# Patient Record
Sex: Female | Born: 1935
Health system: Southern US, Community
[De-identification: ages and names within clinical notes are randomized; demographics above are authoritative.]

## PROBLEM LIST (undated history)

## (undated) DIAGNOSIS — N182 Chronic kidney disease, stage 2 (mild): Secondary | ICD-10-CM

## (undated) DIAGNOSIS — M4802 Spinal stenosis, cervical region: Secondary | ICD-10-CM

## (undated) DIAGNOSIS — K219 Gastro-esophageal reflux disease without esophagitis: Secondary | ICD-10-CM

## (undated) DIAGNOSIS — Z889 Allergy status to unspecified drugs, medicaments and biological substances status: Secondary | ICD-10-CM

## (undated) DIAGNOSIS — IMO0002 Reserved for concepts with insufficient information to code with codable children: Secondary | ICD-10-CM

## (undated) DIAGNOSIS — K635 Polyp of colon: Secondary | ICD-10-CM

## (undated) DIAGNOSIS — T8859XA Other complications of anesthesia, initial encounter: Secondary | ICD-10-CM

## (undated) DIAGNOSIS — E785 Hyperlipidemia, unspecified: Secondary | ICD-10-CM

## (undated) DIAGNOSIS — G459 Transient cerebral ischemic attack, unspecified: Secondary | ICD-10-CM

## (undated) DIAGNOSIS — C4491 Basal cell carcinoma of skin, unspecified: Secondary | ICD-10-CM

## (undated) DIAGNOSIS — E119 Type 2 diabetes mellitus without complications: Secondary | ICD-10-CM

## (undated) DIAGNOSIS — I2510 Atherosclerotic heart disease of native coronary artery without angina pectoris: Secondary | ICD-10-CM

## (undated) DIAGNOSIS — T4145XA Adverse effect of unspecified anesthetic, initial encounter: Secondary | ICD-10-CM

## (undated) DIAGNOSIS — I10 Essential (primary) hypertension: Secondary | ICD-10-CM

## (undated) DIAGNOSIS — I639 Cerebral infarction, unspecified: Secondary | ICD-10-CM

## (undated) HISTORY — DX: Atherosclerotic heart disease of native coronary artery without angina pectoris: I25.10

## (undated) HISTORY — PX: TUBAL LIGATION: SHX77

## (undated) HISTORY — PX: BASAL CELL CARCINOMA EXCISION: SHX1214

## (undated) HISTORY — DX: Allergy status to unspecified drugs, medicaments and biological substances: Z88.9

## (undated) HISTORY — DX: Hyperlipidemia, unspecified: E78.5

## (undated) HISTORY — DX: Spinal stenosis, cervical region: M48.02

## (undated) HISTORY — PX: APPENDECTOMY: SHX54

## (undated) HISTORY — PX: SQUAMOUS CELL CARCINOMA EXCISION: SHX2433

## (undated) HISTORY — DX: Polyp of colon: K63.5

## (undated) HISTORY — PX: MOHS SURGERY: SUR867

## (undated) HISTORY — DX: Gastro-esophageal reflux disease without esophagitis: K21.9

## (undated) HISTORY — DX: Essential (primary) hypertension: I10

---

## 1990-07-07 HISTORY — PX: BREAST BIOPSY: SHX20

## 1999-11-05 HISTORY — PX: OTHER SURGICAL HISTORY: SHX169

## 2000-03-03 ENCOUNTER — Encounter: Payer: Self-pay | Admitting: Family Medicine

## 2000-03-03 ENCOUNTER — Encounter: Admission: RE | Admit: 2000-03-03 | Discharge: 2000-03-03 | Payer: Self-pay | Admitting: Family Medicine

## 2000-03-07 HISTORY — PX: CYSTOSCOPY W/ DECANNULATION: SHX1423

## 2001-04-06 ENCOUNTER — Encounter: Payer: Self-pay | Admitting: Family Medicine

## 2001-04-06 ENCOUNTER — Encounter: Admission: RE | Admit: 2001-04-06 | Discharge: 2001-04-06 | Payer: Self-pay | Admitting: Family Medicine

## 2001-06-06 ENCOUNTER — Encounter: Payer: Self-pay | Admitting: Family Medicine

## 2001-06-06 LAB — CONVERTED CEMR LAB

## 2001-06-07 ENCOUNTER — Other Ambulatory Visit: Admission: RE | Admit: 2001-06-07 | Discharge: 2001-06-07 | Payer: Self-pay | Admitting: Family Medicine

## 2001-06-25 LAB — FECAL OCCULT BLOOD, GUAIAC: Fecal Occult Blood: NEGATIVE

## 2002-03-14 ENCOUNTER — Ambulatory Visit (HOSPITAL_COMMUNITY): Admission: RE | Admit: 2002-03-14 | Discharge: 2002-03-14 | Payer: Self-pay | Admitting: Family Medicine

## 2002-05-25 ENCOUNTER — Encounter: Admission: RE | Admit: 2002-05-25 | Discharge: 2002-05-25 | Payer: Self-pay | Admitting: Family Medicine

## 2002-05-25 ENCOUNTER — Encounter: Payer: Self-pay | Admitting: Family Medicine

## 2003-07-08 HISTORY — PX: OTHER SURGICAL HISTORY: SHX169

## 2003-07-08 HISTORY — PX: TEAR DUCT PROBING: SHX793

## 2003-08-14 ENCOUNTER — Encounter: Admission: RE | Admit: 2003-08-14 | Discharge: 2003-08-14 | Payer: Self-pay | Admitting: Family Medicine

## 2004-06-11 ENCOUNTER — Ambulatory Visit: Payer: Self-pay | Admitting: Family Medicine

## 2004-07-10 ENCOUNTER — Ambulatory Visit: Payer: Self-pay | Admitting: Family Medicine

## 2004-09-11 ENCOUNTER — Encounter: Admission: RE | Admit: 2004-09-11 | Discharge: 2004-09-11 | Payer: Self-pay | Admitting: Family Medicine

## 2004-12-19 ENCOUNTER — Encounter: Admission: RE | Admit: 2004-12-19 | Discharge: 2004-12-19 | Payer: Self-pay | Admitting: Otolaryngology

## 2005-01-04 HISTORY — PX: NASAL SINUS SURGERY: SHX719

## 2005-01-09 ENCOUNTER — Ambulatory Visit: Payer: Self-pay | Admitting: Family Medicine

## 2005-01-10 ENCOUNTER — Ambulatory Visit (HOSPITAL_BASED_OUTPATIENT_CLINIC_OR_DEPARTMENT_OTHER): Admission: RE | Admit: 2005-01-10 | Discharge: 2005-01-10 | Payer: Self-pay | Admitting: Otolaryngology

## 2005-01-10 ENCOUNTER — Ambulatory Visit (HOSPITAL_COMMUNITY): Admission: RE | Admit: 2005-01-10 | Discharge: 2005-01-10 | Payer: Self-pay | Admitting: Otolaryngology

## 2005-01-10 ENCOUNTER — Encounter (INDEPENDENT_AMBULATORY_CARE_PROVIDER_SITE_OTHER): Payer: Self-pay | Admitting: *Deleted

## 2005-01-16 ENCOUNTER — Ambulatory Visit: Payer: Self-pay | Admitting: Family Medicine

## 2005-04-22 ENCOUNTER — Ambulatory Visit: Payer: Self-pay | Admitting: Family Medicine

## 2005-04-24 ENCOUNTER — Ambulatory Visit: Payer: Self-pay | Admitting: Family Medicine

## 2005-07-24 ENCOUNTER — Ambulatory Visit: Payer: Self-pay | Admitting: Family Medicine

## 2005-07-29 ENCOUNTER — Ambulatory Visit: Payer: Self-pay | Admitting: Family Medicine

## 2005-08-11 ENCOUNTER — Ambulatory Visit: Payer: Self-pay | Admitting: Family Medicine

## 2005-08-13 ENCOUNTER — Encounter: Admission: RE | Admit: 2005-08-13 | Discharge: 2005-08-13 | Payer: Self-pay | Admitting: Otolaryngology

## 2005-08-26 ENCOUNTER — Ambulatory Visit (HOSPITAL_BASED_OUTPATIENT_CLINIC_OR_DEPARTMENT_OTHER): Admission: RE | Admit: 2005-08-26 | Discharge: 2005-08-26 | Payer: Self-pay | Admitting: Otolaryngology

## 2005-08-26 ENCOUNTER — Encounter (INDEPENDENT_AMBULATORY_CARE_PROVIDER_SITE_OTHER): Payer: Self-pay | Admitting: Specialist

## 2005-09-04 ENCOUNTER — Ambulatory Visit: Payer: Self-pay | Admitting: Family Medicine

## 2005-09-09 ENCOUNTER — Ambulatory Visit: Payer: Self-pay | Admitting: Family Medicine

## 2005-09-09 ENCOUNTER — Encounter: Admission: RE | Admit: 2005-09-09 | Discharge: 2005-09-09 | Payer: Self-pay | Admitting: Family Medicine

## 2005-09-22 ENCOUNTER — Ambulatory Visit: Payer: Self-pay | Admitting: Family Medicine

## 2005-09-23 ENCOUNTER — Ambulatory Visit: Payer: Self-pay | Admitting: Family Medicine

## 2005-10-01 ENCOUNTER — Encounter: Admission: RE | Admit: 2005-10-01 | Discharge: 2005-10-01 | Payer: Self-pay | Admitting: General Surgery

## 2005-10-05 ENCOUNTER — Ambulatory Visit: Payer: Self-pay | Admitting: Family Medicine

## 2005-11-04 ENCOUNTER — Ambulatory Visit: Payer: Self-pay | Admitting: Family Medicine

## 2005-11-04 ENCOUNTER — Encounter: Admission: RE | Admit: 2005-11-04 | Discharge: 2005-11-04 | Payer: Self-pay | Admitting: Family Medicine

## 2005-11-10 ENCOUNTER — Ambulatory Visit: Payer: Self-pay | Admitting: Family Medicine

## 2006-02-13 ENCOUNTER — Ambulatory Visit: Payer: Self-pay | Admitting: Family Medicine

## 2006-06-10 ENCOUNTER — Ambulatory Visit: Payer: Self-pay | Admitting: Family Medicine

## 2006-08-18 ENCOUNTER — Ambulatory Visit: Payer: Self-pay | Admitting: Family Medicine

## 2006-10-09 ENCOUNTER — Encounter: Payer: Self-pay | Admitting: Family Medicine

## 2006-10-09 DIAGNOSIS — K219 Gastro-esophageal reflux disease without esophagitis: Secondary | ICD-10-CM | POA: Insufficient documentation

## 2006-10-09 DIAGNOSIS — N3941 Urge incontinence: Secondary | ICD-10-CM | POA: Insufficient documentation

## 2006-10-09 DIAGNOSIS — N318 Other neuromuscular dysfunction of bladder: Secondary | ICD-10-CM | POA: Insufficient documentation

## 2006-10-09 DIAGNOSIS — Z85828 Personal history of other malignant neoplasm of skin: Secondary | ICD-10-CM | POA: Insufficient documentation

## 2006-10-16 ENCOUNTER — Ambulatory Visit: Payer: Self-pay | Admitting: Family Medicine

## 2006-10-16 LAB — CONVERTED CEMR LAB
ALT: 16 units/L (ref 0–40)
AST: 18 units/L (ref 0–37)
Cholesterol: 196 mg/dL (ref 0–200)
Creatinine,U: 235 mg/dL
HDL: 38.3 mg/dL — ABNORMAL LOW (ref >39.0)
Hgb A1c MFr Bld: 6.8 % — ABNORMAL HIGH (ref 4.6–6.0)
LDL Cholesterol: 131 mg/dL — ABNORMAL HIGH (ref 0–99)
Microalb Creat Ratio: 3.4 mg/g (ref 0.0–30.0)
Microalb, Ur: 0.8 mg/dL (ref 0.0–1.9)
Total CHOL/HDL Ratio: 5.1
Triglycerides: 133 mg/dL (ref 0–149)
VLDL: 27 mg/dL (ref 0–40)

## 2006-11-18 ENCOUNTER — Encounter: Admission: RE | Admit: 2006-11-18 | Discharge: 2006-11-18 | Payer: Self-pay | Admitting: Family Medicine

## 2006-11-20 ENCOUNTER — Encounter (INDEPENDENT_AMBULATORY_CARE_PROVIDER_SITE_OTHER): Payer: Self-pay | Admitting: *Deleted

## 2006-11-24 ENCOUNTER — Ambulatory Visit: Payer: Self-pay | Admitting: Family Medicine

## 2006-11-24 DIAGNOSIS — E1169 Type 2 diabetes mellitus with other specified complication: Secondary | ICD-10-CM | POA: Insufficient documentation

## 2006-11-24 DIAGNOSIS — I10 Essential (primary) hypertension: Secondary | ICD-10-CM | POA: Insufficient documentation

## 2006-11-24 DIAGNOSIS — E785 Hyperlipidemia, unspecified: Secondary | ICD-10-CM

## 2007-01-25 ENCOUNTER — Ambulatory Visit: Payer: Self-pay | Admitting: Family Medicine

## 2007-01-26 LAB — CONVERTED CEMR LAB
ALT: 15 units/L (ref 0–35)
AST: 15 units/L (ref 0–37)
Cholesterol: 198 mg/dL (ref 0–200)
HDL: 35.6 mg/dL — ABNORMAL LOW (ref >39.0)
Hgb A1c MFr Bld: 6.5 % — ABNORMAL HIGH (ref 4.6–6.0)
LDL Cholesterol: 134 mg/dL — ABNORMAL HIGH (ref 0–99)
Total CHOL/HDL Ratio: 5.6
Triglycerides: 144 mg/dL (ref 0–149)
VLDL: 29 mg/dL (ref 0–40)

## 2007-04-27 ENCOUNTER — Ambulatory Visit: Payer: Self-pay | Admitting: Family Medicine

## 2007-04-30 LAB — CONVERTED CEMR LAB
ALT: 16 units/L (ref 0–35)
AST: 18 units/L (ref 0–37)
Albumin: 3.4 g/dL — ABNORMAL LOW (ref 3.5–5.2)
BUN: 10 mg/dL (ref 6–23)
CO2: 32 meq/L (ref 19–32)
Calcium: 9.1 mg/dL (ref 8.4–10.5)
Chloride: 108 meq/L (ref 96–112)
Cholesterol: 201 mg/dL (ref 0–200)
Creatinine, Ser: 0.8 mg/dL (ref 0.4–1.2)
Creatinine,U: 133.7 mg/dL
Direct LDL: 143 mg/dL
GFR calc Af Amer: 91 mL/min
GFR calc non Af Amer: 76 mL/min
Glucose, Bld: 117 mg/dL — ABNORMAL HIGH (ref 70–99)
HDL: 39 mg/dL (ref >39.0)
Hgb A1c MFr Bld: 6.6 % — ABNORMAL HIGH (ref 4.6–6.0)
Microalb Creat Ratio: 4.5 mg/g (ref 0.0–30.0)
Microalb, Ur: 0.6 mg/dL (ref 0.0–1.9)
Phosphorus: 4.2 mg/dL (ref 2.3–4.6)
Potassium: 4.1 meq/L (ref 3.5–5.1)
Sodium: 144 meq/L (ref 135–145)
Total CHOL/HDL Ratio: 5.2
Triglycerides: 86 mg/dL (ref 0–149)
VLDL: 17 mg/dL (ref 0–40)

## 2007-05-21 ENCOUNTER — Emergency Department (HOSPITAL_COMMUNITY): Admission: EM | Admit: 2007-05-21 | Discharge: 2007-05-21 | Payer: Self-pay | Admitting: Emergency Medicine

## 2007-07-08 DIAGNOSIS — K635 Polyp of colon: Secondary | ICD-10-CM

## 2007-07-08 HISTORY — DX: Polyp of colon: K63.5

## 2007-08-04 ENCOUNTER — Ambulatory Visit: Payer: Self-pay | Admitting: Family Medicine

## 2007-08-05 LAB — CONVERTED CEMR LAB
ALT: 14 units/L (ref 0–35)
AST: 14 units/L (ref 0–37)
Albumin: 3.5 g/dL (ref 3.5–5.2)
BUN: 9 mg/dL (ref 6–23)
CO2: 31 meq/L (ref 19–32)
Calcium: 9.2 mg/dL (ref 8.4–10.5)
Chloride: 105 meq/L (ref 96–112)
Cholesterol: 192 mg/dL (ref 0–200)
Creatinine, Ser: 0.9 mg/dL (ref 0.4–1.2)
GFR calc Af Amer: 80 mL/min
GFR calc non Af Amer: 66 mL/min
Glucose, Bld: 121 mg/dL — ABNORMAL HIGH (ref 70–99)
HDL: 37.9 mg/dL — ABNORMAL LOW (ref >39.0)
Hgb A1c MFr Bld: 6.6 % — ABNORMAL HIGH (ref 4.6–6.0)
LDL Cholesterol: 131 mg/dL — ABNORMAL HIGH (ref 0–99)
Phosphorus: 3.5 mg/dL (ref 2.3–4.6)
Potassium: 4.2 meq/L (ref 3.5–5.1)
Sodium: 141 meq/L (ref 135–145)
Total CHOL/HDL Ratio: 5.1
Triglycerides: 114 mg/dL (ref 0–149)
VLDL: 23 mg/dL (ref 0–40)

## 2007-08-09 ENCOUNTER — Telehealth (INDEPENDENT_AMBULATORY_CARE_PROVIDER_SITE_OTHER): Payer: Self-pay | Admitting: *Deleted

## 2007-11-03 ENCOUNTER — Ambulatory Visit: Payer: Self-pay | Admitting: Family Medicine

## 2007-11-03 DIAGNOSIS — M545 Low back pain, unspecified: Secondary | ICD-10-CM | POA: Insufficient documentation

## 2007-11-08 ENCOUNTER — Encounter: Payer: Self-pay | Admitting: Family Medicine

## 2007-11-08 ENCOUNTER — Ambulatory Visit: Payer: Self-pay | Admitting: Family Medicine

## 2007-11-08 ENCOUNTER — Other Ambulatory Visit: Admission: RE | Admit: 2007-11-08 | Discharge: 2007-11-08 | Payer: Self-pay | Admitting: Family Medicine

## 2007-11-12 ENCOUNTER — Encounter (INDEPENDENT_AMBULATORY_CARE_PROVIDER_SITE_OTHER): Payer: Self-pay | Admitting: *Deleted

## 2007-11-12 LAB — CONVERTED CEMR LAB: Pap Smear: NORMAL

## 2007-11-26 ENCOUNTER — Ambulatory Visit: Payer: Self-pay | Admitting: Gastroenterology

## 2007-11-30 ENCOUNTER — Encounter: Payer: Self-pay | Admitting: Gastroenterology

## 2007-11-30 LAB — CONVERTED CEMR LAB
BUN: 11 mg/dL (ref 6–23)
Basophils Absolute: 0.1 10*3/uL (ref 0.0–0.1)
Basophils Relative: 1 % (ref 0.0–1.0)
CO2: 30 meq/L (ref 19–32)
Calcium: 9.4 mg/dL (ref 8.4–10.5)
Chloride: 107 meq/L (ref 96–112)
Creatinine, Ser: 0.8 mg/dL (ref 0.4–1.2)
Eosinophils Absolute: 0.3 10*3/uL (ref 0.0–0.7)
Eosinophils Relative: 4.2 % (ref 0.0–5.0)
GFR calc Af Amer: 91 mL/min
GFR calc non Af Amer: 75 mL/min
Glucose, Bld: 76 mg/dL (ref 70–99)
HCT: 42.6 % (ref 36.0–46.0)
Hemoglobin: 14.3 g/dL (ref 12.0–15.0)
Lymphocytes Relative: 37.5 % (ref 12.0–46.0)
MCHC: 33.5 g/dL (ref 30.0–36.0)
MCV: 92.3 fL (ref 78.0–100.0)
Monocytes Absolute: 0.3 10*3/uL (ref 0.1–1.0)
Monocytes Relative: 3.7 % (ref 3.0–12.0)
Neutro Abs: 3.7 10*3/uL (ref 1.4–7.7)
Neutrophils Relative %: 53.6 % (ref 43.0–77.0)
Platelets: 254 10*3/uL (ref 150–400)
Potassium: 4.4 meq/L (ref 3.5–5.1)
RBC: 4.62 M/uL (ref 3.87–5.11)
RDW: 12.7 % (ref 11.5–14.6)
Sodium: 138 meq/L (ref 135–145)
WBC: 7 10*3/uL (ref 4.5–10.5)

## 2007-12-03 ENCOUNTER — Ambulatory Visit: Payer: Self-pay | Admitting: Cardiology

## 2007-12-06 ENCOUNTER — Encounter: Payer: Self-pay | Admitting: Gastroenterology

## 2007-12-06 HISTORY — PX: COLONOSCOPY: SHX174

## 2007-12-09 ENCOUNTER — Encounter: Admission: RE | Admit: 2007-12-09 | Discharge: 2007-12-09 | Payer: Self-pay | Admitting: Family Medicine

## 2007-12-13 ENCOUNTER — Encounter (INDEPENDENT_AMBULATORY_CARE_PROVIDER_SITE_OTHER): Payer: Self-pay | Admitting: *Deleted

## 2007-12-27 ENCOUNTER — Encounter: Payer: Self-pay | Admitting: Family Medicine

## 2007-12-29 ENCOUNTER — Encounter: Payer: Self-pay | Admitting: Gastroenterology

## 2007-12-29 ENCOUNTER — Ambulatory Visit: Payer: Self-pay | Admitting: Gastroenterology

## 2007-12-29 LAB — HM COLONOSCOPY

## 2007-12-30 ENCOUNTER — Encounter: Payer: Self-pay | Admitting: Gastroenterology

## 2008-02-07 ENCOUNTER — Encounter: Payer: Self-pay | Admitting: Family Medicine

## 2008-02-14 ENCOUNTER — Ambulatory Visit: Payer: Self-pay | Admitting: Family Medicine

## 2008-02-14 DIAGNOSIS — J309 Allergic rhinitis, unspecified: Secondary | ICD-10-CM | POA: Insufficient documentation

## 2008-05-23 ENCOUNTER — Ambulatory Visit: Payer: Self-pay | Admitting: Family Medicine

## 2008-05-23 ENCOUNTER — Telehealth: Payer: Self-pay | Admitting: Family Medicine

## 2008-05-26 ENCOUNTER — Telehealth: Payer: Self-pay | Admitting: Family Medicine

## 2008-05-29 ENCOUNTER — Telehealth: Payer: Self-pay | Admitting: Family Medicine

## 2008-06-21 ENCOUNTER — Ambulatory Visit: Payer: Self-pay | Admitting: Family Medicine

## 2008-06-25 ENCOUNTER — Emergency Department (HOSPITAL_COMMUNITY): Admission: EM | Admit: 2008-06-25 | Discharge: 2008-06-25 | Payer: Self-pay | Admitting: Emergency Medicine

## 2008-06-25 ENCOUNTER — Encounter: Payer: Self-pay | Admitting: Family Medicine

## 2008-06-26 ENCOUNTER — Telehealth: Payer: Self-pay | Admitting: Family Medicine

## 2008-07-12 ENCOUNTER — Ambulatory Visit: Payer: Self-pay | Admitting: Family Medicine

## 2008-07-27 ENCOUNTER — Ambulatory Visit: Payer: Self-pay | Admitting: Family Medicine

## 2008-08-08 ENCOUNTER — Ambulatory Visit: Payer: Self-pay | Admitting: Family Medicine

## 2008-08-14 LAB — CONVERTED CEMR LAB
ALT: 15 units/L (ref 0–35)
AST: 16 units/L (ref 0–37)
Albumin: 3.7 g/dL (ref 3.5–5.2)
BUN: 12 mg/dL (ref 6–23)
CO2: 31 meq/L (ref 19–32)
Calcium: 9.3 mg/dL (ref 8.4–10.5)
Chloride: 107 meq/L (ref 96–112)
Cholesterol: 201 mg/dL (ref 0–200)
Creatinine, Ser: 0.7 mg/dL (ref 0.4–1.2)
Direct LDL: 134.7 mg/dL
GFR calc Af Amer: 106 mL/min
GFR calc non Af Amer: 88 mL/min
Glucose, Bld: 132 mg/dL — ABNORMAL HIGH (ref 70–99)
HDL: 41.8 mg/dL (ref >39.0)
Hgb A1c MFr Bld: 6.8 % — ABNORMAL HIGH (ref 4.6–6.0)
Phosphorus: 3.9 mg/dL (ref 2.3–4.6)
Potassium: 4.2 meq/L (ref 3.5–5.1)
Sodium: 142 meq/L (ref 135–145)
Total CHOL/HDL Ratio: 4.8
Triglycerides: 128 mg/dL (ref 0–149)
VLDL: 26 mg/dL (ref 0–40)

## 2008-09-05 ENCOUNTER — Encounter: Payer: Self-pay | Admitting: Family Medicine

## 2008-10-26 ENCOUNTER — Encounter: Payer: Self-pay | Admitting: Family Medicine

## 2008-10-30 ENCOUNTER — Encounter: Payer: Self-pay | Admitting: Family Medicine

## 2008-11-14 ENCOUNTER — Telehealth: Payer: Self-pay | Admitting: Family Medicine

## 2008-11-14 ENCOUNTER — Encounter (INDEPENDENT_AMBULATORY_CARE_PROVIDER_SITE_OTHER): Payer: Self-pay | Admitting: *Deleted

## 2008-11-15 ENCOUNTER — Encounter (INDEPENDENT_AMBULATORY_CARE_PROVIDER_SITE_OTHER): Payer: Self-pay | Admitting: *Deleted

## 2008-11-16 ENCOUNTER — Encounter: Payer: Self-pay | Admitting: Family Medicine

## 2008-12-13 ENCOUNTER — Encounter: Payer: Self-pay | Admitting: Cardiology

## 2008-12-13 ENCOUNTER — Encounter: Payer: Self-pay | Admitting: Family Medicine

## 2008-12-14 ENCOUNTER — Telehealth (INDEPENDENT_AMBULATORY_CARE_PROVIDER_SITE_OTHER): Payer: Self-pay | Admitting: *Deleted

## 2008-12-14 ENCOUNTER — Encounter: Payer: Self-pay | Admitting: Cardiology

## 2008-12-14 ENCOUNTER — Ambulatory Visit: Payer: Self-pay | Admitting: Cardiology

## 2008-12-14 ENCOUNTER — Ambulatory Visit: Payer: Self-pay | Admitting: Family Medicine

## 2008-12-15 LAB — CONVERTED CEMR LAB
ALT: 16 units/L (ref 0–35)
AST: 19 units/L (ref 0–37)
Albumin: 3.4 g/dL — ABNORMAL LOW (ref 3.5–5.2)
BUN: 9 mg/dL (ref 6–23)
Basophils Absolute: 0 10*3/uL (ref 0.0–0.1)
Basophils Relative: 0.8 % (ref 0.0–3.0)
CO2: 30 meq/L (ref 19–32)
Calcium: 8.9 mg/dL (ref 8.4–10.5)
Chloride: 110 meq/L (ref 96–112)
Cholesterol: 195 mg/dL (ref 0–200)
Creatinine, Ser: 0.8 mg/dL (ref 0.4–1.2)
Creatinine,U: 131.7 mg/dL
Eosinophils Absolute: 0.1 10*3/uL (ref 0.0–0.7)
Eosinophils Relative: 2.5 % (ref 0.0–5.0)
Glucose, Bld: 125 mg/dL — ABNORMAL HIGH (ref 70–99)
HCT: 33 % — ABNORMAL LOW (ref 36.0–46.0)
HDL: 43.8 mg/dL (ref >39.00)
Hemoglobin: 11.1 g/dL — ABNORMAL LOW (ref 12.0–15.0)
Hgb A1c MFr Bld: 7.1 % — ABNORMAL HIGH (ref 4.6–6.5)
LDL Cholesterol: 125 mg/dL — ABNORMAL HIGH (ref 0–99)
Lymphocytes Relative: 32 % (ref 12.0–46.0)
Lymphs Abs: 1.6 10*3/uL (ref 0.7–4.0)
MCHC: 33.7 g/dL (ref 30.0–36.0)
MCV: 89.6 fL (ref 78.0–100.0)
Microalb Creat Ratio: 3.8 mg/g (ref 0.0–30.0)
Microalb, Ur: 0.5 mg/dL (ref 0.0–1.9)
Monocytes Absolute: 0.4 10*3/uL (ref 0.1–1.0)
Monocytes Relative: 7.3 % (ref 3.0–12.0)
Neutro Abs: 3 10*3/uL (ref 1.4–7.7)
Neutrophils Relative %: 57.4 % (ref 43.0–77.0)
Phosphorus: 3.7 mg/dL (ref 2.3–4.6)
Platelets: 244 10*3/uL (ref 150.0–400.0)
Potassium: 4.3 meq/L (ref 3.5–5.1)
RBC: 3.68 M/uL — ABNORMAL LOW (ref 3.87–5.11)
RDW: 13.6 % (ref 11.5–14.6)
Sodium: 143 meq/L (ref 135–145)
Total CHOL/HDL Ratio: 4
Triglycerides: 133 mg/dL (ref 0.0–149.0)
VLDL: 26.6 mg/dL (ref 0.0–40.0)
WBC: 5.1 10*3/uL (ref 4.5–10.5)

## 2008-12-18 ENCOUNTER — Ambulatory Visit: Payer: Self-pay | Admitting: Cardiology

## 2008-12-18 ENCOUNTER — Ambulatory Visit: Payer: Self-pay

## 2008-12-19 ENCOUNTER — Encounter: Admission: RE | Admit: 2008-12-19 | Discharge: 2008-12-19 | Payer: Self-pay | Admitting: Family Medicine

## 2008-12-22 ENCOUNTER — Encounter (INDEPENDENT_AMBULATORY_CARE_PROVIDER_SITE_OTHER): Payer: Self-pay | Admitting: *Deleted

## 2009-01-01 ENCOUNTER — Ambulatory Visit: Payer: Self-pay | Admitting: Internal Medicine

## 2009-01-01 ENCOUNTER — Encounter: Payer: Self-pay | Admitting: Cardiology

## 2009-01-05 ENCOUNTER — Ambulatory Visit: Payer: Self-pay | Admitting: Family Medicine

## 2009-01-05 LAB — CONVERTED CEMR LAB
BUN: 14 mg/dL (ref 6–23)
CO2: 24 meq/L (ref 19–32)
Calcium: 9.3 mg/dL (ref 8.4–10.5)
Chloride: 108 meq/L (ref 96–112)
Creatinine, Ser: 0.76 mg/dL (ref 0.40–1.20)
Glucose, Bld: 124 mg/dL — ABNORMAL HIGH (ref 70–99)
OCCULT 1: NEGATIVE
OCCULT 2: NEGATIVE
OCCULT 3: NEGATIVE
Potassium: 4.6 meq/L (ref 3.5–5.3)
Sodium: 144 meq/L (ref 135–145)

## 2009-02-13 ENCOUNTER — Ambulatory Visit: Payer: Self-pay | Admitting: Family Medicine

## 2009-02-13 DIAGNOSIS — D649 Anemia, unspecified: Secondary | ICD-10-CM | POA: Insufficient documentation

## 2009-02-16 LAB — CONVERTED CEMR LAB
Basophils Absolute: 0 10*3/uL (ref 0.0–0.1)
Basophils Relative: 0.7 % (ref 0.0–3.0)
Eosinophils Absolute: 0.4 10*3/uL (ref 0.0–0.7)
Eosinophils Relative: 7.3 % — ABNORMAL HIGH (ref 0.0–5.0)
Ferritin: 27.8 ng/mL (ref 10.0–291.0)
Folate: 12.2 ng/mL
HCT: 37.8 % (ref 36.0–46.0)
Hemoglobin: 12.8 g/dL (ref 12.0–15.0)
Iron: 26 ug/dL — ABNORMAL LOW (ref 42–145)
Lymphocytes Relative: 40.1 % (ref 12.0–46.0)
Lymphs Abs: 2.4 10*3/uL (ref 0.7–4.0)
MCHC: 33.7 g/dL (ref 30.0–36.0)
MCV: 87.2 fL (ref 78.0–100.0)
Monocytes Absolute: 0.6 10*3/uL (ref 0.1–1.0)
Monocytes Relative: 10.5 % (ref 3.0–12.0)
Neutro Abs: 2.7 10*3/uL (ref 1.4–7.7)
Neutrophils Relative %: 41.4 % — ABNORMAL LOW (ref 43.0–77.0)
Platelets: 225 10*3/uL (ref 150.0–400.0)
RBC: 4.34 M/uL (ref 3.87–5.11)
RDW: 13.7 % (ref 11.5–14.6)
Saturation Ratios: 9.2 % — ABNORMAL LOW (ref 20.0–50.0)
Transferrin: 201 mg/dL — ABNORMAL LOW (ref 212.0–360.0)
Vitamin B-12: 251 pg/mL (ref 211–911)
WBC: 6.1 10*3/uL (ref 4.5–10.5)

## 2009-02-19 ENCOUNTER — Ambulatory Visit: Payer: Self-pay | Admitting: Family Medicine

## 2009-02-27 ENCOUNTER — Encounter: Payer: Self-pay | Admitting: Family Medicine

## 2009-03-02 ENCOUNTER — Telehealth: Payer: Self-pay | Admitting: Family Medicine

## 2009-03-06 ENCOUNTER — Ambulatory Visit: Payer: Self-pay | Admitting: Family Medicine

## 2009-03-06 DIAGNOSIS — E538 Deficiency of other specified B group vitamins: Secondary | ICD-10-CM | POA: Insufficient documentation

## 2009-03-22 ENCOUNTER — Ambulatory Visit: Payer: Self-pay | Admitting: Family Medicine

## 2009-04-08 ENCOUNTER — Encounter: Payer: Self-pay | Admitting: Family Medicine

## 2009-04-08 ENCOUNTER — Telehealth: Payer: Self-pay | Admitting: Internal Medicine

## 2009-04-08 ENCOUNTER — Emergency Department: Payer: Self-pay | Admitting: Emergency Medicine

## 2009-04-19 ENCOUNTER — Ambulatory Visit: Payer: Self-pay | Admitting: Family Medicine

## 2009-04-19 DIAGNOSIS — R55 Syncope and collapse: Secondary | ICD-10-CM | POA: Insufficient documentation

## 2009-05-01 ENCOUNTER — Ambulatory Visit: Payer: Self-pay | Admitting: Family Medicine

## 2009-06-02 ENCOUNTER — Telehealth: Payer: Self-pay | Admitting: Family Medicine

## 2009-06-04 ENCOUNTER — Ambulatory Visit: Payer: Self-pay | Admitting: Family Medicine

## 2009-06-05 LAB — CONVERTED CEMR LAB
Albumin: 3.7 g/dL (ref 3.5–5.2)
BUN: 11 mg/dL (ref 6–23)
Basophils Absolute: 0.1 10*3/uL (ref 0.0–0.1)
Basophils Relative: 2.2 % (ref 0.0–3.0)
CO2: 29 meq/L (ref 19–32)
Calcium: 9 mg/dL (ref 8.4–10.5)
Chloride: 103 meq/L (ref 96–112)
Cholesterol: 147 mg/dL (ref 0–200)
Creatinine, Ser: 0.8 mg/dL (ref 0.4–1.2)
Eosinophils Absolute: 0.2 10*3/uL (ref 0.0–0.7)
Eosinophils Relative: 2.9 % (ref 0.0–5.0)
GFR calc non Af Amer: 75.01 mL/min (ref >60)
Glucose, Bld: 214 mg/dL — ABNORMAL HIGH (ref 70–99)
HCT: 36.9 % (ref 36.0–46.0)
HDL: 43.4 mg/dL (ref >39.00)
Hemoglobin: 12.2 g/dL (ref 12.0–15.0)
Hgb A1c MFr Bld: 7.1 % — ABNORMAL HIGH (ref 4.6–6.5)
Iron: 62 ug/dL (ref 42–145)
LDL Cholesterol: 74 mg/dL (ref 0–99)
Lymphocytes Relative: 27 % (ref 12.0–46.0)
Lymphs Abs: 1.6 10*3/uL (ref 0.7–4.0)
MCHC: 33.1 g/dL (ref 30.0–36.0)
MCV: 89.8 fL (ref 78.0–100.0)
Monocytes Absolute: 0.3 10*3/uL (ref 0.1–1.0)
Monocytes Relative: 5.1 % (ref 3.0–12.0)
Neutro Abs: 3.7 10*3/uL (ref 1.4–7.7)
Neutrophils Relative %: 62.8 % (ref 43.0–77.0)
Phosphorus: 3.5 mg/dL (ref 2.3–4.6)
Platelets: 233 10*3/uL (ref 150.0–400.0)
Potassium: 4 meq/L (ref 3.5–5.1)
RBC: 4.11 M/uL (ref 3.87–5.11)
RDW: 13.8 % (ref 11.5–14.6)
Saturation Ratios: 20.8 % (ref 20.0–50.0)
Sodium: 139 meq/L (ref 135–145)
Total CHOL/HDL Ratio: 3
Transferrin: 213 mg/dL (ref 212.0–360.0)
Triglycerides: 148 mg/dL (ref 0.0–149.0)
VLDL: 29.6 mg/dL (ref 0.0–40.0)
Vitamin B-12: 284 pg/mL (ref 211–911)
WBC: 5.9 10*3/uL (ref 4.5–10.5)

## 2009-06-06 ENCOUNTER — Ambulatory Visit: Payer: Self-pay | Admitting: Family Medicine

## 2009-06-13 ENCOUNTER — Ambulatory Visit: Payer: Self-pay | Admitting: Family Medicine

## 2009-06-20 ENCOUNTER — Ambulatory Visit: Payer: Self-pay | Admitting: Family Medicine

## 2009-06-20 ENCOUNTER — Telehealth: Payer: Self-pay | Admitting: Family Medicine

## 2009-06-27 ENCOUNTER — Ambulatory Visit: Payer: Self-pay | Admitting: Family Medicine

## 2009-07-03 ENCOUNTER — Ambulatory Visit: Payer: Self-pay | Admitting: Family Medicine

## 2009-07-03 ENCOUNTER — Telehealth: Payer: Self-pay | Admitting: Family Medicine

## 2009-07-08 LAB — CONVERTED CEMR LAB: Vitamin B-12: 866 pg/mL (ref 211–911)

## 2009-07-23 ENCOUNTER — Encounter: Payer: Self-pay | Admitting: Family Medicine

## 2009-07-31 ENCOUNTER — Ambulatory Visit: Payer: Self-pay | Admitting: Family Medicine

## 2009-07-31 ENCOUNTER — Telehealth: Payer: Self-pay | Admitting: Family Medicine

## 2009-08-06 ENCOUNTER — Encounter: Payer: Self-pay | Admitting: Family Medicine

## 2009-08-06 ENCOUNTER — Encounter: Admission: RE | Admit: 2009-08-06 | Discharge: 2009-08-06 | Payer: Self-pay | Admitting: Neurology

## 2009-08-10 ENCOUNTER — Encounter: Admission: RE | Admit: 2009-08-10 | Discharge: 2009-08-10 | Payer: Self-pay | Admitting: Neurology

## 2009-08-10 ENCOUNTER — Encounter: Payer: Self-pay | Admitting: Family Medicine

## 2009-08-28 ENCOUNTER — Ambulatory Visit: Payer: Self-pay | Admitting: Family Medicine

## 2009-08-29 LAB — CONVERTED CEMR LAB
Albumin: 3.7 g/dL (ref 3.5–5.2)
BUN: 15 mg/dL (ref 6–23)
CO2: 28 meq/L (ref 19–32)
Calcium: 9.1 mg/dL (ref 8.4–10.5)
Chloride: 108 meq/L (ref 96–112)
Creatinine, Ser: 0.8 mg/dL (ref 0.4–1.2)
GFR calc non Af Amer: 74.96 mL/min (ref >60)
Glucose, Bld: 125 mg/dL — ABNORMAL HIGH (ref 70–99)
Hgb A1c MFr Bld: 7 % — ABNORMAL HIGH (ref 4.6–6.5)
Phosphorus: 3.8 mg/dL (ref 2.3–4.6)
Potassium: 4.2 meq/L (ref 3.5–5.1)
Sodium: 142 meq/L (ref 135–145)

## 2009-09-03 ENCOUNTER — Ambulatory Visit: Payer: Self-pay | Admitting: Family Medicine

## 2009-09-03 ENCOUNTER — Telehealth: Payer: Self-pay | Admitting: Gastroenterology

## 2009-09-04 ENCOUNTER — Ambulatory Visit: Payer: Self-pay | Admitting: Internal Medicine

## 2009-09-04 DIAGNOSIS — Z860100 Personal history of colon polyps, unspecified: Secondary | ICD-10-CM | POA: Insufficient documentation

## 2009-09-11 ENCOUNTER — Encounter: Payer: Self-pay | Admitting: Family Medicine

## 2009-09-13 LAB — HM DIABETES EYE EXAM: HM Diabetic Eye Exam: NORMAL

## 2009-10-01 ENCOUNTER — Encounter: Payer: Self-pay | Admitting: Family Medicine

## 2009-10-11 ENCOUNTER — Telehealth: Payer: Self-pay | Admitting: Family Medicine

## 2009-10-12 ENCOUNTER — Telehealth: Payer: Self-pay | Admitting: Family Medicine

## 2009-10-15 ENCOUNTER — Telehealth: Payer: Self-pay | Admitting: Family Medicine

## 2009-11-27 ENCOUNTER — Ambulatory Visit: Payer: Self-pay | Admitting: Family Medicine

## 2009-11-27 LAB — CONVERTED CEMR LAB
ALT: 16 units/L (ref 0–35)
AST: 17 units/L (ref 0–37)
Albumin: 3.9 g/dL (ref 3.5–5.2)
BUN: 12 mg/dL (ref 6–23)
CO2: 29 meq/L (ref 19–32)
Calcium: 9 mg/dL (ref 8.4–10.5)
Chloride: 107 meq/L (ref 96–112)
Cholesterol: 150 mg/dL (ref 0–200)
Creatinine, Ser: 0.7 mg/dL (ref 0.4–1.2)
GFR calc non Af Amer: 85.97 mL/min (ref >60)
Glucose, Bld: 121 mg/dL — ABNORMAL HIGH (ref 70–99)
HDL: 42.2 mg/dL (ref >39.00)
Hgb A1c MFr Bld: 6.6 % — ABNORMAL HIGH (ref 4.6–6.5)
LDL Cholesterol: 79 mg/dL (ref 0–99)
Phosphorus: 3.6 mg/dL (ref 2.3–4.6)
Potassium: 4.5 meq/L (ref 3.5–5.1)
Sodium: 141 meq/L (ref 135–145)
Total CHOL/HDL Ratio: 4
Triglycerides: 145 mg/dL (ref 0.0–149.0)
VLDL: 29 mg/dL (ref 0.0–40.0)
Vitamin B-12: 294 pg/mL (ref 211–911)

## 2009-12-04 ENCOUNTER — Ambulatory Visit: Payer: Self-pay | Admitting: Family Medicine

## 2009-12-25 ENCOUNTER — Emergency Department: Payer: Self-pay | Admitting: Emergency Medicine

## 2009-12-26 ENCOUNTER — Telehealth: Payer: Self-pay | Admitting: Family Medicine

## 2010-01-01 ENCOUNTER — Telehealth: Payer: Self-pay | Admitting: Family Medicine

## 2010-01-02 ENCOUNTER — Telehealth: Payer: Self-pay | Admitting: Family Medicine

## 2010-01-02 ENCOUNTER — Encounter: Admission: RE | Admit: 2010-01-02 | Discharge: 2010-01-02 | Payer: Self-pay | Admitting: Family Medicine

## 2010-01-02 ENCOUNTER — Ambulatory Visit: Payer: Self-pay | Admitting: Family Medicine

## 2010-01-02 LAB — HM MAMMOGRAPHY: HM Mammogram: NEGATIVE

## 2010-01-03 ENCOUNTER — Encounter (INDEPENDENT_AMBULATORY_CARE_PROVIDER_SITE_OTHER): Payer: Self-pay | Admitting: *Deleted

## 2010-02-12 ENCOUNTER — Ambulatory Visit: Payer: Self-pay | Admitting: Family Medicine

## 2010-02-26 ENCOUNTER — Encounter: Payer: Self-pay | Admitting: Family Medicine

## 2010-03-25 ENCOUNTER — Telehealth: Payer: Self-pay | Admitting: Family Medicine

## 2010-04-02 ENCOUNTER — Ambulatory Visit: Payer: Self-pay | Admitting: Family Medicine

## 2010-04-02 DIAGNOSIS — N390 Urinary tract infection, site not specified: Secondary | ICD-10-CM | POA: Insufficient documentation

## 2010-04-02 LAB — CONVERTED CEMR LAB
Bilirubin Urine: NEGATIVE
Casts: 0 /lpf
Glucose, Urine, Semiquant: NEGATIVE
Ketones, urine, test strip: NEGATIVE
Nitrite: NEGATIVE
Protein, U semiquant: NEGATIVE
Specific Gravity, Urine: >=1.03
Urine crystals, microscopic: 0 /hpf
Urobilinogen, UA: 0.2
Yeast, UA: 0
pH: 6

## 2010-04-03 ENCOUNTER — Encounter: Payer: Self-pay | Admitting: Family Medicine

## 2010-05-08 LAB — CONVERTED CEMR LAB: Pap Smear: NORMAL

## 2010-05-27 ENCOUNTER — Ambulatory Visit: Payer: Self-pay | Admitting: Family Medicine

## 2010-05-28 LAB — CONVERTED CEMR LAB
ALT: 14 units/L (ref 0–35)
AST: 13 units/L (ref 0–37)
Albumin: 3.8 g/dL (ref 3.5–5.2)
BUN: 13 mg/dL (ref 6–23)
CO2: 29 meq/L (ref 19–32)
Calcium: 9 mg/dL (ref 8.4–10.5)
Chloride: 104 meq/L (ref 96–112)
Cholesterol: 162 mg/dL (ref 0–200)
Creatinine, Ser: 1 mg/dL (ref 0.4–1.2)
Creatinine,U: 142.5 mg/dL
GFR calc non Af Amer: 59.18 mL/min (ref >60)
Glucose, Bld: 126 mg/dL — ABNORMAL HIGH (ref 70–99)
HDL: 42.2 mg/dL (ref >39.00)
Hgb A1c MFr Bld: 6.6 % — ABNORMAL HIGH (ref 4.6–6.5)
LDL Cholesterol: 89 mg/dL (ref 0–99)
Microalb Creat Ratio: 0.4 mg/g (ref 0.0–30.0)
Microalb, Ur: 0.5 mg/dL (ref 0.0–1.9)
Phosphorus: 3.8 mg/dL (ref 2.3–4.6)
Potassium: 4.3 meq/L (ref 3.5–5.1)
Sodium: 140 meq/L (ref 135–145)
Total CHOL/HDL Ratio: 4
Triglycerides: 155 mg/dL — ABNORMAL HIGH (ref 0.0–149.0)
VLDL: 31 mg/dL (ref 0.0–40.0)
Vitamin B-12: 243 pg/mL (ref 211–911)

## 2010-06-03 ENCOUNTER — Ambulatory Visit: Payer: Self-pay | Admitting: Family Medicine

## 2010-06-03 LAB — HM DIABETES FOOT EXAM

## 2010-06-04 ENCOUNTER — Encounter: Payer: Self-pay | Admitting: Family Medicine

## 2010-07-04 ENCOUNTER — Encounter (INDEPENDENT_AMBULATORY_CARE_PROVIDER_SITE_OTHER): Payer: Self-pay | Admitting: *Deleted

## 2010-07-17 ENCOUNTER — Emergency Department: Payer: Self-pay | Admitting: Emergency Medicine

## 2010-07-19 ENCOUNTER — Ambulatory Visit
Admission: RE | Admit: 2010-07-19 | Discharge: 2010-07-19 | Payer: Self-pay | Source: Home / Self Care | Attending: Family Medicine | Admitting: Family Medicine

## 2010-07-19 ENCOUNTER — Telehealth: Payer: Self-pay | Admitting: Family Medicine

## 2010-07-26 ENCOUNTER — Other Ambulatory Visit: Payer: Self-pay | Admitting: Gastroenterology

## 2010-07-26 ENCOUNTER — Ambulatory Visit
Admission: RE | Admit: 2010-07-26 | Discharge: 2010-07-26 | Payer: Self-pay | Source: Home / Self Care | Attending: Gastroenterology | Admitting: Gastroenterology

## 2010-07-26 DIAGNOSIS — R197 Diarrhea, unspecified: Secondary | ICD-10-CM | POA: Insufficient documentation

## 2010-07-26 LAB — CBC WITH DIFFERENTIAL/PLATELET
Basophils Absolute: 0 10*3/uL (ref 0.0–0.1)
Basophils Relative: 0.5 % (ref 0.0–3.0)
Eosinophils Absolute: 0.2 10*3/uL (ref 0.0–0.7)
Eosinophils Relative: 3.3 % (ref 0.0–5.0)
HCT: 37.5 % (ref 36.0–46.0)
Hemoglobin: 12.8 g/dL (ref 12.0–15.0)
Lymphocytes Relative: 33.2 % (ref 12.0–46.0)
Lymphs Abs: 2.1 10*3/uL (ref 0.7–4.0)
MCHC: 34.1 g/dL (ref 30.0–36.0)
MCV: 92.8 fl (ref 78.0–100.0)
Monocytes Absolute: 0.5 10*3/uL (ref 0.1–1.0)
Monocytes Relative: 7.5 % (ref 3.0–12.0)
Neutro Abs: 3.5 10*3/uL (ref 1.4–7.7)
Neutrophils Relative %: 55.5 % (ref 43.0–77.0)
Platelets: 224 10*3/uL (ref 150.0–400.0)
RBC: 4.04 Mil/uL (ref 3.87–5.11)
RDW: 13.7 % (ref 11.5–14.6)
WBC: 6.3 10*3/uL (ref 4.5–10.5)

## 2010-07-26 LAB — COMPREHENSIVE METABOLIC PANEL
ALT: 15 U/L (ref 0–35)
AST: 17 U/L (ref 0–37)
Albumin: 3.9 g/dL (ref 3.5–5.2)
Alkaline Phosphatase: 67 U/L (ref 39–117)
BUN: 14 mg/dL (ref 6–23)
CO2: 28 mEq/L (ref 19–32)
Calcium: 9.1 mg/dL (ref 8.4–10.5)
Chloride: 108 mEq/L (ref 96–112)
Creatinine, Ser: 0.8 mg/dL (ref 0.4–1.2)
GFR: 73.7 mL/min (ref >60.00)
Glucose, Bld: 116 mg/dL — ABNORMAL HIGH (ref 70–99)
Potassium: 4.6 mEq/L (ref 3.5–5.1)
Sodium: 144 mEq/L (ref 135–145)
Total Bilirubin: 0.3 mg/dL (ref 0.3–1.2)
Total Protein: 6.7 g/dL (ref 6.0–8.3)

## 2010-07-26 LAB — SEDIMENTATION RATE: Sed Rate: 23 mm/hr — ABNORMAL HIGH (ref 0–22)

## 2010-07-26 LAB — TSH: TSH: 2.02 u[IU]/mL (ref 0.35–5.50)

## 2010-08-02 ENCOUNTER — Encounter: Payer: Self-pay | Admitting: Gastroenterology

## 2010-08-03 ENCOUNTER — Encounter: Payer: Self-pay | Admitting: Gastroenterology

## 2010-08-04 LAB — CONVERTED CEMR LAB
BUN: 9 mg/dL (ref 6–23)
CO2: 31 meq/L (ref 19–32)
Calcium: 8.9 mg/dL (ref 8.4–10.5)
Chloride: 114 meq/L — ABNORMAL HIGH (ref 96–112)
Creatinine, Ser: 0.8 mg/dL (ref 0.4–1.2)
GFR calc non Af Amer: 75.11 mL/min (ref >60)
Glucose, Bld: 132 mg/dL — ABNORMAL HIGH (ref 70–99)
Potassium: 5.1 meq/L (ref 3.5–5.1)
Sodium: 147 meq/L — ABNORMAL HIGH (ref 135–145)

## 2010-08-08 NOTE — Assessment & Plan Note (Signed)
Summary: VIT B12/ Joleah Kosak   Nurse Visit   Allergies: 1)  ! Codeine 2)  ! Relafen 3)  ! * Zetia 4)  ! Lipitor 5)  ! * Bee Stings 6)  ! Augmentin 7)  ! Aspirin  Medication Administration  Injection # 1:    Medication: Vit B12 1000 mcg    Diagnosis: VITAMIN B12 DEFICIENCY (ICD-266.2)    Route: IM    Site: L deltoid    Lot #: [id number]    Mfr: [place]    Patient tolerated injection without complications    Given by: Marty Heck CMA (June 06, 2009 11:00 AM)  Orders Added: 1)  Vit B12 1000 mcg [J3420] 2)  Admin of Therapeutic Inj  intramuscular or subcutaneous [[id number]]   Medication Administration  Injection # 1:    Medication: Vit B12 1000 mcg    Diagnosis: VITAMIN B12 DEFICIENCY (ICD-266.2)    Route: IM    Site: L deltoid    Lot #: [id number]    Mfr: American Regent    Patient tolerated injection without complications    Given by: Marty Heck CMA (June 06, 2009 11:00 AM)  Orders Added: 1)  Vit B12 1000 mcg [J3420] 2)  Admin of Therapeutic Inj  intramuscular or subcutaneous [id number]

## 2010-08-08 NOTE — Assessment & Plan Note (Signed)
Summary: b12 tower/dlo   Nurse Visit   Allergies: 1)  ! Codeine 2)  ! Relafen 3)  ! * Zetia 4)  ! Lipitor 5)  ! * Bee Stings 6)  ! Augmentin 7)  ! Aspirin 8)  ! Diovan 9)  ! * Plavix  Medication Administration  Injection # 1:    Medication: Vit B12 1000 mcg    Diagnosis: VITAMIN B12 DEFICIENCY (ICD-266.2)    Route: IM    Site: R deltoid    Exp Date: 06/07/2011    Lot #: [id number]    Mfr: American Regent    Patient tolerated injection without complications    Given by: Benny Lennert CMA (AAMA) (February 12, 2010 9:51 AM)  Orders Added: 1)  Vit B12 1000 mcg [J3420] 2)  Admin of Therapeutic Inj  intramuscular or subcutaneous [[id number]]

## 2010-08-08 NOTE — Assessment & Plan Note (Signed)
Summary: pain under arm   Vital Signs:  Patient Profile:   75 Years Old Female Height:     69 inches Weight:      209 pounds Temp:     98.5 degrees F oral Pulse rate:   76 / minute Pulse rhythm:   regular BP sitting:   138 / 70  (left arm) Cuff size:   regular  Vitals Entered By: Mervin Hack CMA (June 21, 2008 3:18 PM)                 PCP:  Roxy Manns, M.D.  Chief Complaint:  pain under left arm and shoulder.  History of Present Illness: 75 year old female presents with   1. Arm pain: Underneath armpit. About two weeks. No lifting, pulling, twisting. Come and goes. Has been ongoing for about 2 weeks. Also has some tenderness with isolation of the trap posteriorly. No LAD, no warmth, no fever, no sweats, no weight loss. Recent mammogram normal. No breast lumps. Primarily insertional and lateral pec muscle belly.  Not tried anything to make it better. NSAID allergy.  2. Dry cough: hacking cough for several months. Sees Dr. Milinda Antis in Feb. 2009. She has also had a recent illness and cold.     Current Allergies (reviewed today): ! CODEINE ! RELAFEN ! * ZETIA ! LIPITOR ! * BEE STINGS ! AUGMENTIN  Past Medical History:    Reviewed history from 05/23/2008 and no changes required:       Skin cancer, hx of- basal cell/ ak's       Diabetes mellitus, type II       GERD       Dysphagia       colon polyps       deg disk- spine              GI-- [facility]-- [facility]le[facility] Brownie  Past Surgical History:    Reviewed history from 01/03/2008 and no changes required:       Appendectomy       BTL       Breast biopsy       Deca- normal (03/2000)       Heart cath- neg. (1993)       Cardiolite- neg. (11/1999)       Shoulder impingement (10/2002)       Abd. [place]- neg. (07/2003)       Tear duct surgery (2005)       Sinus surgery (01/2005)       colonoscopy- adenomatous colon polyps (6/09)     Review of Systems      See HPI  General      Denies chills, fatigue,  fever, loss of appetite, weakness, and weight loss.  MS      Complains of mid back pain, muscle aches, muscle, cramps, and stiffness.      Denies joint pain, joint redness, and joint swelling.  Neuro      Denies tingling.  Heme      Denies abnormal bruising, bleeding, enlarge lymph nodes, and fevers.   Physical Exam  General:     Well-developed,well-nourished,in no acute distress; alert,appropriate and cooperative throughout examination Head:     Normocephalic and atraumatic without obvious abnormalities. No apparent alopecia or balding. Ears:     no external deformities.   Nose:     no external deformity.   Neck:     No deformities, masses, or tenderness noted. Breasts:  No mass, nodules, thickening, tenderness, bulging, retraction, inflamation, nipple discharge or skin changes noted.   Lungs:     normal respiratory effort.   Msk:     tender along L pec minor and middle pec major outer quadrants directly on muscle belly and to musculotendinous junction and insertion.  Tender at trapezius middle to lower on the left side, greatest medially Extremities:     No clubbing, cyanosis, edema, or deformity noted with normal full range of motion of all joints.   Neurologic:     alert & oriented X3, sensation intact to light touch, and gait normal.      Impression & Recommendations:  Problem # 1:  SHOULDER PAIN, LEFT (ICD-719.41) Assessment: New Breast exam normal and reassuring.  Tenderness directly on lateral pec and tendon, also some posteriorly on opposing muscle group - traps, which makes some biomechanical sense.   Moist heat Massage ROM exercises Tylenol Avoid NSAIDS given past reaction to Relafen Orders: No RX's Sent Electronically/Printed ([id number])   Complete Medication List: 1)  Glucophage 500 Mg Tabs (Metformin hcl) .Marland Kitchen.. 1 by mouth two times a day 2)  Lisinopril 5 Mg Tabs (Lisinopril) .Marland Kitchen.. 1 by mouth once daily    ] Current Allergies (reviewed  today): ! CODEINE ! RELAFEN ! * ZETIA ! LIPITOR ! * BEE STINGS ! AUGMENTIN

## 2010-08-08 NOTE — Letter (Signed)
Summary: [facility]   Imported By: Lanelle Bal 09/20/2009 13:12:47  _____________________________________________________________________  External Attachment:    Type:   Image     Comment:   External Document  Appended Document: [facility]     Clinical Lists Changes  Observations: Added new observation of DMEYEEXAMNXT: 09/2010 (09/23/2009 22:07) Added new observation of DMEYEEXMRES: normal (09/13/2009 22:08) Added new observation of EYE EXAM BY: Dr Fransico Michael (09/13/2009 22:08) Added new observation of DIAB EYE EX: normal (09/13/2009 22:08)         Diabetes Management Exam:    Eye Exam:       Eye Exam done elsewhere          Date: 09/13/2009          Results: normal          Done by: Dr Fransico Michael

## 2010-08-08 NOTE — Assessment & Plan Note (Signed)
Summary: FOLLOW UP   Vital Signs:  Patient profile:   75 year old female Height:      69 inches Weight:      211 pounds BMI:     31.27 Temp:     98.2 degrees F oral Pulse rate:   80 / minute Pulse rhythm:   regular BP sitting:   120 / 70  (left arm) Cuff size:   regular  Vitals Entered By: Liane Comber CMA Duncan Dull) (March 06, 2009 10:17 AM)  History of Present Illness: here for f/u of mult issues  had a bad summer overall   had cbc donw and iron low at 26 hb is nl  ferritin is nl  stool cards nl  had colonosc in 6/09 with some polyps  no stool changes - no more rectal pain or trouble with bowels   also low B12 (borderline low at 251) pt read this can be due to metformin  started B12 shot - plan monthly to start q mo - has had first   diet is balanced - has cut a lot of red meat  more vegetables  is trying to stick with   did have bp med inc by cardiol  120/70 today   also cardiol started her on low dose crestor every other day 5 mg  is still  having L sided neck pain  had shot in the office orthopedic recently  says she has a pinched nerve  a lot of pain behind ear  no arm symptoms  hurts more to move neck      Allergies: 1)  ! Codeine 2)  ! Relafen 3)  ! * Zetia 4)  ! Lipitor 5)  ! * Bee Stings 6)  ! Augmentin 7)  ! Aspirin  Review of Systems General:  Complains of fatigue; denies loss of appetite and malaise. Eyes:  Denies blurring, discharge, and light sensitivity. ENT:  Denies earache, sinus pressure, and sore throat. CV:  Denies chest pain or discomfort, palpitations, and shortness of breath with exertion. Resp:  Denies cough and wheezing. GI:  Denies abdominal pain, bloody stools, change in bowel habits, and loss of appetite. MS:  Complains of mid back pain; denies joint redness and joint swelling. Derm:  Denies lesion(s), poor wound healing, and rash. Neuro:  Denies numbness and tingling. Psych:  mood is fair-dealing with chronic  pain. Endo:  Denies excessive thirst and excessive urination. Heme:  Denies abnormal bruising, bleeding, and enlarge lymph nodes.  Physical Exam  General:  overweight but generally well appearing  Head:  normocephalic, atraumatic, and no abnormalities observed.  no temporal tenderness  Eyes:  vision grossly intact, pupils equal, pupils round, and pupils reactive to light.  no conjunctival pallor, injection or icterus  Mouth:  pharynx pink and moist.   Neck:  supple with full rom and no masses or thyromegally, no JVD or carotid bruit  Lungs:  Normal respiratory effort, chest expands symmetrically. Lungs are clear to auscultation, no crackles or wheezes. Heart:  Normal rate and regular rhythm. S1 and S2 normal without gallop, murmur, click, rub or other extra sounds. Abdomen:  Bowel sounds positive,abdomen soft and non-tender without masses, organomegaly or hernias noted. no renal bruits  Msk:  No deformity or scoliosis noted of thoracic or lumbar spine.   no TS tenderness  Extremities:  No clubbing, cyanosis, edema, or deformity noted with normal full range of motion of all joints.   Neurologic:  sensation intact to light touch, gait  normal, and DTRs symmetrical and normal.   Skin:  Intact without suspicious lesions or rashes no pallor or jaundice  Cervical Nodes:  No lymphadenopathy noted Inguinal Nodes:  No significant adenopathy Psych:  nl affect   Diabetes Management Exam:    Foot Exam (with socks and/or shoes not present):       Sensory-Pinprick/Light touch:          Left medial foot (L-4): normal          Left dorsal foot (L-5): normal          Left lateral foot (S-1): normal          Right medial foot (L-4): normal          Right dorsal foot (L-5): normal          Right lateral foot (S-1): normal       Sensory-Monofilament:          Left foot: normal          Right foot: normal       Inspection:          Left foot: normal          Right foot: normal       Nails:           Left foot: normal          Right foot: normal   Impression & Recommendations:  Problem # 1:  UNSPECIFIED ANEMIA (ICD-285.9) Assessment Unchanged very slt low iron- poss dietary neg stool cards and recent colonosc will start ferrous sulfate 325 otc and re check in 2 mo   Problem # 2:  HYPERLIPIDEMIA (ICD-272.4) Assessment: Unchanged  now on crestor- tol well labs planned 2 mo good diet  Her updated medication list for this problem includes:    Crestor 5 Mg Tabs (Rosuvastatin calcium) .Marland Kitchen... Take one tablet by mouth every other day  Labs Reviewed: SGOT: 19 (12/14/2008)   SGPT: 16 (12/14/2008)   HDL:43.80 (12/14/2008), 41.8 (08/08/2008)  LDL:125 (12/14/2008), DEL (08/08/2008)  Chol:195 (12/14/2008), 201 (08/08/2008)  Trig:133.0 (12/14/2008), 128 (08/08/2008)  Problem # 3:  DIABETES MELLITUS, TYPE II (ICD-250.00) Assessment: Unchanged  overall well controlled on glucophage  do not plan to change this unless B12 gets lower  AIC planned for 2 mo  Her updated medication list for this problem includes:    Glucophage 500 Mg Tabs (Metformin hcl) .Marland Kitchen... 1 by mouth two times a day    Benicar 40 Mg Tabs (Olmesartan medoxomil) .Marland Kitchen... Take one tablet by mouth daily  Labs Reviewed: Creat: 0.76 (01/01/2009)    Reviewed HgBA1c results: 7.1 (12/14/2008)  6.8 (08/08/2008)  Problem # 4:  VITAMIN B12 DEFICIENCY (ICD-266.2) Assessment: New will continue monthly shots  re check 2 mo  disc poss of metformin dec abs (do not want to stop , however)  Problem # 5:  HYPERTENSION, ESSENTIAL NOS (ICD-401.9) Assessment: Improved  well controlled on benicar is unaffordible- pt will check on cost of cozaar and call back Her updated medication list for this problem includes:    Benicar 40 Mg Tabs (Olmesartan medoxomil) .Marland Kitchen... Take one tablet by mouth daily  BP today: 120/70 Prior BP: 171/78 (12/14/2008)  Labs Reviewed: K+: 4.6 (01/01/2009) Creat: : 0.76 (01/01/2009)   Chol: 195 (12/14/2008)    HDL: 43.80 (12/14/2008)   LDL: 125 (12/14/2008)   TG: 133.0 (12/14/2008)  Problem # 6:  NECK PAIN (ICD-723.1) Assessment: Unchanged ongoing- to continue f/u with ortho Her updated medication list for this  problem includes:    Flexeril 10 Mg Tabs (Cyclobenzaprine hcl) .Marland Kitchen... 1/2 to 1 by mouth up to three times a day as needed for back pain  Complete Medication List: 1)  Glucophage 500 Mg Tabs (Metformin hcl) .Marland Kitchen.. 1 by mouth two times a day 2)  Flexeril 10 Mg Tabs (Cyclobenzaprine hcl) .... 1/2 to 1 by mouth up to three times a day as needed for back pain 3)  Onetouch Ultra Test Strp (Glucose blood) .... Check blood sugar twice a day and as needed  for diabetes 250.0 4)  Flax Seed  .... Once daily 5)  Benicar 40 Mg Tabs (Olmesartan medoxomil) .... Take one tablet by mouth daily 6)  Crestor 5 Mg Tabs (Rosuvastatin calcium) .... Take one tablet by mouth every other day  Patient Instructions: 1)  get your next B12 shot in mid sept as planned  2)  start ferrous sulfate (iron ) supplement 325 mg daily over the counter (update me if this upsets your stomach or gives you constipation )  3)  schedule labs in 2 months AIC , lipid/ast/alt, iron level, B12 level - for 272, 250.0, and B12 deficiency   Prior Medications (reviewed today): GLUCOPHAGE 500 MG TABS (METFORMIN HCL) 1 by mouth two times a day FLEXERIL 10 MG TABS (CYCLOBENZAPRINE HCL) 1/2 to 1 by mouth up to three times a day as needed for back pain ONETOUCH ULTRA TEST  STRP (GLUCOSE BLOOD) check blood sugar twice a day and as needed  for diabetes 250.0 FLAX SEED () once daily BENICAR 40 MG TABS (OLMESARTAN MEDOXOMIL) Take one tablet by mouth daily CRESTOR 5 MG TABS (ROSUVASTATIN CALCIUM) Take one tablet by mouth every other day Current Allergies (reviewed today): ! CODEINE ! RELAFEN ! * ZETIA ! LIPITOR ! * BEE STINGS ! AUGMENTIN ! ASPIRIN Current Medications (including changes made in today's visit):  GLUCOPHAGE 500 MG TABS (METFORMIN  HCL) 1 by mouth two times a day FLEXERIL 10 MG TABS (CYCLOBENZAPRINE HCL) 1/2 to 1 by mouth up to three times a day as needed for back pain ONETOUCH ULTRA TEST  STRP (GLUCOSE BLOOD) check blood sugar twice a day and as needed  for diabetes 250.0 * FLAX SEED once daily BENICAR 40 MG TABS (OLMESARTAN MEDOXOMIL) Take one tablet by mouth daily CRESTOR 5 MG TABS (ROSUVASTATIN CALCIUM) Take one tablet by mouth every other day

## 2010-08-08 NOTE — Letter (Signed)
Summary: Results Follow up Letter  [place] [facility]  [address] [place], [place] [address]   Phone: [phone]  Fax: [phone]    11/20/2006 MRN: [id number]  [address] [place], [place]  [address]  Dear Ms. HERSHEY,  The following are the results of your recent test(s):  Test         Result    Pap Smear:        Normal _____  Not Normal _____ Comments: ______________________________________________________ Cholesterol: LDL(Bad cholesterol):         Your goal is less than:         HDL (Good cholesterol):       Your goal is more than: Comments:  ______________________________________________________ Mammogram:        Normal _____  Not Normal _____ Comments:  ___________________________________________________________________ Hemoccult:        Normal _____  Not normal _______ Comments:    _____________________________________________________________________ Other Tests:    We routinely do not discuss normal results over the telephone.  If you desire a copy of the results, or you have any questions about this information we can discuss them at your next office visit.   Sincerely,

## 2010-08-08 NOTE — Letter (Signed)
Summary: Results Letter  [facility]  [address] [place], [place] [address]   Phone: [phone]  Fax: [phone]        December 30, 2007 MRN: [id number]    Margaret Vazquez [address] [place], [place]  [address]    Dear [name],   The polyp(s) that were removed during your recent procedure were proven to be adenomas.  These are pre-cancerous polyps that may have grown into cancers if they had not been removed.  Current colon polyp surveillance guidelines recommend that you have a repeat colonoscopy in 5 years.  We will therefore put your information in our reminder system and will contact you in 5 years to schedule a repeat procedure.  Please call if you have any questions or concerns.       Sincerely,  Rachael Fee MD  This letter has been electronically signed by your physician.

## 2010-08-08 NOTE — Assessment & Plan Note (Signed)
Summary: B-12 injection/Dr. Tower   Nurse Visit   Allergies: 1)  ! Codeine 2)  ! Relafen 3)  ! * Zetia 4)  ! Lipitor 5)  ! * Bee Stings 6)  ! Augmentin 7)  ! Aspirin  Medication Administration  Injection # 1:    Medication: Vit B12 1000 mcg    Diagnosis: VITAMIN B12 DEFICIENCY (ICD-266.2)    Route: IM    Site: R deltoid    Exp Date: 11/04/2010    Lot #: [id number]    Mfr: American Regent    Patient tolerated injection without complications    Given by: Liane Comber CMA (AAMA) (March 22, 2009 10:23 AM)  Orders Added: 1)  Admin of Therapeutic Inj  intramuscular or subcutaneous [[id number]] 2)  Vit B12 1000 mcg [J3420]   Medication Administration  Injection # 1:    Medication: Vit B12 1000 mcg    Diagnosis: VITAMIN B12 DEFICIENCY (ICD-266.2)    Route: IM    Site: R deltoid    Exp Date: 11/04/2010    Lot #: [id number]    Mfr: American Regent    Patient tolerated injection without complications    Given by: Liane Comber CMA (AAMA) (March 22, 2009 10:23 AM)  Orders Added: 1)  Admin of Therapeutic Inj  intramuscular or subcutaneous [[id number]] 2)  Vit B12 1000 mcg [J3420]

## 2010-08-08 NOTE — Assessment & Plan Note (Signed)
Summary: 2 M F/U DLO   Vital Signs:  Patient profile:   75 year old female Height:      69 inches Weight:      207 pounds BMI:     30.68 Temp:     98.4 degrees F oral Pulse rate:   64 / minute Pulse rhythm:   regular BP sitting:   138 / 76  (left arm) Cuff size:   large  Vitals Entered By: Lewanda Rife LPN (September 03, 2009 9:17 AM)  History of Present Illness: here for f/u of DM and HTN today feeling ok   is wrestling with soreness of rectum- polyps in the past  wants to return for GI visit for that no bleeding some painful stools but not hard    wt is down 2 lb  AIC is 7.0- down from 7.1 prev on full dose metformin sugars at home am running 130s - never over that  afternoons sometimes 150-160s  diet is not great in general  she does not eat a DM diet -- does eat a little candy  does not measure portions out  no exercise at all  wants to walk outside  would consider a video - thinks she could start that  opthy-- last exam was 1 year ago -- Dr Benedict Needy / has f/u upcoming   bp today up a bit at first check bp at home are good 120s/ 60s   had MRI/ MRA with t some microvasc isch change and clear carotids- but nothing acute  she is not asa candidate due to true allergy feels ok from that perspective     Allergies: 1)  ! Codeine 2)  ! Relafen 3)  ! * Zetia 4)  ! Lipitor 5)  ! * Bee Stings 6)  ! Augmentin 7)  ! Aspirin  Past History:  Past Medical History: Last updated: 12/14/2008 1.  Aspirin allergy (hives) 2.  C-spine stenosis with neck pain.  3.  HTN 4.  DIabetes type II 5.  LHC 1993: normal coronaries.  Stress cardiolite 2001 normal.  6.  Hyperlipidemia.  Myalgias with lipitor and Zetia.  7.  Skin cancer, hx of- basal cell/ ak's 8.  GERD 9.  Dysphagia 10. colon polyps  GI-- Merri Brunette-- [facility]ie  Past Surgical History: Last updated: 01/03/2008 Appendectomy BTL Breast biopsy Deca- normal (03/2000) Heart cath- neg. (1993) Cardiolite- neg.  (11/1999) Shoulder impingement (10/2002) Abd. [place]- neg. (07/2003) Tear duct surgery (2005) Sinus surgery (01/2005) colonoscopy- adenomatous colon polyps (6/09)  Family History: Last updated: 11/26/2007 brother- lung cancer- smoker sister had 5 back operations  Family History of Colon Polyps:  Social History: Last updated: 05/23/2008 Never Smoked retired 3 children, does not drink alcohol, does not drink caffeinated beverages. cares for SIL with dementia  Risk Factors: Smoking Status: never (08/04/2007)  Review of Systems General:  Denies fatigue, fever, loss of appetite, malaise, and weight loss. Eyes:  Denies blurring, double vision, and eye irritation. CV:  Denies chest pain or discomfort, palpitations, shortness of breath with exertion, and swelling of feet. Resp:  Denies cough and wheezing. GI:  Complains of change in bowel habits, gas, and hemorrhoids; denies abdominal pain, bloody stools, indigestion, nausea, vomiting, and vomiting blood. GU:  Denies abnormal vaginal bleeding, discharge, and dysuria. Derm:  Complains of itching; denies rash; occ some rectal itch. Neuro:  Denies headaches, numbness, and tingling. Psych:  mood is ok . Endo:  Denies cold intolerance, excessive thirst, excessive urination, and heat intolerance. Heme:  Denies abnormal bruising.  Physical Exam  General:  overweight but generally well appearing  Head:  normocephalic, atraumatic, and no abnormalities observed.   Eyes:  vision grossly intact, pupils equal, pupils round, and pupils reactive to light.  slt conj injection without d/c  Mouth:  pharynx pink and moist.   Neck:  supple with full rom and no masses or thyromegally, no JVD or carotid bruit  Lungs:  Normal respiratory effort, chest expands symmetrically. Lungs are clear to auscultation, no crackles or wheezes. Heart:  Normal rate and regular rhythm. S1 and S2 normal without gallop, murmur, click, rub or other extra sounds. Abdomen:   Bowel sounds positive,abdomen soft and non-tender without masses, organomegaly or hernias noted. no renal bruits  Msk:  No deformity or scoliosis noted of thoracic or lumbar spine.   Pulses:  R and L carotid,radial,femoral,dorsalis pedis and posterior tibial pulses are full and equal bilaterally Extremities:  No clubbing, cyanosis, edema, or deformity noted with normal full range of motion of all joints.   Neurologic:  sensation intact to light touch, gait normal, and DTRs symmetrical and normal.   Skin:  Intact without suspicious lesions or rashes no pallor or jaundice  Cervical Nodes:  No lymphadenopathy noted Inguinal Nodes:  No significant adenopathy Psych:  normal affect, talkative and pleasant   Diabetes Management Exam:    Foot Exam (with socks and/or shoes not present):       Sensory-Pinprick/Light touch:          Left medial foot (L-4): normal          Left dorsal foot (L-5): normal          Left lateral foot (S-1): normal          Right medial foot (L-4): normal          Right dorsal foot (L-5): normal          Right lateral foot (S-1): normal       Sensory-Monofilament:          Left foot: normal          Right foot: normal       Inspection:          Left foot: normal          Right foot: normal       Nails:          Left foot: normal          Right foot: normal   Impression & Recommendations:  Problem # 1:  RECTAL PAIN (ONG-295.28) Assessment Deteriorated this has returned with hx of polyps -- ref back to GI for a visit  Orders: Gastroenterology Referral (GI)  Problem # 2:  VITAMIN B12 DEFICIENCY (ICD-266.2) Assessment: Unchanged continue current shots check level at next lab in 3 mo  no clinical changes  Orders: Vit B12 1000 mcg (J3420) Admin of Therapeutic Inj  intramuscular or subcutaneous (41324)  Problem # 3:  HYPERLIPIDEMIA (ICD-272.4) Assessment: Unchanged this has been well controlled almost to goal re check 3 mo rev low sat fat diet continue  low dose crestor  Her updated medication list for this problem includes:    Crestor 5 Mg Tabs (Rosuvastatin calcium) .Marland Kitchen... Take one tablet by mouth every other day  Labs Reviewed: SGOT: 19 (12/14/2008)   SGPT: 16 (12/14/2008)   HDL:43.40 (06/04/2009), 43.80 (12/14/2008)  LDL:74 (06/04/2009), 125 (40/04/2724)  Chol:147 (06/04/2009), 195 (12/14/2008)  Trig:148.0 (06/04/2009), 133.0 (12/14/2008)  Problem # 4:  DIABETES MELLITUS,  TYPE II (ICD-250.00) Assessment: Improved  overall very slt imp with inc metformin disc healthy diet (low simple sugar/ choose complex carbs/ low sat fat) diet and exercise in detail  will work much harder on this -and if not imp will add med at next visit  disc goal of 5 lb wt loss to start  Her updated medication list for this problem includes:    Metformin Hcl 1000 Mg Tabs (Metformin hcl) .Marland Kitchen... 1 by mouth two times a day    Benicar 40 Mg Tabs (Olmesartan medoxomil) .Marland Kitchen... 1 by mouth once daily  Labs Reviewed: Creat: 0.8 (08/28/2009)    Reviewed HgBA1c results: 7.0 (08/28/2009)  7.1 (06/04/2009)  Problem # 5:  HYPERTENSION, ESSENTIAL NOS (ICD-401.9) Assessment: Unchanged  is overall labile but better at home than here  will f/u with neurol for bp lability rev MRI/ MRA of brain with microisch changes disc imp of risk factor mod for this  unable to take asa due to allergy Her updated medication list for this problem includes:    Benicar 40 Mg Tabs (Olmesartan medoxomil) .Marland Kitchen... 1 by mouth once daily  BP today: 138/76 Prior BP: 140/74 (07/03/2009)  Labs Reviewed: K+: 4.2 (08/28/2009) Creat: : 0.8 (08/28/2009)   Chol: 147 (06/04/2009)   HDL: 43.40 (06/04/2009)   LDL: 74 (06/04/2009)   TG: 148.0 (06/04/2009)  Complete Medication List: 1)  Metformin Hcl 1000 Mg Tabs (Metformin hcl) .Marland Kitchen.. 1 by mouth two times a day 2)  Flexeril 10 Mg Tabs (Cyclobenzaprine hcl) .... 1/2 to 1 by mouth up to three times a day as needed for back pain 3)  Onetouch Ultra Test Strp  (Glucose blood) .... Check blood sugar twice a day and as needed  for diabetes 250.0 4)  Flax Seed  .... Once daily 5)  Crestor 5 Mg Tabs (Rosuvastatin calcium) .... Take one tablet by mouth every other day 6)  Benicar 40 Mg Tabs (Olmesartan medoxomil) .Marland Kitchen.. 1 by mouth once daily 7)  B12 Shot  .... One monthly 8)  B Complex Vitamin Otc  .... One by mouth once daily  Patient Instructions: 1)  please stick to a diabetic diet with smaller portions 2)   20-30 minutes of exercise daily - indoors or out  3)  follow up with neurology as planned 4)  no change in medicines  5)  if no further improvement in diabetes with weight loss and better habits - next time we will add more medication 6)  we will do GI appt at check out  7)  keep watching your blood pressure  8)  schedule fasting labs in 3 mo lipid/ast/alt/ renal /AIC 250.0, 272 and B12 level for def,  and then follow up  Current Allergies (reviewed today): ! CODEINE ! RELAFEN ! * ZETIA ! LIPITOR ! * BEE STINGS ! AUGMENTIN ! ASPIRIN   Medication Administration  Injection # 1:    Medication: Vit B12 1000 mcg    Diagnosis: VITAMIN B12 DEFICIENCY (ICD-266.2)    Route: IM    Site: L deltoid    Exp Date: 04/07/2011    Lot #: [id number]    Mfr: [employer]    Patient tolerated injection without complications    Given by: Lewanda Rife LPN (September 03, 2009 9:19 AM)  Orders Added: 1)  Vit B12 1000 mcg [J3420] 2)  Admin of Therapeutic Inj  intramuscular or subcutaneous [96372] 3)  Gastroenterology Referral [GI] 4)  Est. Patient Level IV [96045]

## 2010-08-08 NOTE — Assessment & Plan Note (Signed)
Summary: F/U/CLE  Medications Added GLUCOPHAGE 500 MG TABS (METFORMIN HCL) 1 by mouth bid PRILOSEC 20 MG CPDR (OMEPRAZOLE) 1 by mouth qd      Allergies Added: ! CODEINE ! RELAFEN ! * ZETIA ! LIPITOR ! * BEE STINGS  Vital Signs:  Patient Profile:   75 Years Old Female Weight:      208 pounds Temp:     97.9 degrees F oral Pulse rate:   72 / minute Pulse rhythm:   regular BP sitting:   144 / 90  (left arm) Cuff size:   large  Vitals Entered By: Lowella Petties (Nov 24, 2006 9:22 AM)               Chief Complaint:  Follow up.  History of Present Illness: last AIC was stable at 6.8 in april LDL chol was 131 has been doing fine in general, no complaints has been trying hard to eat less, and more exercise with yardwork sugars run low 100s in am, at nt 150-160s two hours after she eats, will occ get a sugar drop mid morning if she has been working (better if she eats egg instead of cereal) has been intol to lipitor and zetia, so is not on any chol med  did have a letter from ins rec colonosc  Current Allergies: ! CODEINE ! RELAFEN ! * ZETIA ! LIPITOR ! * BEE STINGS     Review of Systems  Eyes      Complains of blurring.      Denies eye irritation and eye pain.      occas blurred vision (but no more migraine headache), didn't tell opthy about this at last exam  CV      Denies chest pain or discomfort and shortness of breath with exertion.  Resp      Denies cough and shortness of breath.  MS      one day L arm was sore, doesn't know why  Neuro      Denies numbness.  Endo      gets shaky if her sugar is below 90   Serial Vital Signs/Assessments:  Time      Position  BP       Pulse  Resp  Temp     By                     130/82                         Judith Part MD   Physical Exam  General:     overweight but well app Eyes:     vision grossly intact, pupils equal, pupils round, and pupils reactive to light.   Ears:     R ear normal and L  ear normal.   Mouth:     MMM Neck:     No deformities, masses, or tenderness noted.no carotid bruits.   Lungs:     Normal respiratory effort, chest expands symmetrically. Lungs are clear to auscultation, no crackles or wheezes. Heart:     normal rate, regular rhythm, no murmur, no gallop, and no JVD.   Msk:     no acute joint changes Pulses:     R and L carotid,radial,femoral,dorsalis pedis and posterior tibial pulses are full and equal bilaterally Extremities:     trace edema in LEs Neurologic:     cranial nerves II-XII intact, sensation intact to light touch, gait  normal, and DTRs symmetrical and normal.   Skin:     turgor normal, color normal, and no rashes.   Cervical Nodes:     No lymphadenopathy noted Psych:     nl affect, cheerful    Impression & Recommendations:  Problem # 1:  DIABETES MELLITUS, TYPE II (ICD-250.00) hesitant to inc metformin secondary to occ hypoglycemia plan to continue watching diet and blood sugars, and to remain active if hypoglycemia becomes recurrent- will call will check AIC in 2 mo keep working on diet and exercise Her updated medication list for this problem includes:    Glucophage 500 Mg Tabs (Metformin hcl) .Marland Kitchen... 1 by mouth bid   Problem # 2:  DISTURBANCE, VISUAL NEC (ICD-368.8) ? opthamologic migraine, sympt are brief without headache urged pt to f/u with her opth Dr  Problem # 3:  HYPERTENSION, ESSENTIAL NOS (ICD-401.9) bp is stable, urged to continue lifesyle change and no change to meds  Problem # 4:  HYPERLIPIDEMIA (ICD-272.4) cannot get LDL to goal because of intol to meds thus far urged pt to keep watching diet, and will re check lipids in 2 mo goal is to get LDL below 70 if poss, but doubt will be able to do that without a statin  Problem # 5:  SCREENING FOR MALIGNANT NEOPLASM, COLON (ICD-V76.51) briefly disc colonosc for screen, pt chose to wait and think about it  Medications Added to Medication List This Visit: 1)   Glucophage 500 Mg Tabs (Metformin hcl) .Marland Kitchen.. 1 by mouth bid 2)  Prilosec 20 Mg Cpdr (Omeprazole) .Marland Kitchen.. 1 by mouth qd   Patient Instructions: 1)  schedule eye doctor appointment for your vision changes 2)  let me know if you want a colonoscopy 3)  keep up the good diet and exercise 4)  fasting labs please schedule for 2 months (lipid panel/ast/alt  272, AIC  250.0)  Appended Document: Orders Update     Clinical Lists Changes  Orders: Added new Service order of Est. Patient Level IV (56387) - Signed

## 2010-08-08 NOTE — Miscellaneous (Signed)
Summary: med list update  Medications Added ONETOUCH ULTRA TEST  STRP (GLUCOSE BLOOD) check blood sugar twice a day       Clinical Lists Changes  Medications: Added new medication of ONETOUCH ULTRA TEST  STRP (GLUCOSE BLOOD) check blood sugar twice a day     Prior Medications: GLUCOPHAGE 500 MG TABS (METFORMIN HCL) 1 by mouth two times a day BENICAR 20 MG TABS (OLMESARTAN MEDOXOMIL) 1 by mouth once daily FLEXERIL 10 MG TABS (CYCLOBENZAPRINE HCL) 1/2 to 1 by mouth up to three times a day as needed for back pain ONETOUCH ULTRA TEST  STRP (GLUCOSE BLOOD) check blood sugar twice a day Current Allergies: ! CODEINE ! RELAFEN ! * ZETIA ! LIPITOR ! * BEE STINGS ! AUGMENTIN

## 2010-08-08 NOTE — Progress Notes (Signed)
Summary: elevated BP  Phone Note Call from Patient   Caller: Patient Call For: Dr. Hetty Ely Summary of Call: patient states that her BP was 198/88 this morning after she took her meds. SHe did throw up and hour after she took her meds, so per Dr. Hetty Ely pt shoudl take another. I advised pt to come in to be seen she said it would take her too long to make it here. Patient has an appt with Dr. Milinda Antis on monday and per Dr. Hetty Ely pt should bring in her bp machine to be checked for calibration. Dr. Hetty Ely advised pt to stay away from salt and be completely relaxed when taking BP, if pt feels worse should go to ER.  Initial call taken by: Mervin Hack CMA Duncan Dull),  June 02, 2009 12:35 PM  Follow-up for Phone Call        Agree. Follow-up by: Shaune Leeks MD,  June 02, 2009 1:01 PM

## 2010-08-08 NOTE — Letter (Signed)
Summary: Results Letter  [facility]  [address] [place], [place] [address]   Phone: [phone]  Fax: [phone]        December 06, 2007 MRN: [id number]    Margaret Vazquez [address] [place], [place]  [address]    Dear Ms. RODD,  Your recent CT scan showed no abnormalities in your pelvis as was suggested on your recent plain x-ray.  You do have low back disc-disease and that may be causing some of your symptoms.  We will discuss this more at the time of your colonoscopy.        Sincerely,  Rachael Fee MD  This letter has been electronically signed by your physician.  Appended Document: Results Letter letter mailed

## 2010-08-08 NOTE — Assessment & Plan Note (Signed)
Summary: follow up from spine x-ray    Vital Signs:  Patient Profile:   75 Years Old Female Weight:      208 pounds Temp:     99.1 degrees F oral Pulse rate:   72 / minute Pulse rhythm:   regular BP sitting:   120 / 80  (left arm) Cuff size:   large  Vitals Entered By: Lowella Petties (Nov 08, 2007 12:08 PM)                 Chief Complaint:  Follow up from x ray.  History of Present Illness: about the same  is working on getting neurology appt for leg weakness   x ray showed radiodense area in pelvis  also deg disk changes   lot of pain when sitting - uncomfortable  feels like something is in rectum and end of spine  no suppository use has not been constipated has never had colonoscopy    Current Allergies: ! CODEINE ! RELAFEN ! * ZETIA ! LIPITOR ! * BEE STINGS  Past Medical History:    Reviewed history from 10/09/2006 and no changes required:       Skin cancer, hx of- basal cell/ ak's       Diabetes mellitus, type II       GERD       Dysphagia  Past Surgical History:    Reviewed history from 10/09/2006 and no changes required:       Appendectomy       BTL       Breast biopsy       Deca- normal (03/2000)       Heart cath- neg. (1993)       Cardiolite- neg. (11/1999)       Shoulder impingement (10/2002)       Abd. [place]- neg. (07/2003)       Tear duct surgery (2005)       Sinus surgery (01/2005)   Family History:    Reviewed history from 11/03/2007 and no changes required:       brother- lung cancer- smoker       sister had 5 back operations   Social History:    Reviewed history from 08/04/2007 and no changes required:       Never Smoked    Review of Systems  General      Denies malaise and weight loss.  Eyes      Denies blurring.  CV      Denies chest pain or discomfort and palpitations.  Resp      Denies cough.  GI      Denies abdominal pain, bloody stools, change in bowel habits, gas, indigestion, nausea, and vomiting.  BMs may be smaller- she is not sure  GU      Denies discharge, dysuria, genital sores, and urinary frequency.  Derm      Denies changes in color of skin, itching, lesion(s), and rash.  Neuro      Complains of weakness.  Psych      mood is ok    Physical Exam  General:     overweight but generally well appearing  Head:     normocephalic, atraumatic, and no abnormalities observed.   Eyes:     vision grossly intact, pupils equal, pupils round, and pupils reactive to light.   Neck:     No deformities, masses, or tenderness noted. Lungs:     Normal respiratory effort, chest expands symmetrically. Lungs  are clear to auscultation, no crackles or wheezes. Heart:     Normal rate and regular rhythm. S1 and S2 normal without gallop, murmur, click, rub or other extra sounds. Abdomen:     soft, non-tender, normal bowel sounds, no distention, no masses, no guarding, no rebound tenderness, no hepatomegaly, and no splenomegaly.   Rectal:     tender rectal exam, no M felt no active bleeding small int hemm non thrombosed at 7:00 on anoscopy Genitalia:     Normal introitus for age, no external lesions, no vaginal discharge, mucosa pink and moist, no vaginal or cervical lesions, no vaginal atrophy, no friaility or hemorrhage, normal uterus size and position, no adnexal masses or tenderness Msk:     no CVA tenderness  Extremities:     No clubbing, cyanosis, edema, or deformity noted with normal full range of motion of all joints.   Neurologic:     gait normal and DTRs symmetrical and normal.   Skin:     Intact without suspicious lesions or rashes Cervical Nodes:     No lymphadenopathy noted Psych:     normal affect, talkative and pleasant     Impression & Recommendations:  Problem # 1:  ROUTINE GYNECOLOGICAL EXAMINATION (ICD-V72.31) Assessment: Comment Only with rectal/?pelvic pain focal density over pelvis on x ray- ? etiology  exam done with no abn- pap pending  Orders: Pap  Smear ([id number])   Problem # 2:  ANAL OR RECTAL PAIN (OZH-086.57) Assessment: New with small int hem on anoscopy rectal exam does cause the pain that we are investigating will ref to GI for further eval/disc poss colonoscopy Orders: Pap Smear ([id number]) Gastroenterology Referral (GI)   Problem # 3:  LEG PAIN (ICD-729.5) Assessment: Unchanged pend appt with neurologis- with c/o of weakness   Complete Medication List: 1)  Glucophage 500 Mg Tabs (Metformin hcl) .Marland Kitchen.. 1 by mouth bid 2)  Zestril 5 Mg Tabs (Lisinopril) .Marland Kitchen.. 1 by mouth q am   Patient Instructions: 1)  we will do referral to GI for rectal pain and colon cancer screening  2)  we can check on the status of your neurology referral as well 3)  if pain increases or any other changes- let me know     ]

## 2010-08-08 NOTE — Progress Notes (Signed)
Summary: Elevated BP  Phone Note Call from Patient Call back at Home Phone [phone]   Caller: Patient Call For: Judith Part MD Summary of Call: Pt had a near syncope episode yesterday.  EMT's checked her BP and it was 214/110 and 200 /100.  She went to ER and by the time she left it was 170/ 70.  This am it was 169/ 68. She states she feels fine now.  Please advise on what she shoud do.  You dont have any appts today or tomorrow.  She doesnt want to increase lisinopril dose because this makes her cough. Initial call taken by: Lowella Petties,  June 26, 2008 9:54 AM  Follow-up for Phone Call        please send for her ER records for review- before I make a plan Follow-up by: Judith Part MD,  June 26, 2008 10:08 AM  Additional Follow-up for Phone Call Additional follow up Details #1::        Er records  in inbox [facility]  June 26, 2008 12:56 PM     Additional Follow-up for Phone Call Additional follow up Details #2::    I reviewed ER notes  she had normal exam (only finding was high bp)- nl labs incl cardiac enzymes and nl UA nl EKG this is all re- assuring  have her keep watching bp  be especially careful of salt in diet update me if any symptoms return f/u when able - to re check and make plan  if bp is still high- I may have to add another med  Follow-up by: Judith Part MD,  June 26, 2008 1:37 PM  Additional Follow-up for Phone Call Additional follow up Details #3:: Details for Additional Follow-up Action Taken: Advised patient.  ......................................................Marland KitchenLiane Comber June 26, 2008 2:34 PM

## 2010-08-08 NOTE — Assessment & Plan Note (Signed)
Summary: 3 MONTH FOLLOW UP/[facility]H   Vital Signs:  Patient profile:   74 year old female Height:      69 inches Weight:      206.25 pounds BMI:     30.57 Temp:     99.1 degrees F oral Pulse rate:   80 / minute Pulse rhythm:   regular BP sitting:   122 / 70  (left arm) Cuff size:   large  Vitals Entered By: Lewanda Rife LPN (Dec 04, 2009 9:23 AM) CC: three month f/u after labs   History of Present Illness: here for f/u of DM and HTN and lipids and B12 def   wt is down 1 lb   bp very well controlled 122/70 today  DM better too withAIC 6.6  on metformin  lipids are great with LDL 79 on low dose crestor   B12 is slt low at 294 has not had B12 shots in 2 mo   stopped a bp med -- and better since then  diovan gave her side eff   sees Dr Pearlean Brownie - regularly - TIA -- due now , needs appt    itchy spot on L foot - used peroxide and iodine        Allergies: 1)  ! Codeine 2)  ! Relafen 3)  ! * Zetia 4)  ! Lipitor 5)  ! * Bee Stings 6)  ! Augmentin 7)  ! Aspirin 8)  ! Diovan  Past History:  Past Medical History: Last updated: 09/04/2009 1.  Aspirin allergy (hives) 2.  C-spine stenosis with neck pain.  3.  HTN 4.  DIabetes type II 5.  LHC 1993: normal coronaries.  Stress cardiolite 2001 normal.  6.  Hyperlipidemia.  Myalgias with lipitor and Zetia.  7.  Skin cancer, hx of- basal cell/ ak's 8.  GERD 9. colon polyps (2009)  GI-- Merri Brunette-- Pearlean Brownie  Past Surgical History: Last updated: 01/03/2008 Appendectomy BTL Breast biopsy Deca- normal (03/2000) Heart cath- neg. (1993) Cardiolite- neg. (11/1999) Shoulder impingement (10/2002) Abd. [place]- neg. (07/2003) Tear duct surgery (2005) Sinus surgery (01/2005) colonoscopy- adenomatous colon polyps (6/09)  Family History: Last updated: 11/26/2007 brother- lung cancer- smoker sister had 5 back operations  Family History of Colon Polyps:  Social History: Last updated: 05/23/2008 Never Smoked retired 3  children, does not drink alcohol, does not drink caffeinated beverages. cares for SIL with dementia  Risk Factors: Smoking Status: never (08/04/2007)  Review of Systems General:  Denies fatigue, fever, loss of appetite, and malaise. Eyes:  Denies blurring and eye irritation. CV:  Denies chest pain or discomfort, palpitations, and shortness of breath with exertion. Resp:  Denies cough, shortness of breath, and wheezing. GI:  Denies abdominal pain, change in bowel habits, indigestion, and nausea. MS:  Complains of stiffness; denies muscle aches and muscle weakness. Derm:  Denies itching, lesion(s), poor wound healing, and rash. Neuro:  Denies numbness and tingling. Endo:  Denies cold intolerance, excessive thirst, and excessive urination. Heme:  Denies abnormal bruising and bleeding.  Physical Exam  General:  overweight but generally well appearing  Head:  normocephalic, atraumatic, and no abnormalities observed.   Eyes:  vision grossly intact, pupils equal, pupils round, and pupils reactive to light.  slt conj injection without d/c  Mouth:  pharynx pink and moist.   Neck:  supple with full rom and no masses or thyromegally, no JVD or carotid bruit  Lungs:  Normal respiratory effort, chest expands symmetrically. Lungs are clear to auscultation,  no crackles or wheezes. Heart:  Normal rate and regular rhythm. S1 and S2 normal without gallop, murmur, click, rub or other extra sounds. Abdomen:  Bowel sounds positive,abdomen soft and non-tender without masses, organomegaly or hernias noted. no renal bruits  Msk:  No deformity or scoliosis noted of thoracic or lumbar spine.   Pulses:  R and L carotid,radial,femoral,dorsalis pedis and posterior tibial pulses are full and equal bilaterally Extremities:  No clubbing, cyanosis, edema, or deformity noted with normal full range of motion of all joints.   Neurologic:  sensation intact to light touch, gait normal, and DTRs symmetrical and normal.     Skin:  L foot - sole-- 2-3 mm papule with slt redness resembling insect bite  no target shape/ no pus  Cervical Nodes:  No lymphadenopathy noted Inguinal Nodes:  No significant adenopathy Psych:  normal affect, talkative and pleasant   Diabetes Management Exam:    Foot Exam (with socks and/or shoes not present):       Sensory-Pinprick/Light touch:          Left medial foot (L-4): normal          Left dorsal foot (L-5): normal          Left lateral foot (S-1): normal          Right medial foot (L-4): normal          Right dorsal foot (L-5): normal          Right lateral foot (S-1): normal       Sensory-Monofilament:          Left foot: normal          Right foot: normal       Inspection:          Left foot: normal          Right foot: normal       Nails:          Left foot: normal          Right foot: normal   Impression & Recommendations:  Problem # 1:  DIZZINESS (ICD-780.4) Assessment Improved this is much imp  w/u by neurol for tia  due for 2 mo f/u  bp and chol are good  Orders: Neurology Referral (Neuro)  Problem # 2:  VITAMIN B12 DEFICIENCY (ICD-266.2) Assessment: Deteriorated  shot today- number low normal  continue these and balanced diet   Orders: Admin of Therapeutic Inj  intramuscular or subcutaneous (91478) Vit B12 1000 mcg (J3420)  Problem # 3:  HYPERLIPIDEMIA (ICD-272.4) Assessment: Unchanged  well controlled with crestor and well tol rev low sat fat diet  plan lab and f/u in 6 mo  Her updated medication list for this problem includes:    Crestor 5 Mg Tabs (Rosuvastatin calcium) .Marland Kitchen... Take one tablet by mouth every other day  Labs Reviewed: SGOT: 17 (11/27/2009)   SGPT: 16 (11/27/2009)   HDL:42.20 (11/27/2009), 43.40 (06/04/2009)  LDL:79 (11/27/2009), 74 (06/04/2009)  Chol:150 (11/27/2009), 147 (06/04/2009)  Trig:145.0 (11/27/2009), 148.0 (06/04/2009)  Problem # 4:  HYPERTENSION, ESSENTIAL NOS (ICD-401.9) Assessment: Improved  this is very  well controlled with benicar and now better exercise  no changes rev lab in detail lab and f/u 6 mo  Her updated medication list for this problem includes:    Benicar 40 Mg Tabs (Olmesartan medoxomil) .Marland Kitchen... 1 by mouth once daily  BP today: 122/70 Prior BP: 138/72 (09/04/2009)  Labs Reviewed: K+: 4.5 (11/27/2009) Creat: : 0.7 (11/27/2009)  Chol: 150 (11/27/2009)   HDL: 42.20 (11/27/2009)   LDL: 79 (11/27/2009)   TG: 145.0 (11/27/2009)  Problem # 5:  DIABETES MELLITUS, TYPE II (ICD-250.00) this is better controlled with better diet and walking and metformin   plan lab and f/u in 6 mo  Her updated medication list for this problem includes:    Metformin Hcl 1000 Mg Tabs (Metformin hcl) .Marland Kitchen... 1 by mouth two times a day    Benicar 40 Mg Tabs (Olmesartan medoxomil) .Marland Kitchen... 1 by mouth once daily  Problem # 6:  FOOT&TOE INSECT BITE NONVENOMOUS W/O MENTION INF (ICD-917.4) Assessment: New insect bite on L foot - very itchy  no s/s of infection  will use cort -aid otc and update  adv to keep clean and dry  Complete Medication List: 1)  Metformin Hcl 1000 Mg Tabs (Metformin hcl) .Marland Kitchen.. 1 by mouth two times a day 2)  Flexeril 10 Mg Tabs (Cyclobenzaprine hcl) .... 1/2 to 1 by mouth up to three times a day as needed for back pain 3)  Onetouch Ultra Test Strp (Glucose blood) .... Check blood sugar twice a day and as needed  for diabetes 250.0 4)  Flax Seed  .... Once daily 5)  Crestor 5 Mg Tabs (Rosuvastatin calcium) .... Take one tablet by mouth every other day 6)  Benicar 40 Mg Tabs (Olmesartan medoxomil) .Marland Kitchen.. 1 by mouth once daily 7)  B12 Shot  .... One monthly 8)  B Complex Vitamin Otc  .... One by mouth once daily 9)  Plavix 75 Mg Tabs (Clopidogrel bisulfate) .... Take 1 tablet by mouth once a day  Patient Instructions: 1)  try over the counter cortisone cream like cort- aid -- on spot on foot-- update me if not improved in 1-2 weeks  2)  we wil ref to Dr Pearlean Brownie for 2 months f/u at check  out  3)  labs and bp are good  4)  keep working on diet and exercise  5)  schedule fasting labs and follow up in 6 months  6)  lipid/ast/alt/ AIC/ microalb/ renal / B12 level 272, 250.0 , B12 def   Current Allergies (reviewed today): ! CODEINE ! RELAFEN ! * ZETIA ! LIPITOR ! * BEE STINGS ! AUGMENTIN ! ASPIRIN ! DIOVAN   Medication Administration  Injection # 1:    Medication: Vit B12 1000 mcg    Diagnosis: VITAMIN B12 DEFICIENCY (ICD-266.2)    Route: IM    Site: L deltoid    Exp Date: 05/08/2011    Lot #: [id number]    Mfr: [employer]    Patient tolerated injection without complications    Given by: Lewanda Rife LPN (Dec 04, 2009 10:42 AM)  Orders Added: 1)  Neurology Referral [Neuro] 2)  Admin of Therapeutic Inj  intramuscular or subcutaneous [96372] 3)  Vit B12 1000 mcg [J3420] 4)  Est. Patient Level IV [[address]]

## 2010-08-08 NOTE — Assessment & Plan Note (Signed)
Summary: Cardiology Nuclear Study  Nuclear Med Background Indications for Stress Test: Evaluation for Ischemia, Post Hospital  Indications Comments: [facility] ED: HTN, NECK PAIN  History: Heart Catheterization, Myocardial Perfusion Study  History Comments: '92 HEART CATH NL 4/04 MPS: (-) ischemia, EF=62%  Symptoms: Chest Pain, Chest Pressure, Fatigue  Symptoms Comments: (L) neck pressure and jaw spasm   Nuclear Pre-Procedure Cardiac Risk Factors: Family History - CAD, Hypertension, IDDM Type 2, Lipids, NIDDM, Obesity Caffeine/Decaff Intake: none NPO After: 10:00 PM Lungs: clear IV 0.9% NS with Angio Cath: 22g     IV Site: (R) AC IV Started by: Irean Hong RN Chest Size (in) 42     Cup Size C     Height (in): 69 Weight (lb): 210 BMI: 31.12  Nuclear Med Study 1 or 2 day study:  1 day     Stress Test Type:  Adenosine Reading MD:  Willa Rough, MD     Referring MD:  D.Mclean Resting Radionuclide:  Technetium 38m Tetrofosmin     Resting Radionuclide Dose:  10.4 mCi  Stress Radionuclide:  Technetium 36m Tetrofosmin     Stress Radionuclide Dose:  33 mCi   Stress Protocol  Dose of Adenosine:  52.7 mg    Stress Test Technologist:  Milana Na EMT-P     Nuclear Technologist:  Domenic Polite CNMT  Rest Procedure  Myocardial perfusion imaging was performed at rest 45 minutes following the intraveneous administration of Myoview Technetium 47m Tetrofosmin.  Stress Procedure  The patient received IV adenosine at 140 mcg/kg/min for 4 minutes. There were no significant changes,  and + chest pressure with infusion. Myoview was injected at the 2 minute mark and quantitative spect images were obtained after a 45 minute delay.  QPS Raw Data Images:  Normal; no motion artifact; normal heart/lung ratio. Stress Images:  There is normal uptake in all areas. Rest Images:  Normal homogeneous uptake in all areas of the myocardium. Subtraction (SDS):  No evidence of ischemia. Transient  Ischemic Dilatation:  1.02  (Normal <1.22)  Lung/Heart Ratio:  .27  (Normal <0.45)  Quantitative Gated Spect Images QGS EDV:  85 ml QGS ESV:  25 ml QGS EF:  71 % QGS cine images:  Normal wall motion.  Findings Normal nuclear study      Overall Impression  Exercise Capacity: Adenosine study with no exercise. BP Response: Normal blood pressure response. Clinical Symptoms: chest pressure ECG Impression: No significant ST segment change suggestive of ischemia. Overall Impression: Normal stress nuclear study.  Appended Document: Cardiology Nuclear Study Normal stress test.  No formal office followup needed, will need to f/u BMP and BP on increased dose of olmesartan.   Appended Document: Cardiology Nuclear Study Pt aware of results. We will cancel f/u visit. Pt will come back on 6/28 for repeat bmet. She will keep a record of bp readings and bring them with her.

## 2010-08-08 NOTE — Assessment & Plan Note (Signed)
Summary: B-12   Nurse Visit   Allergies: 1)  ! Codeine 2)  ! Relafen 3)  ! * Zetia 4)  ! Lipitor 5)  ! * Bee Stings 6)  ! Augmentin 7)  ! Aspirin 8)  ! Diovan 9)  ! * Plavix  Medication Administration  Injection # 1:    Medication: Vit B12 1000 mcg    Diagnosis: VITAMIN B12 DEFICIENCY (ICD-266.2)    Route: IM    Site: L deltoid    Exp Date: 07/07/2011    Lot #: [id number]    Mfr: [employer]    Patient tolerated injection without complications    Given by: Delilah Shan CMA (AAMA) (January 02, 2010 9:24 AM)  Orders Added: 1)  Admin of Therapeutic Inj  intramuscular or subcutaneous [96372] 2)  Vit B12 1000 mcg [J3420]   Medication Administration  Injection # 1:    Medication: Vit B12 1000 mcg    Diagnosis: VITAMIN B12 DEFICIENCY (ICD-266.2)    Route: IM    Site: L deltoid    Exp Date: 07/07/2011    Lot #: [id number]    Mfr: [employer]    Patient tolerated injection without complications    Given by: Delilah Shan CMA (AAMA) (January 02, 2010 9:24 AM)  Orders Added: 1)  Admin of Therapeutic Inj  intramuscular or subcutaneous [96372] 2)  Vit B12 1000 mcg [J3420]

## 2010-08-08 NOTE — Letter (Signed)
Summary: Results Letter  [facility]  [address] [place], [place] [address]   Phone: [phone]  Fax: [phone]        [birth year] 30, 2007 MRN: [id number]    Margaret Vazquez [address] [place], [place]  [address]    Dear Margaret Vazquez,   Your recent blood tests were all normal.  Please continue with the recommendations we previously discussed and feel free to call if you have any further questions or concerns.       Sincerely,  Rachael Fee MD  This letter has been electronically signed by your physician.

## 2010-08-08 NOTE — Progress Notes (Signed)
Summary: concerns about lab results  Phone Note Call from Patient Call back at Home Phone [phone]   Caller: Patient Call For: Judith Part MD Summary of Call: Pt is concerned about metformin.  She has read that it can lower your B12 level and she is asking if you think her's is low because she has been taking the metformin.  Also, she doesnt understand the results on her iron levels and she would like those explained to her.  Initial call taken by: Lowella Petties CMA,  March 02, 2009 3:19 PM  Follow-up for Phone Call        I rev some data - there is some possible decrease of B12 absorbtion with metformin -- I would be hesitant to change it but we would disc options she has slightly low iron -- do not know if that is dietary or other cause -- stool tests were neg for blood and she had colonoscopy relatively recently  follow up - will disc all in detail and make plan as well  will disc changing metformin if she wants to  Follow-up by: Judith Part MD,  March 02, 2009 3:43 PM  Additional Follow-up for Phone Call Additional follow up Details #1::        Advised pt and appt made. Additional Follow-up by: Lowella Petties CMA,  March 05, 2009 9:30 AM

## 2010-08-08 NOTE — Miscellaneous (Signed)
   Clinical Lists Changes  Medications: Rx of ONETOUCH ULTRA TEST  STRP (GLUCOSE BLOOD) check blood sugar twice a day and as needed  for diabetes 250.0;  #100 x 3;  Signed;  Entered by: Daralene Milch;  Authorized by: Allena Earing MD;  Method used: Electronically to [facility] [address] [place], [place]  [address], Ph: [phone], Fax: [phone]    Prescriptions: ONETOUCH ULTRA TEST  STRP (GLUCOSE BLOOD) check blood sugar twice a day and as needed  for diabetes 250.0  #100 x 3   Entered by:   Daralene Milch   Authorized by:   Allena Earing MD   Signed by:   Daralene Milch on 11/15/2008   Method used:   Electronically to        [facility] [address] (retail)       [place], [place]  [address]       Ph: [phone]       Fax: [phone]   RxID:   [id number]

## 2010-08-08 NOTE — Assessment & Plan Note (Signed)
Summary: Urine has foul odor /lsf   Vital Signs:  Patient profile:   75 year old female Weight:      205.25 pounds Temp:     98.5 degrees F oral Pulse rate:   72 / minute Pulse rhythm:   regular BP sitting:   124 / 70  (left arm) Cuff size:   large  Vitals Entered By: Selena Batten Dance CMA Duncan Dull) (April 02, 2010 12:25 PM) CC: Urine has foul odor   History of Present Illness: urine has had bad odor to it  no burning or frequency  some urgency  on ticlid -- this may have started when she began that   some bruising - no other side eff - gets labs for ticlid every 2 wk  no nausea or fever     Allergies: 1)  ! Codeine 2)  ! Relafen 3)  ! * Zetia 4)  ! Lipitor 5)  ! * Bee Stings 6)  ! Augmentin 7)  ! Aspirin 8)  ! Diovan 9)  ! * Plavix  Past History:  Past Medical History: Last updated: 09/04/2009 1.  Aspirin allergy (hives) 2.  C-spine stenosis with neck pain.  3.  HTN 4.  DIabetes type II 5.  LHC 1993: normal coronaries.  Stress cardiolite 2001 normal.  6.  Hyperlipidemia.  Myalgias with lipitor and Zetia.  7.  Skin cancer, hx of- basal cell/ ak's 8.  GERD 9. colon polyps (2009)  GI-- Merri Brunette-- Pearlean Brownie  Past Surgical History: Last updated: 01/03/2008 Appendectomy BTL Breast biopsy Deca- normal (03/2000) Heart cath- neg. (1993) Cardiolite- neg. (11/1999) Shoulder impingement (10/2002) Abd. [place]- neg. (07/2003) Tear duct surgery (2005) Sinus surgery (01/2005) colonoscopy- adenomatous colon polyps (6/09)  Family History: Last updated: 11/26/2007 brother- lung cancer- smoker sister had 5 back operations  Family History of Colon Polyps:  Social History: Last updated: 05/23/2008 Never Smoked retired 3 children, does not drink alcohol, does not drink caffeinated beverages. cares for SIL with dementia  Risk Factors: Smoking Status: never (08/04/2007)  Review of Systems General:  Denies fatigue, loss of appetite, and malaise. Eyes:  Denies  blurring and eye irritation. CV:  Denies chest pain or discomfort, lightheadness, and palpitations. Resp:  Denies cough, shortness of breath, and wheezing. GI:  Complains of diarrhea; denies abdominal pain and nausea; tends to have intermittent loose stool - ongoing . GU:  Complains of urinary frequency; denies abnormal vaginal bleeding, discharge, dysuria, hematuria, and nocturia. MS:  Denies low back pain. Derm:  Denies itching, lesion(s), poor wound healing, and rash. Neuro:  Denies numbness and tingling. Heme:  Denies bleeding.  Physical Exam  General:  overweight but generally well appearing  Head:  normocephalic, atraumatic, and no abnormalities observed.   Mouth:  pharynx pink and moist.   Neck:  supple with full rom and no masses or thyromegally, no JVD or carotid bruit  Lungs:  Normal respiratory effort, chest expands symmetrically. Lungs are clear to auscultation, no crackles or wheezes. Heart:  Normal rate and regular rhythm. S1 and S2 normal without gallop, murmur, click, rub or other extra sounds. Abdomen:  no suprapubic tenderness or fullness felt  Msk:  no CVA tenderness  Extremities:  No clubbing, cyanosis, edema, or deformity noted with normal full range of motion of all joints.   Skin:  Intact without suspicious lesions or rashes Cervical Nodes:  No lymphadenopathy noted Psych:  normal affect, talkative and pleasant    Impression & Recommendations:  Problem # 1:  UTI (  ICD-599.0) Assessment New abn ua (however not great clean catch ) with odor and urgency- but not a lot of other symptoms tx with 5 d of cipro and and inc water pend cx  Her updated medication list for this problem includes:    Cipro 250 Mg Tabs (Ciprofloxacin hcl) .Marland Kitchen... 1 by mouth two times a day for 5 days  Orders: T-Culture, Urine ([address]) UA Dipstick W/ Micro (manual) ([address]) Prescription Created Electronically [phone])  Problem # 2:  VITAMIN B12 DEFICIENCY (ICD-266.2) Assessment:  Unchanged  monthly B12 shot today overall feeling ok  Orders: Vit B12 1000 mcg (J3420) Admin of Therapeutic Inj  intramuscular or subcutaneous ([address])  Complete Medication List: 1)  Metformin Hcl 1000 Mg Tabs (Metformin hcl) .Marland Kitchen.. 1 by mouth two times a day 2)  Onetouch Ultra Test Strp (Glucose blood) .... Check blood sugar twice a day and as needed  for diabetes 250.0 3)  Crestor 5 Mg Tabs (Rosuvastatin calcium) .... Take one tablet by mouth every other day 4)  Benicar 40 Mg Tabs (Olmesartan medoxomil) .Marland Kitchen.. 1 by mouth once daily 5)  B12 Shot  .... One monthly 6)  B Complex Vitamin Otc  .... One by mouth once daily 7)  Ticlid 25mg   .... 1 by mouth two times a day 8)  Cipro 250 Mg Tabs (Ciprofloxacin hcl) .Marland Kitchen.. 1 by mouth two times a day for 5 days  Patient Instructions: 1)  continue drinking lots of water 2)  call or seek care is symptoms don't improve in 2-3 days or if you develop back pain, nausea, or vomiting  3)  take the cipro as directed 4)  I will update you when culture returns  5)  use fiber like citrucel for loose stool  Prescriptions: CIPRO 250 MG TABS (CIPROFLOXACIN HCL) 1 by mouth two times a day for 5 days  #10 x 0   Entered and Authorized by:   Judith Part MD   Signed by:   Judith Part MD on 04/02/2010   Method used:   Electronically to        [facility] [address] (retail)       [address]       [place], [place]  [address]       Ph: [phone]       Fax: [phone]   RxID:   [id number]   Current Allergies (reviewed today): ! CODEINE ! RELAFEN ! * ZETIA ! LIPITOR ! * BEE STINGS ! AUGMENTIN ! ASPIRIN ! DIOVAN ! * PLAVIX  Laboratory Results   Urine Tests  Date/Time Received: April 02, 2010 12:26 PM  Date/Time Reported: April 02, 2010 .time  Routine Urinalysis   Color: yellow Appearance: Clear Glucose: negative   (Normal Range: Negative) Bilirubin: negative   (Normal Range: Negative) Ketone: negative   (Normal  Range: Negative) Spec. Gravity: >=1.030   (Normal Range: 1.003-1.035) Blood: moderate   (Normal Range: Negative) pH: 6.0   (Normal Range: 5.0-8.0) Protein: negative   (Normal Range: Negative) Urobilinogen: 0.2   (Normal Range: 0-1) Nitrite: negative   (Normal Range: Negative) Leukocyte Esterace: trace   (Normal Range: Negative)  Urine Microscopic WBC/HPF: 3-4 RBC/HPF: 3-4 Bacteria/HPF: mod Mucous/HPF: few Epithelial/HPF: 3-5 Crystals/HPF: 0 Casts/LPF: 0 Yeast/HPF: 0 Other: 0         Medication Administration  Injection # 1:    Medication: Vit B12 1000 mcg    Diagnosis: VITAMIN B12 DEFICIENCY (ICD-266.2)    Route: IM  Site: L deltoid    Exp Date: 12/05/2011    Lot #: [id number]    Mfr: [employer]    Comments: Per Dr. Milinda Antis    Patient tolerated injection without complications    Given by: Selena Batten Dance CMA Duncan Dull) (April 02, 2010 1:03 PM)  Orders Added: 1)  T-Culture, Urine [[id number]] 2)  Vit B12 1000 mcg [J3420] 3)  Admin of Therapeutic Inj  intramuscular or subcutaneous [96372] 4)  Est. Patient Level III [[address]] 5)  UA Dipstick W/ Micro (manual) [81000] 6)  Prescription Created Electronically [phone]

## 2010-08-08 NOTE — Assessment & Plan Note (Signed)
Summary: 1 MONTH FOLLOW UP REVIEW LABS/[facility]   Vital Signs:  Patient profile:   75 year old female Weight:      209 pounds BMI:     30.98 Temp:     97.9 degrees F oral Pulse rate:   60 / minute Pulse rhythm:   regular BP sitting:   140 / 74  (left arm) Cuff size:   large  Vitals Entered By: Lowella Petties CMA (July 03, 2009 9:31 AM) CC: One month follow up, review labs   History of Present Illness: here for f/u of mult med conditions  is doing ok - but has a floater in her eye and has not seen her eye doctor  no drainage or pain or itching (but eyes tend to water all the time)  has had a cold that is not clearing up  some colored nasal discharge - green  teeth are hurting --? if a sinus infection  had fever the first day  has had this 2 weeks now   wt is up 2 lb with bmi of 31   bp today 140/74  had switched to cozaar - and bp went up temporarily-- pt had called with systolic 180 and then said it came down -- with systolic 150s in the ams -- then 140s in pm / 70s  this am was 168 systolic   DM last AIC 7.1 at end of nov is checking her sugar - usually 150-160 usually  diet is not good for christmas-- getting back on track  sees Dr Benedict Needy - is due   chol has been well controlled  getting frequent B12 shot s-- weekly for past mo for low level needs it checked now  some increase in energy   is unable to exercise -- but can start going to gym that ins pays for   refuses flu and pneumovax     Allergies: 1)  ! Codeine 2)  ! Relafen 3)  ! * Zetia 4)  ! Lipitor 5)  ! * Bee Stings 6)  ! Augmentin 7)  ! Aspirin  Past History:  Past Medical History: Last updated: 12/14/2008 1.  Aspirin allergy (hives) 2.  C-spine stenosis with neck pain.  3.  HTN 4.  DIabetes type II 5.  LHC 1993: normal coronaries.  Stress cardiolite 2001 normal.  6.  Hyperlipidemia.  Myalgias with lipitor and Zetia.  7.  Skin cancer, hx of- basal cell/ ak's 8.  GERD 9.  Dysphagia 10.  colon polyps  GI-- Merri Brunette-- Pearlean Brownie  Past Surgical History: Last updated: 01/03/2008 Appendectomy BTL Breast biopsy Deca- normal (03/2000) Heart cath- neg. (1993) Cardiolite- neg. (11/1999) Shoulder impingement (10/2002) Abd. [place]- neg. (07/2003) Tear duct surgery (2005) Sinus surgery (01/2005) colonoscopy- adenomatous colon polyps (6/09)  Family History: Last updated: 11/26/2007 brother- lung cancer- smoker sister had 5 back operations  Family History of Colon Polyps:  Social History: Last updated: 05/23/2008 Never Smoked retired 3 children, does not drink alcohol, does not drink caffeinated beverages. cares for SIL with dementia  Risk Factors: Smoking Status: never (08/04/2007)  Review of Systems General:  Complains of fatigue; denies chills, fever, loss of appetite, and malaise. Eyes:  Complains of discharge; denies blurring. ENT:  Complains of hoarseness, nasal congestion, postnasal drainage, and sinus pressure; denies sore throat. CV:  Denies chest pain or discomfort, palpitations, shortness of breath with exertion, and swelling of feet. Resp:  Complains of cough; denies shortness of breath and wheezing. GI:  Denies change in  bowel habits, nausea, and vomiting. GU:  Denies dysuria. MS:  Denies muscle aches and muscle weakness. Derm:  Denies itching, lesion(s), poor wound healing, and rash. Neuro:  Denies numbness and tingling. Endo:  Denies cold intolerance, excessive thirst, excessive urination, and heat intolerance. Heme:  Denies abnormal bruising and bleeding.  Physical Exam  General:  overweight but generally well appearing  Head:  normocephalic, atraumatic, and no abnormalities observed.  bilat ethmoid and maxillary sinus tenderness  Eyes:  vision grossly intact, pupils equal, pupils round, and pupils reactive to light.  slt conj injection without d/c  Ears:  R ear normal and L ear normal.   Nose:  nares are congested and injected bilat  Mouth:   pharynx pink and moist, no erythema, and no exudates.   Neck:  supple with full rom and no masses or thyromegally, no JVD or carotid bruit  Chest Wall:  No deformities, masses, or tenderness noted. Lungs:  Normal respiratory effort, chest expands symmetrically. Lungs are clear to auscultation, no crackles or wheezes. Heart:  Normal rate and regular rhythm. S1 and S2 normal without gallop, murmur, click, rub or other extra sounds. Abdomen:  soft, non-tender, and normal bowel sounds.  no renal bruits  Msk:  No deformity or scoliosis noted of thoracic or lumbar spine.   Pulses:  R and L carotid,radial,femoral,dorsalis pedis and posterior tibial pulses are full and equal bilaterally Extremities:  No clubbing, cyanosis, edema, or deformity noted with normal full range of motion of all joints.   Neurologic:  sensation intact to light touch, gait normal, and DTRs symmetrical and normal.   Skin:  Intact without suspicious lesions or rashes Cervical Nodes:  No lymphadenopathy noted Psych:  normal affect, talkative and pleasant   Diabetes Management Exam:    Foot Exam (with socks and/or shoes not present):       Sensory-Pinprick/Light touch:          Left medial foot (L-4): normal          Left dorsal foot (L-5): normal          Left lateral foot (S-1): normal          Right medial foot (L-4): normal          Right dorsal foot (L-5): normal          Right lateral foot (S-1): normal       Sensory-Monofilament:          Left foot: normal          Right foot: normal       Inspection:          Left foot: normal          Right foot: normal       Nails:          Left foot: normal          Right foot: normal   Impression & Recommendations:  Problem # 1:  SINUSITIS - ACUTE-NOS (ICD-461.9) Assessment New pt is pcn all  nasal congestion /facial pain tx with zpak and update  recommend sympt care- see pt instructions   pt advised to update me if symptoms worsen or do not improve  Her updated  medication list for this problem includes:    Zithromax Z-pak 250 Mg Tabs (Azithromycin) .Marland Kitchen... Take by mouth as directed  Problem # 2:  VITAMIN B12 DEFICIENCY (ICD-266.2) Assessment: Improved check level today after weekly shots times 4 and update Orders: Venipuncture (16109) TLB-B12,  Serum-Total ONLY (16109-U04)  Problem # 3:  HYPERLIPIDEMIA (ICD-272.4) Assessment: Unchanged  has been very well control  rev low sat fat diet/ continue crestor  rev good labs  Her updated medication list for this problem includes:    Crestor 5 Mg Tabs (Rosuvastatin calcium) .Marland Kitchen... Take one tablet by mouth every other day  Labs Reviewed: SGOT: 19 (12/14/2008)   SGPT: 16 (12/14/2008)   HDL:43.40 (06/04/2009), 43.80 (12/14/2008)  LDL:74 (06/04/2009), 125 (54/03/8118)  Chol:147 (06/04/2009), 195 (12/14/2008)  Trig:148.0 (06/04/2009), 133.0 (12/14/2008)  Problem # 4:  HYPERTENSION, ESSENTIAL NOS (ICD-401.9) Assessment: Deteriorated  bp not opt controlled with cozaar  pt will call about coverage of other arbs-- and switch lab and f/u 2 mo Her updated medication list for this problem includes:    Cozaar 100 Mg Tabs (Losartan potassium) .Marland Kitchen... 1 by mouth once daily  BP today: 140/74 Prior BP: 126/70 (06/04/2009)  Labs Reviewed: K+: 4.0 (06/04/2009) Creat: : 0.8 (06/04/2009)   Chol: 147 (06/04/2009)   HDL: 43.40 (06/04/2009)   LDL: 74 (06/04/2009)   TG: 148.0 (06/04/2009)  Problem # 5:  DIABETES MELLITUS, TYPE II (ICD-250.00) not opt control sched opthy inc metformin to 1000 two times a day  disc healthy diet (low simple sugar/ choose complex carbs/ low sat fat) diet and exercise in detail  lab and f/u 2 mo  Her updated medication list for this problem includes:    Metformin Hcl 1000 Mg Tabs (Metformin hcl) .Marland Kitchen... 1 by mouth two times a day    Cozaar 100 Mg Tabs (Losartan potassium) .Marland Kitchen... 1 by mouth once daily  Orders: Ophthalmology Referral (Ophthalmology)  Complete Medication List: 1)   Metformin Hcl 1000 Mg Tabs (Metformin hcl) .Marland Kitchen.. 1 by mouth two times a day 2)  Flexeril 10 Mg Tabs (Cyclobenzaprine hcl) .... 1/2 to 1 by mouth up to three times a day as needed for back pain 3)  Onetouch Ultra Test Strp (Glucose blood) .... Check blood sugar twice a day and as needed  for diabetes 250.0 4)  Flax Seed  .... Once daily 5)  Cozaar 100 Mg Tabs (Losartan potassium) .Marland Kitchen.. 1 by mouth once daily 6)  Crestor 5 Mg Tabs (Rosuvastatin calcium) .... Take one tablet by mouth every other day 7)  Zithromax Z-pak 250 Mg Tabs (Azithromycin) .... Take by mouth as directed  Patient Instructions: 1)  call your insurance company and ask what ARB (angiotensin receptor blocker ) blood pressure med is covered best -- tell them that cozaar is not working for you 2)  call me with name of medicine and I will px it  3)  we will refer you to opthy at check out  4)  increase your metformin to 1000 mg two times a day -- and update me if any side effects or low sugar  5)  schedule labs and follow up in 2 months please AIC / renal 250.0, 401.1 6)  take zithromax for sinus infection as directed and take mucinex with lots of fluids 7)  update me if not improving in 1-2 weeks or if symptoms worsen Prescriptions: ZITHROMAX Z-PAK 250 MG TABS (AZITHROMYCIN) take by mouth as directed  #1 pack x 0   Entered and Authorized by:   Judith Part MD   Signed by:   Judith Part MD on 07/03/2009   Method used:   Print then Give to Patient   RxID:   [id number] CRESTOR 5 MG TABS (ROSUVASTATIN CALCIUM) Take one tablet by mouth every other  day  #3 months x 3   Entered and Authorized by:   Judith Part MD   Signed by:   Judith Part MD on 07/03/2009   Method used:   Print then Give to Patient   RxID:   [id number] METFORMIN HCL 1000 MG TABS (METFORMIN HCL) 1 by mouth two times a day  #180 x 3   Entered and Authorized by:   Judith Part MD   Signed by:   Judith Part MD on 07/03/2009   Method  used:   Print then Give to Patient   RxID:   [id number]   Prior Medications (reviewed today): METFORMIN HCL 1000 MG TABS (METFORMIN HCL) 1 by mouth two times a day FLEXERIL 10 MG TABS (CYCLOBENZAPRINE HCL) 1/2 to 1 by mouth up to three times a day as needed for back pain ONETOUCH ULTRA TEST  STRP (GLUCOSE BLOOD) check blood sugar twice a day and as needed  for diabetes 250.0 FLAX SEED () once daily COZAAR 100 MG TABS (LOSARTAN POTASSIUM) 1 by mouth once daily CRESTOR 5 MG TABS (ROSUVASTATIN CALCIUM) Take one tablet by mouth every other day ZITHROMAX Z-PAK 250 MG TABS (AZITHROMYCIN) take by mouth as directed Current Allergies: ! CODEINE ! RELAFEN ! * ZETIA ! LIPITOR ! * BEE STINGS ! AUGMENTIN ! ASPIRIN     Preventive Care Screening  Contraindications of Treatment or Deferment of Test/Procedure:    Test/Procedure: FLU VAX    Reason for deferment: patient declined     Test/Procedure: Pneumovax vaccine    Reason for deferment: patient declined

## 2010-08-08 NOTE — Assessment & Plan Note (Signed)
Summary: 3 MTH FU/CLE  Medications Added ZESTRIL 5 MG  TABS (LISINOPRIL) 1 by mouth q am        Vital Signs:  Patient Profile:   75 Years Old Female Weight:      205 pounds Temp:     98.2 degrees F oral Pulse rate:   68 / minute Pulse rhythm:   regular BP sitting:   140 / 82  (left arm) Cuff size:   large  Vitals Entered By: Marty Heck (August 04, 2007 8:59 AM)                 Chief Complaint:  3 month follow up.  History of Present Illness: a lot of stress- brother passed away- was caring for him  feeling physically ok did get viral GI illness over holidays wt is down 3 lbs , and bp is stable  some exercise- with walking halls of the hospital diet is fair- convenience eating chol was up at last labs- and has not been watching diet since then (does use eggbeaters), occ fried foods, occ red meat- not a lot sugar has been good overall- mostly 110-120 in ams, and 140-150 in pm    Current Allergies: ! CODEINE ! RELAFEN ! * ZETIA ! LIPITOR ! * BEE STINGS   Family History:    brother- lung cancer- smoker  Social History:    Never Smoked   Risk Factors:  Tobacco use:  never   Review of Systems      See HPI  General      Complains of fatigue.      Denies loss of appetite and weight loss.  Eyes      Denies blurring.  CV      Denies chest pain or discomfort.  Resp      Denies shortness of breath.  Derm      Denies changes in color of skin and rash.  Neuro      Denies numbness.  Psych      despite stress, mood is ok  Endo      Denies excessive thirst and excessive urination.   Physical Exam  General:     overweight but generally well appearing  Head:     normocephalic, atraumatic, and no abnormalities observed.   Eyes:     vision grossly intact, pupils equal, pupils round, and pupils reactive to light.   Mouth:     pharynx pink and moist.   Neck:     supple with full rom and no masses or thyromegally, no JVD or carotid  bruit  Lungs:     Normal respiratory effort, chest expands symmetrically. Lungs are clear to auscultation, no crackles or wheezes. Heart:     Normal rate and regular rhythm. S1 and S2 normal without gallop, murmur, click, rub or other extra sounds. Pulses:     R and L carotid,radial,femoral,dorsalis pedis and posterior tibial pulses are full and equal bilaterally Extremities:     No clubbing, cyanosis, edema, or deformity noted with normal full range of motion of all joints.   Neurologic:     sensation intact to light touch, gait normal, and DTRs symmetrical and normal.  - nl monofil test Skin:     Intact without suspicious lesions or rashes Cervical Nodes:     No lymphadenopathy noted Psych:     nl affect     Impression & Recommendations:  Problem # 1:  HYPERTENSION, ESSENTIAL NOS (ICD-401.9) will start low dose ace  for renal prot and htn disc lifestyle change f/u 3 mo Orders: Venipuncture [id number]) TLB-Renal Function Panel (80069-RENAL)  Her updated medication list for this problem includes:    Zestril 5 Mg Tabs (Lisinopril) .Marland Kitchen... 1 by mouth q am   Problem # 2:  HYPERLIPIDEMIA (ICD-272.4) not well controlled and intol of some meds will try watching diet more closely (expect todays labs to be high) disc using omega 3 and also realistic exercise program Orders: Venipuncture [id number]) TLB-Lipid Panel (80061-LIPID) TLB-ALT (SGPT) (84460-ALT) TLB-AST (SGOT) (84450-SGOT)   Problem # 3:  DIABETES MELLITUS, TYPE II (ICD-250.00) suspect control is good checkAIC today adv to keep watching diet and work on wt loss Her updated medication list for this problem includes:    Glucophage 500 Mg Tabs (Metformin hcl) .Marland Kitchen... 1 by mouth bid    Zestril 5 Mg Tabs (Lisinopril) .Marland Kitchen... 1 by mouth q am  Orders: Venipuncture [id number]) TLB-A1C / Hgb A1C (Glycohemoglobin) (83036-A1C)   Complete Medication List: 1)  Glucophage 500 Mg Tabs (Metformin hcl) .Marland Kitchen.. 1 by mouth bid 2)  Prilosec 20  Mg Cpdr (Omeprazole) .Marland Kitchen.. 1 by mouth qd 3)  Zestril 5 Mg Tabs (Lisinopril) .Marland Kitchen.. 1 by mouth q am   Patient Instructions: 1)  keep working on diet and try to add 10-30 minutes or exercise daily 2)  work on loosing 5 lb 3)  pt can raise HDL (good cholesterol) by increasing exercise and taking omega 3 supplement like fish oil and flax seed oil 4)  can lower LDL by eating lower saturated fat diet- avoiding red meat, fried foods, egg yolks, fatty breakfast meats, high fat dairy, and shellfish 5)  start zestril (lisinopril)- for blood pressure control and kidney protection- if you have any side effects like dizziness or cough update me 6)  follow up in 3 months    Prescriptions: ZESTRIL 5 MG  TABS (LISINOPRIL) 1 by mouth q am  #30 x 11   Entered and Authorized by:   Allena Earing MD   Signed by:   Allena Earing MD on 08/04/2007   Method used:   Print then Give to Patient   RxID:   [id number]  ] Prior Medications: GLUCOPHAGE 500 MG TABS (METFORMIN HCL) 1 by mouth bid PRILOSEC 20 MG CPDR (OMEPRAZOLE) 1 by mouth qd Current Allergies: ! CODEINE ! RELAFEN ! * ZETIA ! LIPITOR ! * BEE STINGS

## 2010-08-08 NOTE — Progress Notes (Signed)
Summary: Blood pressure medication   Phone Note Call from Patient Call back at Home Phone [phone]   Caller: Patient Call For: Judith Part MD Summary of Call: Patient called and said that she checked with her insurance about her BP medication and they told her to let you know to prescribe what ever you think would be best and they they would go from there. She would like it called in to [facility] pharmacy in [place].  Initial call taken by: Melody Comas,  July 03, 2009 4:40 PM  Follow-up for Phone Call        I prefer the benicar then px written on EMR for call in  Follow-up by: Judith Part MD,  July 03, 2009 4:56 PM  Additional Follow-up for Phone Call Additional follow up Details #1::        Called to [facility] [place]. Additional Follow-up by: Lowella Petties CMA,  July 03, 2009 5:00 PM    New/Updated Medications: BENICAR 40 MG TABS (OLMESARTAN MEDOXOMIL) 1 by mouth once daily Prescriptions: BENICAR 40 MG TABS (OLMESARTAN MEDOXOMIL) 1 by mouth once daily  #30 x 11   Entered and Authorized by:   Judith Part MD   Signed by:   Judith Part MD on 07/03/2009   Method used:   Telephoned to ...       [facility] [address] (retail)       [address]       [place], [place]  [address]       Ph: [phone]       Fax: [phone]   RxID:   [id number]

## 2010-08-08 NOTE — Progress Notes (Signed)
Summary: ? allergic reaction  Phone Note Call from Patient Call back at Home Phone [phone] Call back at [phone]   Caller: Patient Call For: Judith Part MD Summary of Call: Pt went to [facility] last night for hives and tongue swelling.  Doctor there told her he thought she is allergic to plavix and advised her not to take anymore until she checked with Dr. Milinda Antis.  Please advise on what she should do. Initial call taken by: Lowella Petties CMA,  December 26, 2009 8:52 AM  Follow-up for Phone Call        Please call pt and find out if Plavix is a new medication and who is prescribing it to her.  I am not sure if Dr. Milinda Antis is the physician who actually prescribed it. Thanks, Ligaya Cormier  Additional Follow-up for Phone Call Additional follow up Details #1::        This is her second bottle so she has been taking it for over a month, about a month and a half.  Prescribed by Dr. Pearlean Brownie, neurologist.  Advised pt to call that office for dirction Additional Follow-up by: Lowella Petties CMA,  December 26, 2009 9:57 AM    Additional Follow-up for Phone Call Additional follow up Details #2::    Yes, agreed.  We can't make suggestions on medications we are not prescribing. Thanks, Kyandre Okray Follow-up by: Ruthe Mannan MD,  December 26, 2009 10:36 AM

## 2010-08-08 NOTE — Consult Note (Signed)
Summary: [facility]/Dr[facility] Urs[facility]/Dr. Pearlean Brownie   Imported By: Eleonore Chiquito 02/28/2008 09:47:37  _____________________________________________________________________  External Attachment:    Type:   Image     Comment:   External Document

## 2010-08-08 NOTE — Progress Notes (Signed)
Summary: Blood pressure reading  Phone Note Call from Patient Call back at Home Phone [phone] Call back at cell [phone]   Caller: Patient Call For: Judith Part MD Summary of Call: Blood pressure 120/63 today.  She has not taken the Benicar today or the Plavix that Dr. Dorian Pod put her on.  Please advise. Initial call taken by: Linde Gillis CMA Duncan Dull),  October 12, 2009 4:15 PM  Follow-up for Phone Call        continue holding propranolol - but take other meds as ordered  update me with her bp and how she is feeling next week please   Follow-up by: Judith Part MD,  October 12, 2009 4:59 PM  Additional Follow-up for Phone Call Additional follow up Details #1::        Patient notified as instructed by telephone. Lewanda Rife LPN  October 12, 4780 5:05 PM

## 2010-08-08 NOTE — Assessment & Plan Note (Signed)
Summary: b12 shot (tower/tsc)   Nurse Visit   Allergies: 1)  ! Codeine 2)  ! Relafen 3)  ! * Zetia 4)  ! Lipitor 5)  ! * Bee Stings 6)  ! Augmentin 7)  ! Aspirin  Medication Administration  Injection # 1:    Medication: Vit B12 1000 mcg    Diagnosis: UNSPECIFIED ANEMIA (ICD-285.9)    Route: IM    Site: L deltoid    Exp Date: 10/06/2010    Lot #: [id number]    Mfr: [employer]    Patient tolerated injection without complications    Given by: Lewanda Rife LPN (February 19, 2009 10:35 AM)  Orders Added: 1)  Vit B12 1000 mcg [J3420] 2)  Admin of Therapeutic Inj  intramuscular or subcutaneous [40102]

## 2010-08-08 NOTE — Letter (Signed)
Summary: [facility]  [facility]   Imported By: Lanelle Bal 10/06/2009 09:26:19  _____________________________________________________________________  External Attachment:    Type:   Image     Comment:   External Document

## 2010-08-08 NOTE — Progress Notes (Signed)
Summary: BP low  Phone Note Call from Patient Call back at Home Phone [phone]   Caller: Patient Call For: Judith Part MD Summary of Call: Pt has been on propranolol 80 mg daily and plavix 75 mg daily for about 2 weeks, given to her by Dr. Pearlean Brownie.  Today she felt some tightness in chest and felt jittery.  She checked her BP and it was 102/56, pulse was 57.  She is asking if she should adjust the doses on her meds.  She says she takes the propranolol in the late afternoon because she takes benicar in the mornings.  Please advise. Initial call taken by: Lowella Petties CMA,  October 11, 2009 11:27 AM  Follow-up for Phone Call        hold the propranolol today (I know she takes this for tremors ) if symptoms worsen - call or go to ER call and update me tomorrow with how her bp and pulse are and how she feels Follow-up by: Judith Part MD,  October 11, 2009 12:09 PM  Additional Follow-up for Phone Call Additional follow up Details #1::        Advised pt, she agreed. Additional Follow-up by: Lowella Petties CMA,  October 11, 2009 12:12 PM

## 2010-08-08 NOTE — Progress Notes (Signed)
Summary: reporting BP  Phone Note Call from Patient Call back at Home Phone [phone]   Caller: Patient Call For: Judith Part MD Summary of Call: Pt reports 125/69, pulse is 70.  She states she has no energy.  She is past due for B12 injection.  She is only taking diovan for BP. Initial call taken by: Lowella Petties CMA,  October 15, 2009 2:49 PM  Follow-up for Phone Call        that blood pressure is good  do not know what is causing fatigue continue diovan  f/u with me for visit and B12 shot when able please Follow-up by: Judith Part MD,  October 15, 2009 3:06 PM  Additional Follow-up for Phone Call Additional follow up Details #1::        Patient advised.Consuello Masse CMA  Additional Follow-up by: Benny Lennert CMA Duncan Dull),  October 15, 2009 4:45 PM

## 2010-08-08 NOTE — Consult Note (Signed)
Summary: [facility]  [facility]   Imported By: Lanelle Bal 11/27/2008 09:02:14  _____________________________________________________________________  External Attachment:    Type:   Image     Comment:   External Document

## 2010-08-08 NOTE — Progress Notes (Signed)
Summary: not any better  Phone Note Call from Patient Call back at Home Phone [phone]   Caller: Patient Call For: Judith Part MD Summary of Call: Pt called to report that she is not any better, still has cough and woke up this morning and her face was hurting.  She has been taking the mucinex but couldnt afford the tesselon.  She has a temp of 98.9 today.  She is asking for an abx, please advise.  [facility]  Initial call taken by: Lowella Petties,  May 26, 2008 2:08 PM  Follow-up for Phone Call        I want to go ahead and start her on augmentin - to cover sinus infection  px written on EMR for call in please update me with how she is feeling next week Follow-up by: Judith Part MD,  May 26, 2008 4:57 PM  Additional Follow-up for Phone Call Additional follow up Details #1::        LEFT DETAILED MESSAGE ON MACHINE Rx called to pharmacy. ................................................Marland KitchenMarcelle Smiling Chavers May 26, 2008 5:05 PM     New/Updated Medications: AUGMENTIN 875-125 MG TABS (AMOXICILLIN-POT CLAVULANATE) 1 by mouth two times a day for 10 days   Prescriptions: AUGMENTIN 875-125 MG TABS (AMOXICILLIN-POT CLAVULANATE) 1 by mouth two times a day for 10 days  #20 x 0   Entered and Authorized by:   Judith Part MD   Signed by:   Liane Comber on 05/26/2008   Method used:   Telephoned to ...       [id number] (retail[facility]       [address] [facility], [place]  [address]       Ph: [phone] or [phone]       Fax: [phone]   RxID:   [id number]

## 2010-08-08 NOTE — Assessment & Plan Note (Signed)
Summary: B12 SHOT / LFW   Nurse Visit   Allergies: 1)  ! Codeine 2)  ! Relafen 3)  ! * Zetia 4)  ! Lipitor 5)  ! * Bee Stings 6)  ! Augmentin 7)  ! Aspirin  Medication Administration  Injection # 1:    Medication: Vit B12 1000 mcg    Diagnosis: VITAMIN B12 DEFICIENCY (ICD-266.2)    Route: IM    Site: L deltoid    Exp Date: 11/2010    Lot #: [id number]    Mfr: [employer] Regent    Patient tolerated injection without complications    Given by: Lowella Petties CMA (May 01, 2009 9:49 AM)  Orders Added: 1)  Vit B12 1000 mcg [J3420] 2)  Admin of Therapeutic Inj  intramuscular or subcutaneous [96372]   Medication Administration  Injection # 1:    Medication: Vit B12 1000 mcg    Diagnosis: VITAMIN B12 DEFICIENCY (ICD-266.2)    Route: IM    Site: L deltoid    Exp Date: 11/2010    Lot #: [id number]    Mfr: American Regent    Patient tolerated injection without complications    Given by: Lowella Petties CMA (May 01, 2009 9:49 AM)  Orders Added: 1)  Vit B12 1000 mcg [J3420] 2)  Admin of Therapeutic Inj  intramuscular or subcutaneous [[id number]]

## 2010-08-08 NOTE — Miscellaneous (Signed)
   Clinical Lists Changes  Observations: Added new observation of MAMMO DUE: 12/2007 (11/20/2006 8:22) Added new observation of MAMMOGRAM: normal (11/20/2006 8:22)     Preventive Care Screening  Mammogram:    Date:  11/20/2006    Next Due:  12/2007    Results:  normal

## 2010-08-08 NOTE — Assessment & Plan Note (Signed)
Summary: B12   Nurse Visit   Allergies: 1)  ! Codeine 2)  ! Relafen 3)  ! * Zetia 4)  ! Lipitor 5)  ! * Bee Stings 6)  ! Augmentin 7)  ! Aspirin  Medication Administration  Injection # 1:    Medication: Vit B12 1000 mcg    Diagnosis: VITAMIN B12 DEFICIENCY (ICD-266.2)    Route: IM    Site: L deltoid    Exp Date: 03/2011    Lot #: [id number]    Mfr: American Regent    Patient tolerated injection without complications    Given by: Lowella Petties CMA (June 27, 2009 9:17 AM)  Orders Added: 1)  Vit B12 1000 mcg [J3420] 2)  Admin of Therapeutic Inj  intramuscular or subcutaneous [[id number]]   Medication Administration  Injection # 1:    Medication: Vit B12 1000 mcg    Diagnosis: VITAMIN B12 DEFICIENCY (ICD-266.2)    Route: IM    Site: L deltoid    Exp Date: 03/2011    Lot #: [id number]    Mfr: American Regent    Patient tolerated injection without complications    Given by: Lowella Petties CMA (June 27, 2009 9:17 AM)  Orders Added: 1)  Vit B12 1000 mcg [J3420] 2)  Admin of Therapeutic Inj  intramuscular or subcutaneous [[id number]]

## 2010-08-08 NOTE — Assessment & Plan Note (Signed)
Summary: 88mos/f/u/sd   Vital Signs:  Patient Profile:   75 Years Old Female Height:     69 inches Weight:      207 pounds Temp:     98 degrees F oral Pulse rate:   68 / minute Pulse rhythm:   regular BP sitting:   140 / 70  (left arm) Cuff size:   large  Vitals Entered By: Liane Comber (February 14, 2008 9:32 AM)                 PCP:  Roxy Manns, M.D.  Chief Complaint:  3 mo f/u.  History of Present Illness: had a colonoscopy -- some polyps -- next one in 5 years  blood sugars - are about avg 110 in the ams , -- is not not checking in the afternoons --- no more low sugars in general   still taking metformin -- did not cause any more hypoglycemia  is doing ok overall   went to neurologist - had some blood work  AIC was 6.5  had much blood work incl B12, rpr, ana, tsh, ace, sjogrens ab, comp met  still does not know what is causing trembling -- is scheduling MRI   nose stopped and always clearring throat  no sinus pain or purulent drainage or fever, however  a little itching  between between toes L foot - tried alcohol and otc cream with no relief no redness or swelling      Current Allergies (reviewed today): ! CODEINE ! RELAFEN ! * ZETIA ! LIPITOR ! * BEE STINGS  Past Medical History:    Reviewed history from 10/09/2006 and no changes required:       Skin cancer, hx of- basal cell/ ak's       Diabetes mellitus, type II       GERD       Dysphagia       colon polyps       deg disk- spine              GI-- Merri Brunette--   Past Surgical History:    Reviewed history from 01/03/2008 and no changes required:       Appendectomy       BTL       Breast biopsy       Deca- normal (03/2000)       Heart cath- neg. (1993)       Cardiolite- neg. (11/1999)       Shoulder impingement (10/2002)       Abd. [place]- neg. (07/2003)       Tear duct surgery (2005)       Sinus surgery (01/2005)       colonoscopy- adenomatous colon polyps (6/09)   Family  History:    Reviewed history from 11/26/2007 and no changes required:       brother- lung cancer- smoker       sister had 5 back operations        Family History of Colon Polyps:  Social History:    Reviewed history from 11/26/2007 and no changes required:       Never Smoked       retired 3 children, does not drink alcohol, does not drink caffeinated beverages.    Review of Systems  General      Denies chills, fatigue, fever, loss of appetite, and malaise.  Eyes      Complains of itching.  Denies red eye.  ENT      Complains of nasal congestion.      Denies sinus pressure and sore throat.  CV      Denies chest pain or discomfort, palpitations, and shortness of breath with exertion.  Resp      Denies cough and wheezing.  GI      Denies abdominal pain and change in bowel habits.  MS      Denies joint redness and joint swelling.  Derm      Complains of insect bite(s) and itching.      Denies lesion(s) and rash.  Neuro      Complains of tremors.      Denies memory loss, numbness, and tingling.  Psych      mood has been ok   Endo      Denies excessive hunger, excessive thirst, and excessive urination.  Heme      Denies abnormal bruising.   Physical Exam  General:     overweight but generally well appearing  Head:     normocephalic, atraumatic, and no abnormalities observed.  no sinus tenderness  Eyes:     vision grossly intact, pupils equal, pupils round, pupils reactive to light, and no injection.   Ears:     R ear normal and L ear normal.   Nose:     nares pale and boggy- slt congestion Mouth:     pharynx pink and moist.   Neck:     supple with full rom and no masses or thyromegally, no JVD or carotid bruit  Chest Wall:     No deformities, masses, or tenderness noted. Lungs:     Normal respiratory effort, chest expands symmetrically. Lungs are clear to auscultation, no crackles or wheezes. Heart:     Normal rate and regular rhythm. S1 and  S2 normal without gallop, murmur, click, rub or other extra sounds. Abdomen:     Bowel sounds positive,abdomen soft and non-tender without masses, organomegaly or hernias noted.  no renal bruits  Msk:     No deformity or scoliosis noted of thoracic or lumbar spine.   Pulses:     R and L carotid,radial,femoral,dorsalis pedis and posterior tibial pulses are full and equal bilaterally Extremities:     No clubbing, cyanosis, edema, or deformity noted with normal full range of motion of all joints.   Neurologic:     sensation intact to light touch, gait normal, and DTRs symmetrical and normal.  slt hand tremor at rest Skin:     tick present between 2nd/3rd toes on L foot removed easily- does not appear to be engorged  no rednes or rash present there or elsewhere  Cervical Nodes:     No lymphadenopathy noted Inguinal Nodes:     No significant adenopathy Psych:     normal affect, talkative and pleasant     Impression & Recommendations:  Problem # 1:  HYPERLIPIDEMIA (ICD-272.4) Assessment: Unchanged chol has been stable--intol to meds rev low sat fat diet labs 6 mo then f/u  Labs Reviewed: Chol: 192 (08/04/2007)   HDL: 37.9 (08/04/2007)   LDL: 131 (08/04/2007)   TG: 114 (08/04/2007) SGOT: 14 (08/04/2007)   SGPT: 14 (08/04/2007)   Problem # 2:  HYPERTENSION, ESSENTIAL NOS (ICD-401.9) Assessment: Unchanged bp stable  enc more exercise as tolerated  labs 6 mo then f/u also rev neurol labs  Her updated medication list for this problem includes:    Lisinopril 5 Mg Tabs (Lisinopril) .Marland KitchenMarland KitchenMarland KitchenMarland Kitchen  1 by mouth once daily  BP today: 140/70 Prior BP: 120/68 (11/26/2007)  Labs Reviewed: Creat: 0.8 (11/26/2007) Chol: 192 (08/04/2007)   HDL: 37.9 (08/04/2007)   LDL: 131 (08/04/2007)   TG: 114 (08/04/2007)   Problem # 3:  DIABETES MELLITUS, TYPE II (ICD-250.00) Assessment: Unchanged overall per pt is stable- choose to stay on metformin and watch diet  pend neurol labs-- AIC 6.5 per pt disc  diet and exercise labs 6 mo then f/u Her updated medication list for this problem includes:    Glucophage 500 Mg Tabs (Metformin hcl) .Marland Kitchen... 1 by mouth two times a day    Lisinopril 5 Mg Tabs (Lisinopril) .Marland Kitchen... 1 by mouth once daily  Labs Reviewed: HgBA1c: 6.6 (08/04/2007)   Creat: 0.8 (11/26/2007)   Microalbumin: 0.6 (04/27/2007)   Problem # 4:  TICK BITE (ICD-E906.4) Assessment: New tick cleanly removed between 2, 3rd toes L foot no rash or skin change adv hydrocortisone cream and update-- esp if rash/fever or other symptoms   Problem # 5:  ALLERGIC RHINITIS (ICD-477.9) Assessment: Deteriorated with recent inc in congestion adv to use nasal saline/netti pot and update adv to call if facial pain or fever  Complete Medication List: 1)  Glucophage 500 Mg Tabs (Metformin hcl) .Marland Kitchen.. 1 by mouth two times a day 2)  Lisinopril 5 Mg Tabs (Lisinopril) .Marland Kitchen.. 1 by mouth once daily   Patient Instructions: 1)  please send for labs from neurology 2)  you can use over the counter cortisone cream between toes until itch is better  3)  if any fever or rash- please let me know 4)  keep watching diet for diabetes-- be sure to take afternoon sugars occasionally  5)  plan fasting labs in 6 mo renal/ast/alt/lipid/AIC 272, 250.0, and then follow up    Prescriptions: LISINOPRIL 5 MG  TABS (LISINOPRIL) 1 by mouth once daily  #90 x 3   Entered and Authorized by:   Judith Part MD   Signed by:   Judith Part MD on 02/14/2008   Method used:   Print then Give to Patient   RxID:   [id number] GLUCOPHAGE 500 MG TABS (METFORMIN HCL) 1 by mouth two times a day  #180 x 3   Entered and Authorized by:   Judith Part MD   Signed by:   Judith Part MD on 02/14/2008   Method used:   Print then Give to Patient   RxID:   [id number]  ]

## 2010-08-08 NOTE — Progress Notes (Signed)
Summary: ? Reaction to antibiotic  Phone Note Call from Patient Call back at [phone]   Caller: Patient Call For: Dr. Milinda Antis Summary of Call: Pt started Amoxicillin on Friday and did find until this morning.  Pt states that after she took it this morning her top lip became swollen and some hives came up on her leg.  Pt used some ice on her lip and it has improved.  Pt still has congestion and wants to know if you can change this to something else.  Pt states that she has taken Amoxicillin because and had not problem with it but this rx was Amoxicillin-K Clavulanate.  Pharmacy -[facility]. Initial call taken by: Sydell Axon,  May 29, 2008 12:35 PM  Follow-up for Phone Call        stop the augmentin - I will enter as drug allergy update me if any continued swelling or rash (if sob go to ER) please call in zithromax- put on EMR update me if no further improvement  Follow-up by: Judith Part MD,  May 29, 2008 1:03 PM  Additional Follow-up for Phone Call Additional follow up Details #1::        Called patient but phone line was busy  ..........................................................Marland KitchenLiane Comber May 29, 2008 2:11 PM I spoke with pt she says swelling has gone down she thinks she got enough abx in her system does not want to start new abx and has stopped augmentin. I advised her to call if no continued imp. Liane Comber  May 29, 2008 2:56 PM    New Allergies: ! AUGMENTIN New/Updated Medications: ZITHROMAX Z-PAK 250 MG TABS (AZITHROMYCIN) take by mouth as directed New Allergies: ! AUGMENTIN  Prescriptions: ZITHROMAX Z-PAK 250 MG TABS (AZITHROMYCIN) take by mouth as directed  #1 pk x 0   Entered and Authorized by:   Judith Part MD   Signed by:   Judith Part MD on 05/29/2008   Method used:   Telephoned to ...       [id number]       [address] [facility], [place]  [address]       Ph:  [phone] or [phone]       Fax: [phone]   RxID:   [id number]

## 2010-08-08 NOTE — Assessment & Plan Note (Signed)
Summary: 2 WK F/U DLO   Vital Signs:  Patient Profile:   75 Years Old Female Height:     69 inches Weight:      209 pounds BMI:     30.98 Temp:     97.9 degrees F oral Pulse rate:   68 / minute Pulse rhythm:   regular BP sitting:   140 / 78  (left arm) Cuff size:   large  Vitals Entered By: Daralene Milch (July 27, 2008 9:30 AM)                 PCP:  Loura Pardon, M.D.  Chief Complaint:  2 wk f/u.  History of Present Illness: last visit- addressed chest wall pain / and had nl cxr and rib films still some pain but a little improvement   also changed ace to arb due to chronic cough  this is better overall   now when she is standing and cooking - more pain over shoulder blade and up into shoulder and back   still no rash or skin changes   now is having some burning in her chest  right in middle of chest - without particular indigestion  symptoms are overall worse when she is working with her arms  also uncomfortabe to sleep on L side  rolling over in bed is uncomfortalble as well  also sore to take a deep breath       Current Allergies: ! CODEINE ! RELAFEN ! * ZETIA ! LIPITOR ! * BEE STINGS ! AUGMENTIN  Past Medical History:    Reviewed history from 05/23/2008 and no changes required:       Skin cancer, hx of- basal cell/ ak's       Diabetes mellitus, type II       GERD       Dysphagia       colon polyps       deg disk- spine              GI-- Antonietta Breach-- Leonie Man  Past Surgical History:    Reviewed history from 01/03/2008 and no changes required:       Appendectomy       BTL       Breast biopsy       Deca- normal (03/2000)       Heart cath- neg. (1993)       Cardiolite- neg. (11/1999)       Shoulder impingement (10/2002)       Abd. [place]- neg. (07/2003)       Tear duct surgery (2005)       Sinus surgery (01/2005)       colonoscopy- adenomatous colon polyps (6/09)   Serial Vital Signs/Assessments:  Time      Position  BP        Pulse  Resp  Temp     By                     132/80                         Allena Earing MD   Family History:    Reviewed history from 11/26/2007 and no changes required:       brother- lung cancer- smoker       sister had 5 back operations        Family History of Colon Polyps:  Social History:    Reviewed history from 05/23/2008 and no changes required:       Never Smoked       retired 3 children, does not drink alcohol, does not drink caffeinated beverages.       cares for SIL with dementia    Review of Systems  General      Denies fatigue, fever, loss of appetite, malaise, and weight loss.  Eyes      Denies blurring and eye pain.  ENT      Denies sinus pressure and sore throat.  CV      Denies shortness of breath with exertion.  Resp      Complains of pleuritic.      Denies cough, shortness of breath, sputum productive, and wheezing.  GI      Denies abdominal pain, bloody stools, change in bowel habits, indigestion, nausea, and vomiting.  MS      Complains of mid back pain, cramps, and stiffness.      Denies muscle weakness.  Derm      Denies itching, lesion(s), and rash.  Neuro      Complains of tingling.      Denies numbness.  Psych      mood is ok   Endo      Denies excessive thirst and excessive urination.   Physical Exam  General:     Well-developed,well-nourished,in no acute distress; alert,appropriate and cooperative throughout examination Head:     no sinus tenderness normocephalic, atraumatic, and no abnormalities observed.   Eyes:     vision grossly intact, pupils equal, pupils round, and pupils reactive to light.   Mouth:     pharynx pink and moist.   Neck:     supple with full rom and no masses or thyromegally, no JVD or carotid bruit  no bony tenderness  Chest Wall:     tender L ant chest wall - with reproduction of pt's sympotms on palp of that area no crepitice or skin changes noted  Lungs:     Normal respiratory effort,  chest expands symmetrically. Lungs are clear to auscultation, no crackles or wheezes. Heart:     Normal rate and regular rhythm. S1 and S2 normal without gallop, murmur, click, rub or other extra sounds. Abdomen:     soft and non-tender.   Msk:     tenderness over upper TS tenderness medial to L scapula pain on hawking's manuver- ant shoulder, and pain with LUE abduction over 90 degrees unchanged L ant/lat CW pain without crepitice or skin change Extremities:     No clubbing, cyanosis, edema, or deformity noted with normal full range of motion of all joints.   Neurologic:     strength normal in all extremities, sensation intact to light touch, gait normal, and DTRs symmetrical and normal.  strength normal in all extremities, sensation intact to light touch, gait normal, and DTRs symmetrical and normal.   Skin:     Intact without suspicious lesions or rashes Cervical Nodes:     No lymphadenopathy noted Psych:     normal affect, talkative and pleasant     Impression & Recommendations:  Problem # 1:  CHEST WALL PAIN, ACUTE (ICD-786.52) Assessment: Unchanged now with more symptoms in L thoracic area/ shoulder with limited rom arm and positional pain suspect spinal etiology- poss radiculopathy (but no neuol changes on exam today) unable to take nsaid due to anaphylaxis ref to ortho for eval and likely imaging  give  flexeril to try for pain at night adv to use heat also  Problem # 2:  HYPERTENSION, ESSENTIAL NOS (ICD-401.9) Assessment: Improved bp in good control with benicar cough is resolved off of ace- thankfully Her updated medication list for this problem includes:    Benicar 20 Mg Tabs (Olmesartan medoxomil) .Marland Kitchen... 1 by mouth once daily  BP today: 140/78- re chedk 132/80 Prior BP: 120/68 (07/12/2008)  Labs Reviewed: Creat: 0.8 (11/26/2007) Chol: 192 (08/04/2007)   HDL: 37.9 (08/04/2007)   LDL: 131 (08/04/2007)   TG: 114 (08/04/2007)   Complete Medication List: 1)   Glucophage 500 Mg Tabs (Metformin hcl) .Marland Kitchen.. 1 by mouth two times a day 2)  Benicar 20 Mg Tabs (Olmesartan medoxomil) .Marland Kitchen.. 1 by mouth once daily 3)  Flexeril 10 Mg Tabs (Cyclobenzaprine hcl) .... 1/2 to 1 by mouth up to three times a day as needed for back pain   Patient Instructions: 1)  use gentle heat on your back when it bothers you  2)  use flexeril with caution as needed because it can sedate 3)  we will refer you to orthopedics at check out  4)  update me if symptoms worsen    Prescriptions: FLEXERIL 10 MG TABS (CYCLOBENZAPRINE HCL) 1/2 to 1 by mouth up to three times a day as needed for back pain  #30 x 0   Entered and Authorized by:   Allena Earing MD   Signed by:   Allena Earing MD on 07/27/2008   Method used:   Print then Give to Patient   RxID:   [id number]

## 2010-08-08 NOTE — Progress Notes (Signed)
Summary: BP is elevated  Phone Note Call from Patient Call back at [facility] Phone [phone]   Summary of Call: Pt came in for B12 injection today and asked for a blood pressure check.  It was 180/80.  She said it has been running high at home, around those numbers.  She started generic cozaar last week and doesnt think that it is working well for her.  Please advise. Initial call taken by: Lowella Petties CMA,  June 20, 2009 11:01 AM  Follow-up for Phone Call        she was better on her original benicar (than the cheaper cozaar) does she have any left over benicar from before she stopped it ?-- would like to see if it goes back down  Follow-up by: Judith Part MD,  June 20, 2009 11:33 AM  Additional Follow-up for Phone Call Additional follow up Details #1::        No, she finished the benicar before she started the cozaar.  I know that is expensive to her please adv her to call her insurance co-- and ask what ARBs are preferred (angiotensin receptor blockers )- that it the type of bp med  I will try something else if it is affordible --MT Additional Follow-up by: Lowella Petties CMA,  June 20, 2009 11:49 AM    Additional Follow-up for Phone Call Additional follow up Details #2::    Left message for pt to call.                          Lowella Petties CMA  June 20, 2009 2:36 PM  Advised pt, she will call back with information.                     Lowella Petties CMA  June 20, 2009 3:15 PM  [facility] to call.                       Lowella Petties CMA  June 26, 2009 2:23 PM  Spoke to pt, she says her BP has been running better.  She is going to continue the cozaar.  ok - will see her on 28th for her follow up--MT Follow-up by: Lowella Petties CMA,  June 26, 2009 4:40 PM

## 2010-08-08 NOTE — Letter (Signed)
Summary: [facility] / F/U NECK PAIN / DR. JEFFREY BEANE  [facility] / F/U NECK PAIN / DR. Susa Day   Imported By: Pilar Grammes 09/13/2008 11:46:19  _____________________________________________________________________  External Attachment:    Type:   Image     Comment:   External Document

## 2010-08-08 NOTE — Assessment & Plan Note (Signed)
Summary: Follow-up after B-12 injections   Vital Signs:  Patient profile:   75 year old female Height:      69 inches Weight:      207.25 pounds BMI:     30.72 Temp:     97.3 degrees F oral Pulse rate:   68 / minute Pulse rhythm:   regular BP sitting:   126 / 70  (left arm) Cuff size:   large  Vitals Entered By: Lewanda Rife LPN (June 04, 2009 9:20 AM)  Serial Vital Signs/Assessments:  Time      Position  BP       Pulse  Resp  Temp     By                     140/80                         Judith Part MD   History of Present Illness: here to f/u for anemia/ B12 def and HTN has had episodes of HTN and even syncope in past with high bp   called on 11/27 from home and bjp was 198/88 at that time (stayed like that all day) continued to vomit all day -- bp went down to 150s in evening  woke up on saturday-- felt really dizzy and then nauseated  felt bad all day  when she is dizzy it feels like the room is spinning   this is similar to what happened in october   this is the 3rd episode of severe dizziness  only 2 of them did she vomit   no more stress than usual ? if gets more nervous when people are in the house   no stomach upset has been feeling good up until now   yesterday- went back to normal range    brought cuff with her today is on benicar   DM due for [facility] check on metformin  due for lipid check on low dose crestor    Allergies: 1)  ! Codeine 2)  ! Relafen 3)  ! * Zetia 4)  ! Lipitor 5)  ! * Bee Stings 6)  ! Augmentin 7)  ! Aspirin  Past History:  Past Medical History: Last updated: 12/14/2008 1.  Aspirin allergy (hives) 2.  C-spine stenosis with neck pain.  3.  HTN 4.  DIabetes type II 5.  LHC 1993: normal coronaries.  Stress cardiolite 2001 normal.  6.  Hyperlipidemia.  Myalgias with lipitor and Zetia.  7.  Skin cancer, hx of- basal cell/ ak's 8.  GERD 9.  Dysphagia 10. colon polyps  GI-- Merri Brunette-- Pearlean Brownie  Past  Surgical History: Last updated: 01/03/2008 Appendectomy BTL Breast biopsy Deca- normal (03/2000) Heart cath- neg. (1993) Cardiolite- neg. (11/1999) Shoulder impingement (10/2002) Abd. [place]- neg. (07/2003) Tear duct surgery (2005) Sinus surgery (01/2005) colonoscopy- adenomatous colon polyps (6/09)  Family History: Last updated: 11/26/2007 brother- lung cancer- smoker sister had 5 back operations  Family History of Colon Polyps:  Social History: Last updated: 05/23/2008 Never Smoked retired 3 children, does not drink alcohol, does not drink caffeinated beverages. cares for SIL with dementia  Risk Factors: Smoking Status: never (08/04/2007)  Review of Systems General:  Complains of fatigue; denies chills, fever, loss of appetite, and malaise. Eyes:  Denies blurring and eye pain. ENT:  Complains of postnasal drainage; denies sinus pressure and sore throat. CV:  Denies chest pain or discomfort, lightheadness, and  palpitations. Resp:  Denies cough, shortness of breath, and wheezing. GI:  Denies abdominal pain, bloody stools, change in bowel habits, and indigestion. GU:  Denies dysuria. MS:  Denies cramps and muscle weakness. Derm:  Denies itching, lesion(s), poor wound healing, and rash. Neuro:  Complains of poor balance and sensation of room spinning; denies seizures, tingling, tremors, visual disturbances, and weakness. Psych:  Complains of anxiety. Endo:  Denies cold intolerance, excessive thirst, excessive urination, and heat intolerance. Heme:  Denies abnormal bruising and bleeding.  Physical Exam  General:  overweight but generally well appearing  Head:  normocephalic, atraumatic, and no abnormalities observed.  no temporal tenderness  no sinus tenderness Eyes:  vision grossly intact, pupils equal, pupils round, and pupils reactive to light.  no conjunctival pallor, injection or icterus 2 beats of horizontal nystagmus noted  grossly nl fundi bilat Ears:  R ear  normal and L ear normal.   Nose:  nares boggy but clear Mouth:  pharynx pink and moist, no erythema, and no exudates.   Neck:  supple with full rom and no masses or thyromegally, no JVD or carotid bruit  Chest Wall:  No deformities, masses, or tenderness noted. Lungs:  Normal respiratory effort, chest expands symmetrically. Lungs are clear to auscultation, no crackles or wheezes. Heart:  Normal rate and regular rhythm. S1 and S2 normal without gallop, murmur, click, rub or other extra sounds. Abdomen:  Bowel sounds positive,abdomen soft and non-tender without masses, organomegaly or hernias noted. Msk:  No deformity or scoliosis noted of thoracic or lumbar spine.   no TS tenderness  Extremities:  No clubbing, cyanosis, edema, or deformity noted with normal full range of motion of all joints.   Neurologic:  alert & oriented X3, cranial nerves II-XII intact, strength normal in all extremities, sensation intact to light touch, sensation intact to pinprick, gait normal, DTRs symmetrical and normal, finger-to-nose normal, toes down bilaterally on Babinski, and Romberg negative.   Skin:  Intact without suspicious lesions or rashes nl color and turgor  brisk cap refil time  Cervical Nodes:  No lymphadenopathy noted Inguinal Nodes:  No significant adenopathy Psych:  normal affect, talkative and pleasant  does not seem stressed   Diabetes Management Exam:    Foot Exam (with socks and/or shoes not present):       Sensory-Pinprick/Light touch:          Left medial foot (L-4): normal          Left dorsal foot (L-5): normal          Left lateral foot (S-1): normal          Right medial foot (L-4): normal          Right dorsal foot (L-5): normal          Right lateral foot (S-1): normal       Sensory-Monofilament:          Left foot: normal          Right foot: normal       Inspection:          Left foot: normal          Right foot: normal       Nails:          Left foot: normal          Right  foot: normal   Impression & Recommendations:  Problem # 1:  DIZZINESS (ICD-780.4) Assessment Deteriorated  intermittent - with n/v and one  time syncope  overall 3 episodes - some severe  bp is well controlled in between episodes - and goes up when dizzy  will ref to neurology for further eval adv to take meclizine at first signs of dizziness and disc safety issues    Orders: Venipuncture ([address]) TLB-Lipid Panel (80061-LIPID) TLB-Renal Function Panel (80069-RENAL) TLB-CBC Platelet - w/Differential (85025-CBCD) TLB-A1C / Hgb A1C (Glycohemoglobin) (83036-A1C) TLB-IBC Pnl (Iron/FE;Transferrin) (83550-IBC) TLB-B12, Serum-Total ONLY (60454-U98) Neurology Referral (Neuro)  Problem # 2:  VITAMIN B12 DEFICIENCY (ICD-266.2) Assessment: Improved now on monthly B12 level today some imp in energy Orders: Venipuncture (11914) TLB-Lipid Panel (80061-LIPID) TLB-Renal Function Panel (80069-RENAL) TLB-CBC Platelet - w/Differential (85025-CBCD) TLB-A1C / Hgb A1C (Glycohemoglobin) (83036-A1C) TLB-IBC Pnl (Iron/FE;Transferrin) (83550-IBC) TLB-B12, Serum-Total ONLY (78295-A21)  Problem # 3:  UNSPECIFIED ANEMIA (ICD-285.9) Assessment: Improved check cbc today does donate blood not currently on iron  Problem # 4:  HYPERTENSION, ESSENTIAL NOS (ICD-401.9) Assessment: Deteriorated  inc in bp with dizziness episodes and then normalizes change to cozaar for cost  continue to monitor at home f/u 1 mo  lab today Her updated medication list for this problem includes:    Cozaar 100 Mg Tabs (Losartan potassium) .Marland Kitchen... 1 by mouth once daily  Orders: Venipuncture (30865) TLB-Lipid Panel (80061-LIPID) TLB-Renal Function Panel (80069-RENAL) TLB-CBC Platelet - w/Differential (85025-CBCD) TLB-A1C / Hgb A1C (Glycohemoglobin) (83036-A1C) TLB-IBC Pnl (Iron/FE;Transferrin) (83550-IBC) TLB-B12, Serum-Total ONLY (82607-B12)  BP today: 126/70 Prior BP: 130/70 (04/19/2009)  Labs Reviewed: K+: 4.6  (01/01/2009) Creat: : 0.76 (01/01/2009)   Chol: 195 (12/14/2008)   HDL: 43.80 (12/14/2008)   LDL: 125 (12/14/2008)   TG: 133.0 (12/14/2008)  Problem # 5:  HYPERLIPIDEMIA (ICD-272.4) Assessment: Unchanged  on crestor - lab today fair diet disc low sat fat diet  disc at f/u Her updated medication list for this problem includes:    Crestor 5 Mg Tabs (Rosuvastatin calcium) .Marland Kitchen... Take one tablet by mouth every other day  Orders: Venipuncture (78469) TLB-Lipid Panel (80061-LIPID) TLB-Renal Function Panel (80069-RENAL) TLB-CBC Platelet - w/Differential (85025-CBCD) TLB-A1C / Hgb A1C (Glycohemoglobin) (83036-A1C) TLB-IBC Pnl (Iron/FE;Transferrin) (83550-IBC) TLB-B12, Serum-Total ONLY [id number])  Labs Reviewed: SGOT: 19 (12/14/2008)   SGPT: 16 (12/14/2008)   HDL:43.80 (12/14/2008), 41.8 (08/08/2008)  LDL:125 (12/14/2008), DEL (08/08/2008)  Chol:195 (12/14/2008), 201 (08/08/2008)  Trig:133.0 (12/14/2008), 128 (08/08/2008)  Problem # 6:  DIABETES MELLITUS, TYPE II (ICD-250.00) Assessment: Unchanged lab today f/u 1 mo disc healthy diet (low simple sugar/ choose complex carbs/ low sat fat) diet and exercise in detail  Her updated medication list for this problem includes:    Glucophage 500 Mg Tabs (Metformin hcl) .Marland Kitchen... 1 by mouth two times a day    Cozaar 100 Mg Tabs (Losartan potassium) .Marland Kitchen... 1 by mouth once daily  Orders: Venipuncture (32440) TLB-Lipid Panel (80061-LIPID) TLB-Renal Function Panel (80069-RENAL) TLB-CBC Platelet - w/Differential (85025-CBCD) TLB-A1C / Hgb A1C (Glycohemoglobin) (83036-A1C) TLB-IBC Pnl (Iron/FE;Transferrin) (83550-IBC) TLB-B12, Serum-Total ONLY (10272-Z36)  Complete Medication List: 1)  Glucophage 500 Mg Tabs (Metformin hcl) .Marland Kitchen.. 1 by mouth two times a day 2)  Flexeril 10 Mg Tabs (Cyclobenzaprine hcl) .... 1/2 to 1 by mouth up to three times a day as needed for back pain 3)  Onetouch Ultra Test Strp (Glucose blood) .... Check blood sugar twice a day  and as needed  for diabetes 250.0 4)  Flax Seed  .... Once daily 5)  Cozaar 100 Mg Tabs (Losartan potassium) .Marland Kitchen.. 1 by mouth once daily 6)  Crestor 5 Mg Tabs (Rosuvastatin calcium) .... Take one tablet  by mouth every other day  Patient Instructions: 1)  labs today  2)  watch salt in diet and do not miss doses of your blood pressure medicine  3)  no changes in medicines  4)  take your motion sickness medicine when you get dizzy  5)  we will schedule neurology appt at check out  6)  follow up with me in 1 month to review labs  Prescriptions: COZAAR 100 MG TABS (LOSARTAN POTASSIUM) 1 by mouth once daily  #30 x 5   Entered and Authorized by:   Judith Part MD   Signed by:   Judith Part MD on 06/04/2009   Method used:   Print then Give to Patient   RxID:   [id number]   Current Allergies (reviewed today): ! CODEINE ! RELAFEN ! * ZETIA ! LIPITOR ! * BEE STINGS ! AUGMENTIN ! ASPIRIN

## 2010-08-08 NOTE — Letter (Signed)
Summary: Generic Letter  [facility]  [address] [place], [place] [address]   Phone: [phone]  Fax: [phone]    12/22/2008    Margaret Vazquez [address] [place], [place]  [address]    Dear [name],    Your mammogram was normal, please repeat screening in one year.      Sincerely,   Liane Comber

## 2010-08-08 NOTE — Assessment & Plan Note (Signed)
Summary: Vitamin B-12 injection Salley Scarlet   Nurse Visit   Allergies: 1)  ! Codeine 2)  ! Relafen 3)  ! * Zetia 4)  ! Lipitor 5)  ! * Bee Stings 6)  ! Augmentin 7)  ! Aspirin  Medication Administration  Injection # 1:    Medication: Vit B12 1000 mcg    Diagnosis: VITAMIN B12 DEFICIENCY (ICD-266.2)    Route: IM    Site: L deltoid    Exp Date: 10/12    Lot #: [id number]    Mfr: American Regent    Patient tolerated injection without complications    Given by: Lowella Petties CMA (July 31, 2009 10:21 AM)  Orders Added: 1)  Vit B12 1000 mcg [J3420] 2)  Admin of Therapeutic Inj  intramuscular or subcutaneous [96372]   Medication Administration  Injection # 1:    Medication: Vit B12 1000 mcg    Diagnosis: VITAMIN B12 DEFICIENCY (ICD-266.2)    Route: IM    Site: L deltoid    Exp Date: 10/12    Lot #: [id number]    Mfr: American Regent    Patient tolerated injection without complications    Given by: Lowella Petties CMA (July 31, 2009 10:21 AM)  Orders Added: 1)  Vit B12 1000 mcg [J3420] 2)  Admin of Therapeutic Inj  intramuscular or subcutaneous [[address]]

## 2010-08-08 NOTE — Consult Note (Signed)
Summary: [facility]  [facility]   Imported By: Lanelle Bal 03/02/2009 12:52:19  _____________________________________________________________________  External Attachment:    Type:   Image     Comment:   External Document

## 2010-08-08 NOTE — Assessment & Plan Note (Signed)
Summary: 3 MONTH FOLLOW UP/[facility]   Vital Signs:  Patient Profile:   75 Years Old Female Weight:      209 pounds Temp:     97.7 degrees F oral Pulse rate:   60 / minute Pulse rhythm:   regular BP sitting:   130 / 60  (left arm) Cuff size:   large  Vitals Entered By: Providence Crosby (November 03, 2007 9:15 AM)                 Chief Complaint:  FOLLOWUP.  History of Present Illness: has been feeling terrible  not enough energy to walk across the floor  when she sits down and gets up - something sore across bottom- when she gets up and sits down   some pain over L leg- feels heavy R leg gets electrical shocks 3-4 times daily feels like legs will give away when she walks L arm shakes and feels weak   a lot of heartburn - lately is taking an anti acid - ? not sure what it is  has gained 4 lb been eating too much   sugar control was ok chol needs   bp better today    Prior Medications Reviewed Using: Patient Recall  Current Allergies (reviewed today): ! CODEINE ! RELAFEN ! * ZETIA ! LIPITOR ! * BEE STINGS  Past Medical History:    Reviewed history from 10/09/2006 and no changes required:       Skin cancer, hx of- basal cell/ ak's       Diabetes mellitus, type II       GERD       Dysphagia  Past Surgical History:    Reviewed history from 10/09/2006 and no changes required:       Appendectomy       BTL       Breast biopsy       Deca- normal (03/2000)       Heart cath- neg. (1993)       Cardiolite- neg. (11/1999)       Shoulder impingement (10/2002)       Abd. [place]- neg. (07/2003)       Tear duct surgery (2005)       Sinus surgery (01/2005)   Family History:    Reviewed history from 08/04/2007 and no changes required:       brother- lung cancer- smoker       sister had 5 back operations   Social History:    Reviewed history from 08/04/2007 and no changes required:       Never Smoked    Review of Systems  General      Complains of fatigue and  weakness.      Denies chills and fever.  Eyes      Denies blurring.  CV      Denies chest pain or discomfort, palpitations, shortness of breath with exertion, and swelling of feet.  Resp      Denies cough.  GI      Denies abdominal pain, bloody stools, and change in bowel habits.  GU      Denies dysuria.  MS      Complains of joint pain, loss of strength, low back pain, and stiffness.      Denies joint redness, joint swelling, and cramps.  Derm      Denies changes in color of skin and rash.  Neuro      Complains of numbness, tingling, and weakness.  Denies falling down, headaches, inability to speak, memory loss, seizures, and sensation of room spinning.  Psych      mood is ok- frustrated by not feeling well  Endo      Denies excessive thirst and excessive urination.   Physical Exam  General:     overweight but generally well appearing  Head:     normocephalic, atraumatic, and no abnormalities observed.   Eyes:     vision grossly intact, pupils equal, pupils round, and pupils reactive to light.   Neck:     supple with full rom and no masses or thyromegally, no JVD or carotid bruit  Chest Wall:     No deformities, masses, or tenderness noted. Lungs:     Normal respiratory effort, chest expands symmetrically. Lungs are clear to auscultation, no crackles or wheezes. Heart:     Normal rate and regular rhythm. S1 and S2 normal without gallop, murmur, click, rub or other extra sounds. Abdomen:     Bowel sounds positive,abdomen soft and non-tender without masses, organomegaly or hernias noted. Msk:     No deformity or scoliosis noted of thoracic or lumbar spine.   very milk sacral tenderness  nl rom LS, neg LSR  no acute joint changes  nl rom hips/ankles and knees  Pulses:     R and L carotid,radial,femoral,dorsalis pedis and posterior tibial pulses are full and equal bilaterally Extremities:     No clubbing, cyanosis, edema, or deformity noted with  normal full range of motion of all joints.   Neurologic:     cranial nerves II-XII intact, sensation intact to light touch, gait normal, DTRs symmetrical and normal, toes down bilaterally on Babinski, and Romberg negative.  strength symmetric in all ext  Skin:     Intact without suspicious lesions or rashes Cervical Nodes:     No lymphadenopathy noted Inguinal Nodes:     No significant adenopathy Psych:     fatigued appearing- but overall nl affect    Impression & Recommendations:  Problem # 1:  BACK PAIN, LUMBAR (ICD-724.2) Assessment: New sacral pain- unclear etiol- in combo with leg discomfort will check LS x ray and ref to neurol pt advised to update me if symptoms worsen or do not improve  Orders: Diagnostic X-Ray/Fluoroscopy (Diagnostic X-Ray/Flu)   Problem # 2:  LEG PAIN (ICD-729.5) Assessment: New leg pain and ? subjective weakness and heaviness that are limiting mobility and activity will check ls x ray and ref to neurol (pt also wants to disc hand tremor) ? if poss radiculopathy (? or less likely demyelinating dz)  Orders: Neurology Referral (Neuro)   Problem # 3:  HYPERTENSION, ESSENTIAL NOS (ICD-401.9) Assessment: Improved bp is imp on ace- and tol well Her updated medication list for this problem includes:    Zestril 5 Mg Tabs (Lisinopril) .Marland Kitchen... 1 by mouth q am  BP today: 130/60 Prior BP: 140/82 (08/04/2007)  Labs Reviewed: Creat: 0.9 (08/04/2007) Chol: 192 (08/04/2007)   HDL: 37.9 (08/04/2007)   LDL: 131 (08/04/2007)   TG: 114 (08/04/2007)   Problem # 4:  GERD (ICD-530.81) Assessment: Deteriorated acting up with wt gain will try zantac and update- pt will also check and see what she is taking otc disc opt diet  The following medications were removed from the medication list:    Prilosec 20 Mg Cpdr (Omeprazole) .Marland Kitchen... 1 by mouth qd   Problem # 5:  HYPERLIPIDEMIA (ICD-272.4) Assessment: Unchanged pt admits to poor diet- will disc further at  f/u Labs Reviewed: Chol: 192 (08/04/2007)   HDL: 37.9 (08/04/2007)   LDL: 131 (08/04/2007)   TG: 114 (08/04/2007) SGOT: 14 (08/04/2007)   SGPT: 14 (08/04/2007)   Problem # 6:  DIABETES MELLITUS, TYPE II (ICD-250.00) Assessment: Unchanged fairly controlled- pt admits to poor diet- disc further at f/u Her updated medication list for this problem includes:    Glucophage 500 Mg Tabs (Metformin hcl) .Marland Kitchen... 1 by mouth bid    Zestril 5 Mg Tabs (Lisinopril) .Marland Kitchen... 1 by mouth q am  Labs Reviewed: HgBA1c: 6.6 (08/04/2007)   Creat: 0.9 (08/04/2007)      Complete Medication List: 1)  Glucophage 500 Mg Tabs (Metformin hcl) .Marland Kitchen.. 1 by mouth bid 2)  Zestril 5 Mg Tabs (Lisinopril) .Marland Kitchen.. 1 by mouth q am   Patient Instructions: 1)  start ranitidine (zantac)- over the counter 75-150 mg twice daily over the counter - for acid reflux 2)  we will ref you to neurologist for tremor and leg weakness 3)  we will send you for a back x ray at check out  4)  follow up with me in 3 months     ]

## 2010-08-08 NOTE — Progress Notes (Signed)
Summary: Labwork for Dr. Pearlean Brownie  Phone Note From Other Clinic   Caller: Nurse, Alverda Skeans, Dr. Pearlean Brownie Call For: Dr. Milinda Antis Summary of Call: FYI:  This patient is apparently allergic to Plavix and is now being switched to Ticlid.  She will need a base CBC/diff and platelets.  She prefers to come here for that labwork.  Dr. Marlis Edelson office will fax the order but I spoke with Aram Beecham and she says is not permissible.  It is done only with [place] MD's.  Just wanted to make a note of this for future reference. Initial call taken by: Delilah Shan CMA Duncan Dull),  January 01, 2010 12:21 PM  Follow-up for Phone Call        thanks for the update - I think that is the protocol  Follow-up by: Judith Part MD,  January 01, 2010 1:23 PM   New Allergies: ! * PLAVIX New Allergies: ! * PLAVIX

## 2010-08-08 NOTE — Assessment & Plan Note (Signed)
Summary: VIT B12/  [facility]   Nurse Visit   Allergies: 1)  ! Codeine 2)  ! Relafen 3)  ! * Zetia 4)  ! Lipitor 5)  ! * Bee Stings 6)  ! Augmentin 7)  ! Aspirin  Medication Administration  Injection # 1:    Medication: Vit B12 1000 mcg    Diagnosis: VITAMIN B12 DEFICIENCY (ICD-266.2)    Route: IM    Site: L deltoid    Exp Date: 02/2011    Lot #: [id number]    Mfr: American Regent    Patient tolerated injection without complications    Given by: Lowella Petties CMA (June 13, 2009 10:55 AM)  Orders Added: 1)  Vit B12 1000 mcg [J3420] 2)  Admin of Therapeutic Inj  intramuscular or subcutaneous [[id number]]   Medication Administration  Injection # 1:    Medication: Vit B12 1000 mcg    Diagnosis: VITAMIN B12 DEFICIENCY (ICD-266.2)    Route: IM    Site: L deltoid    Exp Date: 02/2011    Lot #: [id number]    Mfr: American Regent    Patient tolerated injection without complications    Given by: Lowella Petties CMA (June 13, 2009 10:55 AM)  Orders Added: 1)  Vit B12 1000 mcg [J3420] 2)  Admin of Therapeutic Inj  intramuscular or subcutaneous [78295]

## 2010-08-08 NOTE — Progress Notes (Signed)
Summary: needs written script for test strips  Phone Note Refill Request Call back at Home Phone [phone] Message from:  Patient  Refills Requested: Medication #1:  ONETOUCH ULTRA TEST  STRP check blood sugar twice a day. Pt needs a written script with dx for her to take to the pharmacy.   Please call when ready.  Initial call taken by: Marty Heck,  Nov 14, 2008 10:14 AM  Follow-up for Phone Call        printed and in my out box for pick up  Follow-up by: Allena Earing MD,  Nov 14, 2008 1:00 PM  Additional Follow-up for Phone Call Additional follow up Details #1::        Advised patient.  ......................................................Marland KitchenRonny Bacon Chavers Nov 14, 2008 3:35 PM Rx left at front desk for patient to pick up. .............................................................Marland KitchenRonny Bacon Chavers Nov 14, 2008 3:36 PM     New/Updated Medications: ONETOUCH ULTRA TEST  STRP (GLUCOSE BLOOD) check blood sugar twice a day and as needed  for diabetes 250.0   Prescriptions: ONETOUCH ULTRA TEST  STRP (GLUCOSE BLOOD) check blood sugar twice a day and as needed  for diabetes 250.0  #100 x 3   Entered and Authorized by:   Allena Earing MD   Signed by:   Allena Earing MD on 11/14/2008   Method used:   Print then Give to Patient   RxID:   [id number]

## 2010-08-08 NOTE — Letter (Signed)
Summary: Consult Scheduled Letter  [facility]  [address] [place], [place] [address]   Phone: [phone]  Fax: [phone]    11/20/2006 MRN: [id number]    Dear Ms. Maisie Fus,      We have scheduled an appointment for you.  At the recommendation of Dr.______________________, we have scheduled you a consult with ____________________ on __________________ at ______.  Their phone number is ___________________.  If this appointment day and time is not convenient for you, please feel free to call the office of the doctor you are being referred to at the number listed above and reschedule the appointment.  If you have any questions, please call and speak with Carlton Adam at [phone].   Thank you,  Lowella Petties [place] [facility]

## 2010-08-08 NOTE — Assessment & Plan Note (Signed)
Summary: B12  CYD   Nurse Visit   Allergies: 1)  ! Codeine 2)  ! Relafen 3)  ! * Zetia 4)  ! Lipitor 5)  ! * Bee Stings 6)  ! Augmentin 7)  ! Aspirin  Medication Administration  Injection # 1:    Medication: Vit B12 1000 mcg    Diagnosis: VITAMIN B12 DEFICIENCY (ICD-266.2)    Route: IM    Site: L deltoid    Exp Date: 11/2010    Lot #: [id number]    Mfr: American Regent    Patient tolerated injection without complications    Given by: Lowella Petties CMA (June 20, 2009 10:56 AM)  Orders Added: 1)  Vit B12 1000 mcg [J3420] 2)  Admin of Therapeutic Inj  intramuscular or subcutaneous [96372]   Medication Administration  Injection # 1:    Medication: Vit B12 1000 mcg    Diagnosis: VITAMIN B12 DEFICIENCY (ICD-266.2)    Route: IM    Site: L deltoid    Exp Date: 11/2010    Lot #: [id number]    Mfr: American Regent    Patient tolerated injection without complications    Given by: Lowella Petties CMA (June 20, 2009 10:56 AM)  Orders Added: 1)  Vit B12 1000 mcg [J3420] 2)  Admin of Therapeutic Inj  intramuscular or subcutaneous [[id number]]

## 2010-08-08 NOTE — Assessment & Plan Note (Signed)
Summary: er follow up [facility] r/s from dr tower/[facility]   Vital Signs:  Patient profile:   75 year old female Height:      69 inches Weight:      209.13 pounds BMI:     30.99 Temp:     98.5 degrees F oral Pulse rate:   88 / minute Pulse rhythm:   regular BP sitting:   130 / 70  (left arm) Cuff size:   regular  Vitals Entered By: Florence ([place]) (April 19, 2009 12:21 PM) CC: ER follow up   History of Present Illness: 75 yo presents for ER follow up.  Was seen at [facility] center on 10/3 for syncope and found to be hyptertensionve, 195/87.  Did take her Benicar that morning.  Felt dizzy before passing out.  Denied any nausea, vomitting, focal neurological symptoms prior to her syncopal episode.  This has happened twice to her in past.  Found to have sinusitis on head CT and treated with Amoxicillin.  No fevers, no illnesses prior to syncopal episode.  PMH:  Had normal stress test 4 months ago  Medical records [facility]:  Head Ct neg for all except sinusitis, CXR neg, Cr 0.84, WBC 5.1, Hgb 13.1 EKG NSR  Current Medications (verified): 1)  Glucophage 500 Mg Tabs (Metformin Hcl) .Marland Kitchen.. 1 By Mouth Two Times A Day 2)  Flexeril 10 Mg Tabs (Cyclobenzaprine Hcl) .... 1/2 To 1 By Mouth Up To Three Times A Day As Needed For Back Pain 3)  Onetouch Ultra Test  Strp (Glucose Blood) .... Check Blood Sugar Twice A Day and As Needed  For Diabetes 250.0 4)  Flax Seed .... Once Daily 5)  Benicar 40 Mg Tabs (Olmesartan Medoxomil) .... Take One Tablet By Mouth Daily 6)  Crestor 5 Mg Tabs (Rosuvastatin Calcium) .... Take One Tablet By Mouth Every Other Day  Allergies: 1)  ! Codeine 2)  ! Relafen 3)  ! * Zetia 4)  ! Lipitor 5)  ! * Bee Stings 6)  ! Augmentin 7)  ! Aspirin  Review of Systems      See HPI General:  Denies fever and malaise. Eyes:  Denies blurring and double vision. CV:  Denies chest pain or discomfort. Resp:  Denies shortness of breath. GI:  Denies nausea  and vomiting. MS:  Denies muscle weakness. Neuro:  Denies difficulty with concentration, disturbances in coordination, sensation of room spinning, tingling, tremors, visual disturbances, and weakness. Psych:  Denies anxiety and depression.  Physical Exam  General:  overweight but generally well appearing VS reviewed- BP 130/70 Head:  normocephalic, atraumatic, and no abnormalities observed.  no temporal tenderness  Eyes:  vision grossly intact, pupils equal, pupils round, and pupils reactive to light.  no conjunctival pallor, injection or icterus  Ears:  R ear normal and L ear normal.   Mouth:  pharynx pink and moist.   Lungs:  Normal respiratory effort, chest expands symmetrically. Lungs are clear to auscultation, no crackles or wheezes. Heart:  Normal rate and regular rhythm. S1 and S2 normal without gallop, murmur, click, rub or other extra sounds. Abdomen:  Bowel sounds positive,abdomen soft and non-tender without masses, organomegaly or hernias noted. no renal bruits  Neurologic:  sensation intact to light touch, gait normal, and DTRs symmetrical and normal.   Psych:  nl affect    Impression & Recommendations:  Problem # 1:  SYNCOPE (ICD-780.2) Assessment Improved associated with HTN.  Has had neg cardiac workup.  all labs and imaging were normal in ED, except findings consistent with sinusitis.  Just finished course of amoxicillin.  No recurrance of symptoms. ? secondary to dehydration/infection?  Advised to follow up with primary doctor in a few months.  Complete Medication List: 1)  Glucophage 500 Mg Tabs (Metformin hcl) .Marland Kitchen.. 1 by mouth two times a day 2)  Flexeril 10 Mg Tabs (Cyclobenzaprine hcl) .... 1/2 to 1 by mouth up to three times a day as needed for back pain 3)  Onetouch Ultra Test Strp (Glucose blood) .... Check blood sugar twice a day and as needed  for diabetes 250.0 4)  Flax Seed  .... Once daily 5)  Benicar 40 Mg Tabs (Olmesartan medoxomil) .... Take one tablet by  mouth daily 6)  Crestor 5 Mg Tabs (Rosuvastatin calcium) .... Take one tablet by mouth every other day  Current Allergies (reviewed today): ! CODEINE ! RELAFEN ! * ZETIA ! LIPITOR ! * BEE STINGS ! AUGMENTIN ! ASPIRIN

## 2010-08-08 NOTE — Procedures (Signed)
Summary: Colonoscopy   Colonoscopy  Procedure date:  12/29/2007  Findings:      Location:  [facility].    Procedures Next Due Date:    Colonoscopy: 01/2013  Patient Name: Margaret Vazquez, Margaret Vazquez MRN:  Procedure Procedures: Colonoscopy CPT: 09811.    with polypectomy. CPT: A3573898.  Personnel: Endoscopist: Rachael Fee, MD.  Referred By: Roxy Manns, MD.  Exam Location: Exam performed in Endoscopy Suite. Outpatient  Patient Consent: Procedure, Alternatives, Risks and Benefits discussed, consent obtained, from patient. Consent was obtained by the RN.  Indications  Average Risk Screening Routine.  History  Current Medications: Patient is not currently taking Coumadin.  Comments: Patient history reviewed and updated, pre-procedure physical performed prior to initiation of sedation? yes Pre-Exam Physical: Performed Dec 29, 2007. Cardio-pulmonary exam, Abdominal exam, Mental status exam WNL.  Exam Exam: Extent of exam reached: Cecum, extent intended: Cecum.  The cecum was identified by appendiceal orifice and IC valve. Patient position: on left side. Time to Cecum: 00:03:43. Time for Withdrawl: 00:12:43. Colon retroflexion performed. Images taken. ASA Classification: II. Tolerance: good.  Monitoring: Pulse and BP monitoring, Oximetry used. Supplemental O2 given.  Colon Prep Prep results: good.  Sedation Meds: Patient assessed and found to be appropriate for moderate (conscious) sedation. Fentanyl 50 mcg. given IV. Versed 7 mg. given IV.  Findings POLYP: Descending Colon, Maximum size: 2 mm. sessile polyp. Procedure:  snare without cautery, removed, retrieved, Polyp sent to pathology. Polyp sent to pathology.  POLYP: Descending Colon, Maximum size: 3 mm. sessile polyp. Procedure:  snare without cautery, removed, retrieved, sent to pathology. sent to pathology.  POLYP: Sigmoid Colon, Maximum size: 2 mm. sessile polyp. Procedure:  snare without cautery,  removed, retrieved, sent to pathology. sent to pathology.  POLYP: Sigmoid Colon, Maximum size: 4 mm. sessile polyp. Procedure:  snare without cautery, removed, retrieved, sent to pathology. sent to pathology.  - NORMAL EXAM: Cecum to Rectum. Comments: OTHERWISE NORMAL EXAMINATION.   Assessment Abnormal examination, see findings above.  Comments: FOUR SMALL COLON POLYPS, NO CANCERS.  IF THE POLYPS ARE PRE-CANCEROUS POLYPS (ADENOMAS), A COLONOSCOPY SHOULD BE REPEATED IN 3-5 YEARS.  OTHERWISE THE PATIENT SHOULD CONTINUE TO FOLLOW CURRENT COLORECTAL CANCER SCREENING GUIDELINES WITH A REPEAT COLONOSCOPY IN 10 YEARS. Events  Unplanned Interventions: No intervention was required.  Unplanned Events: There were no complications.    cc.   Idamae Schuller Tower,MD   REPORT OF SURGICAL PATHOLOGY   Case #: [id number] Patient Name: Margaret Vazquez, Margaret Vazquez. Office Chart Number:  N/A   MRN: [id number] Pathologist: Ferd Hibbs. Colonel Bald, MD DOB/Age  [birth year] (Age: 75)    Gender: F Date Taken:  12/29/2007 Date Received: 12/29/2007   FINAL DIAGNOSIS   ***MICROSCOPIC EXAMINATION AND DIAGNOSIS***   COLON, DESCENDING/SIGMOID, POLYPS (4): - TUBULAR ADENOMA (1). - HYPERPLASTIC POLYPS (3). - HIGH GRADE DYSPLASIA IS NOT IDENTIFIED.    mj Date Reported:  12/30/2007     Ferd Hibbs. Colonel Bald, MD *** Electronically Signed Out By JBK ***   Clinical information R/O adenoma (jes)    specimen(s) obtained Colon, polyp(s), descending / sigmoid (4)   Gross Description Received in formalin are tan, soft tissue fragments that are submitted in toto.  Number:  multiple. Size:  0.4 cm each One block   (ML:jes,12/30/07)    jes/  December 30, 2007 MRN: [id number]    [facility] : [facility] [address] [place], [place]  [address]    Dear Ms. TALLY,   The polyp(s) that were removed during your recent procedure  were proven to be adenomas.  These are pre-cancerous polyps that may have grown into cancers if they had not been removed.   Current colon polyp surveillance guidelines recommend that you have a repeat colonoscopy in 5 years.  We will therefore put your information in our reminder system and will contact you in 5 years to schedule a repeat procedure.  Please call if you have any questions or concerns.       Sincerely,  Rachael Fee MD  This letter has been electronically signed by your physician.   Signed by Rachael Fee MD on 12/30/2007 at 1:56 PM   This report was created from the original endoscopy report, which was reviewed and signed by the above listed endoscopist.   Appended Document: Colonoscopy     Clinical Lists Changes  Observations: Added new observation of PAST SURG HX: Appendectomy BTL Breast biopsy Deca- normal (03/2000) Heart cath- neg. (1993) Cardiolite- neg. (11/1999) Shoulder impingement (10/2002) Abd. [place]- neg. (07/2003) Tear duct surgery (2005) Sinus surgery (01/2005) colonoscopy- adenomatous colon polyps (6/09)  (01/03/2008 10:49)          Past Surgical History:    Appendectomy    BTL    Breast biopsy    Deca- normal (03/2000)    Heart cath- neg. (1993)    Cardiolite- neg. (11/1999)    Shoulder impingement (10/2002)    Abd. [place]- neg. (07/2003)    Tear duct surgery (2005)    Sinus surgery (01/2005)    colonoscopy- adenomatous colon polyps (6/09)

## 2010-08-08 NOTE — Assessment & Plan Note (Signed)
Summary: BREAST PAIN/CLE   Vital Signs:  Patient Profile:   75 Years Old Female Height:     69 inches Weight:      209 pounds BMI:     30.98 Temp:     97.9 degrees F oral Pulse rate:   68 / minute Pulse rhythm:   regular BP sitting:   120 / 68  (left arm) Cuff size:   large  Vitals Entered By: Liane Comber (July 12, 2008 12:53 PM)                 PCP:  Roxy Manns, M.D.  Chief Complaint:  breast pain.  History of Present Illness: has some pain in L chest wall it radiates into armpit and into shoulderblade like a cramp  saw Dr Dallas Schimke- thought pulled muscle had normal breast exam  mam nl in june   is intermittent dull crampy pain  occ starts in middle of night when still happens 4-5 times per day for less than 5 minutes  happening for several months  can reproduce it by pressing on L side of chest - very sore  occ hurts to take a deep breath  stress level is ok  cares for SIL who drives her crazy  was in ER for episode of inc bp -- at [employer]  nl UA and labs and cardiac enzymes and EKG  ? if generally overdoing it  HTN has been ok ever since   cough is still there - dry and hacking - chronic        Current Allergies (reviewed today): ! CODEINE ! RELAFEN ! * ZETIA ! LIPITOR ! * BEE STINGS ! AUGMENTIN  Past Medical History:    Reviewed history from 05/23/2008 and no changes required:       Skin cancer, hx of- basal cell/ ak's       Diabetes mellitus, type II       GERD       Dysphagia       colon polyps       deg disk- spine              GI-- Merri Brunette-- Pearlean Brownie  Past Surgical History:    Reviewed history from 01/03/2008 and no changes required:       Appendectomy       BTL       Breast biopsy       Deca- normal (03/2000)       Heart cath- neg. (1993)       Cardiolite- neg. (11/1999)       Shoulder impingement (10/2002)       Abd. [place]- neg. (07/2003)       Tear duct surgery (2005)       Sinus surgery (01/2005)  colonoscopy- adenomatous colon polyps (6/09)   Family History:    Reviewed history from 11/26/2007 and no changes required:       brother- lung cancer- smoker       sister had 5 back operations        Family History of Colon Polyps:  Social History:    Reviewed history from 05/23/2008 and no changes required:       Never Smoked       retired 3 children, does not drink alcohol, does not drink caffeinated beverages.       cares for SIL with dementia     Physical Exam  General:  Well-developed,well-nourished,in no acute distress; alert,appropriate and cooperative throughout examination Head:     no sinus tenderness normocephalic, atraumatic, and no abnormalities observed.   Eyes:     vision grossly intact, pupils equal, pupils round, and pupils reactive to light.  no conjunctival pallor, injection or icterus  Mouth:     pharynx pink and moist.   Neck:     supple with full rom and no masses or thyromegally, no JVD or carotid bruit  Chest Wall:     tender L ant chest wall - with reproduction of pt's sympotms on palp of that area no crepitice or skin changes noted  Lungs:     Normal respiratory effort, chest expands symmetrically. Lungs are clear to auscultation, no crackles or wheezes. Heart:     Normal rate and regular rhythm. S1 and S2 normal without gallop, murmur, click, rub or other extra sounds. Abdomen:     Bowel sounds positive,abdomen soft and non-tender without masses, organomegaly or hernias noted. Msk:     no cervical or spine tenderness- with full rom no acute joint changes  Extremities:     No clubbing, cyanosis, edema, or deformity noted with normal full range of motion of all joints.   Neurologic:     cranial nerves II-XII intact, sensation intact to light touch, gait normal, and DTRs symmetrical and normal.   Skin:     Intact without suspicious lesions or rashes Cervical Nodes:     No lymphadenopathy noted Axillary Nodes:     No palpable  lymphadenopathy Psych:     normal affect, talkative and pleasant     Impression & Recommendations:  Problem # 1:  CHEST WALL PAIN, ACUTE (ICD-786.52) Assessment: Unchanged ongoing, intermittent with tenderness and no rash or neurol symptoms suspect MS strain/ costochondritis- poss worsened by ongoing cough will plan x ray of chest and ribs stop ace - change bp med (? resolve cough) analgesics and heat as needed f/u 2 weeks adv to update sooner if worse or new symptoms Orders: Radiology Referral (Radiology) Orders: Radiology Referral (Radiology)   Complete Medication List: 1)  Glucophage 500 Mg Tabs (Metformin hcl) .Marland Kitchen.. 1 by mouth two times a day 2)  Benicar 20 Mg Tabs (Olmesartan medoxomil) .Marland Kitchen.. 1 by mouth once daily   Patient Instructions: 1)  we will schedule xrays at check out - at outside radiology site  2)  stop the lisinopril- it may be causing cough 3)  start benicar 20 mg daily for blood pressure - update me if any side effects  4)  follow up in about 2 weeks 5)  update me if any increase in chest pain or any new symptoms    Prescriptions: BENICAR 20 MG TABS (OLMESARTAN MEDOXOMIL) 1 by mouth once daily  #30 x 3   Entered and Authorized by:   Judith Part MD   Signed by:   Judith Part MD on 07/12/2008   Method used:   Print then Give to Patient   RxID:   [id number]  ]

## 2010-08-08 NOTE — Miscellaneous (Signed)
Summary: mammo results   Clinical Lists Changes  Observations: Added new observation of MAMMO DUE: 12/2008 (12/13/2007 9:08) Added new observation of MAMMOGRAM: normal (12/13/2007 9:08)       Preventive Care Screening  Mammogram:    Date:  12/13/2007    Next Due:  12/2008    Results:  normal

## 2010-08-08 NOTE — Progress Notes (Signed)
Summary: TRIAGE: And Pain   Phone Note From Other Clinic   Caller: [facility] @ Dr Milinda Antis [phone] Call For: Dr Christella Hartigan Reason for Call: Schedule Patient Appt Summary of Call: Would like apt sooner than next available fon 10-02-09. Has rectal pain that has been going on for about 1-2 months. Initial call taken by: Leanor Kail [facility],  September 03, 2009 10:08 AM  Follow-up for Phone Call        Pt. given appt. with NP for tomorrow.Shirlee Limerick will contact pt. Follow-up by: Teryl Lucy RN,  September 03, 2009 10:54 AM

## 2010-08-08 NOTE — Letter (Signed)
Summary: Results Follow up Letter   at [facility]  [address] [place], [place] [address]   Phone: [phone]  Fax: [phone]    01/03/2010 MRN: [id number]    [facility] [address] [place], [place]  [address]    Dear Ms. ERBY,  The following are the results of your recent test(s):  Test         Result    Pap Smear:        Normal _____  Not Normal _____ Comments: ______________________________________________________ Cholesterol: LDL(Bad cholesterol):         Your goal is less than:         HDL (Good cholesterol):       Your goal is more than: Comments:  ______________________________________________________ Mammogram:        Normal __X__  Not Normal _____ Comments:  Yearly follow up is recommended.   ___________________________________________________________________ Hemoccult:        Normal _____  Not normal _______ Comments:    _____________________________________________________________________ Other Tests:    We routinely do not discuss normal results over the telephone.  If you desire a copy of the results, or you have any questions about this information we can discuss them at your next office visit.   Sincerely,    Marne A. Milinda Antis, M.D.  MAT:lsf

## 2010-08-08 NOTE — Assessment & Plan Note (Signed)
Summary: RECTAL PAIN/YF  Medications Added LISINOPRIL 5 MG  TABS (LISINOPRIL) once daily        History of Present Illness Visit Type: new patient Primary GI MD: Rob Bunting MD Primary Provider: Roxy Manns, M.D. Chief Complaint: rectal discomfort when sitting  History of Present Illness:   very pleasant 76 year old woman with pelvic discomfort for the past one to 2 months. She says she has an uncomfortable feeling perianally when she sits or stands up. She has not had any discomfort when she actually moves her bowels. She see no blood in her stool. She's had no constipation or diarrhea.  she had a plain film of her spine done recently which suggested some lumbosacral disease, there is also mention of a vague hypo-dense region in her pelvis.  she is Artie then arrange to meet with a spine physician.  she also complains ofMore than usual pyrosis and belching lately. She has been taking over-the-counter Pepcid with some relief of her symptoms. She has no dysphasia. She had no nausea or vomiting.   GI Review of Systems    Reports acid reflux and  belching.         Denies rectal bleeding.     Updated Prior Medication List: GLUCOPHAGE 500 MG TABS (METFORMIN HCL) 1 by mouth bid LISINOPRIL 5 MG  TABS (LISINOPRIL) once daily  Current Allergies (reviewed today): ! CODEINE ! RELAFEN ! * ZETIA ! LIPITOR ! * BEE STINGS  Past Medical History:    Reviewed history from 10/09/2006 and no changes required:       Skin cancer, hx of- basal cell/ ak's       Diabetes mellitus, type II       GERD       Dysphagia  Past Surgical History:    Reviewed history from 10/09/2006 and no changes required:       Appendectomy       BTL       Breast biopsy       Deca- normal (03/2000)       Heart cath- neg. (1993)       Cardiolite- neg. (11/1999)       Shoulder impingement (10/2002)       Abd. [place]- neg. (07/2003)       Tear duct surgery (2005)       Sinus surgery (01/2005)   Family  History:    brother- lung cancer- smoker    sister had 5 back operations     Family History of Colon Polyps:  Social History:    Never Smoked    retired 3 children, does not drink alcohol, does not drink caffeinated beverages.    Review of Systems       Pertinent positive and negative review of systems were noted in the above HPI and GI specific review of systems.  All other review of systems was otherwise negative.    Vital Signs:  Patient Profile:   75 Years Old Female Height:     69 inches Weight:      209.25 pounds BMI:     31.01 BSA:     2.11 Pulse rate:   80 / minute Pulse rhythm:   regular BP sitting:   120 / 68  (left arm)  Vitals Entered By: Milford Cage CMA (Nov 26, 2007 1:37 PM)                  Physical Exam  Constitutional: generally well appearing Psychiatric: alert and oriented times  3 Eyes: extraocular movements intact Mouth: oropharynx moist, no lesions Neck: supple, no lymphadenopathy Cardiovascular: heart regular rate and rythm Lungs: CTA bilaterally Abdomen: soft, non-tender, non-distended, no obvious ascites, no peritoneal signs, normal bowel sounds Extremities: no lower extremity edema bilaterally Skin: no lesions on visible extremities Anorectal examination with female CMA in room normal rectal exam no masses, brown stool in vault, not to check for microscopic blood.    Impression & Recommendations:  Problem # 1:  ABNORMAL FINDINGS GI TRACT (ICD-793.4) there is mention on her plain films that there was pelvic abnormality. This is very unclear and so I will arrange for have a CT scan of her pelvis performed with IV and oral contrast. Her discomfort is not really when she moves her bowels and anal rectal examination today did not cause her to have those symptoms. She is brown stool in her vault. She's had no changes in her bowel habits. I think we should proceed with full colonoscopy as she is never had one for colorectal cancer screening  however I do not think that she has significant colonic pathology causing these symptoms. Seems more likely that this is spinal especially given the patent fact that her symptoms are mostly occurring when she sits or stands. Orders: Colonoscopy (Colon) TLB-BMP (Basic Metabolic Panel-BMET) (80048-METABOL) TLB-CBC Platelet - w/Differential (85025-CBCD)   Problem # 2:  GERD (ICD-530.81) she has rather classic pyrosis Pepcid seems to help, I recommended she try over-the-counter Prilosec and take it 20-30 minutes prior to her breakfast meal as that is the way the pillows designed to work most effectively. She will get a basic set of lab tests today including a CBC a complete metabolic profile.   Patient Instructions: 1)  A copy of this information will be sent to Dr. Milinda Antis. 2)  You will be scheduled to have a colonoscopy. 3)  You will be scheduled for a CT scan. 4)  Start OTC prilosec, 20mg  pills, once daily 20-30 min prior to breakfast meal for next 6-7 weeks.    ]  Appended Document: Orders Update movi prep    Clinical Lists Changes  Medications: Added new medication of MOVIPREP 100 GM  SOLR (PEG-KCL-NACL-NASULF-NA ASC-C) As per prep instructions. - Signed Rx of MOVIPREP 100 GM  SOLR (PEG-KCL-NACL-NASULF-NA ASC-C) As per prep instructions.;  #1 x 0;  Signed;  Entered by: Chales Abrahams CMA;  Authorized by: Rachael Fee MD;  Method used: Electronic    Prescriptions: MOVIPREP 100 GM  SOLR (PEG-KCL-NACL-NASULF-NA ASC-C) As per prep instructions.  #1 x 0   Entered by:   Chales Abrahams CMA   Authorized by:   Rachael Fee MD   Signed by:   Chales Abrahams CMA on 11/26/2007   Method used:   Electronically sent to ...       [address]       [address]       [place], [place]  [address]       Ph: [phone]       Fax: [phone]   RxID:   [id number]     Appended Document: Orders Update ct scan    Clinical Lists  Changes  Orders: Added new Referral order of CT Abdomen/Pelvis with Contrast (CT Abd/Pelvis w/con) - Signed

## 2010-08-08 NOTE — Miscellaneous (Signed)
Summary: ticlid  Medications Added TICLOPIDINE HCL 250 MG TABS (TICLOPIDINE HCL) take one by mouth twice daily       Clinical Lists Changes  Medications: Changed medication from * TICLID 25MG  1 by mouth two times a day to TICLOPIDINE HCL 250 MG TABS (TICLOPIDINE HCL) take one by mouth twice daily     Prior Medications: METFORMIN HCL 1000 MG TABS (METFORMIN HCL) 1 by mouth two times a day ONETOUCH ULTRA TEST  STRP (GLUCOSE BLOOD) check blood sugar twice a day and as needed  for diabetes 250.0 CRESTOR 5 MG TABS (ROSUVASTATIN CALCIUM) Take one tablet by mouth every other day BENICAR 40 MG TABS (OLMESARTAN MEDOXOMIL) 1 by mouth once daily B12 SHOT () one monthly B COMPLEX VITAMIN OTC () one by mouth once daily TICLOPIDINE HCL 250 MG TABS (TICLOPIDINE HCL) take one by mouth twice daily Current Allergies: ! CODEINE ! RELAFEN ! * ZETIA ! LIPITOR ! * BEE STINGS ! AUGMENTIN ! ASPIRIN ! DIOVAN ! * PLAVIX

## 2010-08-08 NOTE — Progress Notes (Signed)
  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 Advised evaluation in ER or urgent care since she didin't feel good and she      was going to have husband take her Initial call taken by: Cindee Salt MD,  April 08, 2009 11:20 AM

## 2010-08-08 NOTE — Progress Notes (Signed)
Summary: Nuclear Pre-Procedure   Phone Note Outgoing Call   Call placed by: Milana Na, EMT-P,  December 14, 2008 4:35 PM Summary of Call: Reviewed information on My[employer] Information Sheet (see scanned document for further details).  Spoke with patients family.       Nuclear Med Background Indications for Stress Test: Evaluation for Ischemia, Post Hospital  Indications Comments: [facility] ED: HTN, NECK PAIN  History: Heart Catheterization, Myocardial Perfusion Study  History Comments: '93 HEART CATH NL 05/01 (-) ischemia  Symptoms: Chest Pain, Chest Pressure, Fatigue  Symptoms Comments: (L) neck pressure and jaw spasm   Nuclear Pre-Procedure Cardiac Risk Factors: Hypertension, IDDM Type 2, Lipids, NIDDM Height (in): 69

## 2010-08-08 NOTE — Progress Notes (Signed)
Summary: had allergic reaction  Phone Note Call from Patient Call back at Home Phone [phone]   Caller: Patient Call For: Allena Earing MD Summary of Call: Pt went to [facility] ER last night with allergic reaction.  She had itchy hives- the worst she has ever had, some throat swelling.  She took 2 benedryls, went to hospital and was given an injection.  She was given a script for cyproheptadine 4 mg's. Initial call taken by: Marty Heck CMA, [place],  July 19, 2010 12:21 PM  Follow-up for Phone Call        thanks for the update - please send for ER records when ready Follow-up by: Allena Earing MD,  July 19, 2010 3:52 PM  Additional Follow-up for Phone Call Additional follow up Details #1::        faxed request to [facility] for  records to be faxed to Rena's fax machine. DeShannon Smith [place] Deborra Medina)  July 22, 2010 10:47 AM   Resent request since records were not received. [place] Deborra Medina)  July 26, 2010 10:08 AM     Additional Follow-up for Phone Call Additional follow up Details #2::    faxed record release for ER visit to [place]. Requested info faxed to [phone].Ozzie Hoyle LPN  January 23, [id number] 9:43 AM   Spoke with Vaughan Basta at [facility] med records and she said was sent to [phone] this AM. Vaughan Basta is resending the ER note because we did not receive the info earlier. Vaughan Basta said was just an ER record no testing was done.Ozzie Hoyle LPN  January 23, [id number] 2:52 PM   Spoke with Vaughan Basta and refaxed Arn Medal LPN  January 24, [id number] 8:22 AM   Received ER record and it is on your shelf in the in box.Ozzie Hoyle LPN  January 24, [id number] 10:42 AM   Additional Follow-up for Phone Call Additional follow up Details #3:: Details for Additional Follow-up Action Taken: thanks- I reviewed ER notes- not much in them at all  does she have any idea what she reacted to -- med or food or product/animal or plant?  are symptoms gone now? please let me know  Additional Follow-up by: Allena Earing MD,  July 30, 2010 2:40 PM  Patient notified as instructed by telephone. Pt said she is not sure what brought on the reaction. Pt said she was standing in her kitchen beginning to prepare dinner and all of a sudden broke out in a red rash and then had swelling of her throat. Pt said she took 2 Benadryl. No new foods the only thing not in her routine was she had gone to the funeral home earlier for a friends visitation.Please advise.   perhaps was flowers in the funeral home? -- may never know  have her schedule f/u with me in next few wk for a re check and I will talk to her about epi pen  if this happens again in meantime-- go to er  Left message for patient to call back. Ozzie Hoyle LPN  January 24, [id number] 5:05 PM  Patient's husband notified as instructed by telephone. Pt will call back for appt to see Dr Glori Bickers.Ozzie Hoyle LPN  January 25, [id number] 11:36 AM

## 2010-08-08 NOTE — Assessment & Plan Note (Signed)
Summary: B12 INJECTION/ Chelsa Stout   Nurse Visit   Allergies: 1)  ! Codeine 2)  ! Relafen 3)  ! * Zetia 4)  ! Lipitor 5)  ! * Bee Stings 6)  ! Augmentin 7)  ! Aspirin 8)  ! Diovan 9)  ! * Plavix  Medication Administration  Injection # 1:    Medication: Vit B12 1000 mcg    Diagnosis: VITAMIN B12 DEFICIENCY (ICD-266.2)    Route: IM    Site: L deltoid    Exp Date: 04/06/2012    Lot #: [id number]    Mfr: [employer]    Patient tolerated injection without complications    Given by: Lewanda Rife LPN (July 19, 2010 2:49 PM)  Orders Added: 1)  Vit B12 1000 mcg [J3420] 2)  Admin of Therapeutic Inj  intramuscular or subcutaneous [[id number]]

## 2010-08-08 NOTE — Progress Notes (Signed)
Summary: FYI regarding phone call  Phone Note Call from Patient   Caller: Spouse Call For: Judith Part MD Summary of Call: A call was sent to me by the front, pt's husband has made appt for pt to see you this afternoon but he says that the pt states that her throat feels like it is swelling up and she cant breathe very well, he says he can hardly understand  what she is saying.  I advised him that it her throat is closing up and she cant breathe that she needs to go to ER.  He then says that pt is not that bad, she just wants to be seen here.  He said that he will take her to ER if she gets worse before appt here at 2:30. Initial call taken by: Lowella Petties,  May 23, 2008 10:11 AM  Follow-up for Phone Call        thanks for the update- I agree with above Follow-up by: Judith Part MD,  May 23, 2008 1:05 PM

## 2010-08-08 NOTE — Progress Notes (Signed)
Summary: [facility] calling to verify dx's   Phone Note Other Incoming Call back at [phone]   Call placed by: [facility]- sara Summary of Call: [facility] calling to verify Dx of DM Initial call taken by: Daralene Milch,  August 09, 2007 1:03 PM  Follow-up for Phone Call        pt has dx of DM type 2 Follow-up by: Allena Earing MD,  August 09, 2007 1:09 PM  Additional Follow-up for Phone Call Additional follow up Details #1::        [place] called back ..................................................................Marland KitchenDaralene Milch  August 09, 2007 2:50 PM

## 2010-08-08 NOTE — Assessment & Plan Note (Signed)
Summary: ROA FOR 6 MONTH FOLLOW-UP/JRR   Vital Signs:  Patient profile:   75 year old female Height:      69 inches Weight:      205 pounds BMI:     30.38 Temp:     98 degrees F oral Pulse rate:   72 / minute Pulse rhythm:   regular BP sitting:   138 / 76  (left arm) Cuff size:   large  Vitals Entered By: Lewanda Rife LPN (June 03, 2010 8:38 AM)  Serial Vital Signs/Assessments:  Time      Position  BP       Pulse  Resp  Temp     By                     130/78                         Judith Part MD  CC: six month f/u   History of Present Illness: here for f/u of HTN and DM and lipids   lipids on crestor are still fairly controlled Last Lipid ProfileCholesterol: 162 (05/27/2010 8:47:15 AM)HDL:  42.20 (05/27/2010 8:47:15 AM)LDL:  89 (05/27/2010 8:47:15 AM)Triglycerides:  Last Liver profileSGOT:  13 (05/27/2010 8:47:15 AM)SPGT:  14 (05/27/2010 8:47:15 AM)T. Bili:  Alk Phos:     AIC is stable at 6.6 opthy up to date flu shot ---does not get them   B12 is 243 -- is supposed to get monthly shots  has missed B12 shots -- got one today -- and also take Bcomplex vit   HTN - first bp 138/76  wt is 205 with bmi of 30  is getting some exercise- not enough   still has an odor to urine -- no other symptoms ? infection prev had e coli - was tx with abx       Allergies: 1)  ! Codeine 2)  ! Relafen 3)  ! * Zetia 4)  ! Lipitor 5)  ! * Bee Stings 6)  ! Augmentin 7)  ! Aspirin 8)  ! Diovan 9)  ! * Plavix  Past History:  Past Surgical History: Last updated: 01/03/2008 Appendectomy BTL Breast biopsy Deca- normal (03/2000) Heart cath- neg. (1993) Cardiolite- neg. (11/1999) Shoulder impingement (10/2002) Abd. [place]- neg. (07/2003) Tear duct surgery (2005) Sinus surgery (01/2005) colonoscopy- adenomatous colon polyps (6/09)  Family History: Last updated: 11/26/2007 brother- lung cancer- smoker sister had 5 back operations  Family History of Colon  Polyps:  Social History: Last updated: 05/23/2008 Never Smoked retired 3 children, does not drink alcohol, does not drink caffeinated beverages. cares for SIL with dementia  Risk Factors: Smoking Status: never (08/04/2007)  Past Medical History: 1.  Aspirin allergy (hives) 2.  C-spine stenosis with neck pain.  3.  HTN 4.  DIabetes type II 5.  LHC 1993: normal coronaries.  Stress cardiolite 2001 normal.  6.  Hyperlipidemia.  Myalgias with lipitor and Zetia.  7.  Skin cancer, hx of- basal cell/ ak's 8.  GERD 9. colon polyps (2009)  GI-- Christella Hartigan neurol-- [facility] gyn- Dr Luella Cook  Review of Systems General:  Denies fatigue, loss of appetite, and malaise. Eyes:  Denies blurring and eye irritation. CV:  Denies chest pain or discomfort, lightheadness, palpitations, and shortness of breath with exertion. Resp:  Denies cough, shortness of breath, and wheezing. GI:  Denies abdominal pain, change in bowel habits, and indigestion. GU:  Denies dysuria, hematuria, and urinary  frequency. MS:  Denies muscle weakness and stiffness. Derm:  Denies itching, lesion(s), poor wound healing, and rash. Neuro:  Denies numbness and tingling. Psych:  Denies anxiety and depression. Endo:  Denies cold intolerance, excessive thirst, excessive urination, and heat intolerance. Heme:  Denies abnormal bruising and bleeding.  Physical Exam  General:  overweight but generally well appearing  Head:  normocephalic, atraumatic, and no abnormalities observed.   Eyes:  vision grossly intact, pupils equal, pupils round, and pupils reactive to light.  slt conj injection without d/c  Mouth:  pharynx pink and moist.   Neck:  supple with full rom and no masses or thyromegally, no JVD or carotid bruit  Lungs:  Normal respiratory effort, chest expands symmetrically. Lungs are clear to auscultation, no crackles or wheezes. Heart:  Normal rate and regular rhythm. S1 and S2 normal without gallop, murmur, click, rub or  other extra sounds. Abdomen:  no suprapubic tenderness or fullness felt  Msk:  no CVA tenderness  Pulses:  R and L carotid,radial,femoral,dorsalis pedis and posterior tibial pulses are full and equal bilaterally Extremities:  No clubbing, cyanosis, edema, or deformity noted with normal full range of motion of all joints.   Neurologic:  sensation intact to light touch, gait normal, and DTRs symmetrical and normal.   Skin:  Intact without suspicious lesions or rashes Cervical Nodes:  No lymphadenopathy noted Inguinal Nodes:  No significant adenopathy Psych:  normal affect, talkative and pleasant   Diabetes Management Exam:    Foot Exam (with socks and/or shoes not present):       Sensory-Pinprick/Light touch:          Left medial foot (L-4): normal          Left dorsal foot (L-5): normal          Left lateral foot (S-1): normal          Right medial foot (L-4): normal          Right dorsal foot (L-5): normal          Right lateral foot (S-1): normal       Sensory-Monofilament:          Left foot: normal          Right foot: normal       Inspection:          Left foot: normal          Right foot: normal       Nails:          Left foot: normal          Right foot: normal   Impression & Recommendations:  Problem # 1:  UTI (ICD-599.0) Assessment Unchanged pt was tx in sept with macrobid (e coli)- but still has the odor to it) will re cx and update  adv to call if other symptoms develop The following medications were removed from the medication list:    Macrobid 100 Mg Caps (Nitrofurantoin monohyd macro) .Marland Kitchen... 1 by mouth two times a day for 7 days  Orders: T-Culture, Urine ([id number]) Specimen Handling ([id number])  Problem # 2:  VITAMIN B12 DEFICIENCY (ICD-266.2) Assessment: Deteriorated  B12 shot today level almost low -- disc imp of continuing monthly shots and oral supplement-- with good compliance  Orders: Vit B12 1000 mcg (J3420) Admin of Therapeutic Inj   intramuscular or subcutaneous (51884)  Problem # 3:  HYPERLIPIDEMIA (ICD-272.4) Assessment: Unchanged  this is fairly stable tolerating low dose crestor rev labs with  pt  med refilled rev low sat fat diet  Her updated medication list for this problem includes:    Crestor 5 Mg Tabs (Rosuvastatin calcium) .Marland Kitchen... Take one tablet by mouth every other day  Labs Reviewed: SGOT: 13 (05/27/2010)   SGPT: 14 (05/27/2010)   HDL:42.20 (05/27/2010), 42.20 (11/27/2009)  LDL:89 (05/27/2010), 79 (11/27/2009)  Chol:162 (05/27/2010), 150 (11/27/2009)  Trig:155.0 (05/27/2010), 145.0 (11/27/2009)  Orders: Prescription Created Electronically [phone])  Problem # 4:  HYPERTENSION, ESSENTIAL NOS (ICD-401.9)  better on 2nd check today  disc inc exercise f/u 6 mo  Her updated medication list for this problem includes:    Benicar 40 Mg Tabs (Olmesartan medoxomil) .Marland Kitchen... 1 by mouth once daily  BP today: 138/76-- re check 130 /78 Prior BP: 124/70 (04/02/2010)  Labs Reviewed: K+: 4.3 (05/27/2010) Creat: : 1.0 (05/27/2010)   Chol: 162 (05/27/2010)   HDL: 42.20 (05/27/2010)   LDL: 89 (05/27/2010)   TG: 155.0 (05/27/2010)  Orders: Prescription Created Electronically [phone])  Problem # 5:  DIABETES MELLITUS, TYPE II (ICD-250.00) Assessment: Unchanged  rev labs with pt - overall stable with AIC 6.6 opthy up to date flu shot today f/u 6 mo  disc healthy diet (low simple sugar/ choose complex carbs/ low sat fat) diet and exercise in detail  Her updated medication list for this problem includes:    Metformin Hcl 1000 Mg Tabs (Metformin hcl) .Marland Kitchen... 1 by mouth two times a day    Benicar 40 Mg Tabs (Olmesartan medoxomil) .Marland Kitchen... 1 by mouth once daily  Labs Reviewed: Creat: 1.0 (05/27/2010)     Last Eye Exam: normal (09/13/2009) Reviewed HgBA1c results: 6.6 (05/27/2010)  6.6 (11/27/2009)  Orders: Prescription Created Electronically [phone])  Complete Medication List: 1)  Metformin Hcl 1000 Mg Tabs  (Metformin hcl) .Marland Kitchen.. 1 by mouth two times a day 2)  Onetouch Ultra Test Strp (Glucose blood) .... Check blood sugar twice a day and as needed  for diabetes 250.0 3)  Crestor 5 Mg Tabs (Rosuvastatin calcium) .... Take one tablet by mouth every other day 4)  Benicar 40 Mg Tabs (Olmesartan medoxomil) .Marland Kitchen.. 1 by mouth once daily 5)  B12 Shot  .... One monthly 6)  B Complex Vitamin Otc  .... One by mouth once daily 7)  Ticlid 25mg   .... 1 by mouth two times a day  Other Orders: Flu Vaccine 38yrs + MEDICARE PATIENTS ([id number]) Administration Flu vaccine - MCR (X3244)  Patient Instructions: 1)  It is important that you exercise reguarly at least 20 minutes 5 times a week. If you develop chest pain, have severe difficulty breathing, or feel very tired, stop exercising immediately and seek medical attention.  2)  keep working on healthy diet and exercise  3)  flu shot today  4)  no change in medicine  5)  schedule fasting labs and then follow up in 6 months  6)  lipid/ast/alt/AIC/ renal / cbc with diff/ tsh for 40.1, 272 and 250.0 Prescriptions: BENICAR 40 MG TABS (OLMESARTAN MEDOXOMIL) 1 by mouth once daily  #90 x 3   Entered and Authorized by:   Judith Part MD   Signed by:   Judith Part MD on 06/03/2010   Method used:   Electronically to        [facility] [address] (retail)       [address]       [place], [place]  [address]       Ph: [phone]  Fax: [phone]   RxID:   [id number] CRESTOR 5 MG TABS (ROSUVASTATIN CALCIUM) Take one tablet by mouth every other day  #3 months x 3   Entered and Authorized by:   Judith Part MD   Signed by:   Judith Part MD on 06/03/2010   Method used:   Electronically to        [facility] [address] (retail)       [address]       [place], [place]  [address]       Ph: [phone]       Fax: [phone]   RxID:   [id number] METFORMIN HCL 1000 MG TABS (METFORMIN HCL) 1 by mouth two times a day  #180 x  3   Entered and Authorized by:   Judith Part MD   Signed by:   Judith Part MD on 06/03/2010   Method used:   Electronically to        [facility] [address] (retail)       [address]       [place], [place]  [address]       Ph: [phone]       Fax: [phone]   RxID:   [id number]    Medication Administration  Injection # 1:    Medication: Vit B12 1000 mcg    Diagnosis: VITAMIN B12 DEFICIENCY (ICD-266.2)    Route: IM    Site: R deltoid    Exp Date: 11/05/2011    Lot #: [id number]    Mfr: [employer]    Patient tolerated injection without complications    Given by: Lewanda Rife LPN (June 03, 2010 9:21 AM)  Orders Added: 1)  T-Culture, Urine [[id number]] 2)  Vit B12 1000 mcg [J3420] 3)  Admin of Therapeutic Inj  intramuscular or subcutaneous [[id number]] 4)  Flu Vaccine 58yrs + MEDICARE PATIENTS [Q2039] 5)  Administration Flu vaccine - MCR [G0008] 6)  Specimen Handling [99000] 7)  Prescription Created Electronically [[id number]] 8)  Est. Patient Level IV [46270]    Current Allergies (reviewed today): ! CODEINE ! RELAFEN ! * ZETIA ! LIPITOR ! * BEE STINGS ! AUGMENTIN ! ASPIRIN ! DIOVAN ! * PLAVIX  Flu Vaccine Consent Questions     Do you have a history of severe allergic reactions to this vaccine? no    Any prior history of allergic reactions to egg and/or gelatin? no    Do you have a sensitivity to the preservative Thimersol? no    Do you have a past history of Guillan-Barre Syndrome? no    Do you currently have an acute febrile illness? no    Have you ever had a severe reaction to latex? no    Vaccine information given and explained to patient? yes    Are you currently pregnant? no    Lot Number:[id number]   Exp Date:01/04/2011   Site Given  Left Deltoid [id number] Lewanda Rife LPN  June 03, 2010 9:22 AM   Preventive Care Screening  Pap Smear:    Date:  05/08/2010    Results:  normal

## 2010-08-08 NOTE — Assessment & Plan Note (Signed)
Summary: Rectal pain/Dr.Jacobs pt    History of Present Illness Visit Type: Initial Consult Primary GI MD: Rob Bunting MD Primary Provider: Roxy Manns, M.D. Requesting Provider: Roxy Manns, MD Chief Complaint: Pt states for the last month she has rectal pain only when she sits down. Pt denies and rectal bleeding or hx of hemorrhoids. Pt has had a change in bowels for the last 3-4 weeks with looser stools. Pt denies any rectal pain with BM.  History of Present Illness:   Back with recurrent rectal pain for which she saw Dr. Christella Hartigan in 2009. Hurts to sit. Feels better to lift one buttock off chair when sitting. No pain with defecation. No change in bowel habits, no rectal bleeding.     GI Review of Systems      Denies abdominal pain, acid reflux, belching, bloating, chest pain, dysphagia with liquids, dysphagia with solids, heartburn, loss of appetite, nausea, vomiting, vomiting blood, weight loss, and  weight gain.      Reports rectal pain.     Denies anal fissure, black tarry stools, change in bowel habit, constipation, diarrhea, diverticulosis, fecal incontinence, heme positive stool, hemorrhoids, irritable bowel syndrome, jaundice, light color stool, liver problems, and  rectal bleeding.    Current Medications (verified): 1)  Metformin Hcl 1000 Mg Tabs (Metformin Hcl) .Marland Kitchen.. 1 By Mouth Two Times A Day 2)  Flexeril 10 Mg Tabs (Cyclobenzaprine Hcl) .... 1/2 To 1 By Mouth Up To Three Times A Day As Needed For Back Pain 3)  Onetouch Ultra Test  Strp (Glucose Blood) .... Check Blood Sugar Twice A Day and As Needed  For Diabetes 250.0 4)  Flax Seed .... Once Daily 5)  Crestor 5 Mg Tabs (Rosuvastatin Calcium) .... Take One Tablet By Mouth Every Other Day 6)  Benicar 40 Mg Tabs (Olmesartan Medoxomil) .Marland Kitchen.. 1 By Mouth Once Daily 7)  B12 Shot .... One Monthly 8)  B Complex Vitamin Otc .... One By Mouth Once Daily  Allergies (verified): 1)  ! Codeine 2)  ! Relafen 3)  ! * Zetia 4)  !  Lipitor 5)  ! * Bee Stings 6)  ! Augmentin 7)  ! Aspirin  Past History:  Past Medical History: 1.  Aspirin allergy (hives) 2.  C-spine stenosis with neck pain.  3.  HTN 4.  DIabetes type II 5.  LHC 1993: normal coronaries.  Stress cardiolite 2001 normal.  6.  Hyperlipidemia.  Myalgias with lipitor and Zetia.  7.  Skin cancer, hx of- basal cell/ ak's 8.  GERD 9. colon polyps (2009)  GI-- Merri Brunette-- Pearlean Brownie  Past Surgical History: Reviewed history from 01/03/2008 and no changes required. Appendectomy BTL Breast biopsy Deca- normal (03/2000) Heart cath- neg. (1993) Cardiolite- neg. (11/1999) Shoulder impingement (10/2002) Abd. [place]- neg. (07/2003) Tear duct surgery (2005) Sinus surgery (01/2005) colonoscopy- adenomatous colon polyps (6/09)  Family History: Reviewed history from 11/26/2007 and no changes required. brother- lung cancer- smoker sister had 5 back operations  Family History of Colon Polyps:  Social History: Reviewed history from 05/23/2008 and no changes required. Never Smoked retired 3 children, does not drink alcohol, does not drink caffeinated beverages. cares for SIL with dementia  Review of Systems  The patient denies allergy/sinus, anemia, anxiety-new, arthritis/joint pain, back pain, blood in urine, breast changes/lumps, change in vision, confusion, cough, coughing up blood, depression-new, fainting, fatigue, fever, headaches-new, hearing problems, heart murmur, heart rhythm changes, itching, menstrual pain, muscle pains/cramps, night sweats, nosebleeds, pregnancy symptoms, shortness of breath, skin rash,  sleeping problems, sore throat, swelling of feet/legs, swollen lymph glands, thirst - excessive , urination - excessive , urination changes/pain, urine leakage, vision changes, and voice change.    Vital Signs:  Patient profile:   75 year old female Height:      69 inches Weight:      207 pounds BMI:     30.68 Pulse rate:   70 / minute Pulse  rhythm:   regular BP sitting:   138 / 72  (right arm) Cuff size:   large  Vitals Entered By: Christie Nottingham CMA Duncan Dull) (September 04, 2009 9:43 AM)  Physical Exam  General:  Well developed, well nourished, no acute distress. Head:  Normocephalic and atraumatic. Eyes:  Conjunctiva pink, no icterus.  Neck:  no obvious masses  Lungs:  Clear throughout to auscultation. Heart:  Regular rate and rhythm; no murmurs, rubs,  or bruits. Abdomen:  Abdomen soft, nontender, nondistended. No obvious masses or hepatomegaly.Normal bowel sounds.  Rectal:  No external or internal masses appreciated. Internally there is mild tenderness at 7 o'clock. Msk:  Symmetrical with no gross deformities. Normal posture. Neurologic:  Alert and  oriented x4;  grossly normal neurologically. Psych:  Alert and cooperative. Normal mood and affect.   Impression & Recommendations:  Problem # 1:  COCCYGEAL PAIN (ICD-724.79) Assessment Deteriorated Same pain as in 2009. No pain with defecation. No bowel habit changes. She only hurts when sitting. Cannot reproduce pain by palpating sacrum / coccyx but I believe her pain is originating from there. A CTscan of the pelvis in 2009 revealed degenerative disease at L5-S1 level where there was a far left disc protrusion with end plate spurring causing narrowing of the left lateral recess. I will defer any further evaluation and treatment to PCP. I did recommend patient try a donut seat.   Problem # 2:  PERSONAL HX COLONIC POLYPS (ICD-V12.72) Assessment: Comment Only She is up to date on surveillance examination.

## 2010-08-08 NOTE — Assessment & Plan Note (Signed)
Summary: PRESSURE IN LEFT SIDE OF NECK   Vital Signs:  Patient profile:   75 year old female Height:      69 inches Weight:      210 pounds BMI:     31.12 Temp:     98.2 degrees F oral Pulse rate:   80 / minute Pulse rhythm:   regular BP sitting:   140 / 70  (left arm) Cuff size:   regular  Vitals Entered By: Liane Comber (December 14, 2008 8:54 AM)  History of Present Illness:   this year- had injections with Dr Ethelene Hal - C5-C7 with spinal stenosis (after seeing Dr Jillyn Hidden) did some PT- made her wors  did one injection - helped for 1 week  ran her sugars really high -- almost 400  now has pain in the same area -- and pressure in L side of her neck  rad into her shoulder and chest  no numbness or tingly - but feels generally shaky neck pain - makes her feel almost like her jaw would spasm  hurts to flex or ext-- has to keep her head perfectly straight   also had some cramps in her arms and leg- brief and better now    sugars were running great before shot 115-130 in the ams   had another episode of HTN- when she was at fam house  went to [facility] ER-- with neck pain  thought inc bp was from neck pain-- gave her pain shots and this helped did really scare her  bp was 200/ over 100--- once her pain got better it came down  more tightness in ams when she walks no sob or nausea very sore   opthy 2 weeks ago -- normal , Dr Benedict Needy  she gave blood sat- ? iron was low    Allergies: 1)  ! Codeine 2)  ! Relafen 3)  ! * Zetia 4)  ! Lipitor 5)  ! * Bee Stings 6)  ! Augmentin  Past History:  Past Medical History: Last updated: 05/23/2008 Skin cancer, hx of- basal cell/ ak's Diabetes mellitus, type II GERD Dysphagia colon polyps deg disk- spine  GI-- Merri Brunette-- Pearlean Brownie  Past Surgical History: Last updated: 01/03/2008 Appendectomy BTL Breast biopsy Deca- normal (03/2000) Heart cath- neg. (1993) Cardiolite- neg. (11/1999) Shoulder impingement (10/2002) Abd.  [place]- neg. (07/2003) Tear duct surgery (2005) Sinus surgery (01/2005) colonoscopy- adenomatous colon polyps (6/09)  Family History: Last updated: 11/26/2007 brother- lung cancer- smoker sister had 5 back operations  Family History of Colon Polyps:  Social History: Last updated: 05/23/2008 Never Smoked retired 3 children, does not drink alcohol, does not drink caffeinated beverages. cares for SIL with dementia  Review of Systems General:  Complains of fatigue; denies fever, loss of appetite, malaise, and sweats. Eyes:  Denies blurring and eye pain. ENT:  Denies sinus pressure and sore throat. CV:  Complains of chest pain or discomfort; denies palpitations. Resp:  Denies cough, pleuritic, sputum productive, and wheezing. GI:  Denies abdominal pain, bloody stools, change in bowel habits, and indigestion. GU:  Denies dysuria. MS:  Complains of joint pain and stiffness; denies muscle aches and muscle weakness. Derm:  Denies poor wound healing and rash. Neuro:  Denies numbness, tingling, and weakness. Psych:  mood is overall fairly good . Endo:  Denies excessive thirst and excessive urination. Heme:  Denies abnormal bruising and bleeding.  Physical Exam  General:  overweight but generally well appearing  Head:  normocephalic, atraumatic, and no  abnormalities observed.  no sinus or TA tenderness  Eyes:  vision grossly intact, pupils equal, pupils round, and pupils reactive to light.  no conjunctival pallor, injection or icterus  Ears:  R ear normal and L ear normal.   Mouth:  pharynx pink and moist.   Neck:  supple increased neck pain on ext and also R rotation some soreness in trapezius mild lower CS bony tenderness no JVD/ Bruits or thyromegally Chest Wall:  No deformities, masses, or tenderness noted. Lungs:  Normal respiratory effort, chest expands symmetrically. Lungs are clear to auscultation, no crackles or wheezes. Heart:  Normal rate and regular rhythm. S1 and S2  normal without gallop, murmur, click, rub or other extra sounds. Abdomen:  Bowel sounds positive,abdomen soft and non-tender without masses, organomegaly or hernias noted. no renal bruits  Msk:  No deformity or scoliosis noted of thoracic or lumbar spine.   no TS tenderness  Extremities:  No clubbing, cyanosis, edema, or deformity noted with normal full range of motion of all joints.   Neurologic:  strength normal in all extremities, sensation intact to light touch, gait normal, and DTRs symmetrical and normal.  (nl strength and sensation in UEs bilat) Skin:  Intact without suspicious lesions or rashes nl color and turgor  Cervical Nodes:  No lymphadenopathy noted Psych:  normal affect, talkative and pleasant    Impression & Recommendations:  Problem # 1:  NECK PAIN (ICD-723.1) Assessment Deteriorated with spinal stenosis - and recent injection (that helped but raised sugar level) will f/u Dr Ethelene Hal for another inj- will inc her metformin during course of that  Her updated medication list for this problem includes:    Flexeril 10 Mg Tabs (Cyclobenzaprine hcl) .Marland Kitchen... 1/2 to 1 by mouth up to three times a day as needed for back pain  Problem # 2:  CHEST PAIN (ICD-786.50) Assessment: New  worse in ams - is exertional and rel to her neck pain also several episodes of sharply inc bp-- assoc with pain per pt  hx of nl cath in 93 and nl cardiolyte in 01 ref to cardiology  Orders: Cardiology Referral (Cardiology) EKG w/ Interpretation (93000)  Problem # 3:  HYPERTENSION, ESSENTIAL NOS (ICD-401.9) Assessment: Unchanged  did not take med today- up slightly  per pt several episodes of acute inc bp - with stress/pain  Her updated medication list for this problem includes:    Benicar 40 Mg Tabs (Olmesartan medoxomil) .Marland Kitchen... Take one tablet by mouth daily  Orders: Venipuncture (60454) TLB-Lipid Panel (80061-LIPID) TLB-Renal Function Panel (80069-RENAL) TLB-CBC Platelet - w/Differential  (85025-CBCD)  Problem # 4:  DIABETES MELLITUS, TYPE II (ICD-250.00) Assessment: Deteriorated  sugar has been very well controlled - until neck injection  is back to nl now  opthy is utd foot care is good  disc plan if she has to have another cervical injection- to inc metformin/ poss add another agent short term  disc healthy diet (low simple sugar/ choose complex carbs/ low sat fat) diet and exercise in detail     Her updated medication list for this problem includes:    Glucophage 500 Mg Tabs (Metformin hcl) .Marland Kitchen... 1 by mouth two times a day    Benicar 40 Mg Tabs (Olmesartan medoxomil) .Marland Kitchen... Take one tablet by mouth daily  Orders: Venipuncture (09811) TLB-Lipid Panel (80061-LIPID) TLB-Renal Function Panel (80069-RENAL) TLB-A1C / Hgb A1C (Glycohemoglobin) (83036-A1C) TLB-Microalbumin/Creat Ratio, Urine (82043-MALB)  Labs Reviewed: Creat: 0.7 (08/08/2008)    Reviewed HgBA1c results: 6.8 (08/08/2008)  6.5 (  12/27/2007)  Problem # 5:  Preventive Health Care (ICD-V70.0) Assessment: Comment Only  need to update health mt list at next f/u- needs pneumovax/ likely td  Complete Medication List: 1)  Glucophage 500 Mg Tabs (Metformin hcl) .Marland Kitchen.. 1 by mouth two times a day 2)  Flexeril 10 Mg Tabs (Cyclobenzaprine hcl) .... 1/2 to 1 by mouth up to three times a day as needed for back pain 3)  Onetouch Ultra Test Strp (Glucose blood) .... Check blood sugar twice a day and as needed  for diabetes 250.0 4)  Flax Seed  .... Once daily 5)  Benicar 40 Mg Tabs (Olmesartan medoxomil) .... Take one tablet by mouth daily 6)  Crestor 5 Mg Tabs (Rosuvastatin calcium) .... Take one tablet by mouth every other day  Other Orders: TLB-ALT (SGPT) (84460-ALT) TLB-AST (SGOT) (84450-SGOT)  Patient Instructions: 1)  call your insurance to see if cozaar is cheaper than benicar- and let me know -- then I will call it in  2)  labs today  3)  we will refer you to cardiology at check out  4)  follow up  with Dr Ethelene Hal-- let me know when your shot is- because I will increase diabetes medicine for that week to cover increase in blood sugars 5)  let me know in meantime if symptoms worsen  Prior Medications (reviewed today): GLUCOPHAGE 500 MG TABS (METFORMIN HCL) 1 by mouth two times a day BENICAR 20 MG TABS (OLMESARTAN MEDOXOMIL) 1 by mouth once daily FLEXERIL 10 MG TABS (CYCLOBENZAPRINE HCL) 1/2 to 1 by mouth up to three times a day as needed for back pain ONETOUCH ULTRA TEST  STRP (GLUCOSE BLOOD) check blood sugar twice a day and as needed  for diabetes 250.0 Current Allergies (reviewed today): ! CODEINE ! RELAFEN ! * ZETIA ! LIPITOR ! * BEE STINGS ! AUGMENTIN Current Medications (including changes made in today's visit):  GLUCOPHAGE 500 MG TABS (METFORMIN HCL) 1 by mouth two times a day FLEXERIL 10 MG TABS (CYCLOBENZAPRINE HCL) 1/2 to 1 by mouth up to three times a day as needed for back pain ONETOUCH ULTRA TEST  STRP (GLUCOSE BLOOD) check blood sugar twice a day and as needed  for diabetes 250.0 * FLAX SEED once daily BENICAR 40 MG TABS (OLMESARTAN MEDOXOMIL) Take one tablet by mouth daily CRESTOR 5 MG TABS (ROSUVASTATIN CALCIUM) Take one tablet by mouth every other day    EKG  Procedure date:  12/14/2008  Findings:      NSR with rate of 70 and no acute change some artifact

## 2010-08-08 NOTE — Assessment & Plan Note (Signed)
Summary: 2:30 COUGHING THROAT FEELS LIKE IT IS SWELLING/RBH   Vital Signs:  Patient Profile:   75 Years Old Female Height:     69 inches Weight:      208 pounds Temp:     98.1 degrees F oral Pulse rate:   76 / minute Pulse rhythm:   regular BP sitting:   130 / 80  (left arm) Cuff size:   large  Vitals Entered By: Liane Comber (May 23, 2008 2:37 PM)                 PCP:  Roxy Manns, M.D.  Chief Complaint:  coughing.  History of Present Illness: is really hoarse and has mucous in her throat constantly tries to clear it  feels swollen in thoat some cough - a little yellow phlegm- light  nose is running - a little stuffy throat is not sore - just full feeling -- made her feel like trouble breathing   has been visiting sister and sil in hosp- exp to a lot of stuff     Current Allergies (reviewed today): ! CODEINE ! RELAFEN ! * ZETIA ! LIPITOR ! * BEE STINGS  Past Medical History:    Reviewed history from 02/14/2008 and no changes required:       Skin cancer, hx of- basal cell/ ak's       Diabetes mellitus, type II       GERD       Dysphagia       colon polyps       deg disk- spine              GI-- Merri Brunette-- Pearlean Brownie  Past Surgical History:    Reviewed history from 01/03/2008 and no changes required:       Appendectomy       BTL       Breast biopsy       Deca- normal (03/2000)       Heart cath- neg. (1993)       Cardiolite- neg. (11/1999)       Shoulder impingement (10/2002)       Abd. [place]- neg. (07/2003)       Tear duct surgery (2005)       Sinus surgery (01/2005)       colonoscopy- adenomatous colon polyps (6/09)   Family History:    Reviewed history from 11/26/2007 and no changes required:       brother- lung cancer- smoker       sister had 5 back operations        Family History of Colon Polyps:  Social History:    Reviewed history from 11/26/2007 and no changes required:       Never Smoked       retired 3 children, does  not drink alcohol, does not drink caffeinated beverages.       cares for SIL with dementia    Review of Systems  General      Complains of fatigue.      Denies chills and fever.  Eyes      Denies eye irritation and itching.  ENT      Complains of hoarseness, nasal congestion, and postnasal drainage.      Denies ear discharge, earache, and sore throat.  CV      Denies chest pain or discomfort and palpitations.  Resp      Complains of cough and sputum productive.  Denies shortness of breath and wheezing.  GI      Complains of vomiting.      Denies nausea.      vomited/coughed up phlegm this am   Derm      Denies rash.  Psych      lots of stress with sick family members    Physical Exam  General:     overweight but generally well appearing  Head:     normocephalic, atraumatic, and no abnormalities observed.   Eyes:     vision grossly intact, pupils equal, pupils round, pupils reactive to light, and no injection.   Ears:     R ear normal and L ear normal.   Nose:     nares are boggy and congested with clear rhinorrhea  Mouth:     pharynx pink and moist, no erythema, and no exudates.   no swelling of mouth or throat noted  very hoarse voice  much clear post nasal mucous drainage  Neck:     supple with full rom and no masses or thyromegally, no JVD or carotid bruit  Chest Wall:     No deformities, masses, or tenderness noted. Lungs:     harsh bs at bases- but CTA with good air exch no rales or rhonchi no wheeze or stridor  no labored breathing , no prol exp phase  Heart:     Normal rate and regular rhythm. S1 and S2 normal without gallop, murmur, click, rub or other extra sounds. Skin:     Intact without suspicious lesions or rashes Cervical Nodes:     No lymphadenopathy noted Psych:     normal affect, talkative and pleasant     Impression & Recommendations:  Problem # 1:  LARYNGITIS-ACUTE (ICD-464.00) Assessment: New with viral uri and  hoarseness re assured pt no signs of airway obst on exam  rec rest and sympt care - mucinex DM, fluids, saline , vaporizer tessalon given if cough inc (pt is codiene intolerant) adv to update if worse or if any sob- or if not imp in 1 week   Complete Medication List: 1)  Glucophage 500 Mg Tabs (Metformin hcl) .Marland Kitchen.. 1 by mouth two times a day 2)  Lisinopril 5 Mg Tabs (Lisinopril) .Marland Kitchen.. 1 by mouth once daily 3)  Tessalon 200 Mg Caps (Benzonatate) .Marland Kitchen.. 1 by mouth up to three times a day as needed cough   Patient Instructions: 1)  you can use mucinex DM over the counter for cough and head/ chest congestion 2)  drink lots of water  3)  tylenol is ok if any aches/ fever/chills (update me if fever above 101.5) 4)  nasal saline spray may help if you get stuffy nose  5)  a vaporizer in bedroom may help 6)  rest your voice as much as possible  7)  if cough is more severe - try the tessalon px  8)  if short of breath - call (seek care in ER if after hours) 9)  if not starting to improve later in week- update me/ follow up   Prescriptions: TESSALON 200 MG CAPS (BENZONATATE) 1 by mouth up to three times a day as needed cough  #30 x 0   Entered and Authorized by:   Judith Part MD   Signed by:   Judith Part MD on 05/23/2008   Method used:   Print then Give to Patient   RxID:   [id number]  ]

## 2010-08-08 NOTE — Progress Notes (Signed)
Summary: Apprehension about Ticlid  Phone Note Call from Patient Call back at Home Phone [phone] Call back at Cell:  [phone]   Caller: Patient Call For: Judith Part MD Summary of Call: Patient was in today for her B-12 shot and asked to speak to you.  I encouraged her to leave the message with me.  She says she is very apprehensive about taking this Ticlid because of the side-effects, yet she's afraid not to take it.  She was hoping for some reassurance from you or perhaps an alternative.  She is leaving on vacation tomorrow around lunch time.  She is not going to start the Ticlid until she returns from vacation.  Should she make an appointment with you on her return before starting the Ticlid? Initial call taken by: Delilah Shan CMA Duncan Dull),  January 02, 2010 9:29 AM  Follow-up for Phone Call        she really needs to talk to Dr Pearlean Brownie about this since he is px doc  sounds like she has to be on blood thinner - and if she does not want to take this one they need to disc alt choices if there are any I do not know nearly as much as him- and unlikely I can answer these questions  enc her to call his office asap Follow-up by: Judith Part MD,  January 02, 2010 9:39 AM  Additional Follow-up for Phone Call Additional follow up Details #1::        Left message for patient to call back. Lewanda Rife LPN  January 02, 2010 10:05 AM   Patient notified as instructed by telephone. Pt has tried to talk with Dr Pearlean Brownie and she will try to call again.Lewanda Rife LPN  January 02, 2010 10:41 AM

## 2010-08-08 NOTE — Letter (Signed)
Summary: [facility] / PAIN IN LEFT SIDE OF NECK / DR. RIC  [facility] / PAIN IN LEFT SIDE OF NECK / DR. Sheran Luz   Imported By: Carin Primrose 10/31/2008 15:45:17  _____________________________________________________________________  External Attachment:    Type:   Image     Comment:   External Document

## 2010-08-08 NOTE — Assessment & Plan Note (Signed)
Review of gastrointestinal problems: 1. Adenomatous colon polyps, removed during colonoscopy June 2009. Next colonoscopy at five-year interval    History of Present Illness Visit Type: Follow-up Visit Primary GI MD: Owens Loffler MD Primary Provider: Loura Pardon, M.D. Requesting Provider: n/a Chief Complaint: diarrhea History of Present Illness:     very pleasant 75 year old woman whom I last saw about 2-1/2 years ago at the time of a colonoscopy. See those results summarized above. who has been having about 6 months of loose stools.  Stool caliber changed several months ago.  Very loose, 3-4 times a day.  +urgency.  + belching.  No overt Gi bleeding.  she has been on 2grams of metformin 5-6 years ago.  Cut back on splenda.  Drinks rare caffeine.  Overall she has lost 5-6 pounds, unintentionally.  she was on abx for UTI 1 month ago.  Diabetic for 15 years or so.  crestor and ticlid are both fairly new (past 6-9 months).           Current Medications (verified): 1)  Metformin Hcl 1000 Mg Tabs (Metformin Hcl) .Marland Kitchen.. 1 By Mouth Two Times A Day 2)  Onetouch Ultra Test  Strp (Glucose Blood) .... Check Blood Sugar Twice A Day and As Needed  For Diabetes 250.0 3)  Crestor 5 Mg Tabs (Rosuvastatin Calcium) .... Take One Tablet By Mouth Every Other Day 4)  Benicar 40 Mg Tabs (Olmesartan Medoxomil) .Marland Kitchen.. 1 By Mouth Once Daily 5)  B12 Shot .... One Monthly 6)  B Complex Vitamin Otc .... One By Mouth Once Daily 7)  Ticlopidine Hcl 250 Mg Tabs (Ticlopidine Hcl) .... Take One By Mouth Twice Daily  Allergies (verified): 1)  ! Codeine 2)  ! Relafen 3)  ! * Zetia 4)  ! Lipitor 5)  ! * Bee Stings 6)  ! Augmentin 7)  ! Aspirin 8)  ! Diovan 9)  ! * Plavix  Family History: brother- lung cancer- smoker sister had 5 back operations  Family History of Colon Polyps: no colon cancer  Social History: Never Smoked retired 3 children, does not drink alcohol, does not drink caffeinated  beverages. cares for SIL with dementia    Vital Signs:  Patient profile:   75 year old female Height:      69 inches Weight:      206 pounds BMI:     30.53 BSA:     2.09 Pulse rate:   80 / minute Pulse rhythm:   regular BP sitting:   122 / 64  (left arm)  Vitals Entered By: [place] Deborra Medina) (July 26, 2010 1:59 PM)  Physical Exam  Additional Exam:  Constitutional: generally well appearing Psychiatric: alert and oriented times 3 Abdomen: soft, non-tender, non-distended, normal bowel sounds    Impression & Recommendations:  Problem # 1:  loose stools, urgency to change in her stools started happening around the time she began taking Ticlid. #1 side effect of this medicine is diarrhea. She had a colonoscopy 2 years ago and I don't think that that needs to be repeated at this point. She'll get a basic set of lab work and stool testing done. She will begin taking one Imodium every morning shortly after she wakes up and she will call my office to report on her symptoms in 3-4 weeks.  Other Orders: TLB-CBC Platelet - w/Differential (85025-CBCD) TLB-CMP (Comprehensive Metabolic Pnl) (0000000) TLB-TSH (Thyroid Stimulating Hormone) (84443-TSH) TLB-Sedimentation Rate (ESR) (85652-ESR) T-C diff by PCR [id number]) T-Fecal WBC [id number]) T-Stool  for O&P [phone]) T-Stool Giardia / Crypto- EIA (09811)  Patient Instructions: 1)  Start immodium, one pill shortly after you wake up. 2)  Number one side effect of ticlid is diarrhea, this could be contributing to your stool. 3)  You will get lab test(s) done today (cbc, cmet, tsh, esr, stool for c. diff by pcr, fecal leuks, ova/parasites, giardia). 4)  Call Dr. Ardis Hughs' to report on how the immodium is helping in 3-4 weeks. 5)  A copy of this information will be sent to Dr. Glori Bickers.  6)  The medication list was reviewed and reconciled.  All changed / newly prescribed medications were explained.  A complete medication list was  provided to the patient / caregiver.

## 2010-08-08 NOTE — Miscellaneous (Signed)
Summary: pap results   Clinical Lists Changes  Observations: Added new observation of PAP SMEAR: normal (11/12/2007 8:22)       Preventive Care Screening  Pap Smear:    Date:  11/12/2007    Results:  normal

## 2010-08-08 NOTE — Letter (Signed)
Summary: [facility]  [facility]   Imported By: Maryln Gottron 03/01/2010 14:59:54  _____________________________________________________________________  External Attachment:    Type:   Image     Comment:   External Document

## 2010-08-08 NOTE — Letter (Signed)
Summary: NP/[facility]  NP/[facility]   Imported By: Sherian Rein 07/27/2009 09:42:02  _____________________________________________________________________  External Attachment:    Type:   Image     Comment:   External Document

## 2010-08-08 NOTE — Assessment & Plan Note (Signed)
Medications Added * FLAX SEED once daily        Primary Provider:  Roxy Manns, M.D.  CC:  Chest pain/pressure in neck.  History of Present Illness: 75 yo with hiistory of DM2, HTN, hyperlipidemia presents for evaluation of chest pain.  Since 12/09, patient has had episodes of pressure in her anterior neck and pressure across the chest.  There episodes come and go and occur daily.  They are not always associated with exertion.  They last minutes to hours.  Sometimes she wakes up at night with the pain.  It has been thought that the pain may be related to her C-spine stenosis.  In fact, with a steroid shot in her neck she was pain-free for about 2 days (then it returned).  She also occasionally gets exertional chest tightness.  This occurs after walking for 15 minutes on flat ground.  She has noticed this for 2 wks.  The exertional CP resolves with rest.    ECG:  NSR, normal  Labs (6/10):  LDL 125, HDL 44, TG 233, HCT 33, creatinine 0.8, K 4.3  Current Medications (verified): 1)  Glucophage 500 Mg Tabs (Metformin Hcl) .Marland Kitchen.. 1 By Mouth Two Times A Day 2)  Benicar 20 Mg Tabs (Olmesartan Medoxomil) .Marland Kitchen.. 1 By Mouth Once Daily 3)  Flexeril 10 Mg Tabs (Cyclobenzaprine Hcl) .... 1/2 To 1 By Mouth Up To Three Times A Day As Needed For Back Pain 4)  Onetouch Ultra Test  Strp (Glucose Blood) .... Check Blood Sugar Twice A Day and As Needed  For Diabetes 250.0 5)  Flax Seed .... Once Daily  Allergies: 1)  ! Codeine 2)  ! Relafen 3)  ! * Zetia 4)  ! Lipitor 5)  ! * Bee Stings 6)  ! Augmentin  Past History:  Past Medical History: 1.  Aspirin allergy (hives) 2.  C-spine stenosis with neck pain.  3.  HTN 4.  DIabetes type II 5.  LHC 1993: normal coronaries.  Stress cardiolite 2001 normal.  6.  Hyperlipidemia.  Myalgias with lipitor and Zetia.  7.  Skin cancer, hx of- basal cell/ ak's 8.  GERD 9.  Dysphagia 10. colon polyps  GI-- Christella Hartigan neurol-- Pearlean Brownie  Family History: Reviewed  history from 11/26/2007 and no changes required. brother- lung cancer- smoker sister had 5 back operations  Family History of Colon Polyps:  Social History: Reviewed history from 05/23/2008 and no changes required. Never Smoked retired 3 children, does not drink alcohol, does not drink caffeinated beverages. cares for SIL with dementia  Review of Systems       All reviewed and negative except as per HPI.   Vital Signs:  Patient profile:   75 year old female Height:      69 inches Weight:      209 pounds BMI:     30.98 Pulse rate:   82 / minute BP sitting:   171 / 78  (left arm) Cuff size:   regular  Vitals Entered By: Stanton Kidney, EMT-P (December 14, 2008 2:47 PM)  Physical Exam  General:  Well developed, well nourished, in no acute distress.  Mildly obese.  Head:  normocephalic and atraumatic Nose:  no deformity, discharge, inflammation, or lesions Mouth:  Teeth, gums and palate normal. Oral mucosa normal. Neck:  Neck supple, no JVD. No masses, thyromegaly or abnormal cervical nodes. Lungs:  Clear bilaterally to auscultation and percussion. Heart:  Non-displaced PMI, chest non-tender; regular rate and rhythm, S1, S2 without murmurs,  rubs or gallops. Carotid upstroke normal, no bruit. Pedals normal pulses. No edema, no varicosities. Abdomen:  Bowel sounds positive; abdomen soft and non-tender without masses, organomegaly, or hernias noted. No hepatosplenomegaly. Msk:  Back normal, normal gait. Muscle strength and tone normal. Extremities:  No clubbing or cyanosis. Neurologic:  Alert and oriented x 3. Skin:  Intact without lesions or rashes. Psych:  Normal affect.   Impression & Recommendations:  Problem # 1:  CHEST PAIN (ICD-786.50) Ms. Mendel has chest pain with both typical and atypical features.  It very well may all be due to c-spine stenosis.  However, the exertional episodes that she has had in the last 2 weeks are worrisome.  She does have multiple risk factors for  CAD.  I will set her up for an ETT-myoview.  She is unable to take ASA due to a true allergy.    Problem # 2:  HYPERLIPIDEMIA (ICD-272.4)  LDL is higher than I would like it with diabetes.  She could not tolerate Lipitor or Zetia.  I will try her on Crestor 5 mg by mouth every other day.  This usually is a well-tolerate statin regimen.   Her updated medication list for this problem includes:    Crestor 5 Mg Tabs (Rosuvastatin calcium) .Marland Kitchen... Take one tablet by mouth every other day  Problem # 3:  HYPERTENSION, ESSENTIAL NOS (ICD-401.9) BP is running high today. I will increase olmesartan to 40 mg daily and have her followup for chem 7 in 2 wks.

## 2010-08-08 NOTE — Progress Notes (Signed)
Summary: urine has strong odor  Phone Note Call from Patient Call back at [phone]   Caller: Patient Call For: Judith Part MD Summary of Call: Pt says her urine has a strong odor, very bad. She is unable to describe the odor.  This has been going on for awhile. She had it checked a couple of weeks, by neurologist, and it was ok.  She is just asking if you think this is unusual, should she have it checked again?  She is not having any UTI sxs. Initial call taken by: Lowella Petties CMA,  March 25, 2010 10:57 AM  Follow-up for Phone Call        sometimes certain foods -- especially asparagus can cause it  if not eating that -- f/u with me this week and we will double check it and culture it  of course - update me if more symptoms Follow-up by: Judith Part MD,  March 25, 2010 11:38 AM  Additional Follow-up for Phone Call Additional follow up Details #1::        Patient Advised.   Appointment scheduled. Additional Follow-up by: Delilah Shan CMA Duncan Dull),  March 25, 2010 11:56 AM

## 2010-08-08 NOTE — Progress Notes (Signed)
Summary: B 12 question  Phone Note Call from Patient Call back at [phone]   Caller: Patient Call For: Judith Part MD Summary of Call: Pt was in this AM for B 12 injection. Pt wanted to get next months B12 injection when saw Dr. Milinda Antis on 09/03/09 at 9:00am instead of having to make special trip for B 12 next month. I told pt that should be OK to wait until 09/03/09 for B12 injection. Pt said would schedule B 12 injection for March when she came in on 09/03/09. Initial call taken by: Lewanda Rife LPN,  July 31, 2009 10:37 AM

## 2010-08-08 NOTE — Op Note (Signed)
Summary: [facility] CERVICAL S  [facility] / CERVICAL SPONDYLOSIS, FORAMINAL STENOSIS ON THE LEFT / DR. Gerlene Burdock RAMOS   Imported By: Carin Primrose 10/31/2008 14:19:06  _____________________________________________________________________  External Attachment:    Type:   Image     Comment:   External Document

## 2010-08-29 ENCOUNTER — Encounter: Payer: Self-pay | Admitting: Family Medicine

## 2010-08-29 ENCOUNTER — Ambulatory Visit (INDEPENDENT_AMBULATORY_CARE_PROVIDER_SITE_OTHER): Payer: Medicare Other

## 2010-08-29 DIAGNOSIS — E538 Deficiency of other specified B group vitamins: Secondary | ICD-10-CM

## 2010-09-03 NOTE — Assessment & Plan Note (Signed)
Summary: B12 INJECTION   Nurse Visit   Vital Signs:  Patient profile:   75 year old female Weight:      207 pounds Pulse rate:   64 / minute BP sitting:   148 / 88  (left arm) Cuff size:   regular  Vitals Entered By: Lowella Petties CMA, AAMA (August 29, 2010 9:35 AM) CC: Nurse visit, BP check Comments Pt states she has had a headache today.  She checked her BP before she took her medicine this morning and it was 178/78.   Allergies: 1)  ! Codeine 2)  ! Relafen 3)  ! * Zetia 4)  ! Lipitor 5)  ! * Bee Stings 6)  ! Augmentin 7)  ! Aspirin 8)  ! Diovan 9)  ! * Plavix  Medication Administration  Injection # 1:    Medication: Vit B12 1000 mcg    Diagnosis: VITAMIN B12 DEFICIENCY (ICD-266.2)    Route: IM    Site: R deltoid    Exp Date: 01/2012    Lot #: [id number]    Mfr: American Regent    Patient tolerated injection without complications    Given by: Lowella Petties CMA, AAMA (August 29, 2010 9:34 AM)  Orders Added: 1)  Vit B12 1000 mcg [J3420] 2)  Admin of Therapeutic Inj  intramuscular or subcutaneous [96372] 3)  Est. Patient Level I [16109]   Medication Administration  Injection # 1:    Medication: Vit B12 1000 mcg    Diagnosis: VITAMIN B12 DEFICIENCY (ICD-266.2)    Route: IM    Site: R deltoid    Exp Date: 01/2012    Lot #: [id number]    Mfr: American Regent    Patient tolerated injection without complications    Given by: Lowella Petties CMA, AAMA (August 29, 2010 9:34 AM)  Orders Added: 1)  Vit B12 1000 mcg [J3420] 2)  Admin of Therapeutic Inj  intramuscular or subcutaneous [96372] 3)  Est. Patient Level I [60454]

## 2010-11-22 NOTE — Op Note (Signed)
NAME:  Margaret Vazquez, Margaret Vazquez                ACCOUNT NO.:  [id number]   MEDICAL RECORD NO.:  [id number]          PATIENT TYPE:  AMB   LOCATION:  [facility]                          FACILITY:  [facility]   PHYSICIAN:  Christopher E. Ezzard Standing, M.D.DATE OF BIRTH:  [birth year]   DATE OF PROCEDURE:  08/26/2005  DATE OF DISCHARGE:                                 OPERATIVE REPORT   PREOPERATIVE DIAGNOSIS:  Chronic left maxillary sinus disease.   POSTOPERATIVE DIAGNOSIS:  Chronic left maxillary sinus disease.   OPERATION PERFORMED:  Functional endoscopic sinus surgery with left  maxillary ostial enlargement and irrigation of left maxillary sinus.   SURGEON:  Kristine Garbe. Ezzard Standing, M.D.   ANESTHESIA:  General endotracheal.   COMPLICATIONS:  None.   INDICATIONS FOR PROCEDURE:  [name] is a 75 year old female who had had  previous history of chronic sinus disease, had previous surgery about a year  ago.  She had resolution of her ethmoid frontal disease but has had  persistent blockage of the left maxillary sinus with a purulent discharge.  She is taken to the operating room at this time for repeat functional  endoscopic sinus surgery with enlargement of left maxillary ostia and  irrigation of the sinus.   DESCRIPTION OF PROCEDURE:  After adequate endotracheal anesthesia, using the  30 degree scope, endoscopy was performed first.  The ethmoid frontal area  was clear of any disease.  The maxillary ostia was stenosed.  The small 1 mm  opening with a purulent discharge from the small opening.  Area was injected  with Xylocaine with epinephrine for local anesthetic.  Then using back  biting, rotating back biting forceps, the ostia was enlarged to  approximately 1.5 cm in size.  There is purulence within the maxillary ostia  with thickened mucoperiosteal tissue in the sinus.  After opening the ostia,  the sinus was irrigated with saline 10 mL x 4.  This completed the  procedure. Hemostasis was obtained with  suction cautery.  Following  completion of the procedure, no packing was required.  Bacitracin ointment  was placed within the middle meatus region on the left side.  [name] was  awakened from anesthesia and transferred to recovery postoperatively doing  well.  She received 1 g Ancef IV intraoperatively.   DISPOSITION:  [name] is discharged home later this morning on Keflex 500 mg  twice daily for one week, Tylenol and Darvocet-N 100 1 to 2 every four hours  as needed for pain.  Will have her follow up in my office in one week for  recheck.           ______________________________  Kristine Garbe. Ezzard Standing, M.D.    CEN/MEDQ  D:  08/26/2005  T:  08/26/2005  Job:  [id number]

## 2010-11-22 NOTE — Op Note (Signed)
NAME:  KAYLEN, Margaret Vazquez                ACCOUNT NO.:  [id number]   MEDICAL RECORD NO.:  [id number]          PATIENT TYPE:  AMB   LOCATION:  [place]                          FACILITY:  [facility]   PHYSICIAN:  Christopher E. Lucia Gaskins, M.D.DATE OF BIRTH:  Apr 14, 1936   DATE OF PROCEDURE:  01/10/2005  DATE OF DISCHARGE:                                 OPERATIVE REPORT   PREOPERATIVE DIAGNOSIS:  Chronic left frontal, left ethmoid and left  maxillary sinus disease.   POSTOPERATIVE DIAGNOSIS:  Chronic left frontal, left ethmoid and left  maxillary sinus disease.   OPERATION PERFORMED:  Functional endoscopic sinus surgery with left  ethmoidectomy, left maxillary ostial enlargement with irrigation of the  maxillary sinus, exploration of left nasal frontal duct region.   SURGEON:  Leonides Sake. Lucia Gaskins, M.D.   ANESTHESIA:  General endotracheal.   COMPLICATIONS:  None.   INDICATIONS FOR PROCEDURE:  [name] [name] is a 75 year old female who has had  chronic drainage of a purulent discharge from her left nose for several  months.  She has been treated with antibiotics.  On CT scan she has complete  opacification of the left frontal, left maxillary sinus and partial  opacification of the left ethmoid area consistent with possible mucocele.  On exam she had a purulent discharge from the left middle meatus.  The right  is clear.  She is taken to the operating room at this time for left FESS.   DESCRIPTION OF PROCEDURE:  After adequate endotracheal anesthesia, the left  nose was prepped with cotton pledgets soaked in decongestant solution and  the left middle meatus was injected with Xylocaine with epinephrine.  On  exam the left side was bulging in the region of the middle meatus with a  purulent discharge consistent with probably mucocele.  A sickle knife was  used to open up the left middle turbinate concha bullosa as well as the  anterior ethmoid region.  There was a large amount of purulent debris  within  the ethmoid air cells anteriorly as well as coming from the maxillary ostia  which was enlarged with back biting and straight through cut forceps.  Purulent debris was also drained from the left nasal frontal area.  This  area was opened up with through cut forceps using a 30 degree scope.  After  opening up the nasal frontal area, the curved suction was placed within the  frontal sinus.  The posterior ethmoid region was actually fairly clear.  Some of the posterior ethmoid cells were likewise opened up.  The left  maxillary sinus was irrigated with saline as there was a large amount of  purulent debris within the left maxillary sinus.  After opening up the  ethmoid maxillary frontal region, a Kennedy sinus pack was placed.  The  inferior turbinate was outfractured.  This completed the procedure.  Lelan Pons  was awakened from anesthesia and transferred to recovery room  postoperatively doing well.   DISPOSITION:  Lelan Pons is discharged home later this morning on Keflex 500 mg  twice daily for one week, Tylenol and Vicodin as needed for pain.  She  received 1 g Ancef intraoperatively.  Will have her follow up in my office  in three days for recheck and remove the [facility] sinus pack.        CEN/MEDQ  D:  01/10/2005  T:  01/10/2005  Job:  [id number]

## 2010-11-23 ENCOUNTER — Encounter: Payer: Self-pay | Admitting: Family Medicine

## 2010-11-26 ENCOUNTER — Other Ambulatory Visit: Payer: Self-pay | Admitting: Family Medicine

## 2010-11-26 DIAGNOSIS — I10 Essential (primary) hypertension: Secondary | ICD-10-CM

## 2010-11-26 DIAGNOSIS — D649 Anemia, unspecified: Secondary | ICD-10-CM

## 2010-11-26 DIAGNOSIS — E7800 Pure hypercholesterolemia, unspecified: Secondary | ICD-10-CM

## 2010-11-27 ENCOUNTER — Other Ambulatory Visit (INDEPENDENT_AMBULATORY_CARE_PROVIDER_SITE_OTHER): Payer: Medicare Other | Admitting: Family Medicine

## 2010-11-27 DIAGNOSIS — D649 Anemia, unspecified: Secondary | ICD-10-CM

## 2010-11-27 DIAGNOSIS — E7800 Pure hypercholesterolemia, unspecified: Secondary | ICD-10-CM

## 2010-11-27 DIAGNOSIS — E119 Type 2 diabetes mellitus without complications: Secondary | ICD-10-CM

## 2010-11-27 DIAGNOSIS — I10 Essential (primary) hypertension: Secondary | ICD-10-CM

## 2010-11-27 LAB — CBC WITH DIFFERENTIAL/PLATELET
Basophils Absolute: 0 10*3/uL (ref 0.0–0.1)
Basophils Relative: 0.7 % (ref 0.0–3.0)
Eosinophils Absolute: 0.2 10*3/uL (ref 0.0–0.7)
Eosinophils Relative: 2.9 % (ref 0.0–5.0)
HCT: 38.7 % (ref 36.0–46.0)
Hemoglobin: 13 g/dL (ref 12.0–15.0)
Lymphocytes Relative: 34.8 % (ref 12.0–46.0)
Lymphs Abs: 2.3 10*3/uL (ref 0.7–4.0)
MCHC: 33.5 g/dL (ref 30.0–36.0)
MCV: 94.5 fl (ref 78.0–100.0)
Monocytes Absolute: 0.4 10*3/uL (ref 0.1–1.0)
Monocytes Relative: 7 % (ref 3.0–12.0)
Neutro Abs: 3.5 10*3/uL (ref 1.4–7.7)
Neutrophils Relative %: 54.6 % (ref 43.0–77.0)
Platelets: 214 10*3/uL (ref 150.0–400.0)
RBC: 4.1 Mil/uL (ref 3.87–5.11)
RDW: 13.7 % (ref 11.5–14.6)
WBC: 6.5 10*3/uL (ref 4.5–10.5)

## 2010-11-27 LAB — AST: AST: 14 U/L (ref 0–37)

## 2010-11-27 LAB — ALT: ALT: 15 U/L (ref 0–35)

## 2010-11-27 LAB — LIPID PANEL
Cholesterol: 152 mg/dL (ref 0–200)
HDL: 45.9 mg/dL (ref >39.00)
LDL Cholesterol: 78 mg/dL (ref 0–99)
Total CHOL/HDL Ratio: 3
Triglycerides: 142 mg/dL (ref 0.0–149.0)
VLDL: 28.4 mg/dL (ref 0.0–40.0)

## 2010-11-27 LAB — RENAL FUNCTION PANEL
Albumin: 3.9 g/dL (ref 3.5–5.2)
BUN: 14 mg/dL (ref 6–23)
CO2: 29 mEq/L (ref 19–32)
Calcium: 9.2 mg/dL (ref 8.4–10.5)
Chloride: 106 mEq/L (ref 96–112)
Creatinine, Ser: 0.8 mg/dL (ref 0.4–1.2)
GFR: 74.7 mL/min (ref >60.00)
Glucose, Bld: 128 mg/dL — ABNORMAL HIGH (ref 70–99)
Phosphorus: 3.9 mg/dL (ref 2.3–4.6)
Potassium: 4.7 mEq/L (ref 3.5–5.1)
Sodium: 141 mEq/L (ref 135–145)

## 2010-11-27 LAB — TSH: TSH: 3.56 u[IU]/mL (ref 0.35–5.50)

## 2010-11-27 LAB — HEMOGLOBIN A1C: Hgb A1c MFr Bld: 6.8 % — ABNORMAL HIGH (ref 4.6–6.5)

## 2010-12-03 ENCOUNTER — Ambulatory Visit (INDEPENDENT_AMBULATORY_CARE_PROVIDER_SITE_OTHER): Payer: Medicare Other | Admitting: Family Medicine

## 2010-12-03 ENCOUNTER — Encounter: Payer: Self-pay | Admitting: Family Medicine

## 2010-12-03 DIAGNOSIS — E119 Type 2 diabetes mellitus without complications: Secondary | ICD-10-CM

## 2010-12-03 DIAGNOSIS — D649 Anemia, unspecified: Secondary | ICD-10-CM

## 2010-12-03 DIAGNOSIS — Z23 Encounter for immunization: Secondary | ICD-10-CM

## 2010-12-03 DIAGNOSIS — E538 Deficiency of other specified B group vitamins: Secondary | ICD-10-CM

## 2010-12-03 DIAGNOSIS — I10 Essential (primary) hypertension: Secondary | ICD-10-CM

## 2010-12-03 DIAGNOSIS — E785 Hyperlipidemia, unspecified: Secondary | ICD-10-CM

## 2010-12-03 MED ORDER — CYANOCOBALAMIN 1000 MCG/ML IJ SOLN
1000.0000 ug | Freq: Once | INTRAMUSCULAR | Status: AC
  Administered 2010-12-03: 1000 ug via INTRAMUSCULAR

## 2010-12-03 MED ORDER — OLMESARTAN MEDOXOMIL 40 MG PO TABS: 40.0000 mg | ORAL_TABLET | Freq: Every day | ORAL | Status: DC

## 2010-12-03 NOTE — Assessment & Plan Note (Signed)
Shot today 

## 2010-12-03 NOTE — Progress Notes (Signed)
Subjective:    Patient ID: Margaret Vazquez, female    DOB: 23-Apr-1936, 75 y.o.   MRN: [id number]  HPI Here for f/u of DM and lipids and HTN  Is feeling fair overall  No energy in general -- takes care of her sister in law and rest of family = is very difficult - they are unpleasant people   Has f/u Dr Leonie Man next month  Wt is down 3 lb with bmi of 30  DM - a1c is 6.8 up from 6.6 - not much change Does check her sugar at home - in am low one-teens to 130 (? [place] in the middle of the night) After eating dinner 140 On metformin and benicar  opthy utd 3/11- no changes in vision  Lipids are well controlled on crestor Lab Results  Component Value Date   CHOL 152 11/27/2010   CHOL 162 05/27/2010   CHOL 150 11/27/2009   Lab Results  Component Value Date   HDL 45.90 11/27/2010   HDL 42.20 05/27/2010   HDL 42.20 11/27/2009   Lab Results  Component Value Date   LDLCALC 78 11/27/2010   LDLCALC 89 05/27/2010   LDLCALC 79 11/27/2009   Lab Results  Component Value Date   TRIG 142.0 11/27/2010   TRIG 155.0* 05/27/2010   TRIG 145.0 11/27/2009   Lab Results  Component Value Date   CHOLHDL 3 11/27/2010   CHOLHDL 4 05/27/2010   CHOLHDL 4 11/27/2009   Lab Results  Component Value Date   LDLDIRECT 134.7 08/08/2008   LDLDIRECT 143.0 04/27/2007     HTN is fair 138/64 today first check  No cp or headache or palpitations  Tailbone is painful to sit on hard chairs -- going on for years- but going on for years- does not want to work it up at this time   Past Medical History  Diagnosis Date  . Hypertension   . Diabetes mellitus     type II  . Hyperlipidemia     myalgias with Lipitor and Zetia  . GERD (gastroesophageal reflux disease)   . Skin cancer     hx of basal cell/ak's  . Colon polyps 2009    History   Social History  . Marital Status: Married    Spouse Name: N/A    Number of Children: N/A  . Years of Education: N/A   Occupational History  . Not on file.   Social  History Main Topics  . Smoking status: Never Smoker   . Smokeless tobacco: Not on file  . Alcohol Use:   . Drug Use:   . Sexually Active:    Other Topics Concern  . Not on file   Social History Narrative  . No narrative on file        Review of Systems Review of Systems  Constitutional: Negative for fever, appetite change, fatigue and unexpected weight change.  Eyes: Negative for pain and visual disturbance.  Respiratory: Negative for cough and shortness of breath.   Cardiovascular: Negative. For cp or palpitations  Gastrointestinal: Negative for nausea, diarrhea and constipation.  Genitourinary: Negative for urgency and frequency.  Skin: Negative for pallor. MSK pos for tailbone sorness - for "years"  Neurological: Negative for weakness, light-headedness, numbness and headaches.  Hematological: Negative for adenopathy. Does not bruise/bleed easily.  Psychiatric/Behavioral: Negative for dysphoric mood. The patient is not nervous/anxious.          Objective:   Physical Exam  Constitutional: She appears well-developed and well-nourished.  No distress.  HENT:  Head: Normocephalic and atraumatic.  Mouth/Throat: Oropharynx is clear and moist.  Eyes: Conjunctivae and EOM are normal. Pupils are equal, round, and reactive to light.  Neck: Normal range of motion. Neck supple. No JVD present. No thyromegaly present.  Cardiovascular: Normal rate, regular rhythm, normal heart sounds and intact distal pulses.   Pulmonary/Chest: Effort normal and breath sounds normal. No respiratory distress. She has no wheezes.  Abdominal: Soft. Bowel sounds are normal.  Musculoskeletal: She exhibits no tenderness.  Lymphadenopathy:    She has no cervical adenopathy.  Neurological: She is alert. No cranial nerve deficit. She exhibits normal muscle tone.  Skin: Skin is warm and dry. No rash noted. No erythema. No pallor.  Psychiatric: She has a normal mood and affect.          Assessment &  Plan:

## 2010-12-03 NOTE — Patient Instructions (Signed)
Please stick closely to a diabetic diet - with smaller portions Weight loss would help  Try to aim for exercise 5 days per week -- at least 20 minutes  No change in medicines  If tailbone pain worsens - let me know and we will do x ray  Pneumonia vaccine today  B12 shot today too  Schedule non fasting lab and then follow up in about 3 months

## 2010-12-03 NOTE — Assessment & Plan Note (Signed)
Overall fair control but a1c is up a bit Rev low glycemic diet Needs to loose wt  No time to exercise- disc making this a priority  No change in med opthy utd  On arb Re check a1c 3 mo and f/u

## 2010-12-03 NOTE — Assessment & Plan Note (Signed)
Fair control 130/80 on 2nd check Some stress issues emph imp of exercise  F/u 3 mo

## 2010-12-03 NOTE — Assessment & Plan Note (Signed)
Great control with crestor and diet  Rev labs with pt  commended

## 2010-12-03 NOTE — Assessment & Plan Note (Signed)
Labs look good   no longer donating blood Rev with pt

## 2010-12-20 ENCOUNTER — Encounter: Payer: Self-pay | Admitting: Family Medicine

## 2010-12-20 ENCOUNTER — Ambulatory Visit (INDEPENDENT_AMBULATORY_CARE_PROVIDER_SITE_OTHER): Payer: Medicare Other | Admitting: Family Medicine

## 2010-12-20 DIAGNOSIS — T148 Other injury of unspecified body region: Secondary | ICD-10-CM

## 2010-12-20 DIAGNOSIS — T148XXA Other injury of unspecified body region, initial encounter: Secondary | ICD-10-CM

## 2010-12-20 DIAGNOSIS — W57XXXA Bitten or stung by nonvenomous insect and other nonvenomous arthropods, initial encounter: Secondary | ICD-10-CM | POA: Insufficient documentation

## 2010-12-20 MED ORDER — EPINEPHRINE 0.3 MG/0.3ML IJ DEVI: 0.3000 mg | Freq: Once | INTRAMUSCULAR | Status: DC

## 2010-12-20 NOTE — Progress Notes (Signed)
Subjective:    Patient ID: Margaret Vazquez, female    DOB: 19-Jun-1936, 75 y.o.   MRN: [id number]  HPI Bitten by a tiny tick - brought it --looks like a deer tick Got it from yard -- ? Working next to a wood pile  Last wed- woke up in the middle of the night -- with intense itching in between her toes   Using abx ointment -- triple abx from [employer]  Used alcohol Liquid benadryl  L foot between first and 2nd toes- blister and not much redness No target No rash No fever/ st/ joint pain etc No rash but there is a blister there now  Patient Active Problem List  Diagnoses  . DIABETES MELLITUS, TYPE II  . VITAMIN B12 DEFICIENCY  . HYPERLIPIDEMIA  . UNSPECIFIED ANEMIA  . HYPERTENSION, ESSENTIAL NOS  . ALLERGIC RHINITIS  . GERD  . OVERACTIVE BLADDER  . BACK PAIN, LUMBAR  . SYNCOPE  . INCONTINENCE, URGE  . SKIN CANCER, HX OF  . PERSONAL HX COLONIC POLYPS  . Tick bite   Past Medical History  Diagnosis Date  . Hypertension   . Diabetes mellitus     type II  . Hyperlipidemia     myalgias with Lipitor and Zetia  . GERD (gastroesophageal reflux disease)   . Skin cancer     hx of basal cell/ak's  . Colon polyps 2009   Past Surgical History  Procedure Date  . Appendectomy   . Tubal ligation     BTL  . Breast surgery     breast biopsy  . Eye surgery 2005    tear duct surgery  . Nasal sinus surgery 01/2005   History  Substance Use Topics  . Smoking status: Never Smoker   . Smokeless tobacco: Not on file  . Alcohol Use:    Family History  Problem Relation Age of Onset  . Cancer Brother     lung cancer smoker   Allergies  Allergen Reactions  . Aspirin     REACTION: Hives  . Atorvastatin     REACTION: joint pain/swelling, inc liver tests  . Bee Venom   . Clopidogrel Bisulfate     REACTION: ?  . Codeine     REACTION: vomiting  . Ezetimibe     REACTION: fatigue  . WUJ:[id number]+QIONGEXBMW Acid+Aspartame     REACTION: swelling lip/hives  .  Nabumetone     REACTION: anaphalaxis  . Valsartan     REACTION: fatigue   Current Outpatient Prescriptions on File Prior to Visit  Medication Sig Dispense Refill  . b complex vitamins tablet Take 1 tablet by mouth daily.        . cyanocobalamin (,VITAMIN B-12,) 1000 MCG/ML injection Inject 1,000 mcg into the muscle every 30 (thirty) days.        Marland Kitchen glucose blood test strip Check blood sugar twice a day and as needed for diabetes 250.0       . metFORMIN (GLUCOPHAGE) 1000 MG tablet Take 1,000 mg by mouth 2 (two) times daily with a meal.        . olmesartan (BENICAR) 40 MG tablet Take 1 tablet (40 mg total) by mouth daily.  30 tablet  11  . rosuvastatin (CRESTOR) 5 MG tablet Take 5 mg by mouth every other day.        . ticlopidine (TICLID) 250 MG tablet Take 250 mg by mouth daily.             Review  of Systems Review of Systems  Constitutional: Negative for fever, appetite change, fatigue and unexpected weight change.  Eyes: Negative for pain and visual disturbance.  ENT neg for st Respiratory: Negative for cough and shortness of breath.   Cardiovascular: Negative for cp or palpitations.   Gastrointestinal: Negative for nausea, diarrhea and constipation.  Genitourinary: Negative for urgency and frequency.  Skin: Negative for pallor. pr rash - pos for itching at tick bite site MSK neg for joint pain or swelling  Neurological: Negative for weakness, light-headedness, numbness and headaches.  Hematological: Negative for adenopathy. Does not bruise/bleed easily.  Psychiatric/Behavioral: Negative for dysphoric mood. The patient is not nervous/anxious.          Objective:   Physical Exam  Constitutional: She appears well-developed and well-nourished. No distress.       overwt and well appearing   HENT:  Head: Normocephalic and atraumatic.  Eyes: Conjunctivae and EOM are normal. Pupils are equal, round, and reactive to light.  Neck: Normal range of motion. Neck supple.    Cardiovascular: Normal rate, regular rhythm, normal heart sounds and intact distal pulses.   Pulmonary/Chest: Effort normal and breath sounds normal.  Abdominal: Soft. There is no tenderness.  Musculoskeletal: Normal range of motion. She exhibits no edema and no tenderness.  Lymphadenopathy:    She has no cervical adenopathy.  Neurological: She has normal reflexes.  Skin:       Blister 5 mm in between first and 2nd toes with very scant redness from prior tick bite  This was sterilly nicked and drained  No tick parts remain  Cleaned and dressed with bactroban  Psychiatric: She has a normal mood and affect.          Assessment & Plan:

## 2010-12-20 NOTE — Patient Instructions (Signed)
Keep tick bite very clean with antibacterial soap and water  Use triple antibiotic ointment until cleared  Cover loosely  If you develop rash or fever or joint pain or any other symptoms - please call

## 2010-12-20 NOTE — Assessment & Plan Note (Signed)
Between R first and 2nd toes Blister investigated- no remaining tick parts No constitutional symptoms or signs of infection Disc care of site until cleared- see inst Update if new symptoms

## 2010-12-23 ENCOUNTER — Telehealth: Payer: Self-pay | Admitting: *Deleted

## 2010-12-23 ENCOUNTER — Encounter: Payer: Self-pay | Admitting: Cardiology

## 2010-12-23 NOTE — Telephone Encounter (Signed)
Twin pack is just fine

## 2010-12-23 NOTE — Telephone Encounter (Signed)
Pharmacy notified as instructed by telephone. 

## 2010-12-23 NOTE — Telephone Encounter (Signed)
Chris from pharmacy called stating that the epi pen that was sent in of Friday was sent in for quantity of 1, but it only comes in twin pack. Thayer Ohm was asking if it was okay to do the twin pack. Advised him that it was okay.

## 2010-12-27 ENCOUNTER — Emergency Department: Payer: Self-pay | Admitting: Emergency Medicine

## 2011-01-18 ENCOUNTER — Emergency Department: Payer: Self-pay | Admitting: *Deleted

## 2011-01-22 ENCOUNTER — Other Ambulatory Visit: Payer: Self-pay | Admitting: Family Medicine

## 2011-01-22 DIAGNOSIS — Z1231 Encounter for screening mammogram for malignant neoplasm of breast: Secondary | ICD-10-CM

## 2011-01-27 ENCOUNTER — Ambulatory Visit: Payer: Medicare Other | Admitting: Family Medicine

## 2011-02-04 ENCOUNTER — Ambulatory Visit
Admission: RE | Admit: 2011-02-04 | Discharge: 2011-02-04 | Disposition: A | Discharge status: Home / Self Care | Payer: Medicare Other | Source: Ambulatory Visit | Attending: Family Medicine | Admitting: Family Medicine

## 2011-02-04 DIAGNOSIS — Z1231 Encounter for screening mammogram for malignant neoplasm of breast: Secondary | ICD-10-CM

## 2011-02-04 LAB — HM MAMMOGRAPHY: HM Mammogram: NORMAL

## 2011-02-05 ENCOUNTER — Encounter: Payer: Self-pay | Admitting: Family Medicine

## 2011-02-05 ENCOUNTER — Ambulatory Visit (INDEPENDENT_AMBULATORY_CARE_PROVIDER_SITE_OTHER): Payer: Medicare Other | Admitting: Family Medicine

## 2011-02-05 DIAGNOSIS — L509 Urticaria, unspecified: Secondary | ICD-10-CM

## 2011-02-05 NOTE — Progress Notes (Signed)
Subjective:    Patient ID: Margaret Vazquez, female    DOB: 1936/01/25, 75 y.o.   MRN: [id number]  HPI 1 week after last visit  Woke up and felt nauseated - but did not vomit Burned all over - and got whelps   Next time she had used a vaginal otc med for yeast 15 minutes later - got itching on thighs Had to sit in water  Then took 2 benadryl Then hives all over Then throat felt swollen  Went to ER and bp got very high  Watched her and gave 3 shots (incl nausea and benadryl)  Did not need to be intubated   Has an epi pen but does not use it   No new foods   Also getting B12 shots- ? Any chance allergic  Still gets diarrhea easily - but not currently No more vomiting   Patient Active Problem List  Diagnoses  . DIABETES MELLITUS, TYPE II  . VITAMIN B12 DEFICIENCY  . HYPERLIPIDEMIA  . UNSPECIFIED ANEMIA  . HYPERTENSION, ESSENTIAL NOS  . ALLERGIC RHINITIS  . GERD  . OVERACTIVE BLADDER  . BACK PAIN, LUMBAR  . SYNCOPE  . INCONTINENCE, URGE  . SKIN CANCER, HX OF  . PERSONAL HX COLONIC POLYPS  . Tick bite  . Urticaria   Past Medical History  Diagnosis Date  . Hypertension   . Diabetes mellitus     type II  . Hyperlipidemia     myalgias with Lipitor and Zetia  . GERD (gastroesophageal reflux disease)   . Skin cancer     hx of basal cell/ak's  . Colon polyps 2009  . Allergy history, drug     Aspirin  . Cervical stenosis of spine     With neck pain   Past Surgical History  Procedure Date  . Appendectomy   . Tubal ligation     BTL  . Breast surgery     breast biopsy  . Eye surgery 2005    tear duct surgery  . Nasal sinus surgery 01/2005  . Cardiac catheterization 1993    LHC: normal coronaries.   . Stress cardiolite 11/1999    Normal/ negative  . Cystoscopy w/ decannulation 03/2000    Normal  . Shoulder surgery 10/2002    Impingement  . Abd [place] 07/2003    Negative  . Tear duct surgery 2005  . Colonoscopy 12/2007    Adenomatous colon polyps    History  Substance Use Topics  . Smoking status: Never Smoker   . Smokeless tobacco: Not on file  . Alcohol Use: No   Family History  Problem Relation Age of Onset  . Lung cancer Brother   . Colon cancer Neg Hx   . Colon polyps Other    Allergies  Allergen Reactions  . Aspirin     REACTION: Hives  . Atorvastatin     REACTION: joint pain/swelling, inc liver tests  . Bee Venom   . Clopidogrel Bisulfate     REACTION: ?  . Codeine     REACTION: vomiting  . Ezetimibe     REACTION: fatigue  . [id number] Acid+Aspartame     REACTION: swelling lip/hives  . Nabumetone     REACTION: anaphalaxis  . Valsartan     REACTION: fatigue   Current Outpatient Prescriptions on File Prior to Visit  Medication Sig Dispense Refill  . b complex vitamins tablet Take 1 tablet by mouth daily.        . cyanocobalamin (,  VITAMIN B-12,) 1000 MCG/ML injection Inject 1,000 mcg into the muscle every 30 (thirty) days.        Marland Kitchen glucose blood test strip Check blood sugar twice a day and as needed for diabetes 250.0       . metFORMIN (GLUCOPHAGE) 1000 MG tablet Take 1,000 mg by mouth 2 (two) times daily with a meal.        . olmesartan (BENICAR) 40 MG tablet Take 1 tablet (40 mg total) by mouth daily.  30 tablet  11  . Probiotic Product (ALIGN) 4 MG CAPS Take 1 capsule by mouth daily.        . rosuvastatin (CRESTOR) 5 MG tablet Take 5 mg by mouth every other day.        . ticlopidine (TICLID) 250 MG tablet Take 250 mg by mouth 2 (two) times daily.       Marland Kitchen EPINEPHrine (EPIPEN) 0.3 mg/0.3 mL DEVI Inject 0.3 mLs (0.3 mg total) into the muscle once.  1 Device  3       Review of Systems Review of Systems  Constitutional: Negative for fever, appetite change, fatigue and unexpected weight change.  Eyes: Negative for pain and visual disturbance.  Respiratory: Negative for cough and shortness of breath.   Cardiovascular: Negative. For cp or palpitations   Gastrointestinal: Negative  for nausea, diarrhea and constipation.  Genitourinary: Negative for urgency and frequency.  Skin: Negative for pallor. pos for hives intermittently Neurological: Negative for weakness, light-headedness, numbness and headaches.  Hematological: Negative for adenopathy. Does not bruise/bleed easily.  Psychiatric/Behavioral: Negative for dysphoric mood. The patient is not nervous/anxious.          Objective:   Physical Exam  Constitutional: She appears well-developed and well-nourished. No distress.  HENT:  Head: Normocephalic and atraumatic.  Right Ear: External ear normal.  Left Ear: External ear normal.  Nose: Nose normal.  Mouth/Throat: Oropharynx is clear and moist.  Eyes: Conjunctivae and EOM are normal. Pupils are equal, round, and reactive to light. Right eye exhibits no discharge. Left eye exhibits no discharge.  Neck: Normal range of motion. Neck supple. No JVD present. No thyromegaly present.  Cardiovascular: Normal rate, regular rhythm, normal heart sounds and intact distal pulses.   Pulmonary/Chest: Effort normal and breath sounds normal. No respiratory distress. She has no wheezes.       No prolonged exp phase or wheeze No swelling of tongue or mouth  Abdominal: Soft. Bowel sounds are normal. She exhibits no distension. There is no tenderness. There is no rebound.  Musculoskeletal: Normal range of motion. She exhibits no edema and no tenderness.  Lymphadenopathy:    She has no cervical adenopathy.  Neurological: She is alert. She has normal reflexes. No cranial nerve deficit. Coordination normal.  Skin: Skin is warm and dry. No rash noted. No erythema. No pallor.       No urticaria noted today  Psychiatric: She has a normal mood and affect.          Assessment & Plan:

## 2011-02-05 NOTE — Patient Instructions (Signed)
We will do allergy referral at check out  Use your epi pen and benadryl if symptoms re occur and go to ER Will hold off on B12 shots until you are evaluated Take care of yourself

## 2011-02-05 NOTE — Assessment & Plan Note (Signed)
Recurrent and last episode with angioedema that required ER visit and prednisone  ? Exp to monistat/ at another time anesthesia /? If could be food or stress related Has epi pen and rev how to use Ref to allergist

## 2011-02-26 ENCOUNTER — Other Ambulatory Visit (INDEPENDENT_AMBULATORY_CARE_PROVIDER_SITE_OTHER): Payer: Medicare Other

## 2011-02-26 DIAGNOSIS — E119 Type 2 diabetes mellitus without complications: Secondary | ICD-10-CM

## 2011-02-26 LAB — HEMOGLOBIN A1C: Hgb A1c MFr Bld: 6.6 % — ABNORMAL HIGH (ref 4.6–6.5)

## 2011-03-05 ENCOUNTER — Ambulatory Visit (INDEPENDENT_AMBULATORY_CARE_PROVIDER_SITE_OTHER): Payer: Medicare Other | Admitting: Family Medicine

## 2011-03-05 ENCOUNTER — Encounter: Payer: Self-pay | Admitting: Family Medicine

## 2011-03-05 DIAGNOSIS — E119 Type 2 diabetes mellitus without complications: Secondary | ICD-10-CM

## 2011-03-05 NOTE — Patient Instructions (Signed)
Good control of diabetes- keep up the good habits Start some walking or other exercise  No change in medicine  Schedule 30 minute appt for annual exam in 6 months with labs prior

## 2011-03-05 NOTE — Assessment & Plan Note (Signed)
This is improved with better diet and 2 lb wt loss Enc to keep it up  Disc plan for exercise 5 d per week  Lab and f/u for annual exam in 6 months

## 2011-03-05 NOTE — Progress Notes (Signed)
Subjective:    Patient ID: Margaret Vazquez, female    DOB: 08/30/1935, 75 y.o.   MRN: [id number]  HPI  Here for f/u of DM and HTN  bp good at 132/60 No cp or sob or palpitations or ha  a1c is great at 6.6  Is on metformin and arb  Less appetite and eating less Is watching what she eats - no sweets or snacks  Not a lot of exercise -- is ready to start some walking   Went to allergist - found out she was all to animals and dust and mold  Also sensitive to medicines  Goes back in 2 months - to check for venom allergies    Lost a family member Sister is in hospice  Less responsibility now - so a little less stress   Patient Active Problem List  Diagnoses  . DIABETES MELLITUS, TYPE II  . VITAMIN B12 DEFICIENCY  . HYPERLIPIDEMIA  . UNSPECIFIED ANEMIA  . HYPERTENSION, ESSENTIAL NOS  . ALLERGIC RHINITIS  . GERD  . OVERACTIVE BLADDER  . BACK PAIN, LUMBAR  . SYNCOPE  . INCONTINENCE, URGE  . SKIN CANCER, HX OF  . PERSONAL HX COLONIC POLYPS  . Tick bite  . Urticaria   Past Medical History  Diagnosis Date  . Hypertension   . Diabetes mellitus     type II  . Hyperlipidemia     myalgias with Lipitor and Zetia  . GERD (gastroesophageal reflux disease)   . Skin cancer     hx of basal cell/ak's  . Colon polyps 2009  . Allergy history, drug     Aspirin  . Cervical stenosis of spine     With neck pain   Past Surgical History  Procedure Date  . Appendectomy   . Tubal ligation     BTL  . Breast surgery     breast biopsy  . Eye surgery 2005    tear duct surgery  . Nasal sinus surgery 01/2005  . Cardiac catheterization 1993    LHC: normal coronaries.   . Stress cardiolite 11/1999    Normal/ negative  . Cystoscopy w/ decannulation 03/2000    Normal  . Shoulder surgery 10/2002    Impingement  . Abd [place] 07/2003    Negative  . Tear duct surgery 2005  . Colonoscopy 12/2007    Adenomatous colon polyps   History  Substance Use Topics  . Smoking status: Never  Smoker   . Smokeless tobacco: Not on file  . Alcohol Use: No   Family History  Problem Relation Age of Onset  . Lung cancer Brother   . Colon cancer Neg Hx   . Colon polyps Other    Allergies  Allergen Reactions  . Aspirin     REACTION: Hives  . Atorvastatin     REACTION: joint pain/swelling, inc liver tests  . Bee Venom   . Clopidogrel Bisulfate     REACTION: ?  . Codeine     REACTION: vomiting  . Ezetimibe     REACTION: fatigue  . WGN:[id number] Acid+Aspartame     REACTION: swelling lip/hives  . Nabumetone     REACTION: anaphalaxis  . Valsartan     REACTION: fatigue   Current Outpatient Prescriptions on File Prior to Visit  Medication Sig Dispense Refill  . glucose blood test strip Check blood sugar twice a day and as needed for diabetes 250.0       . metFORMIN (GLUCOPHAGE) 1000 MG tablet Take  1,000 mg by mouth 2 (two) times daily with a meal.        . olmesartan (BENICAR) 40 MG tablet Take 1 tablet (40 mg total) by mouth daily.  30 tablet  11  . Probiotic Product (ALIGN) 4 MG CAPS Take 1 capsule by mouth daily.        . rosuvastatin (CRESTOR) 5 MG tablet Take 5 mg by mouth every other day.        . ticlopidine (TICLID) 250 MG tablet Take 250 mg by mouth 2 (two) times daily.       Marland Kitchen b complex vitamins tablet Take 1 tablet by mouth daily.        . cyanocobalamin (,VITAMIN B-12,) 1000 MCG/ML injection Inject 1,000 mcg into the muscle every 30 (thirty) days.        Marland Kitchen EPINEPHrine (EPIPEN) 0.3 mg/0.3 mL DEVI Inject 0.3 mLs (0.3 mg total) into the muscle once.  1 Device  3    Review of Systems Review of Systems  Constitutional: Negative for fever, appetite change, fatigue and unexpected weight change.  Eyes: Negative for pain and visual disturbance.  Respiratory: Negative for cough and shortness of breath.   Cardiovascular: Negative.  for cp or palpitations Gastrointestinal: Negative for nausea, diarrhea and constipation.  Genitourinary: Negative for  urgency and frequency.  Skin: Negative for pallor. or rash  Neurological: Negative for weakness, light-headedness, numbness and headaches.  Hematological: Negative for adenopathy. Does not bruise/bleed easily.  Psychiatric/Behavioral: Negative for dysphoric mood. The patient is not nervous/anxious.          Objective:   Physical Exam  Constitutional: She appears well-developed and well-nourished. No distress.  HENT:  Head: Normocephalic and atraumatic.  Mouth/Throat: Oropharynx is clear and moist.  Eyes: Conjunctivae and EOM are normal. Pupils are equal, round, and reactive to light.  Neck: Normal range of motion. Neck supple. No JVD present. Carotid bruit is not present. No thyromegaly present.  Cardiovascular: Normal rate, regular rhythm, normal heart sounds and intact distal pulses.   Pulmonary/Chest: Effort normal and breath sounds normal. No respiratory distress. She has no wheezes.  Abdominal: Soft. Bowel sounds are normal. She exhibits no distension. There is no tenderness.  Musculoskeletal: Normal range of motion. She exhibits no edema.  Lymphadenopathy:    She has no cervical adenopathy.  Neurological: She is alert. She has normal reflexes. Coordination normal.  Skin: Skin is warm and dry. No rash noted. No erythema. No pallor.  Psychiatric: She has a normal mood and affect.          Assessment & Plan:

## 2011-03-14 ENCOUNTER — Ambulatory Visit: Payer: Self-pay | Admitting: Family Medicine

## 2011-03-14 ENCOUNTER — Ambulatory Visit (INDEPENDENT_AMBULATORY_CARE_PROVIDER_SITE_OTHER): Payer: Medicare Other | Admitting: Family Medicine

## 2011-03-14 ENCOUNTER — Encounter: Payer: Self-pay | Admitting: Family Medicine

## 2011-03-14 VITALS — BP 138/70 | HR 72 | Temp 98.6°F | Wt 201.2 lb

## 2011-03-14 DIAGNOSIS — M79609 Pain in unspecified limb: Secondary | ICD-10-CM

## 2011-03-14 DIAGNOSIS — M79662 Pain in left lower leg: Secondary | ICD-10-CM

## 2011-03-14 DIAGNOSIS — M79661 Pain in right lower leg: Secondary | ICD-10-CM

## 2011-03-14 DIAGNOSIS — M79605 Pain in left leg: Secondary | ICD-10-CM

## 2011-03-14 DIAGNOSIS — M79604 Pain in right leg: Secondary | ICD-10-CM | POA: Insufficient documentation

## 2011-03-14 NOTE — Assessment & Plan Note (Signed)
R>L pain, no fullness in calves. fmhx blood clot, cannot rule out today.  Will refer for dopplers to [facility].  ==> received preliminary report, no DVT. On statin (Crestor) - will check CK. May consider trial off statin to see if any improvement.  May consider trial of NSAIDs vs tylenol. No claudication sxs, no cramping.

## 2011-03-14 NOTE — Patient Instructions (Signed)
Blood work today to check muscle function. Pass by Marion's office to set up leg ultrasounds, today if possible to rule out blood clot, if not then early next week. In meantime if any chest pain or tightness or shortness of breath, go to ER.

## 2011-03-14 NOTE — Progress Notes (Signed)
  Subjective:    Patient ID: Margaret Vazquez, female    DOB: 08-28-1935, 75 y.o.   MRN: [id number]  HPI CC: left leg pain  Pressure behind legs R>L, going on for 1 month.  On vacation last week, had trouble sleeping at night 2/2 pain.  Stretching legs improved pain.  Bending legs and crossing legs makes pain worse.  Hasnt tried any tylenol or ibuprofen.  Pain sometimes travels up into back of thighs.  Sometimes notes lower back pain when sitting on hard surface for long time.  No claudication sxs, comes on at rest.  [place] to [place], 2 hours at a time.  Not prolonged drive.  Had pressure feeling even before trip.  No redness or swelling of legs.  No fevers/chills.  No SOB, dizziness, CP/tightness.  Not like cramps, more pressure feeling, discomfort.  No hormonal meds.  No h/o blood clots.  + Sister with blood clots in past.  Review of Systems Per HPI    Objective:   Physical Exam  Nursing note and vitals reviewed. Constitutional: She appears well-developed and well-nourished. No distress.  Musculoskeletal:       Right hip: Normal.       Left hip: Normal.       Right knee: Normal.       Left knee: Normal.       Lumbar back: Normal. She exhibits normal range of motion and no tenderness.       No midline or paraspinous mm pain lumbar region  Bilateral calves somewhat tneder to palpation.  No fullness behind knees.  No palpable cords.  2+ DP bilaterally.  Skin: No ecchymosis noted. No erythema.      Assessment & Plan:

## 2011-03-15 LAB — CK: Total CK: 53 U/L (ref 7–177)

## 2011-03-17 ENCOUNTER — Emergency Department: Payer: Self-pay | Admitting: Emergency Medicine

## 2011-03-17 ENCOUNTER — Encounter: Payer: Self-pay | Admitting: Family Medicine

## 2011-03-18 ENCOUNTER — Telehealth: Payer: Self-pay | Admitting: Family Medicine

## 2011-03-18 NOTE — Telephone Encounter (Signed)
Patient notified as instructed by telephone. Pt said she will wait an see allergist regular time this month and if she has more problems will let Dr Milinda Antis know.

## 2011-03-18 NOTE — Telephone Encounter (Signed)
Pt wanted you to know that she had to go back to [facility] ER last night with another case of hives, probable allergic reaction.  She wishes she knew what is causing these episodes.

## 2011-03-18 NOTE — Telephone Encounter (Signed)
Please encourage her to let her allergist know -- ? If they may want to move up her next appt or not  Thanks for the heads up

## 2011-04-07 ENCOUNTER — Emergency Department (HOSPITAL_COMMUNITY): Payer: Medicare Other

## 2011-04-07 ENCOUNTER — Inpatient Hospital Stay (HOSPITAL_COMMUNITY): Payer: Medicare Other

## 2011-04-07 ENCOUNTER — Telehealth: Payer: Self-pay | Admitting: *Deleted

## 2011-04-07 ENCOUNTER — Inpatient Hospital Stay (HOSPITAL_COMMUNITY)
Admission: EM | Admit: 2011-04-07 | Discharge: 2011-04-09 | DRG: 287 | Disposition: A | Discharge status: Home / Self Care | Payer: Medicare Other | Source: Ambulatory Visit | Attending: Internal Medicine | Admitting: Internal Medicine

## 2011-04-07 DIAGNOSIS — E785 Hyperlipidemia, unspecified: Secondary | ICD-10-CM | POA: Diagnosis present

## 2011-04-07 DIAGNOSIS — R079 Chest pain, unspecified: Secondary | ICD-10-CM

## 2011-04-07 DIAGNOSIS — I10 Essential (primary) hypertension: Principal | ICD-10-CM | POA: Diagnosis present

## 2011-04-07 DIAGNOSIS — R0789 Other chest pain: Secondary | ICD-10-CM | POA: Diagnosis present

## 2011-04-07 DIAGNOSIS — R51 Headache: Secondary | ICD-10-CM | POA: Diagnosis present

## 2011-04-07 DIAGNOSIS — E119 Type 2 diabetes mellitus without complications: Secondary | ICD-10-CM | POA: Diagnosis present

## 2011-04-07 HISTORY — PX: CARDIAC CATHETERIZATION: SHX172

## 2011-04-07 LAB — URINALYSIS, ROUTINE W REFLEX MICROSCOPIC
Bilirubin Urine: NEGATIVE
Glucose, UA: NEGATIVE mg/dL
Hgb urine dipstick: NEGATIVE
Ketones, ur: NEGATIVE mg/dL
Leukocytes, UA: NEGATIVE
Nitrite: NEGATIVE
Protein, ur: NEGATIVE mg/dL
Specific Gravity, Urine: 1.014 (ref 1.005–1.030)
Urobilinogen, UA: 0.2 mg/dL (ref 0.0–1.0)
pH: 6 (ref 5.0–8.0)

## 2011-04-07 LAB — DIFFERENTIAL
Basophils Absolute: 0 10*3/uL (ref 0.0–0.1)
Basophils Relative: 1 % (ref 0–1)
Eosinophils Absolute: 0.1 10*3/uL (ref 0.0–0.7)
Eosinophils Relative: 2 % (ref 0–5)
Lymphocytes Relative: 17 % (ref 12–46)
Lymphs Abs: 1.3 10*3/uL (ref 0.7–4.0)
Monocytes Absolute: 0.4 10*3/uL (ref 0.1–1.0)
Monocytes Relative: 5 % (ref 3–12)
Neutro Abs: 6 10*3/uL (ref 1.7–7.7)
Neutrophils Relative %: 76 % (ref 43–77)

## 2011-04-07 LAB — PRO B NATRIURETIC PEPTIDE: Pro B Natriuretic peptide (BNP): 646.7 pg/mL — ABNORMAL HIGH (ref 0–125)

## 2011-04-07 LAB — BASIC METABOLIC PANEL
BUN: 11 mg/dL (ref 6–23)
CO2: 26 mEq/L (ref 19–32)
Calcium: 9.4 mg/dL (ref 8.4–10.5)
Chloride: 103 mEq/L (ref 96–112)
Creatinine, Ser: 0.69 mg/dL (ref 0.50–1.10)
GFR calc Af Amer: >90 mL/min (ref >90)
GFR calc non Af Amer: 84 mL/min — ABNORMAL LOW (ref >90)
Glucose, Bld: 103 mg/dL — ABNORMAL HIGH (ref 70–99)
Potassium: 3.9 mEq/L (ref 3.5–5.1)
Sodium: 139 mEq/L (ref 135–145)

## 2011-04-07 LAB — CBC
HCT: 38.8 % (ref 36.0–46.0)
Hemoglobin: 13.3 g/dL (ref 12.0–15.0)
MCH: 31.4 pg (ref 26.0–34.0)
MCHC: 34.3 g/dL (ref 30.0–36.0)
MCV: 91.7 fL (ref 78.0–100.0)
Platelets: 235 10*3/uL (ref 150–400)
RBC: 4.23 MIL/uL (ref 3.87–5.11)
RDW: 12.7 % (ref 11.5–15.5)
WBC: 7.9 10*3/uL (ref 4.0–10.5)

## 2011-04-07 LAB — MRSA PCR SCREENING: MRSA by PCR: NEGATIVE

## 2011-04-07 LAB — CARDIAC PANEL(CRET KIN+CKTOT+MB+TROPI)
CK, MB: 3.8 ng/mL (ref 0.3–4.0)
Relative Index: INVALID (ref 0.0–2.5)
Total CK: 48 U/L (ref 7–177)
Troponin I: 0.72 ng/mL (ref <0.30)

## 2011-04-07 LAB — GLUCOSE, CAPILLARY
Glucose-Capillary: 112 mg/dL — ABNORMAL HIGH (ref 70–99)
Glucose-Capillary: 108 mg/dL — ABNORMAL HIGH (ref 70–99)

## 2011-04-07 LAB — POCT I-STAT TROPONIN I: Troponin i, poc: 0.02 ng/mL (ref 0.00–0.08)

## 2011-04-07 LAB — D-DIMER, QUANTITATIVE: D-Dimer, Quant: 0.39 ug/mL-FEU (ref 0.00–0.48)

## 2011-04-07 LAB — PROTIME-INR
INR: 0.97 (ref 0.00–1.49)
Prothrombin Time: 13.1 seconds (ref 11.6–15.2)

## 2011-04-07 MED ORDER — IOHEXOL 300 MG/ML  SOLN
100.0000 mL | Freq: Once | INTRAMUSCULAR | Status: AC | PRN
  Administered 2011-04-07: 100 mL via INTRAVENOUS

## 2011-04-07 NOTE — Telephone Encounter (Signed)
Pt called back from fire department, fireman there wanted to talk to me.  Pt's BP was 220/100, 10 minutes later it was 192/80.  He said pt was in obvious pain with her headache, then she started complaining of chest pain.  Encarnacion Slates said he was going to have EMS take pt to ER.

## 2011-04-07 NOTE — Telephone Encounter (Signed)
Pt called to report that she has a bad pain behind her eyes and her BP is elevated.  Her machine read 202/98.  She has taken tylenol for her headache but that hasnt helped.  She has taken her medicine this morning.   She is going to a fire station close to her house to have them check her blood pressure and she will call back.

## 2011-04-07 NOTE — Telephone Encounter (Signed)
Thank you - I agree with advisement to go to ER

## 2011-04-08 DIAGNOSIS — I517 Cardiomegaly: Secondary | ICD-10-CM

## 2011-04-08 DIAGNOSIS — I2510 Atherosclerotic heart disease of native coronary artery without angina pectoris: Secondary | ICD-10-CM

## 2011-04-08 HISTORY — DX: Atherosclerotic heart disease of native coronary artery without angina pectoris: I25.10

## 2011-04-08 LAB — GLUCOSE, CAPILLARY
Glucose-Capillary: 108 mg/dL — ABNORMAL HIGH (ref 70–99)
Glucose-Capillary: 98 mg/dL (ref 70–99)
Glucose-Capillary: 104 mg/dL — ABNORMAL HIGH (ref 70–99)
Glucose-Capillary: 98 mg/dL (ref 70–99)

## 2011-04-08 LAB — CARDIAC PANEL(CRET KIN+CKTOT+MB+TROPI)
CK, MB: 4 ng/mL (ref 0.3–4.0)
CK, MB: 3.9 ng/mL (ref 0.3–4.0)
Relative Index: INVALID (ref 0.0–2.5)
Relative Index: INVALID (ref 0.0–2.5)
Total CK: 50 U/L (ref 7–177)
Total CK: 47 U/L (ref 7–177)
Troponin I: 0.64 ng/mL (ref <0.30)
Troponin I: 0.4 ng/mL (ref <0.30)

## 2011-04-08 LAB — BASIC METABOLIC PANEL
BUN: 9 mg/dL (ref 6–23)
CO2: 29 mEq/L (ref 19–32)
Calcium: 8.9 mg/dL (ref 8.4–10.5)
Chloride: 107 mEq/L (ref 96–112)
Creatinine, Ser: 0.81 mg/dL (ref 0.50–1.10)
GFR calc Af Amer: 82 mL/min — ABNORMAL LOW (ref >90)
GFR calc non Af Amer: 70 mL/min — ABNORMAL LOW (ref >90)
Glucose, Bld: 101 mg/dL — ABNORMAL HIGH (ref 70–99)
Potassium: 4 mEq/L (ref 3.5–5.1)
Sodium: 141 mEq/L (ref 135–145)

## 2011-04-08 LAB — CBC
HCT: 34.8 % — ABNORMAL LOW (ref 36.0–46.0)
Hemoglobin: 11.9 g/dL — ABNORMAL LOW (ref 12.0–15.0)
MCH: 31.6 pg (ref 26.0–34.0)
MCHC: 34.2 g/dL (ref 30.0–36.0)
MCV: 92.3 fL (ref 78.0–100.0)
Platelets: 205 10*3/uL (ref 150–400)
RBC: 3.77 MIL/uL — ABNORMAL LOW (ref 3.87–5.11)
RDW: 12.7 % (ref 11.5–15.5)
WBC: 6 10*3/uL (ref 4.0–10.5)

## 2011-04-08 LAB — LIPID PANEL
Cholesterol: 207 mg/dL — ABNORMAL HIGH (ref 0–200)
HDL: 46 mg/dL (ref >39)
LDL Cholesterol: 127 mg/dL — ABNORMAL HIGH (ref 0–99)
Total CHOL/HDL Ratio: 4.5 RATIO
Triglycerides: 171 mg/dL — ABNORMAL HIGH (ref <150)
VLDL: 34 mg/dL (ref 0–40)

## 2011-04-08 LAB — HEPARIN LEVEL (UNFRACTIONATED): Heparin Unfractionated: 0.46 IU/mL (ref 0.30–0.70)

## 2011-04-08 LAB — HEMOGLOBIN A1C
Hgb A1c MFr Bld: 6.8 % — ABNORMAL HIGH (ref <5.7)
Mean Plasma Glucose: 148 mg/dL — ABNORMAL HIGH (ref <117)

## 2011-04-09 ENCOUNTER — Inpatient Hospital Stay (INDEPENDENT_AMBULATORY_CARE_PROVIDER_SITE_OTHER)
Admission: RE | Admit: 2011-04-09 | Discharge: 2011-04-09 | Disposition: A | Discharge status: Home / Self Care | Payer: Medicare Other | Source: Ambulatory Visit | Attending: Family Medicine | Admitting: Family Medicine

## 2011-04-09 ENCOUNTER — Emergency Department (HOSPITAL_COMMUNITY): Payer: Medicare Other

## 2011-04-09 ENCOUNTER — Emergency Department (HOSPITAL_COMMUNITY)
Admission: EM | Admit: 2011-04-09 | Discharge: 2011-04-09 | Disposition: A | Discharge status: Home / Self Care | Payer: Medicare Other | Attending: Emergency Medicine | Admitting: Emergency Medicine

## 2011-04-09 DIAGNOSIS — G43909 Migraine, unspecified, not intractable, without status migrainosus: Secondary | ICD-10-CM | POA: Insufficient documentation

## 2011-04-09 DIAGNOSIS — R42 Dizziness and giddiness: Secondary | ICD-10-CM

## 2011-04-09 DIAGNOSIS — R112 Nausea with vomiting, unspecified: Secondary | ICD-10-CM | POA: Insufficient documentation

## 2011-04-09 DIAGNOSIS — I10 Essential (primary) hypertension: Secondary | ICD-10-CM | POA: Insufficient documentation

## 2011-04-09 DIAGNOSIS — E119 Type 2 diabetes mellitus without complications: Secondary | ICD-10-CM | POA: Insufficient documentation

## 2011-04-09 DIAGNOSIS — I252 Old myocardial infarction: Secondary | ICD-10-CM | POA: Insufficient documentation

## 2011-04-09 DIAGNOSIS — Z79899 Other long term (current) drug therapy: Secondary | ICD-10-CM | POA: Insufficient documentation

## 2011-04-09 LAB — CBC
HCT: 35.6 % — ABNORMAL LOW (ref 36.0–46.0)
Hemoglobin: 12 g/dL (ref 12.0–15.0)
MCH: 30.8 pg (ref 26.0–34.0)
MCHC: 33.7 g/dL (ref 30.0–36.0)
MCV: 91.3 fL (ref 78.0–100.0)
Platelets: 208 10*3/uL (ref 150–400)
RBC: 3.9 MIL/uL (ref 3.87–5.11)
RDW: 12.6 % (ref 11.5–15.5)
WBC: 6.6 10*3/uL (ref 4.0–10.5)

## 2011-04-09 LAB — BASIC METABOLIC PANEL WITH GFR
BUN: 10 mg/dL (ref 6–23)
CO2: 31 meq/L (ref 19–32)
Calcium: 9 mg/dL (ref 8.4–10.5)
Chloride: 103 meq/L (ref 96–112)
Creatinine, Ser: 0.82 mg/dL (ref 0.50–1.10)
GFR calc Af Amer: 80 mL/min — ABNORMAL LOW
GFR calc non Af Amer: 69 mL/min — ABNORMAL LOW
Glucose, Bld: 95 mg/dL (ref 70–99)
Potassium: 4.1 meq/L (ref 3.5–5.1)
Sodium: 141 meq/L (ref 135–145)

## 2011-04-09 LAB — URINE CULTURE
Colony Count: 85000
Culture  Setup Time: 201210021355

## 2011-04-09 LAB — GLUCOSE, CAPILLARY
Glucose-Capillary: 144 mg/dL — ABNORMAL HIGH (ref 70–99)
Glucose-Capillary: 113 mg/dL — ABNORMAL HIGH (ref 70–99)
Glucose-Capillary: 99 mg/dL (ref 70–99)
Glucose-Capillary: 138 mg/dL — ABNORMAL HIGH (ref 70–99)

## 2011-04-09 NOTE — Progress Notes (Signed)
NAME:  ADRYAN, MARSHMAN NO.:  [id number]  MEDICAL RECORD NO.:  [id number]  LOCATION:  2917                         FACILITY:  [facility]  PHYSICIAN:  Cherene Altes, M.D.DATE OF BIRTH:  [birth year]                                PROGRESS NOTE   SUBJECTIVE: Ms. Cristiano was discharged from [facility] earlier today. She was evaluated prior to leaving the hospital and had no complaints whatsoever.  Her blood pressure was controlled and her physical exam was unremarkable.  The patient has also been cleared for discharge by the consulting cardiologist.  The patient received all of her instructions and reported that she understood them well.  She then was discharged from the hospital.  After returning home, a call was received at the [facility] office which was ultimately routed to Dr. Thereasa Solo. Dr. Thereasa Solo returned the call to the patient's home number and spoke with Ms. Mcmann directly.  Ms. Transou reported that approximately 45 minutes after arriving home, she developed the acute onset of a bitemporal headache.  This was associated with "blurry vision" in both eyes.  There was no complete loss of vision.  There was no unilateral visual loss.  There was no tingling or chest pain associated with this. There was no shortness of breath.  There was no focal weakness, and in fact, the patient denied weakness at all.  She checked her blood pressure using a meter that she has at home and it was found to be 132/56.  She had no other complaints.  She has not had previous symptoms similar to this.  She does report she has a prior history of migraine headaches, but she has not had any quite some time.  She denied difficulty speaking and in fact sounded clear over the phone.  She denied difficulty swallowing or other abnormalities.  She did report to me that her husband was there with her at the home.  I advised the patient that it was difficult for me to give her  much advice over the phone.  I explained to her that the safest approach would be for her to present herself to an urgent care center or the emergency room to be evaluated by a physician.  She seemed to reluctant to do so.  I have voiced to her that I certainly understood that she would not wish to come back to the hospital, but I did again inform her that I felt the best course of action would be to examine to assure that no acute issues had developed.  Alternatively, I explained her that if she chose not to pursue treatment that she could certainly take an over-the-counter headache remedy and have her husband watch her closely.  I did again, however, encouraged her that the best case scenario would be for her to be examined to assure that no acute issues had occurred.  At the time that we finished her conversation, she was deciding what to do.     Cherene Altes, M.D.     JTM/MEDQ  D:  04/09/2011  T:  04/09/2011  Job:  [id number]  Electronically Signed by Joette Catching M.D. on 04/09/2011 05:37:14 PM

## 2011-04-09 NOTE — Cardiovascular Report (Signed)
  NAMEBRYNDA, Margaret Vazquez NO.:  [id number]  MEDICAL RECORD NO.:  [id number]  LOCATION:  2917                         FACILITY:  [facility]  PHYSICIAN:  Veverly Fells. Excell Seltzer, MD  DATE OF BIRTH:  1935/12/16  DATE OF PROCEDURE:  04/08/2011 DATE OF DISCHARGE:                           CARDIAC CATHETERIZATION   PROCEDURE:  Selective coronary angiography.  PROCEDURAL INDICATIONS:  Margaret Vazquez is a 75 year old woman who presented with chest pain in the setting of malignant hypertension.  She ruled in for myocardial infarction with a positive troponin.  The patient was referred for cardiac catheterization.  Risks and indications of the procedure were reviewed with the patient. Informed consent was obtained.  The right wrist was prepped, draped and anesthetized with 1% lidocaine using modified Seldinger technique.  A 5- French sheath was placed in the right radial artery, 3 mg of verapamil was given through the sheath, 4000 units of heparin was given intravenously.  Standard Judkins catheters were used for coronary angiography.  There was a good bit of subclavian tortuosity, and I was unable to torque the right coronary catheters.  I tried a JR-4 and a no torque right catheter without success.  I was able to cannulate the right coronary artery with an AR-1 catheter.  The procedure was well tolerated and there were no immediate complications.  A TR band was used for radial hemostasis.  PROCEDURAL FINDINGS:  Aortic pressure is 157/83 with a mean of 115.  CORONARY ANGIOGRAPHY:  The left main is patent.  There is no obstructive disease.  It divides into the LAD and left circumflex.  LAD:  The LAD has mild nonobstructive stenosis throughout the proximal vessel.  There is 20-30% luminal narrowing.  The vessel is essentially widely patent and gives off a tiny first diagonal and a moderate caliber second diagonal branch.  LEFT CIRCUMFLEX:  There is a small to moderate caliber  intermediate branch with no significant stenosis.  The AV groove circumflex has a 50% mid stenosis.  It supplies a single OM branch.  RIGHT CORONARY ARTERY:  This is a large, dominant vessel.  There is no obstructive disease throughout the proximal mid or distal RCA.  There are luminal irregularities with up to 30% stenosis.  The vessel gives off a large PDA branch and 3 posterolateral branches.  There is mild distal vessel disease noted, but there are no areas of critical stenosis anywhere.  FINAL ASSESSMENT:  Mild diffuse nonobstructive coronary artery disease as described.  RECOMMENDATIONS:  The patient should be treated with medical therapy with focus on control of her blood pressure.  She is allergic to both aspirin and Plavix and antiplatelet therapy is probably not necessary.     Veverly Fells. Excell Seltzer, MD     MDC/MEDQ  D:  04/08/2011  T:  04/09/2011  Job:  [id number]  cc:   Marne A. Milinda Antis, MD  Electronically Signed by Tonny Bollman MD on 04/09/2011 10:35:46 PM

## 2011-04-09 NOTE — Discharge Summary (Signed)
Margaret Vazquez, Margaret Vazquez NO.:  [id number]  MEDICAL RECORD NO.:  [id number]  LOCATION:  2917                         FACILITY:  [facility]  PHYSICIAN:  Lonia Blood, M.D.DATE OF BIRTH:  01/27/1936  DATE OF ADMISSION:  04/07/2011 DATE OF DISCHARGE:  04/09/2011                              DISCHARGE SUMMARY   DISCHARGING PHYSICIAN:  Lonia Blood, MD.  PRIMARY CARE PHYSICIAN:  Marne A. Tower, MD.  CONSULTANTS THIS ADMISSION:  Dr. Lewayne Bunting with Cardiology.  PROCEDURES THIS ADMISSION: 1. Left heart cardiac catheterization by Dr. Tonny Bollman, findings     of mild diffuse nonobstructive coronary artery disease involving     the LAD 20-30% luminal narrowing, the left circumflex with the AV     groove circumflex with a 50% mid stenosis and the RCA with luminal     irregularities of the 30% stenosis proximally.  Recommendations     from Dr. Excell Seltzer are for the patient to be treated with medical     therapy with focus on control of her blood pressure.  She is     allergic to both ASPIRIN and PLAVIX and antiplatelet therapy is     probably not necessary.  CHIEF COMPLAINT AND REASON FOR ADMISSION:  Margaret Vazquez is a pleasant 75- year-old female patient with known history of diabetes (on metformin), hypertension, dyslipidemia, also has multiple drug allergies.  She presented to the emergency department after having symptomatic accelerated hypertension.  This was associated with headaches and  left eye pain as well as subsequent chest pain.  She initially sought care at a local fire station when she was told that her primary care physician's office was backed up and she could not be seen for at least an hour.  Her initial blood pressure reading at the fire department was 200/118.  After arrival to the fire department, the patient then began having chest pain.  She was transported via EMS to the emergency department and her initial systolic blood pressure was 179.   In the emergency department, she was initially treated with three sublingual nitroglycerin and subsequently was started on a nitroglycerin drip. This was also started because of her complaints of chest pain.  By the time the [facility] evaluated the patient, her blood pressure was now down to 167/63, and she was not complaining of any further chest discomfort.  Additional vital signs upon initial evaluation was follows:  Temperature 98.1, pulse 69, respirations 16, O2 saturation is 98% on room air.  Physical exam was performed and was otherwise unremarkable.  ADMITTING LABORATORY DATA:  Urinalysis negative.  Pro BNP 646.  PT 13, INR 0.9.  Sodium 139, potassium 3.9, chloride 103, bicarb 26, glucose 103, BUN 11, creatinine 0.69, troponin 0.02.  White blood cell count 7900, hemoglobin 13.3, hematocrit 38.8, platelets 235,000/  ADMITTING DIAGNOSTICS:  Chest x-ray showed no acute disease.  CT of the head without contrast showed no acute intracranial abnormalities. Initial EKG showed sinus rhythm without any acute ischemic ST-T wave changes.  PAST MEDICAL HISTORY: 1. Diabetes type 2, on metformin. 2. Hypertension, on Benicar prior to admission. 3. Dyslipidemia, currently not on medications.  ADMITTING DIAGNOSES:  1. Malignant hypertension, symptomatic. 2. Chest pain in patient with multiple risk factors, rule out acute     coronary syndrome. 3. Headache and left eye pain. 4. Diabetes, on metformin.  DIAGNOSTICS SINCE ADMISSION:  CT angiogram of the chest performed on October 1st shows no evidence of thoracic aortic dissection.  LABORATORY DATA SINCE ADMISSION:  Cardiac isoenzymes were cycled with a peak troponin of 0.72, last troponin 0.4 on October 2nd.  CPK has remained normal.  Relative index slightly elevated.  Total cholesterol 207, triglycerides 171, HDL cholesterol 46, LDL cholesterol 127.  Urine culture is still pending at time of dictation, although  initial urinalysis was negative and is inconsistent with an urinary tract infection.  Hemoglobin A1c 6.8 with associated plasma glucose 148.  D- dimer was normal at 0.39.  HOSPITAL COURSE: 1. Symptomatic accelerated hypertension.  After presentation to the     hospital and initiation of IV nitroglycerin drip, the patient's     blood pressure was rapidly controlled.  She has not had any further     episodes of uncontrolled hypertension since admission and plans are     to resume her usual home medications in addition to medications     that will be used to treat new diagnosis of nonobstructive coronary     disease. 2. Chest pain, rule out acute coronary syndrome.  The patient was     having chest discomfort which essentially resolved after she was     treated with IV nitroglycerin and blood pressure was controlled.     She did have a mild bump in her troponin to 0.79 without any EKG     changes.  Cardiology was consulted and the patient subsequently     underwent left heart catheterization on April 08, 2011 which     revealed nonobstructive coronary artery disease as previously     described.  Again, likely her chest pain was due to her elevated     hypertension as well as the elevated troponin due to the same.     Cardiologist Dr. Ladona Ridgel has recommended to start Coreg on this     patient (low-dose), and continue her usual home medications and uptitrate     the Coreg as an outpatient.  She is to follow up with Dr. Milinda Antis her     primary care physician in regards to management of this problem. 3. Diabetes type 2.  The patient's metformin was held prior to     admission per our routine, glucoses have been well controlled on     sliding scale insulin.  Because she did undergo cardiac     catheterization, she will need to hold her metformin until April 10, 2011. 4. Dyslipidemia.  The patient did carry this diagnosis prior to     admission and was not on medication, although her lipid  panel was     not optimal.  Defer to Dr. Milinda Antis as to whether initiation of     dyslipidemic agents are indicated.  The patient does have multiple     drug allergies including CRESTOR, LIPITOR and ZETIA which obviously     makes pharmacological management of dyslipidemia difficult.  FINAL DISCHARGE DIAGNOSES: 1. Accelerated hypertension, now controlled. 2. Chest pain secondary to accelerated hypertension. 3. New diagnosis of nonobstructive coronary artery disease per cardiac     catheterization this hospitalization. 4. Normal systolic function with mild left ventricular hypertrophy,     ejection fraction 60% per echocardiogram  this admission. 5. Diabetes type 2, on metformin. 6. Dyslipidemia with multiple STATIN allergies.  DISCHARGE MEDICATIONS: 1. Coreg 3.125 mg tablets b.i.d. initiated this hospitalization. 2. Benicar 40 mg daily. 3. Metformin 500 mg b.i.d. with special instructions to resume this     medication on April 10, 2011, post cardiac catheterization. 4. Ticlid 250 mg b.i.d.  ADDITIONAL DISCHARGE INSTRUCTIONS:  ACTIVITY:  No restrictions. DIET:  Low-sodium heart-healthy and diabetic carbohydrate modified diet. WOUND CARE:  Not applicable. FOLLOWUP APPOINTMENTS:  Please call Dr. Milinda Antis your primary care physician to be seen in the next 1-2 weeks.     Allison L. Rennis Harding, N.P.   ______________________________ Lonia Blood, M.D.    ALE/MEDQ  D:  04/09/2011  T:  04/09/2011  Job:  [id number]  cc:   Marne A. Milinda Antis, MD Doylene Canning. Ladona Ridgel, MD  Electronically Signed by Junious Silk N.P. on 04/09/2011 04:54:09 PM Electronically Signed by Jetty Duhamel M.D. on 04/09/2011 05:34:11 PM

## 2011-04-11 ENCOUNTER — Encounter: Payer: Self-pay | Admitting: *Deleted

## 2011-04-11 LAB — CBC
HCT: 37.4 % (ref 36.0–46.0)
Hemoglobin: 12.3 g/dL (ref 12.0–15.0)
MCHC: 33 g/dL (ref 30.0–36.0)
MCV: 91.9 fL (ref 78.0–100.0)
Platelets: 239 10*3/uL (ref 150–400)
RBC: 4.07 MIL/uL (ref 3.87–5.11)
RDW: 13.3 % (ref 11.5–15.5)
WBC: 6.5 10*3/uL (ref 4.0–10.5)

## 2011-04-11 LAB — URINALYSIS, ROUTINE W REFLEX MICROSCOPIC
Bilirubin Urine: NEGATIVE
Glucose, UA: NEGATIVE mg/dL
Hgb urine dipstick: NEGATIVE
Ketones, ur: NEGATIVE mg/dL
Nitrite: NEGATIVE
Protein, ur: NEGATIVE mg/dL
Specific Gravity, Urine: 1.01 (ref 1.005–1.030)
Urobilinogen, UA: 0.2 mg/dL (ref 0.0–1.0)
pH: 6 (ref 5.0–8.0)

## 2011-04-11 LAB — BASIC METABOLIC PANEL
BUN: 13 mg/dL (ref 6–23)
CO2: 24 mEq/L (ref 19–32)
Calcium: 8.8 mg/dL (ref 8.4–10.5)
Chloride: 102 mEq/L (ref 96–112)
Creatinine, Ser: 0.75 mg/dL (ref 0.4–1.2)
GFR calc Af Amer: >60 mL/min (ref >60)
GFR calc non Af Amer: >60 mL/min (ref >60)
Glucose, Bld: 125 mg/dL — ABNORMAL HIGH (ref 70–99)
Potassium: 4.1 mEq/L (ref 3.5–5.1)
Sodium: 135 mEq/L (ref 135–145)

## 2011-04-11 LAB — CK TOTAL AND CKMB (NOT AT ARMC)
CK, MB: 1.5 ng/mL (ref 0.3–4.0)
Relative Index: INVALID (ref 0.0–2.5)
Total CK: 54 U/L (ref 7–177)

## 2011-04-11 LAB — TROPONIN I: Troponin I: 0.01 ng/mL (ref 0.00–0.06)

## 2011-04-14 NOTE — H&P (Signed)
NAMESERANA, Margaret Vazquez                ACCOUNT NO.:  [id number]  MEDICAL RECORD NO.:  [id number]  LOCATION:  2917                         FACILITY:  [facility]  PHYSICIAN:  Niel Hummer, MD    DATE OF BIRTH:  1936/03/20  DATE OF ADMISSION:  04/07/2011 DATE OF DISCHARGE:                             HISTORY & PHYSICAL   CHIEF COMPLAINT:  Chest pain, headache.  HISTORY OF PRESENT ILLNESS: Very pleasant 75 year old with past medical history of diabetes, hypertension, hyperlipidemia, multiple medications allergies, who presents to the emergency department, complaining of chest pain and headaches.  The patient relates that she woke up this morning and she was in her usual state of health, but then she started to have headache on her right eye.  She checked her blood pressure and it was in the 200 range.  She then went to the fire station and they rechecked her blood pressure and was in the 200/118.  Then, she started to have chest pain. When she arrived to the emergency department, her blood pressure was in the 179 range.  The patient relates that her headache resolved prior to her arriving to the emergency department.  She continued to have chest pain.  She received nitroglycerin x3 in the emergency department without significant relief. Then, she was started on nitroglycerin drip and that has improved her chest pain.  She described the pain as a dull heaviness in the middle of her chest, may be accompanied with some mild shortness of breath.  She denies any chest pain on exertion or at rest.  She was chest pain free after she was started on the nitroglycerin, but then she started to have chest pain again.  She said that the pain is 4/6.  We went ahead and titrated up nitroglycerin.  She denies any numbness, speech problem, or weakness.  She vomited one time.  ALLERGIES:  She has multiple medications allergy.  She has allergy to ASPIRIN, CRESTOR, IBUPROFEN, LIPITOR, ZETIA, she relates  swelling with all of those medications and even she has allergy to PLAVIX, she relates swelling with Lasix.  She also has some problems during anesthesia during a breast biopsy, they were not able to wake her up.  So, she has anaphylaxis with ASPIRIN, CRESTOR, IBUPROFEN, LIPITOR, ZETIA, possible PLAVIX.  PAST MEDICAL HISTORY: 1. History of diabetes. 2. Hypertension. 3. Hyperlipidemia.  MEDICATIONS: 1. Metformin 500 mg p.o. b.i.d. 2. Benicar p.o. daily.  SOCIAL HISTORY:  Denies smoking, alcohol, or recreational drugs.  FAMILY HISTORY:  Her mother, 3 brothers, and a sister has history of heart disease.  She has 3 children and 3 granddaughter.  PHYSICAL EXAMINATION:  VITAL SIGNS:  Temperature 98.1, pulse 69, blood pressure 167/63, respirations 116, sat 98 on room air. GENERAL:  The patient is in no acute distress. HEENT:  Head atraumatic, normocephalic.  Eyes anicteric.  Pupils equal and reactive to light.  Extraocular muscles intact. CARDIOVASCULAR:  S1 and S2, regular rhythm and rate.  No rubs, murmurs, or gallops. LUNGS:  Bilateral good air movement.  No wheezing or crackles. ABDOMEN:  Bowel sounds.  Soft, nontender, nondistended. No rigidity, no guarding.  Obese abdomen. EXTREMITIES:  No edema.  ADMISSION  LABS:  UA negative.  Pro-BNP 646.  PT 13, INR 0.9.  Sodium 139, potassium 3.9, chloride 103, bicarb 26, glucose 103, BUN 11, creatinine 0.69.  Troponin 0.02.  White blood cell 7.9, hemoglobin 13.3, hematocrit 38.8, platelet 235.  Chest x-ray, no active disease.  CT head, negative noncontrast CT.  ASSESSMENT AND PLAN: 1. Malignant hypertension.  The patient's blood pressure has improved     with  nitroglycerin, her blood pressure is in the 150.  We will     continue with nitroglycerin drip to help with chest pain also.  We     will avoid to drop too much blood pressure. 2. Chest pain, rule out acute coronary syndrome.  I will hold an     aspirin due to a prior history of  anaphylaxis.  We will continue     with nitroglycerin.  EKG was sinus rhythm.  No ST-segment elevation     or depression.  We will cycle cardiac enzymes.  I will get a CT     angio to rule out PE and dissection.  I will start Protonix.  We will consult     Cardiology. 3. Headache, could be secondary to malignant hypertension, has     resolved now.  I will consider an MRI. 4. Diabetes, we are going to hold her metformin, sliding scale     insulin sensitive. 5. For DVT prophylaxis, Lovenox.     Niel Hummer, MD     BR/MEDQ  D:  04/07/2011  T:  04/07/2011  Job:  [id number]  Electronically Signed by Niel Hummer MD on 04/14/2011 07:48:10 PM

## 2011-04-15 ENCOUNTER — Ambulatory Visit: Payer: Medicare Other | Admitting: Family Medicine

## 2011-04-15 LAB — I-STAT 8, (EC8 V) (CONVERTED LAB)
Acid-Base Excess: 1
BUN: 10
Bicarbonate: 27.5 — ABNORMAL HIGH
Chloride: 105
Glucose, Bld: 122 — ABNORMAL HIGH
HCT: 46
Hemoglobin: 15.6 — ABNORMAL HIGH
Operator id: 198171
Potassium: 4.3
Sodium: 139
TCO2: 29
pCO2, Ven: 49.1
pH, Ven: 7.357 — ABNORMAL HIGH

## 2011-04-15 LAB — POCT I-STAT CREATININE
Creatinine, Ser: 0.8
Operator id: 198171

## 2011-04-15 LAB — SAMPLE TO BLOOD BANK

## 2011-04-15 LAB — SEDIMENTATION RATE: Sed Rate: 22

## 2011-04-17 NOTE — Consult Note (Signed)
NAMEMENNA, HLADKY NO.:  [id number]  MEDICAL RECORD NO.:  [id number]  LOCATION:  2917                         FACILITY:  [facility]  PHYSICIAN:  Champ Mungo. Lovena Le, MD    DATE OF BIRTH:  15-Sep-1935  DATE OF CONSULTATION:  04/07/2011 DATE OF DISCHARGE:                                CONSULTATION   REQUESTING PHYSICIAN:  Niel Hummer, MD  INDICATION FOR CONSULTATION:  Evaluation of chest pain and hypertensive urgency.  HISTORY OF PRESENT ILLNESS:  The patient is a 75 year old woman with a long-standing history of hypertension.  She also has diabetes.  She has a history of headaches with multiple emergency room visits.  There was a question of a previous TIA.  The patient was in her usual state of health until earlier today when she first experienced a headache.  She was concerned that her blood pressure might be elevated and she went to the fire department where she was found to have a systolic blood pressure over [id number] and diastolic over [id number].  The patient subsequently developed substernal chest pain.  Her headache resolved.  She had one episode of nausea and vomiting which preceded the chest pain.  She is admitted now for additional evaluation.  The patient notes that her initial chest pain was substernal in nature.  This has resolved.  Her current chest pain feels like it is under her left breast.  It does not radiate, does not go to the arm, neck, jaw, or shoulder or back.  Pain has been fairly well controlled with a combination of nitrates and morphine.  She does not note any clear-cut relief with sublingual nitroglycerin.  Her initial EKGs and cardiac markers have been negative.  PAST MEDICAL HISTORY:  Notable for diabetes for several years which has been well controlled and hypertension for over 10 years.  She has a history of a hospitalization and emergency room visits for headaches in the past.  She also has a history of sinus disease and has had  surgery for sinusitis in the past.  The patient had been followed in our GI office as well as in our primary care office for several years.  FAMILY HISTORY:  Strongly positive for coronary disease.  She has several siblings with MIs at young age, and her mother died at age 21 of an MI.  She also had a brother with lung cancer.  ADDITIONAL PAST MEDICAL HISTORY:  Notable for a negative heart catheterization in 1993 and a stress Myoview scan in 2001 which was negative.  REVIEW OF SYSTEMS:  All systems were reviewed and were negative except as noted in the HPI.  PHYSICAL EXAMINATION:  GENERAL:  She is a pleasant 75 year old woman who is not in any acute distress. VITAL SIGNS:  Her blood pressure was 167/98, the pulse was 70 and regular, respirations were 18, temperature 98. HEENT:  Normocephalic and atraumatic.  Pupils equal and round. Oropharynx is moist.  Sclerae anicteric. NECK:  No jugular venous distention, and there is no thyromegaly.  The trachea was midline.  The carotids are 2+ and symmetric. LUNGS:  Clear bilaterally to auscultation.  No wheezes, rales, or rhonchi are present.  There is no increased work of breathing. CARDIOVASCULAR:  Regular rate and rhythm.  Normal S1 and S2.  I did not appreciate an S4 gallop.  The PMI did not appear enlarged or laterally displaced. ABDOMEN:  Soft, nontender.  There is no organomegaly.  Bowel sounds are present.  There is no rebound or guarding. EXTREMITIES:  No cyanosis, clubbing, or edema.  Pulses were 2+ and symmetric. NEUROLOGIC:  Alert and oriented x3 with cranial nerves intact.  The strength is 5/5 and symmetric.  The EKG demonstrates sinus rhythm with normal axis and intervals.  There were no acute ST-T wave changes.  LABORATORY DATA:  Reviewed and negative cardiac markers and otherwise normal labs except for a slightly elevated BNP.  IMPRESSION: 1. Chest pain, mostly atypical in nature in the setting of multiple     cardiac  risk factors and family history for coronary artery     disease. 2. Hypertensive urgency. 3. Headache with no focal deficits. 4. Positive family history of coronary artery disease.  RECOMMENDATIONS:  We agree with using IV nitroglycerin at this time to help control her blood pressure.  We will also give one dose of IV Lopressor.  Her CT of the chest which was ordered in the emergency room is currently pending.  We will follow this up although I think pulmonary embolism is unlikely.  We will obtain serial cardiac enzymes and EKGs. If she rules out for an MI and her symptoms and blood pressure are improved or resolved, then we will consider outpatient cardiac workup. With the patient's negative noncontrast CT scan of the head and no focal deficits, I suspect an MRI of the head will be of low diagnostic yield.     Champ Mungo. Lovena Le, MD     GWT/MEDQ  D:  04/07/2011  T:  04/08/2011  Job:  [id number]  cc:   Marne A. Glori Bickers, MD  Electronically Signed by Cristopher Peru MD on 04/17/2011 06:49:14 PM

## 2011-04-23 ENCOUNTER — Ambulatory Visit: Payer: Medicare Other | Admitting: Family Medicine

## 2011-05-05 ENCOUNTER — Encounter: Payer: Self-pay | Admitting: Family Medicine

## 2011-05-05 ENCOUNTER — Ambulatory Visit (INDEPENDENT_AMBULATORY_CARE_PROVIDER_SITE_OTHER): Payer: Medicare Other | Admitting: Family Medicine

## 2011-05-05 VITALS — BP 160/80 | HR 64 | Temp 97.9°F | Ht 69.0 in | Wt 197.2 lb

## 2011-05-05 DIAGNOSIS — R079 Chest pain, unspecified: Secondary | ICD-10-CM

## 2011-05-05 DIAGNOSIS — Z23 Encounter for immunization: Secondary | ICD-10-CM

## 2011-05-05 DIAGNOSIS — I10 Essential (primary) hypertension: Secondary | ICD-10-CM

## 2011-05-05 DIAGNOSIS — E785 Hyperlipidemia, unspecified: Secondary | ICD-10-CM

## 2011-05-05 MED ORDER — CARVEDILOL 6.25 MG PO TABS: 6.2500 mg | ORAL_TABLET | Freq: Two times a day (BID) | ORAL | Status: DC

## 2011-05-05 NOTE — Assessment & Plan Note (Signed)
Reviewed hosp documents in detail from episode of cp and ha with malig HTN Dx with non obst CAD Will tx with med management -- HTN control, chol control with diet and ticlid  Cp is resolved

## 2011-05-05 NOTE — Patient Instructions (Addendum)
Increase your coreg from 3.125 to total of 6.25 mg twice daily  If any side effects like dizziness/ low heart rate (under 50 beats per minute) , fatigue -- stop it and let me know  This is to control bp better  Our goal for blood pressure is lower than 140/90 Keep checking at home  Eat a low sat fat diet (Avoid red meat/ fried foods/ egg yolks/ fatty breakfast meats/ butter, cheese and high fat dairy/ and shellfish  ) Since you cannot take cholesterol medicine- this is quite important to control cholesterol and heart beat

## 2011-05-05 NOTE — Assessment & Plan Note (Signed)
Improved but not at goal after episode of malignant HTN and cp Imp some on low dose coreg Will inc to 6.25 bid as tolerated- disc poss side eff in detail- and enc to follow pulse rate as well as bp Will call if problems F/u 3 mo or earlier

## 2011-05-05 NOTE — Assessment & Plan Note (Signed)
With recent dx of non obst CAD and intol to all statin and non statin meds tried Stressed dire importance of low sat fat diet  Rev last labs in detail Rev low sat fat diet in detail as well - with handouts Will check again at f/u

## 2011-05-05 NOTE — Progress Notes (Signed)
Subjective:    Patient ID: Margaret Vazquez, female    DOB: [birth year], 75 y.o.   MRN: [id number]  HPI Here for f/u of hosp early this mo for cp and inc bp -- with finding of nonobst cad  Her bp came down with nitro drip quickly in hosp Due to ha - CT head done and normal- ha got better Developed cp with a small bump in troponin (despite nl ekg)- cardiac cath was done by Dr Excell Seltzer  Found non obst CAD rec start on coreg low dose to help control bp  No plavix or asa due to intol of both of these  Understood she does not tol chol med - LDL 127 (lower it best we can with diet) CXR/ d dimer nl   Adv to f/u here   Is doing ok with the coreg  bp is 150-160 systolic  Was told we may increase the coreg  Is having issues with skin cancer -- sees Dr Adolphus Birchwood  Recurrent lesion on R elbow Just found lesion inside eye lid on L  Feels like she is "falling apart" lately  Lost her sister in law last Sunday Sister has to have heart cath too  Lots of stress  bp may be stress related   Urine smells bad Does not take herbs Has happened since she started ticlid - it may be a side effect of that  Urine cx from hosp came back looking contaminated No other symptoms  Patient Active Problem List  Diagnoses  . DIABETES MELLITUS, TYPE II  . VITAMIN B12 DEFICIENCY  . HYPERLIPIDEMIA  . UNSPECIFIED ANEMIA  . HYPERTENSION, ESSENTIAL NOS  . ALLERGIC RHINITIS  . GERD  . OVERACTIVE BLADDER  . BACK PAIN, LUMBAR  . INCONTINENCE, URGE  . SKIN CANCER, HX OF  . PERSONAL HX COLONIC POLYPS  . Urticaria  . Leg pain, bilateral  . Chest pain   Past Medical History  Diagnosis Date  . Hypertension   . Diabetes mellitus     type II  . Hyperlipidemia     myalgias with Lipitor and Zetia  . GERD (gastroesophageal reflux disease)   . Skin cancer     hx of basal cell/ak's  . Colon polyps 2009  . Allergy history, drug     Aspirin  . Cervical stenosis of spine     With neck pain  . CAD (coronary artery  disease) 04/08/11    non obst by cath   Past Surgical History  Procedure Date  . Appendectomy   . Tubal ligation     BTL  . Breast surgery     breast biopsy  . Eye surgery 2005    tear duct surgery  . Nasal sinus surgery 01/2005  . Cardiac catheterization 1993    LHC: normal coronaries.   . Stress cardiolite 11/1999    Normal/ negative  . Cystoscopy w/ decannulation 03/2000    Normal  . Shoulder surgery 10/2002    Impingement  . Abd [place] 07/2003    Negative  . Tear duct surgery 2005  . Colonoscopy 12/2007    Adenomatous colon polyps  . Cardiac catheterization 04/07/2011    non obst CAD (Dr Excell Seltzer)   History  Substance Use Topics  . Smoking status: Never Smoker   . Smokeless tobacco: Never Used  . Alcohol Use: No   Family History  Problem Relation Age of Onset  . Lung cancer Brother   . Colon cancer Neg Hx   .  Colon polyps Other    Allergies  Allergen Reactions  . Aspirin     REACTION: Hives  . Atorvastatin     REACTION: joint pain/swelling, inc liver tests  . Bee Venom   . Clopidogrel Bisulfate     REACTION: ?  . Codeine     REACTION: vomiting  . Ezetimibe     REACTION: fatigue  . WUJ:WJXBJYNWGNF+AOZHYQMVH+QIONGEXBMW Acid+Aspartame     REACTION: swelling lip/hives  . Nabumetone     REACTION: anaphalaxis  . Valsartan     REACTION: fatigue   Current Outpatient Prescriptions on File Prior to Visit  Medication Sig Dispense Refill  . EPINEPHrine (EPIPEN) 0.3 mg/0.3 mL DEVI Inject 0.3 mLs (0.3 mg total) into the muscle once.  1 Device  3  . glucose blood test strip Check blood sugar twice a day and as needed for diabetes 250.0       . metFORMIN (GLUCOPHAGE) 1000 MG tablet Take 1,000 mg by mouth 2 (two) times daily with a meal.        . olmesartan (BENICAR) 40 MG tablet Take 1 tablet (40 mg total) by mouth daily.  30 tablet  11  . Probiotic Product (ALIGN) 4 MG CAPS Take 1 capsule by mouth daily.        . ticlopidine (TICLID) 250 MG tablet Take 250 mg by mouth  2 (two) times daily.       . cyanocobalamin (,VITAMIN B-12,) 1000 MCG/ML injection Inject 1,000 mcg into the muscle every 30 (thirty) days.        . rosuvastatin (CRESTOR) 5 MG tablet Take 5 mg by mouth every other day.              Review of Systems Review of Systems  Constitutional: Negative for fever, appetite change, fatigue and unexpected weight change.  Eyes: Negative for pain and visual disturbance.  Respiratory: Negative for cough and shortness of breath.   Cardiovascular: Negative for cp or palpitations    Gastrointestinal: Negative for nausea, diarrhea and constipation.  Genitourinary: Negative for urgency and frequency.  Skin: Negative for pallor or rash   Neurological: Negative for weakness, light-headedness, numbness and headaches. (no longer has any headache) Hematological: Negative for adenopathy. Does not bruise/bleed easily.  Psychiatric/Behavioral: Negative for dysphoric mood. Pos for anxiety from stress.          Objective:   Physical Exam  Constitutional: She appears well-developed and well-nourished. No distress.       overwt and well appearing  Wound under L eye from recent biopsy  HENT:  Head: Normocephalic and atraumatic.  Mouth/Throat: Oropharynx is clear and moist.  Eyes: Conjunctivae and EOM are normal. Pupils are equal, round, and reactive to light. No scleral icterus.       Redness at inner corner of L lower lid from recent bx   Neck: Normal range of motion. Neck supple. No JVD present. Carotid bruit is not present. No thyromegaly present.  Cardiovascular: Normal rate, regular rhythm, normal heart sounds and intact distal pulses.  Exam reveals no gallop.   Pulmonary/Chest: Effort normal and breath sounds normal. No respiratory distress. She has no wheezes. She exhibits no tenderness.  Abdominal: Soft. Bowel sounds are normal. She exhibits no distension, no abdominal bruit and no mass. There is no tenderness.       No suprapubic tenderness    Musculoskeletal: Normal range of motion. She exhibits no edema and no tenderness.  Lymphadenopathy:    She has no cervical adenopathy.  Neurological: She is alert. She has normal reflexes. No cranial nerve deficit. She exhibits normal muscle tone. Coordination normal.       No facial droop  Skin: Skin is warm and dry. No rash noted. No erythema. No pallor.  Psychiatric: She has a normal mood and affect.       Nl affect but seems generally stressed and tired Husband is with her - does much of the talking about himself which is distracting           Assessment & Plan:

## 2011-07-07 ENCOUNTER — Other Ambulatory Visit: Payer: Self-pay | Admitting: Family Medicine

## 2011-07-08 LAB — HM MAMMOGRAPHY: HM Mammogram: NORMAL

## 2011-07-30 ENCOUNTER — Encounter: Payer: Self-pay | Admitting: Family Medicine

## 2011-07-30 ENCOUNTER — Ambulatory Visit (INDEPENDENT_AMBULATORY_CARE_PROVIDER_SITE_OTHER): Payer: Medicare Other | Admitting: Family Medicine

## 2011-07-30 VITALS — BP 140/75 | HR 68 | Temp 97.8°F | Ht 69.0 in | Wt 198.0 lb

## 2011-07-30 DIAGNOSIS — R197 Diarrhea, unspecified: Secondary | ICD-10-CM | POA: Insufficient documentation

## 2011-07-30 DIAGNOSIS — R82998 Other abnormal findings in urine: Secondary | ICD-10-CM

## 2011-07-30 DIAGNOSIS — N390 Urinary tract infection, site not specified: Secondary | ICD-10-CM

## 2011-07-30 DIAGNOSIS — I10 Essential (primary) hypertension: Secondary | ICD-10-CM

## 2011-07-30 DIAGNOSIS — R8290 Unspecified abnormal findings in urine: Secondary | ICD-10-CM | POA: Insufficient documentation

## 2011-07-30 LAB — POCT URINALYSIS DIPSTICK
Bilirubin, UA: NEGATIVE
Glucose, UA: NEGATIVE
Leukocytes, UA: NEGATIVE
Nitrite, UA: NEGATIVE
Spec Grav, UA: >=1.03
Urobilinogen, UA: 0.2
pH, UA: 7

## 2011-07-30 LAB — POCT UA - MICROSCOPIC ONLY

## 2011-07-30 MED ORDER — SULFAMETHOXAZOLE-TRIMETHOPRIM 800-160 MG PO TABS: 1.0000 | ORAL_TABLET | Freq: Two times a day (BID) | ORAL | Status: AC

## 2011-07-30 NOTE — Progress Notes (Signed)
Subjective:    Patient ID: Margaret Vazquez, female    DOB: Mar 03, 1936, 76 y.o.   MRN: [id number]  HPI Here for f/u of HTN and also new problems of diarrhea and urine odor  bp is   146/72  Today Pulse of 68 Last visit inc coreg to 6.25 bid No cp or palpitations or headaches or edema  No side effects to medicines    Wt is stable  bmi is 29  Stools-- diarrhea is terrible - not nl bm in a year or more 3-4 times per day  Tries to keep clean from that  colonosc ok less than 5 years ago  Has seen Dr Christella Hartigan -- did not return/ and tried align  ? IBS  Eats raisin bran and fiber too   Odor to urine (like when she had infx in past )  ua is one plus blood and hazy No dysuria or change in urine frequency No visible blood in urine or flank pain   Lab Results  Component Value Date   HGBA1C 6.8* 04/07/2011    Patient Active Problem List  Diagnoses  . DIABETES MELLITUS, TYPE II  . VITAMIN B12 DEFICIENCY  . HYPERLIPIDEMIA  . UNSPECIFIED ANEMIA  . HYPERTENSION, ESSENTIAL NOS  . ALLERGIC RHINITIS  . GERD  . OVERACTIVE BLADDER  . BACK PAIN, LUMBAR  . INCONTINENCE, URGE  . SKIN CANCER, HX OF  . PERSONAL HX COLONIC POLYPS  . Urticaria  . Leg pain, bilateral  . Chest pain  . Abnormal urine odor  . Diarrhea  . UTI (lower urinary tract infection)   Past Medical History  Diagnosis Date  . Hypertension   . Diabetes mellitus     type II  . Hyperlipidemia     myalgias with Lipitor and Zetia  . GERD (gastroesophageal reflux disease)   . Skin cancer     hx of basal cell/ak's  . Colon polyps 2009  . Allergy history, drug     Aspirin  . Cervical stenosis of spine     With neck pain  . CAD (coronary artery disease) 04/08/11    non obst by cath   Past Surgical History  Procedure Date  . Appendectomy   . Tubal ligation     BTL  . Breast surgery     breast biopsy  . Eye surgery 2005    tear duct surgery  . Nasal sinus surgery 01/2005  . Cardiac catheterization 1993    LHC:  normal coronaries.   . Stress cardiolite 11/1999    Normal/ negative  . Cystoscopy w/ decannulation 03/2000    Normal  . Shoulder surgery 10/2002    Impingement  . Abd [place] 07/2003    Negative  . Tear duct surgery 2005  . Colonoscopy 12/2007    Adenomatous colon polyps  . Cardiac catheterization 04/07/2011    non obst CAD (Dr Excell Seltzer)   History  Substance Use Topics  . Smoking status: Never Smoker   . Smokeless tobacco: Never Used  . Alcohol Use: No   Family History  Problem Relation Age of Onset  . Lung cancer Brother   . Colon cancer Neg Hx   . Colon polyps Other    Allergies  Allergen Reactions  . Aspirin     REACTION: Hives  . Atorvastatin     REACTION: joint pain/swelling, inc liver tests  . Bee Venom   . Clopidogrel Bisulfate     REACTION: ?  . Codeine  REACTION: vomiting  . Ezetimibe     REACTION: fatigue  . WRU:EAVWUJWJXBJ+YNWGNFAOZ+HYQMVHQION Acid+Aspartame     REACTION: swelling lip/hives  . Nabumetone     REACTION: anaphalaxis  . Valsartan     REACTION: fatigue   Current Outpatient Prescriptions on File Prior to Visit  Medication Sig Dispense Refill  . carvedilol (COREG) 6.25 MG tablet Take 1 tablet (6.25 mg total) by mouth 2 (two) times daily.  60 tablet  11  . glucose blood test strip Check blood sugar twice a day and as needed for diabetes 250.0       . metFORMIN (GLUCOPHAGE) 1000 MG tablet TAKE ONE TABLET BY MOUTH TWICE DAILY  180 tablet  2  . olmesartan (BENICAR) 40 MG tablet Take 1 tablet (40 mg total) by mouth daily.  30 tablet  11  . Probiotic Product (ALIGN) 4 MG CAPS Take 1 capsule by mouth daily.        . rosuvastatin (CRESTOR) 5 MG tablet Take 5 mg by mouth every other day.        . ticlopidine (TICLID) 250 MG tablet Take 250 mg by mouth 2 (two) times daily.       . cyanocobalamin (,VITAMIN B-12,) 1000 MCG/ML injection Inject 1,000 mcg into the muscle every 30 (thirty) days.        Marland Kitchen EPINEPHrine (EPIPEN) 0.3 mg/0.3 mL DEVI Inject 0.3 mLs  (0.3 mg total) into the muscle once.  1 Device  3     Review of Systems Review of Systems  Constitutional: Negative for fever, appetite change, fatigue and unexpected weight change.  Eyes: Negative for pain and visual disturbance.  Respiratory: Negative for cough and shortness of breath.   Cardiovascular: Negative for cp or palpitations    Gastrointestinal: Negative for nausea, and constipation. no abd pain or blood in stool Genitourinary: Negative for urgency and frequency.  Skin: Negative for pallor or rash   Neurological: Negative for weakness, light-headedness, numbness and headaches.  Hematological: Negative for adenopathy. Does not bruise/bleed easily.  Psychiatric/Behavioral: Negative for dysphoric mood. The patient is not nervous/anxious.          Objective:   Physical Exam  Constitutional: She appears well-developed and well-nourished. No distress.  HENT:  Head: Normocephalic and atraumatic.  Mouth/Throat: Oropharynx is clear and moist.  Eyes: Conjunctivae and EOM are normal. Pupils are equal, round, and reactive to light. No scleral icterus.  Neck: Normal range of motion. Neck supple. No JVD present. Carotid bruit is not present. No thyromegaly present.  Cardiovascular: Normal rate, regular rhythm, normal heart sounds and intact distal pulses.  Exam reveals no gallop.   Pulmonary/Chest: Effort normal and breath sounds normal. No respiratory distress. She has no wheezes.  Abdominal: Soft. Bowel sounds are normal. She exhibits no distension, no abdominal bruit and no mass. There is no tenderness. There is no rebound and no guarding.       No suprapubic tenderness   No cva tenderness   Musculoskeletal: Normal range of motion. She exhibits no edema.  Lymphadenopathy:    She has no cervical adenopathy.  Neurological: She is alert. She has normal reflexes. No cranial nerve deficit. She exhibits normal muscle tone. Coordination normal.  Skin: Skin is warm and dry. No erythema.  No pallor.  Psychiatric: She has a normal mood and affect.          Assessment & Plan:

## 2011-07-30 NOTE — Assessment & Plan Note (Signed)
?   IBS or from metformin Trial of no metformin for 5 d and update  Then will make a plan  If  No imp- will need to f/u with Dr Christella Hartigan

## 2011-07-30 NOTE — Assessment & Plan Note (Signed)
Improved with inc coreg Will keep checking at home  Disc lifestyle change/wt loss

## 2011-07-30 NOTE — Assessment & Plan Note (Signed)
Suspect from uti- see assessment

## 2011-07-30 NOTE — Assessment & Plan Note (Signed)
tx with septra Fluids Info given  Update if not starting to improve in a week or if worsening

## 2011-07-30 NOTE — Patient Instructions (Signed)
Hold your metformin for 5 days (sugar will go up )- and then call and tell me if diarrhea improves  you have uti -- drink lots of water  Take septra for this twice daily for 5 days -then let me know if the urine odor does not go away  Blood pressure is improved - use your arm cuff to checkit  Follow up with me in 3 months

## 2011-08-06 ENCOUNTER — Telehealth: Payer: Self-pay

## 2011-08-06 DIAGNOSIS — R8290 Unspecified abnormal findings in urine: Secondary | ICD-10-CM

## 2011-08-06 NOTE — Telephone Encounter (Signed)
Pt called to update Dr Milinda Antis that she has finished antibiotic but still has urine odor. No other urinary symptoms. Pt stopped metformin 07/30/11 and started back on 08/04/11. Pt did not have diarrhea while off Metformin. But on Monday after taking Metformin had diarrhea x 5.  Pt did not take Metformin on 08/05/11 had no diarrhea. Pt took Metformin today but no diarrhea so far.Please advise. Pt uses [facility] in [place] if pharmacy needed. Pt can be reached at [phone] or cell [phone].

## 2011-08-07 ENCOUNTER — Other Ambulatory Visit: Payer: Self-pay | Admitting: Family Medicine

## 2011-08-07 DIAGNOSIS — R8290 Unspecified abnormal findings in urine: Secondary | ICD-10-CM

## 2011-08-07 MED ORDER — GLIPIZIDE ER 5 MG PO TB24: 5.0000 mg | ORAL_TABLET | Freq: Every day | ORAL | Status: DC

## 2011-08-07 NOTE — Telephone Encounter (Signed)
Stop metformin and start glucotrol xl- checking sugar bid and f/u 2 wk with sugars  Also please drop off urine for cx Here is future order for that Px written for call in

## 2011-08-07 NOTE — Telephone Encounter (Signed)
Patient notified as instructed by telephone. Pt will stop metformin and Glucotrol xl sent electronically to [facility]. Pt scheduled f/u appt with Dr Milinda Antis 08/22/11 at 12 noon and will bring BS log to that appt. Pt will come by this afternoon to give urine for culture.

## 2011-08-08 LAB — HM DIABETES EYE EXAM: HM Diabetic Eye Exam: NORMAL

## 2011-08-09 LAB — URINE CULTURE
Colony Count: NO GROWTH
Organism ID, Bacteria: NO GROWTH

## 2011-08-15 ENCOUNTER — Telehealth: Payer: Self-pay | Admitting: Family Medicine

## 2011-08-15 NOTE — Telephone Encounter (Signed)
Patient notified as instructed by telephone. Please see result note.

## 2011-08-21 ENCOUNTER — Ambulatory Visit (INDEPENDENT_AMBULATORY_CARE_PROVIDER_SITE_OTHER): Payer: Medicare Other | Admitting: Internal Medicine

## 2011-08-21 ENCOUNTER — Telehealth: Payer: Self-pay | Admitting: Family Medicine

## 2011-08-21 ENCOUNTER — Encounter: Payer: Self-pay | Admitting: Internal Medicine

## 2011-08-21 DIAGNOSIS — R519 Headache, unspecified: Secondary | ICD-10-CM

## 2011-08-21 DIAGNOSIS — R51 Headache: Secondary | ICD-10-CM

## 2011-08-21 NOTE — Patient Instructions (Signed)
Please try heat over the painful area---this seems to be a painful muscle that is having spasm at times

## 2011-08-21 NOTE — Progress Notes (Signed)
Subjective:    Patient ID: Margaret Vazquez, female    DOB: 01-04-1936, 76 y.o.   MRN: [id number]  HPI Awoke with headache in back of head yesterday morning Has stayed with her Pressure feeling, then throbs behind left ear and makes her nauseated intermittently  No new activities No known injury No new pillow  Not like the headache she had when in the hospital Tries not to move head due to vascular problem anyway--not clear if movement worsens  No diplopia or unilateral vision loss No weakness like stroke  No sinus congestion lately No drainage  Current Outpatient Prescriptions on File Prior to Visit  Medication Sig Dispense Refill  . carvedilol (COREG) 6.25 MG tablet Take 1 tablet (6.25 mg total) by mouth 2 (two) times daily.  60 tablet  11  . cyanocobalamin (,VITAMIN B-12,) 1000 MCG/ML injection Inject 1,000 mcg into the muscle every 30 (thirty) days.        Marland Kitchen EPINEPHrine (EPIPEN) 0.3 mg/0.3 mL DEVI Inject 0.3 mLs (0.3 mg total) into the muscle once.  1 Device  3  . glipiZIDE (GLUCOTROL XL) 5 MG 24 hr tablet Take 1 tablet (5 mg total) by mouth daily.  30 tablet  11  . glucose blood test strip Check blood sugar twice a day and as needed for diabetes 250.0       . olmesartan (BENICAR) 40 MG tablet Take 1 tablet (40 mg total) by mouth daily.  30 tablet  11  . Probiotic Product (ALIGN) 4 MG CAPS Take 1 capsule by mouth daily.        . rosuvastatin (CRESTOR) 5 MG tablet Take 5 mg by mouth every other day.        . ticlopidine (TICLID) 250 MG tablet Take 250 mg by mouth 2 (two) times daily.         Allergies  Allergen Reactions  . Aspirin     REACTION: Hives  . Atorvastatin     REACTION: joint pain/swelling, inc liver tests  . Bee Venom   . Clopidogrel Bisulfate     REACTION: ?  . Codeine     REACTION: vomiting  . Ezetimibe     REACTION: fatigue  . NWG:NFAOZHYQMVH+QIONGEXBM+WUXLKGMWNU Acid+Aspartame     REACTION: swelling lip/hives  . Metformin And Related     Diarrhea     . Nabumetone     REACTION: anaphalaxis  . Valsartan     REACTION: fatigue    Past Medical History  Diagnosis Date  . Hypertension   . Diabetes mellitus     type II  . Hyperlipidemia     myalgias with Lipitor and Zetia  . GERD (gastroesophageal reflux disease)   . Skin cancer     hx of basal cell/ak's  . Colon polyps 2009  . Allergy history, drug     Aspirin  . Cervical stenosis of spine     With neck pain  . CAD (coronary artery disease) 04/08/11    non obst by cath    Past Surgical History  Procedure Date  . Appendectomy   . Tubal ligation     BTL  . Breast surgery     breast biopsy  . Eye surgery 2005    tear duct surgery  . Nasal sinus surgery 01/2005  . Cardiac catheterization 1993    LHC: normal coronaries.   . Stress cardiolite 11/1999    Normal/ negative  . Cystoscopy w/ decannulation 03/2000    Normal  . Shoulder  surgery 10/2002    Impingement  . Abd [place] 07/2003    Negative  . Tear duct surgery 2005  . Colonoscopy 12/2007    Adenomatous colon polyps  . Cardiac catheterization 04/07/2011    non obst CAD (Dr Excell Seltzer)    Family History  Problem Relation Age of Onset  . Lung cancer Brother   . Colon cancer Neg Hx   . Colon polyps Other     History   Social History  . Marital Status: Married    Spouse Name: N/A    Number of Children: N/A  . Years of Education: N/A   Occupational History  . Retired    Social History Main Topics  . Smoking status: Never Smoker   . Smokeless tobacco: Never Used  . Alcohol Use: No  . Drug Use: No  . Sexually Active: Not on file   Other Topics Concern  . Not on file   Social History Narrative   3 childrenDoes not drink caffeinated beveragesCares for SIL with dementia   Review of Systems No chest pain    Objective:   Physical Exam  Constitutional: She appears well-developed and well-nourished. No distress.  Eyes: EOM are normal.       No nystagmus  Neck:       Limited ROM  Slight tenderness along  body of left trapezius--this is pain site   Lymphadenopathy:    She has no cervical adenopathy.  Neurological: She has normal strength. She displays no atrophy and no tremor. No cranial nerve deficit. She exhibits normal muscle tone. Coordination normal.       Slight instability in gait--not true ataxia Slight trouble with Romberg--stable, then had to put her hand on the wall          Assessment & Plan:

## 2011-08-21 NOTE — Telephone Encounter (Signed)
Triage Record Num: [id number] Operator: Winona Legato Patient Name: Margaret Vazquez Call Date & Time: 08/21/2011 9:57:46AM Patient Phone: [phone] PCP: [name]. Tower Patient Gender: Female PCP Fax : Patient DOB: 1935/10/18 Practice Name: [facility] Reason for Call: Caller: [place]/Mother; PCP: Loura Pardon A.; CB#: [phone]; ; ; Call regarding Pressure Behind Her Ear and Her Head; PT calling regarding Pressure behind her left ear and down that side of her neck; onset 2/13. No other sxs. PT hx of HTN -BP at 0930-163/73 P-70. Tx in Nov. 2012 for mild MI w/ Bp of 200/90's. Emergent sxs of Headache r/o. Disp: 24 hrs: Typical headache AND usual therapy is not working. Made appt for 1615 with Dr. Silvio Pate. Relayed to pt with strict call back parameters. Protocol(s) Used: Headache Recommended Outcome per Protocol: See Provider within 24 hours Reason for Outcome: Typical headache AND usual therapy is not available or is not working Care Advice: ~ Another adult should drive. ~ Do not drink alcoholic beverages or use tobacco products. ~ Avoid known causes and factors that trigger headaches. ~ Do not skip or delay meals, unless vomiting. ~ Call provider if symptoms worsen or new symptoms develop. Call EMS 911 immediately if any of the following occur: any loss of consciousness; new confusion, drowsiness or agitation; difficulty speaking; new weakness or paralysis, severe numbness, or difficulty moving. ~ ~ SYMPTOM / CONDITION MANAGEMENT ~ CAUTIONS ~ List, or take, all current prescription(s), nonprescription or alternative medication(s) to provider for evaluation. Most adults need to drink 6-10 eight-ounce glasses (1.2-2.0 liters) of fluids per day unless previously told to limit fluid intake for other medical reasons. Limit fluids that contain caffeine, sugar or alcohol. Urine will be a very light yellow color when you drink enough fluids. ~ 08/21/2011 10:20:44AM Page 1 of 1  [id number]

## 2011-08-21 NOTE — Assessment & Plan Note (Signed)
Seems to be isolated at left trapezius Her gait is less stable then usual but no true ataxia---I don't suspect brain stem stroke Discussed heat on that Avoid muscle relaxers Dr Milinda Antis to review tomorrow at regular appt

## 2011-08-22 ENCOUNTER — Ambulatory Visit (INDEPENDENT_AMBULATORY_CARE_PROVIDER_SITE_OTHER): Payer: Medicare Other | Admitting: Family Medicine

## 2011-08-22 ENCOUNTER — Ambulatory Visit (INDEPENDENT_AMBULATORY_CARE_PROVIDER_SITE_OTHER)
Admission: RE | Admit: 2011-08-22 | Discharge: 2011-08-22 | Disposition: A | Discharge status: Home / Self Care | Payer: Medicare Other | Source: Ambulatory Visit | Attending: Family Medicine | Admitting: Family Medicine

## 2011-08-22 ENCOUNTER — Encounter: Payer: Self-pay | Admitting: Family Medicine

## 2011-08-22 VITALS — BP 118/72 | HR 67 | Temp 97.7°F | Ht 69.0 in | Wt 201.1 lb

## 2011-08-22 DIAGNOSIS — R519 Headache, unspecified: Secondary | ICD-10-CM

## 2011-08-22 DIAGNOSIS — R51 Headache: Secondary | ICD-10-CM

## 2011-08-22 DIAGNOSIS — E119 Type 2 diabetes mellitus without complications: Secondary | ICD-10-CM

## 2011-08-22 DIAGNOSIS — E785 Hyperlipidemia, unspecified: Secondary | ICD-10-CM

## 2011-08-22 DIAGNOSIS — I10 Essential (primary) hypertension: Secondary | ICD-10-CM

## 2011-08-22 NOTE — Patient Instructions (Signed)
We will do CT scan referral and neurology referral at check out  Schedule fasting lab and follow up in 3 months Continue current medicines  When feeling better- try to work up to exercise 5 days per week

## 2011-08-22 NOTE — Assessment & Plan Note (Signed)
Off metformin due to diarrhea severe Doing ok on glipizide Has to be more careful with evening eating  Will add exercise when able  Disc low glycemic diet  a1c and f/u 3 mo

## 2011-08-22 NOTE — Progress Notes (Signed)
Subjective:    Patient ID: Margaret Vazquez, female    DOB: 1936-07-05, 76 y.o.   MRN: [id number]  HPI Here for DM f/u  Is on glipizide ER 5 mg Intol of metformin Wt is up 1 lb with bmi of 29 Diabetes Home sugar results am good - usually 120s or below , Pm probably avg 150-160 range - occ high if eats wrong  DM diet - is watching what she eats , better than before  Exercise -- not a lot , since her SIL died - under stress and getting things done- so now more active  Symptoms- no thirst or inc urination  A1C last  Lab Results  Component Value Date   HGBA1C 6.8* 04/07/2011   off metformin for 2 weeks   No problems with medications  Renal protection- on benicar  Last eye exam    Still has a headache - back of head behind ear  Saw Dr Alphonsus Sias yesterday- sharp shooting pains - and thought it was  From trap spasm  Today bp good  [name] is still there  Sometimes makes her nauseated  Wants to go ahead and see Dr Pearlean Brownie   Patient Active Problem List  Diagnoses  . DIABETES MELLITUS, TYPE II  . VITAMIN B12 DEFICIENCY  . HYPERLIPIDEMIA  . UNSPECIFIED ANEMIA  . HYPERTENSION, ESSENTIAL NOS  . ALLERGIC RHINITIS  . GERD  . OVERACTIVE BLADDER  . BACK PAIN, LUMBAR  . INCONTINENCE, URGE  . SKIN CANCER, HX OF  . PERSONAL HX COLONIC POLYPS  . Urticaria  . Leg pain, bilateral  . Chest pain  . Abnormal urine odor  . Diarrhea  . UTI (lower urinary tract infection)  . Headache in back of head   Past Medical History  Diagnosis Date  . Hypertension   . Diabetes mellitus     type II  . Hyperlipidemia     myalgias with Lipitor and Zetia  . GERD (gastroesophageal reflux disease)   . Skin cancer     hx of basal cell/ak's  . Colon polyps 2009  . Allergy history, drug     Aspirin  . Cervical stenosis of spine     With neck pain  . CAD (coronary artery disease) 04/08/11    non obst by cath   Past Surgical History  Procedure Date  . Appendectomy   . Tubal ligation     BTL  . Breast  surgery     breast biopsy  . Eye surgery 2005    tear duct surgery  . Nasal sinus surgery 01/2005  . Cardiac catheterization 1993    LHC: normal coronaries.   . Stress cardiolite 11/1999    Normal/ negative  . Cystoscopy w/ decannulation 03/2000    Normal  . Shoulder surgery 10/2002    Impingement  . Abd [place] 07/2003    Negative  . Tear duct surgery 2005  . Colonoscopy 12/2007    Adenomatous colon polyps  . Cardiac catheterization 04/07/2011    non obst CAD (Dr Excell Seltzer)   History  Substance Use Topics  . Smoking status: Never Smoker   . Smokeless tobacco: Never Used  . Alcohol Use: No   Family History  Problem Relation Age of Onset  . Lung cancer Brother   . Colon cancer Neg Hx   . Colon polyps Other    Allergies  Allergen Reactions  . Aspirin     REACTION: Hives  . Atorvastatin     REACTION: joint pain/swelling,  inc liver tests  . Bee Venom   . Clopidogrel Bisulfate     REACTION: ?  . Codeine     REACTION: vomiting  . Ezetimibe     REACTION: fatigue  . ZOX:[id number]+XBJYNWGNF+AOZHYQMVHQ Acid+Aspartame     REACTION: swelling lip/hives  . Metformin And Related     Diarrhea   . Nabumetone     REACTION: anaphalaxis  . Valsartan     REACTION: fatigue   Current Outpatient Prescriptions on File Prior to Visit  Medication Sig Dispense Refill  . carvedilol (COREG) 6.25 MG tablet Take 1 tablet (6.25 mg total) by mouth 2 (two) times daily.  60 tablet  11  . cyanocobalamin (,VITAMIN B-12,) 1000 MCG/ML injection Inject 1,000 mcg into the muscle every 30 (thirty) days.        Marland Kitchen EPINEPHrine (EPIPEN) 0.3 mg/0.3 mL DEVI Inject 0.3 mLs (0.3 mg total) into the muscle once.  1 Device  3  . glipiZIDE (GLUCOTROL XL) 5 MG 24 hr tablet Take 1 tablet (5 mg total) by mouth daily.  30 tablet  11  . glucose blood test strip Check blood sugar twice a day and as needed for diabetes 250.0       . olmesartan (BENICAR) 40 MG tablet Take 1 tablet (40 mg total) by mouth daily.  30 tablet   11  . Probiotic Product (ALIGN) 4 MG CAPS Take 1 capsule by mouth daily.        . ticlopidine (TICLID) 250 MG tablet Take 250 mg by mouth 2 (two) times daily.           Review of Systems Review of Systems  Constitutional: Negative for fever, appetite change, fatigue and unexpected weight change.  Eyes: Negative for pain and visual disturbance.  Respiratory: Negative for cough and shortness of breath.   Cardiovascular: Negative for cp or palpitations    Gastrointestinal: Negative for nausea, diarrhea and constipation. (diarrhea better off metformin) Genitourinary: Negative for urgency and frequency.  Skin: Negative for pallor or rash   Neurological: Negative for weakness, light-headedness, numbness and pos for headache that did extend into neck Neg for facial droop/ or problems with speech  Hematological: Negative for adenopathy. Does not bruise/bleed easily.  Psychiatric/Behavioral: Negative for dysphoric mood. The patient is not nervous/anxious.          Objective:   Physical Exam  Constitutional: She appears well-developed and well-nourished. No distress.  HENT:  Head: Normocephalic and atraumatic.  Right Ear: External ear normal.  Left Ear: External ear normal.  Nose: Nose normal.  Mouth/Throat: Oropharynx is clear and moist.       Nares clear No sinus tenderness No temporal tenderness   Eyes: Conjunctivae and EOM are normal. Pupils are equal, round, and reactive to light. Right eye exhibits no discharge. Left eye exhibits no discharge. No scleral icterus.  Neck: Normal range of motion. Neck supple. No JVD present. Carotid bruit is not present. Erythema present. No thyromegaly present.       Today has nl rom of neck Some tenderness of trap bilat   Cardiovascular: Normal rate, regular rhythm, normal heart sounds and intact distal pulses.  Exam reveals no gallop.   Pulmonary/Chest: Effort normal and breath sounds normal. No respiratory distress. She has no wheezes.    Abdominal: Soft. Bowel sounds are normal. She exhibits no distension, no abdominal bruit and no mass. There is no tenderness.  Musculoskeletal: Normal range of motion. She exhibits no edema and no tenderness.  Lymphadenopathy:  She has no cervical adenopathy.  Neurological: She is alert. She has normal reflexes. She displays no tremor. No cranial nerve deficit or sensory deficit. She exhibits normal muscle tone. Coordination and gait normal.  Skin: Skin is warm and dry. No rash noted. No erythema. No pallor.  Psychiatric: She has a normal mood and affect.          Assessment & Plan:

## 2011-08-22 NOTE — Assessment & Plan Note (Signed)
Off crestor- intolerant  Check lipid 3 mo -will be up  Rev low sat fat diet

## 2011-08-22 NOTE — Assessment & Plan Note (Signed)
No improvement  Worrisome due to age and cva risk factors No trauma No change in exam- a little unsteady Ordered CT w/o contrast  F/u Dr Pearlean Brownie also

## 2011-08-22 NOTE — Assessment & Plan Note (Signed)
bp in fair control at this time - despite ha  No changes needed  Disc lifstyle change with low sodium diet and exercise

## 2011-08-28 ENCOUNTER — Other Ambulatory Visit: Payer: Self-pay | Admitting: Family Medicine

## 2011-08-28 NOTE — Telephone Encounter (Signed)
[facility] [place] request One touch ultra test strips #100 x 11.

## 2011-08-29 ENCOUNTER — Emergency Department: Payer: Self-pay | Admitting: *Deleted

## 2011-08-30 LAB — URINALYSIS, COMPLETE
Bilirubin,UR: NEGATIVE
Blood: NEGATIVE
Glucose,UR: NEGATIVE mg/dL (ref 0–75)
Ketone: NEGATIVE
Nitrite: NEGATIVE
Ph: 6 (ref 4.5–8.0)
Protein: NEGATIVE
RBC,UR: NONE SEEN /HPF (ref 0–5)
Specific Gravity: 1.01 (ref 1.003–1.030)
Squamous Epithelial: 5
WBC UR: 1 /HPF (ref 0–5)

## 2011-08-30 LAB — BASIC METABOLIC PANEL
Anion Gap: 15 (ref 7–16)
BUN: 14 mg/dL (ref 7–18)
Calcium, Total: 8.9 mg/dL (ref 8.5–10.1)
Chloride: 103 mmol/L (ref 98–107)
Co2: 26 mmol/L (ref 21–32)
Creatinine: 1.03 mg/dL (ref 0.60–1.30)
EGFR (African American): >60
EGFR (Non-African Amer.): 56 — ABNORMAL LOW
Glucose: 162 mg/dL — ABNORMAL HIGH (ref 65–99)
Osmolality: 291 (ref 275–301)
Potassium: 3.6 mmol/L (ref 3.5–5.1)
Sodium: 144 mmol/L (ref 136–145)

## 2011-08-30 LAB — CBC
HCT: 40.9 % (ref 35.0–47.0)
HGB: 13.6 g/dL (ref 12.0–16.0)
MCH: 31.6 pg (ref 26.0–34.0)
MCHC: 33.2 g/dL (ref 32.0–36.0)
MCV: 95 fL (ref 80–100)
Platelet: 264 10*3/uL (ref 150–440)
RBC: 4.3 10*6/uL (ref 3.80–5.20)
RDW: 13.2 % (ref 11.5–14.5)
WBC: 9.9 10*3/uL (ref 3.6–11.0)

## 2011-09-01 ENCOUNTER — Telehealth: Payer: Self-pay | Admitting: Family Medicine

## 2011-09-01 NOTE — Telephone Encounter (Signed)
Left v/m at all contact #s for pt to call back. 

## 2011-09-01 NOTE — Telephone Encounter (Signed)
Advise her to go ahead and see the allergist Please call for that ER note- thanks Tell her to keep close track of everything she eats and drinks or any otc meds

## 2011-09-01 NOTE — Telephone Encounter (Signed)
Patient called to tell you that she went to [facility] ED on 08/29/2011, she broke out in hives starting itching, vomiting, and then trouble breathing. She took 2 benedryls and used her Epi-pen also. She went in the car to ER. The [facility] ER said severe allergic reaction. She had allergy testing at [facility] recently. She is going to call them today to set up an appt. They dont know if it was a medication reaction or not. Pls advise if she needs to see you or go to Allergist. Pls call pt to tell her what to do.

## 2011-09-01 NOTE — Telephone Encounter (Signed)
Left v/m for pt to call back. Faxed [facility] Medical records for Er record 08/29/11.

## 2011-09-02 NOTE — Telephone Encounter (Signed)
Patient notified as instructed by telephone. ER record is on your shelf in the in box.

## 2011-09-03 ENCOUNTER — Other Ambulatory Visit: Payer: Medicare Other

## 2011-09-10 ENCOUNTER — Encounter: Payer: Medicare Other | Admitting: Family Medicine

## 2011-09-14 ENCOUNTER — Emergency Department (HOSPITAL_COMMUNITY)
Admission: EM | Admit: 2011-09-14 | Discharge: 2011-09-14 | Disposition: A | Discharge status: Home / Self Care | Payer: Medicare Other | Attending: Emergency Medicine | Admitting: Emergency Medicine

## 2011-09-14 ENCOUNTER — Encounter (HOSPITAL_COMMUNITY): Payer: Self-pay | Admitting: *Deleted

## 2011-09-14 DIAGNOSIS — I2510 Atherosclerotic heart disease of native coronary artery without angina pectoris: Secondary | ICD-10-CM | POA: Insufficient documentation

## 2011-09-14 DIAGNOSIS — E119 Type 2 diabetes mellitus without complications: Secondary | ICD-10-CM | POA: Insufficient documentation

## 2011-09-14 DIAGNOSIS — Z79899 Other long term (current) drug therapy: Secondary | ICD-10-CM | POA: Insufficient documentation

## 2011-09-14 DIAGNOSIS — K219 Gastro-esophageal reflux disease without esophagitis: Secondary | ICD-10-CM | POA: Insufficient documentation

## 2011-09-14 DIAGNOSIS — R51 Headache: Secondary | ICD-10-CM

## 2011-09-14 DIAGNOSIS — E785 Hyperlipidemia, unspecified: Secondary | ICD-10-CM | POA: Insufficient documentation

## 2011-09-14 DIAGNOSIS — I10 Essential (primary) hypertension: Secondary | ICD-10-CM | POA: Insufficient documentation

## 2011-09-14 MED ORDER — HYDROCODONE-ACETAMINOPHEN 5-325 MG PO TABS
2.0000 | ORAL_TABLET | Freq: Once | ORAL | Status: AC
  Administered 2011-09-14: 2 via ORAL
  Filled 2011-09-14: qty 2

## 2011-09-14 MED ORDER — ONDANSETRON 4 MG PO TBDP
ORAL_TABLET | ORAL | Status: AC
  Filled 2011-09-14: qty 1

## 2011-09-14 MED ORDER — ONDANSETRON 4 MG PO TBDP
8.0000 mg | ORAL_TABLET | Freq: Once | ORAL | Status: AC
  Administered 2011-09-14: 8 mg via ORAL
  Filled 2011-09-14: qty 2

## 2011-09-14 NOTE — ED Notes (Signed)
Severe h/a lt. Side temporal. Same type of h/a in October 2012. bp 202/89 and took bp med. This morning.

## 2011-09-14 NOTE — ED Notes (Signed)
Pt requesting to have something to eat at this time. Okay to eat per Dr. Denton Lank - food provided to pt and spouse.

## 2011-09-14 NOTE — ED Notes (Signed)
Dr. Steinl at pt bedside. 

## 2011-09-14 NOTE — ED Notes (Signed)
Lights dimmed.  Reassurance given.  Family at bedside.

## 2011-09-14 NOTE — Discharge Instructions (Signed)
Take motrin or aleve as need for pain. Follow up with primary care doctor in the next couple days. Also, call the neurologist office Monday morning to see if they are able to move up your appointment sooner.  Return to ER if worse, severe headache, new symptoms,  fevers, persistent vomiting, numbness/weakness, change in speech/vision, other concern.   You were given pain medication in the ER - no driving for the next 6 hours.      Headache, General, Unknown Cause The specific cause of your headache may not have been found today. There are many causes and types of headache. A few common ones are:  Tension headache.   Migraine.   Infections (examples: dental and sinus infections).   Bone and/or joint problems in the neck or jaw.   Depression.   Eye problems.  These headaches are not life threatening.  Headaches can sometimes be diagnosed by a patient history and a physical exam. Sometimes, lab and imaging studies (such as x-ray and/or CT scan) are used to rule out more serious problems. In some cases, a spinal tap (lumbar puncture) may be requested. There are many times when your exam and tests may be normal on the first visit even when there is a serious problem causing your headaches. Because of that, it is very important to follow up with your doctor or local clinic for further evaluation. FINDING OUT THE RESULTS OF TESTS  If a radiology test was performed, a radiologist will review your results.   You will be contacted by the emergency department or your physician if any test results require a change in your treatment plan.   Not all test results may be available during your visit. If your test results are not back during the visit, make an appointment with your caregiver to find out the results. Do not assume everything is normal if you have not heard from your caregiver or the medical facility. It is important for you to follow up on all of your test results.  HOME CARE  INSTRUCTIONS   Keep follow-up appointments with your caregiver, or any specialist referral.   Only take over-the-counter or prescription medicines for pain, discomfort, or fever as directed by your caregiver.   Biofeedback, massage, or other relaxation techniques may be helpful.   Ice packs or heat applied to the head and neck can be used. Do this three to four times per day, or as needed.   Call your doctor if you have any questions or concerns.   If you smoke, you should quit.  SEEK MEDICAL CARE IF:   You develop problems with medications prescribed.   You do not respond to or obtain relief from medications.   You have a change from the usual headache.   You develop nausea or vomiting.  SEEK IMMEDIATE MEDICAL CARE IF:   If your headache becomes severe.   You have an unexplained oral temperature above 102 F (38.9 C), or as your caregiver suggests.   You have a stiff neck.   You have loss of vision.   You have muscular weakness.   You have loss of muscular control.   You develop severe symptoms different from your first symptoms.   You start losing your balance or have trouble walking.   You feel faint or pass out.  MAKE SURE YOU:   Understand these instructions.   Will watch your condition.   Will get help right away if you are not doing well or get worse.  Document Released: 06/23/2005 Document Revised: 06/12/2011 Document Reviewed: 02/10/2008 [facility] Patient Information 2012 [place].

## 2011-09-14 NOTE — ED Notes (Signed)
Ct scan 3-4 weeks ago. B/c of occipital h/as. Artery stenosis to a small artery on rt. Side of neck. Neg. Stroke scale.

## 2011-09-14 NOTE — ED Notes (Signed)
Pt dropped one of the zofran tabs as she was taking it.  Pill discarded and new one pulled from pyxis.

## 2011-09-14 NOTE — ED Provider Notes (Signed)
History     CSN: [id number]  Arrival date & time 09/14/11  [id number]   First MD Initiated Contact with Patient 09/14/11 [phone]      Chief Complaint  Patient presents with  . Headache    (Consider location/radiation/quality/duration/timing/severity/associated sxs/prior treatment) Patient is a 76 y.o. female presenting with headaches. The history is provided by the patient and the spouse.  Headache  Pertinent negatives include no fever, no shortness of breath and no vomiting.  pt notes gradual onset left sided headache this morning. States had similar headache yesterday morning. States saw Dr Milinda Antis for headaches in past month, similar to current. Also notes similar headaches in Oct 2012 w eval for same then. Current headache not a 'worst' headache, is consistent with prior. No nv.  No neck stiffness. No eye pain or change in vision. No change in speech. No numbness/weakness. No problems w balance or coordination. No sinus drainage or congestion. No fever or chills. No recent trauma or fall. States compliant w bp meds, no recent change in meds. Has taken no meds for pain since onset headache. States pcp has referred to neurology for evaluation headaches.   Past Medical History  Diagnosis Date  . Hypertension   . Diabetes mellitus     type II  . Hyperlipidemia     myalgias with Lipitor and Zetia  . GERD (gastroesophageal reflux disease)   . Skin cancer     hx of basal cell/ak's  . Colon polyps 2009  . Allergy history, drug     Aspirin  . Cervical stenosis of spine     With neck pain  . CAD (coronary artery disease) 04/08/11    non obst by cath  . Migraine     Past Surgical History  Procedure Date  . Appendectomy   . Tubal ligation     BTL  . Breast surgery     breast biopsy  . Eye surgery 2005    tear duct surgery  . Nasal sinus surgery 01/2005  . Cardiac catheterization 1993    LHC: normal coronaries.   . Stress cardiolite 11/1999    Normal/ negative  . Cystoscopy w/  decannulation 03/2000    Normal  . Shoulder surgery 10/2002    Impingement  . Abd [place] 07/2003    Negative  . Tear duct surgery 2005  . Colonoscopy 12/2007    Adenomatous colon polyps  . Cardiac catheterization 04/07/2011    non obst CAD (Dr Excell Seltzer)    Family History  Problem Relation Age of Onset  . Lung cancer Brother   . Colon cancer Neg Hx   . Colon polyps Other     History  Substance Use Topics  . Smoking status: Never Smoker   . Smokeless tobacco: Never Used  . Alcohol Use: No    OB History    Grav Para Term Preterm Abortions TAB SAB Ect Mult Living                  Review of Systems  Constitutional: Negative for fever and chills.  HENT: Negative for neck pain and neck stiffness.   Eyes: Negative for pain, redness and visual disturbance.  Respiratory: Negative for shortness of breath.   Cardiovascular: Negative for chest pain.  Gastrointestinal: Negative for vomiting and abdominal pain.  Genitourinary: Negative for flank pain.  Musculoskeletal: Negative for back pain.  Skin: Negative for rash.  Neurological: Positive for headaches. Negative for dizziness, speech difficulty, weakness and numbness.  Hematological: Does  not bruise/bleed easily.  Psychiatric/Behavioral: Negative for confusion.    Allergies  Bee venom; Nabumetone; Aspirin; Atorvastatin; Clopidogrel bisulfate; Codeine; Ezetimibe; EAV:[id number]+GNFAOZHYQ+MVHQIONGEX acid+aspartame; Metformin and related; and Valsartan  Home Medications   Current Outpatient Rx  Name Route Sig Dispense Refill  . CARVEDILOL 6.25 MG PO TABS Oral Take 6.25 mg by mouth 2 (two) times daily.    Marland Kitchen EPINEPHRINE 0.3 MG/0.3ML IJ DEVI Intramuscular Inject 0.3 mg into the muscle once.    Marland Kitchen GLIPIZIDE ER 5 MG PO TB24 Oral Take 5 mg by mouth daily.    Marland Kitchen OLMESARTAN MEDOXOMIL 40 MG PO TABS Oral Take 40 mg by mouth daily.    Marland Kitchen TICLOPIDINE HCL 250 MG PO TABS Oral Take 250 mg by mouth 2 (two) times daily.       BP 157/75  Pulse  65  Temp(Src) 98.2 F (36.8 C) (Oral)  Resp 22  SpO2 97%  Physical Exam  Nursing note and vitals reviewed. Constitutional: She is oriented to person, place, and time. She appears well-developed and well-nourished. No distress.  HENT:  Head: Atraumatic.       No sinus or temporal tenderness  Eyes: Conjunctivae and EOM are normal. Pupils are equal, round, and reactive to light. No scleral icterus.       Pupils briskly reactive, no corneal clouding.   Neck: Neck supple. No tracheal deviation present.       No stiffness or rigidity  Cardiovascular: Normal rate, regular rhythm, normal heart sounds and intact distal pulses.   Pulmonary/Chest: Effort normal and breath sounds normal. No respiratory distress.  Abdominal: Soft. Normal appearance. She exhibits no distension. There is no tenderness.  Musculoskeletal: She exhibits no edema.  Neurological: She is alert and oriented to person, place, and time. No cranial nerve deficit.       Motor intact bil. No facial asymmetry or droop. No pronator drift, steady gait.   Skin: Skin is warm and dry. No rash noted.    ED Course  Procedures (including critical care time)     MDM  No meds for pain pta. Hydrocodone po. zofran po. Reviewed charts. Several head cts in past 6 months for headache eval, neg for acute process.   Recheck headache improved. No nv. No neuro c/o.   Recheck bp 142/65.       Suzi Roots, MD 09/14/11 1106

## 2011-09-16 ENCOUNTER — Telehealth: Payer: Self-pay | Admitting: Family Medicine

## 2011-09-16 NOTE — Telephone Encounter (Signed)
Error

## 2011-09-19 ENCOUNTER — Ambulatory Visit (INDEPENDENT_AMBULATORY_CARE_PROVIDER_SITE_OTHER): Payer: Medicare Other | Admitting: Family Medicine

## 2011-09-19 ENCOUNTER — Encounter: Payer: Self-pay | Admitting: Family Medicine

## 2011-09-19 VITALS — BP 128/70 | HR 68 | Temp 98.2°F | Ht 69.0 in | Wt 202.2 lb

## 2011-09-19 DIAGNOSIS — R519 Headache, unspecified: Secondary | ICD-10-CM

## 2011-09-19 DIAGNOSIS — R51 Headache: Secondary | ICD-10-CM

## 2011-09-19 DIAGNOSIS — I10 Essential (primary) hypertension: Secondary | ICD-10-CM

## 2011-09-19 NOTE — Progress Notes (Signed)
Subjective:    Patient ID: Margaret Vazquez, female    DOB: 03-Apr-1936, 76 y.o.   MRN: [id number]  HPI Here for f/u of ER visit on 3/10 for headache and increased bp  No headache since ER visit  Given hydrocodone and zofran and got it better     Per pt bp went up to 205/89  Left ER was 142/69  Ct head in feb nl with atrophy Had MRA in past Has seen Dr Pearlean Brownie- was supposed to see him in oct but could not get in   Has appt April 15th with him   Here bp is 128/70 bp at home has been incredibly labile with 150s-80s    (once was down to 105/55)    Migraines she had when young -much diff Used to have numbness and vis change  Now really feels like it is pressure /pain on L posterior head -usually  Not throbbing  Dull ache in general  No numbness or tingling or facial droop No nausea or vision change No photo or phono phobia   Patient Active Problem List  Diagnoses  . DIABETES MELLITUS, TYPE II  . VITAMIN B12 DEFICIENCY  . HYPERLIPIDEMIA  . UNSPECIFIED ANEMIA  . HYPERTENSION, ESSENTIAL NOS  . ALLERGIC RHINITIS  . GERD  . OVERACTIVE BLADDER  . BACK PAIN, LUMBAR  . INCONTINENCE, URGE  . SKIN CANCER, HX OF  . PERSONAL HX COLONIC POLYPS  . Urticaria  . Leg pain, bilateral  . Chest pain  . Abnormal urine odor  . Diarrhea  . UTI (lower urinary tract infection)  . Headache in back of head   Past Medical History  Diagnosis Date  . Hypertension   . Diabetes mellitus     type II  . Hyperlipidemia     myalgias with Lipitor and Zetia  . GERD (gastroesophageal reflux disease)   . Skin cancer     hx of basal cell/ak's  . Colon polyps 2009  . Allergy history, drug     Aspirin  . Cervical stenosis of spine     With neck pain  . CAD (coronary artery disease) 04/08/11    non obst by cath  . Migraine    Past Surgical History  Procedure Date  . Appendectomy   . Tubal ligation     BTL  . Breast surgery     breast biopsy  . Eye surgery 2005    tear duct surgery  .  Nasal sinus surgery 01/2005  . Cardiac catheterization 1993    LHC: normal coronaries.   . Stress cardiolite 11/1999    Normal/ negative  . Cystoscopy w/ decannulation 03/2000    Normal  . Shoulder surgery 10/2002    Impingement  . Abd [place] 07/2003    Negative  . Tear duct surgery 2005  . Colonoscopy 12/2007    Adenomatous colon polyps  . Cardiac catheterization 04/07/2011    non obst CAD (Dr Excell Seltzer)   History  Substance Use Topics  . Smoking status: Never Smoker   . Smokeless tobacco: Never Used  . Alcohol Use: No   Family History  Problem Relation Age of Onset  . Lung cancer Brother   . Colon cancer Neg Hx   . Colon polyps Other    Allergies  Allergen Reactions  . Bee Venom Hives, Shortness Of Breath and Swelling  . Nabumetone Anaphylaxis  . Aspirin Hives  . Atorvastatin Swelling     joint pain/swelling, inc liver tests  .  Clopidogrel Bisulfate Hives  . Codeine Nausea And Vomiting  . Ezetimibe Other (See Comments)     fatigue  . MWU:[id number]+ZDGUYQIHK+VQQVZDGLOV Acid+Aspartame Hives and Swelling    To lips.  . Metformin And Related Other (See Comments)    Diarrhea   . Valsartan Other (See Comments)     fatigue   Current Outpatient Prescriptions on File Prior to Visit  Medication Sig Dispense Refill  . carvedilol (COREG) 6.25 MG tablet Take 6.25 mg by mouth 2 (two) times daily.      Marland Kitchen glipiZIDE (GLUCOTROL XL) 5 MG 24 hr tablet Take 5 mg by mouth daily.      Marland Kitchen olmesartan (BENICAR) 40 MG tablet Take 40 mg by mouth daily.      . ticlopidine (TICLID) 250 MG tablet Take 250 mg by mouth 2 (two) times daily.       Marland Kitchen EPINEPHrine (EPI-PEN) 0.3 mg/0.3 mL DEVI Inject 0.3 mg into the muscle once.         Review of Systems Review of Systems  Constitutional: Negative for fever, appetite change, fatigue and unexpected weight change.  Eyes: Negative for pain and visual disturbance.  Respiratory: Negative for cough and shortness of breath.   Cardiovascular: Negative for  cp or palpitations    Gastrointestinal: Negative for nausea, diarrhea and constipation.  Genitourinary: Negative for urgency and frequency.  Skin: Negative for pallor or rash   Neurological: Negative for weakness, light-headedness, numbness and pos for headaches Hematological: Negative for adenopathy. Does not bruise/bleed easily.  Psychiatric/Behavioral: Negative for dysphoric mood. The patient is not nervous/anxious.          Objective:   Physical Exam  Constitutional: She appears well-developed and well-nourished. No distress.       overwt and well appearing   HENT:  Head: Normocephalic and atraumatic.  Mouth/Throat: Oropharynx is clear and moist.  Eyes: Conjunctivae and EOM are normal. Pupils are equal, round, and reactive to light. No scleral icterus.  Neck: Normal range of motion. Neck supple. No JVD present. Carotid bruit is not present. No thyromegaly present.  Cardiovascular: Normal rate, regular rhythm, normal heart sounds and intact distal pulses.  Exam reveals no gallop.   Pulmonary/Chest: Effort normal and breath sounds normal. No respiratory distress. She has no wheezes.  Abdominal: Soft. Bowel sounds are normal. She exhibits no distension, no abdominal bruit and no mass. There is no tenderness.  Musculoskeletal: Normal range of motion. She exhibits no edema and no tenderness.  Lymphadenopathy:    She has no cervical adenopathy.  Neurological: She is alert. She has normal strength and normal reflexes. She displays no atrophy and no tremor. No cranial nerve deficit or sensory deficit. She exhibits normal muscle tone. Coordination and gait normal.       No facial droop Speech normal No focal cerebellar changes   Skin: Skin is warm and dry. No rash noted. No erythema. No pallor.  Psychiatric: She has a normal mood and affect.          Assessment & Plan:

## 2011-09-19 NOTE — Assessment & Plan Note (Addendum)
Intermittent in pt with hx of pre menopausal migraine (different type of pain) CT ok in fall , MRA in 2011 Has seen Dr Pearlean Brownie in past - for dizziness (which is better) Often correlates with spikes in bp  Multiple hosp visits Rev most recent one in detail / labs/studies  Ref to neurology for further eval

## 2011-09-19 NOTE — Patient Instructions (Addendum)
I'm glad your blood pressure is good today- keep tracking it  Please keep me updated about the headaches  We will refer you to neurology- Dr Modesto Charon - at check out  No change in medicines  Try to avoid salt in diet  Make sure you get enough water intake  Avoid caffeine  Stay active

## 2011-09-19 NOTE — Assessment & Plan Note (Signed)
bp is fine now - but intermittently spikes- also in conj with headache (? What causes what) Is routinely ok in office She will get a new meter at home Will address headaches with neurology

## 2011-10-06 LAB — HM PAP SMEAR: HM Pap smear: NORMAL

## 2011-10-17 ENCOUNTER — Other Ambulatory Visit: Payer: Medicare Other

## 2011-10-20 ENCOUNTER — Other Ambulatory Visit (INDEPENDENT_AMBULATORY_CARE_PROVIDER_SITE_OTHER): Payer: Medicare Other

## 2011-10-20 DIAGNOSIS — E785 Hyperlipidemia, unspecified: Secondary | ICD-10-CM

## 2011-10-20 DIAGNOSIS — I10 Essential (primary) hypertension: Secondary | ICD-10-CM

## 2011-10-20 DIAGNOSIS — E119 Type 2 diabetes mellitus without complications: Secondary | ICD-10-CM

## 2011-10-20 LAB — LIPID PANEL
Cholesterol: 211 mg/dL — ABNORMAL HIGH (ref 0–200)
HDL: 45.3 mg/dL (ref >39.00)
Total CHOL/HDL Ratio: 5
Triglycerides: 141 mg/dL (ref 0.0–149.0)
VLDL: 28.2 mg/dL (ref 0.0–40.0)

## 2011-10-20 LAB — COMPREHENSIVE METABOLIC PANEL
ALT: 14 U/L (ref 0–35)
AST: 13 U/L (ref 0–37)
Albumin: 3.9 g/dL (ref 3.5–5.2)
Alkaline Phosphatase: 65 U/L (ref 39–117)
BUN: 15 mg/dL (ref 6–23)
CO2: 27 mEq/L (ref 19–32)
Calcium: 8.9 mg/dL (ref 8.4–10.5)
Chloride: 108 mEq/L (ref 96–112)
Creatinine, Ser: 1 mg/dL (ref 0.4–1.2)
GFR: 58.27 mL/min — ABNORMAL LOW (ref >60.00)
Glucose, Bld: 106 mg/dL — ABNORMAL HIGH (ref 70–99)
Potassium: 4.4 mEq/L (ref 3.5–5.1)
Sodium: 142 mEq/L (ref 135–145)
Total Bilirubin: 0.4 mg/dL (ref 0.3–1.2)
Total Protein: 6.9 g/dL (ref 6.0–8.3)

## 2011-10-20 LAB — LDL CHOLESTEROL, DIRECT: Direct LDL: 136.5 mg/dL

## 2011-10-20 LAB — HEMOGLOBIN A1C: Hgb A1c MFr Bld: 6.6 % — ABNORMAL HIGH (ref 4.6–6.5)

## 2011-10-22 ENCOUNTER — Other Ambulatory Visit: Payer: Medicare Other

## 2011-10-24 ENCOUNTER — Other Ambulatory Visit: Payer: Medicare Other

## 2011-10-27 ENCOUNTER — Other Ambulatory Visit: Payer: Self-pay | Admitting: Family Medicine

## 2011-10-27 ENCOUNTER — Other Ambulatory Visit: Payer: Self-pay | Admitting: *Deleted

## 2011-10-27 NOTE — Telephone Encounter (Signed)
Please check in with pt - I think she came off the crestor due to intolerance this winter If so - deny the px thanks

## 2011-10-27 NOTE — Telephone Encounter (Signed)
Rx for Crestor denied, spoke with patient via telephone and she stated that she no longer takes Crestor.

## 2011-10-27 NOTE — Telephone Encounter (Signed)
This was not on the patient's current medication list.  Is it okay to refill?  Directions from pharmacy state:  Take 1 tablet by mouth every other day.  Please advise.

## 2011-10-28 ENCOUNTER — Ambulatory Visit (INDEPENDENT_AMBULATORY_CARE_PROVIDER_SITE_OTHER): Payer: Medicare Other | Admitting: Family Medicine

## 2011-10-28 ENCOUNTER — Encounter: Payer: Self-pay | Admitting: Family Medicine

## 2011-10-28 VITALS — BP 130/80 | HR 61 | Temp 98.2°F | Ht 69.0 in | Wt 204.8 lb

## 2011-10-28 DIAGNOSIS — Z780 Asymptomatic menopausal state: Secondary | ICD-10-CM

## 2011-10-28 DIAGNOSIS — Z23 Encounter for immunization: Secondary | ICD-10-CM

## 2011-10-28 DIAGNOSIS — E785 Hyperlipidemia, unspecified: Secondary | ICD-10-CM

## 2011-10-28 DIAGNOSIS — E119 Type 2 diabetes mellitus without complications: Secondary | ICD-10-CM

## 2011-10-28 DIAGNOSIS — I10 Essential (primary) hypertension: Secondary | ICD-10-CM

## 2011-10-28 NOTE — Telephone Encounter (Signed)
Left message on machine at home for patient to return call. 

## 2011-10-28 NOTE — Assessment & Plan Note (Signed)
Lipids stable Pt intol statins Disc goals for lipids and reasons to control them Rev labs with pt Rev low sat fat diet in detail

## 2011-10-28 NOTE — Assessment & Plan Note (Signed)
Improved bp  No changes  Saw neuro- no reason for rapid changes Ha better Continue to follow

## 2011-10-28 NOTE — Assessment & Plan Note (Signed)
Well controlled with glipizide alone and diet opthy utd Good foot exam On ARB F/u 29mo

## 2011-10-28 NOTE — Progress Notes (Signed)
Subjective:    Patient ID: Margaret Vazquez, female    DOB: February 19, 1936, 76 y.o.   MRN: [id number]  HPI Here for check up of chronic medical conditions and to review health mt list    Doing better - no headache and bp is good  No more episodes   bp is 130/80 Wt is up 2 lb with bmi of 30  Diabetes Home sugar results - sugars running 120s-130s highest  DM diet - is very good with that / low glycemic  Exercise - has been doing some exercise , does swell with warmer weather  Symptoms-- none  A1C last  6.6 in good control  No problems with medications - glipizide (works better than metformin)  Renal protection-on ARB Last eye exam - 2 mo ago - no retinop  Lab Results  Component Value Date   CHOL 211* 10/20/2011   CHOL 207* 04/08/2011   CHOL 152 11/27/2010   Lab Results  Component Value Date   HDL 45.30 10/20/2011   HDL 46 04/08/2011   HDL 45.90 11/27/2010   Lab Results  Component Value Date   LDLCALC 127* 04/08/2011   LDLCALC 78 11/27/2010   LDLCALC 89 05/27/2010   Lab Results  Component Value Date   TRIG 141.0 10/20/2011   TRIG 171* 04/08/2011   TRIG 142.0 11/27/2010   Lab Results  Component Value Date   CHOLHDL 5 10/20/2011   CHOLHDL 4.5 04/08/2011   CHOLHDL 3 11/27/2010   Lab Results  Component Value Date   LDLDIRECT 136.5 10/20/2011   LDLDIRECT 134.7 08/08/2008   LDLDIRECT 143.0 04/27/2007   fairly stable Intol of statins  Diet -- stays away from red meat/ fried/ high fat dairy -- eats fish and chicken (bake or broil)   Zoster status -has not had the vaccine   Td 99- is due for that- will get today   colonosc 6/09 polyps-- recall is 2014 No bowel changes   Saw Dr Luella Cook 2 weeks ago - had pelvic exam -- had hyperplasia years ago  mammo 1/ 13 was normal , no lumps on self exam   Bone density Never had one before  Takes ca and D  No fracture hx  Some family history  Patient Active Problem List  Diagnoses  . DIABETES MELLITUS, TYPE II  . VITAMIN B12  DEFICIENCY  . HYPERLIPIDEMIA  . UNSPECIFIED ANEMIA  . HYPERTENSION, ESSENTIAL NOS  . ALLERGIC RHINITIS  . GERD  . OVERACTIVE BLADDER  . BACK PAIN, LUMBAR  . INCONTINENCE, URGE  . SKIN CANCER, HX OF  . PERSONAL HX COLONIC POLYPS  . Urticaria  . Leg pain, bilateral  . Chest pain  . Abnormal urine odor  . Diarrhea  . UTI (lower urinary tract infection)  . Headache in back of head  . Post-menopausal   Past Medical History  Diagnosis Date  . Hypertension   . Diabetes mellitus     type II  . Hyperlipidemia     myalgias with Lipitor and Zetia  . GERD (gastroesophageal reflux disease)   . Skin cancer     hx of basal cell/ak's  . Colon polyps 2009  . Allergy history, drug     Aspirin  . Cervical stenosis of spine     With neck pain  . CAD (coronary artery disease) 04/08/11    non obst by cath  . Migraine    Past Surgical History  Procedure Date  . Appendectomy   . Tubal ligation  BTL  . Breast surgery     breast biopsy  . Eye surgery 2005    tear duct surgery  . Nasal sinus surgery 01/2005  . Cardiac catheterization 1993    LHC: normal coronaries.   . Stress cardiolite 11/1999    Normal/ negative  . Cystoscopy w/ decannulation 03/2000    Normal  . Shoulder surgery 10/2002    Impingement  . Abd [place] 07/2003    Negative  . Tear duct surgery 2005  . Colonoscopy 12/2007    Adenomatous colon polyps  . Cardiac catheterization 04/07/2011    non obst CAD (Dr Excell Seltzer)   History  Substance Use Topics  . Smoking status: Never Smoker   . Smokeless tobacco: Never Used  . Alcohol Use: No   Family History  Problem Relation Age of Onset  . Lung cancer Brother   . Colon cancer Neg Hx   . Colon polyps Other    Allergies  Allergen Reactions  . Bee Venom Hives, Shortness Of Breath and Swelling  . Nabumetone Anaphylaxis  . Aspirin Hives  . Atorvastatin Swelling     joint pain/swelling, inc liver tests  . Clopidogrel Bisulfate Hives  . Codeine Nausea And Vomiting    . Ezetimibe Other (See Comments)     fatigue  . ZOX:[id number] Acid+Aspartame Hives and Swelling    To lips.  . Metformin And Related Other (See Comments)    Diarrhea   . Valsartan Other (See Comments)     fatigue   Current Outpatient Prescriptions on File Prior to Visit  Medication Sig Dispense Refill  . carvedilol (COREG) 6.25 MG tablet Take 6.25 mg by mouth 2 (two) times daily.      Marland Kitchen EPINEPHrine (EPI-PEN) 0.3 mg/0.3 mL DEVI Inject 0.3 mg into the muscle once.      Marland Kitchen glipiZIDE (GLUCOTROL XL) 5 MG 24 hr tablet Take 5 mg by mouth daily.      Marland Kitchen olmesartan (BENICAR) 40 MG tablet Take 40 mg by mouth daily.      . ticlopidine (TICLID) 250 MG tablet Take 250 mg by mouth 2 (two) times daily.            Review of Systems Review of Systems  Constitutional: Negative for fever, appetite change, fatigue and unexpected weight change.  Eyes: Negative for pain and visual disturbance.  Respiratory: Negative for cough and shortness of breath.   Cardiovascular: Negative for cp or palpitations    Gastrointestinal: Negative for nausea, diarrhea and constipation.  Genitourinary: Negative for urgency and frequency.  Skin: Negative for pallor or rash   Neurological: Negative for weakness, light-headedness, numbness and headaches.  Hematological: Negative for adenopathy. Does not bruise/bleed easily.  Psychiatric/Behavioral: Negative for dysphoric mood. The patient is not nervous/anxious.          Objective:   Physical Exam  Constitutional: She appears well-developed and well-nourished. No distress.  HENT:  Head: Normocephalic and atraumatic.  Right Ear: External ear normal.  Left Ear: External ear normal.  Mouth/Throat: Oropharynx is clear and moist.  Eyes: Conjunctivae and EOM are normal. Pupils are equal, round, and reactive to light. No scleral icterus.  Neck: Normal range of motion. Neck supple. No JVD present. No thyromegaly present.  Cardiovascular: Normal rate,  regular rhythm, normal heart sounds and intact distal pulses.  Exam reveals no gallop.   Pulmonary/Chest: Effort normal and breath sounds normal. No respiratory distress. She has no wheezes.  Abdominal: Soft. Bowel sounds are normal. She exhibits no distension.  There is no tenderness.  Musculoskeletal: She exhibits no edema and no tenderness.  Lymphadenopathy:    She has no cervical adenopathy.  Neurological: She is alert. She has normal reflexes. No cranial nerve deficit. She exhibits normal muscle tone. Coordination normal.  Skin: Skin is warm and dry. No rash noted. No erythema. No pallor.  Psychiatric: She has a normal mood and affect.          Assessment & Plan:

## 2011-10-28 NOTE — Assessment & Plan Note (Signed)
Schedule dexa if covered  Has fam hx of OP also  Rev need for ca and D and exercise

## 2011-10-28 NOTE — Patient Instructions (Signed)
We will schedule bone density test at check out if your insurance covers it  If you are interested in a shingles/zoster vaccine - call your insurance to check on coverage,( you should not get it within 1 month of other vaccines) , then call us for a prescription  for it to take to a pharmacy that gives the shot   tetnus shot today  Try to get 1200-1500 mg of calcium per day with at least 1000 iu of vitamin D - for bone health  No change in medicines

## 2011-11-03 ENCOUNTER — Telehealth: Payer: Self-pay

## 2011-11-03 NOTE — Telephone Encounter (Signed)
Patient notified as instructed by telephone. 

## 2011-11-03 NOTE — Telephone Encounter (Signed)
Pt seen 10/28/11 and received tetanus injection. Wed  And Clovis Cao was sore to touch but Fri knot size of 50 cent piece where injection given, rash and redness with itching size of an orange at injection site with puffiness and warm to touch started over weekend(pt out of town when symptoms began/ pt back home now). Pt said still red, warm to touch and some swelling. Pt using Benadryl cream which helped itching. No fever noted. Pt using [facility] and can be reached [phone].

## 2011-11-03 NOTE — Telephone Encounter (Signed)
Left message on machine to call back  

## 2011-11-03 NOTE — Telephone Encounter (Signed)
Spoke to patient and was advised that she is not on Crestor any longer and this was a mix up at the pharmacy. Patient stated to disregard the request for Crestor because this has already been cleared up with the pharmacy.

## 2011-11-03 NOTE — Telephone Encounter (Signed)
Can use warm or cool compress (whichever feels better ) - and keep me updated - expect continued improvement , but if fever/sob or other symptoms alert [place]

## 2011-11-04 ENCOUNTER — Other Ambulatory Visit: Payer: Medicare Other

## 2011-11-05 ENCOUNTER — Ambulatory Visit
Admission: RE | Admit: 2011-11-05 | Discharge: 2011-11-05 | Disposition: A | Discharge status: Home / Self Care | Payer: Medicare Other | Source: Ambulatory Visit | Attending: Family Medicine | Admitting: Family Medicine

## 2011-11-05 DIAGNOSIS — Z780 Asymptomatic menopausal state: Secondary | ICD-10-CM

## 2011-11-07 ENCOUNTER — Ambulatory Visit: Payer: Medicare Other | Admitting: Neurology

## 2011-11-09 LAB — HM DEXA SCAN: HM Dexa Scan: NORMAL

## 2011-11-10 ENCOUNTER — Encounter: Payer: Self-pay | Admitting: Family Medicine

## 2011-11-10 ENCOUNTER — Encounter: Payer: Self-pay | Admitting: *Deleted

## 2011-11-24 ENCOUNTER — Other Ambulatory Visit: Payer: Self-pay | Admitting: Family Medicine

## 2011-12-15 ENCOUNTER — Ambulatory Visit (INDEPENDENT_AMBULATORY_CARE_PROVIDER_SITE_OTHER): Payer: Medicare Other | Admitting: Family Medicine

## 2011-12-15 ENCOUNTER — Encounter: Payer: Self-pay | Admitting: Family Medicine

## 2011-12-15 VITALS — BP 140/80 | HR 64 | Temp 97.9°F | Ht 69.0 in | Wt 202.2 lb

## 2011-12-15 DIAGNOSIS — Z860100 Personal history of colon polyps, unspecified: Secondary | ICD-10-CM

## 2011-12-15 DIAGNOSIS — R159 Full incontinence of feces: Secondary | ICD-10-CM

## 2011-12-15 NOTE — Progress Notes (Signed)
Subjective:    Patient ID: Margaret Vazquez, female    DOB: [birth year], 76 y.o.   MRN: [id number]  HPI Pt here with encopresis - is 76 yo female/ obese/ hx of DM and colon polyps  For the past 3 weeks - has been having leakage of bms -- when she moves or change position or when she urinates Was doing ok previously after stopping metformin Has to wear a pad now   When she has regular BM 1-2 is soft (not diarrhea like with the metformin  Also feels something when she wipes   Diet has not changed  No abdominal cramping   Last colonosc 6/09 - polyps  5 year recall for that   No back pain or n/t/weakness of legs This has never happened before   Patient Active Problem List  Diagnoses  . DIABETES MELLITUS, TYPE II  . VITAMIN B12 DEFICIENCY  . HYPERLIPIDEMIA  . UNSPECIFIED ANEMIA  . HYPERTENSION, ESSENTIAL NOS  . ALLERGIC RHINITIS  . GERD  . OVERACTIVE BLADDER  . BACK PAIN, LUMBAR  . INCONTINENCE, URGE  . SKIN CANCER, HX OF  . PERSONAL HX COLONIC POLYPS  . Urticaria  . Leg pain, bilateral  . Chest pain  . Abnormal urine odor  . Diarrhea  . UTI (lower urinary tract infection)  . Headache in back of head  . Post-menopausal   Past Medical History  Diagnosis Date  . Hypertension   . Diabetes mellitus     type II  . Hyperlipidemia     myalgias with Lipitor and Zetia  . GERD (gastroesophageal reflux disease)   . Skin cancer     hx of basal cell/ak's  . Colon polyps 2009  . Allergy history, drug     Aspirin  . Cervical stenosis of spine     With neck pain  . CAD (coronary artery disease) 04/08/11    non obst by cath  . Migraine    Past Surgical History  Procedure Date  . Appendectomy   . Tubal ligation     BTL  . Breast surgery     breast biopsy  . Eye surgery 2005    tear duct surgery  . Nasal sinus surgery 01/2005  . Cardiac catheterization 1993    LHC: normal coronaries.   . Stress cardiolite 11/1999    Normal/ negative  . Cystoscopy w/ decannulation  03/2000    Normal  . Shoulder surgery 10/2002    Impingement  . Abd [place] 07/2003    Negative  . Tear duct surgery 2005  . Colonoscopy 12/2007    Adenomatous colon polyps  . Cardiac catheterization 04/07/2011    non obst CAD (Dr Burt Knack)   History  Substance Use Topics  . Smoking status: Never Smoker   . Smokeless tobacco: Never Used  . Alcohol Use: No   Family History  Problem Relation Age of Onset  . Lung cancer Brother   . Colon cancer Neg Hx   . Colon polyps Other    Allergies  Allergen Reactions  . Bee Venom Hives, Shortness Of Breath and Swelling  . Nabumetone Anaphylaxis  . Amoxicillin-Pot Clavulanate Hives and Swelling    To lips.  . Aspirin Hives  . Atorvastatin Swelling     joint pain/swelling, inc liver tests  . Clopidogrel Bisulfate Hives  . Codeine Nausea And Vomiting  . Ezetimibe Other (See Comments)     fatigue  . Metformin And Related Other (See Comments)    Diarrhea   .  Valsartan Other (See Comments)     fatigue   Current Outpatient Prescriptions on File Prior to Visit  Medication Sig Dispense Refill  . BENICAR 40 MG tablet TAKE ONE TABLET BY MOUTH EVERY DAY  30 each  11  . carvedilol (COREG) 6.25 MG tablet Take 6.25 mg by mouth 2 (two) times daily.      Marland Kitchen EPINEPHrine (EPI-PEN) 0.3 mg/0.3 mL DEVI Inject 0.3 mg into the muscle once.      Marland Kitchen glipiZIDE (GLUCOTROL XL) 5 MG 24 hr tablet Take 5 mg by mouth daily.      . ticlopidine (TICLID) 250 MG tablet Take 250 mg by mouth 2 (two) times daily.       Marland Kitchen DISCONTD: olmesartan (BENICAR) 40 MG tablet Take 40 mg by mouth daily.         Review of Systems Review of Systems  Constitutional: Negative for fever, appetite change, fatigue and unexpected weight change.  Eyes: Negative for pain and visual disturbance.  Respiratory: Negative for cough and shortness of breath.   Cardiovascular: Negative for cp or palpitations    Gastrointestinal: Negative for nausea, diarrhea and constipation. pos for fecal  incontinence, neg for abd pain or blood in stool  Genitourinary: Negative for urgency and frequency.  Skin: Negative for pallor or rash   MSK neg for back pain  Neurological: Negative for weakness, light-headedness, numbness and headaches.  Hematological: Negative for adenopathy. Does not bruise/bleed easily.  Psychiatric/Behavioral: Negative for dysphoric mood. The patient is not nervous/anxious.         Objective:   Physical Exam  Constitutional: She appears well-developed and well-nourished. No distress.       Obese and well appearing   HENT:  Head: Normocephalic and atraumatic.  Eyes: Conjunctivae and EOM are normal. Pupils are equal, round, and reactive to light. No scleral icterus.  Neck: Normal range of motion. Neck supple.  Cardiovascular: Normal rate and regular rhythm.   Pulmonary/Chest: Effort normal and breath sounds normal.  Abdominal: Soft. Bowel sounds are normal.  Genitourinary: Rectal exam shows anal tone abnormal. Rectal exam shows no external hemorrhoid, no fissure, no mass and no tenderness.       Heme neg stool   Mildly poor anal sphincter tone   Musculoskeletal: Normal range of motion. She exhibits no edema and no tenderness.       No LS tenderness  Lymphadenopathy:    She has no cervical adenopathy.  Neurological: She is alert. She has normal reflexes. She displays no atrophy and no tremor. No cranial nerve deficit. She exhibits normal muscle tone. Coordination and gait normal.  Skin: Skin is warm and dry. No rash noted. No erythema. No pallor.  Psychiatric: She has a normal mood and affect.          Assessment & Plan:

## 2011-12-15 NOTE — Assessment & Plan Note (Signed)
Pt has 3 week hx of encopresis - without other symptoms  Diarrhea in past with metformin that is now better  Seems to have somewhat poor anal tone on exam (otherwise reassuring) with heme neg stool Adv to start fiber supplement daily to bulk up stool Ref to GI for further eval

## 2011-12-15 NOTE — Patient Instructions (Signed)
Start a fiber supplement every day with water like citrucel or metamucil  We will do a referral to GI at check out

## 2011-12-29 ENCOUNTER — Ambulatory Visit (INDEPENDENT_AMBULATORY_CARE_PROVIDER_SITE_OTHER): Payer: Medicare Other | Admitting: Gastroenterology

## 2011-12-29 ENCOUNTER — Encounter: Payer: Self-pay | Admitting: Gastroenterology

## 2011-12-29 VITALS — BP 150/80 | HR 70 | Ht 69.0 in | Wt 201.0 lb

## 2011-12-29 DIAGNOSIS — R197 Diarrhea, unspecified: Secondary | ICD-10-CM

## 2011-12-29 NOTE — Progress Notes (Signed)
Review of gastrointestinal problems:  1. Adenomatous colon polyps, removed during colonoscopy June 2009. Next colonoscopy at five-year interval 2. several months of diarrhea, possibly related to metformin, symptoms improved after stopping the drug);  January 2012; stool test positive for fecal lactoferrin, negative for Clostridium difficile, ova parasites, routine culture.   HPI: This is a  very pleasant 76 year old woman whom I last saw about a year and a half ago.  I suspected she was having a medication related diarrhea either or Ticlid or metformin.  Last year she stopped metformin and bowels improved, became much more solid.  She noticed immediate improvement.  WAs great for months until 3-4 week ago.  NO recent abx.  Diarrhea started, several times a day.  INcontinent at times, has had to wear a pad.  No med changes, no pain.  No bleeding.     Past Medical History  Diagnosis Date  . Hypertension   . Diabetes mellitus     type II  . Hyperlipidemia     myalgias with Lipitor and Zetia  . GERD (gastroesophageal reflux disease)   . Skin cancer     hx of basal cell/ak's  . Colon polyps 2009  . Allergy history, drug     Aspirin  . Cervical stenosis of spine     With neck pain  . CAD (coronary artery disease) 04/08/11    non obst by cath  . Migraine     Past Surgical History  Procedure Date  . Appendectomy   . Tubal ligation     BTL  . Breast surgery     breast biopsy  . Eye surgery 2005    tear duct surgery  . Nasal sinus surgery 01/2005  . Cardiac catheterization 1993    LHC: normal coronaries.   . Stress cardiolite 11/1999    Normal/ negative  . Cystoscopy w/ decannulation 03/2000    Normal  . Shoulder surgery 10/2002    Impingement  . Abd [place] 07/2003    Negative  . Tear duct surgery 2005  . Colonoscopy 12/2007    Adenomatous colon polyps  . Cardiac catheterization 04/07/2011    non obst CAD (Dr Excell Seltzer)    Current Outpatient Prescriptions  Medication Sig  Dispense Refill  . BENICAR 40 MG tablet TAKE ONE TABLET BY MOUTH EVERY DAY  30 each  11  . carvedilol (COREG) 6.25 MG tablet Take 6.25 mg by mouth 2 (two) times daily.      Marland Kitchen EPINEPHrine (EPI-PEN) 0.3 mg/0.3 mL DEVI Inject 0.3 mg into the muscle once.      Marland Kitchen glipiZIDE (GLUCOTROL XL) 5 MG 24 hr tablet Take 5 mg by mouth daily.      . ticlopidine (TICLID) 250 MG tablet Take 250 mg by mouth 2 (two) times daily.         Allergies as of 12/29/2011 - Review Complete 12/29/2011  Allergen Reaction Noted  . Bee venom Hives, Shortness Of Breath, and Swelling 12/20/2010  . Nabumetone Anaphylaxis 11/24/2006  . Amoxicillin-pot clavulanate Hives and Swelling 05/29/2008  . Aspirin Hives   . Atorvastatin Swelling 11/24/2006  . Clopidogrel bisulfate Hives 01/01/2010  . Codeine Nausea And Vomiting 11/24/2006  . Ezetimibe Other (See Comments) 11/24/2006  . Metformin and related Other (See Comments) 08/07/2011  . Valsartan Other (See Comments) 12/04/2009    Family History  Problem Relation Age of Onset  . Lung cancer Brother   . Colon cancer Neg Hx   . Skin cancer Daughter   .  Diabetes Sister   . Diabetes Brother     History   Social History  . Marital Status: Married    Spouse Name: N/A    Number of Children: 3  . Years of Education: N/A   Occupational History  . Retired    Social History Main Topics  . Smoking status: Never Smoker   . Smokeless tobacco: Never Used  . Alcohol Use: No  . Drug Use: No  . Sexually Active: Not on file   Other Topics Concern  . Not on file   Social History Narrative   3 childrenDoes not drink caffeinated beveragesCares for SIL with dementia      Physical Exam: BP 150/80  Pulse 70  Ht 5\' 9"  (1.753 m)  Wt 201 lb (91.173 kg)  BMI 29.68 kg/m2 Constitutional: generally well-appearing Psychiatric: alert and oriented x3 Abdomen: soft, nontender, nondistended, no obvious ascites, no peritoneal signs, normal bowel sounds     Assessment and  plan: 76 y.o. female with 3-4 weeks of nonbloody diarrhea, clear change in her bowels.  Infectious, inflammatory, neoplastic? She is going to have stool testing done today, see below. She's going to start Imodium one pill twice daily and when her stool tests return we will get in touch with her. If she is still bothered by her bowel change then she'll need repeat colonoscopy. She is due in about a year anyway for polyp surveillance.

## 2011-12-29 NOTE — Patient Instructions (Addendum)
Take imodium 1 pill every morning shortly after waking.  You can take a second imodium later if you need it. You will have labs checked today in the basement lab.  Please head down after you check out with the front desk  (c. Diff by PCR, stool culture, ova parasites).  We will call with the results, check on your symptoms and will decide on possible colonoscopy.

## 2011-12-31 ENCOUNTER — Other Ambulatory Visit: Payer: Medicare Other

## 2011-12-31 DIAGNOSIS — R197 Diarrhea, unspecified: Secondary | ICD-10-CM

## 2012-01-02 LAB — OVA AND PARASITE SCREEN: OP: NONE SEEN

## 2012-01-02 LAB — CLOSTRIDIUM DIFFICILE BY PCR: Toxigenic C. Difficile by PCR: NOT DETECTED

## 2012-01-04 LAB — STOOL CULTURE

## 2012-02-19 ENCOUNTER — Telehealth: Payer: Self-pay | Admitting: Gastroenterology

## 2012-02-20 NOTE — Telephone Encounter (Signed)
Left message on machine to call back  

## 2012-02-24 NOTE — Telephone Encounter (Signed)
Pt still has occasional fecal incontinence with stool and mucous.  No rectal bleeding.  No fever or abd pain.  Per Dr Christella Hartigan note Colon to be scheduled if problem persist.  Pt is on Ticlid for "small vessel in neck to small for stent"  Do you want to see her in the office or schedule colon?

## 2012-02-24 NOTE — Telephone Encounter (Signed)
Ok to schedule for colonoscopy, she'll need to hold hte ticlid for 5 days, will need to get permission from her PCP about holding that ([facility], moderate sedation

## 2012-02-26 NOTE — Telephone Encounter (Signed)
Pt aware pre visit and colon scheduled as well as letter to Dr Pearlean Brownie regarding Ticlid.  Pre visit letter also mailed

## 2012-03-04 ENCOUNTER — Telehealth: Payer: Self-pay

## 2012-03-04 NOTE — Telephone Encounter (Signed)
Received ok to hold ticlid per Dr Pearlean Brownie  letter to be scanned to [facility]. Pt  notified by phone

## 2012-03-15 ENCOUNTER — Ambulatory Visit (AMBULATORY_SURGERY_CENTER): Payer: Medicare Other | Admitting: *Deleted

## 2012-03-15 ENCOUNTER — Encounter: Payer: Self-pay | Admitting: Gastroenterology

## 2012-03-15 VITALS — Ht 69.0 in | Wt 202.0 lb

## 2012-03-15 DIAGNOSIS — R197 Diarrhea, unspecified: Secondary | ICD-10-CM

## 2012-03-15 MED ORDER — MOVIPREP 100 G PO SOLR: ORAL | Status: DC

## 2012-03-17 ENCOUNTER — Encounter: Payer: Self-pay | Admitting: Gastroenterology

## 2012-03-17 ENCOUNTER — Ambulatory Visit (AMBULATORY_SURGERY_CENTER): Payer: Medicare Other | Admitting: Gastroenterology

## 2012-03-17 VITALS — BP 180/91 | HR 61 | Temp 96.6°F | Resp 26 | Ht 69.0 in | Wt 202.0 lb

## 2012-03-17 DIAGNOSIS — D126 Benign neoplasm of colon, unspecified: Secondary | ICD-10-CM

## 2012-03-17 DIAGNOSIS — Z1211 Encounter for screening for malignant neoplasm of colon: Secondary | ICD-10-CM

## 2012-03-17 DIAGNOSIS — R197 Diarrhea, unspecified: Secondary | ICD-10-CM

## 2012-03-17 LAB — GLUCOSE, CAPILLARY
Glucose-Capillary: 115 mg/dL — ABNORMAL HIGH (ref 70–99)
Glucose-Capillary: 101 mg/dL — ABNORMAL HIGH (ref 70–99)

## 2012-03-17 MED ORDER — SODIUM CHLORIDE 0.9 % IV SOLN: 500.0000 mL | INTRAVENOUS | Status: DC

## 2012-03-17 NOTE — Patient Instructions (Addendum)
YOU HAD AN ENDOSCOPIC PROCEDURE TODAY AT THE [facility]: Refer to the procedure report that was given to you for any specific questions about what was found during the examination.  If the procedure report does not answer your questions, please call your gastroenterologist to clarify.  If you requested that your care partner not be given the details of your procedure findings, then the procedure report has been included in a sealed envelope for you to review at your convenience later.  YOU SHOULD EXPECT: Some feelings of bloating in the abdomen. Passage of more gas than usual.  Walking can help get rid of the air that was put into your GI tract during the procedure and reduce the bloating. If you had a lower endoscopy (such as a colonoscopy or flexible sigmoidoscopy) you may notice spotting of blood in your stool or on the toilet paper. If you underwent a bowel prep for your procedure, then you may not have a normal bowel movement for a few days.  DIET: Your first meal following the procedure should be a light meal and then it is ok to progress to your normal diet.  A half-sandwich or bowl of soup is an example of a good first meal.  Heavy or fried foods are harder to digest and may make you feel nauseous or bloated.  Likewise meals heavy in dairy and vegetables can cause extra gas to form and this can also increase the bloating.  Drink plenty of fluids but you should avoid alcoholic beverages for 24 hours.  ACTIVITY: Your care partner should take you home directly after the procedure.  You should plan to take it easy, moving slowly for the rest of the day.  You can resume normal activity the day after the procedure however you should NOT DRIVE or use heavy machinery for 24 hours (because of the sedation medicines used during the test).    SYMPTOMS TO REPORT IMMEDIATELY: A gastroenterologist can be reached at any hour.  During normal business hours, 8:30 AM to 5:00 PM Monday through Friday,  call [phone].  After hours and on weekends, please call the GI answering service at [phone] who will take a message and have the physician on call contact you.   Following lower endoscopy (colonoscopy or flexible sigmoidoscopy):  Excessive amounts of blood in the stool  Significant tenderness or worsening of abdominal pains  Swelling of the abdomen that is new, acute  Fever of 100F or higher  Following upper endoscopy (EGD)  Vomiting of blood or coffee ground material  New chest pain or pain under the shoulder blades  Painful or persistently difficult swallowing  New shortness of breath  Fever of 100F or higher  Black, tarry-looking stools  FOLLOW UP: If any biopsies were taken you will be contacted by phone or by letter within the next 1-3 weeks.  Call your gastroenterologist if you have not heard about the biopsies in 3 weeks.  Our staff will call the home number listed on your records the next business day following your procedure to check on you and address any questions or concerns that you may have at that time regarding the information given to you following your procedure. This is a courtesy call and so if there is no answer at the home number and we have not heard from you through the emergency physician on call, we will assume that you have returned to your regular daily activities without incident.  SIGNATURES/CONFIDENTIALITY: You and/or your care   partner have signed paperwork which will be entered into your electronic medical record.  These signatures attest to the fact that that the information above on your After Visit Summary has been reviewed and is understood.  Full responsibility of the confidentiality of this discharge information lies with you and/or your care-partner.   Please restart Imodium over the counter one pill by mouth twice a day ( one in the morning and one in the evening)

## 2012-03-17 NOTE — Progress Notes (Signed)
Patient did not experience any of the following events: a burn prior to discharge; a fall within the facility; wrong site/side/patient/procedure/implant event; or a hospital transfer or hospital admission upon discharge from the facility. (G8907) Patient did not have preoperative order for IV antibiotic SSI prophylaxis. (G8918)  

## 2012-03-17 NOTE — Progress Notes (Signed)
The pt had abd cramping with the scope advancement, meds were titrated per md's orders.  Once the cecum was reached, the pt relaxed and rested comfortablly the rest of the exam. Maw

## 2012-03-17 NOTE — Op Note (Signed)
[facility] [address] [place], [address]   COLONOSCOPY PROCEDURE REPORT  PATIENT: Margaret, Vazquez  MR#: [id number] BIRTHDATE: Aug 20, 1935 , 76  yrs. old GENDER: Female ENDOSCOPIST: Rachael Fee, MD REFERRED BY: PROCEDURE DATE:  03/17/2012 PROCEDURE:   Colonoscopy with biopsy ASA CLASS:   Class III INDICATIONS:previous adenomatous polyp, new diarrhea and extreme urgency. MEDICATIONS: Fentanyl 75 mcg IV, Versed 7 mg IV, and These medications were titrated to patient response per physician's verbal order  DESCRIPTION OF PROCEDURE:   After the risks benefits and alternatives of the procedure were thoroughly explained, informed consent was obtained.  A digital rectal exam revealed no rectal mass.   The LB [id number]-[id number]  endoscope was introduced through the anus and advanced to the terminal ileum which was intubated for a short distance. No adverse events experienced.   The quality of the prep was good.  The instrument was then slowly withdrawn as the colon was fully examined.   COLON FINDINGS: A normal appearing terminal ileum, cecum, ileocecal valve, and appendiceal orifice were identified.  The ascending, hepatic flexure, transverse, splenic flexure, descending, sigmoid colon and rectum appeared unremarkable.  No polyps or cancers were seen.  Retroflexed views revealed no abnormalities.  The colon was randomly biopsied and sent to pathology.  The time to cecum=4 minutes 02 seconds.  Withdrawal time=9 minutes 16 seconds.  The scope was withdrawn and the procedure completed. COMPLICATIONS: There were no complications.  ENDOSCOPIC IMPRESSION: Normal colon, no polyps or cancers.  Normal terminal ileum. Colon was randomly biopsied to check for microscopic colitis.  RECOMMENDATIONS: Await final biopsy reports, for now please restart imodium at one pill twice daily (scheduled)    eSigned:  Rachael Fee, MD 03/17/2012 9:54 AM

## 2012-03-18 ENCOUNTER — Telehealth: Payer: Self-pay | Admitting: *Deleted

## 2012-03-18 NOTE — Telephone Encounter (Signed)
  Follow up Call-  Call back number 03/17/2012  Post procedure Call Back phone  # [phone]  Permission to leave phone message Yes     Patient questions:  Do you have a fever, pain , or abdominal swelling? no Pain Score  0 *  Have you tolerated food without any problems? yes  Have you been able to return to your normal activities? yes  Do you have any questions about your discharge instructions: Diet   no Medications  no Follow up visit  no  Do you have questions or concerns about your Care? no  Actions: * If pain score is 4 or above: No action needed, pain <4.

## 2012-03-23 ENCOUNTER — Encounter: Payer: Self-pay | Admitting: Gastroenterology

## 2012-04-12 ENCOUNTER — Other Ambulatory Visit: Payer: Self-pay | Admitting: Family Medicine

## 2012-04-12 DIAGNOSIS — Z1231 Encounter for screening mammogram for malignant neoplasm of breast: Secondary | ICD-10-CM

## 2012-04-19 ENCOUNTER — Ambulatory Visit
Admission: RE | Admit: 2012-04-19 | Discharge: 2012-04-19 | Disposition: A | Discharge status: Home / Self Care | Payer: Medicare Other | Source: Ambulatory Visit | Attending: Family Medicine | Admitting: Family Medicine

## 2012-04-19 DIAGNOSIS — Z1231 Encounter for screening mammogram for malignant neoplasm of breast: Secondary | ICD-10-CM

## 2012-04-21 ENCOUNTER — Encounter: Payer: Self-pay | Admitting: *Deleted

## 2012-04-28 ENCOUNTER — Encounter: Payer: Self-pay | Admitting: Family Medicine

## 2012-04-28 ENCOUNTER — Ambulatory Visit (INDEPENDENT_AMBULATORY_CARE_PROVIDER_SITE_OTHER): Payer: Medicare Other | Admitting: Family Medicine

## 2012-04-28 VITALS — BP 158/82 | HR 64 | Temp 98.6°F | Ht 69.0 in | Wt 204.2 lb

## 2012-04-28 DIAGNOSIS — Z23 Encounter for immunization: Secondary | ICD-10-CM

## 2012-04-28 DIAGNOSIS — E119 Type 2 diabetes mellitus without complications: Secondary | ICD-10-CM

## 2012-04-28 DIAGNOSIS — I10 Essential (primary) hypertension: Secondary | ICD-10-CM

## 2012-04-28 DIAGNOSIS — E785 Hyperlipidemia, unspecified: Secondary | ICD-10-CM

## 2012-04-28 LAB — COMPREHENSIVE METABOLIC PANEL
ALT: 22 U/L (ref 0–35)
AST: 18 U/L (ref 0–37)
Albumin: 3.3 g/dL — ABNORMAL LOW (ref 3.5–5.2)
Alkaline Phosphatase: 75 U/L (ref 39–117)
BUN: 11 mg/dL (ref 6–23)
CO2: 30 mEq/L (ref 19–32)
Calcium: 8.6 mg/dL (ref 8.4–10.5)
Chloride: 106 mEq/L (ref 96–112)
Creatinine, Ser: 1 mg/dL (ref 0.4–1.2)
GFR: 61.02 mL/min (ref >60.00)
Glucose, Bld: 97 mg/dL (ref 70–99)
Potassium: 4.1 mEq/L (ref 3.5–5.1)
Sodium: 141 mEq/L (ref 135–145)
Total Bilirubin: 0.4 mg/dL (ref 0.3–1.2)
Total Protein: 6.9 g/dL (ref 6.0–8.3)

## 2012-04-28 LAB — LIPID PANEL
Cholesterol: 195 mg/dL (ref 0–200)
HDL: 43.3 mg/dL (ref >39.00)
LDL Cholesterol: 132 mg/dL — ABNORMAL HIGH (ref 0–99)
Total CHOL/HDL Ratio: 5
Triglycerides: 101 mg/dL (ref 0.0–149.0)
VLDL: 20.2 mg/dL (ref 0.0–40.0)

## 2012-04-28 LAB — HEMOGLOBIN A1C: Hgb A1c MFr Bld: 6.3 % (ref 4.6–6.5)

## 2012-04-28 NOTE — Assessment & Plan Note (Signed)
Pt is off statin  Lipid today Expect up  Rev low sat fat diet in detail

## 2012-04-28 NOTE — Patient Instructions (Addendum)
Avoid red meat/ fried foods/ egg yolks/ fatty breakfast meats/ butter, cheese and high fat dairy/ and shellfish  Labs today  Try to work up to 20-30 minutes of exercise 5 days per week to stay strong and keep your sugar down  Start checking blood pressure at home - (always when relaxed) - and send me a list of your blood pressures in about 2 week  Flu vaccine today  Follow up in 6 months for annual exam with labs prior

## 2012-04-28 NOTE — Progress Notes (Signed)
Subjective:    Patient ID: Margaret Vazquez, female    DOB: 04-04-1936, 76 y.o.   MRN: [id number]  HPI Here for f/u of chronic conditions Is doing well overall  Has not had a flu vaccine this year  bp is up today -on first check - did not take her medicine yet  Her bp is very labile and has been difficult to control  No cp or palpitations or headaches or edema  No side effects to medicines  BP Readings from Last 3 Encounters:  04/28/12 158/82  03/17/12 180/91  12/29/11 150/80     Diabetes Home sugar results - have been good 110-120 in ams and then in daytime around 150 DM diet - not good with that , not really motivated to do that  Exercise not a lot  Symptoms A1C last  Lab Results  Component Value Date   HGBA1C 6.6* 10/20/2011   Due for labs today No problems with medications  Renal protection-on benicar Last eye exam   No longer on statin due to side eff/ swelling Lab Results  Component Value Date   CHOL 211* 10/20/2011   HDL 45.30 10/20/2011   LDLCALC 127* 04/08/2011   LDLDIRECT 136.5 10/20/2011   TRIG 141.0 10/20/2011   CHOLHDL 5 10/20/2011   wants to check today Is avoiding red meat but forgot what else to avoid in diet  Also not much exercise    Patient Active Problem List  Diagnosis  . DIABETES MELLITUS, TYPE II  . VITAMIN B12 DEFICIENCY  . HYPERLIPIDEMIA  . UNSPECIFIED ANEMIA  . HYPERTENSION, ESSENTIAL NOS  . ALLERGIC RHINITIS  . GERD  . OVERACTIVE BLADDER  . BACK PAIN, LUMBAR  . INCONTINENCE, URGE  . SKIN CANCER, HX OF  . PERSONAL HX COLONIC POLYPS  . Urticaria  . Leg pain, bilateral  . Chest pain  . Abnormal urine odor  . Diarrhea  . UTI (lower urinary tract infection)  . Headache in back of head  . Post-menopausal  . Encopresis   Past Medical History  Diagnosis Date  . Hypertension   . Diabetes mellitus     type II  . Hyperlipidemia     myalgias with Lipitor and Zetia  . GERD (gastroesophageal reflux disease)   . Skin cancer     hx of  basal cell/ak's  . Colon polyps 2009  . Allergy history, drug     Aspirin  . Cervical stenosis of spine     With neck pain  . CAD (coronary artery disease) 04/08/11    non obst by cath  . Migraine    Past Surgical History  Procedure Date  . Appendectomy   . Tubal ligation     BTL  . Breast surgery 1992    breast biopsy  . Eye surgery 2005    tear duct surgery  . Nasal sinus surgery 01/2005  . Cardiac catheterization 1993    LHC: normal coronaries.   . Stress cardiolite 11/1999    Normal/ negative  . Cystoscopy w/ decannulation 03/2000    Normal  . Shoulder surgery 10/2002    Impingement  . Abd [place] 07/2003    Negative  . Tear duct surgery 2005  . Colonoscopy 12/2007    Adenomatous colon polyps  . Cardiac catheterization 04/07/2011    non obst CAD (Dr Burt Knack)   History  Substance Use Topics  . Smoking status: Never Smoker   . Smokeless tobacco: Never Used  . Alcohol Use: No  Family History  Problem Relation Age of Onset  . Lung cancer Brother   . Colon cancer Neg Hx   . Skin cancer Daughter   . Diabetes Sister   . Diabetes Brother    Allergies  Allergen Reactions  . Bee Venom Hives, Shortness Of Breath and Swelling  . Nabumetone Anaphylaxis  . Amoxicillin-Pot Clavulanate Hives and Swelling    To lips.  . Aspirin Hives  . Atorvastatin Swelling     joint pain/swelling, inc liver tests  . Clopidogrel Bisulfate Hives  . Codeine Nausea And Vomiting  . Ezetimibe Other (See Comments)     fatigue  . Metformin And Related Other (See Comments)    Diarrhea   . Valsartan Other (See Comments)     fatigue   Current Outpatient Prescriptions on File Prior to Visit  Medication Sig Dispense Refill  . BENICAR 40 MG tablet TAKE ONE TABLET BY MOUTH EVERY DAY  30 each  11  . carvedilol (COREG) 6.25 MG tablet Take 6.25 mg by mouth 2 (two) times daily.      Marland Kitchen EPINEPHrine (EPI-PEN) 0.3 mg/0.3 mL DEVI Inject 0.3 mg into the muscle once.      Marland Kitchen glipiZIDE (GLUCOTROL XL) 5 MG  24 hr tablet Take 5 mg by mouth daily.      . ticlopidine (TICLID) 250 MG tablet Take 250 mg by mouth 2 (two) times daily.            Review of Systems    Review of Systems  Constitutional: Negative for fever, appetite change, fatigue and unexpected weight change.  Eyes: Negative for pain and visual disturbance.  Respiratory: Negative for cough and shortness of breath.   Cardiovascular: Negative for cp or palpitations    Gastrointestinal: Negative for nausea, diarrhea and constipation.  Genitourinary: Negative for urgency and frequency.  Skin: Negative for pallor or rash   Neurological: Negative for weakness, light-headedness, numbness and headaches.  Hematological: Negative for adenopathy. Does not bruise/bleed easily.  Psychiatric/Behavioral: Negative for dysphoric mood. The patient is not nervous/anxious.      Objective:   Physical Exam  Constitutional: She appears well-developed and well-nourished. No distress.       obese and well appearing   HENT:  Head: Normocephalic and atraumatic.  Mouth/Throat: Oropharynx is clear and moist.       Some nasal congestion today  Eyes: Conjunctivae normal and EOM are normal. Pupils are equal, round, and reactive to light.  Neck: Normal range of motion. Neck supple. No JVD present. Carotid bruit is not present. No thyromegaly present.  Cardiovascular: Normal rate, regular rhythm, normal heart sounds and intact distal pulses.  Exam reveals no gallop.   Pulmonary/Chest: Effort normal and breath sounds normal. No respiratory distress. She has no wheezes.  Abdominal: Soft. Bowel sounds are normal. She exhibits no distension, no abdominal bruit and no mass. There is no tenderness.  Musculoskeletal: She exhibits no edema.  Lymphadenopathy:    She has no cervical adenopathy.  Neurological: She is alert. She has normal reflexes. No cranial nerve deficit. She exhibits normal muscle tone. Coordination normal.  Skin: Skin is warm and dry. No rash  noted. No erythema. No pallor.  Psychiatric: She has a normal mood and affect.          Assessment & Plan:

## 2012-04-28 NOTE — Assessment & Plan Note (Signed)
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

## 2012-04-28 NOTE — Assessment & Plan Note (Signed)
bp is up today (hx of labile bp)- but pt did not take her med yet  inst to check bp daily for 2 weeks and send me results Disc imp of exercise

## 2012-05-03 ENCOUNTER — Encounter: Payer: Self-pay | Admitting: *Deleted

## 2012-05-04 ENCOUNTER — Ambulatory Visit (INDEPENDENT_AMBULATORY_CARE_PROVIDER_SITE_OTHER): Payer: Medicare Other | Admitting: Gastroenterology

## 2012-05-04 ENCOUNTER — Encounter: Payer: Self-pay | Admitting: Gastroenterology

## 2012-05-04 VITALS — BP 132/70 | HR 80 | Ht 69.0 in | Wt 206.0 lb

## 2012-05-04 DIAGNOSIS — R197 Diarrhea, unspecified: Secondary | ICD-10-CM

## 2012-05-04 MED ORDER — CHOLESTYRAMINE 4 G PO PACK: 1.0000 | PACK | Freq: Every day | ORAL | Status: DC

## 2012-05-04 NOTE — Progress Notes (Signed)
Review of gastrointestinal problems:  1. Adenomatous colon polyps, removed during colonoscopy June 2009. Next colonoscopy at five-year interval.  Repeat colonoscopy for diarrhea 2013, recall adjusted. 2. several months of diarrhea, possibly related to metformin, symptoms improved after stopping the drug); January 2012; stool test positive for fecal lactoferrin, negative for Clostridium difficile, ova parasites, routine culture.  Colonoscopy 03/2012 was normal to terminal ileum, random colon biopsies showed no micorscopic colitis.  Was put on twice daily imodium.  HPI: This is a very pleasant 76 year old woman whom I last saw 2-3 months ago.  Takes imodium, one pill every other day.  Has seepage into underpants.  No overt bleeding   Past Medical History  Diagnosis Date  . Hypertension   . Diabetes mellitus     type II  . Hyperlipidemia     myalgias with Lipitor and Zetia  . GERD (gastroesophageal reflux disease)   . Skin cancer     hx of basal cell/ak's  . Colon polyps 2009  . Allergy history, drug     Aspirin  . Cervical stenosis of spine     With neck pain  . CAD (coronary artery disease) 04/08/11    non obst by cath  . Migraine     Past Surgical History  Procedure Date  . Appendectomy   . Tubal ligation     BTL  . Breast surgery 1992    breast biopsy  . Eye surgery 2005    tear duct surgery  . Nasal sinus surgery 01/2005  . Cardiac catheterization 1993    LHC: normal coronaries.   . Stress cardiolite 11/1999    Normal/ negative  . Cystoscopy w/ decannulation 03/2000    Normal  . Shoulder surgery 10/2002    Impingement  . Abd [place] 07/2003    Negative  . Tear duct surgery 2005  . Colonoscopy 12/2007    Adenomatous colon polyps  . Cardiac catheterization 04/07/2011    non obst CAD (Dr Burt Knack)    Current Outpatient Prescriptions  Medication Sig Dispense Refill  . BENICAR 40 MG tablet TAKE ONE TABLET BY MOUTH EVERY DAY  30 each  11  . carvedilol (COREG) 6.25 MG  tablet Take 6.25 mg by mouth 2 (two) times daily.      Marland Kitchen EPINEPHrine (EPI-PEN) 0.3 mg/0.3 mL DEVI Inject 0.3 mg into the muscle once.      Marland Kitchen glipiZIDE (GLUCOTROL XL) 5 MG 24 hr tablet Take 5 mg by mouth daily.      . ticlopidine (TICLID) 250 MG tablet Take 250 mg by mouth 2 (two) times daily.         Allergies as of 05/04/2012 - Review Complete 05/04/2012  Allergen Reaction Noted  . Bee venom Hives, Shortness Of Breath, and Swelling 12/20/2010  . Nabumetone Anaphylaxis 11/24/2006  . Amoxicillin-pot clavulanate Hives and Swelling 05/29/2008  . Aspirin Hives   . Atorvastatin Swelling 11/24/2006  . Clopidogrel bisulfate Hives 01/01/2010  . Codeine Nausea And Vomiting 11/24/2006  . Ezetimibe Other (See Comments) 11/24/2006  . Metformin and related Other (See Comments) 08/07/2011  . Valsartan Other (See Comments) 12/04/2009    Family History  Problem Relation Age of Onset  . Lung cancer Brother   . Colon cancer Neg Hx   . Skin cancer Daughter   . Diabetes Sister   . Diabetes Brother     History   Social History  . Marital Status: Married    Spouse Name: N/A    Number of Children:  3  . Years of Education: N/A   Occupational History  . Retired    Social History Main Topics  . Smoking status: Never Smoker   . Smokeless tobacco: Never Used  . Alcohol Use: No  . Drug Use: No  . Sexually Active: Not on file   Other Topics Concern  . Not on file   Social History Narrative   3 childrenDoes not drink caffeinated beveragesCares for SIL with dementia      Physical Exam: BP 132/70  Pulse 80  Ht '5\' 9"'$  (1.753 m)  Wt 206 lb (93.441 kg)  BMI 30.42 kg/m2 Constitutional: generally well-appearing Psychiatric: alert and oriented x3 Abdomen: soft, nontender, nondistended, no obvious ascites, no peritoneal signs, normal bowel sounds     Assessment and plan: 76 y.o. female with continued loose stools, fecal soilage of her underpants  2 over medicines list diarrhea as #1  side effects. That is Ticlid and Glucotrol. 2 of her other medicines list diarrhea is #3 or 4 side effects. I suspect the medicines or at least contributing to her loose stools, fecal soilage. She did continue taking Imodium however at want her to take it every single day instead of just every other day. She is also going to start taking cholestyramine 1 packet once daily. She'll call my office in 3-4 weeks to report on her symptoms

## 2012-05-04 NOTE — Patient Instructions (Addendum)
#  1 side effect of glucotrol and ticlid is diarrhea. Coreg and benicar also list diarrhea as a potential side effect. Trial of cholestyramine, take one pack once every morning. Continue taking imodium one pill every day. Call in 3-4 weeks to report on your symptoms, sooner if needed.

## 2012-05-12 ENCOUNTER — Other Ambulatory Visit: Payer: Self-pay | Admitting: Family Medicine

## 2012-06-08 ENCOUNTER — Telehealth: Payer: Self-pay

## 2012-06-08 NOTE — Telephone Encounter (Signed)
Pt brought paperwork from [employer]are complete; Ticlopidine 250 mg is not covered in 2014. Alternative drugs on 2014 drug list that would be a $ 6.00 co pay is anagrelide hydrochloride, cilostazol and clopidogrel. Brilinta and effient are $45.00 copay. Please advise. [employer].  Pt also left BP readings  with no dates: 166/73 p 66; 177/81 p 61; 133/70 p 59; 137/68 p 57; 147/72 p 62; 144/74 p 56;  149/69 p 56; 162/76 p 58; 154/74 p 58; 150/71 p 62; 154/76 p 59;128/73 p 58 and 129/64 p 63.  This morning BP reading was 177/78 p 61. Pt has no h/a, dizziness, or chest pain.  [employer]/ Medicare complete paperwork and list of above BP on Dr Royden Purl shelf.Please advise.

## 2012-06-09 NOTE — Telephone Encounter (Signed)
Pt scheduled appt for 06/14/12

## 2012-06-09 NOTE — Telephone Encounter (Signed)
Some of those blood pressures are too high- may need to re evaluate tx - and also disc alt meds to the ticlid F/u when able -- if she cannot come until after the holidays that is ok

## 2012-06-14 ENCOUNTER — Encounter: Payer: Self-pay | Admitting: Family Medicine

## 2012-06-14 ENCOUNTER — Ambulatory Visit (INDEPENDENT_AMBULATORY_CARE_PROVIDER_SITE_OTHER): Payer: Medicare Other | Admitting: Family Medicine

## 2012-06-14 VITALS — BP 130/68 | HR 60 | Temp 98.3°F | Ht 69.0 in | Wt 204.8 lb

## 2012-06-14 DIAGNOSIS — I10 Essential (primary) hypertension: Secondary | ICD-10-CM

## 2012-06-14 NOTE — Patient Instructions (Addendum)
I reviewed options to ticlid and since none of them are appropriate and the cost is high - I understand you are going to stop it  Let me know if any problems Blood pressure is good today I know it goes up and down at home - always try to check it when you are very relaxed  No change in your blood pressure medicines

## 2012-06-14 NOTE — Assessment & Plan Note (Addendum)
bp is stable today  No cp or palpitations or headaches or edema  No side effects to medicines  BP Readings from Last 3 Encounters:  06/14/12 130/68  05/04/12 132/70  04/28/12 158/82    This is labile at home - adv to continue checking only when very relaxed and will continue to follow  Pt will have to stop ticlid due to cost- was taking this as an alt to aspirin for prevention due to risk factors Asa all and clopogel allergic also  No other options at this time that I feel are safe Will continue to eval

## 2012-06-14 NOTE — Progress Notes (Signed)
Subjective:    Patient ID: Margaret Vazquez, female    DOB: [birth year], 76 y.o.   MRN: [id number]  HPI Pt is here with HTN that is very labile Had called with range of readings Is good today bp is stable today  No cp or palpitations or headaches or edema  No side effects to medicines  BP Readings from Last 3 Encounters:  06/14/12 134/68  05/04/12 132/70  04/28/12 158/82    Re check 130/68  Also her insurance will no longer cover ticlopidine Used to pay 6$ , now cost 45$ She is all to asa Also all to clopidogrel No other feasible options - reviewed each of them - all for claudication of platelet discorders  She will have to stop it    No other options looked feasible   Patient Active Problem List  Diagnosis  . DIABETES MELLITUS, TYPE II  . VITAMIN B12 DEFICIENCY  . HYPERLIPIDEMIA  . UNSPECIFIED ANEMIA  . HYPERTENSION, ESSENTIAL NOS  . ALLERGIC RHINITIS  . GERD  . OVERACTIVE BLADDER  . BACK PAIN, LUMBAR  . INCONTINENCE, URGE  . SKIN CANCER, HX OF  . PERSONAL HX COLONIC POLYPS  . Abnormal urine odor  . Diarrhea  . Post-menopausal  . Encopresis   Past Medical History  Diagnosis Date  . Hypertension   . Diabetes mellitus     type II  . Hyperlipidemia     myalgias with Lipitor and Zetia  . GERD (gastroesophageal reflux disease)   . Skin cancer     hx of basal cell/ak's  . Colon polyps 2009  . Allergy history, drug     Aspirin  . Cervical stenosis of spine     With neck pain  . CAD (coronary artery disease) 04/08/11    non obst by cath  . Migraine    Past Surgical History  Procedure Date  . Appendectomy   . Tubal ligation     BTL  . Breast surgery 1992    breast biopsy  . Eye surgery 2005    tear duct surgery  . Nasal sinus surgery 01/2005  . Cardiac catheterization 1993    LHC: normal coronaries.   . Stress cardiolite 11/1999    Normal/ negative  . Cystoscopy w/ decannulation 03/2000    Normal  . Shoulder surgery 10/2002    Impingement  . Abd  [place] 07/2003    Negative  . Tear duct surgery 2005  . Colonoscopy 12/2007    Adenomatous colon polyps  . Cardiac catheterization 04/07/2011    non obst CAD (Dr Burt Knack)   History  Substance Use Topics  . Smoking status: Never Smoker   . Smokeless tobacco: Never Used  . Alcohol Use: No   Family History  Problem Relation Age of Onset  . Lung cancer Brother   . Colon cancer Neg Hx   . Skin cancer Daughter   . Diabetes Sister   . Diabetes Brother    Allergies  Allergen Reactions  . Bee Venom Hives, Shortness Of Breath and Swelling  . Nabumetone Anaphylaxis  . Amoxicillin-Pot Clavulanate Hives and Swelling    To lips.  . Aspirin Hives  . Atorvastatin Swelling     joint pain/swelling, inc liver tests  . Clopidogrel Bisulfate Hives  . Codeine Nausea And Vomiting  . Ezetimibe Other (See Comments)     fatigue  . Metformin And Related Other (See Comments)    Diarrhea   . Valsartan Other (See Comments)  fatigue   Current Outpatient Prescriptions on File Prior to Visit  Medication Sig Dispense Refill  . BENICAR 40 MG tablet TAKE ONE TABLET BY MOUTH EVERY DAY  30 each  11  . carvedilol (COREG) 6.25 MG tablet TAKE ONE TABLET BY MOUTH TWICE DAILY  60 tablet  4  . cholestyramine (QUESTRAN) 4 G packet Take 1 packet by mouth daily before breakfast.  30 each  12  . EPINEPHrine (EPI-PEN) 0.3 mg/0.3 mL DEVI Inject 0.3 mg into the muscle once.      Marland Kitchen glipiZIDE (GLUCOTROL XL) 5 MG 24 hr tablet Take 5 mg by mouth daily.      . ticlopidine (TICLID) 250 MG tablet Take 250 mg by mouth 2 (two) times daily.       . carvedilol (COREG) 6.25 MG tablet Take 6.25 mg by mouth 2 (two) times daily.            Review of Systems Review of Systems  Constitutional: Negative for fever, appetite change, fatigue and unexpected weight change.  Eyes: Negative for pain and visual disturbance.  Respiratory: Negative for cough and shortness of breath.   Cardiovascular: Negative for cp or palpitations     Gastrointestinal: Negative for nausea, diarrhea and constipation.  Genitourinary: Negative for urgency and frequency.  Skin: Negative for pallor or rash   Neurological: Negative for weakness, light-headedness, numbness and headaches.  Hematological: Negative for adenopathy. Does not bruise/bleed easily.  Psychiatric/Behavioral: Negative for dysphoric mood. The patient is not nervous/anxious.         Objective:   Physical Exam  Constitutional: She appears well-developed and well-nourished. No distress.  HENT:  Head: Normocephalic and atraumatic.  Mouth/Throat: Oropharynx is clear and moist.  Eyes: Conjunctivae normal and EOM are normal. Pupils are equal, round, and reactive to light. No scleral icterus.  Neck: Normal range of motion. Neck supple. No JVD present. Carotid bruit is not present. No thyromegaly present.  Cardiovascular: Normal rate, regular rhythm, normal heart sounds and intact distal pulses.  Exam reveals no gallop.   Pulmonary/Chest: Effort normal and breath sounds normal. No respiratory distress. She has no wheezes.  Abdominal: Soft. Bowel sounds are normal. She exhibits no distension, no abdominal bruit and no mass. There is no tenderness.  Musculoskeletal: She exhibits no edema.  Lymphadenopathy:    She has no cervical adenopathy.  Neurological: She is alert. She has normal reflexes. No cranial nerve deficit. She exhibits normal muscle tone. Coordination normal.  Skin: Skin is warm and dry. No rash noted. No erythema. No pallor.  Psychiatric: She has a normal mood and affect.          Assessment & Plan:

## 2012-08-02 ENCOUNTER — Other Ambulatory Visit: Payer: Self-pay | Admitting: Family Medicine

## 2012-08-05 ENCOUNTER — Emergency Department (HOSPITAL_COMMUNITY): Payer: Medicare Other

## 2012-08-05 ENCOUNTER — Encounter (HOSPITAL_COMMUNITY): Payer: Self-pay | Admitting: Emergency Medicine

## 2012-08-05 ENCOUNTER — Emergency Department (HOSPITAL_COMMUNITY)
Admission: EM | Admit: 2012-08-05 | Discharge: 2012-08-05 | Disposition: A | Discharge status: Home / Self Care | Payer: Medicare Other | Attending: Emergency Medicine | Admitting: Emergency Medicine

## 2012-08-05 DIAGNOSIS — R509 Fever, unspecified: Secondary | ICD-10-CM

## 2012-08-05 DIAGNOSIS — Z8719 Personal history of other diseases of the digestive system: Secondary | ICD-10-CM | POA: Insufficient documentation

## 2012-08-05 DIAGNOSIS — Z860100 Personal history of colon polyps, unspecified: Secondary | ICD-10-CM | POA: Insufficient documentation

## 2012-08-05 DIAGNOSIS — E785 Hyperlipidemia, unspecified: Secondary | ICD-10-CM | POA: Insufficient documentation

## 2012-08-05 DIAGNOSIS — Z79899 Other long term (current) drug therapy: Secondary | ICD-10-CM | POA: Insufficient documentation

## 2012-08-05 DIAGNOSIS — E119 Type 2 diabetes mellitus without complications: Secondary | ICD-10-CM | POA: Insufficient documentation

## 2012-08-05 DIAGNOSIS — I10 Essential (primary) hypertension: Secondary | ICD-10-CM | POA: Insufficient documentation

## 2012-08-05 DIAGNOSIS — Z888 Allergy status to other drugs, medicaments and biological substances status: Secondary | ICD-10-CM | POA: Insufficient documentation

## 2012-08-05 DIAGNOSIS — Z8679 Personal history of other diseases of the circulatory system: Secondary | ICD-10-CM | POA: Insufficient documentation

## 2012-08-05 DIAGNOSIS — Z8739 Personal history of other diseases of the musculoskeletal system and connective tissue: Secondary | ICD-10-CM | POA: Insufficient documentation

## 2012-08-05 DIAGNOSIS — Z85828 Personal history of other malignant neoplasm of skin: Secondary | ICD-10-CM | POA: Insufficient documentation

## 2012-08-05 DIAGNOSIS — I2510 Atherosclerotic heart disease of native coronary artery without angina pectoris: Secondary | ICD-10-CM | POA: Insufficient documentation

## 2012-08-05 LAB — CBC WITH DIFFERENTIAL/PLATELET
Basophils Absolute: 0 10*3/uL (ref 0.0–0.1)
Basophils Relative: 1 % (ref 0–1)
Eosinophils Absolute: 0.2 10*3/uL (ref 0.0–0.7)
Eosinophils Relative: 2 % (ref 0–5)
HCT: 42.7 % (ref 36.0–46.0)
Hemoglobin: 14.3 g/dL (ref 12.0–15.0)
Lymphocytes Relative: 29 % (ref 12–46)
Lymphs Abs: 2.5 10*3/uL (ref 0.7–4.0)
MCH: 31.3 pg (ref 26.0–34.0)
MCHC: 33.5 g/dL (ref 30.0–36.0)
MCV: 93.4 fL (ref 78.0–100.0)
Monocytes Absolute: 0.7 10*3/uL (ref 0.1–1.0)
Monocytes Relative: 8 % (ref 3–12)
Neutro Abs: 5.2 10*3/uL (ref 1.7–7.7)
Neutrophils Relative %: 61 % (ref 43–77)
Platelets: 213 10*3/uL (ref 150–400)
RBC: 4.57 MIL/uL (ref 3.87–5.11)
RDW: 12.8 % (ref 11.5–15.5)
WBC: 8.6 10*3/uL (ref 4.0–10.5)

## 2012-08-05 LAB — URINALYSIS, ROUTINE W REFLEX MICROSCOPIC
Bilirubin Urine: NEGATIVE
Glucose, UA: NEGATIVE mg/dL
Ketones, ur: NEGATIVE mg/dL
Leukocytes, UA: NEGATIVE
Nitrite: NEGATIVE
Protein, ur: NEGATIVE mg/dL
Specific Gravity, Urine: 1.021 (ref 1.005–1.030)
Urobilinogen, UA: 0.2 mg/dL (ref 0.0–1.0)
pH: 5 (ref 5.0–8.0)

## 2012-08-05 LAB — COMPREHENSIVE METABOLIC PANEL
ALT: 13 U/L (ref 0–35)
AST: 17 U/L (ref 0–37)
Albumin: 3.9 g/dL (ref 3.5–5.2)
Alkaline Phosphatase: 93 U/L (ref 39–117)
BUN: 19 mg/dL (ref 6–23)
CO2: 24 mEq/L (ref 19–32)
Calcium: 9.7 mg/dL (ref 8.4–10.5)
Chloride: 103 mEq/L (ref 96–112)
Creatinine, Ser: 0.87 mg/dL (ref 0.50–1.10)
GFR calc Af Amer: 74 mL/min — ABNORMAL LOW (ref >90)
GFR calc non Af Amer: 64 mL/min — ABNORMAL LOW (ref >90)
Glucose, Bld: 124 mg/dL — ABNORMAL HIGH (ref 70–99)
Potassium: 4.1 mEq/L (ref 3.5–5.1)
Sodium: 140 mEq/L (ref 135–145)
Total Bilirubin: 0.4 mg/dL (ref 0.3–1.2)
Total Protein: 7.5 g/dL (ref 6.0–8.3)

## 2012-08-05 LAB — CK TOTAL AND CKMB (NOT AT ARMC)
CK, MB: 3 ng/mL (ref 0.3–4.0)
Relative Index: 2.9 — ABNORMAL HIGH (ref 0.0–2.5)
Total CK: 103 U/L (ref 7–177)

## 2012-08-05 LAB — URINE MICROSCOPIC-ADD ON

## 2012-08-05 LAB — TROPONIN I: Troponin I: <0.3 ng/mL (ref <0.30)

## 2012-08-05 LAB — INFLUENZA PANEL BY PCR (TYPE A & B)
H1N1 flu by pcr: NOT DETECTED
Influenza A By PCR: NEGATIVE
Influenza B By PCR: NEGATIVE

## 2012-08-05 LAB — GLUCOSE, CAPILLARY: Glucose-Capillary: 131 mg/dL — ABNORMAL HIGH (ref 70–99)

## 2012-08-05 LAB — LACTIC ACID, PLASMA: Lactic Acid, Venous: 1.9 mmol/L (ref 0.5–2.2)

## 2012-08-05 MED ORDER — ACETAMINOPHEN 325 MG PO TABS
650.0000 mg | ORAL_TABLET | Freq: Once | ORAL | Status: AC
  Administered 2012-08-05: 650 mg via ORAL
  Filled 2012-08-05: qty 2

## 2012-08-05 MED ORDER — OSELTAMIVIR PHOSPHATE 75 MG PO CAPS: 75.0000 mg | ORAL_CAPSULE | Freq: Two times a day (BID) | ORAL | Status: DC

## 2012-08-05 MED ORDER — ONDANSETRON HCL 4 MG/2ML IJ SOLN
4.0000 mg | Freq: Once | INTRAMUSCULAR | Status: AC
  Administered 2012-08-05: 4 mg via INTRAVENOUS
  Filled 2012-08-05: qty 2

## 2012-08-05 NOTE — ED Notes (Signed)
Xray in room with pt

## 2012-08-05 NOTE — Discharge Instructions (Signed)
You were found to have a fever today in the ER.  No specific cause for your fever was found at this time.  It is unlikely that you have influenza as you received a flu shot this year.  You were tested for the flu.  You were given a prescription for medicine for the flu.  You will be contacted if your flu testing is positive.  Please follow up with your doctor for recheck in 2-3 days.  Return to the ER for worsening condition or new concerning symptoms.  Fever, Adult A fever is a higher than normal body temperature. In an adult, an oral temperature around 98.6 F (37 C) is considered normal. A temperature of 100.4 F (38 C) or higher is generally considered a fever. Mild or moderate fevers generally have no long-term effects and often do not require treatment. Extreme fever (greater than or equal to 106 F or 41.1 C) can cause seizures. The sweating that may occur with repeated or prolonged fever may cause dehydration. Elderly people can develop confusion during a fever. A measured temperature can vary with:  Age.  Time of day.  Method of measurement (mouth, underarm, rectal, or ear). The fever is confirmed by taking a temperature with a thermometer. Temperatures can be taken different ways. Some methods are accurate and some are not.  An oral temperature is used most commonly. Electronic thermometers are fast and accurate.  An ear temperature will only be accurate if the thermometer is positioned as recommended by the manufacturer.  A rectal temperature is accurate and done for those adults who have a condition where an oral temperature cannot be taken.  An underarm (axillary) temperature is not accurate and not recommended. Fever is a symptom, not a disease.  CAUSES   Infections commonly cause fever.  Some noninfectious causes for fever include:  Some arthritis conditions.  Some thyroid or adrenal gland conditions.  Some immune system conditions.  Some types of cancer.  A  medicine reaction.  High doses of certain street drugs such as methamphetamine.  Dehydration.  Exposure to high outside or room temperatures.  Occasionally, the source of a fever cannot be determined. This is sometimes called a "fever of unknown origin" (FUO).  Some situations may lead to a temporary rise in body temperature that may go away on its own. Examples are:  Childbirth.  Surgery.  Intense exercise. HOME CARE INSTRUCTIONS   Take appropriate medicines for fever. Follow dosing instructions carefully. If you use acetaminophen to reduce the fever, be careful to avoid taking other medicines that also contain acetaminophen. Do not take aspirin for a fever if you are younger than age 64. There is an association with Reye's syndrome. Reye's syndrome is a rare but potentially deadly disease.  If an infection is present and antibiotics have been prescribed, take them as directed. Finish them even if you start to feel better.  Rest as needed.  Maintain an adequate fluid intake. To prevent dehydration during an illness with prolonged or recurrent fever, you may need to drink extra fluid.Drink enough fluids to keep your urine clear or pale yellow.  Sponging or bathing with room temperature water may help reduce body temperature. Do not use ice water or alcohol sponge baths.  Dress comfortably, but do not over-bundle. SEEK MEDICAL CARE IF:   You are unable to keep fluids down.  You develop vomiting or diarrhea.  You are not feeling at least partly better after 3 days.  You develop new symptoms  or problems. SEEK IMMEDIATE MEDICAL CARE IF:   You have shortness of breath or trouble breathing.  You develop excessive weakness.  You are dizzy or you faint.  You are extremely thirsty or you are making little or no urine.  You develop new pain that was not there before (such as in the head, neck, chest, back, or abdomen).  You have persistant vomiting and diarrhea for more than  1 to 2 days.  You develop a stiff neck or your eyes become sensitive to light.  You develop a skin rash.  You have a fever or persistent symptoms for more than 2 to 3 days.  You have a fever and your symptoms suddenly get worse. MAKE SURE YOU:   Understand these instructions.  Will watch your condition.  Will get help right away if you are not doing well or get worse. Document Released: 12/17/2000 Document Revised: 09/15/2011 Document Reviewed: 04/24/2011 [facility] Patient Information 2013 [place].

## 2012-08-05 NOTE — ED Notes (Signed)
Pt brought to ED by EMS with chest tightness,shaking and CBG 70.No active chest  Pain.

## 2012-08-05 NOTE — ED Provider Notes (Signed)
History     CSN: [id number]  Arrival date & time 08/05/12  0440   First MD Initiated Contact with Patient 08/05/12 [phone]      Chief Complaint  Patient presents with  . Chest Pain    (Consider location/radiation/quality/duration/timing/severity/associated sxs/prior treatment) HPI 77 year old female presents to the emergency department via EMS from home with complaint of shaking. Patient reports she woke abruptly around 3 AM shaking all over. Patient reports she's had similar episodes in the past with low blood sugar. She checked her sugar, and thinks it was around 70. She ate a piece of cake. When symptoms did not improve, she called 911. EMS reports patient was shaking upon their arrival. CBG by their check was 70. Upon arrival to the emergency department the patient complaining of left-sided chest pressure and chest wall pain, persistent shaking, and nausea and dry heaves. She denies any headache, no short of breath no cough. No urinary symptoms, no abdominal pain. No sick contacts. Patient noted to have fever. She reports she received a Pneumovax and influenza shot this year. She been feeling well before going to bed last night.  Past Medical History  Diagnosis Date  . Hypertension   . Diabetes mellitus     type II  . Hyperlipidemia     myalgias with Lipitor and Zetia  . GERD (gastroesophageal reflux disease)   . Skin cancer     hx of basal cell/ak's  . Colon polyps 2009  . Allergy history, drug     Aspirin  . Cervical stenosis of spine     With neck pain  . CAD (coronary artery disease) 04/08/11    non obst by cath  . Migraine     Past Surgical History  Procedure Date  . Appendectomy   . Tubal ligation     BTL  . Breast surgery 1992    breast biopsy  . Eye surgery 2005    tear duct surgery  . Nasal sinus surgery 01/2005  . Cardiac catheterization 1993    LHC: normal coronaries.   . Stress cardiolite 11/1999    Normal/ negative  . Cystoscopy w/ decannulation  03/2000    Normal  . Shoulder surgery 10/2002    Impingement  . Abd [place] 07/2003    Negative  . Tear duct surgery 2005  . Colonoscopy 12/2007    Adenomatous colon polyps  . Cardiac catheterization 04/07/2011    non obst CAD (Dr Excell Seltzer)    Family History  Problem Relation Age of Onset  . Lung cancer Brother   . Colon cancer Neg Hx   . Skin cancer Daughter   . Diabetes Sister   . Diabetes Brother     History  Substance Use Topics  . Smoking status: Never Smoker   . Smokeless tobacco: Never Used  . Alcohol Use: No    OB History    Grav Para Term Preterm Abortions TAB SAB Ect Mult Living                  Review of Systems  See History of Present Illness; otherwise all other systems are reviewed and negative Allergies  Bee venom; Nabumetone; Amoxicillin-pot clavulanate; Aspirin; Atorvastatin; Clopidogrel bisulfate; Codeine; Ezetimibe; Metformin and related; and Valsartan  Home Medications   Current Outpatient Rx  Name  Route  Sig  Dispense  Refill  . BENICAR 40 MG PO TABS      TAKE ONE TABLET BY MOUTH EVERY DAY   30 each  11   . CARVEDILOL 6.25 MG PO TABS      TAKE ONE TABLET BY MOUTH TWICE DAILY   60 tablet   4   . GLIPIZIDE ER 5 MG PO TB24   Oral   Take 5 mg by mouth daily.         Marland Kitchen CARVEDILOL 6.25 MG PO TABS   Oral   Take 6.25 mg by mouth 2 (two) times daily.         Marland Kitchen EPINEPHRINE 0.3 MG/0.3ML IJ DEVI   Intramuscular   Inject 0.3 mg into the muscle once.           BP 161/106  Pulse 83  Temp 101.9 F (38.8 C) (Rectal)  Resp 18  SpO2 95%  Physical Exam  Nursing note and vitals reviewed. Constitutional: She is oriented to person, place, and time. She appears distressed (Uncomfortable appearing, vomiting ).  HENT:  Head: Normocephalic and atraumatic.  Nose: Nose normal.  Mouth/Throat: Oropharynx is clear and moist.  Neck: Normal range of motion. Neck supple. No JVD present. No tracheal deviation present. No thyromegaly present.   Cardiovascular: Normal rate, regular rhythm, normal heart sounds and intact distal pulses.  Exam reveals no gallop and no friction rub.   No murmur heard. Pulmonary/Chest: Effort normal and breath sounds normal. No stridor. No respiratory distress. She has no wheezes. She has no rales. She exhibits no tenderness.  Abdominal: Soft. Bowel sounds are normal. She exhibits no distension and no mass. There is no tenderness. There is no rebound and no guarding.  Musculoskeletal: Normal range of motion. She exhibits no edema and no tenderness.  Lymphadenopathy:    She has no cervical adenopathy.  Neurological: She is alert and oriented to person, place, and time.       Patient with rigors noted  Skin: Skin is warm and dry. No rash noted. No erythema. No pallor.  Psychiatric: She has a normal mood and affect. Her behavior is normal. Judgment and thought content normal.    ED Course  Procedures (including critical care time)  Labs Reviewed  COMPREHENSIVE METABOLIC PANEL - Abnormal; Notable for the following:    Glucose, Bld 124 (*)     GFR calc non Af Amer 64 (*)     GFR calc Af Amer 74 (*)     All other components within normal limits  CK TOTAL AND CKMB - Abnormal; Notable for the following:    Relative Index 2.9 (*)     All other components within normal limits  GLUCOSE, CAPILLARY - Abnormal; Notable for the following:    Glucose-Capillary 131 (*)     All other components within normal limits  URINALYSIS, ROUTINE W REFLEX MICROSCOPIC - Abnormal; Notable for the following:    APPearance HAZY (*)     Hgb urine dipstick SMALL (*)     All other components within normal limits  URINE MICROSCOPIC-ADD ON - Abnormal; Notable for the following:    Squamous Epithelial / LPF FEW (*)     Bacteria, UA FEW (*)     All other components within normal limits  CBC WITH DIFFERENTIAL  LACTIC ACID, PLASMA  TROPONIN I  URINE CULTURE  INFLUENZA PANEL BY PCR   No results found.   Date: 08/05/2012   Rate: 86  Rhythm: normal sinus rhythm  QRS Axis: normal  Intervals: normal  ST/T Wave abnormalities: normal  Conduction Disutrbances:none  Narrative Interpretation:   Old EKG Reviewed: unchanged   1. Fever with  chills       MDM  77 year old female woke with shaking, most likely Reiter's given her fever of 101.2. We'll check baseline labs. EKG does not show acute abnormality. Will treat for fever and monitor closely  6:36 AM No specific cause for fever found.  Pt has received flu shot this year.  She is feeling better after tylenol.  Will send pcr flu swab, and send patient home with tamiflu to be filled if it is positive.  Pt is comfortable with d/c home.  I have sent a message to her pcm as well.        Olivia Mackie, MD 08/05/12 [phone]

## 2012-08-06 LAB — URINE CULTURE
Colony Count: NO GROWTH
Culture: NO GROWTH

## 2012-08-09 ENCOUNTER — Encounter: Payer: Self-pay | Admitting: Family Medicine

## 2012-08-09 ENCOUNTER — Ambulatory Visit (INDEPENDENT_AMBULATORY_CARE_PROVIDER_SITE_OTHER): Payer: Medicare Other | Admitting: Family Medicine

## 2012-08-09 VITALS — BP 136/78 | HR 56 | Temp 98.0°F | Ht 69.0 in | Wt 206.5 lb

## 2012-08-09 DIAGNOSIS — B349 Viral infection, unspecified: Secondary | ICD-10-CM | POA: Insufficient documentation

## 2012-08-09 DIAGNOSIS — B9789 Other viral agents as the cause of diseases classified elsewhere: Secondary | ICD-10-CM

## 2012-08-09 NOTE — Patient Instructions (Addendum)
I think you had a virus the day that you had the fever (possibly the "noro" stomach virus) You did not have influenza - the swab was negative I am glad you are feeling much better now  If fever or other symptoms return- please let me know

## 2012-08-09 NOTE — Assessment & Plan Note (Signed)
Now resolved  Rev ER records in detail -rev with pt  Pt had a less than 12 hour fever with chills and nausea and neg flu swab (never filled the tamiflu)  Nl exam today- asymptomatic Suspect she may have had a mild version of the noro virus Adv to call if any of her symptoms return (esp fever)

## 2012-08-09 NOTE — Progress Notes (Signed)
Subjective:    Patient ID: Margaret Vazquez, female    DOB: [birth year], 77 y.o.   MRN: [id number]  HPI Here for f/u from ER 1/30  Had gone in with chills/shaking fever (was 101.2)  Her sugar level was 70- and she ate cake - when no imp called EMS  By the time she got to ER had L chest discomfort and nausea and dry heaves   Labs ok  ua ok  EKG ok  Cardiac enzymes ok  Given tamiflu on chance of flu- and then flu swab came back normal   Did not end up getting diarrhea   Fever is gone  Is feeling better now - back to normal  Had a bit of a headache for a day and then normal   Patient Active Problem List  Diagnosis  . DIABETES MELLITUS, TYPE II  . VITAMIN B12 DEFICIENCY  . HYPERLIPIDEMIA  . UNSPECIFIED ANEMIA  . HYPERTENSION, ESSENTIAL NOS  . ALLERGIC RHINITIS  . GERD  . OVERACTIVE BLADDER  . BACK PAIN, LUMBAR  . INCONTINENCE, URGE  . SKIN CANCER, HX OF  . PERSONAL HX COLONIC POLYPS  . Abnormal urine odor  . Diarrhea  . Post-menopausal  . Encopresis   Past Medical History  Diagnosis Date  . Hypertension   . Diabetes mellitus     type II  . Hyperlipidemia     myalgias with Lipitor and Zetia  . GERD (gastroesophageal reflux disease)   . Skin cancer     hx of basal cell/ak's  . Colon polyps 2009  . Allergy history, drug     Aspirin  . Cervical stenosis of spine     With neck pain  . CAD (coronary artery disease) 04/08/11    non obst by cath  . Migraine    Past Surgical History  Procedure Date  . Appendectomy   . Tubal ligation     BTL  . Breast surgery 1992    breast biopsy  . Eye surgery 2005    tear duct surgery  . Nasal sinus surgery 01/2005  . Cardiac catheterization 1993    LHC: normal coronaries.   . Stress cardiolite 11/1999    Normal/ negative  . Cystoscopy w/ decannulation 03/2000    Normal  . Shoulder surgery 10/2002    Impingement  . Abd [place] 07/2003    Negative  . Tear duct surgery 2005  . Colonoscopy 12/2007    Adenomatous colon  polyps  . Cardiac catheterization 04/07/2011    non obst CAD (Dr Excell Seltzer)   History  Substance Use Topics  . Smoking status: Never Smoker   . Smokeless tobacco: Never Used  . Alcohol Use: No   Family History  Problem Relation Age of Onset  . Lung cancer Brother   . Colon cancer Neg Hx   . Skin cancer Daughter   . Diabetes Sister   . Diabetes Brother    Allergies  Allergen Reactions  . Bee Venom Hives, Shortness Of Breath and Swelling  . Nabumetone Anaphylaxis  . Amoxicillin-Pot Clavulanate Hives and Swelling    To lips.  . Aspirin Hives  . Atorvastatin Swelling     joint pain/swelling, inc liver tests  . Clopidogrel Bisulfate Hives  . Codeine Nausea And Vomiting  . Ezetimibe Other (See Comments)     fatigue  . Metformin And Related Other (See Comments)    Diarrhea   . Valsartan Other (See Comments)     fatigue   Current  Outpatient Prescriptions on File Prior to Visit  Medication Sig Dispense Refill  . BENICAR 40 MG tablet TAKE ONE TABLET BY MOUTH EVERY DAY  30 each  11  . carvedilol (COREG) 6.25 MG tablet TAKE ONE TABLET BY MOUTH TWICE DAILY  60 tablet  4  . EPINEPHrine (EPI-PEN) 0.3 mg/0.3 mL DEVI Inject 0.3 mg into the muscle once.      Marland Kitchen glipiZIDE (GLUCOTROL XL) 5 MG 24 hr tablet Take 5 mg by mouth daily.      . carvedilol (COREG) 6.25 MG tablet Take 6.25 mg by mouth 2 (two) times daily.            Review of Systems Review of Systems  Constitutional: Negative for fever, appetite change, fatigue and unexpected weight change. (fever and rigors are resolved now) Eyes: Negative for pain and visual disturbance.  Respiratory: Negative for cough and shortness of breath.   Cardiovascular: Negative for cp or palpitations    Gastrointestinal: Negative for nausea, diarrhea and constipation.  Genitourinary: Negative for urgency and frequency.  Skin: Negative for pallor or rash   Neurological: Negative for weakness, light-headedness, numbness and headaches.   Hematological: Negative for adenopathy. Does not bruise/bleed easily.  Psychiatric/Behavioral: Negative for dysphoric mood. The patient is not nervous/anxious.         Objective:   Physical Exam  Constitutional: She appears well-developed and well-nourished. No distress.       overwt and well appearing   HENT:  Head: Normocephalic and atraumatic.  Right Ear: External ear normal.  Left Ear: External ear normal.  Nose: Nose normal.  Mouth/Throat: Oropharynx is clear and moist.  Eyes: Conjunctivae normal and EOM are normal. Pupils are equal, round, and reactive to light. Right eye exhibits no discharge. Left eye exhibits no discharge. No scleral icterus.  Neck: Normal range of motion. Neck supple. No thyromegaly present.  Cardiovascular: Normal rate, regular rhythm and intact distal pulses.   Pulmonary/Chest: Effort normal and breath sounds normal. No respiratory distress. She has no wheezes. She has no rales. She exhibits no tenderness.  Abdominal: Soft. Bowel sounds are normal. She exhibits no distension and no mass. There is no tenderness.  Musculoskeletal: She exhibits no edema and no tenderness.  Lymphadenopathy:    She has no cervical adenopathy.  Neurological: She is alert. She has normal reflexes. She displays no tremor. No cranial nerve deficit. She exhibits normal muscle tone. Coordination normal.  Skin: Skin is warm and dry. No rash noted. No erythema. No pallor.  Psychiatric: She has a normal mood and affect.          Assessment & Plan:

## 2012-09-07 ENCOUNTER — Ambulatory Visit: Payer: Medicare Other | Admitting: Family Medicine

## 2012-09-27 LAB — HM DIABETES EYE EXAM: HM Diabetic Eye Exam: NORMAL

## 2012-09-29 ENCOUNTER — Encounter: Payer: Self-pay | Admitting: Family Medicine

## 2012-09-29 ENCOUNTER — Ambulatory Visit (INDEPENDENT_AMBULATORY_CARE_PROVIDER_SITE_OTHER): Payer: Medicare Other | Admitting: Family Medicine

## 2012-09-29 VITALS — BP 104/64 | HR 58 | Temp 97.8°F | Ht 69.0 in | Wt 207.0 lb

## 2012-09-29 DIAGNOSIS — T148 Other injury of unspecified body region: Secondary | ICD-10-CM

## 2012-09-29 DIAGNOSIS — W57XXXA Bitten or stung by nonvenomous insect and other nonvenomous arthropods, initial encounter: Secondary | ICD-10-CM | POA: Insufficient documentation

## 2012-09-29 NOTE — Progress Notes (Signed)
Subjective:    Patient ID: Margaret Vazquez, female    DOB: October 03, 1935, 77 y.o.   MRN: [id number]  HPI Here with a tick bite on R shoulder  Walked [place] the woods on Saturday  By Sunday she noted itching - there was a big tick with a white spot on it  Used fine tweezers ? If got all of it out Used antiseptic  Now has a water blister and some redness  ? If rash   She is on augmentin for a sinus infection   No fever Feels ok  No joint swelling   Patient Active Problem List  Diagnosis  . DIABETES MELLITUS, TYPE II  . VITAMIN B12 DEFICIENCY  . HYPERLIPIDEMIA  . UNSPECIFIED ANEMIA  . HYPERTENSION, ESSENTIAL NOS  . ALLERGIC RHINITIS  . GERD  . OVERACTIVE BLADDER  . BACK PAIN, LUMBAR  . INCONTINENCE, URGE  . SKIN CANCER, HX OF  . PERSONAL HX COLONIC POLYPS  . Abnormal urine odor  . Diarrhea  . Post-menopausal  . Encopresis  . Viral syndrome   Past Medical History  Diagnosis Date  . Hypertension   . Diabetes mellitus     type II  . Hyperlipidemia     myalgias with Lipitor and Zetia  . GERD (gastroesophageal reflux disease)   . Skin cancer     hx of basal cell/ak's  . Colon polyps 2009  . Allergy history, drug     Aspirin  . Cervical stenosis of spine     With neck pain  . CAD (coronary artery disease) 04/08/11    non obst by cath  . Migraine    Past Surgical History  Procedure Laterality Date  . Appendectomy    . Tubal ligation      BTL  . Breast surgery  1992    breast biopsy  . Eye surgery  2005    tear duct surgery  . Nasal sinus surgery  01/2005  . Cardiac catheterization  1993    LHC: normal coronaries.   . Stress cardiolite  11/1999    Normal/ negative  . Cystoscopy w/ decannulation  03/2000    Normal  . Shoulder surgery  10/2002    Impingement  . Abd [place]  07/2003    Negative  . Tear duct surgery  2005  . Colonoscopy  12/2007    Adenomatous colon polyps  . Cardiac catheterization  04/07/2011    non obst CAD (Dr Excell Seltzer)   History   Substance Use Topics  . Smoking status: Never Smoker   . Smokeless tobacco: Never Used  . Alcohol Use: No   Family History  Problem Relation Age of Onset  . Lung cancer Brother   . Colon cancer Neg Hx   . Skin cancer Daughter   . Diabetes Sister   . Diabetes Brother    Allergies  Allergen Reactions  . Bee Venom Hives, Shortness Of Breath and Swelling  . Nabumetone Anaphylaxis  . Amoxicillin-Pot Clavulanate Hives and Swelling    To lips.  . Aspirin Hives  . Atorvastatin Swelling     joint pain/swelling, inc liver tests  . Clopidogrel Bisulfate Hives  . Codeine Nausea And Vomiting  . Ezetimibe Other (See Comments)     fatigue  . Metformin And Related Other (See Comments)    Diarrhea   . Valsartan Other (See Comments)     fatigue   Current Outpatient Prescriptions on File Prior to Visit  Medication Sig Dispense Refill  .  BENICAR 40 MG tablet TAKE ONE TABLET BY MOUTH EVERY DAY  30 each  11  . carvedilol (COREG) 6.25 MG tablet TAKE ONE TABLET BY MOUTH TWICE DAILY  60 tablet  4  . EPINEPHrine (EPI-PEN) 0.3 mg/0.3 mL DEVI Inject 0.3 mg into the muscle once.      Marland Kitchen glipiZIDE (GLUCOTROL XL) 5 MG 24 hr tablet Take 5 mg by mouth daily.       No current facility-administered medications on file prior to visit.      Review of Systems Review of Systems  Constitutional: Negative for fever, appetite change, fatigue and unexpected weight change.  Eyes: Negative for pain and visual disturbance.  Respiratory: Negative for cough and shortness of breath.   Cardiovascular: Negative for cp or palpitations    Gastrointestinal: Negative for nausea, diarrhea and constipation.  Genitourinary: Negative for urgency and frequency.  Skin: Negative for pallor or rash, pos for redness and swelling at site of tick bite  Neurological: Negative for weakness, light-headedness, numbness and headaches.  Hematological: Negative for adenopathy. Does not bruise/bleed easily.  Psychiatric/Behavioral:  Negative for dysphoric mood. The patient is not nervous/anxious.         Objective:   Physical Exam  Constitutional: She appears well-developed and well-nourished. No distress.  obese and well appearing   HENT:  Head: Normocephalic and atraumatic.  Eyes: Conjunctivae and EOM are normal. Pupils are equal, round, and reactive to light.  Neck: Normal range of motion. Neck supple.  Cardiovascular: Normal rate and regular rhythm.   Pulmonary/Chest: Effort normal and breath sounds normal.  Musculoskeletal: She exhibits no edema.  No joint changes   Lymphadenopathy:    She has no cervical adenopathy.  Neurological: She is alert.  Skin: Skin is warm and dry. No rash noted. There is erythema. No pallor.  Right mid back -  Tick bite with scab (unroofed with sterile needle and no tick parts left) with 1-2 cm of erythema surrounding -no target shape or central clearing No rash   Psychiatric: She has a normal mood and affect.          Assessment & Plan:

## 2012-09-29 NOTE — Patient Instructions (Addendum)
Keep tick bite very clean and use antibiotic ointment (like triple antibiotic) If redness or swelling increase or if you develop a target appearance to the bite (red and white circles) - let me know  There was no tick remaining Let me know if any fever / illness (sore throat / nausea) or joint swelling - call and let me know because these can be symptoms of tick fever Take benadryl for the itch  Update if not starting to improve in a week or if worsening

## 2012-09-29 NOTE — Assessment & Plan Note (Signed)
R side of back- unroofed scab and no tick parts present (cleaned and dressed) See directions in AVS for care  Disc s/s of tick fever to watch for incl fever/ rash / target shaped lesion or constitutional symptoms  Will update if no improvement

## 2012-10-18 ENCOUNTER — Other Ambulatory Visit: Payer: Self-pay | Admitting: Family Medicine

## 2012-10-19 ENCOUNTER — Telehealth: Payer: Self-pay | Admitting: Family Medicine

## 2012-10-19 DIAGNOSIS — E119 Type 2 diabetes mellitus without complications: Secondary | ICD-10-CM

## 2012-10-19 DIAGNOSIS — I10 Essential (primary) hypertension: Secondary | ICD-10-CM

## 2012-10-19 DIAGNOSIS — D649 Anemia, unspecified: Secondary | ICD-10-CM

## 2012-10-19 DIAGNOSIS — K219 Gastro-esophageal reflux disease without esophagitis: Secondary | ICD-10-CM

## 2012-10-19 DIAGNOSIS — E538 Deficiency of other specified B group vitamins: Secondary | ICD-10-CM

## 2012-10-19 DIAGNOSIS — E785 Hyperlipidemia, unspecified: Secondary | ICD-10-CM

## 2012-10-19 NOTE — Telephone Encounter (Signed)
Message copied by Judy Pimple on Tue Oct 19, 2012 10:28 PM ------      Message from: Alvina Chou      Created: Thu Oct 14, 2012 12:22 PM      Regarding: Lab orders for Wednesday, 4.16.14       Patient is scheduled for CPX labs, please order future labs, Thanks , Terri       ------

## 2012-10-20 ENCOUNTER — Telehealth: Payer: Self-pay

## 2012-10-20 ENCOUNTER — Ambulatory Visit (INDEPENDENT_AMBULATORY_CARE_PROVIDER_SITE_OTHER): Payer: Medicare Other | Admitting: Family Medicine

## 2012-10-20 ENCOUNTER — Encounter: Payer: Self-pay | Admitting: Family Medicine

## 2012-10-20 ENCOUNTER — Other Ambulatory Visit (INDEPENDENT_AMBULATORY_CARE_PROVIDER_SITE_OTHER): Payer: Medicare Other

## 2012-10-20 VITALS — BP 142/62 | HR 66 | Temp 98.2°F | Ht 69.0 in | Wt 206.0 lb

## 2012-10-20 DIAGNOSIS — I10 Essential (primary) hypertension: Secondary | ICD-10-CM

## 2012-10-20 DIAGNOSIS — R300 Dysuria: Secondary | ICD-10-CM

## 2012-10-20 DIAGNOSIS — E119 Type 2 diabetes mellitus without complications: Secondary | ICD-10-CM

## 2012-10-20 DIAGNOSIS — K219 Gastro-esophageal reflux disease without esophagitis: Secondary | ICD-10-CM

## 2012-10-20 DIAGNOSIS — E785 Hyperlipidemia, unspecified: Secondary | ICD-10-CM

## 2012-10-20 DIAGNOSIS — D649 Anemia, unspecified: Secondary | ICD-10-CM

## 2012-10-20 DIAGNOSIS — N390 Urinary tract infection, site not specified: Secondary | ICD-10-CM

## 2012-10-20 DIAGNOSIS — E538 Deficiency of other specified B group vitamins: Secondary | ICD-10-CM

## 2012-10-20 LAB — COMPREHENSIVE METABOLIC PANEL
ALT: 19 U/L (ref 0–35)
AST: 17 U/L (ref 0–37)
Albumin: 3.9 g/dL (ref 3.5–5.2)
Alkaline Phosphatase: 81 U/L (ref 39–117)
BUN: 12 mg/dL (ref 6–23)
CO2: 25 mEq/L (ref 19–32)
Calcium: 9 mg/dL (ref 8.4–10.5)
Chloride: 106 mEq/L (ref 96–112)
Creatinine, Ser: 0.9 mg/dL (ref 0.4–1.2)
GFR: 64.87 mL/min (ref >60.00)
Glucose, Bld: 118 mg/dL — ABNORMAL HIGH (ref 70–99)
Potassium: 4.1 mEq/L (ref 3.5–5.1)
Sodium: 140 mEq/L (ref 135–145)
Total Bilirubin: 0.7 mg/dL (ref 0.3–1.2)
Total Protein: 7.1 g/dL (ref 6.0–8.3)

## 2012-10-20 LAB — POCT URINALYSIS DIPSTICK
Bilirubin, UA: NEGATIVE
Glucose, UA: NEGATIVE
Ketones, UA: NEGATIVE
Nitrite, UA: POSITIVE
Protein, UA: NEGATIVE
Spec Grav, UA: 1.02
Urobilinogen, UA: 0.2
pH, UA: 6

## 2012-10-20 LAB — CBC WITH DIFFERENTIAL/PLATELET
Basophils Absolute: 0.1 10*3/uL (ref 0.0–0.1)
Basophils Relative: 1.1 % (ref 0.0–3.0)
Eosinophils Absolute: 0.3 10*3/uL (ref 0.0–0.7)
Eosinophils Relative: 4.1 % (ref 0.0–5.0)
HCT: 42.1 % (ref 36.0–46.0)
Hemoglobin: 14.1 g/dL (ref 12.0–15.0)
Lymphocytes Relative: 29.6 % (ref 12.0–46.0)
Lymphs Abs: 2.1 10*3/uL (ref 0.7–4.0)
MCHC: 33.5 g/dL (ref 30.0–36.0)
MCV: 93.1 fl (ref 78.0–100.0)
Monocytes Absolute: 0.4 10*3/uL (ref 0.1–1.0)
Monocytes Relative: 6.4 % (ref 3.0–12.0)
Neutro Abs: 4.2 10*3/uL (ref 1.4–7.7)
Neutrophils Relative %: 58.8 % (ref 43.0–77.0)
Platelets: 221 10*3/uL (ref 150.0–400.0)
RBC: 4.53 Mil/uL (ref 3.87–5.11)
RDW: 13.1 % (ref 11.5–14.6)
WBC: 7.1 10*3/uL (ref 4.5–10.5)

## 2012-10-20 LAB — LIPID PANEL
Cholesterol: 185 mg/dL (ref 0–200)
HDL: 33.3 mg/dL — ABNORMAL LOW (ref >39.00)
LDL Cholesterol: 122 mg/dL — ABNORMAL HIGH (ref 0–99)
Total CHOL/HDL Ratio: 6
Triglycerides: 147 mg/dL (ref 0.0–149.0)
VLDL: 29.4 mg/dL (ref 0.0–40.0)

## 2012-10-20 LAB — HEMOGLOBIN A1C: Hgb A1c MFr Bld: 6.4 % (ref 4.6–6.5)

## 2012-10-20 LAB — VITAMIN B12: Vitamin B-12: 328 pg/mL (ref 211–911)

## 2012-10-20 LAB — TSH: TSH: 2.05 u[IU]/mL (ref 0.35–5.50)

## 2012-10-20 MED ORDER — CIPROFLOXACIN HCL 250 MG PO TABS: 250.0000 mg | ORAL_TABLET | Freq: Two times a day (BID) | ORAL | Status: DC

## 2012-10-20 NOTE — Patient Instructions (Addendum)
Drink lots of water for uti  Take cipro as directed  If worse or not improving let me know  We will call about urine culture when it returns

## 2012-10-20 NOTE — Progress Notes (Signed)
Subjective:    Patient ID: Margaret Vazquez, female    DOB: [birth year], 77 y.o.   MRN: [id number]  HPI Is here with uti symptoms  Started on Friday - dysuria , and then frequency of urination - a bit of blood in her urine  Was better the next day  Now hurts to urinate, no more blood  No fever/back pain or vomiting   Patient Active Problem List  Diagnosis  . DIABETES MELLITUS, TYPE II  . VITAMIN B12 DEFICIENCY  . HYPERLIPIDEMIA  . UNSPECIFIED ANEMIA  . HYPERTENSION, ESSENTIAL NOS  . ALLERGIC RHINITIS  . GERD  . OVERACTIVE BLADDER  . BACK PAIN, LUMBAR  . INCONTINENCE, URGE  . SKIN CANCER, HX OF  . PERSONAL HX COLONIC POLYPS  . Abnormal urine odor  . Diarrhea  . Post-menopausal  . Encopresis  . Viral syndrome  . Tick bite   Past Medical History  Diagnosis Date  . Hypertension   . Diabetes mellitus     type II  . Hyperlipidemia     myalgias with Lipitor and Zetia  . GERD (gastroesophageal reflux disease)   . Skin cancer     hx of basal cell/ak's  . Colon polyps 2009  . Allergy history, drug     Aspirin  . Cervical stenosis of spine     With neck pain  . CAD (coronary artery disease) 04/08/11    non obst by cath  . Migraine    Past Surgical History  Procedure Laterality Date  . Appendectomy    . Tubal ligation      BTL  . Breast surgery  1992    breast biopsy  . Eye surgery  2005    tear duct surgery  . Nasal sinus surgery  01/2005  . Cardiac catheterization  1993    LHC: normal coronaries.   . Stress cardiolite  11/1999    Normal/ negative  . Cystoscopy w/ decannulation  03/2000    Normal  . Shoulder surgery  10/2002    Impingement  . Abd [place]  07/2003    Negative  . Tear duct surgery  2005  . Colonoscopy  12/2007    Adenomatous colon polyps  . Cardiac catheterization  04/07/2011    non obst CAD (Dr Excell Seltzer)   History  Substance Use Topics  . Smoking status: Never Smoker   . Smokeless tobacco: Never Used  . Alcohol Use: No   Family History   Problem Relation Age of Onset  . Lung cancer Brother   . Colon cancer Neg Hx   . Skin cancer Daughter   . Diabetes Sister   . Diabetes Brother    Allergies  Allergen Reactions  . Bee Venom Hives, Shortness Of Breath and Swelling  . Nabumetone Anaphylaxis  . Amoxicillin-Pot Clavulanate Hives and Swelling    To lips.  . Aspirin Hives  . Atorvastatin Swelling     joint pain/swelling, inc liver tests  . Clopidogrel Bisulfate Hives  . Codeine Nausea And Vomiting  . Ezetimibe Other (See Comments)     fatigue  . Metformin And Related Other (See Comments)    Diarrhea   . Valsartan Other (See Comments)     fatigue   Current Outpatient Prescriptions on File Prior to Visit  Medication Sig Dispense Refill  . BENICAR 40 MG tablet TAKE ONE TABLET BY MOUTH EVERY DAY  30 each  11  . carvedilol (COREG) 6.25 MG tablet TAKE ONE TABLET BY MOUTH TWICE DAILY  60 tablet  0  . EPINEPHrine (EPI-PEN) 0.3 mg/0.3 mL DEVI Inject 0.3 mg into the muscle once.      Marland Kitchen glipiZIDE (GLUCOTROL XL) 5 MG 24 hr tablet Take 5 mg by mouth daily.       No current facility-administered medications on file prior to visit.      Review of Systems Review of Systems  Constitutional: Negative for fever, appetite change, fatigue and unexpected weight change.  Eyes: Negative for pain and visual disturbance.  Respiratory: Negative for cough and shortness of breath.   Cardiovascular: Negative for cp or palpitations    Gastrointestinal: Negative for nausea, diarrhea and constipation.  Genitourinary: pos  for urgency and frequency. neg for flank pain or vomiting  Skin: Negative for pallor or rash   Neurological: Negative for weakness, light-headedness, numbness and headaches.  Hematological: Negative for adenopathy. Does not bruise/bleed easily.  Psychiatric/Behavioral: Negative for dysphoric mood. The patient is not nervous/anxious.         Objective:   Physical Exam  Constitutional: She appears well-developed and  well-nourished. No distress.  HENT:  Head: Normocephalic and atraumatic.  Mouth/Throat: Oropharynx is clear and moist.  Eyes: Conjunctivae and EOM are normal. Pupils are equal, round, and reactive to light.  Neck: Normal range of motion. Neck supple.  Cardiovascular: Normal rate and regular rhythm.   Pulmonary/Chest: Effort normal and breath sounds normal.  Abdominal: Soft. Bowel sounds are normal. She exhibits no distension and no mass. There is tenderness in the suprapubic area. There is no rebound and no guarding.  Musculoskeletal:  No cva tenderness   Lymphadenopathy:    She has no cervical adenopathy.  Skin: Skin is warm and dry.  Psychiatric: She has a normal mood and affect.  Pt in a hurry to visit friend in the hospital - she is stressed and pre occupied but pleasant as usual          Assessment & Plan:

## 2012-10-20 NOTE — Assessment & Plan Note (Signed)
Since Friday-originally with blood -now no more  Other symptoms continue  ua pos cx ordered inst to push water tx with cipro and update

## 2012-10-20 NOTE — Telephone Encounter (Signed)
On 10/15/12 pt had blood in urine; has not seen blood since 04/11 but today pain when urinates.pt to see Dr Milinda Antis today at 10:15 am.

## 2012-10-21 LAB — POCT UA - MICROSCOPIC ONLY
Casts, Ur, LPF, POC: 0
Yeast, UA: 0

## 2012-10-22 LAB — URINE CULTURE: Colony Count: >=100000

## 2012-10-27 ENCOUNTER — Encounter: Payer: Self-pay | Admitting: Family Medicine

## 2012-10-27 ENCOUNTER — Ambulatory Visit (INDEPENDENT_AMBULATORY_CARE_PROVIDER_SITE_OTHER): Payer: Medicare Other | Admitting: Family Medicine

## 2012-10-27 ENCOUNTER — Ambulatory Visit: Payer: Medicare Other | Admitting: Family Medicine

## 2012-10-27 VITALS — BP 124/64 | HR 59 | Temp 98.0°F | Ht 68.5 in | Wt 206.5 lb

## 2012-10-27 DIAGNOSIS — I10 Essential (primary) hypertension: Secondary | ICD-10-CM

## 2012-10-27 DIAGNOSIS — E785 Hyperlipidemia, unspecified: Secondary | ICD-10-CM

## 2012-10-27 DIAGNOSIS — E538 Deficiency of other specified B group vitamins: Secondary | ICD-10-CM

## 2012-10-27 DIAGNOSIS — E119 Type 2 diabetes mellitus without complications: Secondary | ICD-10-CM

## 2012-10-27 DIAGNOSIS — Z0000 Encounter for general adult medical examination without abnormal findings: Secondary | ICD-10-CM

## 2012-10-27 MED ORDER — OLMESARTAN MEDOXOMIL 40 MG PO TABS: 40.0000 mg | ORAL_TABLET | Freq: Every day | ORAL | Status: DC

## 2012-10-27 MED ORDER — GLIPIZIDE ER 5 MG PO TB24: 5.0000 mg | ORAL_TABLET | Freq: Every day | ORAL | Status: DC

## 2012-10-27 MED ORDER — CARVEDILOL 6.25 MG PO TABS: 6.2500 mg | ORAL_TABLET | Freq: Two times a day (BID) | ORAL | Status: DC

## 2012-10-27 NOTE — Patient Instructions (Signed)
Please consider working on an advanced directive (living will and power of attorney) Try to get more exercise - aim for 30 minutes 5 days per week of extra exercise  This will help increase your "good" cholesterol

## 2012-10-27 NOTE — Progress Notes (Signed)
Subjective:    Patient ID: Margaret Vazquez, female    DOB: Mar 25, 1936, 77 y.o.   MRN: [id number]  HPI I have personally reviewed the Medicare Annual Wellness questionnaire and have noted 1. The patient's medical and social history 2. Their use of alcohol, tobacco or illicit drugs 3. Their current medications and supplements 4. The patient's functional ability including ADL's, fall risks, home safety risks and hearing or visual             impairment. 5. Diet and physical activities 6. Evidence for depression or mood disorders  The patients weight, height, BMI have been recorded in the chart and visual acuity is per eye clinic.  I have made referrals, counseling and provided education to the patient based review of the above and I have provided the pt with a written personalized care plan for preventive services.  See scanned forms.  Routine anticipatory guidance given to patient.  See health maintenance. Flu vaccine 10/13 Shingles status - is afraid of them  PNA vacc 5/12 Tetanus 4/13 Colonoscopy 9/13 normal Breast cancer screening mammogram 10/13 Self exam-no lumps or changes  Gyn/pap 4/13 - with gyn Dr Luella Cook - will see him soon for exam and breast exam Advance directive- does not have one -has not thought about Cognitive function addressed- see scanned forms- and if abnormal then additional documentation follows.  No issues with memory at all   Nl dexa 5/13 fx none  Falls- none (tries to be careful), she thinks her balance is good   Mood - stays cheerful despite stress and motivated   Wt is stable bmi over 30  Hard to get weight off  Is working a lot in the yard  Watching out for ticks - lots in her yard   Dm Lab Results  Component Value Date   HGBA1C 6.4 10/20/2012   good control opthy- last month- was ok / no retinopathy Watching a small catract   Lipids Lab Results  Component Value Date   CHOL 185 10/20/2012   CHOL 195 04/28/2012   CHOL 211* 10/20/2011    Lab Results  Component Value Date   HDL 33.30* 10/20/2012   HDL 43.30 04/28/2012   HDL 45.30 10/20/2011   Lab Results  Component Value Date   LDLCALC 122* 10/20/2012   LDLCALC 132* 04/28/2012   LDLCALC 127* 04/08/2011   Lab Results  Component Value Date   TRIG 147.0 10/20/2012   TRIG 101.0 04/28/2012   TRIG 141.0 10/20/2011   Lab Results  Component Value Date   CHOLHDL 6 10/20/2012   CHOLHDL 5 04/28/2012   CHOLHDL 5 10/20/2011   Lab Results  Component Value Date   LDLDIRECT 136.5 10/20/2011   LDLDIRECT 134.7 08/08/2008   LDLDIRECT 143.0 04/27/2007     PMH and SH reviewed  Meds, vitals, and allergies reviewed.   Patient Active Problem List  Diagnosis  . DIABETES MELLITUS, TYPE II  . VITAMIN B12 DEFICIENCY  . HYPERLIPIDEMIA  . UNSPECIFIED ANEMIA  . HYPERTENSION, ESSENTIAL NOS  . ALLERGIC RHINITIS  . GERD  . OVERACTIVE BLADDER  . BACK PAIN, LUMBAR  . INCONTINENCE, URGE  . SKIN CANCER, HX OF  . PERSONAL HX COLONIC POLYPS  . Abnormal urine odor  . Diarrhea  . Post-menopausal  . Encopresis  . Tick bite  . UTI (urinary tract infection)  . Encounter for Medicare annual wellness exam   Past Medical History  Diagnosis Date  . Hypertension   . Diabetes mellitus  type II  . Hyperlipidemia     myalgias with Lipitor and Zetia  . GERD (gastroesophageal reflux disease)   . Skin cancer     hx of basal cell/ak's  . Colon polyps 2009  . Allergy history, drug     Aspirin  . Cervical stenosis of spine     With neck pain  . CAD (coronary artery disease) 04/08/11    non obst by cath  . Migraine    Past Surgical History  Procedure Laterality Date  . Appendectomy    . Tubal ligation      BTL  . Breast surgery  1992    breast biopsy  . Eye surgery  2005    tear duct surgery  . Nasal sinus surgery  01/2005  . Cardiac catheterization  1993    LHC: normal coronaries.   . Stress cardiolite  11/1999    Normal/ negative  . Cystoscopy w/ decannulation  03/2000     Normal  . Shoulder surgery  10/2002    Impingement  . Abd [place]  07/2003    Negative  . Tear duct surgery  2005  . Colonoscopy  12/2007    Adenomatous colon polyps  . Cardiac catheterization  04/07/2011    non obst CAD (Dr Excell Seltzer)   History  Substance Use Topics  . Smoking status: Never Smoker   . Smokeless tobacco: Never Used  . Alcohol Use: No   Family History  Problem Relation Age of Onset  . Lung cancer Brother   . Colon cancer Neg Hx   . Skin cancer Daughter   . Diabetes Sister   . Diabetes Brother    Allergies  Allergen Reactions  . Bee Venom Hives, Shortness Of Breath and Swelling  . Nabumetone Anaphylaxis  . Amoxicillin-Pot Clavulanate Hives and Swelling    To lips.  . Aspirin Hives  . Atorvastatin Swelling     joint pain/swelling, inc liver tests  . Clopidogrel Bisulfate Hives  . Codeine Nausea And Vomiting  . Ezetimibe Other (See Comments)     fatigue  . Metformin And Related Other (See Comments)    Diarrhea   . Valsartan Other (See Comments)     fatigue   Current Outpatient Prescriptions on File Prior to Visit  Medication Sig Dispense Refill  . BENICAR 40 MG tablet TAKE ONE TABLET BY MOUTH EVERY DAY  30 each  11  . carvedilol (COREG) 6.25 MG tablet TAKE ONE TABLET BY MOUTH TWICE DAILY  60 tablet  0  . EPINEPHrine (EPI-PEN) 0.3 mg/0.3 mL DEVI Inject 0.3 mg into the muscle once.      Marland Kitchen glipiZIDE (GLUCOTROL XL) 5 MG 24 hr tablet Take 5 mg by mouth daily.       No current facility-administered medications on file prior to visit.    Review of Systems Review of Systems  Constitutional: Negative for fever, appetite change, fatigue and unexpected weight change.  Eyes: Negative for pain and visual disturbance.  Respiratory: Negative for cough and shortness of breath.   Cardiovascular: Negative for cp or palpitations    Gastrointestinal: Negative for nausea, diarrhea and constipation.  Genitourinary: Negative for urgency and frequency.  Skin: Negative for  pallor or rash   Neurological: Negative for weakness, light-headedness, numbness and headaches.  Hematological: Negative for adenopathy. Does not bruise/bleed easily.  Psychiatric/Behavioral: Negative for dysphoric mood. The patient is not nervous/anxious.         Objective:   Physical Exam  Constitutional: She appears  well-nourished. No distress.  obese and well appearing   HENT:  Head: Normocephalic and atraumatic.  Right Ear: External ear normal.  Left Ear: External ear normal.  Nose: Nose normal.  Mouth/Throat: Oropharynx is clear and moist.  Eyes: Conjunctivae and EOM are normal. Pupils are equal, round, and reactive to light. Right eye exhibits no discharge. Left eye exhibits no discharge. No scleral icterus.  Neck: Normal range of motion. Neck supple. No JVD present. Carotid bruit is not present. No thyromegaly present.  Cardiovascular: Normal rate, regular rhythm, normal heart sounds and intact distal pulses.  Exam reveals no gallop.   Pulmonary/Chest: Effort normal and breath sounds normal. No respiratory distress. She has no wheezes.  Abdominal: Soft. Bowel sounds are normal. She exhibits no distension, no abdominal bruit and no mass. There is no tenderness.  Musculoskeletal: Normal range of motion. She exhibits no edema and no tenderness.  Lymphadenopathy:    She has no cervical adenopathy.  Neurological: She is alert. She has normal reflexes. No cranial nerve deficit. She exhibits normal muscle tone. Coordination normal.  Skin: Skin is warm and dry. No rash noted. No erythema. No pallor.  Solar lentigos diffusely  Also SKs  Psychiatric: She has a normal mood and affect.          Assessment & Plan:

## 2012-10-28 NOTE — Assessment & Plan Note (Signed)
bp in fair control at this time  No changes needed  Disc lifstyle change with low sodium diet and exercise  Labs reviewed  

## 2012-10-28 NOTE — Assessment & Plan Note (Signed)
Reviewed health habits including diet and exercise and skin cancer prevention Also reviewed health mt list, fam hx and immunizations  See HPI Rev labs in detail Pt has routine gyn f/u upcoming

## 2012-10-28 NOTE — Assessment & Plan Note (Signed)
Disc goals for lipids and reasons to control them Rev labs with pt Rev low sat fat diet in detail Enc exercise to help inc her HDL

## 2012-10-28 NOTE — Assessment & Plan Note (Signed)
Lab Results  Component Value Date   VITAMINB12 328 10/20/2012   Due to shortage will start repl orally- 1000 mcg daily and follow F/u 6 mo

## 2012-10-28 NOTE — Assessment & Plan Note (Signed)
Stable A1c opthy utd  No change in medicine Again stressed imp of wt loss and exercise/ low glycemic diet

## 2012-11-02 ENCOUNTER — Ambulatory Visit: Payer: Medicare Other | Admitting: Cardiology

## 2012-11-02 ENCOUNTER — Encounter: Payer: Medicare Other | Admitting: Cardiology

## 2013-01-13 ENCOUNTER — Telehealth: Payer: Self-pay

## 2013-01-13 ENCOUNTER — Other Ambulatory Visit: Payer: Self-pay

## 2013-01-13 NOTE — Telephone Encounter (Signed)
Please call in slim line glucose meter for use with DM 250.0 to check glucose as directed #1 no ref

## 2013-01-13 NOTE — Telephone Encounter (Signed)
Pt said her glucose meter broke and needs new meter one touch slim line glucose meter to [employer] at [place].  Pt has refills available on test strips; just needs new meter.Please advise.

## 2013-01-14 NOTE — Telephone Encounter (Signed)
Done and in IN box, thanks  

## 2013-01-14 NOTE — Telephone Encounter (Signed)
Spoke with pharmacy and since pt has medicare there is a form we have to complete to order a new meter, pharmacy faxed over form and I placed it in your inbox

## 2013-01-14 NOTE — Telephone Encounter (Signed)
Form faxed

## 2013-02-04 DIAGNOSIS — I639 Cerebral infarction, unspecified: Secondary | ICD-10-CM

## 2013-02-04 HISTORY — DX: Cerebral infarction, unspecified: I63.9

## 2013-02-14 ENCOUNTER — Inpatient Hospital Stay (HOSPITAL_COMMUNITY)
Admission: EM | Admit: 2013-02-14 | Discharge: 2013-02-16 | DRG: 041 | Disposition: A | Discharge status: Home / Self Care | Payer: Medicare Other | Attending: Internal Medicine | Admitting: Internal Medicine

## 2013-02-14 ENCOUNTER — Emergency Department (HOSPITAL_COMMUNITY): Payer: Medicare Other

## 2013-02-14 ENCOUNTER — Inpatient Hospital Stay (HOSPITAL_COMMUNITY): Payer: Medicare Other

## 2013-02-14 ENCOUNTER — Encounter (HOSPITAL_COMMUNITY): Payer: Self-pay | Admitting: *Deleted

## 2013-02-14 ENCOUNTER — Observation Stay (HOSPITAL_COMMUNITY): Payer: Medicare Other

## 2013-02-14 ENCOUNTER — Telehealth: Payer: Self-pay

## 2013-02-14 DIAGNOSIS — E785 Hyperlipidemia, unspecified: Secondary | ICD-10-CM

## 2013-02-14 DIAGNOSIS — R200 Anesthesia of skin: Secondary | ICD-10-CM | POA: Diagnosis present

## 2013-02-14 DIAGNOSIS — K219 Gastro-esophageal reflux disease without esophagitis: Secondary | ICD-10-CM | POA: Diagnosis present

## 2013-02-14 DIAGNOSIS — I6330 Cerebral infarction due to thrombosis of unspecified cerebral artery: Secondary | ICD-10-CM | POA: Diagnosis present

## 2013-02-14 DIAGNOSIS — E538 Deficiency of other specified B group vitamins: Secondary | ICD-10-CM

## 2013-02-14 DIAGNOSIS — G8190 Hemiplegia, unspecified affecting unspecified side: Secondary | ICD-10-CM | POA: Diagnosis present

## 2013-02-14 DIAGNOSIS — Z8673 Personal history of transient ischemic attack (TIA), and cerebral infarction without residual deficits: Secondary | ICD-10-CM

## 2013-02-14 DIAGNOSIS — E119 Type 2 diabetes mellitus without complications: Secondary | ICD-10-CM

## 2013-02-14 DIAGNOSIS — R209 Unspecified disturbances of skin sensation: Secondary | ICD-10-CM | POA: Diagnosis present

## 2013-02-14 DIAGNOSIS — Z0000 Encounter for general adult medical examination without abnormal findings: Secondary | ICD-10-CM

## 2013-02-14 DIAGNOSIS — N318 Other neuromuscular dysfunction of bladder: Secondary | ICD-10-CM

## 2013-02-14 DIAGNOSIS — Z860100 Personal history of colon polyps, unspecified: Secondary | ICD-10-CM

## 2013-02-14 DIAGNOSIS — R159 Full incontinence of feces: Secondary | ICD-10-CM

## 2013-02-14 DIAGNOSIS — Z780 Asymptomatic menopausal state: Secondary | ICD-10-CM

## 2013-02-14 DIAGNOSIS — R5381 Other malaise: Secondary | ICD-10-CM | POA: Diagnosis present

## 2013-02-14 DIAGNOSIS — I10 Essential (primary) hypertension: Secondary | ICD-10-CM | POA: Diagnosis present

## 2013-02-14 DIAGNOSIS — R8290 Unspecified abnormal findings in urine: Secondary | ICD-10-CM

## 2013-02-14 DIAGNOSIS — W57XXXA Bitten or stung by nonvenomous insect and other nonvenomous arthropods, initial encounter: Secondary | ICD-10-CM

## 2013-02-14 DIAGNOSIS — E1169 Type 2 diabetes mellitus with other specified complication: Secondary | ICD-10-CM | POA: Diagnosis present

## 2013-02-14 DIAGNOSIS — M545 Low back pain, unspecified: Secondary | ICD-10-CM

## 2013-02-14 DIAGNOSIS — N3941 Urge incontinence: Secondary | ICD-10-CM

## 2013-02-14 DIAGNOSIS — I2510 Atherosclerotic heart disease of native coronary artery without angina pectoris: Secondary | ICD-10-CM | POA: Diagnosis present

## 2013-02-14 DIAGNOSIS — N390 Urinary tract infection, site not specified: Secondary | ICD-10-CM

## 2013-02-14 DIAGNOSIS — R5383 Other fatigue: Secondary | ICD-10-CM | POA: Diagnosis present

## 2013-02-14 DIAGNOSIS — R202 Paresthesia of skin: Secondary | ICD-10-CM | POA: Diagnosis present

## 2013-02-14 DIAGNOSIS — I4891 Unspecified atrial fibrillation: Secondary | ICD-10-CM | POA: Diagnosis present

## 2013-02-14 DIAGNOSIS — I639 Cerebral infarction, unspecified: Secondary | ICD-10-CM

## 2013-02-14 DIAGNOSIS — J309 Allergic rhinitis, unspecified: Secondary | ICD-10-CM

## 2013-02-14 DIAGNOSIS — I6350 Cerebral infarction due to unspecified occlusion or stenosis of unspecified cerebral artery: Principal | ICD-10-CM | POA: Diagnosis present

## 2013-02-14 DIAGNOSIS — D649 Anemia, unspecified: Secondary | ICD-10-CM

## 2013-02-14 DIAGNOSIS — Z85828 Personal history of other malignant neoplasm of skin: Secondary | ICD-10-CM

## 2013-02-14 DIAGNOSIS — R197 Diarrhea, unspecified: Secondary | ICD-10-CM

## 2013-02-14 LAB — POCT I-STAT, CHEM 8
BUN: 14 mg/dL (ref 6–23)
Calcium, Ion: 1.15 mmol/L (ref 1.13–1.30)
Chloride: 107 mEq/L (ref 96–112)
Creatinine, Ser: 0.9 mg/dL (ref 0.50–1.10)
Glucose, Bld: 75 mg/dL (ref 70–99)
HCT: 43 % (ref 36.0–46.0)
Hemoglobin: 14.6 g/dL (ref 12.0–15.0)
Potassium: 5.3 mEq/L — ABNORMAL HIGH (ref 3.5–5.1)
Sodium: 140 mEq/L (ref 135–145)
TCO2: 27 mmol/L (ref 0–100)

## 2013-02-14 LAB — CBC WITH DIFFERENTIAL/PLATELET
Basophils Absolute: 0.1 10*3/uL (ref 0.0–0.1)
Basophils Relative: 1 % (ref 0–1)
Eosinophils Absolute: 0.3 10*3/uL (ref 0.0–0.7)
Eosinophils Relative: 5 % (ref 0–5)
HCT: 40.2 % (ref 36.0–46.0)
Hemoglobin: 14.3 g/dL (ref 12.0–15.0)
Lymphocytes Relative: 41 % (ref 12–46)
Lymphs Abs: 3 10*3/uL (ref 0.7–4.0)
MCH: 32.6 pg (ref 26.0–34.0)
MCHC: 35.6 g/dL (ref 30.0–36.0)
MCV: 91.8 fL (ref 78.0–100.0)
Monocytes Absolute: 0.6 10*3/uL (ref 0.1–1.0)
Monocytes Relative: 8 % (ref 3–12)
Neutro Abs: 3.4 10*3/uL (ref 1.7–7.7)
Neutrophils Relative %: 46 % (ref 43–77)
Platelets: 209 10*3/uL (ref 150–400)
RBC: 4.38 MIL/uL (ref 3.87–5.11)
RDW: 13.2 % (ref 11.5–15.5)
WBC: 7.3 10*3/uL (ref 4.0–10.5)

## 2013-02-14 LAB — GLUCOSE, CAPILLARY
Glucose-Capillary: 95 mg/dL (ref 70–99)
Glucose-Capillary: 93 mg/dL (ref 70–99)

## 2013-02-14 LAB — PROTIME-INR
INR: 0.95 (ref 0.00–1.49)
Prothrombin Time: 12.5 seconds (ref 11.6–15.2)

## 2013-02-14 MED ORDER — ACETAMINOPHEN 650 MG RE SUPP: 650.0000 mg | RECTAL | Status: DC | PRN

## 2013-02-14 MED ORDER — SODIUM CHLORIDE 0.9 % IV SOLN
INTRAVENOUS | Status: AC
  Administered 2013-02-14: 16:00:00 via INTRAVENOUS

## 2013-02-14 MED ORDER — NON FORMULARY: 40.0000 mg | Freq: Every morning | Status: DC

## 2013-02-14 MED ORDER — ACETAMINOPHEN 325 MG PO TABS
650.0000 mg | ORAL_TABLET | ORAL | Status: DC | PRN
  Administered 2013-02-14: 650 mg via ORAL
  Filled 2013-02-14: qty 2

## 2013-02-14 MED ORDER — CARVEDILOL 6.25 MG PO TABS
6.2500 mg | ORAL_TABLET | Freq: Two times a day (BID) | ORAL | Status: DC
  Administered 2013-02-14 – 2013-02-16 (×5): 6.25 mg via ORAL
  Filled 2013-02-14 (×8): qty 1

## 2013-02-14 MED ORDER — ENOXAPARIN SODIUM 40 MG/0.4ML ~~LOC~~ SOLN
40.0000 mg | SUBCUTANEOUS | Status: DC
  Administered 2013-02-14 – 2013-02-15 (×2): 40 mg via SUBCUTANEOUS
  Filled 2013-02-14 (×3): qty 0.4

## 2013-02-14 MED ORDER — PANTOPRAZOLE SODIUM 40 MG PO TBEC
40.0000 mg | DELAYED_RELEASE_TABLET | Freq: Every day | ORAL | Status: DC
  Administered 2013-02-14 – 2013-02-15 (×2): 40 mg via ORAL
  Filled 2013-02-14: qty 1

## 2013-02-14 MED ORDER — SENNOSIDES-DOCUSATE SODIUM 8.6-50 MG PO TABS: 1.0000 | ORAL_TABLET | Freq: Every evening | ORAL | Status: DC | PRN

## 2013-02-14 MED ORDER — TICLOPIDINE HCL 250 MG PO TABS
250.0000 mg | ORAL_TABLET | Freq: Two times a day (BID) | ORAL | Status: DC
Start: 2013-02-14 — End: 2013-02-16
  Administered 2013-02-14 – 2013-02-16 (×4): 250 mg via ORAL
  Filled 2013-02-14 (×6): qty 1

## 2013-02-14 MED ORDER — OLMESARTAN MEDOXOMIL 40 MG PO TABS
40.0000 mg | ORAL_TABLET | Freq: Every day | ORAL | Status: DC
  Administered 2013-02-15 – 2013-02-16 (×2): 40 mg via ORAL
  Filled 2013-02-14 (×2): qty 1

## 2013-02-14 MED ORDER — INSULIN ASPART 100 UNIT/ML ~~LOC~~ SOLN: 0.0000 [IU] | Freq: Every day | SUBCUTANEOUS | Status: DC

## 2013-02-14 MED ORDER — GLIPIZIDE ER 5 MG PO TB24
5.0000 mg | ORAL_TABLET | Freq: Every day | ORAL | Status: DC
  Filled 2013-02-14 (×2): qty 1

## 2013-02-14 MED ORDER — SODIUM CHLORIDE 0.9 % IV SOLN
INTRAVENOUS | Status: DC
  Administered 2013-02-14: 20:00:00 via INTRAVENOUS
  Administered 2013-02-15: 950 mL via INTRAVENOUS
  Administered 2013-02-15: 09:00:00 via INTRAVENOUS

## 2013-02-14 MED ORDER — INSULIN ASPART 100 UNIT/ML ~~LOC~~ SOLN
0.0000 [IU] | Freq: Three times a day (TID) | SUBCUTANEOUS | Status: DC
  Administered 2013-02-15: 1 [IU] via SUBCUTANEOUS

## 2013-02-14 NOTE — ED Notes (Signed)
Pt began experiencing R pointer finger numbness.  The numbness has been progressing to the point where she is unable to right.  AO x 4.  No facial palsy.  Poss hx of tia.  Sees [facility] ? As her neurologist.

## 2013-02-14 NOTE — Progress Notes (Signed)
Patient c/o right flank "cramping." Pt stated she thought she may be having a reaction to the dose of lovenox given. Injection site WNL no redness or swelling. Skin near flank area also WNL. Pharmacist called to resource regarding allergic side effects. He noted no cramping to be one of them. Patient medicated with tylenol and will reassess.

## 2013-02-14 NOTE — Telephone Encounter (Signed)
Pt left v/m; pt had numbness in rt hand; called pt back and numbness worsened in rt hand with heavy feeling and weakness in rt hand; pt's husband took pt to [facility] ED; pt is there now waiting to get a scan. Pt will call back with update later.

## 2013-02-14 NOTE — H&P (Signed)
[facility] History and Physical  [name]:[id number] DOB: April 12, 1936 DOA: 02/14/2013  Referring physician:  PCP: Roxy Manns, MD  Specialists:   Chief Complaint: intermittent Right upper extremity numbness and tingling/weakness  HPI: Margaret Vazquez is a 77 y.o. female with past medical history significant for hypertension, diabetes mellitus and hyperlipidemia who presents with above complaints. She has states that she's had numbness and tingling in her right upper extremity intermittently since Friday. She also reports that that right arm felt lifeless/weak. She denies headaches, slurred speech, blurry vision, dysphagia. Today she states that it like these symptoms were worsening and so she called her PCP, and then and decided to come to the ED. She was seen in the ED and he CT scan of her brain showed no acute intracranial process, extensive sinus opacification with air-fluid level within the left maxillary sinus-possible acute sinusitis was noted. Neuro was consulted per EDP and she is admitted for further evaluation and management.   Review of Systems: The patient denies anorexia, fever, weight loss,, vision loss, decreased hearing, hoarseness, chest pain, syncope, dyspnea on exertion, peripheral edema, balance deficits, hemoptysis, abdominal pain, melena, hematochezia, severe indigestion/heartburn, hematuria, incontinence, transient blindness, difficulty walking, depression, unusual weight change, abnormal bleeding,   Past Medical History  Diagnosis Date  . Hypertension   . Diabetes mellitus     type II  . Hyperlipidemia     myalgias with Lipitor and Zetia  . GERD (gastroesophageal reflux disease)   . Skin cancer     hx of basal cell/ak's  . Colon polyps 2009  . Allergy history, drug     Aspirin  . Cervical stenosis of spine     With neck pain  . CAD (coronary artery disease) 04/08/11    non obst by cath  . Migraine    Past Surgical History  Procedure Laterality  Date  . Appendectomy    . Tubal ligation      BTL  . Breast surgery  1992    breast biopsy  . Eye surgery  2005    tear duct surgery  . Nasal sinus surgery  01/2005  . Cardiac catheterization  1993    LHC: normal coronaries.   . Stress cardiolite  11/1999    Normal/ negative  . Cystoscopy w/ decannulation  03/2000    Normal  . Shoulder surgery  10/2002    Impingement  . Abd [place]  07/2003    Negative  . Tear duct surgery  2005  . Colonoscopy  12/2007    Adenomatous colon polyps  . Cardiac catheterization  04/07/2011    non obst CAD (Dr Excell Seltzer)   Social History:  reports that she has never smoked. She has never used smokeless tobacco. She reports that she does not drink alcohol or use illicit drugs. where does patient live--home  Can patient participate in ADLs-yes  Allergies  Allergen Reactions  . Bee Venom Hives, Shortness Of Breath and Swelling  . Nabumetone Anaphylaxis  . Amoxicillin-Pot Clavulanate Hives and Swelling    To lips.  . Aspirin Hives  . Atorvastatin Swelling     joint pain/swelling, inc liver tests  . Clopidogrel Bisulfate Hives  . Codeine Nausea And Vomiting  . Ezetimibe Other (See Comments)     fatigue  . Metformin And Related Other (See Comments)    Diarrhea   . Valsartan Other (See Comments)     fatigue    Family History  Problem Relation Age of Onset  .  Lung cancer Brother   . Colon cancer Neg Hx   . Skin cancer Daughter   . Diabetes Sister   . Diabetes Brother     Prior to Admission medications   Medication Sig Start Date End Date Taking? Authorizing Provider  carvedilol (COREG) 6.25 MG tablet Take 1 tablet (6.25 mg total) by mouth 2 (two) times daily with a meal. 10/27/12  Yes Marne A Tower, MD  glipiZIDE (GLUCOTROL XL) 5 MG 24 hr tablet Take 1 tablet (5 mg total) by mouth daily. 10/27/12 01/17/14 Yes Marne A Tower, MD  olmesartan (BENICAR) 40 MG tablet Take 1 tablet (40 mg total) by mouth daily. 10/27/12  Yes Judy Pimple, MD   cholestyramine Lanetta Inch) 4 G packet 1 packet as needed. diarrhea 08/24/12   Historical Provider, MD  EPINEPHrine (EPI-PEN) 0.3 mg/0.3 mL DEVI Inject 0.3 mg into the muscle once. 12/20/10   Judy Pimple, MD   Physical Exam: Filed Vitals:   02/14/13 1615  BP: 196/77  Pulse: 65  Temp:   Resp:    Constitutional: Vital signs reviewed.  Patient is a well-developed and well-nourished  in no acute distress and cooperative with exam. Alert and oriented x3.  Head: Normocephalic and atraumatic Nose: No erythema or drainage noted.  Mouth: no erythema or exudates, MMM Eyes: PERRL, EOMI, conjunctivae normal, No scleral icterus.  Neck: Supple, Trachea midline normal ROM, No JVD, mass, thyromegaly, or carotid bruit present.  Cardiovascular: RRR, S1 normal, S2 normal, no MRG, pulses symmetric and intact bilaterally Pulmonary/Chest: normal respiratory effort, CTAB, no wheezes, rales, or rhonchi Abdominal: Soft. Non-tender, non-distended, bowel sounds are normal, no masses, organomegaly, or guarding present.   Extremities: No cyanosis and no EDEMA Neurological: A&O x3, right upper right lower extremity strength 4+/5, left-sided strength 5/5cranial nerve II-XII are grossly intact, sensory grossly intact Skin: Warm, dry and intact. No rash.  Psychiatric: Normal mood and affect. speech and behavior is normal. Judgment and thought content normal. Cognition and memory are normal.     Labs on Admission:  Basic Metabolic Panel:  Recent Labs Lab 02/14/13 1458  NA 140  K 5.3*  CL 107  GLUCOSE 75  BUN 14  CREATININE 0.90   Liver Function Tests: No results found for this basename: AST, ALT, ALKPHOS, BILITOT, PROT, ALBUMIN,  in the last 168 hours No results found for this basename: LIPASE, AMYLASE,  in the last 168 hours No results found for this basename: AMMONIA,  in the last 168 hours CBC:  Recent Labs Lab 02/14/13 1407 02/14/13 1458  WBC 7.3  --   NEUTROABS 3.4  --   HGB 14.3 14.6  HCT  40.2 43.0  MCV 91.8  --   PLT 209  --    Cardiac Enzymes: No results found for this basename: CKTOTAL, CKMB, CKMBINDEX, TROPONINI,  in the last 168 hours  BNP (last 3 results) No results found for this basename: PROBNP,  in the last 8760 hours CBG:  Recent Labs Lab 02/14/13 1357  GLUCAP 95    Radiological Exams on Admission: Ct Head (brain) Wo Contrast  02/14/2013   *RADIOLOGY REPORT*  Clinical Data: Right-sided weakness, history of stroke  CT HEAD WITHOUT CONTRAST  Technique:  Contiguous axial images were obtained from the base of the skull through the vertex without contrast.  Comparison: 08/22/2011; 04/09/2011  Findings:  Wallace Cullens white differentiation is maintained.  No CT evidence of acute large territory infarct.  No intraparenchymal or extra-axial mass or hemorrhage.  Normal size  and configuration of the ventricles and basilar cisterns.  No midline shift.  There is near complete opacification of the right maxillary and frontal sinuses. There is scattered opacification of the right ethmoidal air cells.  An air fluid level is noted within the left maxillary sinus. There is normal aeration of the bilateral mastoid air cells.  Regional soft tissues are normal. Mild hyperostosis frontalis.   No displaced calvarial fracture.  IMPRESSION: 1.  No acute intracranial process.  2.  Extensive sinus opacification disease as above with air fluid level within the left maxillary sinus, nonspecific but could be seen in the setting of acute sinusitis.   Original Report Authenticated By: Tacey Ruiz, MD      Assessment/Plan Active Problems:   Numbness and tingling of right arm/R.weakness - As discussed above, will admit for CVA workup -CT scan of head neg for intracranial abnormalities -Obtain MRI MRA/carotid Doppler us/2-D echo/hemoglobin A1c/fasting lipid profile -Patient is allergic to both ASA and Plavix-so will avoid   DIABETES MELLITUS, TYPE II -Continue glipizide, monitor Accu-Cheks cover  with sliding scale insulin   HYPERLIPIDEMIA -Check fasting lipid profile, follow and treat accordingly   HYPERTENSION, ESSENTIAL NOS -Monitor blood pressures, allow permissive hypertension pending MRI.   H/oGERD -Place on PPI.     Code Status:full Family Communication:husband at bedside Disposition Plan:admit to neuro tele  Time spent: >30  Kela Millin [facility] Pager [phone]  If 7PM-7AM, please contact night-coverage [email or web address] Password [facility] 02/14/2013, 5:31 PM

## 2013-02-14 NOTE — ED Provider Notes (Signed)
CSN: [id number]     Arrival date & time 02/14/13  1308 History     First MD Initiated Contact with Patient 02/14/13 1324     Chief Complaint  Patient presents with  . Numbness   (Consider location/radiation/quality/duration/timing/severity/associated sxs/prior Treatment) HPI 77 y o w f presented with PMH- DM2- well controlled, HTN, lumber back pain, overactive bladder, hyperlipidemia. Presented today w c/o numbness and heaviness, which she noticed on Friday night( 3 days ago) before she went to bed. The next day, she noticed continued numbness, reports it was off and on. No slurred speech, no facial assym or visual changes. No abnormal gait, or weakness in her lower limbs. No falls, no hx of trauma, no signif hx of cardiac dx or abnormal heart rhythm. She is allergic to aspirin and clopidogrel. She was still able to perform her normal daily activities with both arms. She is very compliant with her medications- glipizide for DM2, sugars usually run in the low 100s. She said she doesn't smoke or take alcohol, or use drugs.   Past Medical History  Diagnosis Date  . Hypertension   . Diabetes mellitus     type II  . Hyperlipidemia     myalgias with Lipitor and Zetia  . GERD (gastroesophageal reflux disease)   . Skin cancer     hx of basal cell/ak's  . Colon polyps 2009  . Allergy history, drug     Aspirin  . Cervical stenosis of spine     With neck pain  . CAD (coronary artery disease) 04/08/11    non obst by cath  . Migraine    Past Surgical History  Procedure Laterality Date  . Appendectomy    . Tubal ligation      BTL  . Breast surgery  1992    breast biopsy  . Eye surgery  2005    tear duct surgery  . Nasal sinus surgery  01/2005  . Cardiac catheterization  1993    LHC: normal coronaries.   . Stress cardiolite  11/1999    Normal/ negative  . Cystoscopy w/ decannulation  03/2000    Normal  . Shoulder surgery  10/2002    Impingement  . Abd [place]  07/2003    Negative  .  Tear duct surgery  2005  . Colonoscopy  12/2007    Adenomatous colon polyps  . Cardiac catheterization  04/07/2011    non obst CAD (Dr Excell Seltzer)   Family History  Problem Relation Age of Onset  . Lung cancer Brother   . Colon cancer Neg Hx   . Skin cancer Daughter   . Diabetes Sister   . Diabetes Brother    History  Substance Use Topics  . Smoking status: Never Smoker   . Smokeless tobacco: Never Used  . Alcohol Use: No   OB History   Grav Para Term Preterm Abortions TAB SAB Ect Mult Living                 Review of Systems No pertinent findings on ROS.  Allergies  Bee venom; Nabumetone; Amoxicillin-pot clavulanate; Aspirin; Atorvastatin; Clopidogrel bisulfate; Codeine; Ezetimibe; Metformin and related; and Valsartan  Home Medications   Current Outpatient Rx  Name  Route  Sig  Dispense  Refill  . carvedilol (COREG) 6.25 MG tablet   Oral   Take 1 tablet (6.25 mg total) by mouth 2 (two) times daily with a meal.   60 tablet   11   . glipiZIDE (  GLUCOTROL XL) 5 MG 24 hr tablet   Oral   Take 1 tablet (5 mg total) by mouth daily.   30 tablet   11   . olmesartan (BENICAR) 40 MG tablet   Oral   Take 1 tablet (40 mg total) by mouth daily.   30 tablet   11   . cholestyramine (QUESTRAN) 4 G packet      1 packet as needed. diarrhea         . EPINEPHrine (EPI-PEN) 0.3 mg/0.3 mL DEVI   Intramuscular   Inject 0.3 mg into the muscle once.          BP 164/69  Pulse 63  Temp(Src) 98.2 F (36.8 C) (Oral)  Resp 22  SpO2 97% Physical Exam  GENERAL- Alert, co-op, not in any distress. HEENT- Normocephalic, atraumatic, pupils- equal round, not reactive to light, EOMI, mucosal moist, no deviation of the uvula, no carotid bruit, no facial assym. CARDIAC- RRR, no murmurs, rubs or gallops. RESP- Clear to auscultation bilat, no rales, murmurs or gallops ABD- Soft, non tender, no masses, or organomegaly, bowel sounds normoactive. NEURO- CR n 2-12 intact. Strenght in LLE  and LLE- 5/5.  Strenght in RUE and RLE- 4/5. Finger to nose test- R and L normal. Gait normal, a bit slow. Sensation intact. EXTREM- pulses reduced in both lower extremities, no pedal edema.  ED Course   Procedures (including critical care time)  Labs Reviewed  POCT I-STAT, CHEM 8 - Abnormal; Notable for the following:    Potassium 5.3 (*)    All other components within normal limits  GLUCOSE, CAPILLARY  CBC WITH DIFFERENTIAL  PROTIME-INR   Ct Head (brain) Wo Contrast  02/14/2013   *RADIOLOGY REPORT*  Clinical Data: Right-sided weakness, history of stroke  CT HEAD WITHOUT CONTRAST  Technique:  Contiguous axial images were obtained from the base of the skull through the vertex without contrast.  Comparison: 08/22/2011; 04/09/2011  Findings:  Wallace Cullens white differentiation is maintained.  No CT evidence of acute large territory infarct.  No intraparenchymal or extra-axial mass or hemorrhage.  Normal size and configuration of the ventricles and basilar cisterns.  No midline shift.  There is near complete opacification of the right maxillary and frontal sinuses. There is scattered opacification of the right ethmoidal air cells.  An air fluid level is noted within the left maxillary sinus. There is normal aeration of the bilateral mastoid air cells.  Regional soft tissues are normal. Mild hyperostosis frontalis.   No displaced calvarial fracture.  IMPRESSION: 1.  No acute intracranial process.  2.  Extensive sinus opacification disease as above with air fluid level within the left maxillary sinus, nonspecific but could be seen in the setting of acute sinusitis.   Original Report Authenticated By: Tacey Ruiz, MD   1. Stroke     MDM   CVA With Left sided weakness, concerns for ischemic stroke-  - Head CT- negative for hemorrhagic stroke. - MRI of the head Wo contrast- Pending - BMP- K- 5.3, blood sugar- 75.  - CBC- WNL. - NPO- Pending swallow test. - IV n/s 64mls/hr. - Admit to Internal  medicine service for stroke workup. - Consult Neurology.    Kennis Carina, MD 02/14/13 [phone]

## 2013-02-14 NOTE — Telephone Encounter (Signed)
Saw the ED note- waiting on MRI and I will follow that

## 2013-02-14 NOTE — Consult Note (Signed)
Neurology Consultation Reason for Consult: Right arm weakness Referring Physician: Dillard Essex, A  CC: Right arm weakness  History is obtained from: Patient, husband  HPI: Margaret Vazquez is a 78 y.o. female who over the past couple of days has had intermittent episodes of right arm numbness, tingling, dysfunction that has been transient up until this morning at which point she has been having persistent problems. Her husband states that she was having difficulty yesterday with eating with her fork. Today, she noticed that she was having difficulty writing and therefore salt evaluation in the emergency room   LKW: Unclear if symptoms never completely resolved over the past few days tpa given: no, outside of window, mild symptoms NIH: 0    ROS: A 14 point ROS was performed and is negative except as noted in the HPI.  Past Medical History  Diagnosis Date  . Hypertension   . Diabetes mellitus     type II  . Hyperlipidemia     myalgias with Lipitor and Zetia  . GERD (gastroesophageal reflux disease)   . Skin cancer     hx of basal cell/ak's  . Colon polyps 2009  . Allergy history, drug     Aspirin  . Cervical stenosis of spine     With neck pain  . CAD (coronary artery disease) 04/08/11    non obst by cath  . Migraine     Family History: Brother-lung cancer  Social History: Tob: Never smoker  Exam: Current vital signs: BP 194/72  Pulse 68  Temp(Src) 98.4 F (36.9 C) (Oral)  Resp 18  Ht '5\' 9"'$  (1.753 m)  Wt 95.437 kg (210 lb 6.4 oz)  BMI 31.06 kg/m2  SpO2 98% Vital signs in last 24 hours: Temp:  [98.1 F (36.7 C)-98.4 F (36.9 C)] 98.4 F (36.9 C) (08/11 2018) Pulse Rate:  [63-69] 68 (08/11 2018) Resp:  [18-22] 18 (08/11 2018) BP: (164-211)/(69-77) 194/72 mmHg (08/11 2018) SpO2:  [95 %-98 %] 98 % (08/11 2018) Weight:  [95.437 kg (210 lb 6.4 oz)] 95.437 kg (210 lb 6.4 oz) (08/11 2018)  General: In bed, NAD CV: Regular rate and rhythm Mental Status: Patient is  awake, alert, oriented to person, place, month, year, and situation. Immediate and remote memory are intact. Patient is able to give a clear and coherent history. No signs of aphasia or neglect Cranial Nerves: II: Visual Fields are full. Pupils are equal, round, and reactive to light.  Discs are difficult to visualize. III,IV, VI: EOMI without ptosis or diploplia.  V: Facial sensation is symmetric to temperature VII: Facial movement is symmetric.  VIII: hearing is intact to voice X: Uvula elevates symmetrically XI: Shoulder shrug is symmetric. XII: tongue is midline without atrophy or fasciculations.  Motor: Tone is normal. Bulk is normal. 5/5 strength was present in left arm, bilateral legs and proximal right arm. She has some difficulty with fine motor movements of her right hand. Sensory: Sensation is symmetric to light touch and temperature in the arms and legs. Deep Tendon Reflexes: 2+ and symmetric in the biceps and patellae.  Plantars: Toes are downgoing bilaterally.  Cerebellar: FNF and HKS are intact bilaterally Gait: Not tested due to patient safety concern   I have reviewed labs in epic and the results pertinent to this consultation are: chem 8-elevated potassium  I have reviewed the images obtained: MRI brain-Small ischemic strokes in the posterior division of the MCA on the left. She has stenosis of her MCA on that side,  which I do not feel is is seen, at least not to the degree on her study of 2011.  Impression: 77 year old female with multifocal infarcts in the left posterior division of the MCA. Possibilities include intracranial atherosclerotic disease, partially occlusive embolus. At this time, I would recommend allowing her to have permissive hypertension and I feel that she needs antiplatelet therapy.  She has had allergic reactions including hives and flushing with both aspirin as well as Plavix in the past. She has tolerated Ticlid well and therefore I would  favor using this at this time  Recommendations: 1. HgbA1c, fasting lipid panel 2. Frequent neuro checks 3. Echocardiogram 4. Carotid dopplers 5. Prophylactic - Ticlid '250mg'$  BID 6. Risk factor modification 7. Telemetry monitoring 8. PT consult, OT consult, Speech consult   Roland Rack, MD [facility] [phone]  If 7pm- 7am, please page neurology on call at [phone].

## 2013-02-15 DIAGNOSIS — I369 Nonrheumatic tricuspid valve disorder, unspecified: Secondary | ICD-10-CM

## 2013-02-15 DIAGNOSIS — Z8673 Personal history of transient ischemic attack (TIA), and cerebral infarction without residual deficits: Secondary | ICD-10-CM

## 2013-02-15 DIAGNOSIS — I6350 Cerebral infarction due to unspecified occlusion or stenosis of unspecified cerebral artery: Principal | ICD-10-CM

## 2013-02-15 DIAGNOSIS — E785 Hyperlipidemia, unspecified: Secondary | ICD-10-CM

## 2013-02-15 LAB — GLUCOSE, CAPILLARY
Glucose-Capillary: 110 mg/dL — ABNORMAL HIGH (ref 70–99)
Glucose-Capillary: 102 mg/dL — ABNORMAL HIGH (ref 70–99)
Glucose-Capillary: 134 mg/dL — ABNORMAL HIGH (ref 70–99)
Glucose-Capillary: 136 mg/dL — ABNORMAL HIGH (ref 70–99)

## 2013-02-15 LAB — BASIC METABOLIC PANEL
BUN: 12 mg/dL (ref 6–23)
CO2: 24 mEq/L (ref 19–32)
Calcium: 8.9 mg/dL (ref 8.4–10.5)
Chloride: 106 mEq/L (ref 96–112)
Creatinine, Ser: 0.79 mg/dL (ref 0.50–1.10)
GFR calc Af Amer: >90 mL/min (ref >90)
GFR calc non Af Amer: 79 mL/min — ABNORMAL LOW (ref >90)
Glucose, Bld: 112 mg/dL — ABNORMAL HIGH (ref 70–99)
Potassium: 4.1 mEq/L (ref 3.5–5.1)
Sodium: 141 mEq/L (ref 135–145)

## 2013-02-15 LAB — LIPID PANEL
Cholesterol: 172 mg/dL (ref 0–200)
HDL: 41 mg/dL (ref >39)
LDL Cholesterol: 99 mg/dL (ref 0–99)
Total CHOL/HDL Ratio: 4.2 RATIO
Triglycerides: 161 mg/dL — ABNORMAL HIGH (ref <150)
VLDL: 32 mg/dL (ref 0–40)

## 2013-02-15 LAB — HEMOGLOBIN A1C
Hgb A1c MFr Bld: 6.5 % — ABNORMAL HIGH (ref <5.7)
Mean Plasma Glucose: 140 mg/dL — ABNORMAL HIGH (ref <117)

## 2013-02-15 NOTE — Evaluation (Signed)
Physical Therapy Evaluation Patient Details Name: Margaret Vazquez MRN: [id number] DOB: Mar 10, 1936 Today's Date: 02/15/2013 Time: [id number] PT Time Calculation (min): 24 min  PT Assessment / Plan / Recommendation History of Present Illness  Patient is a 77 y/o female admitted with right UE numbness, tingling and dysfunction.  MRI positive for mutifocal infarcts left post MCA division affecting the left posterior frontal and parietal cortex and underlying white matter  Clinical Impression  Patient presents with decreased balance, decreased activity tolerance, decreased strength all affecting independent ambulation.  Will benefit from skilled PT in the acute setting to address these deficits and achieve maximal independent level for d/c home with spouse assist and HHPT.    PT Assessment  Patient needs continued PT services    Follow Up Recommendations  Home health PT;Supervision/Assistance - 24 hour       Barriers to Discharge  wife separately indicated concern over spouse potentially starting to show signs of dementia    Equipment Recommendations  Cane;Rolling walker with 5" wheels;Other (comment) (TBA which is needed for home)    Recommendations for Other Services   None  Frequency Min 4X/week    Precautions / Restrictions Precautions Precautions: Fall   Pertinent Vitals/Pain Denies pain      Mobility  Bed Mobility Bed Mobility: Supine to Sit;Sit to Supine;Sitting - Scoot to Edge of Bed Supine to Sit: 6: Modified independent (Device/Increase time) Sitting - Scoot to Edge of Bed: 6: Modified independent (Device/Increase time) Sit to Supine: 6: Modified independent (Device/Increase time) Transfers Transfers: Sit to Stand;Stand to Sit Sit to Stand: From bed;From toilet;With upper extremity assist;6: Modified independent (Device/Increase time) Stand to Sit: To toilet;To bed;With upper extremity assist;6: Modified independent (Device/Increase time) Details for Transfer  Assistance: reports moving to bathroom without difficulty Ambulation/Gait Ambulation/Gait Assistance: 5: Supervision;4: Min guard Ambulation Distance (Feet): 150 Feet Assistive device: None Ambulation/Gait Assistance Details: occasionally reaching for wall rail; c/o unsteadiness on her feet, performed head turns and nods with slower speed, increased speed only with hand hold assist, able to turn and stop increased time without loss of balance; encouraged hallway ambulation with nursing assist for increased activity tolerance, strength and balance Gait Pattern: Step-through pattern;Decreased stride length;Narrow base of support;Shuffle Stairs: Yes Stairs Assistance: 5: Supervision Stairs Assistance Details (indicate cue type and reason): for safety Stair Management Technique: Alternating pattern;Two rails Number of Stairs: 5 Modified Rankin (Stroke Patients Only) Pre-Morbid Rankin Score: No symptoms Modified Rankin: Moderately severe disability        PT Diagnosis: Abnormality of gait;Generalized weakness  PT Problem List: Decreased strength;Decreased activity tolerance;Decreased balance;Decreased mobility;Decreased safety awareness;Decreased knowledge of use of DME PT Treatment Interventions: DME instruction;Balance training;Gait training;Neuromuscular re-education;Stair training;Functional mobility training;Patient/family education;Therapeutic activities;Therapeutic exercise     PT Goals(Current goals can be found in the care plan section) Acute Rehab PT Goals Patient Stated Goal: To return home to independent PT Goal Formulation: With patient Time For Goal Achievement: 02/22/13 Potential to Achieve Goals: Good  Visit Information  Last PT Received On: 02/15/13 Assistance Needed: +1 History of Present Illness: Patient is a 77 y/o female admitted with right UE numbness, tingling and dysfunction.  MRI positive for mutifocal infarcts left post MCA division affecting the left posterior  frontal and parietal cortex and underlying white matter       Prior Functioning  Home Living Family/patient expects to be discharged to:: Private residence Available Help at Discharge: Family Type of Home: House Home Access: Stairs to enter [employer] of Steps: 4 Entrance  Stairs-Rails: Left Home Layout: Two level;Able to live on main level with bedroom/bathroom;Laundry or work area in basement Alternate Therapist, sports of Steps: flight Alternate Level Stairs-Rails: Left Home Equipment: Environmental consultant - 2 wheels;Bedside commode  Lives With: Spouse Prior Function Level of Independence: Independent Comments: works outside, 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 Dominant Hand: Right    Cognition  Cognition Arousal/Alertness: Awake/alert Behavior During Therapy: WFL for tasks assessed/performed Overall Cognitive Status: Within Functional Limits for tasks assessed    Extremity/Trunk Assessment Upper Extremity Assessment Upper Extremity Assessment: Defer to OT evaluation Lower Extremity Assessment Lower Extremity Assessment: RLE deficits/detail;LLE deficits/detail RLE Deficits / Details: AROM WFL, strength hip flextion 3-/5, knee extension 4/5, flexion 4-/5, ankle DF 4-/5 LLE Deficits / Details: AROM, strength WFL   Balance Balance Balance Assessed: Yes Dynamic Sitting Balance Dynamic Sitting - Balance Support: Feet supported;During functional activity Dynamic Sitting - Level of Assistance: 5: Stand by assistance Dynamic Sitting - Balance Activities: Forward lean/weight shifting Static Standing Balance Static Standing - Balance Support: No upper extremity supported Static Standing - Level of Assistance: 5: Stand by assistance  End of Session PT - End of Session Equipment Utilized During Treatment: Gait belt Activity Tolerance: Patient limited by fatigue Patient left: in bed;with call bell/phone within reach;with family/visitor present  GP      [facility] 02/15/2013, 12:25 PM Magda Kiel, [place] 02/15/2013

## 2013-02-15 NOTE — Progress Notes (Signed)
VASCULAR LAB PRELIMINARY  PRELIMINARY  PRELIMINARY  PRELIMINARY  Carotid duplex  completed.    Preliminary report:  Bilateral:  1-39% ICA stenosis.  Vertebral artery flow is antegrade.      Dene Landsberg, RVT 02/15/2013, 3:05 PM

## 2013-02-15 NOTE — Progress Notes (Signed)
*  PRELIMINARY RESULTS* Echocardiogram 2D Echocardiogram has been performed.  Margaret Vazquez 02/15/2013, 10:41 AM

## 2013-02-15 NOTE — Progress Notes (Signed)
   CARE MANAGEMENT NOTE 02/15/2013  Patient:  Margaret Vazquez, Margaret Vazquez   Account Number:  [id number]  Date Initiated:  02/15/2013  Documentation initiated by:  Jiles Crocker  Subjective/Objective Assessment:   ADMITTED WITH STROKE     Action/Plan:   PCP: Roxy Manns, MD  LIVES A THOME WITH SPOUSE; CM FOLLOWING FOR DCP   Anticipated DC Date:  02/22/2013   Anticipated DC Plan:  POSSIBLY HOME W HOME HEALTH SERVICES      DC Planning Services  CM consult            Status of service:  In process, will continue to follow Medicare Important Message given?  NA - LOS <3 / Initial given by admissions (If response is "NO", the following Medicare IM given date fields will be blank)  Per UR Regulation:  Reviewed for med. necessity/level of care/duration of stay  Comments:  02/15/2013- B Keanthony Poole RN,BSN,MHA

## 2013-02-15 NOTE — Progress Notes (Signed)
[facility] PROGRESS NOTE  RUSTI ARIZMENDI [id number] DOB: 17-Dec-1935 DOA: 02/14/2013 PCP: Roxy Manns, MD  Assessment/Plan: Acute nonhemorrhagic stroke -Concerned about embolic phenomena -MRI brain shows multifocal acute infarct left posterior frontal and parietal lobes -MRI brain shows severe diffuse disease left MCA, M1 segment -discussed with Dr. Pearlean Brownie -Plan for TEE and loop recorder -Continue Ticlid (allergy to ASA and plavix)--due to insurance issues, the patient has been off this medication for about 1year -Followup carotid ultrasound -LDL 99 Diabetes mellitus type 2 -Continue NovoLog sliding scale -Patient was on glipizide preadmission -hemoglobin A1c 6.5 Hypertension -Continue Benicar nonobstructive coronary artery disease -Continue carvedilol Hyperlipidemia -Patient has numerous intolerances to statins -Continue Questran Family Communication:   husband at beside Disposition Plan:   Home when medically stable      Procedures/Studies: Dg Chest 2 View  02/14/2013   *RADIOLOGY REPORT*  Clinical Data: Stroke, right-sided weakness  CHEST - 2 VIEW  Comparison: 08/05/2012; 04/07/2011; chest CT - 04/07/2011  Findings:  Grossly unchanged cardiac silhouette and mediastinal contours.  The lungs are hyperexpanded with flattening of bilateral hemidiaphragms mild diffuse thickening of the pulmonary interstitium.  Unchanged minimal left basilar linear opacities favored to represent atelectasis.  No new focal airspace opacity.  No pleural effusion or pneumothorax.  No evidence of edema.  Unchanged bones.  IMPRESSION: Hyperexpanded lungs without acute cardiopulmonary disease.   Original Report Authenticated By: Tacey Ruiz, MD   Ct Head (brain) Wo Contrast  02/14/2013   *RADIOLOGY REPORT*  Clinical Data: Right-sided weakness, history of stroke  CT HEAD WITHOUT CONTRAST  Technique:  Contiguous axial images were obtained from the base of the skull through the vertex without  contrast.  Comparison: 08/22/2011; 04/09/2011  Findings:  Wallace Cullens white differentiation is maintained.  No CT evidence of acute large territory infarct.  No intraparenchymal or extra-axial mass or hemorrhage.  Normal size and configuration of the ventricles and basilar cisterns.  No midline shift.  There is near complete opacification of the right maxillary and frontal sinuses. There is scattered opacification of the right ethmoidal air cells.  An air fluid level is noted within the left maxillary sinus. There is normal aeration of the bilateral mastoid air cells.  Regional soft tissues are normal. Mild hyperostosis frontalis.   No displaced calvarial fracture.  IMPRESSION: 1.  No acute intracranial process.  2.  Extensive sinus opacification disease as above with air fluid level within the left maxillary sinus, nonspecific but could be seen in the setting of acute sinusitis.   Original Report Authenticated By: Tacey Ruiz, MD   Mr [facility] Wo Contrast  02/14/2013   *RADIOLOGY REPORT*  Clinical Data:  Right hand weakness and numbness beginning yesterday. History of diabetes mellitus.  History of hypertension.  MRI HEAD WITHOUT CONTRAST MRA HEAD WITHOUT CONTRAST  Technique:  Multiplanar, multiecho pulse sequences of the brain and surrounding structures were obtained without intravenous contrast. Angiographic images of the head were obtained using MRA technique without contrast.  Comparison:  CT head 02/14/2013.  MRI/MRA head 08/10/2009.  MRI HEAD  Findings:  Multifocal subcentimeter areas of acute infarction affect the left posterior frontal and parietal cortex and underlying white matter. These are oriented in a radial fashion between the left MCA and PCA territories, suggesting a watershed type insult.  There is no associated hemorrhage or mass effect.  No intracranial mass lesion or hydrocephalus is seen.  Mild atrophy is present.  There is mild chronic microvascular ischemic change in the periventricular and  subcortical white matter.  Calvarium intact.  No skull base or upper cervical lesions. Flow voids are maintained in the major intracranial vascular structures.  No pituitary abnormality or cerebellar tonsillar herniation.  Moderate sinus disease affects the maxillary and ethmoid regions, greater on the right. Air-fluid level left maxillary sinus.  There is also significant fluid accumulation in the right frontal sinus. Mucosal thickening noted in the sphenoid sinus.  Negative orbits.  Compared with prior CT, the infarcts are not visible.  Compared with prior MR, the severe disease of the left middle cerebral artery has progressed.  IMPRESSION: Multifocal areas of acute infarction affect the left posterior frontal and parietal cortex and underlying white matter.  Mild atrophy and chronic microvascular ischemic change.  Pansinusitis, worse on the right.  MRA HEAD  Findings: Widely patent internal carotid arteries.  Widely patent basilar artery with left vertebral dominant.  Right vertebral ends in PICA.  No proximal stenosis of the anterior cerebral arteries, posterior cerebral arteries, or right middle cerebral artery.  Severe diffuse disease of the left middle cerebral artery M1 segment with multiple tandem stenoses estimated to be 75-90% stenosis. Diminished flow related enhancement of the left MCA trifurcation vessels.  No intracranial aneurysm is demonstrated.  IMPRESSION: Severe proximal intracranial atherosclerotic change affecting the M1 segment left middle cerebral artery.   Original Report Authenticated By: Davonna Belling, M.D.   Mr Brain Wo Contrast  02/14/2013   *RADIOLOGY REPORT*  Clinical Data:  Right hand weakness and numbness beginning yesterday. History of diabetes mellitus.  History of hypertension.  MRI HEAD WITHOUT CONTRAST MRA HEAD WITHOUT CONTRAST  Technique:  Multiplanar, multiecho pulse sequences of the brain and surrounding structures were obtained without intravenous contrast. Angiographic  images of the head were obtained using MRA technique without contrast.  Comparison:  CT head 02/14/2013.  MRI/MRA head 08/10/2009.  MRI HEAD  Findings:  Multifocal subcentimeter areas of acute infarction affect the left posterior frontal and parietal cortex and underlying white matter. These are oriented in a radial fashion between the left MCA and PCA territories, suggesting a watershed type insult.  There is no associated hemorrhage or mass effect.  No intracranial mass lesion or hydrocephalus is seen.  Mild atrophy is present.  There is mild chronic microvascular ischemic change in the periventricular and subcortical white matter.  Calvarium intact.  No skull base or upper cervical lesions. Flow voids are maintained in the major intracranial vascular structures.  No pituitary abnormality or cerebellar tonsillar herniation.  Moderate sinus disease affects the maxillary and ethmoid regions, greater on the right. Air-fluid level left maxillary sinus.  There is also significant fluid accumulation in the right frontal sinus. Mucosal thickening noted in the sphenoid sinus.  Negative orbits.  Compared with prior CT, the infarcts are not visible.  Compared with prior MR, the severe disease of the left middle cerebral artery has progressed.  IMPRESSION: Multifocal areas of acute infarction affect the left posterior frontal and parietal cortex and underlying white matter.  Mild atrophy and chronic microvascular ischemic change.  Pansinusitis, worse on the right.  MRA HEAD  Findings: Widely patent internal carotid arteries.  Widely patent basilar artery with left vertebral dominant.  Right vertebral ends in PICA.  No proximal stenosis of the anterior cerebral arteries, posterior cerebral arteries, or right middle cerebral artery.  Severe diffuse disease of the left middle cerebral artery M1 segment with multiple tandem stenoses estimated to be 75-90% stenosis. Diminished flow related enhancement of the left MCA trifurcation  vessels.  No intracranial aneurysm is demonstrated.  IMPRESSION: Severe proximal intracranial atherosclerotic change affecting the M1 segment left middle cerebral artery.   Original Report Authenticated By: Davonna Belling, M.D.   Mr Cervical Spine Wo Contrast  02/14/2013   *RADIOLOGY REPORT*  Clinical Data: Right hand weakness and numbness.  MRI CERVICAL SPINE WITHOUT CONTRAST  Technique:  Multiplanar and multiecho pulse sequences of the cervical spine, to include the craniocervical junction and cervicothoracic junction, were obtained according to standard protocol without intravenous contrast.  Comparison: See report of MRI brain performed concurrently.  Findings: Mild reversal of the normal cervical lordotic curve. Disc space narrowing C6-C7.  Normal marrow signal.  Normal cord size and signal.  The individual disc spaces were examined as follows:  C2-3:  Normal.  C3-4:  Normal.  C4-5:  Mild bulge with left-sided uncinate spurring.  Slight effacement anterior subarachnoid space.  Left-sided foraminal narrowing affects the C5 nerve root.  C5-6:  Central and leftward protrusion.  Bilateral uncinate spurring.  Bilateral facet arthropathy.  Left greater than right C6 nerve root impingement.  Mild central canal stenosis.  C6-7:  Left-sided uncinate spurring and facet arthropathy narrows the foramen, likely affecting the C7 nerve root.  C7-T1:  Central protrusion.  No impingement.  IMPRESSION: Multilevel spondylosis as described.  There is no dominant right- sided compressive lesion.   Original Report Authenticated By: Davonna Belling, M.D.         Subjective: Patient feels that her right upper extremity or lower extremity weakness are somewhat better. Denies any fevers, chills, chest discomfort, shortness breath, nausea vomiting, diarrhea, abdominal pain, dysuria.  Objective: Filed Vitals:   02/15/13 0035 02/15/13 0246 02/15/13 0424 02/15/13 1030  BP: 118/47 152/64 171/54 176/88  Pulse: 58 60 56 88  Temp:  98.6 F (37 C) 98.6 F (37 C) 98.2 F (36.8 C) 97.7 F (36.5 C)  TempSrc: Oral Oral Oral Oral  Resp: 19 20 19    Height:      Weight:      SpO2: 95% 98% 96% 95%    Intake/Output Summary (Last 24 hours) at 02/15/13 1221 Last data filed at 02/15/13 0819  Gross per 24 hour  Intake    240 ml  Output      0 ml  Net    240 ml   Weight change:  Exam:   General:  Pt is alert, follows commands appropriately, not in acute distress  HEENT: No icterus, No thrush, [place]/AT  Cardiovascular: RRR, S1/S2, no rubs, no gallops  Respiratory: CTA bilaterally, no wheezing, no crackles, no rhonchi  Abdomen: Soft/+BS, non tender, non distended, no guarding  Extremities: No edema, No lymphangitis, No petechiae, No rashes, no synovitis  Data Reviewed: Basic Metabolic Panel:  Recent Labs Lab 02/14/13 1458 02/15/13 0440  NA 140 141  K 5.3* 4.1  CL 107 106  CO2  --  24  GLUCOSE 75 112*  BUN 14 12  CREATININE 0.90 0.79  CALCIUM  --  8.9   Liver Function Tests: No results found for this basename: AST, ALT, ALKPHOS, BILITOT, PROT, ALBUMIN,  in the last 168 hours No results found for this basename: LIPASE, AMYLASE,  in the last 168 hours No results found for this basename: AMMONIA,  in the last 168 hours CBC:  Recent Labs Lab 02/14/13 1407 02/14/13 1458  WBC 7.3  --   NEUTROABS 3.4  --   HGB 14.3 14.6  HCT 40.2 43.0  MCV 91.8  --   PLT 209  --  Cardiac Enzymes: No results found for this basename: CKTOTAL, CKMB, CKMBINDEX, TROPONINI,  in the last 168 hours BNP: No components found with this basename: POCBNP,  CBG:  Recent Labs Lab 02/14/13 1357 02/14/13 2217 02/15/13 0705 02/15/13 1143  GLUCAP 95 93 110* 102*    No results found for this or any previous visit (from the past 240 hour(s)).   Scheduled Meds: . carvedilol  6.25 mg Oral BID WC  . enoxaparin (LOVENOX) injection  40 mg Subcutaneous Q24H  . insulin aspart  0-5 Units Subcutaneous QHS  . insulin aspart  0-9  Units Subcutaneous TID WC  . olmesartan  40 mg Oral Daily  . pantoprazole  40 mg Oral Q1200  . ticlopidine  250 mg Oral BID WC   Continuous Infusions: . sodium chloride 75 mL/hr at 02/15/13 0831     Herschel Fleagle, DO  [facility] Pager [phone]  If 7PM-7AM, please contact night-coverage [email or web address] Password [email or web address] 02/15/2013, 12:21 PM   LOS: 1 day

## 2013-02-15 NOTE — Evaluation (Signed)
Speech Language Pathology Evaluation Patient Details Name: Margaret Vazquez MRN: [id number] DOB: [birth year] Today's Date: 02/15/2013 Time: 4132-4401 SLP Time Calculation (min): 19 min  Problem List:  Patient Active Problem List   Diagnosis Date Noted  . Numbness and tingling of right arm 02/14/2013  . Encounter for Medicare annual wellness exam 10/27/2012  . UTI (urinary tract infection) 10/20/2012  . Tick bite 09/29/2012  . Encopresis 12/15/2011  . Post-menopausal 10/28/2011  . Abnormal urine odor 07/30/2011  . Diarrhea 07/30/2011  . PERSONAL HX COLONIC POLYPS 09/04/2009  . VITAMIN B12 DEFICIENCY 03/06/2009  . UNSPECIFIED ANEMIA 02/13/2009  . ALLERGIC RHINITIS 02/14/2008  . BACK PAIN, LUMBAR 11/03/2007  . HYPERLIPIDEMIA 11/24/2006  . HYPERTENSION, ESSENTIAL NOS 11/24/2006  . DIABETES MELLITUS, TYPE II 10/09/2006  . GERD 10/09/2006  . OVERACTIVE BLADDER 10/09/2006  . INCONTINENCE, URGE 10/09/2006  . SKIN CANCER, HX OF 10/09/2006   Past Medical History:  Past Medical History  Diagnosis Date  . Hypertension   . Diabetes mellitus     type II  . Hyperlipidemia     myalgias with Lipitor and Zetia  . GERD (gastroesophageal reflux disease)   . Skin cancer     hx of basal cell/ak's  . Colon polyps 2009  . Allergy history, drug     Aspirin  . Cervical stenosis of spine     With neck pain  . CAD (coronary artery disease) 04/08/11    non obst by cath  . Migraine    Past Surgical History:  Past Surgical History  Procedure Laterality Date  . Appendectomy    . Tubal ligation      BTL  . Breast surgery  1992    breast biopsy  . Eye surgery  2005    tear duct surgery  . Nasal sinus surgery  01/2005  . Cardiac catheterization  1993    LHC: normal coronaries.   . Stress cardiolite  11/1999    Normal/ negative  . Cystoscopy w/ decannulation  03/2000    Normal  . Shoulder surgery  10/2002    Impingement  . Abd [place]  07/2003    Negative  . Tear duct surgery  2005  .  Colonoscopy  12/2007    Adenomatous colon polyps  . Cardiac catheterization  04/07/2011    non obst CAD (Dr Excell Seltzer)   HPI:  77 y.o. female admitted with CVA symptoms including right arm tingling and numbness and difficulty writing and holding her fork. She reports these symptoms have resolved. She denies difficulty with speech and swallowing.   Assessment / Plan / Recommendation Clinical Impression  Speech, language and cognition are [facility] and consistent with pt.'s baseline functioning. No speech therapy recommended at this time.    SLP Assessment  Patient does not need any further Speech Lanaguage Pathology Services    Follow Up Recommendations  None    Frequency and Duration        Pertinent Vitals/Pain No pain reported at this time   SLP Goals  SLP Goals Progress/Goals/Alternative treatment plan discussed with pt/caregiver and they: Agree  SLP Evaluation Prior Functioning  Cognitive/Linguistic Baseline: Within functional limits Type of Home: House  Lives With: Spouse Available Help at Discharge: Family Vocation: Retired   IT consultant  Overall Cognitive Status: Within Functional Limits for tasks assessed Arousal/Alertness: alert Orientation Level: Oriented X4 Attention: Divided Divided Attention: Appears intact Memory: Appears intact Awareness: Appears intact Problem Solving: Appears intact Executive Function: Reasoning;Organizing;Decision Making;Sequencing;Initiating Reasoning: Appears intact Sequencing: Appears intact  Organizing: Appears intact Decision Making: Appears intact Initiating: Appears intact Safety/Judgment: Appears intact    Comprehension  Auditory Comprehension Overall Auditory Comprehension: Appears within functional limits for tasks assessed Yes/No Questions: Not tested Commands: Within Functional Limits Conversation: Complex Reading Comprehension Reading Status: Within funtional limits    Expression Expression Primary Mode of Expression:  Verbal Verbal Expression Overall Verbal Expression: Appears within functional limits for tasks assessed Initiation: No impairment Level of Generative/Spontaneous Verbalization: Conversation Pragmatics: No impairment Written Expression Written Expression: Not tested   Oral / Motor Oral Motor/Sensory Function Overall Oral Motor/Sensory Function: Appears within functional limits for tasks assessed Labial ROM: Within Functional Limits Labial Symmetry: Within Functional Limits Lingual ROM: Within Functional Limits Lingual Symmetry: Within Functional Limits Facial Symmetry: Within Functional Limits Motor Speech Overall Motor Speech: Appears within functional limits for tasks assessed Phonation: Normal Intelligibility: Intelligible   GO Functional Assessment Tool Used: Non standardized assessment, clinical judgement   Talana Slatten, Radene Journey 02/15/2013, 10:05 AM

## 2013-02-15 NOTE — Progress Notes (Signed)
Stroke Team Progress Note  HISTORY Margaret Vazquez is a 77 y.o. female who over the past couple of days has had intermittent episodes of right arm numbness, tingling, dysfunction that has been transient up until this morning 02/14/2013 at which point she has been having persistent problems. Her husband states that she was having difficulty yesterday with eating with her fork. Today, she noticed that she was having difficulty writing and therefore sought evaluation in the emergency room  Patient was not a TPA candidate secondary to delay in arrival. She was admitted for further evaluation and treatment.  SUBJECTIVE Her husband is at the bedside.  Overall she feels her condition is stable.   OBJECTIVE Most recent Vital Signs: Filed Vitals:   02/14/13 2240 02/15/13 0035 02/15/13 0246 02/15/13 0424  BP: 201/68 118/47 152/64 171/54  Pulse: 70 58 60 56  Temp: 97.8 F (36.6 C) 98.6 F (37 C) 98.6 F (37 C) 98.2 F (36.8 C)  TempSrc: Oral Oral Oral Oral  Resp: 18 19 20 19   Height:      Weight:      SpO2: 97% 95% 98% 96%   CBG (last 3)   Recent Labs  02/14/13 1357 02/14/13 2217 02/15/13 0705  GLUCAP 95 93 110*    IV Fluid Intake:   . sodium chloride 75 mL/hr at 02/15/13 0831    MEDICATIONS  . carvedilol  6.25 mg Oral BID WC  . enoxaparin (LOVENOX) injection  40 mg Subcutaneous Q24H  . insulin aspart  0-5 Units Subcutaneous QHS  . insulin aspart  0-9 Units Subcutaneous TID WC  . olmesartan  40 mg Oral Daily  . pantoprazole  40 mg Oral Q1200  . ticlopidine  250 mg Oral BID WC   PRN:  acetaminophen, acetaminophen, senna-docusate  Diet:  Carb Control thin liquids Activity:  OOB Up with assistance DVT Prophylaxis:  Lovenox 40 mg sq daily   CLINICALLY SIGNIFICANT STUDIES Basic Metabolic Panel:  Recent Labs Lab 02/14/13 1458 02/15/13 0440  NA 140 141  K 5.3* 4.1  CL 107 106  CO2  --  24  GLUCOSE 75 112*  BUN 14 12  CREATININE 0.90 0.79  CALCIUM  --  8.9   Liver  Function Tests: No results found for this basename: AST, ALT, ALKPHOS, BILITOT, PROT, ALBUMIN,  in the last 168 hours CBC:  Recent Labs Lab 02/14/13 1407 02/14/13 1458  WBC 7.3  --   NEUTROABS 3.4  --   HGB 14.3 14.6  HCT 40.2 43.0  MCV 91.8  --   PLT 209  --    Coagulation:  Recent Labs Lab 02/14/13 1407  LABPROT 12.5  INR 0.95   Cardiac Enzymes: No results found for this basename: CKTOTAL, CKMB, CKMBINDEX, TROPONINI,  in the last 168 hours Urinalysis: No results found for this basename: COLORURINE, APPERANCEUR, LABSPEC, PHURINE, GLUCOSEU, HGBUR, BILIRUBINUR, KETONESUR, PROTEINUR, UROBILINOGEN, NITRITE, LEUKOCYTESUR,  in the last 168 hours Lipid Panel    Component Value Date/Time   CHOL 172 02/15/2013 0440   TRIG 161* 02/15/2013 0440   HDL 41 02/15/2013 0440   CHOLHDL 4.2 02/15/2013 0440   VLDL 32 02/15/2013 0440   LDLCALC 99 02/15/2013 0440   HgbA1C  Lab Results  Component Value Date   HGBA1C 6.4 10/20/2012    Urine Drug Screen:   No results found for this basename: labopia, cocainscrnur, labbenz, amphetmu, thcu, labbarb    Alcohol Level: No results found for this basename: ETH,  in the last 168  hours  Mr Cervical Spine 02/14/2013  Multilevel spondylosis as described.  There is no dominant right- sided compressive lesion.     CT of the brain  02/14/2013    1.  No acute intracranial process.  2.  Extensive sinus opacification disease as above with air fluid level within the left maxillary sinus, nonspecific but could be seen in the setting of acute sinusitis.   MRI of the brain  02/14/2013   Multifocal areas of acute infarction affect the left posterior frontal and parietal cortex and underlying white matter.  Mild atrophy and chronic microvascular ischemic change.  Pansinusitis, worse on the right.  MRA of the brain  02/14/2013    Severe proximal intracranial atherosclerotic change affecting the M1 segment left middle cerebral artery.   2D Echocardiogram    Carotid Doppler     CXR  02/14/2013    Hyperexpanded lungs without acute cardiopulmonary disease.   EKG  normal sinus rhythm.   Therapy Recommendations   Physical Exam   Pleasant middle aged Caucasian lady not in distress.Awake alert. Afebrile. Head is nontraumatic. Neck is supple without bruit. Hearing is normal. Cardiac exam no murmur or gallop. Lungs are clear to auscultation. Distal pulses are well felt. Neurological Exam : Awake alert oriented x3 normal speech and language function. Extraocular movements are full range without nystagmus. Fundi were not visualized. Vision acuity and fields appear normal. Face is symmetric without weakness. Tongue is midline. Motor system exam reveals no upper or lower eczema to drift but mild weakness of the right grip and intrinsic hand muscles as well as right hip flexors and ankle dorsiflexors. Orbits left-to-right upper extremity. Sensation appears preserved. Gait was not tested. ASSESSMENT Ms. Margaret Vazquez is a 77 y.o. female presenting with episodes of right arm numbness, tingling, dysfunction. Imaging confirms a left posterior frontal and parietal cortex and underlying white matter infarcts. Infarcts felt to be either due to intrinsic narrowing from thrombotic source or embolic secondary to unknown etiology (? afib).  On no antithrombotics due to aspirin and plavix allergies prior to admission. Now on ticlid alone for secondary stroke prevention (insurance company was not agreeable to pay for ticlid in the past). Patient with resultant mild right hemipareis. Work up underway.  Hypertension Hyperlipidemia, LDL 99, on no statin PTA, now on no statin, at goal LDL < 100 (from stroke standpoint) Diabetes, HgbA1c 6.4, goal < 7.0 CAD, non obstr per cath Hx migraine  Hospital day # 1  TREATMENT/PLAN  Continue  ticlid for secondary stroke prevention. Will need a letter to insurance company requesting payment of drug due to multiple antiplatelet allergies as they have  refused payment in the past.  Due to antiplatelet allergies, no need to look at intracranial vasculature/stenting as pt could not take post procedure asa/plavix TEE to look for embolic source. Arranged with [facility] for tomorrow. I have made NPO. If positive for PFO (patent foramen ovale), check bilateral lower extremity venous dopplers to rule out DVT as possible source of stroke. If TEE negative, [place] cardiologist will place implantable loop recorder to evaluate for atrial fibrillation as etiology of stroke. This has been explained to patient/family by Dr. Pearlean Brownie and they are agreeable. Dr. Graciela Husbands will see in am.  Dr. Pearlean Brownie discussed diagnosis, prognosis,  treatment options and plan of care with pt, husband and Dr. Arbutus Leas.    Annie Main, MSN, RN, ANVP-BC, ANP-BC, [facility] Pager: [phone] 02/15/2013 10:42 AM  I have personally obtained a history,  examined the patient, evaluated imaging results, and formulated the assessment and plan of care. I agree with the above. Antony Contras, MD

## 2013-02-16 ENCOUNTER — Encounter (HOSPITAL_COMMUNITY)
Admission: EM | Disposition: A | Discharge status: Home / Self Care | Payer: Self-pay | Source: Home / Self Care | Attending: Internal Medicine

## 2013-02-16 ENCOUNTER — Encounter (HOSPITAL_COMMUNITY): Payer: Self-pay

## 2013-02-16 DIAGNOSIS — D649 Anemia, unspecified: Secondary | ICD-10-CM

## 2013-02-16 DIAGNOSIS — I6350 Cerebral infarction due to unspecified occlusion or stenosis of unspecified cerebral artery: Secondary | ICD-10-CM

## 2013-02-16 DIAGNOSIS — I6789 Other cerebrovascular disease: Secondary | ICD-10-CM

## 2013-02-16 HISTORY — PX: LOOP RECORDER IMPLANT: SHX5477

## 2013-02-16 HISTORY — PX: TEE WITHOUT CARDIOVERSION: SHX5443

## 2013-02-16 LAB — GLUCOSE, CAPILLARY
Glucose-Capillary: 120 mg/dL — ABNORMAL HIGH (ref 70–99)
Glucose-Capillary: 121 mg/dL — ABNORMAL HIGH (ref 70–99)
Glucose-Capillary: 114 mg/dL — ABNORMAL HIGH (ref 70–99)

## 2013-02-16 SURGERY — ECHOCARDIOGRAM, TRANSESOPHAGEAL
Anesthesia: Moderate Sedation

## 2013-02-16 SURGERY — LOOP RECORDER IMPLANT
Anesthesia: LOCAL

## 2013-02-16 MED ORDER — FENTANYL CITRATE 0.05 MG/ML IJ SOLN
INTRAMUSCULAR | Status: DC | PRN
  Administered 2013-02-16: 25 ug via INTRAVENOUS

## 2013-02-16 MED ORDER — MIDAZOLAM HCL 10 MG/2ML IJ SOLN
INTRAMUSCULAR | Status: DC | PRN
  Administered 2013-02-16: 2 mg via INTRAVENOUS
  Administered 2013-02-16: 1 mg via INTRAVENOUS
  Administered 2013-02-16: 2 mg via INTRAVENOUS

## 2013-02-16 MED ORDER — FENTANYL CITRATE 0.05 MG/ML IJ SOLN
INTRAMUSCULAR | Status: AC
  Filled 2013-02-16: qty 2

## 2013-02-16 MED ORDER — SODIUM CHLORIDE 0.9 % IV SOLN: INTRAVENOUS | Status: DC

## 2013-02-16 MED ORDER — TICLOPIDINE HCL 250 MG PO TABS: 250.0000 mg | ORAL_TABLET | Freq: Two times a day (BID) | ORAL | Status: DC

## 2013-02-16 MED ORDER — CHLORHEXIDINE GLUCONATE 4 % EX LIQD
60.0000 mL | Freq: Once | CUTANEOUS | Status: AC
  Administered 2013-02-16: 4 via TOPICAL
  Filled 2013-02-16: qty 60

## 2013-02-16 MED ORDER — MIDAZOLAM HCL 5 MG/ML IJ SOLN
INTRAMUSCULAR | Status: AC
  Filled 2013-02-16: qty 2

## 2013-02-16 MED ORDER — FLUMAZENIL 0.5 MG/5ML IV SOLN
INTRAVENOUS | Status: DC | PRN
  Administered 2013-02-16: .5 mg via INTRAVENOUS

## 2013-02-16 MED ORDER — VANCOMYCIN HCL IN DEXTROSE 1-5 GM/200ML-% IV SOLN: 1000.0000 mg | INTRAVENOUS | Status: DC

## 2013-02-16 MED ORDER — FLUMAZENIL 0.5 MG/5ML IV SOLN
INTRAVENOUS | Status: AC
  Filled 2013-02-16: qty 5

## 2013-02-16 MED ORDER — BUTAMBEN-TETRACAINE-BENZOCAINE 2-2-14 % EX AERO
INHALATION_SPRAY | CUTANEOUS | Status: DC | PRN
  Administered 2013-02-16: 2 via TOPICAL

## 2013-02-16 NOTE — CV Procedure (Signed)
TEE:  4 mg versed and 25 ug of fentanyl  Normal EF 65% LVH Normal atria Normal MV Normal AV No ASD and negative bubble study Normal RV Normal Aorta with no disection or debris No LAA thormbus  No SOE  Charlton Haws

## 2013-02-16 NOTE — ED Provider Notes (Signed)
I saw and evaluated the patient, reviewed the resident's note and I agree with the findings and plan. Patient with pmh htn, dm, hyperlipidemia.  Presents with complains of weakness and numbness of her right arm and leg for the past two days.  It is worsening.  She denies any injury or trauma.  No headache, fever, or chills.  She denies any chest pain or shortness of breath.  No visual complaints.  On exam, the vitals are stable and the patient is afebrile.  She is awake, alert, oriented, and appropriate.  The heart is regular rate and rhythm without murmurs.  The lungs are clear.  The extremities are without edema.  The abdomen is soft, non-tender.  Neurologically, strength is 5/5 in the left arm and leg a 4/5 in the right arm and leg.  Finger to nose and heel to shin are within normal limits.  The patient presents here with a worsening right sided weakness for the past two days.  She is out of the range for thrombolysis, therefore no code stroke was initiated.  Head ct and labs returned essentially unremarkable.  In consultation with the neurologist, the patient will require an mri of the brain and cervical spine and admission to the medicine service.  The mri has been ordered and she will be admitted to triad.     Date: 02/16/2013  Rate: 60  Rhythm: normal sinus rhythm  QRS Axis: normal  Intervals: normal  ST/T Wave abnormalities: normal  Conduction Disutrbances:none  Narrative Interpretation:   Old EKG Reviewed: none available    Geoffery Lyons, MD 02/16/13 [phone]

## 2013-02-16 NOTE — Progress Notes (Signed)
OT Cancellation Note  Patient Details Name: KANIJA REMMEL MRN: [id number] DOB: 03/10/1936   Cancelled Treatment:    Reason Eval/Treat Not Completed: Patient at procedure or test/ unavailable.  (LOOP RECORDER IMPLANT )   Lucile Shutters Pager: [phone]  02/16/2013, 4:21 PM

## 2013-02-16 NOTE — Interval H&P Note (Signed)
History and Physical Interval Note:  02/16/2013 10:38 AM  Margaret Vazquez  has presented today for surgery, with the diagnosis of STROKE  The various methods of treatment have been discussed with the patient and family. After consideration of risks, benefits and other options for treatment, the patient has consented to  Procedure(s): TRANSESOPHAGEAL ECHOCARDIOGRAM (TEE) (N/A) as a surgical intervention .  The patient's history has been reviewed, patient examined, no change in status, stable for surgery.  I have reviewed the patient's chart and labs.  Questions were answered to the patient's satisfaction.     Charlton Haws

## 2013-02-16 NOTE — Progress Notes (Signed)
Physical Therapy Treatment Patient Details Name: Margaret Vazquez MRN: [id number] DOB: Jun 21, 1936 Today's Date: 02/16/2013 Time: [id number] PT Time Calculation (min): 23 min  PT Assessment / Plan / Recommendation  History of Present Illness Patient is a 77 y/o female admitted with right UE numbness, tingling and dysfunction.  MRI positive for mutifocal infarcts left post MCA division affecting the left posterior frontal and parietal cortex and underlying white matter   PT Comments   Pt very pleasant & willing to participate in therapy.  Independence limited by balance deficits & generalized weakness.  Pt performed Berg Balance assessment this session with a score of 38.  Cont to recommend HHPT & 24 hr assist/supervision at d/c.     Follow Up Recommendations  Home health PT;Supervision/Assistance - 24 hour     Does the patient have the potential to tolerate intense rehabilitation     Barriers to Discharge        Equipment Recommendations  Cane;Rolling walker with 5" wheels;Other (comment)    Recommendations for Other Services    Frequency Min 4X/week   Progress towards PT Goals    Plan Current plan remains appropriate    Precautions / Restrictions Precautions Precautions: Fall Restrictions Weight Bearing Restrictions: No   Pertinent Vitals/Pain No pain reported    Mobility  Bed Mobility Bed Mobility: Supine to Sit;Sitting - Scoot to Edge of Bed;Sit to Supine Supine to Sit: 6: Modified independent (Device/Increase time);HOB flat Sitting - Scoot to Edge of Bed: 6: Modified independent (Device/Increase time) Sit to Supine: 6: Modified independent (Device/Increase time);HOB flat Transfers Transfers: Sit to Stand;Stand to Sit Sit to Stand: 5: Supervision;With upper extremity assist;From bed;From toilet Stand to Sit: 5: Supervision;With upper extremity assist;To bed;To toilet Details for Transfer Assistance: supervision for safety.  Cues to ensure balance before taking  step Ambulation/Gait Ambulation/Gait Assistance: 4: Min guard Ambulation Distance (Feet): 300 Feet Assistive device: None Ambulation/Gait Assistance Details: guarding for safety due to pt with mild staggering ocassionally but no overt LOB.  Pt reports LE's feelnig weak Gait Pattern: Step-through pattern;Decreased stride length;Narrow base of support Stairs: Yes Stairs Assistance: 4: Min guard Stairs Assistance Details (indicate cue type and reason): guarding for safety.  Pt up/down steps without use of rail.  encouraged rail to ensure safety Stair Management Technique: No rails;Alternating pattern;Forwards Number of Stairs: 5 (2x's) Wheelchair Mobility Wheelchair Mobility: No Modified Rankin (Stroke Patients Only) Pre-Morbid Rankin Score: No symptoms Modified Rankin: Moderately severe disability      PT Goals (current goals can now be found in the care plan section) Acute Rehab PT Goals PT Goal Formulation: With patient Time For Goal Achievement: 02/22/13 Potential to Achieve Goals: Good  Visit Information  Last PT Received On: 02/16/13 Assistance Needed: +1 History of Present Illness: Patient is a 77 y/o female admitted with right UE numbness, tingling and dysfunction.  MRI positive for mutifocal infarcts left post MCA division affecting the left posterior frontal and parietal cortex and underlying white matter    Subjective Data      Cognition  Cognition Arousal/Alertness: Awake/alert Behavior During Therapy: WFL for tasks assessed/performed Overall Cognitive Status: Within Functional Limits for tasks assessed    Balance  Standardized Balance Assessment Standardized Balance Assessment: Berg Balance Test Berg Balance Test Sit to Stand: Able to stand  independently using hands Standing Unsupported: Able to stand safely 2 minutes Sitting with Back Unsupported but Feet Supported on Floor or Stool: Able to sit safely and securely 2 minutes Stand to Sit: Controls descent  by  using hands Transfers: Able to transfer safely, minor use of hands Standing Unsupported with Eyes Closed: Able to stand 3 seconds (falls posteriorly) Standing Ubsupported with Feet Together: Able to place feet together independently and stand for 1 minute with supervision From Standing, Reach Forward with Outstretched Arm: Can reach forward >5 cm safely (2") From Standing Position, Pick up Object from Floor: Able to pick up shoe, needs supervision From Standing Position, Turn to Look Behind Over each Shoulder: Looks behind from both sides and weight shifts well Turn 360 Degrees: Able to turn 360 degrees safely in 4 seconds or less Standing Unsupported, Alternately Place Feet on Step/Stool: Able to complete >2 steps/needs minimal assist Standing Unsupported, One Foot in Front: Needs help to step but can hold 15 seconds Standing on One Leg: Unable to try or needs assist to prevent fall Total Score: 38  End of Session PT - End of Session Equipment Utilized During Treatment: Gait belt Activity Tolerance: Patient tolerated treatment well Patient left: in bed;with call bell/phone within reach Nurse Communication: Mobility status     Sarajane Marek, [place] [phone] 02/16/2013

## 2013-02-16 NOTE — Discharge Instructions (Signed)
STROKE/TIA DISCHARGE INSTRUCTIONS SMOKING Cigarette smoking nearly doubles your risk of having a stroke & is the single most alterable risk factor  If you smoke or have smoked in the last 12 months, you are advised to quit smoking for your health.  Most of the excess cardiovascular risk related to smoking disappears within a year of stopping.  Ask you doctor about anti-smoking medications  [place] Quit Line: 1-800-QUIT NOW  Free Smoking Cessation Classes [phone])  CHOLESTEROL Know your levels; limit fat & cholesterol in your diet  Lipid Panel     Component Value Date/Time   CHOL 172 02/15/2013 0440   TRIG 161* 02/15/2013 0440   HDL 41 02/15/2013 0440   CHOLHDL 4.2 02/15/2013 0440   VLDL 32 02/15/2013 0440   LDLCALC 99 02/15/2013 0440      Many patients benefit from treatment even if their cholesterol is at goal.  Goal: Total Cholesterol (CHOL) less than 160  Goal:  Triglycerides (TRIG) less than 150  Goal:  HDL greater than 40  Goal:  LDL (LDLCALC) less than 100   BLOOD PRESSURE American Stroke Association blood pressure target is less that 120/80 mm/Hg  Your discharge blood pressure is:  BP: 171/54 mmHg  Monitor your blood pressure  Limit your salt and alcohol intake  Many individuals will require more than one medication for high blood pressure  DIABETES (A1c is a blood sugar average for last 3 months) Goal HGBA1c is under 7% (HBGA1c is blood sugar average for last 3 months)  Diabetes: {STROKE DC DIABETES:22357}    Lab Results  Component Value Date   HGBA1C 6.4 10/20/2012     Your HGBA1c can be lowered with medications, healthy diet, and exercise.  Check your blood sugar as directed by your physician  Call your physician if you experience unexplained or low blood sugars.  PHYSICAL ACTIVITY/REHABILITATION Goal is 30 minutes at least 4 days per week    {STROKE DC ACTIVITY/REHAB:22359}  Activity decreases your risk of heart attack and stroke and makes your heart  stronger.  It helps control your weight and blood pressure; helps you relax and can improve your mood.  Participate in a regular exercise program.  Talk with your doctor about the best form of exercise for you (dancing, walking, swimming, cycling).  DIET/WEIGHT Goal is to maintain a healthy weight  Your discharge diet is: Carb Control *** liquids Your height is:  Height: '5\' 9"'$  (175.3 cm) Your current weight is: Weight: 95.437 kg (210 lb 6.4 oz) Your Body Mass Index (BMI) is:  BMI (Calculated): 31.1  Following the type of diet specifically designed for you will help prevent another stroke.  Your goal weight range is:  ***  Your goal Body Mass Index (BMI) is 19-24.  Healthy food habits can help reduce 3 risk factors for stroke:  High cholesterol, hypertension, and excess weight.  RESOURCES Stroke/Support Group:  Call [phone]  they meet the 3rd Sunday of the month on the Rehab Unit at [facility], [place] ( no meetings June, July & Aug).  STROKE EDUCATION PROVIDED/REVIEWED AND GIVEN TO PATIENT Stroke warning signs and symptoms How to activate emergency medical system (call 911). Medications prescribed at discharge. Need for follow-up after discharge. Personal risk factors for stroke. Pneumonia vaccine given:   {STROKE DC YES/NO/DATE:22363} Flu vaccine given:   {STROKE DC YES/NO/DATE:22363} My questions have been answered, the writing is legible, and I understand these instructions.  I will adhere to these goals & educational materials that have been  provided to me after my discharge from the hospital.

## 2013-02-16 NOTE — Discharge Summary (Signed)
Physician Discharge Summary  Patient ID: Margaret Vazquez MRN: [id number] DOB/AGE: Sep 04, 1935 77 y.o.  Admit date: 02/14/2013 Discharge date: 02/16/2013  Primary Care Physician:  Roxy Manns, MD  Discharge Diagnoses:    Multifocal areas of acute infarction affect the left posterior frontal and parietal cortex  . DIABETES MELLITUS, TYPE II . HYPERTENSION, ESSENTIAL NOS . GERD . HYPERLIPIDEMIA . (Resolved) Numbness and tingling of right arm and leg . Numbness and tingling of right arm  Consults: Neurology                [facility]      Recommendations for Outpatient Follow-up:  - patient has to continue Ticlid due to multiple antiplatelet allergies.   Allergies:   Allergies  Allergen Reactions  . Bee Venom Hives, Shortness Of Breath and Swelling  . Nabumetone Anaphylaxis  . Amoxicillin-Pot Clavulanate Hives and Swelling    To lips.  . Aspirin Hives  . Atorvastatin Swelling     joint pain/swelling, inc liver tests  . Clopidogrel Bisulfate Hives  . Codeine Nausea And Vomiting  . Ezetimibe Other (See Comments)     fatigue  . Metformin And Related Other (See Comments)    Diarrhea   . Valsartan Other (See Comments)     fatigue     Discharge Medications:   Medication List         carvedilol 6.25 MG tablet  Commonly known as:  COREG  Take 1 tablet (6.25 mg total) by mouth 2 (two) times daily with a meal.     cholestyramine 4 G packet  Commonly known as:  QUESTRAN  1 packet as needed. diarrhea     EPINEPHrine 0.3 mg/0.3 mL Devi  Commonly known as:  EPI-PEN  Inject 0.3 mg into the muscle once.     glipiZIDE 5 MG 24 hr tablet  Commonly known as:  GLUCOTROL XL  Take 1 tablet (5 mg total) by mouth daily.     olmesartan 40 MG tablet  Commonly known as:  BENICAR  Take 1 tablet (40 mg total) by mouth daily.     ticlopidine 250 MG tablet  Commonly known as:  TICLID  Take 1 tablet (250 mg total) by mouth 2 (two) times daily with a meal.         Brief  H and P: For complete details please refer to admission H and P, but in briefElsie ALINNA Vazquez is a 77 y.o. female with past medical history significant for hypertension, diabetes mellitus and hyperlipidemia who presents with above complaints. She reported she's had numbness and tingling in her right upper extremity intermittently since Friday , 3 days prior to admission. She also reported that that right arm felt lifeless/weak. She denied headaches, slurred speech, blurry vision, dysphagia.  on the day of admission, her symptoms were worsening and she called her PCP and then decided to come to ED. She was seen in the ED and he CT scan of her brain showed no acute intracranial process, extensive sinus opacification with air-fluid level within the left maxillary sinus-possible acute sinusitis was noted. Neuro was consulted per EDP and she was admitted for further evaluation and management   Hospital Course:   Acute nonhemorrhagic stroke likely secondary to thrombotic source or embolic ? Atrial fibrillation  -MRI brain showed multifocal acute infarct left posterior frontal and parietal lobes  -MRI brain showed severe diffuse disease left MCA, M1 segment  -TEE done today 8/13 showed EF 65%, LVH, no ASD and a negative  bubble study, No LAA thrombus  -Loop recorder was implanted today on 02/16/2013, she'll have loop recorder check on August 25 at the pacemaker clinic with [facility]. Continue Ticlid (allergy to ASA and plavix)--due to insurance issues, the patient has been off this medication for about 1year, Dr Pearlean Brownie to write a letter to the insurance company, I consfirmed this with [name] (Neuro NP).  Carotid Doppler showed bilateral 1-39% ICA stenosis.  2D Echocardiogram showed EF 65-70% with no source of embolus.  -LDL 99, goal LDL less than 100 from stroke standpoint, not on statin   Diabetes mellitus type 2 : Patient was continued on sliding scale and while inpatient, she was on glipizide  preadmission.   -hemoglobin A1c 6.5   Hypertension  -Continue Benicar   nonobstructive coronary artery disease  -Continue carvedilol   Hyperlipidemia  -Patient has numerous intolerances to statins, Continue Questran   Day of Discharge BP 151/61  Pulse 53  Temp(Src) 97.5 F (36.4 C) (Oral)  Resp 18  Ht 5\' 9"  (1.753 m)  Wt 95.437 kg (210 lb 6.4 oz)  BMI 31.06 kg/m2  SpO2 99%  Physical Exam:  General: Alert and awake, oriented x3, NAD.  CVS: S1-S2 clear, no murmur rubs or gallops  Chest: clear to auscultation bilaterally, no wheezing, rales or rhonchi  Abdomen: soft nontender, nondistended, normal bowel sounds  Extremities: no cyanosis, clubbing or edema noted bilaterally      The results of significant diagnostics from this hospitalization (including imaging, microbiology, ancillary and laboratory) are listed below for reference.    LAB RESULTS: Basic Metabolic Panel:  Recent Labs Lab 02/14/13 1458 02/15/13 0440  NA 140 141  K 5.3* 4.1  CL 107 106  CO2  --  24  GLUCOSE 75 112*  BUN 14 12  CREATININE 0.90 0.79  CALCIUM  --  8.9   Liver Function Tests: No results found for this basename: AST, ALT, ALKPHOS, BILITOT, PROT, ALBUMIN,  in the last 168 hours No results found for this basename: LIPASE, AMYLASE,  in the last 168 hours No results found for this basename: AMMONIA,  in the last 168 hours CBC:  Recent Labs Lab 02/14/13 1407 02/14/13 1458  WBC 7.3  --   NEUTROABS 3.4  --   HGB 14.3 14.6  HCT 40.2 43.0  MCV 91.8  --   PLT 209  --    CBG:  Recent Labs Lab 02/16/13 0637 02/16/13 1308  GLUCAP 120* 121*    Significant Diagnostic Studies:  Dg Chest 2 View  02/14/2013   *RADIOLOGY REPORT*  Clinical Data: Stroke, right-sided weakness  CHEST - 2 VIEW  Comparison: 08/05/2012; 04/07/2011; chest CT - 04/07/2011  Findings:  Grossly unchanged cardiac silhouette and mediastinal contours.  The lungs are hyperexpanded with flattening of bilateral  hemidiaphragms mild diffuse thickening of the pulmonary interstitium.  Unchanged minimal left basilar linear opacities favored to represent atelectasis.  No new focal airspace opacity.  No pleural effusion or pneumothorax.  No evidence of edema.  Unchanged bones.  IMPRESSION: Hyperexpanded lungs without acute cardiopulmonary disease.   Original Report Authenticated By: Tacey Ruiz, MD   Ct Head (brain) Wo Contrast  02/14/2013   *RADIOLOGY REPORT*  Clinical Data: Right-sided weakness, history of stroke  CT HEAD WITHOUT CONTRAST  Technique:  Contiguous axial images were obtained from the base of the skull through the vertex without contrast.  Comparison: 08/22/2011; 04/09/2011  Findings:  Wallace Cullens white differentiation is maintained.  No CT evidence of acute  large territory infarct.  No intraparenchymal or extra-axial mass or hemorrhage.  Normal size and configuration of the ventricles and basilar cisterns.  No midline shift.  There is near complete opacification of the right maxillary and frontal sinuses. There is scattered opacification of the right ethmoidal air cells.  An air fluid level is noted within the left maxillary sinus. There is normal aeration of the bilateral mastoid air cells.  Regional soft tissues are normal. Mild hyperostosis frontalis.   No displaced calvarial fracture.  IMPRESSION: 1.  No acute intracranial process.  2.  Extensive sinus opacification disease as above with air fluid level within the left maxillary sinus, nonspecific but could be seen in the setting of acute sinusitis.   Original Report Authenticated By: Tacey Ruiz, MD   Mr [facility] Wo Contrast  02/14/2013   *RADIOLOGY REPORT*  Clinical Data:  Right hand weakness and numbness beginning yesterday. History of diabetes mellitus.  History of hypertension.  MRI HEAD WITHOUT CONTRAST MRA HEAD WITHOUT CONTRAST  Technique:  Multiplanar, multiecho pulse sequences of the brain and surrounding structures were obtained without intravenous  contrast. Angiographic images of the head were obtained using MRA technique without contrast.  Comparison:  CT head 02/14/2013.  MRI/MRA head 08/10/2009.  MRI HEAD  Findings:  Multifocal subcentimeter areas of acute infarction affect the left posterior frontal and parietal cortex and underlying white matter. These are oriented in a radial fashion between the left MCA and PCA territories, suggesting a watershed type insult.  There is no associated hemorrhage or mass effect.  No intracranial mass lesion or hydrocephalus is seen.  Mild atrophy is present.  There is mild chronic microvascular ischemic change in the periventricular and subcortical white matter.  Calvarium intact.  No skull base or upper cervical lesions. Flow voids are maintained in the major intracranial vascular structures.  No pituitary abnormality or cerebellar tonsillar herniation.  Moderate sinus disease affects the maxillary and ethmoid regions, greater on the right. Air-fluid level left maxillary sinus.  There is also significant fluid accumulation in the right frontal sinus. Mucosal thickening noted in the sphenoid sinus.  Negative orbits.  Compared with prior CT, the infarcts are not visible.  Compared with prior MR, the severe disease of the left middle cerebral artery has progressed.  IMPRESSION: Multifocal areas of acute infarction affect the left posterior frontal and parietal cortex and underlying white matter.  Mild atrophy and chronic microvascular ischemic change.  Pansinusitis, worse on the right.  MRA HEAD  Findings: Widely patent internal carotid arteries.  Widely patent basilar artery with left vertebral dominant.  Right vertebral ends in PICA.  No proximal stenosis of the anterior cerebral arteries, posterior cerebral arteries, or right middle cerebral artery.  Severe diffuse disease of the left middle cerebral artery M1 segment with multiple tandem stenoses estimated to be 75-90% stenosis. Diminished flow related enhancement of  the left MCA trifurcation vessels.  No intracranial aneurysm is demonstrated.  IMPRESSION: Severe proximal intracranial atherosclerotic change affecting the M1 segment left middle cerebral artery.   Original Report Authenticated By: Davonna Belling, M.D.   Mr Brain Wo Contrast  02/14/2013   *RADIOLOGY REPORT*  Clinical Data:  Right hand weakness and numbness beginning yesterday. History of diabetes mellitus.  History of hypertension.  MRI HEAD WITHOUT CONTRAST MRA HEAD WITHOUT CONTRAST  Technique:  Multiplanar, multiecho pulse sequences of the brain and surrounding structures were obtained without intravenous contrast. Angiographic images of the head were obtained using MRA technique without contrast.  Comparison:  CT head 02/14/2013.  MRI/MRA head 08/10/2009.  MRI HEAD  Findings:  Multifocal subcentimeter areas of acute infarction affect the left posterior frontal and parietal cortex and underlying white matter. These are oriented in a radial fashion between the left MCA and PCA territories, suggesting a watershed type insult.  There is no associated hemorrhage or mass effect.  No intracranial mass lesion or hydrocephalus is seen.  Mild atrophy is present.  There is mild chronic microvascular ischemic change in the periventricular and subcortical white matter.  Calvarium intact.  No skull base or upper cervical lesions. Flow voids are maintained in the major intracranial vascular structures.  No pituitary abnormality or cerebellar tonsillar herniation.  Moderate sinus disease affects the maxillary and ethmoid regions, greater on the right. Air-fluid level left maxillary sinus.  There is also significant fluid accumulation in the right frontal sinus. Mucosal thickening noted in the sphenoid sinus.  Negative orbits.  Compared with prior CT, the infarcts are not visible.  Compared with prior MR, the severe disease of the left middle cerebral artery has progressed.  IMPRESSION: Multifocal areas of acute infarction  affect the left posterior frontal and parietal cortex and underlying white matter.  Mild atrophy and chronic microvascular ischemic change.  Pansinusitis, worse on the right.  MRA HEAD  Findings: Widely patent internal carotid arteries.  Widely patent basilar artery with left vertebral dominant.  Right vertebral ends in PICA.  No proximal stenosis of the anterior cerebral arteries, posterior cerebral arteries, or right middle cerebral artery.  Severe diffuse disease of the left middle cerebral artery M1 segment with multiple tandem stenoses estimated to be 75-90% stenosis. Diminished flow related enhancement of the left MCA trifurcation vessels.  No intracranial aneurysm is demonstrated.  IMPRESSION: Severe proximal intracranial atherosclerotic change affecting the M1 segment left middle cerebral artery.   Original Report Authenticated By: Davonna Belling, M.D.   Mr Cervical Spine Wo Contrast  02/14/2013   *RADIOLOGY REPORT*  Clinical Data: Right hand weakness and numbness.  MRI CERVICAL SPINE WITHOUT CONTRAST  Technique:  Multiplanar and multiecho pulse sequences of the cervical spine, to include the craniocervical junction and cervicothoracic junction, were obtained according to standard protocol without intravenous contrast.  Comparison: See report of MRI brain performed concurrently.  Findings: Mild reversal of the normal cervical lordotic curve. Disc space narrowing C6-C7.  Normal marrow signal.  Normal cord size and signal.  The individual disc spaces were examined as follows:  C2-3:  Normal.  C3-4:  Normal.  C4-5:  Mild bulge with left-sided uncinate spurring.  Slight effacement anterior subarachnoid space.  Left-sided foraminal narrowing affects the C5 nerve root.  C5-6:  Central and leftward protrusion.  Bilateral uncinate spurring.  Bilateral facet arthropathy.  Left greater than right C6 nerve root impingement.  Mild central canal stenosis.  C6-7:  Left-sided uncinate spurring and facet arthropathy narrows  the foramen, likely affecting the C7 nerve root.  C7-T1:  Central protrusion.  No impingement.  IMPRESSION: Multilevel spondylosis as described.  There is no dominant right- sided compressive lesion.   Original Report Authenticated By: Davonna Belling, M.D.    2D ECHO: Study Conclusions  - Left ventricle: The cavity size was normal. Wall thickness was increased in a pattern of moderate LVH. Systolic function was vigorous. The estimated ejection fraction was in the range of 65% to 70%. Wall motion was normal; there were no regional wall motion abnormalities. Doppler parameters are consistent with abnormal left ventricular relaxation (grade 1 diastolic dysfunction). -  Left atrium: The atrium was mildly dilated. Impressions:  - No cardiac source of emboli was indentified.   TEE: Normal EF 65% LVH Normal atria  Normal MV  Normal AV  No ASD and negative bubble study  Normal RV  Normal Aorta with no disection or debris  No LAA thormbus  No SOE     Disposition and Follow-up:     Discharge Orders   Future Appointments Provider Department Dept Phone   02/28/2013 2:30 PM [facility] [employer] [employer] [facility] Echo) [phone]   05/09/2013 8:45 AM [facility] [place] [phone]   05/16/2013 11:15 AM Judy Pimple, MD [facility] [place] [phone]   Future Orders Complete By Expires   Diet Carb Modified  As directed    Increase activity slowly  As directed        DISPOSITION:  home   DIET: Carb modified diet    DISCHARGE FOLLOW-UP Follow-up Information   Follow up with Roxy Manns, MD. Schedule an appointment as soon as possible for a visit in 10 days.   Specialties:  Family Medicine, Radiology   Contact information:   [address] [place] [address] [place] [place] [address] [phone]       Follow up with Gates Rigg, MD. Schedule an appointment as soon as possible for a visit in 2  months. (stroke follow-up)    Specialties:  Neurology, Radiology   Contact information:   [address] [place] [address] [phone]       Follow up with Sherryl Manges, MD On 02/28/2013. (at 2:30pm for loop recorder check)    Specialty:  Cardiology   Contact information:   [address] [place] [address] [phone]       Time spent on Discharge: 40 mins  Signed:   Jarett Dralle M.D. [facility] 02/16/2013, 4:31 PM Pager: [phone]

## 2013-02-16 NOTE — CV Procedure (Signed)
Pre op Dx  Cryptogenic stroke Post op Dx    Procedure  Loop Recorder implantation  After routine prep and drape of the left parasternal area, a small incision was created. A [employer] LINQ Reveal Loop Recorder  Serial Number  [id number] was inserted.    Steri-Strips were applied.  The patient tolerated the procedure without apparent complication.

## 2013-02-16 NOTE — Care Management Note (Unsigned)
    Page 1 of 1   02/16/2013     4:19:26 PM   CARE MANAGEMENT NOTE 02/16/2013  Patient:  Margaret Vazquez, Margaret Vazquez   Account Number:  [id number]  Date Initiated:  02/15/2013  Documentation initiated by:  Jiles Crocker  Subjective/Objective Assessment:   ADMITTED WITH STROKE     Action/Plan:   PCP: Roxy Manns, MD  LIVES A THOME WITH SPOUSE; CM FOLLOWING FOR DCP   Anticipated DC Date:  02/22/2013   Anticipated DC Plan:  HOME W HOME HEALTH SERVICES      DC Planning Services  CM consult      Choice offered to / List presented to:             Status of service:  Completed, signed off Medicare Important Message given?  NA - LOS <3 / Initial given by admissions (If response is "NO", the following Medicare IM given date fields will be blank) Date Medicare IM given:   Date Additional Medicare IM given:    Discharge Disposition:    Per UR Regulation:  Reviewed for med. necessity/level of care/duration of stay  If discussed at Long Length of Stay Meetings, dates discussed:    Comments:  02/16/13 1610 Elmer Bales RN, MSN, CM- Met with patient and daughter regarding home health. Pt and daughter are both declining home health therapies at this time. RN notified.  02/16/13 1500 Elmer Bales RN, MSN, CM- Spoke with Dr Isidoro Donning to inform her that Ticlid is not covered by patient's insurance.  Per Dr Isidoro Donning, stroke team will be writing a letter to insurance requesting approval for coverage.   02/15/13 1130 Elmer Bales RN, MSN, CM- Awaiting benefits check for Ticlid, verified with CMA that benefits check was recieved.   02/15/2013- B CHANDLER RN,BSN,MHA

## 2013-02-16 NOTE — Consult Note (Signed)
ELECTROPHYSIOLOGY CONSULT NOTE  Patient ID: Margaret Vazquez MRN: [id number], DOB/AGE: Oct 26, 1935   Admit date: 02/14/2013 Date of Consult: 02/16/2013  Primary Physician: Loura Pardon, MD Primary Cardiologist: New to [facility] Reason for Consultation: Cryptogenic stroke; recommendations regarding Implantable Loop Recorder  History of Present Illness Margaret Vazquez was admitted on 02/14/2013 with acute CVA. She has been monitored on telemetry which has demonstrated no arrhythmias. No cause has been identified. Inpatient stroke work-up is to be completed today with a TEE. EP has been asked to evaluate for placement of an implantable loop recorder to monitor for atrial fibrillation.  Past Medical History Past Medical History  Diagnosis Date  . Hypertension   . Diabetes mellitus     type II  . Hyperlipidemia     myalgias with Lipitor and Zetia  . GERD (gastroesophageal reflux disease)   . Skin cancer     hx of basal cell/ak's  . Colon polyps 2009  . Allergy history, drug     Aspirin  . Cervical stenosis of spine     With neck pain  . CAD (coronary artery disease) 04/08/11    non obst by cath  . Migraine     Past Surgical History Past Surgical History  Procedure Laterality Date  . Appendectomy    . Tubal ligation      BTL  . Breast surgery  1992    breast biopsy  . Eye surgery  2005    tear duct surgery  . Nasal sinus surgery  01/2005  . Cardiac catheterization  1993    LHC: normal coronaries.   . Stress cardiolite  11/1999    Normal/ negative  . Cystoscopy w/ decannulation  03/2000    Normal  . Shoulder surgery  10/2002    Impingement  . Abd [place]  07/2003    Negative  . Tear duct surgery  2005  . Colonoscopy  12/2007    Adenomatous colon polyps  . Cardiac catheterization  04/07/2011    non obst CAD (Dr Burt Knack)    Allergies/Intolerances Allergies  Allergen Reactions  . Bee Venom Hives, Shortness Of Breath and Swelling  . Nabumetone Anaphylaxis  . Amoxicillin-Pot  Clavulanate Hives and Swelling    To lips.  . Aspirin Hives  . Atorvastatin Swelling     joint pain/swelling, inc liver tests  . Clopidogrel Bisulfate Hives  . Codeine Nausea And Vomiting  . Ezetimibe Other (See Comments)     fatigue  . Metformin And Related Other (See Comments)    Diarrhea   . Valsartan Other (See Comments)     fatigue   Inpatient Medications . carvedilol  6.25 mg Oral BID WC  . enoxaparin (LOVENOX) injection  40 mg Subcutaneous Q24H  . insulin aspart  0-5 Units Subcutaneous QHS  . insulin aspart  0-9 Units Subcutaneous TID WC  . olmesartan  40 mg Oral Daily  . pantoprazole  40 mg Oral Q1200  . ticlopidine  250 mg Oral BID WC   . sodium chloride 75 mL/hr at 02/15/13 0831   Social History History   Social History  . Marital Status: Married    Spouse Name: N/A    Number of Children: 3  . Years of Education: N/A   Occupational History  . Retired    Social History Main Topics  . Smoking status: Never Smoker   . Smokeless tobacco: Never Used  . Alcohol Use: No  . Drug Use: No  . Sexual Activity: Not on file  Other Topics Concern  . Not on file   Social History Narrative   3 children   Does not drink caffeinated beverages   Cares for SIL with dementia    Review of Systems General: No chills, fever, night sweats or weight changes  Cardiovascular:  No chest pain, dyspnea on exertion, edema, orthopnea, palpitations, paroxysmal nocturnal dyspnea Dermatological: No rash, lesions or masses Respiratory: No cough, dyspnea Urologic: No hematuria, dysuria Abdominal: No nausea, vomiting, diarrhea, bright red blood per rectum, melena, or hematemesis Neurologic: No visual changes, weakness, changes in mental status All other systems reviewed and are otherwise negative except as noted above.  Physical Exam Blood pressure 166/79, pulse 54, temperature 98 F (36.7 C), temperature source Oral, resp. rate 20, height 5' 9"$  (1.753 m), weight 210 lb 6.4 oz  (95.437 kg), SpO2 97.00%.  General: Well developed, well appearing 77 y.o. female in no acute distress. HEENT: Normocephalic, atraumatic. EOMs intact. Sclera nonicteric. Oropharynx clear.  Neck: Supple without bruits. No JVD. Lungs: Respirations regular and unlabored, CTA bilaterally. No wheezes, rales or rhonchi. Heart: RRR. S1, S2 present. No murmurs, rub, S3 or S4. Abdomen: Soft, non-distended.  Extremities: No clubbing, cyanosis or edema. DP/PT/Radials 2+ and equal bilaterally. Psych: Normal affect. Neuro: Alert and oriented X 3. Moves all extremities spontaneously. Musculoskeletal: No kyphosis. Skin: Intact. Warm and dry. No rashes or petechiae in exposed areas.   Labs Lab Results  Component Value Date   WBC 7.3 02/14/2013   HGB 14.6 02/14/2013   HCT 43.0 02/14/2013   MCV 91.8 02/14/2013   PLT 209 02/14/2013    Recent Labs Lab 02/15/13 0440  NA 141  K 4.1  CL 106  CO2 24  BUN 12  CREATININE 0.79  CALCIUM 8.9  GLUCOSE 112*    Recent Labs  02/14/13 1407  INR 0.95    Radiology/Studies Dg Chest 2 View  02/14/2013   *RADIOLOGY REPORT*  Clinical Data: Stroke, right-sided weakness  CHEST - 2 VIEW  Comparison: 08/05/2012; 04/07/2011; chest CT - 04/07/2011  Findings:  Grossly unchanged cardiac silhouette and mediastinal contours.  The lungs are hyperexpanded with flattening of bilateral hemidiaphragms mild diffuse thickening of the pulmonary interstitium.  Unchanged minimal left basilar linear opacities favored to represent atelectasis.  No new focal airspace opacity.  No pleural effusion or pneumothorax.  No evidence of edema.  Unchanged bones.  IMPRESSION: Hyperexpanded lungs without acute cardiopulmonary disease.   Original Report Authenticated By: Jake Seats, MD   Mr Brain Wo Contrast  02/14/2013   *RADIOLOGY REPORT*  Clinical Data:  Right hand weakness and numbness beginning yesterday. History of diabetes mellitus.  History of hypertension.  MRI HEAD WITHOUT CONTRAST MRA  HEAD WITHOUT CONTRAST  Technique:  Multiplanar, multiecho pulse sequences of the brain and surrounding structures were obtained without intravenous contrast. Angiographic images of the head were obtained using MRA technique without contrast.  Comparison:  CT head 02/14/2013.  MRI/MRA head 08/10/2009.  MRI HEAD  Findings:  Multifocal subcentimeter areas of acute infarction affect the left posterior frontal and parietal cortex and underlying white matter. These are oriented in a radial fashion between the left MCA and PCA territories, suggesting a watershed type insult.  There is no associated hemorrhage or mass effect.  No intracranial mass lesion or hydrocephalus is seen.  Mild atrophy is present.  There is mild chronic microvascular ischemic change in the periventricular and subcortical white matter.  Calvarium intact.  No skull base or upper cervical lesions. Flow voids are  maintained in the major intracranial vascular structures.  No pituitary abnormality or cerebellar tonsillar herniation.  Moderate sinus disease affects the maxillary and ethmoid regions, greater on the right. Air-fluid level left maxillary sinus.  There is also significant fluid accumulation in the right frontal sinus. Mucosal thickening noted in the sphenoid sinus.  Negative orbits.  Compared with prior CT, the infarcts are not visible.  Compared with prior MR, the severe disease of the left middle cerebral artery has progressed.  IMPRESSION: Multifocal areas of acute infarction affect the left posterior frontal and parietal cortex and underlying white matter.  Mild atrophy and chronic microvascular ischemic change.  Pansinusitis, worse on the right.  MRA HEAD  Findings: Widely patent internal carotid arteries.  Widely patent basilar artery with left vertebral dominant.  Right vertebral ends in PICA.  No proximal stenosis of the anterior cerebral arteries, posterior cerebral arteries, or right middle cerebral artery.  Severe diffuse disease of  the left middle cerebral artery M1 segment with multiple tandem stenoses estimated to be 75-90% stenosis. Diminished flow related enhancement of the left MCA trifurcation vessels.  No intracranial aneurysm is demonstrated.  IMPRESSION: Severe proximal intracranial atherosclerotic change affecting the M1 segment left middle cerebral artery.   Original Report Authenticated By: Rolla Flatten, M.D.    2-D echocardiogram  Study Conclusions - Left ventricle: The cavity size was normal. Wall thickness was increased in a pattern of moderate LVH. Systolic function was vigorous. The estimated ejection fraction was in the range of 65% to 70%. Wall motion was normal; there were no regional wall motion abnormalities. Doppler parameters are consistent with abnormal left ventricular relaxation (grade 1 diastolic dysfunction). - Left atrium: The atrium was mildly dilated.   12-lead ECG on admission shows NSR Telemetry reviewed and shows SR; no arrhythmias   Assessment and Plan 1. Cryptogenic stroke 2. Mild, nonobstructive CAD by cath 2012 3. Normal LV function  If the TEE is negative, we recommend loop recorder insertion to monitor for AF. The indication for loop recorder insertion / monitoring for AF in setting of cryptogenic stroke was discussed with the patient. The loop recorder insertion procedure was reviewed in detail including risks and benefits. These risks include but are not limited to bleeding and infection. The patient expressed verbal understanding and agrees to proceed. The patient was also counseled regarding wound care and device follow-up.  Dr. Caryl Comes to see Signed, Ileene Hutchinson 02/16/2013, 8:31 AM

## 2013-02-16 NOTE — Progress Notes (Signed)
Pt. discharged home as ordered. Stroke handout, script, and instructions given to patient. RN called [facility] outpatient pharmacy to inquire about Ticlid and It was ordered.. Patient to pick up med tomorrow at [facility] pharmacy.

## 2013-02-16 NOTE — H&P (View-Only) (Signed)
ELECTROPHYSIOLOGY CONSULT NOTE  Patient ID: Margaret Vazquez MRN: [id number], DOB/AGE: 75/04/1936   Admit date: 02/14/2013 Date of Consult: 02/16/2013  Primary Physician: Marne Tower, MD Primary Cardiologist: New to [facility] Reason for Consultation: Cryptogenic stroke; recommendations regarding Implantable Loop Recorder  History of Present Illness Margaret Vazquez was admitted on 02/14/2013 with acute CVA. She has been monitored on telemetry which has demonstrated no arrhythmias. No cause has been identified. Inpatient stroke work-up is to be completed today with a TEE. EP has been asked to evaluate for placement of an implantable loop recorder to monitor for atrial fibrillation.  Past Medical History Past Medical History  Diagnosis Date  . Hypertension   . Diabetes mellitus     type II  . Hyperlipidemia     myalgias with Lipitor and Zetia  . GERD (gastroesophageal reflux disease)   . Skin cancer     hx of basal cell/ak's  . Colon polyps 2009  . Allergy history, drug     Aspirin  . Cervical stenosis of spine     With neck pain  . CAD (coronary artery disease) 04/08/11    non obst by cath  . Migraine     Past Surgical History Past Surgical History  Procedure Laterality Date  . Appendectomy    . Tubal ligation      BTL  . Breast surgery  1992    breast biopsy  . Eye surgery  2005    tear duct surgery  . Nasal sinus surgery  01/2005  . Cardiac catheterization  1993    LHC: normal coronaries.   . Stress cardiolite  11/1999    Normal/ negative  . Cystoscopy w/ decannulation  03/2000    Normal  . Shoulder surgery  10/2002    Impingement  . Abd [place]  07/2003    Negative  . Tear duct surgery  2005  . Colonoscopy  12/2007    Adenomatous colon polyps  . Cardiac catheterization  04/07/2011    non obst CAD (Dr Cooper)    Allergies/Intolerances Allergies  Allergen Reactions  . Bee Venom Hives, Shortness Of Breath and Swelling  . Nabumetone Anaphylaxis  . Amoxicillin-Pot  Clavulanate Hives and Swelling    To lips.  . Aspirin Hives  . Atorvastatin Swelling     joint pain/swelling, inc liver tests  . Clopidogrel Bisulfate Hives  . Codeine Nausea And Vomiting  . Ezetimibe Other (See Comments)     fatigue  . Metformin And Related Other (See Comments)    Diarrhea   . Valsartan Other (See Comments)     fatigue   Inpatient Medications . carvedilol  6.25 mg Oral BID WC  . enoxaparin (LOVENOX) injection  40 mg Subcutaneous Q24H  . insulin aspart  0-5 Units Subcutaneous QHS  . insulin aspart  0-9 Units Subcutaneous TID WC  . olmesartan  40 mg Oral Daily  . pantoprazole  40 mg Oral Q1200  . ticlopidine  250 mg Oral BID WC   . sodium chloride 75 mL/hr at 02/15/13 0831   Social History History   Social History  . Marital Status: Married    Spouse Name: N/A    Number of Children: 3  . Years of Education: N/A   Occupational History  . Retired    Social History Main Topics  . Smoking status: Never Smoker   . Smokeless tobacco: Never Used  . Alcohol Use: No  . Drug Use: No  . Sexual Activity: Not on file     Other Topics Concern  . Not on file   Social History Narrative   3 children   Does not drink caffeinated beverages   Cares for SIL with dementia    Review of Systems General: No chills, fever, night sweats or weight changes  Cardiovascular:  No chest pain, dyspnea on exertion, edema, orthopnea, palpitations, paroxysmal nocturnal dyspnea Dermatological: No rash, lesions or masses Respiratory: No cough, dyspnea Urologic: No hematuria, dysuria Abdominal: No nausea, vomiting, diarrhea, bright red blood per rectum, melena, or hematemesis Neurologic: No visual changes, weakness, changes in mental status All other systems reviewed and are otherwise negative except as noted above.  Physical Exam Blood pressure 166/79, pulse 54, temperature 98 F (36.7 C), temperature source Oral, resp. rate 20, height 5' 9" (1.753 m), weight 210 lb 6.4 oz  (95.437 kg), SpO2 97.00%.  General: Well developed, well appearing 77 y.o. female in no acute distress. HEENT: Normocephalic, atraumatic. EOMs intact. Sclera nonicteric. Oropharynx clear.  Neck: Supple without bruits. No JVD. Lungs: Respirations regular and unlabored, CTA bilaterally. No wheezes, rales or rhonchi. Heart: RRR. S1, S2 present. No murmurs, rub, S3 or S4. Abdomen: Soft, non-distended.  Extremities: No clubbing, cyanosis or edema. DP/PT/Radials 2+ and equal bilaterally. Psych: Normal affect. Neuro: Alert and oriented X 3. Moves all extremities spontaneously. Musculoskeletal: No kyphosis. Skin: Intact. Warm and dry. No rashes or petechiae in exposed areas.   Labs Lab Results  Component Value Date   WBC 7.3 02/14/2013   HGB 14.6 02/14/2013   HCT 43.0 02/14/2013   MCV 91.8 02/14/2013   PLT 209 02/14/2013    Recent Labs Lab 02/15/13 0440  NA 141  K 4.1  CL 106  CO2 24  BUN 12  CREATININE 0.79  CALCIUM 8.9  GLUCOSE 112*    Recent Labs  02/14/13 1407  INR 0.95    Radiology/Studies Dg Chest 2 View  02/14/2013   *RADIOLOGY REPORT*  Clinical Data: Stroke, right-sided weakness  CHEST - 2 VIEW  Comparison: 08/05/2012; 04/07/2011; chest CT - 04/07/2011  Findings:  Grossly unchanged cardiac silhouette and mediastinal contours.  The lungs are hyperexpanded with flattening of bilateral hemidiaphragms mild diffuse thickening of the pulmonary interstitium.  Unchanged minimal left basilar linear opacities favored to represent atelectasis.  No new focal airspace opacity.  No pleural effusion or pneumothorax.  No evidence of edema.  Unchanged bones.  IMPRESSION: Hyperexpanded lungs without acute cardiopulmonary disease.   Original Report Authenticated By: John Watts V, MD   Mr Brain Wo Contrast  02/14/2013   *RADIOLOGY REPORT*  Clinical Data:  Right hand weakness and numbness beginning yesterday. History of diabetes mellitus.  History of hypertension.  MRI HEAD WITHOUT CONTRAST MRA  HEAD WITHOUT CONTRAST  Technique:  Multiplanar, multiecho pulse sequences of the brain and surrounding structures were obtained without intravenous contrast. Angiographic images of the head were obtained using MRA technique without contrast.  Comparison:  CT head 02/14/2013.  MRI/MRA head 08/10/2009.  MRI HEAD  Findings:  Multifocal subcentimeter areas of acute infarction affect the left posterior frontal and parietal cortex and underlying white matter. These are oriented in a radial fashion between the left MCA and PCA territories, suggesting a watershed type insult.  There is no associated hemorrhage or mass effect.  No intracranial mass lesion or hydrocephalus is seen.  Mild atrophy is present.  There is mild chronic microvascular ischemic change in the periventricular and subcortical white matter.  Calvarium intact.  No skull base or upper cervical lesions. Flow voids are   maintained in the major intracranial vascular structures.  No pituitary abnormality or cerebellar tonsillar herniation.  Moderate sinus disease affects the maxillary and ethmoid regions, greater on the right. Air-fluid level left maxillary sinus.  There is also significant fluid accumulation in the right frontal sinus. Mucosal thickening noted in the sphenoid sinus.  Negative orbits.  Compared with prior CT, the infarcts are not visible.  Compared with prior MR, the severe disease of the left middle cerebral artery has progressed.  IMPRESSION: Multifocal areas of acute infarction affect the left posterior frontal and parietal cortex and underlying white matter.  Mild atrophy and chronic microvascular ischemic change.  Pansinusitis, worse on the right.  MRA HEAD  Findings: Widely patent internal carotid arteries.  Widely patent basilar artery with left vertebral dominant.  Right vertebral ends in PICA.  No proximal stenosis of the anterior cerebral arteries, posterior cerebral arteries, or right middle cerebral artery.  Severe diffuse disease of  the left middle cerebral artery M1 segment with multiple tandem stenoses estimated to be 75-90% stenosis. Diminished flow related enhancement of the left MCA trifurcation vessels.  No intracranial aneurysm is demonstrated.  IMPRESSION: Severe proximal intracranial atherosclerotic change affecting the M1 segment left middle cerebral artery.   Original Report Authenticated By: John Curnes, M.D.    2-D echocardiogram  Study Conclusions - Left ventricle: The cavity size was normal. Wall thickness was increased in a pattern of moderate LVH. Systolic function was vigorous. The estimated ejection fraction was in the range of 65% to 70%. Wall motion was normal; there were no regional wall motion abnormalities. Doppler parameters are consistent with abnormal left ventricular relaxation (grade 1 diastolic dysfunction). - Left atrium: The atrium was mildly dilated.   12-lead ECG on admission shows NSR Telemetry reviewed and shows SR; no arrhythmias   Assessment and Plan 1. Cryptogenic stroke 2. Mild, nonobstructive CAD by cath 2012 3. Normal LV function  If the TEE is negative, we recommend loop recorder insertion to monitor for AF. The indication for loop recorder insertion / monitoring for AF in setting of cryptogenic stroke was discussed with the patient. The loop recorder insertion procedure was reviewed in detail including risks and benefits. These risks include but are not limited to bleeding and infection. The patient expressed verbal understanding and agrees to proceed. The patient was also counseled regarding wound care and device follow-up.  Dr. Klein to see Signed, EDMISTEN, BROOKE 02/16/2013, 8:31 AM    

## 2013-02-16 NOTE — Progress Notes (Signed)
Stroke Team Progress Note  HISTORY Margaret Vazquez is a 77 y.o. female who over the past couple of days has had intermittent episodes of right arm numbness, tingling, dysfunction that has been transient up until this morning 02/14/2013 at which point she has been having persistent problems. Her husband states that she was having difficulty yesterday with eating with her fork. Today, she noticed that she was having difficulty writing and therefore sought evaluation in the emergency room  Patient was not a TPA candidate secondary to delay in arrival. She was admitted for further evaluation and treatment.  SUBJECTIVE Pt in endo recovery area, just back from procedure.  OBJECTIVE Most recent Vital Signs: Filed Vitals:   02/15/13 2134 02/16/13 0200 02/16/13 0600 02/16/13 1014  BP: 128/55 149/77 166/79 140/56  Pulse: 68 57 54 53  Temp: 97.4 F (36.3 C) 97.8 F (36.6 C) 98 F (36.7 C)   TempSrc: Oral Oral Oral   Resp: 20 20 20 20   Height:      Weight:      SpO2: 99% 98% 97% 97%   CBG (last 3)   Recent Labs  02/15/13 1606 02/15/13 2102 02/16/13 0637  GLUCAP 134* 136* 120*    IV Fluid Intake:   . sodium chloride 75 mL/hr at 02/15/13 0831  . sodium chloride      MEDICATIONS  . carvedilol  6.25 mg Oral BID WC  . chlorhexidine  60 mL Topical Once  . enoxaparin (LOVENOX) injection  40 mg Subcutaneous Q24H  . insulin aspart  0-5 Units Subcutaneous QHS  . insulin aspart  0-9 Units Subcutaneous TID WC  . olmesartan  40 mg Oral Daily  . pantoprazole  40 mg Oral Q1200  . ticlopidine  250 mg Oral BID WC   PRN:  acetaminophen, acetaminophen, senna-docusate  Diet:  NPO  Activity:  OOB Up with assistance DVT Prophylaxis:  Lovenox 40 mg sq daily   CLINICALLY SIGNIFICANT STUDIES Basic Metabolic Panel:   Recent Labs Lab 02/14/13 1458 02/15/13 0440  NA 140 141  K 5.3* 4.1  CL 107 106  CO2  --  24  GLUCOSE 75 112*  BUN 14 12  CREATININE 0.90 0.79  CALCIUM  --  8.9   Liver  Function Tests: No results found for this basename: AST, ALT, ALKPHOS, BILITOT, PROT, ALBUMIN,  in the last 168 hours CBC:   Recent Labs Lab 02/14/13 1407 02/14/13 1458  WBC 7.3  --   NEUTROABS 3.4  --   HGB 14.3 14.6  HCT 40.2 43.0  MCV 91.8  --   PLT 209  --    Coagulation:   Recent Labs Lab 02/14/13 1407  LABPROT 12.5  INR 0.95   Cardiac Enzymes: No results found for this basename: CKTOTAL, CKMB, CKMBINDEX, TROPONINI,  in the last 168 hours Urinalysis: No results found for this basename: COLORURINE, APPERANCEUR, LABSPEC, PHURINE, GLUCOSEU, HGBUR, BILIRUBINUR, KETONESUR, PROTEINUR, UROBILINOGEN, NITRITE, LEUKOCYTESUR,  in the last 168 hours Lipid Panel    Component Value Date/Time   CHOL 172 02/15/2013 0440   TRIG 161* 02/15/2013 0440   HDL 41 02/15/2013 0440   CHOLHDL 4.2 02/15/2013 0440   VLDL 32 02/15/2013 0440   LDLCALC 99 02/15/2013 0440   HgbA1C  Lab Results  Component Value Date   HGBA1C 6.5* 02/15/2013    Urine Drug Screen:   No results found for this basename: labopia,  cocainscrnur,  labbenz,  amphetmu,  thcu,  labbarb    Alcohol Level: No  results found for this basename: ETH,  in the last 168 hours  Mr Cervical Spine 02/14/2013  Multilevel spondylosis as described.  There is no dominant right- sided compressive lesion.     CT of the brain  02/14/2013    1.  No acute intracranial process.  2.  Extensive sinus opacification disease as above with air fluid level within the left maxillary sinus, nonspecific but could be seen in the setting of acute sinusitis.   MRI of the brain  02/14/2013   Multifocal areas of acute infarction affect the left posterior frontal and parietal cortex and underlying white matter.  Mild atrophy and chronic microvascular ischemic change.  Pansinusitis, worse on the right.  MRA of the brain  02/14/2013    Severe proximal intracranial atherosclerotic change affecting the M1 segment left middle cerebral artery.   2D Echocardiogram  EF 65-70%  with no source of embolus.   Carotid Doppler  No evidence of hemodynamically significant internal carotid artery stenosis. Vertebral artery flow is antegrade.   TEE 02/16/2013 no source of embolus  CXR  02/14/2013    Hyperexpanded lungs without acute cardiopulmonary disease.   EKG  normal sinus rhythm.   Therapy Recommendations HH PT,   Physical Exam   Pleasant middle aged Caucasian lady not in distress.Awake alert. Afebrile. Head is nontraumatic. Neck is supple without bruit. Hearing is normal. Cardiac exam no murmur or gallop. Lungs are clear to auscultation. Distal pulses are well felt. Neurological Exam : Awake alert oriented x3 normal speech and language function. Extraocular movements are full range without nystagmus. Fundi were not visualized. Vision acuity and fields appear normal. Face is symmetric without weakness. Tongue is midline. Motor system exam reveals no upper or lower eczema to drift but mild weakness of the right grip and intrinsic hand muscles as well as right hip flexors and ankle dorsiflexors. Orbits left-to-right upper extremity. Sensation appears preserved. Gait was not tested.  ASSESSMENT Ms. Margaret Vazquez is a 77 y.o. female presenting with episodes of right arm numbness, tingling, dysfunction. Imaging confirms a left posterior frontal and parietal cortex and underlying white matter infarcts. Infarcts felt to be either due to intrinsic narrowing from thrombotic source or embolic secondary to unknown etiology (? afib).  On no antithrombotics due to aspirin and plavix allergies prior to admission. Now on ticlid alone for secondary stroke prevention (insurance company was not agreeable to pay for ticlid in the past). Patient with resultant mild right hemipareis. Work up underway.  Hypertension Hyperlipidemia, LDL 99, on no statin PTA, now on no statin, at goal LDL < 100 (from stroke standpoint) Diabetes, HgbA1c 6.4, goal < 7.0 CAD, non obstr per cath Hx  migraine  Hospital day # 2  TREATMENT/PLAN  Continue  ticlid for secondary stroke prevention. Will need a letter to insurance company requesting payment of drug due to multiple antiplatelet allergies as they have refused payment in the past.  Due to antiplatelet allergies, no need to look at intracranial vasculature/stenting as pt could not take post procedure asa/plavix  As TEE negative, [place] cardiologist will place implantable loop recorder to evaluate for atrial fibrillation as etiology of stroke.   Prefer OP PT over home health PT if patient has available transportation.   Ok for discharge from stroke standpoint once recovered from loop recorder placement  Dr. Pearlean Brownie discussed diagnosis, prognosis,  treatment options and plan of care with pt, husband and daughter.   SHARON BIBY, MSN, RN, ANVP-BC, ANP-BC, GNP-BC [facility]  Pager: [phone] 02/16/2013 10:24 AM  I have personally obtained a history, examined the patient, evaluated imaging results, and formulated the assessment and plan of care. I agree with the above.  Delia Heady, MD

## 2013-02-16 NOTE — Interval H&P Note (Signed)
History and Physical Interval Note:  02/16/2013 3:41 PM  Margaret Vazquez  has presented today for surgery, with the diagnosis of stroke  The various methods of treatment have been discussed with the patient and family. After consideration of risks, benefits and other options for treatment, the patient has consented to  Procedure(s): LOOP RECORDER IMPLANT (N/A) as a surgical intervention .  The patient's history has been reviewed, patient examined, no change in status, stable for surgery.  I have reviewed the patient's chart and labs.  Questions were answered to the patient's satisfaction.     Sherryl Manges

## 2013-02-16 NOTE — Progress Notes (Signed)
Patient ID: Margaret Vazquez  female  [id number]    DOB: [birth year]    DOA: 02/14/2013  PCP: Loura Pardon, MD  Assessment/Plan:   Acute nonhemorrhagic stroke nightly secondary to thrombotic source or embolic ? Atrial fibrillation  -MRI brain shows multifocal acute infarct left posterior frontal and parietal lobes  -MRI brain shows severe diffuse disease left MCA, M1 segment  -TEE done today showed EF 65%, LVH, no ASD and a negative bubble study, No LAA thrombus  - Implantable loop recorder procedure pending today -Continue Ticlid (allergy to ASA and plavix)--due to insurance issues, the patient has been off this medication for about 1year, Dr Leonie Man to write a letter to the insurance company. - Carotid Doppler showed bilateral 1-39% ICA stenosis.  - 2D Echocardiogram: EF 65-70% with no source of embolus    -LDL 99, goal LDL less than 100 from stroke standpoint, not on statin   Diabetes mellitus type 2  -Continue NovoLog sliding scale  -Patient was on glipizide preadmission  -hemoglobin A1c 6.5   Hypertension  -Continue Benicar   nonobstructive coronary artery disease  -Continue carvedilol   Hyperlipidemia  -Patient has numerous intolerances to statins  -Continue Questran  DVT Prophylaxis:  Code Status:  Disposition: Hopefully  DC home if loop recorder done in time and okayed by cardiology for dc    Subjective: Denies any specific complaints, ambulating in the room without any difficulty  Objective: Weight change:  No intake or output data in the 24 hours ending 02/16/13 1428 Blood pressure 146/66, pulse 54, temperature 97.8 F (36.6 C), temperature source Oral, resp. rate 16, height '5\' 9"'$  (1.753 m), weight 95.437 kg (210 lb 6.4 oz), SpO2 95.00%.  Physical Exam: General: Alert and awake, oriented x3, NAD. CVS: S1-S2 clear, no murmur rubs or gallops Chest: clear to auscultation bilaterally, no wheezing, rales or rhonchi Abdomen: soft nontender, nondistended, normal  bowel sounds  Extremities: no cyanosis, clubbing or edema noted bilaterally   Lab Results: Basic Metabolic Panel:  Recent Labs Lab 02/14/13 1458 02/15/13 0440  NA 140 141  K 5.3* 4.1  CL 107 106  CO2  --  24  GLUCOSE 75 112*  BUN 14 12  CREATININE 0.90 0.79  CALCIUM  --  8.9   Liver Function Tests: No results found for this basename: AST, ALT, ALKPHOS, BILITOT, PROT, ALBUMIN,  in the last 168 hours No results found for this basename: LIPASE, AMYLASE,  in the last 168 hours No results found for this basename: AMMONIA,  in the last 168 hours CBC:  Recent Labs Lab 02/14/13 1407 02/14/13 1458  WBC 7.3  --   NEUTROABS 3.4  --   HGB 14.3 14.6  HCT 40.2 43.0  MCV 91.8  --   PLT 209  --    Cardiac Enzymes: No results found for this basename: CKTOTAL, CKMB, CKMBINDEX, TROPONINI,  in the last 168 hours BNP: No components found with this basename: POCBNP,  CBG:  Recent Labs Lab 02/15/13 1143 02/15/13 1606 02/15/13 2102 02/16/13 0637 02/16/13 1308  GLUCAP 102* 134* 136* 120* 121*     Micro Results: No results found for this or any previous visit (from the past 240 hour(s)).  Studies/Results: Dg Chest 2 View  02/14/2013   *RADIOLOGY REPORT*  Clinical Data: Stroke, right-sided weakness  CHEST - 2 VIEW  Comparison: 08/05/2012; 04/07/2011; chest CT - 04/07/2011  Findings:  Grossly unchanged cardiac silhouette and mediastinal contours.  The lungs are hyperexpanded with flattening of bilateral hemidiaphragms  mild diffuse thickening of the pulmonary interstitium.  Unchanged minimal left basilar linear opacities favored to represent atelectasis.  No new focal airspace opacity.  No pleural effusion or pneumothorax.  No evidence of edema.  Unchanged bones.  IMPRESSION: Hyperexpanded lungs without acute cardiopulmonary disease.   Original Report Authenticated By: Jake Seats, MD   Ct Head (brain) Wo Contrast  02/14/2013   *RADIOLOGY REPORT*  Clinical Data: Right-sided  weakness, history of stroke  CT HEAD WITHOUT CONTRAST  Technique:  Contiguous axial images were obtained from the base of the skull through the vertex without contrast.  Comparison: 08/22/2011; 04/09/2011  Findings:  Pearline Cables white differentiation is maintained.  No CT evidence of acute large territory infarct.  No intraparenchymal or extra-axial mass or hemorrhage.  Normal size and configuration of the ventricles and basilar cisterns.  No midline shift.  There is near complete opacification of the right maxillary and frontal sinuses. There is scattered opacification of the right ethmoidal air cells.  An air fluid level is noted within the left maxillary sinus. There is normal aeration of the bilateral mastoid air cells.  Regional soft tissues are normal. Mild hyperostosis frontalis.   No displaced calvarial fracture.  IMPRESSION: 1.  No acute intracranial process.  2.  Extensive sinus opacification disease as above with air fluid level within the left maxillary sinus, nonspecific but could be seen in the setting of acute sinusitis.   Original Report Authenticated By: Jake Seats, MD   Mr [facility] Wo Contrast  02/14/2013   *RADIOLOGY REPORT*  Clinical Data:  Right hand weakness and numbness beginning yesterday. History of diabetes mellitus.  History of hypertension.  MRI HEAD WITHOUT CONTRAST MRA HEAD WITHOUT CONTRAST  Technique:  Multiplanar, multiecho pulse sequences of the brain and surrounding structures were obtained without intravenous contrast. Angiographic images of the head were obtained using MRA technique without contrast.  Comparison:  CT head 02/14/2013.  MRI/MRA head 08/10/2009.  MRI HEAD  Findings:  Multifocal subcentimeter areas of acute infarction affect the left posterior frontal and parietal cortex and underlying white matter. These are oriented in a radial fashion between the left MCA and PCA territories, suggesting a watershed type insult.  There is no associated hemorrhage or mass effect.  No  intracranial mass lesion or hydrocephalus is seen.  Mild atrophy is present.  There is mild chronic microvascular ischemic change in the periventricular and subcortical white matter.  Calvarium intact.  No skull base or upper cervical lesions. Flow voids are maintained in the major intracranial vascular structures.  No pituitary abnormality or cerebellar tonsillar herniation.  Moderate sinus disease affects the maxillary and ethmoid regions, greater on the right. Air-fluid level left maxillary sinus.  There is also significant fluid accumulation in the right frontal sinus. Mucosal thickening noted in the sphenoid sinus.  Negative orbits.  Compared with prior CT, the infarcts are not visible.  Compared with prior MR, the severe disease of the left middle cerebral artery has progressed.  IMPRESSION: Multifocal areas of acute infarction affect the left posterior frontal and parietal cortex and underlying white matter.  Mild atrophy and chronic microvascular ischemic change.  Pansinusitis, worse on the right.  MRA HEAD  Findings: Widely patent internal carotid arteries.  Widely patent basilar artery with left vertebral dominant.  Right vertebral ends in PICA.  No proximal stenosis of the anterior cerebral arteries, posterior cerebral arteries, or right middle cerebral artery.  Severe diffuse disease of the left middle cerebral artery M1 segment with  multiple tandem stenoses estimated to be 75-90% stenosis. Diminished flow related enhancement of the left MCA trifurcation vessels.  No intracranial aneurysm is demonstrated.  IMPRESSION: Severe proximal intracranial atherosclerotic change affecting the M1 segment left middle cerebral artery.   Original Report Authenticated By: Rolla Flatten, M.D.   Mr Brain Wo Contrast  02/14/2013   *RADIOLOGY REPORT*  Clinical Data:  Right hand weakness and numbness beginning yesterday. History of diabetes mellitus.  History of hypertension.  MRI HEAD WITHOUT CONTRAST MRA HEAD WITHOUT  CONTRAST  Technique:  Multiplanar, multiecho pulse sequences of the brain and surrounding structures were obtained without intravenous contrast. Angiographic images of the head were obtained using MRA technique without contrast.  Comparison:  CT head 02/14/2013.  MRI/MRA head 08/10/2009.  MRI HEAD  Findings:  Multifocal subcentimeter areas of acute infarction affect the left posterior frontal and parietal cortex and underlying white matter. These are oriented in a radial fashion between the left MCA and PCA territories, suggesting a watershed type insult.  There is no associated hemorrhage or mass effect.  No intracranial mass lesion or hydrocephalus is seen.  Mild atrophy is present.  There is mild chronic microvascular ischemic change in the periventricular and subcortical white matter.  Calvarium intact.  No skull base or upper cervical lesions. Flow voids are maintained in the major intracranial vascular structures.  No pituitary abnormality or cerebellar tonsillar herniation.  Moderate sinus disease affects the maxillary and ethmoid regions, greater on the right. Air-fluid level left maxillary sinus.  There is also significant fluid accumulation in the right frontal sinus. Mucosal thickening noted in the sphenoid sinus.  Negative orbits.  Compared with prior CT, the infarcts are not visible.  Compared with prior MR, the severe disease of the left middle cerebral artery has progressed.  IMPRESSION: Multifocal areas of acute infarction affect the left posterior frontal and parietal cortex and underlying white matter.  Mild atrophy and chronic microvascular ischemic change.  Pansinusitis, worse on the right.  MRA HEAD  Findings: Widely patent internal carotid arteries.  Widely patent basilar artery with left vertebral dominant.  Right vertebral ends in PICA.  No proximal stenosis of the anterior cerebral arteries, posterior cerebral arteries, or right middle cerebral artery.  Severe diffuse disease of the left  middle cerebral artery M1 segment with multiple tandem stenoses estimated to be 75-90% stenosis. Diminished flow related enhancement of the left MCA trifurcation vessels.  No intracranial aneurysm is demonstrated.  IMPRESSION: Severe proximal intracranial atherosclerotic change affecting the M1 segment left middle cerebral artery.   Original Report Authenticated By: Rolla Flatten, M.D.   Mr Cervical Spine Wo Contrast  02/14/2013   *RADIOLOGY REPORT*  Clinical Data: Right hand weakness and numbness.  MRI CERVICAL SPINE WITHOUT CONTRAST  Technique:  Multiplanar and multiecho pulse sequences of the cervical spine, to include the craniocervical junction and cervicothoracic junction, were obtained according to standard protocol without intravenous contrast.  Comparison: See report of MRI brain performed concurrently.  Findings: Mild reversal of the normal cervical lordotic curve. Disc space narrowing C6-C7.  Normal marrow signal.  Normal cord size and signal.  The individual disc spaces were examined as follows:  C2-3:  Normal.  C3-4:  Normal.  C4-5:  Mild bulge with left-sided uncinate spurring.  Slight effacement anterior subarachnoid space.  Left-sided foraminal narrowing affects the C5 nerve root.  C5-6:  Central and leftward protrusion.  Bilateral uncinate spurring.  Bilateral facet arthropathy.  Left greater than right C6 nerve root impingement.  Mild central  canal stenosis.  C6-7:  Left-sided uncinate spurring and facet arthropathy narrows the foramen, likely affecting the C7 nerve root.  C7-T1:  Central protrusion.  No impingement.  IMPRESSION: Multilevel spondylosis as described.  There is no dominant right- sided compressive lesion.   Original Report Authenticated By: Rolla Flatten, M.D.    Medications: Scheduled Meds: . carvedilol  6.25 mg Oral BID WC  . enoxaparin (LOVENOX) injection  40 mg Subcutaneous Q24H  . insulin aspart  0-5 Units Subcutaneous QHS  . insulin aspart  0-9 Units Subcutaneous TID WC   . olmesartan  40 mg Oral Daily  . pantoprazole  40 mg Oral Q1200  . ticlopidine  250 mg Oral BID WC      LOS: 2 days   Eria Lozoya M.D. [facility] 02/16/2013, 2:28 PM Pager: [phone]  If 7PM-7AM, please contact night-coverage [email or web address] Password [email or web address]

## 2013-02-16 NOTE — Progress Notes (Signed)
  Echocardiogram Echocardiogram Transesophageal has been performed.  Mahdiya Mossberg FRANCES 02/16/2013, 11:35 AM

## 2013-02-16 NOTE — Progress Notes (Signed)
PT Cancel Note:   Pt off floor for procedure (TEE).  Will attempt back later today if time allows.     Verdell Face, [place] [phone] 02/16/2013

## 2013-02-17 ENCOUNTER — Telehealth: Payer: Self-pay | Admitting: Internal Medicine

## 2013-02-17 ENCOUNTER — Encounter (HOSPITAL_COMMUNITY): Payer: Self-pay | Admitting: Cardiovascular Disease

## 2013-02-17 NOTE — Telephone Encounter (Signed)
Pt states pain started today but felt better after laying down. She states, "it feels like a brick weighing down my breast." She asked if discomfort is typical, I reassured her some discomfort is normal.  I instructed her to call mid next week if symptoms have not improved at all.    ROV for wound check 02/28/13 Margaret Vazquez

## 2013-02-17 NOTE — Telephone Encounter (Signed)
New prob  Pt states she got a loop placed and it feels as if it is pulling her breast down. She is feeling a heaviness in her chest.

## 2013-02-28 ENCOUNTER — Ambulatory Visit (INDEPENDENT_AMBULATORY_CARE_PROVIDER_SITE_OTHER): Payer: Medicare Other | Admitting: *Deleted

## 2013-02-28 DIAGNOSIS — I6350 Cerebral infarction due to unspecified occlusion or stenosis of unspecified cerebral artery: Secondary | ICD-10-CM

## 2013-02-28 NOTE — Progress Notes (Signed)
Pt seen in device clinic for follow up of recently implanted ILR.  Wound well healed.  No redness, swelling, or edema.  Steri-strips removed today.   Device interrogated and found to be functioning normally.  See PaceArt for full details.  Pt denies chest pain, shortness of breath, palpitations, or dizziness.  Pt to follow up with Dr. Graciela Husbands 06/07/13.   Weston Anna 02/28/2013 3:49 PM

## 2013-03-01 ENCOUNTER — Encounter: Payer: Self-pay | Admitting: Family Medicine

## 2013-03-01 ENCOUNTER — Ambulatory Visit (INDEPENDENT_AMBULATORY_CARE_PROVIDER_SITE_OTHER): Payer: Medicare Other | Admitting: Family Medicine

## 2013-03-01 VITALS — BP 122/72 | HR 59 | Temp 98.1°F | Ht 68.5 in | Wt 204.5 lb

## 2013-03-01 DIAGNOSIS — I6350 Cerebral infarction due to unspecified occlusion or stenosis of unspecified cerebral artery: Secondary | ICD-10-CM

## 2013-03-01 DIAGNOSIS — E119 Type 2 diabetes mellitus without complications: Secondary | ICD-10-CM

## 2013-03-01 DIAGNOSIS — I10 Essential (primary) hypertension: Secondary | ICD-10-CM

## 2013-03-01 DIAGNOSIS — I639 Cerebral infarction, unspecified: Secondary | ICD-10-CM

## 2013-03-01 DIAGNOSIS — E785 Hyperlipidemia, unspecified: Secondary | ICD-10-CM

## 2013-03-01 NOTE — Assessment & Plan Note (Signed)
L posterior frontal parietal complex CVA dx by MRI with extensive w/u for embolic source Rev hosp records /studies and labs in detail Will f/u with cardiol to proceed with loop recorder  F/u pending with Dr Pearlean Brownie as well  On ticlid since she is allergic to asa and plavix (is going to have issues affording this however) wil continue to follow Rev cva s/s and need to call 911 if these occur Will work to opt control chol/ DM and HTN

## 2013-03-01 NOTE — Assessment & Plan Note (Signed)
Lab Results  Component Value Date   HGBA1C 6.5* 02/15/2013   Well controlled Disc lifstyle habits and goals for control

## 2013-03-01 NOTE — Assessment & Plan Note (Signed)
Disc goals for lipids and reasons to control them Rev labs with pt Rev low sat fat diet in detail   

## 2013-03-01 NOTE — Assessment & Plan Note (Signed)
bp in fair control at this time  No changes needed  Disc lifstyle change with low sodium diet and exercise  Control is important in light of recent stroke

## 2013-03-01 NOTE — Progress Notes (Signed)
Subjective:    Patient ID: Margaret Vazquez, female    DOB: 02-Nov-1935, 77 y.o.   MRN: [id number]  HPI Here for hosp f/u s/p CVA  Pt was adm 8/11 to 8/13 for L posterior/ frontal and parietal complex CVA (presumed embolic)- with R arm symptoms  Work up in hosp incl Nl CT Pos MRI for CVA and also MCA dz TEE and 2D echo-no source of embolus and nl EF Carotid ICA 1-39% stenosis- mild   No persistent neurol deficits  Had presented with R arm symptoms - weakness and numbness  (which she let go for a while) Her bp was high at the time   Stable labs Lab Results  Component Value Date   HGBA1C 6.5* 02/15/2013   good control  Lab Results  Component Value Date   CHOL 172 02/15/2013   HDL 41 02/15/2013   LDLCALC 99 02/15/2013   LDLDIRECT 136.5 10/20/2011   TRIG 161* 02/15/2013   CHOLHDL 4.2 02/15/2013    On questran at goal   Since she is allergy of asa and plavix- now on ticlid  (which she cannot afford)  Plans loop recorder- f/u cardiol and also neuro   bp is stable today  No cp or palpitations or headaches or edema  No side effects to medicines  BP Readings from Last 3 Encounters:  03/01/13 122/72  02/16/13 151/58  02/16/13 151/58      Patient Active Problem List   Diagnosis Date Noted  . Acute ischemic stroke 02/15/2013  . Numbness and tingling of right arm 02/14/2013  . Encounter for Medicare annual wellness exam 10/27/2012  . UTI (urinary tract infection) 10/20/2012  . Tick bite 09/29/2012  . Encopresis 12/15/2011  . Post-menopausal 10/28/2011  . Abnormal urine odor 07/30/2011  . Diarrhea 07/30/2011  . PERSONAL HX COLONIC POLYPS 09/04/2009  . VITAMIN B12 DEFICIENCY 03/06/2009  . UNSPECIFIED ANEMIA 02/13/2009  . ALLERGIC RHINITIS 02/14/2008  . BACK PAIN, LUMBAR 11/03/2007  . HYPERLIPIDEMIA 11/24/2006  . HYPERTENSION, ESSENTIAL NOS 11/24/2006  . DIABETES MELLITUS, TYPE II 10/09/2006  . GERD 10/09/2006  . OVERACTIVE BLADDER 10/09/2006  . INCONTINENCE, URGE  10/09/2006  . SKIN CANCER, HX OF 10/09/2006   Past Medical History  Diagnosis Date  . Hypertension   . Diabetes mellitus     type II  . Hyperlipidemia     myalgias with Lipitor and Zetia  . GERD (gastroesophageal reflux disease)   . Skin cancer     hx of basal cell/ak's  . Colon polyps 2009  . Allergy history, drug     Aspirin  . Cervical stenosis of spine     With neck pain  . CAD (coronary artery disease) 04/08/11    non obst by cath  . Migraine    Past Surgical History  Procedure Laterality Date  . Appendectomy    . Tubal ligation      BTL  . Breast surgery  1992    breast biopsy  . Eye surgery  2005    tear duct surgery  . Nasal sinus surgery  01/2005  . Cardiac catheterization  1993    LHC: normal coronaries.   . Stress cardiolite  11/1999    Normal/ negative  . Cystoscopy w/ decannulation  03/2000    Normal  . Abd [place]  07/2003    Negative  . Tear duct surgery  2005  . Colonoscopy  12/2007    Adenomatous colon polyps  . Cardiac catheterization  04/07/2011  non obst CAD (Dr Excell Seltzer)  . Tee without cardioversion N/A 02/16/2013    Procedure: TRANSESOPHAGEAL ECHOCARDIOGRAM (TEE);  Surgeon: Wendall Stade, MD;  Location: [facility] ENDOSCOPY;  Service: Cardiovascular;  Laterality: N/A;   History  Substance Use Topics  . Smoking status: Never Smoker   . Smokeless tobacco: Never Used  . Alcohol Use: No   Family History  Problem Relation Age of Onset  . Lung cancer Brother   . Colon cancer Neg Hx   . Skin cancer Daughter   . Diabetes Sister   . Diabetes Brother    Allergies  Allergen Reactions  . Bee Venom Hives, Shortness Of Breath and Swelling  . Nabumetone Anaphylaxis  . Amoxicillin-Pot Clavulanate Hives and Swelling    To lips.  . Aspirin Hives  . Atorvastatin Swelling     joint pain/swelling, inc liver tests  . Clopidogrel Bisulfate Hives  . Codeine Nausea And Vomiting  . Ezetimibe Other (See Comments)     fatigue  . Metformin And Related Other (See  Comments)    Diarrhea   . Valsartan Other (See Comments)     fatigue   Current Outpatient Prescriptions on File Prior to Visit  Medication Sig Dispense Refill  . carvedilol (COREG) 6.25 MG tablet Take 1 tablet (6.25 mg total) by mouth 2 (two) times daily with a meal.  60 tablet  11  . cholestyramine (QUESTRAN) 4 G packet 1 packet as needed. diarrhea      . EPINEPHrine (EPI-PEN) 0.3 mg/0.3 mL DEVI Inject 0.3 mg into the muscle once.      Marland Kitchen glipiZIDE (GLUCOTROL XL) 5 MG 24 hr tablet Take 1 tablet (5 mg total) by mouth daily.  30 tablet  11  . olmesartan (BENICAR) 40 MG tablet Take 1 tablet (40 mg total) by mouth daily.  30 tablet  11  . ticlopidine (TICLID) 250 MG tablet Take 1 tablet (250 mg total) by mouth 2 (two) times daily with a meal.  60 tablet  4   No current facility-administered medications on file prior to visit.    Review of Systems    Review of Systems  Constitutional: Negative for fever, appetite change, fatigue and unexpected weight change.  Eyes: Negative for pain and visual disturbance.  ENT pos for chronic congestion/ neg for sinus pain or purulent d/c Respiratory: Negative for cough and shortness of breath.   Cardiovascular: Negative for cp or palpitations    Gastrointestinal: Negative for nausea, diarrhea and constipation.  Genitourinary: Negative for urgency and frequency.  Skin: Negative for pallor or rash   Neurological: Negative for weakness, light-headedness, numbness and headaches.  Hematological: Negative for adenopathy. Does not bruise/bleed easily.  Psychiatric/Behavioral: Negative for dysphoric mood. The patient is not nervous/anxious.      Objective:   Physical Exam  Constitutional: She appears well-developed and well-nourished. No distress.  overwt and well app  HENT:  Head: Normocephalic and atraumatic.  Mouth/Throat: Oropharynx is clear and moist. No oropharyngeal exudate.  No facial droop Normal speech  Eyes: Conjunctivae and EOM are normal.  Pupils are equal, round, and reactive to light. Right eye exhibits no discharge. Left eye exhibits no discharge. No scleral icterus.  Neck: Normal range of motion. Neck supple. No JVD present. Carotid bruit is not present. No thyromegaly present.  Cardiovascular: Normal rate, regular rhythm, normal heart sounds and intact distal pulses.  Exam reveals no gallop.   No murmur heard. Pulmonary/Chest: Breath sounds normal. No respiratory distress. She has no wheezes. She  has no rales.  No crackles   Abdominal: Soft. Bowel sounds are normal. She exhibits no distension, no abdominal bruit and no mass. There is no tenderness.  Musculoskeletal: She exhibits no edema and no tenderness.  Lymphadenopathy:    She has no cervical adenopathy.  Neurological: She is alert. She has normal reflexes. She displays no atrophy and no tremor. No cranial nerve deficit or sensory deficit. She exhibits normal muscle tone. Coordination and gait normal.  No cerebellar signs No neurol deficits   Skin: Skin is warm and dry. No rash noted. No erythema. No pallor.  Psychiatric: She has a normal mood and affect.          Assessment & Plan:

## 2013-03-01 NOTE — Patient Instructions (Addendum)
I'm glad you are feeling better  Ask to talk to Margaret Vazquez or Margaret Vazquez when you check out - in reference to your problems with the triage nurse response- this is very important  Stay on the ticlid -that is most important as well  If any stoke symptoms develop again- go directly to the ER  bp and cholesterol and sugar are controlled

## 2013-03-02 LAB — PACEMAKER DEVICE OBSERVATION

## 2013-03-24 ENCOUNTER — Encounter: Payer: Self-pay | Admitting: Internal Medicine

## 2013-03-24 ENCOUNTER — Ambulatory Visit (INDEPENDENT_AMBULATORY_CARE_PROVIDER_SITE_OTHER): Payer: Medicare Other | Admitting: *Deleted

## 2013-03-24 DIAGNOSIS — I6350 Cerebral infarction due to unspecified occlusion or stenosis of unspecified cerebral artery: Secondary | ICD-10-CM

## 2013-03-24 DIAGNOSIS — I639 Cerebral infarction, unspecified: Secondary | ICD-10-CM

## 2013-04-02 LAB — PACEMAKER DEVICE OBSERVATION

## 2013-04-11 ENCOUNTER — Encounter: Payer: Self-pay | Admitting: *Deleted

## 2013-04-12 ENCOUNTER — Encounter: Payer: Self-pay | Admitting: Internal Medicine

## 2013-04-20 ENCOUNTER — Ambulatory Visit (INDEPENDENT_AMBULATORY_CARE_PROVIDER_SITE_OTHER): Payer: Medicare Other | Admitting: *Deleted

## 2013-04-20 DIAGNOSIS — I6350 Cerebral infarction due to unspecified occlusion or stenosis of unspecified cerebral artery: Secondary | ICD-10-CM

## 2013-04-22 ENCOUNTER — Telehealth: Payer: Self-pay | Admitting: Family Medicine

## 2013-04-22 NOTE — Telephone Encounter (Signed)
[facility]calls with [employer] called to let you know patient's lab results. Patient's A1c 6.3,LDL 117.  Copy of results were sent to the office.

## 2013-04-24 ENCOUNTER — Inpatient Hospital Stay (HOSPITAL_COMMUNITY): Payer: Medicare Other

## 2013-04-24 ENCOUNTER — Emergency Department (HOSPITAL_COMMUNITY): Payer: Medicare Other

## 2013-04-24 ENCOUNTER — Inpatient Hospital Stay (HOSPITAL_COMMUNITY)
Admission: EM | Admit: 2013-04-24 | Discharge: 2013-04-26 | DRG: 066 | Disposition: A | Discharge status: Home / Self Care | Payer: Medicare Other | Attending: Family Medicine | Admitting: Family Medicine

## 2013-04-24 ENCOUNTER — Encounter (HOSPITAL_COMMUNITY): Payer: Self-pay | Admitting: Emergency Medicine

## 2013-04-24 DIAGNOSIS — W57XXXA Bitten or stung by nonvenomous insect and other nonvenomous arthropods, initial encounter: Secondary | ICD-10-CM

## 2013-04-24 DIAGNOSIS — Z860100 Personal history of colon polyps, unspecified: Secondary | ICD-10-CM

## 2013-04-24 DIAGNOSIS — D649 Anemia, unspecified: Secondary | ICD-10-CM

## 2013-04-24 DIAGNOSIS — Z85828 Personal history of other malignant neoplasm of skin: Secondary | ICD-10-CM

## 2013-04-24 DIAGNOSIS — N3941 Urge incontinence: Secondary | ICD-10-CM

## 2013-04-24 DIAGNOSIS — N390 Urinary tract infection, site not specified: Secondary | ICD-10-CM

## 2013-04-24 DIAGNOSIS — Z886 Allergy status to analgesic agent status: Secondary | ICD-10-CM

## 2013-04-24 DIAGNOSIS — Z23 Encounter for immunization: Secondary | ICD-10-CM

## 2013-04-24 DIAGNOSIS — E1169 Type 2 diabetes mellitus with other specified complication: Secondary | ICD-10-CM | POA: Diagnosis present

## 2013-04-24 DIAGNOSIS — I10 Essential (primary) hypertension: Secondary | ICD-10-CM

## 2013-04-24 DIAGNOSIS — M545 Low back pain, unspecified: Secondary | ICD-10-CM

## 2013-04-24 DIAGNOSIS — I639 Cerebral infarction, unspecified: Secondary | ICD-10-CM

## 2013-04-24 DIAGNOSIS — K219 Gastro-esophageal reflux disease without esophagitis: Secondary | ICD-10-CM

## 2013-04-24 DIAGNOSIS — I6789 Other cerebrovascular disease: Secondary | ICD-10-CM | POA: Diagnosis present

## 2013-04-24 DIAGNOSIS — Z8673 Personal history of transient ischemic attack (TIA), and cerebral infarction without residual deficits: Secondary | ICD-10-CM

## 2013-04-24 DIAGNOSIS — R8290 Unspecified abnormal findings in urine: Secondary | ICD-10-CM

## 2013-04-24 DIAGNOSIS — E119 Type 2 diabetes mellitus without complications: Secondary | ICD-10-CM

## 2013-04-24 DIAGNOSIS — E785 Hyperlipidemia, unspecified: Secondary | ICD-10-CM

## 2013-04-24 DIAGNOSIS — J309 Allergic rhinitis, unspecified: Secondary | ICD-10-CM

## 2013-04-24 DIAGNOSIS — R29898 Other symptoms and signs involving the musculoskeletal system: Secondary | ICD-10-CM

## 2013-04-24 DIAGNOSIS — R197 Diarrhea, unspecified: Secondary | ICD-10-CM

## 2013-04-24 DIAGNOSIS — N318 Other neuromuscular dysfunction of bladder: Secondary | ICD-10-CM

## 2013-04-24 DIAGNOSIS — Z79899 Other long term (current) drug therapy: Secondary | ICD-10-CM

## 2013-04-24 DIAGNOSIS — R159 Full incontinence of feces: Secondary | ICD-10-CM

## 2013-04-24 DIAGNOSIS — R200 Anesthesia of skin: Secondary | ICD-10-CM

## 2013-04-24 DIAGNOSIS — I2510 Atherosclerotic heart disease of native coronary artery without angina pectoris: Secondary | ICD-10-CM | POA: Diagnosis present

## 2013-04-24 DIAGNOSIS — Z780 Asymptomatic menopausal state: Secondary | ICD-10-CM

## 2013-04-24 DIAGNOSIS — Z0000 Encounter for general adult medical examination without abnormal findings: Secondary | ICD-10-CM

## 2013-04-24 DIAGNOSIS — E538 Deficiency of other specified B group vitamins: Secondary | ICD-10-CM

## 2013-04-24 DIAGNOSIS — I6350 Cerebral infarction due to unspecified occlusion or stenosis of unspecified cerebral artery: Principal | ICD-10-CM

## 2013-04-24 HISTORY — DX: Transient cerebral ischemic attack, unspecified: G45.9

## 2013-04-24 LAB — COMPREHENSIVE METABOLIC PANEL
ALT: 15 U/L (ref 0–35)
AST: 22 U/L (ref 0–37)
Albumin: 3.6 g/dL (ref 3.5–5.2)
Alkaline Phosphatase: 91 U/L (ref 39–117)
BUN: 11 mg/dL (ref 6–23)
CO2: 24 mEq/L (ref 19–32)
Calcium: 9.1 mg/dL (ref 8.4–10.5)
Chloride: 103 mEq/L (ref 96–112)
Creatinine, Ser: 0.77 mg/dL (ref 0.50–1.10)
GFR calc Af Amer: >90 mL/min (ref >90)
GFR calc non Af Amer: 80 mL/min — ABNORMAL LOW (ref >90)
Glucose, Bld: 121 mg/dL — ABNORMAL HIGH (ref 70–99)
Potassium: 4.6 mEq/L (ref 3.5–5.1)
Sodium: 137 mEq/L (ref 135–145)
Total Bilirubin: 0.2 mg/dL — ABNORMAL LOW (ref 0.3–1.2)
Total Protein: 7.4 g/dL (ref 6.0–8.3)

## 2013-04-24 LAB — POCT I-STAT, CHEM 8
BUN: 15 mg/dL (ref 6–23)
Calcium, Ion: 1.09 mmol/L — ABNORMAL LOW (ref 1.13–1.30)
Chloride: 108 mEq/L (ref 96–112)
Creatinine, Ser: 0.9 mg/dL (ref 0.50–1.10)
Glucose, Bld: 122 mg/dL — ABNORMAL HIGH (ref 70–99)
HCT: 42 % (ref 36.0–46.0)
Hemoglobin: 14.3 g/dL (ref 12.0–15.0)
Potassium: 7.6 mEq/L (ref 3.5–5.1)
Sodium: 138 mEq/L (ref 135–145)
TCO2: 24 mmol/L (ref 0–100)

## 2013-04-24 LAB — APTT: aPTT: 28 seconds (ref 24–37)

## 2013-04-24 LAB — CBC
HCT: 38.9 % (ref 36.0–46.0)
Hemoglobin: 13.9 g/dL (ref 12.0–15.0)
MCH: 33 pg (ref 26.0–34.0)
MCHC: 35.7 g/dL (ref 30.0–36.0)
MCV: 92.4 fL (ref 78.0–100.0)
Platelets: 205 10*3/uL (ref 150–400)
RBC: 4.21 MIL/uL (ref 3.87–5.11)
RDW: 12.9 % (ref 11.5–15.5)
WBC: 5.8 10*3/uL (ref 4.0–10.5)

## 2013-04-24 LAB — POCT I-STAT TROPONIN I: Troponin i, poc: 0.01 ng/mL (ref 0.00–0.08)

## 2013-04-24 LAB — DIFFERENTIAL
Basophils Absolute: 0 10*3/uL (ref 0.0–0.1)
Basophils Relative: 1 % (ref 0–1)
Eosinophils Absolute: 0.3 10*3/uL (ref 0.0–0.7)
Eosinophils Relative: 5 % (ref 0–5)
Lymphocytes Relative: 44 % (ref 12–46)
Lymphs Abs: 2.5 10*3/uL (ref 0.7–4.0)
Monocytes Absolute: 0.5 10*3/uL (ref 0.1–1.0)
Monocytes Relative: 9 % (ref 3–12)
Neutro Abs: 2.5 10*3/uL (ref 1.7–7.7)
Neutrophils Relative %: 43 % (ref 43–77)

## 2013-04-24 LAB — TROPONIN I: Troponin I: <0.3 ng/mL (ref <0.30)

## 2013-04-24 LAB — PROTIME-INR
INR: 0.95 (ref 0.00–1.49)
Prothrombin Time: 12.5 seconds (ref 11.6–15.2)

## 2013-04-24 LAB — GLUCOSE, CAPILLARY: Glucose-Capillary: 124 mg/dL — ABNORMAL HIGH (ref 70–99)

## 2013-04-24 MED ORDER — SODIUM CHLORIDE 0.9 % IV SOLN: INTRAVENOUS | Status: AC

## 2013-04-24 MED ORDER — CARVEDILOL 6.25 MG PO TABS
6.2500 mg | ORAL_TABLET | Freq: Two times a day (BID) | ORAL | Status: DC
  Administered 2013-04-24 – 2013-04-26 (×4): 6.25 mg via ORAL
  Filled 2013-04-24 (×7): qty 1

## 2013-04-24 MED ORDER — GLIPIZIDE ER 5 MG PO TB24
5.0000 mg | ORAL_TABLET | Freq: Every day | ORAL | Status: DC
  Administered 2013-04-25 – 2013-04-26 (×2): 5 mg via ORAL
  Filled 2013-04-24 (×3): qty 1

## 2013-04-24 MED ORDER — ENOXAPARIN SODIUM 40 MG/0.4ML ~~LOC~~ SOLN
40.0000 mg | SUBCUTANEOUS | Status: DC
  Administered 2013-04-24 – 2013-04-25 (×2): 40 mg via SUBCUTANEOUS
  Filled 2013-04-24 (×3): qty 0.4

## 2013-04-24 MED ORDER — IRBESARTAN 300 MG PO TABS
300.0000 mg | ORAL_TABLET | Freq: Every day | ORAL | Status: DC
  Administered 2013-04-25 – 2013-04-26 (×2): 300 mg via ORAL
  Filled 2013-04-24 (×2): qty 1

## 2013-04-24 MED ORDER — SENNOSIDES-DOCUSATE SODIUM 8.6-50 MG PO TABS: 1.0000 | ORAL_TABLET | Freq: Every evening | ORAL | Status: DC | PRN

## 2013-04-24 MED ORDER — HYDRALAZINE HCL 20 MG/ML IJ SOLN: 10.0000 mg | INTRAMUSCULAR | Status: DC | PRN

## 2013-04-24 MED ORDER — INSULIN ASPART 100 UNIT/ML ~~LOC~~ SOLN
0.0000 [IU] | Freq: Three times a day (TID) | SUBCUTANEOUS | Status: DC
  Administered 2013-04-26: 1 [IU] via SUBCUTANEOUS

## 2013-04-24 MED ORDER — TICLOPIDINE HCL 250 MG PO TABS
250.0000 mg | ORAL_TABLET | Freq: Two times a day (BID) | ORAL | Status: DC
  Administered 2013-04-24 – 2013-04-26 (×4): 250 mg via ORAL
  Filled 2013-04-24 (×6): qty 1

## 2013-04-24 NOTE — ED Notes (Signed)
Report attempted 

## 2013-04-24 NOTE — H&P (Signed)
[facility] History and Physical  Margaret Vazquez [id number] DOB: [birth year] DOA: 04/24/2013  Referring physician: ER physician. PCP: Loura Pardon, MD  Specialists: Dr. Jens Som. Cardiologist.  Chief Complaint: Right upper extremity weakness and heaviness.  HPI: Margaret Vazquez is a 77 y.o. female with history of stroke this on August 2014, presented to the ER because of persistent weakness of the right upper extremity and heaviness since last night. Patient otherwise does not have any weakness of the left upper extremity or both lower extremities. Denies any blurred vision, difficulty speaking or swallowing. In the ER CT head did not show anything acute. Patient has been admitted for further management. Neurologist on-call has been ordered consulted. Patient denies any chest pain or shortness of breath nausea vomiting or abdominal pain.   Review of Systems: As presented in the history of presenting illness, rest negative.  Past Medical History  Diagnosis Date  . Hypertension   . Diabetes mellitus     type II  . Hyperlipidemia     myalgias with Lipitor and Zetia  . GERD (gastroesophageal reflux disease)   . Skin cancer     hx of basal cell/ak's  . Colon polyps 2009  . Allergy history, drug     Aspirin  . Cervical stenosis of spine     With neck pain  . CAD (coronary artery disease) 04/08/11    non obst by cath  . Migraine   . TIA (transient ischemic attack)    Past Surgical History  Procedure Laterality Date  . Appendectomy    . Tubal ligation      BTL  . Breast surgery  1992    breast biopsy  . Eye surgery  2005    tear duct surgery  . Nasal sinus surgery  01/2005  . Cardiac catheterization  1993    LHC: normal coronaries.   . Stress cardiolite  11/1999    Normal/ negative  . Cystoscopy w/ decannulation  03/2000    Normal  . Abd [place]  07/2003    Negative  . Tear duct surgery  2005  . Colonoscopy  12/2007    Adenomatous colon polyps  . Cardiac catheterization   04/07/2011    non obst CAD (Dr Burt Knack)  . Tee without cardioversion N/A 02/16/2013    Procedure: TRANSESOPHAGEAL ECHOCARDIOGRAM (TEE);  Surgeon: Josue Hector, MD;  Location: [facility] ENDOSCOPY;  Service: Cardiovascular;  Laterality: N/A;   Social History:  reports that she has never smoked. She has never used smokeless tobacco. She reports that she does not drink alcohol or use illicit drugs. Where does patient live home. Can patient participate in ADLs? Yes.  Allergies  Allergen Reactions  . Bee Venom Hives, Shortness Of Breath and Swelling  . Nabumetone Anaphylaxis  . Amoxicillin-Pot Clavulanate Hives and Swelling    To lips.  . Aspirin Hives  . Atorvastatin Swelling     joint pain/swelling, inc liver tests  . Clopidogrel Bisulfate Hives  . Codeine Nausea And Vomiting  . Ezetimibe Other (See Comments)     fatigue  . Metformin And Related Other (See Comments)    Diarrhea   . Valsartan Other (See Comments)     fatigue    Family History:  Family History  Problem Relation Age of Onset  . Lung cancer Brother   . Colon cancer Neg Hx   . Skin cancer Daughter   . Diabetes Sister   . Diabetes Brother       Prior to  Admission medications   Medication Sig Start Date End Date Taking? Authorizing Provider  carvedilol (COREG) 6.25 MG tablet Take 1 tablet (6.25 mg total) by mouth 2 (two) times daily with a meal. 10/27/12  Yes Abner Greenspan, MD  EPINEPHrine (EPI-PEN) 0.3 mg/0.3 mL DEVI Inject 0.3 mg into the muscle once. 12/20/10  Yes [place], MD  glipiZIDE (GLUCOTROL XL) 5 MG 24 hr tablet Take 1 tablet (5 mg total) by mouth daily. 10/27/12 01/17/14 Yes [place], MD  olmesartan (BENICAR) 40 MG tablet Take 1 tablet (40 mg total) by mouth daily. 10/27/12  Yes Abner Greenspan, MD  ticlopidine (TICLID) 250 MG tablet Take 1 tablet (250 mg total) by mouth 2 (two) times daily with a meal. 02/16/13  Yes Ripudeep Krystal Eaton, MD    Physical Exam: Filed Vitals:   04/24/13 1806 04/24/13 1815 04/24/13  1900 04/24/13 1902  BP: 222/94  187/67 187/67  Pulse:  54 51   Temp:      TempSrc:      Resp:  16 16 20  $ SpO2:  96% 96% 95%     General:  Well-developed and nourished.  Eyes: Anicteric no pallor.  ENT: No discharge from the ears eyes nose mouth.  Neck: No mass felt.  Cardiovascular: S1-S2 heard.  Respiratory: No rhonchi or crepitations.  Abdomen: Soft nontender bowel sounds present.  Skin: No rash.  Musculoskeletal: No edema.  Psychiatric: Appears normal.  Neurologic: Alert awake oriented to time place and person. Moves all extremities.  Labs on Admission:  Basic Metabolic Panel:  Recent Labs Lab 04/24/13 1740 04/24/13 1749  NA 137 138  K 4.6 7.6*  CL 103 108  CO2 24  --   GLUCOSE 121* 122*  BUN 11 15  CREATININE 0.77 0.90  CALCIUM 9.1  --    Liver Function Tests:  Recent Labs Lab 04/24/13 1740  AST 22  ALT 15  ALKPHOS 91  BILITOT 0.2*  PROT 7.4  ALBUMIN 3.6   No results found for this basename: LIPASE, AMYLASE,  in the last 168 hours No results found for this basename: AMMONIA,  in the last 168 hours CBC:  Recent Labs Lab 04/24/13 1740 04/24/13 1749  WBC 5.8  --   NEUTROABS 2.5  --   HGB 13.9 14.3  HCT 38.9 42.0  MCV 92.4  --   PLT 205  --    Cardiac Enzymes:  Recent Labs Lab 04/24/13 1740  TROPONINI <0.30    BNP (last 3 results) No results found for this basename: PROBNP,  in the last 8760 hours CBG:  Recent Labs Lab 04/24/13 1729  GLUCAP 124*    Radiological Exams on Admission: Ct Head (brain) Wo Contrast  04/24/2013   CLINICAL DATA:  Numbness  EXAM: CT HEAD WITHOUT CONTRAST  TECHNIQUE: Contiguous axial images were obtained from the base of the skull through the vertex without intravenous contrast.  COMPARISON:  CT 02/14/2013  FINDINGS: Ventricle size is normal. Negative for acute infarct. Negative for hemorrhage or mass lesion.  Sinusitis with near complete opacification right maxillary sinus and air-fluid level left  maxillary sinus, similar to the prior study. There is mucosal edema in the frontal and ethmoid and sphenoid sinuses. No acute bony changes.  IMPRESSION: No acute intracranial abnormality.  Sinusitis   Electronically Signed   By: Franchot Gallo M.D.   On: 04/24/2013 18:55     Assessment/Plan Principal Problem:   CVA (cerebral infarction) Active Problems:   DIABETES  MELLITUS, TYPE II   HYPERLIPIDEMIA   HYPERTENSION, ESSENTIAL NOS   1. Possible CVA - moderate shows sinus rhythm. Patient has been placed on neurochecks and swallow evaluation. Since patient has loop recorder cardiologist has to be contacted to see if patient can have MRI done of not. Patient is on Tyklid. Further recommendations per neurologist. 2. Diabetes mellitus type 2 - continue present medications. 3. Hypertension - continue present medications and will allow for permissive hypertension due to stroke for next 24 hours. 4. Hyperlipidemia - continue present medications.    Code Status: Full code.  Family Communication: Husband at the bedside.  Disposition Plan: Admit to inpatient.    Brodan Grewell N. [facility] Pager [phone].  If 7PM-7AM, please contact night-coverage [email or web address] Password [email or web address] 04/24/2013, 8:29 PM

## 2013-04-24 NOTE — ED Provider Notes (Signed)
Medical screening examination/treatment/procedure(s) were conducted as a shared visit with non-physician practitioner(s) and myself.  I personally evaluated the patient during the encounter Pt with right arm weakness, hx of CVA, no other neuro deficits.  No a candidate for tPA.  Neuro has seen, hospitalist will admit  Rolan Bucco, MD 04/24/13 2308

## 2013-04-24 NOTE — Consult Note (Addendum)
Neurology Consultation Reason for Consult: Right arm weakness Referring Physician: Roxine Caddy.  CC: Right arm weakness  History is obtained from: Patient, husband  HPI: Margaret Vazquez is a 77 y.o. female with a history of severe left MCA stenosis who presents with worsening right arm weakness over the past 24 hours. She is currently taking Ticlid secondary to an aspirin allergy and previous feeling of lethargy while taking Plavix.  She had a multifocal stroke 2 months ago in the distribution of the left MCA but had improved to the point of not having any deficits. Then yesterday evening she had a transient episode of "tightening" that lasted 1-2 seconds and then since has had weakness that has progressively gotten worse in her right arm.   LKW: 10/18 tpa given: no, outside of window    ROS: A 14 point ROS was performed and is negative except as noted in the HPI.  Past Medical History  Diagnosis Date  . Hypertension   . Diabetes mellitus     type II  . Hyperlipidemia     myalgias with Lipitor and Zetia  . GERD (gastroesophageal reflux disease)   . Skin cancer     hx of basal cell/ak's  . Colon polyps 2009  . Allergy history, drug     Aspirin  . Cervical stenosis of spine     With neck pain  . CAD (coronary artery disease) 04/08/11    non obst by cath  . Migraine   . TIA (transient ischemic attack)     Family History: Father-TIA  Social History: Tob: Denies  Exam: Current vital signs: BP 187/67  Pulse 51  Temp(Src) 98.6 F (37 C) (Oral)  Resp 20  SpO2 95% Vital signs in last 24 hours: Temp:  [97.3 F (36.3 C)-98.6 F (37 C)] 98.6 F (37 C) (10/19 1754) Pulse Rate:  [51-66] 51 (10/19 1900) Resp:  [16-20] 20 (10/19 1902) BP: (163-230)/(67-94) 187/67 mmHg (10/19 1902) SpO2:  [95 %-98 %] 95 % (10/19 1902)  General: In bed, NAD CV: Regular rate and rhythm Mental Status: Patient is awake, alert, oriented to person, place, month, year, and situation. Immediate  and remote memory are intact. Patient is able to give a clear and coherent history. No signs of aphasia or neglect Cranial Nerves: II: Visual Fields are full. Pupils are equal, round, and reactive to light.  Discs are without papilledema. III,IV, VI: EOMI without ptosis or diploplia.  V: Facial sensation is symmetric to temperature VII: Facial movement is symmetric.  VIII: hearing is intact to voice X: Uvula elevates symmetrically XI: Shoulder shrug is symmetric. XII: tongue is midline without atrophy or fasciculations.  Motor: Tone is normal. Bulk is normal. 5/5 strength was present in left arm, bilateral legs. Her right arm has a very mild drift, and mild weakness with triceps extension. Sensory: Sensation is symmetric to light touch and temperature in the arms and legs. Deep Tendon Reflexes: 2+ and symmetric in the biceps and patellae.  Plantars: Toes are downgoing bilaterally.  Cerebellar: FNF intact bilaterally Gait: Not assessed due to multiple medical monitors in ED setting.  I have reviewed labs in epic and the results pertinent to this consultation are: CMP-unremarkable CBC-unremarkable  I have reviewed the images obtained: CT head-negative  Impression: 77 year old female with likely recurrent stroke in the distribution of a tight MCA stenosis. Given that she has had difficulties with multiple antiplatelet agents, I would favor confirmation of recurrent stroke prior to changing her from Ticlid which  she is currently tolerating well.  She has an implanted loop recorder placed by her cardiologist to look for A. fib. Will need to confirm that an MRI could be done with this in place prior to performing this study.   Recommendations: 1. HgbA1c, fasting lipid panel 2. MRI, MRA  of the brain without contrast if safe to do with implanted loop recorder. 3. Frequent neuro checks 4. Prophylactic therapy-ticlid 5. Risk factor modification 6. Telemetry monitoring 7. PT consult,  OT consult, Speech consult 8. permissive hypertension of 220/110 unless there are other factors which would require a lower pressure   Roland Rack, MD [facility] [phone]  If 7pm- 7am, please page neurology on call at [phone].

## 2013-04-24 NOTE — Progress Notes (Signed)
Patient arrived to unit at 2110. Ambulated to bathroom with stand by assist. Telemetry applied and Central monitoring notified. Patient alert and oriented. Decreased grip and strength in right hand noted, arm also has drift. Pass swallow test, placed on diabetic diet. Will continue to monitor patient. Olevia Perches

## 2013-04-24 NOTE — ED Notes (Signed)
Dr. Kirkpatrick at bedside 

## 2013-04-24 NOTE — ED Notes (Addendum)
Pt reports having weakness to right hand that started last night. Reports it felt "weird" this am when she woke up and has become progressively worse and arm feels heavy. Grip is weaker on right and arm drift noted. bp 230/85, reports taking her bp meds this am.

## 2013-04-24 NOTE — ED Provider Notes (Signed)
CSN: [id number]     Arrival date & time 04/24/13  1711 History   First MD Initiated Contact with Patient 04/24/13 1717     Chief Complaint  Patient presents with  . Numbness   (Consider location/radiation/quality/duration/timing/severity/associated sxs/prior Treatment) HPI  Patient presents to the emergency department brought in by ambulance with complaints of right arm numbness, pain, heaviness. The symptoms started last night where she described her right hand contracting. She then developed some pain in her right arm that lasted throughout the night. This morning she noticed that her arm felt heavy when she woke up and she was unable to grip things she normally could with the right hand. Throughout the day the symptoms have gotten more severe. Within the last few hours her husband determined that they should come to the emergency department to be evaluated. She feels as though her grip is still weaker although it is getting better. She also feels that her arm is still heavy she can move it better. Denies chest pain, SOB, neck pain, difficulty walking, change in vision, difficulty thinking or speaking. She has a hx of TIA and remembered that when she had these symptoms they resolved on their own without any lasting deficits. BUt When theses symptoms continued on for more than 12 hours and were getting worse she decided to come get checked out.  Blood pressure measured at 230/85 initially in triage.  Past Medical History  Diagnosis Date  . Hypertension   . Diabetes mellitus     type II  . Hyperlipidemia     myalgias with Lipitor and Zetia  . GERD (gastroesophageal reflux disease)   . Skin cancer     hx of basal cell/ak's  . Colon polyps 2009  . Allergy history, drug     Aspirin  . Cervical stenosis of spine     With neck pain  . CAD (coronary artery disease) 04/08/11    non obst by cath  . Migraine   . TIA (transient ischemic attack)    Past Surgical History  Procedure Laterality  Date  . Appendectomy    . Tubal ligation      BTL  . Breast surgery  1992    breast biopsy  . Eye surgery  2005    tear duct surgery  . Nasal sinus surgery  01/2005  . Cardiac catheterization  1993    LHC: normal coronaries.   . Stress cardiolite  11/1999    Normal/ negative  . Cystoscopy w/ decannulation  03/2000    Normal  . Abd [place]  07/2003    Negative  . Tear duct surgery  2005  . Colonoscopy  12/2007    Adenomatous colon polyps  . Cardiac catheterization  04/07/2011    non obst CAD (Dr Excell Seltzer)  . Tee without cardioversion N/A 02/16/2013    Procedure: TRANSESOPHAGEAL ECHOCARDIOGRAM (TEE);  Surgeon: Wendall Stade, MD;  Location: [facility] ENDOSCOPY;  Service: Cardiovascular;  Laterality: N/A;   Family History  Problem Relation Age of Onset  . Lung cancer Brother   . Colon cancer Neg Hx   . Skin cancer Daughter   . Diabetes Sister   . Diabetes Brother    History  Substance Use Topics  . Smoking status: Never Smoker   . Smokeless tobacco: Never Used  . Alcohol Use: No   OB History   Grav Para Term Preterm Abortions TAB SAB Ect Mult Living  Review of Systems  Review of Systems  Gen: no weight loss, fevers, chills, night sweats  Eyes: no discharge or drainage, no occular pain or visual changes  Nose: no epistaxis or rhinorrhea  Mouth: no dental pain, no sore throat  Neck: no neck pain  Lungs:No wheezing, coughing or hemoptysis CV: no chest pain, palpitations, dependent edema or orthopnea  Abd: no abdominal pain, nausea, vomiting  GU: no dysuria or gross hematuria  MSK:  No abnormalities  Neuro: no headache, + focal neurologic deficits  Skin: no abnormalities Psyche: negative.   Allergies  Bee venom; Nabumetone; Amoxicillin-pot clavulanate; Aspirin; Atorvastatin; Clopidogrel bisulfate; Codeine; Ezetimibe; Metformin and related; and Valsartan  Home Medications   Current Outpatient Rx  Name  Route  Sig  Dispense  Refill  . carvedilol (COREG) 6.25  MG tablet   Oral   Take 1 tablet (6.25 mg total) by mouth 2 (two) times daily with a meal.   60 tablet   11   . EPINEPHrine (EPI-PEN) 0.3 mg/0.3 mL DEVI   Intramuscular   Inject 0.3 mg into the muscle once.         Marland Kitchen glipiZIDE (GLUCOTROL XL) 5 MG 24 hr tablet   Oral   Take 1 tablet (5 mg total) by mouth daily.   30 tablet   11   . olmesartan (BENICAR) 40 MG tablet   Oral   Take 1 tablet (40 mg total) by mouth daily.   30 tablet   11   . ticlopidine (TICLID) 250 MG tablet   Oral   Take 1 tablet (250 mg total) by mouth 2 (two) times daily with a meal.   60 tablet   4    BP 187/67  Pulse 54  Temp(Src) 98.6 F (37 C) (Oral)  Resp 20  SpO2 95% Physical Exam  Nursing note and vitals reviewed. Constitutional: She is oriented to person, place, and time. She appears well-developed and well-nourished. No distress.  HENT:  Head: Normocephalic and atraumatic.  Eyes: Pupils are equal, round, and reactive to light.  Neck: Normal range of motion. Neck supple.  Cardiovascular: Normal rate and regular rhythm.   Pulmonary/Chest: Effort normal.  Abdominal: Soft.  Neurological: She is alert and oriented to person, place, and time. No cranial nerve deficit or sensory deficit.  Patient is ambulatory. She is able to hold her arms up over her head and pulled it better for 30 seconds before right arm starts to slowly fall down part way. He is able to do rapid alternating movements with sufficient accuracy. She has decreased grip strength in her right hand.  Skin: Skin is warm and dry.    ED Course  Procedures (including critical care time) Labs Review Labs Reviewed  COMPREHENSIVE METABOLIC PANEL - Abnormal; Notable for the following:    Glucose, Bld 121 (*)    Total Bilirubin 0.2 (*)    GFR calc non Af Amer 80 (*)    All other components within normal limits  GLUCOSE, CAPILLARY - Abnormal; Notable for the following:    Glucose-Capillary 124 (*)    All other components within  normal limits  POCT I-STAT, CHEM 8 - Abnormal; Notable for the following:    Potassium 7.6 (*)    Glucose, Bld 122 (*)    Calcium, Ion 1.09 (*)    All other components within normal limits  PROTIME-INR  APTT  CBC  DIFFERENTIAL  TROPONIN I  POCT I-STAT TROPONIN I   Imaging Review Ct Head (brain)  Wo Contrast  04/24/2013   CLINICAL DATA:  Numbness  EXAM: CT HEAD WITHOUT CONTRAST  TECHNIQUE: Contiguous axial images were obtained from the base of the skull through the vertex without intravenous contrast.  COMPARISON:  CT 02/14/2013  FINDINGS: Ventricle size is normal. Negative for acute infarct. Negative for hemorrhage or mass lesion.  Sinusitis with near complete opacification right maxillary sinus and air-fluid level left maxillary sinus, similar to the prior study. There is mucosal edema in the frontal and ethmoid and sphenoid sinuses. No acute bony changes.  IMPRESSION: No acute intracranial abnormality.  Sinusitis   Electronically Signed   By: Marlan Palau M.D.   On: 04/24/2013 18:55    EKG Interpretation     Ventricular Rate:  60 PR Interval:  164 QRS Duration: 92 QT Interval:  426 QTC Calculation: 426 R Axis:   44 Text Interpretation:  Sinus rhythm since last tracing no significant change            MDM   1. Stroke     Dr. Fredderick Phenix saw patient as well. Patient still has symptoms, although very mild. Dr. Petra Kuba with neurology will consult on patient and in the   Pt admission for TIA r/o stroke.  [facility], [facility], inpatient, Tele for admission.      Dorthula Matas, PA-C 04/24/13 1926

## 2013-04-25 ENCOUNTER — Inpatient Hospital Stay (HOSPITAL_COMMUNITY): Payer: Medicare Other

## 2013-04-25 DIAGNOSIS — K219 Gastro-esophageal reflux disease without esophagitis: Secondary | ICD-10-CM

## 2013-04-25 DIAGNOSIS — I517 Cardiomegaly: Secondary | ICD-10-CM

## 2013-04-25 DIAGNOSIS — R209 Unspecified disturbances of skin sensation: Secondary | ICD-10-CM

## 2013-04-25 LAB — CBC WITH DIFFERENTIAL/PLATELET
Basophils Absolute: 0 10*3/uL (ref 0.0–0.1)
Basophils Relative: 1 % (ref 0–1)
Eosinophils Absolute: 0.3 10*3/uL (ref 0.0–0.7)
Eosinophils Relative: 5 % (ref 0–5)
HCT: 36.4 % (ref 36.0–46.0)
Hemoglobin: 12.3 g/dL (ref 12.0–15.0)
Lymphocytes Relative: 47 % — ABNORMAL HIGH (ref 12–46)
Lymphs Abs: 2.7 10*3/uL (ref 0.7–4.0)
MCH: 31.3 pg (ref 26.0–34.0)
MCHC: 33.8 g/dL (ref 30.0–36.0)
MCV: 92.6 fL (ref 78.0–100.0)
Monocytes Absolute: 0.4 10*3/uL (ref 0.1–1.0)
Monocytes Relative: 8 % (ref 3–12)
Neutro Abs: 2.3 10*3/uL (ref 1.7–7.7)
Neutrophils Relative %: 40 % — ABNORMAL LOW (ref 43–77)
Platelets: 176 10*3/uL (ref 150–400)
RBC: 3.93 MIL/uL (ref 3.87–5.11)
RDW: 12.7 % (ref 11.5–15.5)
WBC: 5.8 10*3/uL (ref 4.0–10.5)

## 2013-04-25 LAB — TSH: TSH: 4.118 u[IU]/mL (ref 0.350–4.500)

## 2013-04-25 LAB — COMPREHENSIVE METABOLIC PANEL
ALT: 11 U/L (ref 0–35)
AST: 12 U/L (ref 0–37)
Albumin: 3.1 g/dL — ABNORMAL LOW (ref 3.5–5.2)
Alkaline Phosphatase: 79 U/L (ref 39–117)
BUN: 10 mg/dL (ref 6–23)
CO2: 27 mEq/L (ref 19–32)
Calcium: 8.5 mg/dL (ref 8.4–10.5)
Chloride: 104 mEq/L (ref 96–112)
Creatinine, Ser: 0.89 mg/dL (ref 0.50–1.10)
GFR calc Af Amer: 72 mL/min — ABNORMAL LOW (ref >90)
GFR calc non Af Amer: 62 mL/min — ABNORMAL LOW (ref >90)
Glucose, Bld: 110 mg/dL — ABNORMAL HIGH (ref 70–99)
Potassium: 4 mEq/L (ref 3.5–5.1)
Sodium: 140 mEq/L (ref 135–145)
Total Bilirubin: 0.2 mg/dL — ABNORMAL LOW (ref 0.3–1.2)
Total Protein: 6.3 g/dL (ref 6.0–8.3)

## 2013-04-25 LAB — LIPID PANEL
Cholesterol: 184 mg/dL (ref 0–200)
HDL: 45 mg/dL (ref >39)
LDL Cholesterol: 95 mg/dL (ref 0–99)
Total CHOL/HDL Ratio: 4.1 RATIO
Triglycerides: 222 mg/dL — ABNORMAL HIGH (ref <150)
VLDL: 44 mg/dL — ABNORMAL HIGH (ref 0–40)

## 2013-04-25 LAB — GLUCOSE, CAPILLARY
Glucose-Capillary: 109 mg/dL — ABNORMAL HIGH (ref 70–99)
Glucose-Capillary: 105 mg/dL — ABNORMAL HIGH (ref 70–99)
Glucose-Capillary: 83 mg/dL (ref 70–99)
Glucose-Capillary: 159 mg/dL — ABNORMAL HIGH (ref 70–99)

## 2013-04-25 LAB — HEMOGLOBIN A1C
Hgb A1c MFr Bld: 6.7 % — ABNORMAL HIGH (ref <5.7)
Mean Plasma Glucose: 146 mg/dL — ABNORMAL HIGH (ref <117)

## 2013-04-25 MED ORDER — INFLUENZA VAC SPLIT QUAD 0.5 ML IM SUSP
0.5000 mL | INTRAMUSCULAR | Status: DC
Start: 2013-04-26 — End: 2013-04-26
  Filled 2013-04-25: qty 0.5

## 2013-04-25 MED ORDER — ACETAMINOPHEN 325 MG PO TABS
650.0000 mg | ORAL_TABLET | Freq: Four times a day (QID) | ORAL | Status: DC | PRN
  Administered 2013-04-25: 650 mg via ORAL
  Filled 2013-04-25: qty 2

## 2013-04-25 MED ORDER — GADOBENATE DIMEGLUMINE 529 MG/ML IV SOLN
20.0000 mL | Freq: Once | INTRAVENOUS | Status: AC
  Administered 2013-04-25: 20 mL via INTRAVENOUS

## 2013-04-25 NOTE — Progress Notes (Signed)
Received a call from MRI concerning pt MRI and her implanted loop recorder and needed clarification from MD. PA L. Brown notified and given the go ahead for MRI. MRI notified. Rodell Perna Omarie Parcell RN

## 2013-04-25 NOTE — Progress Notes (Signed)
*  PRELIMINARY RESULTS* Vascular Ultrasound Carotid Duplex (Doppler) has been completed. Findings suggest 1-39% internal carotid artery stenosis. Vertebral arteries are patent with antegrade flow.  04/25/2013 2:19 PM Gertie Fey, RVT, RDCS, RDMS

## 2013-04-25 NOTE — Progress Notes (Signed)
EEG Completed; Results Pending  

## 2013-04-25 NOTE — Progress Notes (Signed)
  Echocardiogram 2D Echocardiogram has been performed.  Margaret Vazquez 04/25/2013, 9:52 AM

## 2013-04-25 NOTE — Evaluation (Signed)
Occupational Therapy Evaluation Patient Details Name: Margaret Vazquez MRN: [id number] DOB: 23-Nov-1935 Today's Date: 04/25/2013 Time: [id number] OT Time Calculation (min): 21 min  OT Assessment / Plan / Recommendation History of present illness Margaret Vazquez is a 77 y.o. female with history of stroke this on August 2014, presented to the ER because of persistent weakness of the right upper extremity and heaviness since last night. Patient otherwise does not have any weakness of the left upper extremity or both lower extremities. Denies any blurred vision, difficulty speaking or swallowing. In the ER CT head did not show anything acute. Patient has been admitted for further management.   Clinical Impression   Pt presents with below problem list. Pt moving well during session. Pt will benefit from acute OT to increase independence prior to d/c. Pt independent with ADLs, PTA. OT attempted to see pt second time today but she was tearful and did not want to participate in further vision assessment. Pt reported she has been having new onset of blurry vision and OT recommended her to tell her neurologist about this.     OT Assessment  Patient needs continued OT Services    Follow Up Recommendations  No OT follow up    Barriers to Discharge      Equipment Recommendations  None recommended by OT    Recommendations for Other Services    Frequency  Min 2X/week    Precautions / Restrictions Restrictions Weight Bearing Restrictions: No   Pertinent Vitals/Pain No pain reported.     ADL  Eating/Feeding: Set up Where Assessed - Eating/Feeding: Edge of bed Grooming: Set up Where Assessed - Grooming: Unsupported sitting Upper Body Dressing: Minimal assistance Where Assessed - Upper Body Dressing: Unsupported sitting Lower Body Dressing: Minimal assistance Where Assessed - Lower Body Dressing: Unsupported sit to stand Toilet Transfer: Independent Toilet Transfer Method: Sit to stand Toilet  Transfer Equipment: Regular height toilet Tub/Shower Transfer: Min guard Tub/Shower Transfer Method: Therapist, art: Other (comment) (getting down in tub ) Transfers/Ambulation Related to ADLs: Mod I with ambulation and Independent with transfers. Min guard for tub transfer. ADL Comments: Educated on decreasing sodium intake and avoiding canned foods as it increases risk for stroke. Pt practiced tub transfer and got down in tub as she does at home, but it was difficult and she stated her tub is bigger. Pt plopped in tub when going to sit. OT recommended sitting on shower chair for bathing for safety (she has shower chair at home). Gave pt handout on fine motor coordination activities and gave her theraband to help increase strength in upper extremities. Also, educated on chair push ups.  Gave pt squeeze ball as well to increase grip strength. Pt reports difficulty opening packages.     OT Diagnosis: Generalized weakness  OT Problem List: Decreased strength;Decreased knowledge of use of DME or AE;Decreased coordination;Decreased knowledge of precautions;Impaired UE functional use OT Treatment Interventions: Therapeutic exercise;Patient/family education;Visual/perceptual remediation/compensation;Therapeutic activities;Self-care/ADL training;DME and/or AE instruction   OT Goals(Current goals can be found in the care plan section) Acute Rehab OT Goals Patient Stated Goal: not stated OT Goal Formulation: With patient Time For Goal Achievement: 05/02/13 Potential to Achieve Goals: Good ADL Goals Pt Will Perform Upper Body Dressing: with modified independence;sitting Pt Will Perform Lower Body Dressing: with modified independence;sit to/from stand Additional ADL Goal #1: Pt will independently perform bilateral upper extremity exercises to increase strength.  Additional ADL Goal #2: Pt will independently perform fine motor coordination activities on handout  provided to increase  coordination in right hand.   Visit Information  Last OT Received On: 04/25/13 Assistance Needed: +1 History of Present Illness: Margaret Vazquez is a 77 y.o. female with history of stroke this on August 2014, presented to the ER because of persistent weakness of the right upper extremity and heaviness since last night. Patient otherwise does not have any weakness of the left upper extremity or both lower extremities. Denies any blurred vision, difficulty speaking or swallowing. In the ER CT head did not show anything acute. Patient has been admitted for further management.       Prior [place] expects to be discharged to:: Private residence Living Arrangements: Spouse/significant other Available Help at Discharge: Family Type of Home: House Home Access: Stairs to enter [employer] of Steps: 4 Entrance Stairs-Rails: Left Home Layout: Two level;Able to live on main level with bedroom/bathroom;Laundry or work area in basement Alternate Therapist, sports of Steps: flight Alternate Level Stairs-Rails: Left Home Equipment: Environmental consultant - 2 wheels;Bedside commode;Tub bench;Shower seat  Lives With: Spouse Prior Function Level of Independence: Independent Communication Communication: No difficulties Dominant Hand: Right         Vision/Perception Vision - History Baseline Vision: Wears glasses only for reading Patient Visual Report: Blurring of vision Vision - Assessment Vision Assessment: Vision tested Tracking/Visual Pursuits: Able to track stimulus in all quads without difficulty Visual Fields: No apparent deficits   Cognition  Cognition Arousal/Alertness: Awake/alert Behavior During Therapy: WFL for tasks assessed/performed Overall Cognitive Status: Within Functional Limits for tasks assessed    Extremity/Trunk Assessment Upper Extremity Assessment Upper Extremity Assessment: Generalized weakness;RUE deficits/detail RUE Deficits /  Details: decreased grip strength on Rt hand. weakness in both upper extremities with shoulder flexion; Rt arm weaker in biceps RUE Coordination: decreased fine motor;decreased gross motor Lower Extremity Assessment Lower Extremity Assessment: Defer to PT evaluation     Mobility Bed Mobility Bed Mobility: Supine to Sit;Sit to Supine Supine to Sit: 7: Independent Sit to Supine: 7: Independent Transfers Transfers: Sit to Stand;Stand to Sit Sit to Stand: 7: Independent;From bed;From toilet Stand to Sit: 7: Independent;To bed;To toilet     Exercise     Balance     End of Session OT - End of Session Activity Tolerance: Patient tolerated treatment well Patient left: in bed;with call bell/phone within reach  [place], [place] OTR/L [id number] 04/25/2013, 6:16 PM

## 2013-04-25 NOTE — Evaluation (Signed)
Speech Language Pathology Evaluation Patient Details Name: Margaret Vazquez MRN: [id number] DOB: February 13, 1936 Today's Date: 04/25/2013 Time: 9629-5284 SLP Time Calculation (min): 16 min  Problem List:  Patient Active Problem List   Diagnosis Date Noted  . CVA (cerebral infarction) 04/24/2013  . Acute ischemic stroke 02/15/2013  . Numbness and tingling of right arm 02/14/2013  . Encounter for Medicare annual wellness exam 10/27/2012  . UTI (urinary tract infection) 10/20/2012  . Tick bite 09/29/2012  . Encopresis 12/15/2011  . Post-menopausal 10/28/2011  . Abnormal urine odor 07/30/2011  . Diarrhea 07/30/2011  . PERSONAL HX COLONIC POLYPS 09/04/2009  . VITAMIN B12 DEFICIENCY 03/06/2009  . UNSPECIFIED ANEMIA 02/13/2009  . ALLERGIC RHINITIS 02/14/2008  . BACK PAIN, LUMBAR 11/03/2007  . HYPERLIPIDEMIA 11/24/2006  . HYPERTENSION, ESSENTIAL NOS 11/24/2006  . DIABETES MELLITUS, TYPE II 10/09/2006  . GERD 10/09/2006  . OVERACTIVE BLADDER 10/09/2006  . INCONTINENCE, URGE 10/09/2006  . SKIN CANCER, HX OF 10/09/2006   Past Medical History:  Past Medical History  Diagnosis Date  . Hypertension   . Diabetes mellitus     type II  . Hyperlipidemia     myalgias with Lipitor and Zetia  . GERD (gastroesophageal reflux disease)   . Skin cancer     hx of basal cell/ak's  . Colon polyps 2009  . Allergy history, drug     Aspirin  . Cervical stenosis of spine     With neck pain  . CAD (coronary artery disease) 04/08/11    non obst by cath  . Migraine   . TIA (transient ischemic attack)    Past Surgical History:  Past Surgical History  Procedure Laterality Date  . Appendectomy    . Tubal ligation      BTL  . Breast surgery  1992    breast biopsy  . Eye surgery  2005    tear duct surgery  . Nasal sinus surgery  01/2005  . Cardiac catheterization  1993    LHC: normal coronaries.   . Stress cardiolite  11/1999    Normal/ negative  . Cystoscopy w/ decannulation  03/2000    Normal   . Abd [place]  07/2003    Negative  . Tear duct surgery  2005  . Colonoscopy  12/2007    Adenomatous colon polyps  . Cardiac catheterization  04/07/2011    non obst CAD (Dr Excell Seltzer)  . Tee without cardioversion N/A 02/16/2013    Procedure: TRANSESOPHAGEAL ECHOCARDIOGRAM (TEE);  Surgeon: Wendall Stade, MD;  Location: [facility] ENDOSCOPY;  Service: Cardiovascular;  Laterality: N/A;   HPI:  77 year old female with likely recurrent stroke in the distribution of a tight MCA stenosis.    Assessment / Plan / Recommendation Clinical Impression  Pt demonstrates cognitive linguistic function at baseline. She reported significant difficulty remembering names her whole life at beginning of assessment, which was consistent with exam. No difficutly word finding except with proper names. Pt does not feel this is of any concern. She does ask many questions about stroke prevention, will defer questions to stroke team. No SLP f/u needed at this time.     SLP Assessment  Patient does not need any further Speech Lanaguage Pathology Services    Follow Up Recommendations       Frequency and Duration        Pertinent Vitals/Pain NA   SLP Goals     SLP Evaluation Prior Functioning  Cognitive/Linguistic Baseline: Within functional limits Type of Home: House  Lives With:  Spouse Available Help at Discharge: Family Vocation: Retired   IT consultant  Overall Cognitive Status: Within Functional Limits for tasks assessed Orientation Level: Oriented X4    Comprehension  Auditory Comprehension Overall Auditory Comprehension: Appears within functional limits for tasks assessed    Expression Verbal Expression Overall Verbal Expression: Impaired at baseline Initiation: No impairment Automatic Speech: Name;Social Response Level of Generative/Spontaneous Verbalization: Conversation Repetition: No impairment Naming: Impairment Responsive: 76-100% accurate Confrontation: Impaired (with  proper names only, baseline  per pt. ) Convergent: 75-100% accurate Divergent: 75-100% accurate Effective Techniques: Phonemic cues Written Expression Dominant Hand: Right Written Expression: Not tested   Oral / Motor Oral Motor/Sensory Function Overall Oral Motor/Sensory Function: Appears within functional limits for tasks assessed Motor Speech Overall Motor Speech: Appears within functional limits for tasks assessed   GO    Harlon Ditty, MA CCC-SLP [phone]  Claudine Mouton 04/25/2013, 10:49 AM

## 2013-04-25 NOTE — Progress Notes (Signed)
Nutrition Brief Note  Patient identified on the Malnutrition Screening Tool (MST) Report  Wt Readings from Last 15 Encounters:  04/24/13 207 lb 3.2 oz (93.985 kg)  03/01/13 204 lb 8 oz (92.761 kg)  02/14/13 210 lb 6.4 oz (95.437 kg)  02/14/13 210 lb 6.4 oz (95.437 kg)  02/14/13 210 lb 6.4 oz (95.437 kg)  10/27/12 206 lb 8 oz (93.668 kg)  10/20/12 206 lb (93.441 kg)  09/29/12 207 lb (93.895 kg)  08/09/12 206 lb 8 oz (93.668 kg)  06/14/12 204 lb 12 oz (92.874 kg)  05/04/12 206 lb (93.441 kg)  04/28/12 204 lb 4 oz (92.647 kg)  03/17/12 202 lb (91.627 kg)  03/15/12 202 lb (91.627 kg)  12/29/11 201 lb (91.173 kg)    Body mass index is 30.58 kg/(m^2). Patient meets criteria for Obesity, Class 1 based on current BMI.   Weight has been stable for the last year. Good appetite reported.   Current diet order is Carbohydrate Modified, patient is consuming approximately 100% of meals at this time. Labs and medications reviewed.   No nutrition interventions warranted at this time. If nutrition issues arise, please consult RD.   Linnell Fulling, RD, LDN Pager #: [phone] After-Hours Pager #: [phone]

## 2013-04-25 NOTE — Progress Notes (Signed)
[facility] PROGRESS NOTE  Margaret Vazquez ZOX:[id number] DOB: July 10, 1935 DOA: 04/24/2013 PCP: Roxy Manns, MD  Assessment/Plan: 1. Possible CVA -  -Neurology currently on board and managing axilla -Plan on continuing the Ticlid for secondary stroke prevention, the patient was reporting aspirin allergy and prior feelings of lethargy while on Plavix -EEG and carotid pending - MRI of the brain pending - Physical therapy has evaluated and recommended no PT followup  2. Diabetes mellitus type 2 - continue present medications. Blood glucose relatively well controlled.  3. Hypertension - continue present medications. Will allow for permissive hypertension given possibility of #1  4. Hyperlipidemia - continue present medications.  Code Status: full Family Communication: discussed with patient and family at bedside. Disposition Plan: Pending further work up and recommendations.   Consultants:  Neurology  Physical therapy  Procedures:  CT of head  Stroke w/u pending see orders for details  Antibiotics:  None  HPI/Subjective: Patient has no new complaints.  Objective: Filed Vitals:   04/25/13 1409  BP: 157/68  Pulse: 57  Temp: 98.2 F (36.8 C)  Resp: 18    Intake/Output Summary (Last 24 hours) at 04/25/13 1442 Last data filed at 04/25/13 0811  Gross per 24 hour  Intake    360 ml  Output      0 ml  Net    360 ml   Filed Weights   04/24/13 2110  Weight: 93.985 kg (207 lb 3.2 oz)    Exam:   General:  Pt in NAD, alert and awake  Cardiovascular: RRR, no MRG  Respiratory: CTA BL, no wheezes  Abdomen: soft, NT, ND  Musculoskeletal: warm and dry   Data Reviewed: Basic Metabolic Panel:  Recent Labs Lab 04/24/13 1740 04/24/13 1749 04/25/13 0616  NA 137 138 140  K 4.6 7.6* 4.0  CL 103 108 104  CO2 24  --  27  GLUCOSE 121* 122* 110*  BUN 11 15 10   CREATININE 0.77 0.90 0.89  CALCIUM 9.1  --  8.5   Liver Function Tests:  Recent Labs Lab  04/24/13 1740 04/25/13 0616  AST 22 12  ALT 15 11  ALKPHOS 91 79  BILITOT 0.2* 0.2*  PROT 7.4 6.3  ALBUMIN 3.6 3.1*   No results found for this basename: LIPASE, AMYLASE,  in the last 168 hours No results found for this basename: AMMONIA,  in the last 168 hours CBC:  Recent Labs Lab 04/24/13 1740 04/24/13 1749 04/25/13 0616  WBC 5.8  --  5.8  NEUTROABS 2.5  --  2.3  HGB 13.9 14.3 12.3  HCT 38.9 42.0 36.4  MCV 92.4  --  92.6  PLT 205  --  176   Cardiac Enzymes:  Recent Labs Lab 04/24/13 1740  TROPONINI <0.30   BNP (last 3 results) No results found for this basename: PROBNP,  in the last 8760 hours CBG:  Recent Labs Lab 04/24/13 1729 04/25/13 0645 04/25/13 1138  GLUCAP 124* 109* 105*    No results found for this or any previous visit (from the past 240 hour(s)).   Studies: Dg Chest 2 View  04/24/2013   CLINICAL DATA:  Stroke  EXAM: CHEST  2 VIEW  COMPARISON:  02/14/2013  FINDINGS: Heart size and vascularity are normal. Cardiac monitoring device is present in the anterior subcutaneous tissues.  Negative for infiltrate effusion or mass. Lungs are clear. Lungs are hyperinflated.  IMPRESSION: No active cardiopulmonary disease.   Electronically Signed   By: Marlan Palau M.D.  On: 04/24/2013 23:24   Ct Head (brain) Wo Contrast  04/24/2013   CLINICAL DATA:  Numbness  EXAM: CT HEAD WITHOUT CONTRAST  TECHNIQUE: Contiguous axial images were obtained from the base of the skull through the vertex without intravenous contrast.  COMPARISON:  CT 02/14/2013  FINDINGS: Ventricle size is normal. Negative for acute infarct. Negative for hemorrhage or mass lesion.  Sinusitis with near complete opacification right maxillary sinus and air-fluid level left maxillary sinus, similar to the prior study. There is mucosal edema in the frontal and ethmoid and sphenoid sinuses. No acute bony changes.  IMPRESSION: No acute intracranial abnormality.  Sinusitis   Electronically Signed   By:  Marlan Palau M.D.   On: 04/24/2013 18:55    Scheduled Meds: . carvedilol  6.25 mg Oral BID WC  . enoxaparin (LOVENOX) injection  40 mg Subcutaneous Q24H  . glipiZIDE  5 mg Oral QAC breakfast  . [START ON 04/26/2013] influenza vac split quadrivalent PF  0.5 mL Intramuscular Tomorrow-1000  . insulin aspart  0-9 Units Subcutaneous TID WC  . irbesartan  300 mg Oral Daily  . ticlopidine  250 mg Oral BID WC   Continuous Infusions: . sodium chloride      Principal Problem:   CVA (cerebral infarction) Active Problems:   DIABETES MELLITUS, TYPE II   HYPERLIPIDEMIA   HYPERTENSION, ESSENTIAL NOS    Time spent: > 35 minutes    Penny Pia  [facility] Pager [phone]. If 7PM-7AM, please contact night-coverage at [email or web address], password [email or web address] 04/25/2013, 2:42 PM  LOS: 1 day

## 2013-04-25 NOTE — Procedures (Signed)
ELECTROENCEPHALOGRAM REPORT   Patient: Margaret Vazquez       Room #: 2X52 EEG No. ID: [id number] Age: 77 y.o.        Sex: female Referring Physician: Cena Benton Report Date:  04/25/2013        Interpreting Physician: Aline Brochure  History: Margaret Vazquez is an 77 y.o. female history of left MCA presented with exacerbation of right-sided weakness. There was a tightening sensation of her right arm at the onset of her symptoms.  Indications for study:  Rule out focal seizure disorder.  Technique: This is an 18 channel routine scalp EEG performed at the bedside with bipolar and monopolar montages arranged in accordance to the international 10/20 system of electrode placement.   Description: CT was recorded during wakefulness. Background activity consists of 9 Hz symmetrical alpha rhythm recorder from the posterior head region as well as low amplitude 20-25 Hz beta activity recorded from the frontal and central regions. Photic stimulation was not performed. Hyperventilation was not performed. No epileptiform discharges were recorded.  Interpretation: This normal EEG recording during wakefulness. No evidence of an epileptic disorder was demonstrated.   Margaret Vazquez M.D. [facility] [phone]

## 2013-04-25 NOTE — Evaluation (Signed)
Physical Therapy Evaluation Patient Details Name: Margaret Vazquez MRN: [id number] DOB: 1935/12/02 Today's Date: 04/25/2013 Time: [place]:[id number] PT Time Calculation (min): 25 min  PT Assessment / Plan / Recommendation History of Present Illness    Margaret Vazquez is a 77 y.o. female with history of stroke this on August 2014, presented to the ER because of persistent weakness of the right upper extremity and heaviness since last night. Patient otherwise does not have any weakness of the left upper extremity or both lower extremities. Denies any blurred vision, difficulty speaking or swallowing. In the ER CT head did not show anything acute. Patient has been admitted for further management.   Clinical Impression  Patient at baseline for mobility. Educated patient and daughter extensively on TIA, Stroke awareness. Educated on "BE FAST" and encouraged to share information with family and friends who may spend time with the patient. Patient and daughter appreciative. No further acute needs. Will sign off.    PT Assessment  Patent does not need any further PT services    Follow Up Recommendations  No PT follow up                   Precautions / Restrictions     Pertinent Vitals/Pain No pain reported      Mobility  Bed Mobility Bed Mobility: Supine to Sit;Sitting - Scoot to Edge of Bed;Sit to Supine Supine to Sit: 7: Independent Sitting - Scoot to Edge of Bed: 7: Independent Sit to Supine: 7: Independent Transfers Transfers: Sit to Stand;Stand to Sit Sit to Stand: 7: Independent Stand to Sit: 7: Independent Ambulation/Gait Ambulation/Gait Assistance: 7: Independent Ambulation Distance (Feet): 400 Feet Assistive device: None Ambulation/Gait Assistance Details: steady no assist Gait Pattern: Within Functional Limits Stairs: Yes Stairs Assistance: 6: Modified independent (Device/Increase time) Stair Management Technique: One rail Right;Alternating pattern;Forwards Number of Stairs:  5 Modified Rankin (Stroke Patients Only) Pre-Morbid Rankin Score: Slight disability Modified Rankin: Slight disability     PT Goals(Current goals can be found in the care plan section) Acute Rehab PT Goals PT Goal Formulation: No goals set, d/c therapy Time For Goal Achievement: 05/09/13  Visit Information  Last PT Received On: 04/25/13 Assistance Needed: +1       Prior [place] expects to be discharged to:: Private residence Living Arrangements: Spouse/significant other Available Help at Discharge: Family Type of Home: House Home Access: Stairs to enter Technical brewer of Steps: 4 Entrance Stairs-Rails: Left Home Layout: Two level;Able to live on main level with bedroom/bathroom;Laundry or work area in basement Alternate Therapist, sports of Steps: flight Alternate Level Stairs-Rails: Left Home Equipment: Environmental consultant - 2 wheels;Bedside commode Prior Function Level of Independence: Independent Communication Communication: No difficulties Dominant Hand: Right    Cognition  Cognition Overall Cognitive Status: Within Functional Limits for tasks assessed    Extremity/Trunk Assessment Upper Extremity Assessment Upper Extremity Assessment: Defer to OT evaluation Lower Extremity Assessment Lower Extremity Assessment: Overall WFL for tasks assessed   Balance Balance Balance Assessed: Yes Standardized Balance Assessment Standardized Balance Assessment: Dynamic Gait Index Dynamic Gait Index Level Surface: Normal Change in Gait Speed: Normal Gait with Horizontal Head Turns: Mild Impairment Gait with Vertical Head Turns: Normal Gait and Pivot Turn: Normal Step Over Obstacle: Normal Step Around Obstacles: Normal Steps: Mild Impairment Total Score: 22 High Level Balance High Level Balance Activites: Side stepping;Backward walking;Direction changes;Turns;Head turns High Level Balance Comments: steady   End of Session PT - End of  Session Equipment Utilized  During Treatment: Gait belt Activity Tolerance: Patient tolerated treatment well Patient left: in bed;with call bell/phone within reach;with family/visitor present Nurse Communication: Mobility status  GP     Margaret Vazquez 04/25/2013, 2:28 PM Margaret Vazquez, [place] DPT  [phone]

## 2013-04-25 NOTE — Progress Notes (Signed)
Stroke Team Progress Note  HISTORY Margaret Vazquez is a 77 y.o. female with a history of severe left MCA stenosis who presents with worsening right arm weakness over the past 24 hours. She is currently taking Ticlid secondary to an aspirin allergy and previous feeling of lethargy while taking Plavix.   She had a multifocal stroke 2 months ago in the distribution of the left MCA but had improved to the point of not having any deficits. Then yesterday evening she had a transient episode of "tightening" that lasted 1-2 seconds and then since has had weakness that has progressively gotten worse in her right arm.   LKW: 10/18  tpa given: no, outside of window   She was admitted for further evaluation and treatment.  SUBJECTIVE Still with right arm weakness. Family in room.  OBJECTIVE Most recent Vital Signs: Filed Vitals:   04/24/13 2343 04/25/13 0136 04/25/13 0409 04/25/13 0644  BP: 156/60 160/54 139/61 177/56  Pulse: 57 58 64 61  Temp: 98 F (36.7 C) 98.2 F (36.8 C) 98.2 F (36.8 C) 98.1 F (36.7 C)  TempSrc: Oral Oral Oral Oral  Resp: 18 20 20 18   Height:      Weight:      SpO2: 94% 95% 94% 96%   CBG (last 3)   Recent Labs  04/24/13 1729 04/25/13 0645  GLUCAP 124* 109*    IV Fluid Intake:   . sodium chloride      MEDICATIONS  . carvedilol  6.25 mg Oral BID WC  . enoxaparin (LOVENOX) injection  40 mg Subcutaneous Q24H  . glipiZIDE  5 mg Oral QAC breakfast  . insulin aspart  0-9 Units Subcutaneous TID WC  . irbesartan  300 mg Oral Daily  . ticlopidine  250 mg Oral BID WC   PRN:  hydrALAZINE, senna-docusate  Diet:  Carb Control thin liquids Activity:  OOB with assistance DVT Prophylaxis:  lovenox  CLINICALLY SIGNIFICANT STUDIES Basic Metabolic Panel:  Recent Labs Lab 04/24/13 1740 04/24/13 1749 04/25/13 0616  NA 137 138 140  K 4.6 7.6* 4.0  CL 103 108 104  CO2 24  --  27  GLUCOSE 121* 122* 110*  BUN 11 15 10   CREATININE 0.77 0.90 0.89  CALCIUM 9.1  --   8.5   Liver Function Tests:  Recent Labs Lab 04/24/13 1740 04/25/13 0616  AST 22 12  ALT 15 11  ALKPHOS 91 79  BILITOT 0.2* 0.2*  PROT 7.4 6.3  ALBUMIN 3.6 3.1*   CBC:  Recent Labs Lab 04/24/13 1740 04/24/13 1749 04/25/13 0616  WBC 5.8  --  5.8  NEUTROABS 2.5  --  2.3  HGB 13.9 14.3 12.3  HCT 38.9 42.0 36.4  MCV 92.4  --  92.6  PLT 205  --  176   Coagulation:  Recent Labs Lab 04/24/13 1740  LABPROT 12.5  INR 0.95   Cardiac Enzymes:  Recent Labs Lab 04/24/13 1740  TROPONINI <0.30   Urinalysis: No results found for this basename: COLORURINE, APPERANCEUR, LABSPEC, PHURINE, GLUCOSEU, HGBUR, BILIRUBINUR, KETONESUR, PROTEINUR, UROBILINOGEN, NITRITE, LEUKOCYTESUR,  in the last 168 hours Lipid Panel    Component Value Date/Time   CHOL 184 04/25/2013 0616   TRIG 222* 04/25/2013 0616   HDL 45 04/25/2013 0616   CHOLHDL 4.1 04/25/2013 0616   VLDL 44* 04/25/2013 0616   LDLCALC 95 04/25/2013 0616   HgbA1C  Lab Results  Component Value Date   HGBA1C 6.5* 02/15/2013    Urine Drug Screen:  No results found for this basename: labopia, cocainscrnur, labbenz, amphetmu, thcu, labbarb    Alcohol Level: No results found for this basename: ETH,  in the last 168 hours  Dg Chest 2 View 04/24/2013   : No active cardiopulmonary disease.  Ct Head (brain) Wo Contrast 04/24/2013   No acute intracranial abnormality.  Sinusitis      MRI of the brain    MRA of the brain    EEG:  2D Echocardiogram  EF 60%, wall motion was normal, LA normal in size.  Carotid Doppler    CXR    EKG  normal sinus rhythm.   Therapy Recommendations   Physical Exam   Pleasant middle-aged Caucasian lady currently not in distress.Awake alert. Afebrile. Head is nontraumatic. Neck is supple without bruit. Hearing is normal. Cardiac exam no murmur or gallop. Lungs are clear to auscultation. Distal pulses are well felt. Neurological Exam ;  Awake  Alert oriented x 3. Normal speech and  language.eye movements full without nystagmus.fundi were not visualized. Vision acuity and fields appear normal. Hearing is normal. Palatal movements are normal. Face symmetric. Tongue midline. Normal strength, tone, reflexes and coordination diminished fine finger movements on the right. Orbits left lower right upper extremity.. Normal sensation. Gait deferred. ASSESSMENT Ms. Margaret Vazquez is a 77 y.o. female presenting with right upper extremity weakness. Imaging pending.  On ticlid prior to admission. Now on ticlid for secondary stroke prevention. Patient with resultant right upper extremity weakness. Work up underway.   Hypertension  Diabetes mellitus, type 2, controlled, HGB A1C  6.5  Hyperlipidemia, LDL 95, at goal: no indication for lipid lowering agent  Migraine  Coronary artery disease  Patient had myalgias with lipitor and zetia  Has loop  Hospital day # 1  TREATMENT/PLAN  Continue ticlid for secondary stroke prevention.  Risk factor modification  EEG, carotid pending  Therapy evaluations pending.  MRI pending  Gwendolyn Lima. Manson Passey, [facility], MBA, MHA [facility] Pager: [phone] 04/25/2013 2:19 PM  I have personally obtained a history, examined the patient, evaluated imaging results, and formulated the assessment and plan of care. I agree with the above. Delia Heady, MD

## 2013-04-26 DIAGNOSIS — N390 Urinary tract infection, site not specified: Secondary | ICD-10-CM

## 2013-04-26 DIAGNOSIS — R29898 Other symptoms and signs involving the musculoskeletal system: Secondary | ICD-10-CM

## 2013-04-26 DIAGNOSIS — J309 Allergic rhinitis, unspecified: Secondary | ICD-10-CM

## 2013-04-26 LAB — GLUCOSE, CAPILLARY
Glucose-Capillary: 101 mg/dL — ABNORMAL HIGH (ref 70–99)
Glucose-Capillary: 143 mg/dL — ABNORMAL HIGH (ref 70–99)

## 2013-04-26 MED ORDER — LEVETIRACETAM 250 MG PO TABS: 250.0000 mg | ORAL_TABLET | Freq: Two times a day (BID) | ORAL | Status: DC

## 2013-04-26 MED ORDER — LEVETIRACETAM 250 MG PO TABS
250.0000 mg | ORAL_TABLET | Freq: Two times a day (BID) | ORAL | Status: DC
  Administered 2013-04-26: 250 mg via ORAL
  Filled 2013-04-26 (×2): qty 1

## 2013-04-26 NOTE — Progress Notes (Addendum)
Occupational Therapy Treatment Patient Details Name: Margaret Vazquez MRN: [id number] DOB: 08-22-1935 Today's Date: 04/26/2013 Time: 7829-5621 OT Time Calculation (min): 30 min  OT Assessment / Plan / Recommendation  History of present illness Margaret Vazquez is a 77 y.o. female with history of stroke this on August 2014, presented to the ER because of persistent weakness of the right upper extremity and heaviness since last night. Patient otherwise does not have any weakness of the left upper extremity or both lower extremities. Denies any blurred vision, difficulty speaking or swallowing. In the ER CT head did not show anything acute. Patient has been admitted for further management.   OT comments  Session focused on exercises for UE's as well as fine motor and gross motor coordination activities.   Follow Up Recommendations  Followup with neurologist about new onset of blurry vision    Barriers to Discharge       Equipment Recommendations  None recommended by OT    Recommendations for Other Services    Frequency Min 2X/week   Progress towards OT Goals Progress towards OT goals: Progressing toward goals  Plan Discharge plan remains appropriate    Precautions / Restrictions Restrictions Weight Bearing Restrictions: No   Pertinent Vitals/Pain No pain reported.     ADL  Toilet Transfer: Modified independent Toilet Transfer Method: Sit to Barista: Other (comment) (from bed) Equipment Used: Other (comment) (squeeze ball and theraband) Transfers/Ambulation Related to ADLs: Mod I/ Min guard with ambulation-one slight LOB during session. Mod I with transfers. ADL Comments: Session focused on UE coordination activities as well as exercises. See exercise section below. Had pt stacking paper clips, placing them in a cup, and also picking up paper clips into palm and trying to put one down on table at a time. Also, had pt lacing and tying shoe and touching each finger  as fast as she could. Also, had pt reaching for objects and then reaching across body to place them down-educated on how to grasp object. Educated to let neurologist know about her blurry vision.    OT Diagnosis:    OT Problem List:   OT Treatment Interventions:     OT Goals(current goals can now be found in the care plan section) Acute Rehab OT Goals Patient Stated Goal: not stated OT Goal Formulation: With patient Time For Goal Achievement: 05/02/13 Potential to Achieve Goals: Good ADL Goals Pt Will Perform Upper Body Dressing: with modified independence;sitting Pt Will Perform Lower Body Dressing: with modified independence;sit to/from stand Additional ADL Goal #1: Pt will independently perform bilateral upper extremity exercises to increase strength.  Additional ADL Goal #2: Pt will independently perform fine motor coordination activities on handout provided to increase coordination in right hand.  Additional ADL Goal #3: Pt will participate in further vision assessment.   Visit Information  Last OT Received On: 04/26/13 Assistance Needed: +1 History of Present Illness: Margaret Vazquez is a 77 y.o. female with history of stroke this on August 2014, presented to the ER because of persistent weakness of the right upper extremity and heaviness since last night. Patient otherwise does not have any weakness of the left upper extremity or both lower extremities. Denies any blurred vision, difficulty speaking or swallowing. In the ER CT head did not show anything acute. Patient has been admitted for further management.    Subjective Data      Prior Functioning       Cognition  Cognition Arousal/Alertness: Awake/alert Behavior During  Therapy: WFL for tasks assessed/performed Overall Cognitive Status: Within Functional Limits for tasks assessed    Mobility  Bed Mobility Bed Mobility: Not assessed (sitting EOB when OT arrived) Transfers Transfers: Sit to Stand;Stand to Sit Sit to  Stand: 6: Modified independent (Device/Increase time);From bed;From chair/3-in-1 Stand to Sit: 6: Modified independent (Device/Increase time);To bed;To chair/3-in-1    Exercises  Other Exercises Other Exercises: chair push ups-approximately 10 reps Other Exercises: theraband exercises-shoulder flexion, elbow flexion/extension Other Exercises: Isometric exercises-abduction (theraband hurt her neck when trying to strengthen abductors) Other Exercises: Squeeze ball    Balance     End of Session OT - End of Session Activity Tolerance: Patient tolerated treatment well Patient left: in bed;with call bell/phone within reach (sitting EOB)  GO     Earlie Raveling OTR/L [phone] 04/26/2013, 10:58 AM

## 2013-04-26 NOTE — Discharge Summary (Signed)
Physician Discharge Summary  Margaret Vazquez ZOX:[id number] DOB: 05/29/1936 DOA: 04/24/2013  PCP: Roxy Manns, MD  Admit date: 04/24/2013 Discharge date: 04/26/2013  Time spent: > 35 minutes  Recommendations for Outpatient Follow-up:  1. Patient will need to followup with neurology in 2 months  Discharge Diagnoses:  Principal Problem:   CVA (cerebral infarction) Active Problems:   DIABETES MELLITUS, TYPE II   HYPERLIPIDEMIA   HYPERTENSION, ESSENTIAL NOS   Discharge Condition: stable  Diet recommendation: low-sodium diet, modified/diabetic diet  Filed Weights   04/24/13 2110  Weight: 93.985 kg (207 lb 3.2 oz)    History of present illness:  Ms. Margaret Vazquez is a 77 y.o. female presenting with right upper extremity weakness and in addition had stiffening and "twisting" of her right arm, unable to stop. Imaging shows no acute stroke. Although EEG does no capture ongoing seizure, we are going to treat for potential seizure although we are not diagnosing seizure ( for hand movement ); but we will start a low dose keppra. On ticlid prior to admission. Now on ticlid for secondary stroke prevention. Patient with resultant right upper extremity weakness. OT/PT no follow up.  Hospital Course:  1. Right upper extremity weakness: transient and currently resolved -neurology on board and routine workup for acute stroke negative.  - Physical therapy has evaluated and recommended no PT followup  - neurology evaluated and recommended the following: Continue ticlid for secondary stroke prevention. MRI shows no acute infarction. Keppra 250mg  twice a day for potential seizure Risk factor modification Have patient follow up with Dr. Pearlean Brownie in 2 months. Keep her appointment that she has in December. Stroke service to sign off.  2. H/o CVA -Plan on continuing the Ticlid for secondary stroke prevention, the patient was reporting aspirin allergy and prior feelings of lethargy while on Plavix    2.  Diabetes mellitus type 2 - We'll plan on continuing home medications. Patient is to continue diabetic diet and routine blood sugar monitoring   3. Hypertension - continue present medications. Will allow for permissive hypertension given possibility of #1   4. Hyperlipidemia - continue present medications.   Procedures:  Dg Chest 2 View  04/24/2013 : No active cardiopulmonary disease.  Ct Head (brain) Wo Contrast  04/24/2013 No acute intracranial abnormality. Sinusitis  MRI of the brain  No evidence for acute or subacute infarction.  2. Stable mild atrophy and white matter disease. This likely  represents the sequelae of chronic microvascular ischemia.  3. Progression of diffuse sinus disease  MRA of the brain  Stable moderate to severe proximal left M1 segment stenosis.  2. Asymmetric distal small vessel disease within the left MCA branch  vessels  WJX:BJYN normal EEG recording during wakefulness. No evidence of an epileptic disorder was demonstrated.  2D Echocardiogram EF 60%, wall motion was normal, LA normal in size.  Carotid Doppler Findings suggest 1-39% internal carotid artery stenosis. Vertebral arteries are patent with antegrade flow.  EKG normal sinus rhythm.   Consultations:  Neurology  Discharge Exam: Filed Vitals:   04/26/13 1039  BP: 127/62  Pulse: 55  Temp: 98.1 F (36.7 C)  Resp: 18    General: Pt in NAD, Alert and awake Cardiovascular: RRR, no MRG Respiratory: CTA BL, no wheezes  Discharge Instructions  Discharge Orders   Future Appointments Provider Department Dept Phone   05/09/2013 8:45 AM [facility] [phone]   05/11/2013 2:15 PM Micki Riley, MD [facility] [phone]  05/16/2013 11:15 AM Judy Pimple, MD [facility] [place] [facility] [phone]   06/07/2013 9:30 AM Duke Salvia, MD [facility] [place] Office [phone]   Future Orders Complete By Expires   Call MD  for:  redness, tenderness, or signs of infection (pain, swelling, redness, odor or green/yellow discharge around incision site)  As directed    Call MD for:  temperature >100.4  As directed    Diet - low sodium heart healthy  As directed    Discharge instructions  As directed    Comments:     Please be sure to followup with neurology (Dr. Pearlean Brownie) in 2 months.   Increase activity slowly  As directed        Medication List         carvedilol 6.25 MG tablet  Commonly known as:  COREG  Take 1 tablet (6.25 mg total) by mouth 2 (two) times daily with a meal.     EPINEPHrine 0.3 mg/0.3 mL Devi  Commonly known as:  EPI-PEN  Inject 0.3 mg into the muscle once.     glipiZIDE 5 MG 24 hr tablet  Commonly known as:  GLUCOTROL XL  Take 1 tablet (5 mg total) by mouth daily.     levETIRAcetam 250 MG tablet  Commonly known as:  KEPPRA  Take 1 tablet (250 mg total) by mouth 2 (two) times daily.     olmesartan 40 MG tablet  Commonly known as:  BENICAR  Take 1 tablet (40 mg total) by mouth daily.     ticlopidine 250 MG tablet  Commonly known as:  TICLID  Take 1 tablet (250 mg total) by mouth 2 (two) times daily with a meal.       Allergies  Allergen Reactions  . Bee Venom Hives, Shortness Of Breath and Swelling  . Nabumetone Anaphylaxis  . Amoxicillin-Pot Clavulanate Hives and Swelling    To lips.  . Aspirin Hives  . Atorvastatin Swelling     joint pain/swelling, inc liver tests  . Clopidogrel Bisulfate Hives  . Codeine Nausea And Vomiting  . Ezetimibe Other (See Comments)     fatigue  . Metformin And Related Other (See Comments)    Diarrhea   . Valsartan Other (See Comments)     fatigue      The results of significant diagnostics from this hospitalization (including imaging, microbiology, ancillary and laboratory) are listed below for reference.    Significant Diagnostic Studies: Dg Chest 2 View  04/24/2013   CLINICAL DATA:  Stroke  EXAM: CHEST  2 VIEW  COMPARISON:   02/14/2013  FINDINGS: Heart size and vascularity are normal. Cardiac monitoring device is present in the anterior subcutaneous tissues.  Negative for infiltrate effusion or mass. Lungs are clear. Lungs are hyperinflated.  IMPRESSION: No active cardiopulmonary disease.   Electronically Signed   By: Marlan Palau M.D.   On: 04/24/2013 23:24   Ct Head (brain) Wo Contrast  04/24/2013   CLINICAL DATA:  Numbness  EXAM: CT HEAD WITHOUT CONTRAST  TECHNIQUE: Contiguous axial images were obtained from the base of the skull through the vertex without intravenous contrast.  COMPARISON:  CT 02/14/2013  FINDINGS: Ventricle size is normal. Negative for acute infarct. Negative for hemorrhage or mass lesion.  Sinusitis with near complete opacification right maxillary sinus and air-fluid level left maxillary sinus, similar to the prior study. There is mucosal edema in the frontal and ethmoid and sphenoid sinuses. No acute bony changes.  IMPRESSION: No  acute intracranial abnormality.  Sinusitis   Electronically Signed   By: Marlan Palau M.D.   On: 04/24/2013 18:55   Mr Maxine Glenn Head Wo Contrast  04/25/2013   CLINICAL DATA:  Right upper extremity weakness beginning yesterday.  EXAM: MRI HEAD WITHOUT CONTRAST  MRA HEAD WITHOUT CONTRAST  TECHNIQUE: Multiplanar, multiecho pulse sequences of the brain and surrounding structures were obtained without intravenous contrast. Angiographic images of the head were obtained using MRA technique without contrast.  COMPARISON:  CT head without contrast 04/24/2013. MRI brain 02/14/2013.  FINDINGS: MRI HEAD FINDINGS  The diffusion-weighted images demonstrate no evidence for acute or subacute infarction. Scattered white matter changes are stable. No acute hemorrhage or mass lesion is present. White matter changes extend into the brainstem as before. Mild generalized atrophy is within normal limits for age. No significant extra-axial fluid collections are present. Flow is present in the major  intracranial arteries. Chronic maxillary sinus disease is similar to the prior exams fresh that chronic maxillary disease is similar prior exam. Ethmoid sinus disease has progressed. Chronic right frontal sinus opacification is 7 node again noted. Mucosal thickening in the left frontal sinus has progressed. Right greater than left sphenoid sinus disease is present. There is some fluid in the left mastoid air cells. No obstructing nasopharyngeal lesion is evident.  The postcontrast images demonstrate no pathologic enhancement of the brain. Diffuse mucosal thickening is present throughout the paranasal sinuses. The globes and orbits are intact.  MRA HEAD FINDINGS  The internal carotid arteries are within normal limits from the high cervical segments through the ICA termini bilaterally. Moderate to severe stenosis is present in the proximal left M1 segment, similar to the prior exam. No definite anterior communicating artery is evident. The A1 segments and right M1 segment are within normal limits. Segmental attenuation of distal MCA branch vessels is worse left than right.  The vertebral arteries are incompletely imaged. The distal basilar artery is within normal limits. Both posterior cerebral arteries originate from the basilar tip. There is contribution from a right posterior communicating artery. There is mild attenuation of distal PCA branch vessels.  IMPRESSION: MRI HEAD IMPRESSION  1. No evidence for acute or subacute infarction. 2. Stable mild atrophy and white matter disease. This likely represents the sequelae of chronic microvascular ischemia. 3. Progression of diffuse sinus disease.  MRA HEAD IMPRESSION  1. Stable moderate to severe proximal left M1 segment stenosis. 2. Asymmetric distal small vessel disease within the left MCA branch vessels.   Electronically Signed   By: Gennette Pac M.D.   On: 04/25/2013 17:28   Mr Laqueta Jean ZO Contrast  04/25/2013   CLINICAL DATA:  Right upper extremity weakness  beginning yesterday.  EXAM: MRI HEAD WITHOUT CONTRAST  MRA HEAD WITHOUT CONTRAST  TECHNIQUE: Multiplanar, multiecho pulse sequences of the brain and surrounding structures were obtained without intravenous contrast. Angiographic images of the head were obtained using MRA technique without contrast.  COMPARISON:  CT head without contrast 04/24/2013. MRI brain 02/14/2013.  FINDINGS: MRI HEAD FINDINGS  The diffusion-weighted images demonstrate no evidence for acute or subacute infarction. Scattered white matter changes are stable. No acute hemorrhage or mass lesion is present. White matter changes extend into the brainstem as before. Mild generalized atrophy is within normal limits for age. No significant extra-axial fluid collections are present. Flow is present in the major intracranial arteries. Chronic maxillary sinus disease is similar to the prior exams fresh that chronic maxillary disease is similar prior exam. Ethmoid sinus  disease has progressed. Chronic right frontal sinus opacification is 7 node again noted. Mucosal thickening in the left frontal sinus has progressed. Right greater than left sphenoid sinus disease is present. There is some fluid in the left mastoid air cells. No obstructing nasopharyngeal lesion is evident.  The postcontrast images demonstrate no pathologic enhancement of the brain. Diffuse mucosal thickening is present throughout the paranasal sinuses. The globes and orbits are intact.  MRA HEAD FINDINGS  The internal carotid arteries are within normal limits from the high cervical segments through the ICA termini bilaterally. Moderate to severe stenosis is present in the proximal left M1 segment, similar to the prior exam. No definite anterior communicating artery is evident. The A1 segments and right M1 segment are within normal limits. Segmental attenuation of distal MCA branch vessels is worse left than right.  The vertebral arteries are incompletely imaged. The distal basilar artery is  within normal limits. Both posterior cerebral arteries originate from the basilar tip. There is contribution from a right posterior communicating artery. There is mild attenuation of distal PCA branch vessels.  IMPRESSION: MRI HEAD IMPRESSION  1. No evidence for acute or subacute infarction. 2. Stable mild atrophy and white matter disease. This likely represents the sequelae of chronic microvascular ischemia. 3. Progression of diffuse sinus disease.  MRA HEAD IMPRESSION  1. Stable moderate to severe proximal left M1 segment stenosis. 2. Asymmetric distal small vessel disease within the left MCA branch vessels.   Electronically Signed   By: Gennette Pac M.D.   On: 04/25/2013 17:28    Microbiology: No results found for this or any previous visit (from the past 240 hour(s)).   Labs: Basic Metabolic Panel:  Recent Labs Lab 04/24/13 1740 04/24/13 1749 04/25/13 0616  NA 137 138 140  K 4.6 7.6* 4.0  CL 103 108 104  CO2 24  --  27  GLUCOSE 121* 122* 110*  BUN 11 15 10   CREATININE 0.77 0.90 0.89  CALCIUM 9.1  --  8.5   Liver Function Tests:  Recent Labs Lab 04/24/13 1740 04/25/13 0616  AST 22 12  ALT 15 11  ALKPHOS 91 79  BILITOT 0.2* 0.2*  PROT 7.4 6.3  ALBUMIN 3.6 3.1*   No results found for this basename: LIPASE, AMYLASE,  in the last 168 hours No results found for this basename: AMMONIA,  in the last 168 hours CBC:  Recent Labs Lab 04/24/13 1740 04/24/13 1749 04/25/13 0616  WBC 5.8  --  5.8  NEUTROABS 2.5  --  2.3  HGB 13.9 14.3 12.3  HCT 38.9 42.0 36.4  MCV 92.4  --  92.6  PLT 205  --  176   Cardiac Enzymes:  Recent Labs Lab 04/24/13 1740  TROPONINI <0.30   BNP: BNP (last 3 results) No results found for this basename: PROBNP,  in the last 8760 hours CBG:  Recent Labs Lab 04/25/13 0645 04/25/13 1138 04/25/13 1729 04/25/13 2104 04/26/13 0636  GLUCAP 109* 105* 83 159* 101*       Signed:  Penny Pia  [facility] 04/26/2013, 10:51  AM

## 2013-04-26 NOTE — Progress Notes (Signed)
Patient is being discharged from room 4N02 at this time. Alert and in stable condition. IV site d/c'd as well as tele. Instructions read to patient and verbalized understanding.

## 2013-04-26 NOTE — Progress Notes (Signed)
Stroke Team Progress Note  HISTORY Margaret Vazquez is a 77 y.o. female with a history of severe left MCA stenosis who presents with worsening right arm weakness over the past 24 hours. She is currently taking Ticlid secondary to an aspirin allergy and previous feeling of lethargy while taking Plavix.   She had a multifocal stroke 2 months ago in the distribution of the left MCA but had improved to the point of not having any deficits. Then yesterday evening she had a transient episode of "tightening" that lasted 1-2 seconds and then since has had weakness that has progressively gotten worse in her right arm.   LKW: 10/18  tpa given: no, outside of window   She was admitted for further evaluation and treatment.  SUBJECTIVE Feels better. Ready to go home.  OBJECTIVE Most recent Vital Signs: Filed Vitals:   04/25/13 1802 04/25/13 2101 04/26/13 0210 04/26/13 0532  BP:  168/73 140/46 135/65  Pulse: 62 55 51 54  Temp:  98.5 F (36.9 C) 97.3 F (36.3 C) 98.4 F (36.9 C)  TempSrc:  Oral Oral Oral  Resp:  18 18 18   Height:      Weight:      SpO2:  98% 99% 98%   CBG (last 3)   Recent Labs  04/25/13 1729 04/25/13 2104 04/26/13 0636  GLUCAP 83 159* 101*    IV Fluid Intake:      MEDICATIONS  . carvedilol  6.25 mg Oral BID WC  . enoxaparin (LOVENOX) injection  40 mg Subcutaneous Q24H  . glipiZIDE  5 mg Oral QAC breakfast  . influenza vac split quadrivalent PF  0.5 mL Intramuscular Tomorrow-1000  . insulin aspart  0-9 Units Subcutaneous TID WC  . irbesartan  300 mg Oral Daily  . ticlopidine  250 mg Oral BID WC   PRN:  acetaminophen, hydrALAZINE, senna-docusate  Diet:  Carb Control thin liquids Activity:  OOB with assistance DVT Prophylaxis:  lovenox  CLINICALLY SIGNIFICANT STUDIES Basic Metabolic Panel:   Recent Labs Lab 04/24/13 1740 04/24/13 1749 04/25/13 0616  NA 137 138 140  K 4.6 7.6* 4.0  CL 103 108 104  CO2 24  --  27  GLUCOSE 121* 122* 110*  BUN 11 15 10    CREATININE 0.77 0.90 0.89  CALCIUM 9.1  --  8.5   Liver Function Tests:   Recent Labs Lab 04/24/13 1740 04/25/13 0616  AST 22 12  ALT 15 11  ALKPHOS 91 79  BILITOT 0.2* 0.2*  PROT 7.4 6.3  ALBUMIN 3.6 3.1*   CBC:   Recent Labs Lab 04/24/13 1740 04/24/13 1749 04/25/13 0616  WBC 5.8  --  5.8  NEUTROABS 2.5  --  2.3  HGB 13.9 14.3 12.3  HCT 38.9 42.0 36.4  MCV 92.4  --  92.6  PLT 205  --  176   Coagulation:   Recent Labs Lab 04/24/13 1740  LABPROT 12.5  INR 0.95   Cardiac Enzymes:   Recent Labs Lab 04/24/13 1740  TROPONINI <0.30   Urinalysis: No results found for this basename: COLORURINE, APPERANCEUR, LABSPEC, PHURINE, GLUCOSEU, HGBUR, BILIRUBINUR, KETONESUR, PROTEINUR, UROBILINOGEN, NITRITE, LEUKOCYTESUR,  in the last 168 hours Lipid Panel    Component Value Date/Time   CHOL 184 04/25/2013 0616   TRIG 222* 04/25/2013 0616   HDL 45 04/25/2013 0616   CHOLHDL 4.1 04/25/2013 0616   VLDL 44* 04/25/2013 0616   LDLCALC 95 04/25/2013 0616   HgbA1C  Lab Results  Component Value Date  HGBA1C 6.7* 04/25/2013    Urine Drug Screen:   No results found for this basename: labopia,  cocainscrnur,  labbenz,  amphetmu,  thcu,  labbarb    Alcohol Level: No results found for this basename: ETH,  in the last 168 hours  Dg Chest 2 View 04/24/2013   : No active cardiopulmonary disease.  Ct Head (brain) Wo Contrast 04/24/2013   No acute intracranial abnormality.  Sinusitis      MRI of the brain   No evidence for acute or subacute infarction.  2. Stable mild atrophy and white matter disease. This likely  represents the sequelae of chronic microvascular ischemia.  3. Progression of diffuse sinus disease  MRA of the brain   Stable moderate to severe proximal left M1 segment stenosis.  2. Asymmetric distal small vessel disease within the left MCA branch  vessels  ZOX:WRUE normal EEG recording during wakefulness. No evidence of an epileptic disorder was  demonstrated.   2D Echocardiogram  EF 60%, wall motion was normal, LA normal in size.  Carotid Doppler  Findings suggest 1-39% internal carotid artery stenosis. Vertebral arteries are patent with antegrade flow.  EKG  normal sinus rhythm.   Therapy Recommendations NONE  Physical Exam   Pleasant middle-aged Caucasian lady currently not in distress.Awake alert. Afebrile. Head is nontraumatic. Neck is supple without bruit. Hearing is normal. Cardiac exam no murmur or gallop. Lungs are clear to auscultation. Distal pulses are well felt. Neurological Exam ;  Awake  Alert oriented x 3. Normal speech and language.eye movements full without nystagmus.fundi were not visualized. Vision acuity and fields appear normal. Hearing is normal. Palatal movements are normal. Face symmetric. Tongue midline. Normal strength, tone, reflexes and coordination diminished fine finger movements on the right. Orbits left lower right upper extremity.. Normal sensation. Gait deferred.  ASSESSMENT Margaret Vazquez is a 77 y.o. female presenting with right upper extremity weakness and in addition had stiffening and "twisting" of her right arm, unable to stop. Imaging shows no acute stroke. Although EEG does no capture ongoing seizure, we are going to treat for potential seizure although we are not diagnosing seizure ( for hand movement ); but we will start a low dose keppra.  On ticlid prior to admission. Now on ticlid for secondary stroke prevention. Patient with resultant right upper extremity weakness. OT/PT no follow up.   Hypertension  Diabetes mellitus, type 2, controlled, HGB A1C  6.5  Hyperlipidemia, LDL 95, at goal: no indication for lipid lowering agent  Migraine  Coronary artery disease  Patient had myalgias with lipitor and zetia  Has loop  Hospital day # 2  TREATMENT/PLAN  Continue ticlid for secondary stroke prevention. MRI shows no acute infarction.  Keppra 250mg  twice a day for potential  seizure   Risk factor modification  Have patient follow up with Dr. Pearlean Brownie in 2 months. Keep her appointment that she has in December.  Stroke service to sign off.  Gwendolyn Lima. Manson Passey, PAC, MBA, MHA [facility] Pager: [phone] 04/26/2013 8:00 AM  I have personally obtained a history, examined the patient, evaluated imaging results, and formulated the assessment and plan of care. I agree with the above.  Delia Heady, MD

## 2013-05-02 ENCOUNTER — Ambulatory Visit: Payer: Medicare Other | Admitting: Family Medicine

## 2013-05-05 ENCOUNTER — Emergency Department (HOSPITAL_COMMUNITY)
Admission: EM | Admit: 2013-05-05 | Discharge: 2013-05-05 | Disposition: A | Discharge status: Home / Self Care | Payer: Medicare Other | Attending: Emergency Medicine | Admitting: Emergency Medicine

## 2013-05-05 ENCOUNTER — Encounter (HOSPITAL_COMMUNITY): Payer: Self-pay | Admitting: Emergency Medicine

## 2013-05-05 DIAGNOSIS — Z85828 Personal history of other malignant neoplasm of skin: Secondary | ICD-10-CM | POA: Insufficient documentation

## 2013-05-05 DIAGNOSIS — Z7901 Long term (current) use of anticoagulants: Secondary | ICD-10-CM | POA: Insufficient documentation

## 2013-05-05 DIAGNOSIS — Z860100 Personal history of colon polyps, unspecified: Secondary | ICD-10-CM | POA: Insufficient documentation

## 2013-05-05 DIAGNOSIS — Z880 Allergy status to penicillin: Secondary | ICD-10-CM | POA: Insufficient documentation

## 2013-05-05 DIAGNOSIS — G43909 Migraine, unspecified, not intractable, without status migrainosus: Secondary | ICD-10-CM | POA: Insufficient documentation

## 2013-05-05 DIAGNOSIS — E119 Type 2 diabetes mellitus without complications: Secondary | ICD-10-CM | POA: Insufficient documentation

## 2013-05-05 DIAGNOSIS — R21 Rash and other nonspecific skin eruption: Secondary | ICD-10-CM | POA: Insufficient documentation

## 2013-05-05 DIAGNOSIS — Z9861 Coronary angioplasty status: Secondary | ICD-10-CM | POA: Insufficient documentation

## 2013-05-05 DIAGNOSIS — Z79899 Other long term (current) drug therapy: Secondary | ICD-10-CM | POA: Insufficient documentation

## 2013-05-05 DIAGNOSIS — IMO0002 Reserved for concepts with insufficient information to code with codable children: Secondary | ICD-10-CM | POA: Insufficient documentation

## 2013-05-05 DIAGNOSIS — I10 Essential (primary) hypertension: Secondary | ICD-10-CM | POA: Insufficient documentation

## 2013-05-05 DIAGNOSIS — T7840XA Allergy, unspecified, initial encounter: Secondary | ICD-10-CM

## 2013-05-05 DIAGNOSIS — Z8673 Personal history of transient ischemic attack (TIA), and cerebral infarction without residual deficits: Secondary | ICD-10-CM | POA: Insufficient documentation

## 2013-05-05 DIAGNOSIS — Z8739 Personal history of other diseases of the musculoskeletal system and connective tissue: Secondary | ICD-10-CM | POA: Insufficient documentation

## 2013-05-05 DIAGNOSIS — T4275XA Adverse effect of unspecified antiepileptic and sedative-hypnotic drugs, initial encounter: Secondary | ICD-10-CM | POA: Insufficient documentation

## 2013-05-05 DIAGNOSIS — I2510 Atherosclerotic heart disease of native coronary artery without angina pectoris: Secondary | ICD-10-CM | POA: Insufficient documentation

## 2013-05-05 DIAGNOSIS — Z8719 Personal history of other diseases of the digestive system: Secondary | ICD-10-CM | POA: Insufficient documentation

## 2013-05-05 MED ORDER — FAMOTIDINE IN NACL 20-0.9 MG/50ML-% IV SOLN
20.0000 mg | Freq: Once | INTRAVENOUS | Status: AC
  Administered 2013-05-05: 20 mg via INTRAVENOUS
  Filled 2013-05-05: qty 50

## 2013-05-05 MED ORDER — GABAPENTIN 300 MG PO CAPS: 300.0000 mg | ORAL_CAPSULE | Freq: Three times a day (TID) | ORAL | Status: DC

## 2013-05-05 MED ORDER — PREDNISONE 20 MG PO TABS: 40.0000 mg | ORAL_TABLET | Freq: Every day | ORAL | Status: DC

## 2013-05-05 MED ORDER — METHYLPREDNISOLONE SODIUM SUCC 125 MG IJ SOLR
125.0000 mg | Freq: Once | INTRAMUSCULAR | Status: AC
  Administered 2013-05-05: 125 mg via INTRAVENOUS
  Filled 2013-05-05: qty 2

## 2013-05-05 NOTE — ED Provider Notes (Signed)
CSN: [id number]     Arrival date & time 05/05/13  1821 History   First MD Initiated Contact with Patient 05/05/13 1831     Chief Complaint  Patient presents with  . Allergic Reaction   (Consider location/radiation/quality/duration/timing/severity/associated sxs/prior Treatment) The history is provided by the patient.   patient developed rash and the feeling of swelling in her throat. Began about an hour prior to arrival. She's a previous history of allergies. She states she recently started Keppra. It began around a week ago. No fevers. No cough. The rashes on her arms and back. She states she feels as if there is a swelling in her throat. No swelling of her tongue. She has an EpiPen but has not used it.  Past Medical History  Diagnosis Date  . Hypertension   . Diabetes mellitus     type II  . Hyperlipidemia     myalgias with Lipitor and Zetia  . GERD (gastroesophageal reflux disease)   . Skin cancer     hx of basal cell/ak's  . Colon polyps 2009  . Allergy history, drug     Aspirin  . Cervical stenosis of spine     With neck pain  . CAD (coronary artery disease) 04/08/11    non obst by cath  . Migraine   . TIA (transient ischemic attack)    Past Surgical History  Procedure Laterality Date  . Appendectomy    . Tubal ligation      BTL  . Breast surgery  1992    breast biopsy  . Eye surgery  2005    tear duct surgery  . Nasal sinus surgery  01/2005  . Cardiac catheterization  1993    LHC: normal coronaries.   . Stress cardiolite  11/1999    Normal/ negative  . Cystoscopy w/ decannulation  03/2000    Normal  . Abd [place]  07/2003    Negative  . Tear duct surgery  2005  . Colonoscopy  12/2007    Adenomatous colon polyps  . Cardiac catheterization  04/07/2011    non obst CAD (Dr Burt Knack)  . Tee without cardioversion N/A 02/16/2013    Procedure: TRANSESOPHAGEAL ECHOCARDIOGRAM (TEE);  Surgeon: Josue Hector, MD;  Location: [facility] ENDOSCOPY;  Service: Cardiovascular;  Laterality:  N/A;   Family History  Problem Relation Age of Onset  . Lung cancer Brother   . Colon cancer Neg Hx   . Skin cancer Daughter   . Diabetes Sister   . Diabetes Brother    History  Substance Use Topics  . Smoking status: Never Smoker   . Smokeless tobacco: Never Used  . Alcohol Use: No   OB History   Grav Para Term Preterm Abortions TAB SAB Ect Mult Living                 Review of Systems  Constitutional: Negative for activity change and appetite change.  HENT: Negative for nosebleeds and trouble swallowing.   Eyes: Negative for pain.  Respiratory: Negative for cough, chest tightness, shortness of breath and stridor.   Cardiovascular: Negative for chest pain and leg swelling.  Gastrointestinal: Negative for nausea, vomiting, abdominal pain and diarrhea.  Genitourinary: Negative for flank pain.  Musculoskeletal: Negative for back pain and neck stiffness.  Skin: Positive for rash.  Neurological: Negative for weakness, numbness and headaches.  Psychiatric/Behavioral: Negative for behavioral problems.    Allergies  Bee venom; Nabumetone; Amoxicillin-pot clavulanate; Aspirin; Atorvastatin; Clopidogrel bisulfate; Codeine; Ezetimibe; Metformin and  related; and Valsartan  Home Medications   Current Outpatient Rx  Name  Route  Sig  Dispense  Refill  . carvedilol (COREG) 6.25 MG tablet   Oral   Take 1 tablet (6.25 mg total) by mouth 2 (two) times daily with a meal.   60 tablet   11   . diphenhydrAMINE (BENADRYL) 25 MG tablet   Oral   Take 50 mg by mouth once.         Marland Kitchen EPINEPHrine (EPI-PEN) 0.3 mg/0.3 mL DEVI   Intramuscular   Inject 0.3 mg into the muscle once.         Marland Kitchen glipiZIDE (GLUCOTROL XL) 5 MG 24 hr tablet   Oral   Take 1 tablet (5 mg total) by mouth daily.   30 tablet   11   . olmesartan (BENICAR) 40 MG tablet   Oral   Take 1 tablet (40 mg total) by mouth daily.   30 tablet   11   . ticlopidine (TICLID) 250 MG tablet   Oral   Take 1 tablet (250  mg total) by mouth 2 (two) times daily with a meal.   60 tablet   4   . gabapentin (NEURONTIN) 300 MG capsule   Oral   Take 1 capsule (300 mg total) by mouth 3 (three) times daily. Take 1 pill at bedtime for 5 days, one pill twice a day for 5 days, then one pill 3 times a day.   30 capsule   0   . predniSONE (DELTASONE) 20 MG tablet   Oral   Take 2 tablets (40 mg total) by mouth daily.   4 tablet   0    BP 172/48  Pulse 66  Temp(Src) 98.7 F (37.1 C) (Oral)  Resp 16  SpO2 92% Physical Exam  Nursing note and vitals reviewed. Constitutional: She is oriented to person, place, and time. She appears well-developed and well-nourished.  HENT:  Head: Normocephalic and atraumatic.  Trace edema of posterior pharynx.  Eyes: EOM are normal. Pupils are equal, round, and reactive to light.  Neck: Normal range of motion. Neck supple.  Cardiovascular: Normal rate, regular rhythm and normal heart sounds.   No murmur heard. Pulmonary/Chest: Effort normal and breath sounds normal. No respiratory distress. She has no wheezes. She has no rales.  Abdominal: Soft. Bowel sounds are normal. She exhibits no distension. There is no tenderness. There is no rebound and no guarding.  Musculoskeletal: Normal range of motion.  Neurological: She is alert and oriented to person, place, and time. No cranial nerve deficit.  Skin: Skin is warm and dry. Rash noted.  Diffuse hives.  Psychiatric: She has a normal mood and affect. Her speech is normal.    ED Course  Procedures (including critical care time) Labs Review Labs Reviewed - No data to display Imaging Review No results found.  EKG Interpretation   None       MDM   1. Allergic reaction, initial encounter    Patient with likely allergic reaction. May be due to Breath, although it was started weekly ago. No other new medications or new foods. No insect stings. Patient had a rash nearly resolved after treatment in the ED and around 4 hours of  monitoring. After discussion with neurology to [place] will be changed to Neurontin and the patient will followup with her neurologist    Jasper Riling. Alvino Chapel, MD 05/05/13 [phone]

## 2013-05-05 NOTE — ED Notes (Signed)
Pt back from RR and back in bed w/o incident.

## 2013-05-05 NOTE — ED Notes (Signed)
Has been taking keppra x 1 week. This afternoon started feeling itchy all over then noticed rash starting. Took benadryl at 1630. Denies SOB, throat swelling, angioedema. Resp e/u, no distress.

## 2013-05-05 NOTE — ED Notes (Signed)
Pt ambulated to RR.  Gait steady.

## 2013-05-05 NOTE — Discharge Instructions (Signed)
Stop taking the Keppra. Start Neurontin.

## 2013-05-05 NOTE — ED Notes (Signed)
Pt reports having allergic reaction x 2 hours. Pt reports starting new medicine two weeks ago. Having hives to back and arm and feels sob, feels like airway is closing. At this time, airway is intact, spo2 98% on room air. Pt took benadryl pta.

## 2013-05-08 ENCOUNTER — Telehealth: Payer: Self-pay | Admitting: Family Medicine

## 2013-05-08 NOTE — Telephone Encounter (Signed)
Message copied by Judy Pimple on Sun May 08, 2013  5:52 PM ------      Message from: Alvina Chou      Created: Wed May 04, 2013  2:51 PM      Regarding: Lab orders for Monday, 11.3.14       Labs for a 6 month f/u ------

## 2013-05-09 ENCOUNTER — Other Ambulatory Visit: Payer: Medicare Other

## 2013-05-09 LAB — PACEMAKER DEVICE OBSERVATION

## 2013-05-11 ENCOUNTER — Encounter: Payer: Self-pay | Admitting: Internal Medicine

## 2013-05-11 ENCOUNTER — Encounter: Payer: Self-pay | Admitting: Neurology

## 2013-05-11 ENCOUNTER — Ambulatory Visit (INDEPENDENT_AMBULATORY_CARE_PROVIDER_SITE_OTHER): Payer: Medicare Other | Admitting: Neurology

## 2013-05-11 VITALS — BP 125/64 | HR 57 | Temp 98.8°F | Ht 68.0 in | Wt 209.0 lb

## 2013-05-11 DIAGNOSIS — G40109 Localization-related (focal) (partial) symptomatic epilepsy and epileptic syndromes with simple partial seizures, not intractable, without status epilepticus: Secondary | ICD-10-CM

## 2013-05-11 DIAGNOSIS — I6350 Cerebral infarction due to unspecified occlusion or stenosis of unspecified cerebral artery: Secondary | ICD-10-CM

## 2013-05-11 NOTE — Progress Notes (Signed)
[facility] [address] [place]. [place] [address] [phone]       OFFICE FOLLOW-UP NOTE  Margaret. Margaret Vazquez Date of Birth:  [birth year] Medical Record Number:  [id number]   HPI: Margaret Vazquez is a 74 year Caucasian lady who is seen for followup of recent hospital consults in August and October 2014. She was initially seen on 02/17/39 and when she was admitted for evaluation for multiple episodes of transient right arm numbness and tingling. Her symptoms got worse on 02/14/13 and she had difficulty eating with a folk as well as writing. MRI scan the brain showed multifocal infarcts in the left frontal and parietal MCA territory an MRI of the brain showed severe proximal left M1 stenosis. Transthoracic echocardiogram showed normal ejection fraction without correction is for more than. Transesophageal echocardiogram showed no cardiac source of embolism PFO. Hemoglobin A1c was 6.5%. Total cholesterol was 172, triglycerides 161, HDL 41 and LDL 99 mg percent. She was started on aspirin and Plavix for stroke prevention and advised aggressive risk factor control. She also had a loop recorder implanted for atrial fibrillation detection. She subsequently returned on 8/11 aspirate on 04/25/39 and with transient episode of right upper extremity no weakness in addition to stiffening and twisting of her right arm and inability to control it. She is invalid for for possible sensory seizures with EEG was negative but she was started on low-dose Keppra 250 twice a day. She states she had trouble tolerating Keppra and developed itching as well as rash and was seen on the emergency room on 05/05/13 where Keppra was discontinued and she was switched to gabapentin. She always states that she is having trouble tolerating it and complaints of sleepiness on it even though she is taking only 1 tablet daily. She had a CT head on 1019/14 which was stable. She complains of tiredness and decreased stamina. She's  noticed that she's still has some diminished fine motor skills in the right hand. She states her blood pressure and sugars have been under good control. She remains on ticlopidine for second stroke prevention/she has an aspirin allergy and complained of feeling lethargic while taking Plavix in the past. She has seen me in the past for cervicogenic muscle tension headaches and tremors and was last in the office in 2013  ROS:   14 system review of systems is positive for snoring, itching, rash, sleepiness, weakness, fatigue, tiredness PMH:  Past Medical History  Diagnosis Date  . Hypertension   . Diabetes mellitus     type II  . Hyperlipidemia     myalgias with Lipitor and Zetia  . GERD (gastroesophageal reflux disease)   . Skin cancer     hx of basal cell/ak's  . Colon polyps 2009  . Allergy history, drug     Aspirin  . Cervical stenosis of spine     With neck pain  . CAD (coronary artery disease) 04/08/11    non obst by cath  . Migraine   . TIA (transient ischemic attack)     Social History:  History   Social History  . Marital Status: Married    Spouse Name: N/A    Number of Children: 3  . Years of Education: N/A   Occupational History  . Retired    Social History Main Topics  . Smoking status: Never Smoker   . Smokeless tobacco: Never Used  . Alcohol Use: No  . Drug Use: No  . Sexual Activity: No  Other Topics Concern  . Not on file   Social History Narrative   3 children   Does not drink caffeinated beverages   Cares for SIL with dementia    Medications:   Current Outpatient Prescriptions on File Prior to Visit  Medication Sig Dispense Refill  . carvedilol (COREG) 6.25 MG tablet Take 1 tablet (6.25 mg total) by mouth 2 (two) times daily with a meal.  60 tablet  11  . diphenhydrAMINE (BENADRYL) 25 MG tablet Take 50 mg by mouth once.      Marland Kitchen EPINEPHrine (EPI-PEN) 0.3 mg/0.3 mL DEVI Inject 0.3 mg into the muscle once.      . gabapentin (NEURONTIN) 300 MG  capsule Take 1 capsule (300 mg total) by mouth 3 (three) times daily. Take 1 pill at bedtime for 5 days, one pill twice a day for 5 days, then one pill 3 times a day.  30 capsule  0  . glipiZIDE (GLUCOTROL XL) 5 MG 24 hr tablet Take 1 tablet (5 mg total) by mouth daily.  30 tablet  11  . olmesartan (BENICAR) 40 MG tablet Take 1 tablet (40 mg total) by mouth daily.  30 tablet  11  . predniSONE (DELTASONE) 20 MG tablet Take 2 tablets (40 mg total) by mouth daily.  4 tablet  0  . ticlopidine (TICLID) 250 MG tablet Take 1 tablet (250 mg total) by mouth 2 (two) times daily with a meal.  60 tablet  4  . [DISCONTINUED] levETIRAcetam (KEPPRA) 250 MG tablet Take 1 tablet (250 mg total) by mouth 2 (two) times daily.  60 tablet  0   No current facility-administered medications on file prior to visit.    Allergies:   Allergies  Allergen Reactions  . Bee Venom Hives, Shortness Of Breath and Swelling  . Nabumetone Anaphylaxis  . Amoxicillin-Pot Clavulanate Hives and Swelling    To lips.  . Aspirin Hives  . Atorvastatin Swelling     joint pain/swelling, inc liver tests  . Clopidogrel Bisulfate Hives  . Codeine Nausea And Vomiting  . Ezetimibe Other (See Comments)     fatigue  . Metformin And Related Other (See Comments)    Diarrhea   . Valsartan Other (See Comments)     fatigue    Physical Exam General: well developed, well nourished elderly Caucasian lady, seated, in no evident distress Head: head normocephalic and atraumatic. Orohparynx benign Neck: supple with no carotid or supraclavicular bruits Cardiovascular: regular rate and rhythm, no murmurs Musculoskeletal: no deformity Skin:  no rash/petichiae Vascular:  Normal pulses all extremities Filed Vitals:   05/11/13 1438  BP: 125/64  Pulse: 57  Temp: 98.8 F (37.1 C)    Neurologic Exam Mental Status: Awake and fully alert. Oriented to place and time. Recent and remote memory intact. Attention span, concentration and fund of  knowledge appropriate. Mood and affect appropriate.  Cranial Nerves: Fundoscopic exam reveals sharp disc margins. Pupils equal, briskly reactive to light. Extraocular movements full without nystagmus. Visual fields full to confrontation. Hearing intact. Facial sensation intact. Face, tongue, palate moves normally and symmetrically.  Motor: Normal bulk and tone. Normal strength in all tested extremity muscles. Diminished fine finger movements on the right. Orbits left-to-right upper extremity. Minimal weakness of right grip muscles only. Sensory.: intact to touch and pinprick and vibratory sensation. But has subjective paresthesias in the right hand Coordination: Rapid alternating movements normal in all extremities. Finger-to-nose and heel-to-shin performed accurately bilaterally. Gait and Station: Arises from chair without  difficulty. Stance is normal. Gait demonstrates normal stride length and balance . Not able to heel, toe and tandem walk without difficulty.  Reflexes: 1+ and symmetric. Toes downgoing.   NIHSS  0 Modified Rankin 1  ASSESSMENT: 77 year lady with left middle cerebral artery branch infarcts in August 2014 secondary to severe proximal left M1 stenosis who has had recurrent episodes of right arm paresthesias with one episode in October 2014 with involuntary twisting and lack of control and shaking of the hand raising concern for simple partial seizure. She was started on Keppra but did not tolerate it well due to hives and itching and was changed to gabapentin which also she is having trouble tolerating due to sleepiness.    PLAN: I had a long discussion with the patient and daughter regarding her symptoms, discuss results of her evaluation and answered questions.Discontinue gabapentin and Keppra as she is having trouble tolerating them. Check EEG and transcranial Doppler study. No need for anticonvulsants again unless she has recurrent episodes. Continue Ticlodipine for stroke  prevention and return for followup in 3 months with Heide Guile, NP or call earlier if necessary.

## 2013-05-11 NOTE — Patient Instructions (Signed)
Discontinue gabapentin and Keppra as she is having trouble tolerating them. Check EEG and transcranial Doppler study. No need for anticonvulsants again unless she has recurrent episodes. Continue Ticlodipine for stroke prevention and return for followup in 3 months with Heide Guile, NP or call earlier if necessary.

## 2013-05-12 ENCOUNTER — Other Ambulatory Visit: Payer: Self-pay

## 2013-05-12 NOTE — Progress Notes (Signed)
Remote pacer received  

## 2013-05-16 ENCOUNTER — Ambulatory Visit (INDEPENDENT_AMBULATORY_CARE_PROVIDER_SITE_OTHER): Payer: Medicare Other | Admitting: Family Medicine

## 2013-05-16 ENCOUNTER — Encounter: Payer: Self-pay | Admitting: Family Medicine

## 2013-05-16 ENCOUNTER — Other Ambulatory Visit (INDEPENDENT_AMBULATORY_CARE_PROVIDER_SITE_OTHER): Payer: Medicare Other | Admitting: Radiology

## 2013-05-16 VITALS — BP 124/64 | HR 53 | Temp 98.1°F | Ht 68.0 in | Wt 206.0 lb

## 2013-05-16 DIAGNOSIS — Z23 Encounter for immunization: Secondary | ICD-10-CM

## 2013-05-16 DIAGNOSIS — J329 Chronic sinusitis, unspecified: Secondary | ICD-10-CM | POA: Insufficient documentation

## 2013-05-16 DIAGNOSIS — I10 Essential (primary) hypertension: Secondary | ICD-10-CM

## 2013-05-16 DIAGNOSIS — E119 Type 2 diabetes mellitus without complications: Secondary | ICD-10-CM

## 2013-05-16 DIAGNOSIS — G40109 Localization-related (focal) (partial) symptomatic epilepsy and epileptic syndromes with simple partial seizures, not intractable, without status epilepticus: Secondary | ICD-10-CM

## 2013-05-16 MED ORDER — LEVOFLOXACIN 500 MG PO TABS: 500.0000 mg | ORAL_TABLET | Freq: Every day | ORAL | Status: DC

## 2013-05-16 NOTE — Progress Notes (Signed)
Pre-visit discussion using our clinic review tool. No additional management support is needed unless otherwise documented below in the visit note.  

## 2013-05-16 NOTE — Assessment & Plan Note (Signed)
With some acute symptoms and findings on MRI  tx with 10 d of levaquin and update Disc symptomatic care - see instructions on AVS

## 2013-05-16 NOTE — Assessment & Plan Note (Addendum)
BP: 124/64 mmHg   bp in fair control at this time  No changes needed  Disc lifstyle change with low sodium diet and exercise

## 2013-05-16 NOTE — Progress Notes (Signed)
Subjective:    Patient ID: Margaret Vazquez, female    DOB: 09-15-1935, 77 y.o.   MRN: [id number]  HPI Here for f/u of chronic medical issues  Neuro f/u Seizure tx - gabapentin - and then Dr Pearlean Brownie stopped it (EEG today)  Will have a doppler also  Had a loop recorder  All to keppra  On ticlid - from CVA  (has had 3 strokes) Is finally getting her strength back  Notices R arm - weaker than the L - but getting better  Dexterity in R hand is slt improved - she does exercises and trying to strengthen it  Has had f/u with Dr Pearlean Brownie recently   Is working on stamina - that is taking a while    bp is stable today  No cp or palpitations or headaches or edema  No side effects to medicines  BP Readings from Last 3 Encounters:  05/16/13 124/64  05/11/13 125/64  05/05/13 172/48     Hyperlipidemia Lab Results  Component Value Date   CHOL 184 04/25/2013   CHOL 172 02/15/2013   CHOL 185 10/20/2012   Lab Results  Component Value Date   HDL 45 04/25/2013   HDL 41 02/15/2013   HDL 33.30* 10/20/2012   Lab Results  Component Value Date   LDLCALC 95 04/25/2013   LDLCALC 99 02/15/2013   LDLCALC 122* 10/20/2012   Lab Results  Component Value Date   TRIG 222* 04/25/2013   TRIG 161* 02/15/2013   TRIG 147.0 10/20/2012   Lab Results  Component Value Date   CHOLHDL 4.1 04/25/2013   CHOLHDL 4.2 02/15/2013   CHOLHDL 6 10/20/2012   Lab Results  Component Value Date   LDLDIRECT 136.5 10/20/2011   LDLDIRECT 134.7 08/08/2008   LDLDIRECT 143.0 04/27/2007   trig a little high- was not fasting for this draw  Diabetes Home sugar results - just a bit higher lately  DM diet - is eating healthy Exercise - doing exercises  Symptoms A1C last  Lab Results  Component Value Date   HGBA1C 6.7* 04/25/2013  last one 6.5  Thinks it went up due to stress /recent health problems   No problems with medications  Renal protection on arb Last eye exam  4/14   Has sinus inf Congestion Pressure in L face   Purulent d/c -post nasal drip  No fever  Air fluid levels seen on L on MRI as well   Patient Active Problem List   Diagnosis Date Noted  . Sinusitis, chronic 05/16/2013  . Weakness of right upper extremity 04/26/2013  . CVA (cerebral infarction) 04/24/2013  . Acute ischemic stroke 02/15/2013  . Numbness and tingling of right arm 02/14/2013  . Encounter for Medicare annual wellness exam 10/27/2012  . UTI (urinary tract infection) 10/20/2012  . Tick bite 09/29/2012  . Encopresis 12/15/2011  . Post-menopausal 10/28/2011  . Abnormal urine odor 07/30/2011  . Diarrhea 07/30/2011  . PERSONAL HX COLONIC POLYPS 09/04/2009  . VITAMIN B12 DEFICIENCY 03/06/2009  . UNSPECIFIED ANEMIA 02/13/2009  . ALLERGIC RHINITIS 02/14/2008  . BACK PAIN, LUMBAR 11/03/2007  . HYPERLIPIDEMIA 11/24/2006  . HYPERTENSION, ESSENTIAL NOS 11/24/2006  . DIABETES MELLITUS, TYPE II 10/09/2006  . GERD 10/09/2006  . OVERACTIVE BLADDER 10/09/2006  . INCONTINENCE, URGE 10/09/2006  . SKIN CANCER, HX OF 10/09/2006   Past Medical History  Diagnosis Date  . Hypertension   . Diabetes mellitus     type II  . Hyperlipidemia     myalgias  with Lipitor and Zetia  . GERD (gastroesophageal reflux disease)   . Skin cancer     hx of basal cell/ak's  . Colon polyps 2009  . Allergy history, drug     Aspirin  . Cervical stenosis of spine     With neck pain  . CAD (coronary artery disease) 04/08/11    non obst by cath  . Migraine   . TIA (transient ischemic attack)    Past Surgical History  Procedure Laterality Date  . Appendectomy    . Tubal ligation      BTL  . Breast surgery  1992    breast biopsy  . Eye surgery  2005    tear duct surgery  . Nasal sinus surgery  01/2005  . Cardiac catheterization  1993    LHC: normal coronaries.   . Stress cardiolite  11/1999    Normal/ negative  . Cystoscopy w/ decannulation  03/2000    Normal  . Abd [place]  07/2003    Negative  . Tear duct surgery  2005  . Colonoscopy   12/2007    Adenomatous colon polyps  . Cardiac catheterization  04/07/2011    non obst CAD (Dr Excell Seltzer)  . Tee without cardioversion N/A 02/16/2013    Procedure: TRANSESOPHAGEAL ECHOCARDIOGRAM (TEE);  Surgeon: Wendall Stade, MD;  Location: [facility] ENDOSCOPY;  Service: Cardiovascular;  Laterality: N/A;   History  Substance Use Topics  . Smoking status: Never Smoker   . Smokeless tobacco: Never Used  . Alcohol Use: No   Family History  Problem Relation Age of Onset  . Lung cancer Brother   . Colon cancer Neg Hx   . Skin cancer Daughter   . Diabetes Sister   . Diabetes Brother    Allergies  Allergen Reactions  . Bee Venom Hives, Shortness Of Breath and Swelling  . Nabumetone Anaphylaxis  . Amoxicillin-Pot Clavulanate Hives and Swelling    To lips.  . Aspirin Hives  . Atorvastatin Swelling     joint pain/swelling, inc liver tests  . Clopidogrel Bisulfate Hives  . Codeine Nausea And Vomiting  . Ezetimibe Other (See Comments)     fatigue  . Metformin And Related Other (See Comments)    Diarrhea   . Valsartan Other (See Comments)     fatigue   Current Outpatient Prescriptions on File Prior to Visit  Medication Sig Dispense Refill  . carvedilol (COREG) 6.25 MG tablet Take 1 tablet (6.25 mg total) by mouth 2 (two) times daily with a meal.  60 tablet  11  . diphenhydrAMINE (BENADRYL) 25 MG tablet Take 50 mg by mouth once.      Marland Kitchen EPINEPHrine (EPI-PEN) 0.3 mg/0.3 mL DEVI Inject 0.3 mg into the muscle once.      Marland Kitchen glipiZIDE (GLUCOTROL XL) 5 MG 24 hr tablet Take 1 tablet (5 mg total) by mouth daily.  30 tablet  11  . olmesartan (BENICAR) 40 MG tablet Take 1 tablet (40 mg total) by mouth daily.  30 tablet  11  . ticlopidine (TICLID) 250 MG tablet Take 1 tablet (250 mg total) by mouth 2 (two) times daily with a meal.  60 tablet  4  . [DISCONTINUED] levETIRAcetam (KEPPRA) 250 MG tablet Take 1 tablet (250 mg total) by mouth 2 (two) times daily.  60 tablet  0   No current facility-administered  medications on file prior to visit.    Review of Systems    Review of Systems  Constitutional: Negative for  fever, appetite change,  and unexpected weight change.  ENT pos for congestion and rhinorrhea and purulent nasal d/c Eyes: Negative for pain and visual disturbance.  Respiratory: Negative for cough and shortness of breath.   Cardiovascular: Negative for cp or palpitations    Gastrointestinal: Negative for nausea, diarrhea and constipation.  Genitourinary: Negative for urgency and frequency.  Skin: Negative for pallor or rash   Neurological: Negative for light headedness/speech problems pos for weakness in R hand  Hematological: Negative for adenopathy. Does not bruise/bleed easily.  Psychiatric/Behavioral: Negative for dysphoric mood. The patient is not nervous/anxious.      Objective:   Physical Exam  Constitutional: She appears well-developed and well-nourished. No distress.  obese and well appearing   HENT:  Head: Normocephalic and atraumatic.  Right Ear: External ear normal.  Left Ear: External ear normal.  Mouth/Throat: Oropharynx is clear and moist.  Nares are injected and congested  bilat maxillary sinus tenderness worse on L side  TMs are dull    Eyes: Conjunctivae and EOM are normal. Pupils are equal, round, and reactive to light. No scleral icterus.  Neck: Normal range of motion. Neck supple. No JVD present. Carotid bruit is not present. No thyromegaly present.  Cardiovascular: Normal rate, regular rhythm, normal heart sounds and intact distal pulses.  Exam reveals no gallop.   Pulmonary/Chest: Effort normal and breath sounds normal. No respiratory distress. She has no wheezes. She exhibits no tenderness.  Abdominal: She exhibits no abdominal bruit.  Musculoskeletal: Normal range of motion. She exhibits no edema and no tenderness.  Lymphadenopathy:    She has no cervical adenopathy.  Neurological: She is alert. She has normal reflexes. No cranial nerve deficit.  She exhibits normal muscle tone. Coordination normal.  Mild decrease in R grip strength  Nl gait and balance   Skin: Skin is warm and dry. No rash noted. No erythema. No pallor.  Psychiatric: She has a normal mood and affect.  Cheerful and talkative           Assessment & Plan:

## 2013-05-16 NOTE — Assessment & Plan Note (Signed)
Lab Results  Component Value Date   HGBA1C 6.7* 04/25/2013   This is up a bit - but generally well controlled  May be elevated in light of recent cva/ hosp/ stress as well

## 2013-05-16 NOTE — Patient Instructions (Signed)
For sinus infection -take the levaquin as directed  Watch sugar closely with this medicine - it can make sugar lower  Make sure not to skip meals and eat snacks as needed  Labs are overall stable  Continue follow up with neurology and cardiology  Let me know if sinus symptoms do not improve  Flu shot today

## 2013-05-17 ENCOUNTER — Ambulatory Visit (INDEPENDENT_AMBULATORY_CARE_PROVIDER_SITE_OTHER): Payer: Medicare Other

## 2013-05-17 DIAGNOSIS — I6350 Cerebral infarction due to unspecified occlusion or stenosis of unspecified cerebral artery: Secondary | ICD-10-CM

## 2013-05-25 ENCOUNTER — Encounter: Payer: Self-pay | Admitting: Internal Medicine

## 2013-05-25 ENCOUNTER — Ambulatory Visit (INDEPENDENT_AMBULATORY_CARE_PROVIDER_SITE_OTHER): Payer: Medicare Other | Admitting: *Deleted

## 2013-05-25 DIAGNOSIS — I6350 Cerebral infarction due to unspecified occlusion or stenosis of unspecified cerebral artery: Secondary | ICD-10-CM

## 2013-05-25 DIAGNOSIS — I639 Cerebral infarction, unspecified: Secondary | ICD-10-CM

## 2013-05-25 LAB — MDC_IDC_ENUM_SESS_TYPE_REMOTE

## 2013-05-30 ENCOUNTER — Encounter: Payer: Self-pay | Admitting: *Deleted

## 2013-05-30 ENCOUNTER — Telehealth: Payer: Self-pay | Admitting: Neurology

## 2013-05-30 NOTE — Telephone Encounter (Signed)
Patient had an EEG done on 05/16/2013 and a Doppler on 05/17/2013 - she has not been notified of results from either of these tests.  She was advised by the technician to call our office if she had not received results within 10 days, it has now been 14 days.  Please advise the patient asap.

## 2013-06-06 ENCOUNTER — Other Ambulatory Visit: Payer: Self-pay

## 2013-06-06 DIAGNOSIS — Z1231 Encounter for screening mammogram for malignant neoplasm of breast: Secondary | ICD-10-CM

## 2013-06-07 ENCOUNTER — Encounter: Payer: Self-pay | Admitting: Internal Medicine

## 2013-06-07 ENCOUNTER — Ambulatory Visit (INDEPENDENT_AMBULATORY_CARE_PROVIDER_SITE_OTHER): Payer: Medicare Other | Admitting: Internal Medicine

## 2013-06-07 VITALS — BP 181/84 | HR 65 | Ht 68.0 in | Wt 211.1 lb

## 2013-06-07 DIAGNOSIS — Z959 Presence of cardiac and vascular implant and graft, unspecified: Secondary | ICD-10-CM | POA: Insufficient documentation

## 2013-06-07 DIAGNOSIS — I639 Cerebral infarction, unspecified: Secondary | ICD-10-CM

## 2013-06-07 DIAGNOSIS — I10 Essential (primary) hypertension: Secondary | ICD-10-CM

## 2013-06-07 DIAGNOSIS — R5383 Other fatigue: Secondary | ICD-10-CM | POA: Insufficient documentation

## 2013-06-07 DIAGNOSIS — R5381 Other malaise: Secondary | ICD-10-CM

## 2013-06-07 DIAGNOSIS — I6350 Cerebral infarction due to unspecified occlusion or stenosis of unspecified cerebral artery: Secondary | ICD-10-CM

## 2013-06-07 LAB — MDC_IDC_ENUM_SESS_TYPE_INCLINIC

## 2013-06-07 MED ORDER — OLMESARTAN MEDOXOMIL-HCTZ 40-25 MG PO TABS: 1.0000 | ORAL_TABLET | Freq: Every day | ORAL | Status: DC

## 2013-06-07 NOTE — Assessment & Plan Note (Signed)
Patient profound fatigue I suspect is related to her stroke. She does not have symptoms consistent with sleep apnea

## 2013-06-07 NOTE — Assessment & Plan Note (Signed)
No intercurrent atrial fibrillation 

## 2013-06-07 NOTE — Assessment & Plan Note (Signed)
Blood pressure is poorly controlled. We'll add HCTZ to olmesartan call  arrange for BMet  n a few weeks with PCP

## 2013-06-07 NOTE — Patient Instructions (Addendum)
Your physician has recommended you make the following change in your medication:  1) Stop Benicar 2) Start Benicar HCT daily  Your physician recommends that you return for lab work in: 2 weeks for BMET  Your physician recommends that you schedule a follow-up appointment with your primary care provider.  Your physician wants you to follow-up in: 9 months with Dr. Graciela Husbands.  You will receive a reminder letter in the mail two months in advance. If you don't receive a letter, please call our office to schedule the follow-up appointment.

## 2013-06-07 NOTE — Progress Notes (Signed)
Patient Care Team: Judy Pimple, MD as PCP - General   HPI  Margaret Vazquez is a 77 y.o. female Seen in followup for cryptogenic stroke with loop recorder   Her biggest complaint is fatigue. She is also had 2 intercurrent transient neurological episodes.  Her blood pressure has been difficult to control.  She does not have daytime somnolence and she wakes refreshed in the morning    Past Medical History  Diagnosis Date  . Hypertension   . Diabetes mellitus     type II  . Hyperlipidemia     myalgias with Lipitor and Zetia  . GERD (gastroesophageal reflux disease)   . Skin cancer     hx of basal cell/ak's  . Colon polyps 2009  . Allergy history, drug     Aspirin  . Cervical stenosis of spine     With neck pain  . CAD (coronary artery disease) 04/08/11    non obst by cath  . Migraine   . TIA (transient ischemic attack)     Past Surgical History  Procedure Laterality Date  . Appendectomy    . Tubal ligation      BTL  . Breast surgery  1992    breast biopsy  . Eye surgery  2005    tear duct surgery  . Nasal sinus surgery  01/2005  . Cardiac catheterization  1993    LHC: normal coronaries.   . Stress cardiolite  11/1999    Normal/ negative  . Cystoscopy w/ decannulation  03/2000    Normal  . Abd [place]  07/2003    Negative  . Tear duct surgery  2005  . Colonoscopy  12/2007    Adenomatous colon polyps  . Cardiac catheterization  04/07/2011    non obst CAD (Dr Excell Seltzer)  . Tee without cardioversion N/A 02/16/2013    Procedure: TRANSESOPHAGEAL ECHOCARDIOGRAM (TEE);  Surgeon: Wendall Stade, MD;  Location: [facility] ENDOSCOPY;  Service: Cardiovascular;  Laterality: N/A;    Current Outpatient Prescriptions  Medication Sig Dispense Refill  . carvedilol (COREG) 6.25 MG tablet Take 1 tablet (6.25 mg total) by mouth 2 (two) times daily with a meal.  60 tablet  11  . diphenhydrAMINE (BENADRYL) 25 MG tablet Take 50 mg by mouth once.      Marland Kitchen EPINEPHrine (EPI-PEN) 0.3  mg/0.3 mL DEVI Inject 0.3 mg into the muscle once.      Marland Kitchen glipiZIDE (GLUCOTROL XL) 5 MG 24 hr tablet Take 1 tablet (5 mg total) by mouth daily.  30 tablet  11  . levofloxacin (LEVAQUIN) 500 MG tablet Take 1 tablet (500 mg total) by mouth daily. For sinus infection  10 tablet  0  . olmesartan (BENICAR) 40 MG tablet Take 1 tablet (40 mg total) by mouth daily.  30 tablet  11  . ticlopidine (TICLID) 250 MG tablet Take 1 tablet (250 mg total) by mouth 2 (two) times daily with a meal.  60 tablet  4  . [DISCONTINUED] levETIRAcetam (KEPPRA) 250 MG tablet Take 1 tablet (250 mg total) by mouth 2 (two) times daily.  60 tablet  0   No current facility-administered medications for this visit.    Allergies  Allergen Reactions  . Bee Venom Hives, Shortness Of Breath and Swelling  . Nabumetone Anaphylaxis  . Amoxicillin-Pot Clavulanate Hives and Swelling    To lips.  . Aspirin Hives  . Atorvastatin Swelling     joint pain/swelling, inc liver tests  .  Clopidogrel Bisulfate Hives  . Codeine Nausea And Vomiting  . Ezetimibe Other (See Comments)     fatigue  . Metformin And Related Other (See Comments)    Diarrhea   . Valsartan Other (See Comments)     fatigue    Review of Systems negative except from HPI and PMH  Physical Exam BP 181/84  Pulse 65  Ht 5\' 8"  (1.727 m)  Wt 211 lb 1.9 oz (95.763 kg)  BMI 32.11 kg/m2 Well developed and well nourished in no acute distress HENT normal E scleral and icterus clear Neck Supple JVP flat; carotids brisk and full Clear to ausculation  Regular rate and rhythm, no murmurs gallops or rub Soft with active bowel sounds No clubbing cyanosis no  Edema Alert and oriented, grossly normal motor and sensory function Skin Warm and Dry    Assessment and  Plan

## 2013-06-08 ENCOUNTER — Encounter: Payer: Self-pay | Admitting: Internal Medicine

## 2013-06-21 ENCOUNTER — Other Ambulatory Visit (INDEPENDENT_AMBULATORY_CARE_PROVIDER_SITE_OTHER): Payer: Medicare Other

## 2013-06-21 DIAGNOSIS — I6350 Cerebral infarction due to unspecified occlusion or stenosis of unspecified cerebral artery: Secondary | ICD-10-CM

## 2013-06-21 DIAGNOSIS — R5383 Other fatigue: Secondary | ICD-10-CM

## 2013-06-21 DIAGNOSIS — R5381 Other malaise: Secondary | ICD-10-CM

## 2013-06-21 LAB — BASIC METABOLIC PANEL
BUN: 15 mg/dL (ref 6–23)
CO2: 29 mEq/L (ref 19–32)
Calcium: 8.5 mg/dL (ref 8.4–10.5)
Chloride: 102 mEq/L (ref 96–112)
Creatinine, Ser: 1 mg/dL (ref 0.4–1.2)
GFR: 55.41 mL/min — ABNORMAL LOW (ref >60.00)
Glucose, Bld: 227 mg/dL — ABNORMAL HIGH (ref 70–99)
Potassium: 4 mEq/L (ref 3.5–5.1)
Sodium: 137 mEq/L (ref 135–145)

## 2013-06-24 ENCOUNTER — Ambulatory Visit (INDEPENDENT_AMBULATORY_CARE_PROVIDER_SITE_OTHER): Payer: Medicare Other | Admitting: *Deleted

## 2013-06-24 DIAGNOSIS — I639 Cerebral infarction, unspecified: Secondary | ICD-10-CM

## 2013-06-24 DIAGNOSIS — I6350 Cerebral infarction due to unspecified occlusion or stenosis of unspecified cerebral artery: Secondary | ICD-10-CM

## 2013-07-04 ENCOUNTER — Encounter: Payer: Self-pay | Admitting: Internal Medicine

## 2013-07-05 ENCOUNTER — Telehealth: Payer: Self-pay | Admitting: Internal Medicine

## 2013-07-05 NOTE — Telephone Encounter (Signed)
Follow up    Pt returning our call, she thinks it is for her lab results.  Please give her a call back.

## 2013-07-05 NOTE — Telephone Encounter (Signed)
A user error has taken place: encounter opened in error, closed for administrative reasons. See 12/16 lab for documentation.

## 2013-07-14 ENCOUNTER — Ambulatory Visit
Admission: RE | Admit: 2013-07-14 | Discharge: 2013-07-14 | Disposition: A | Discharge status: Home / Self Care | Payer: Medicare HMO | Source: Ambulatory Visit

## 2013-07-14 DIAGNOSIS — Z1231 Encounter for screening mammogram for malignant neoplasm of breast: Secondary | ICD-10-CM

## 2013-07-18 ENCOUNTER — Encounter: Payer: Self-pay | Admitting: Nurse Practitioner

## 2013-07-18 ENCOUNTER — Telehealth: Payer: Self-pay | Admitting: Neurology

## 2013-07-18 NOTE — Telephone Encounter (Signed)
Called patient to reschedule 08/15/13 appt.  LVM with new appt on 08/19/12 @ 4:00.  Sending letter.

## 2013-07-25 ENCOUNTER — Telehealth: Payer: Self-pay | Admitting: Neurology

## 2013-07-25 NOTE — Telephone Encounter (Signed)
Patient called to state that she googled Ticlid since she has been having trouble finding this medication and she states it has been discontinued in the [place]. Please call the patient to advise.

## 2013-07-25 NOTE — Telephone Encounter (Signed)
As I understand she has had trouble in the past with aspirin and clopidogrel as well as Aggrenox and hence Ticlid as his only medication that works for her. It may be difficult to obtain this medication in the [place] and she may have to look at options like [place] or elsewhere if it is available in the generic form.

## 2013-07-25 NOTE — Telephone Encounter (Signed)
Please advise 

## 2013-07-26 ENCOUNTER — Ambulatory Visit: Payer: Medicare HMO | Admitting: *Deleted

## 2013-07-27 LAB — MDC_IDC_ENUM_SESS_TYPE_REMOTE

## 2013-07-28 NOTE — Telephone Encounter (Signed)
Patient can go online to [email or web address] or contact them at [phone] to check the cost/availablity of this medication.  I called the patient back.  Got no answer.  Left message with contact info for [facility].

## 2013-07-29 NOTE — Telephone Encounter (Signed)
I spoke with pt.  She has spoken to [facility].  She would like to proceed.  They need called in prescription to [phone], acct # [id number], order # [id number] , if you need those #).  For ticlid 250mg  po bid #180 and 3 refills (90 day supply).  She would like call back when done.

## 2013-07-29 NOTE — Telephone Encounter (Signed)
I called [facility].  Spoke with [place].  She said all pharmacists were busy and asked me to leave the verbal order on the voicemail, which I did.  I asked them to call us back if there were any questions.

## 2013-08-10 LAB — MDC_IDC_ENUM_SESS_TYPE_REMOTE

## 2013-08-15 ENCOUNTER — Ambulatory Visit: Payer: Medicare Other | Admitting: Nurse Practitioner

## 2013-08-19 ENCOUNTER — Encounter: Payer: Self-pay | Admitting: Nurse Practitioner

## 2013-08-19 ENCOUNTER — Encounter: Payer: Self-pay | Admitting: Internal Medicine

## 2013-08-19 ENCOUNTER — Encounter (INDEPENDENT_AMBULATORY_CARE_PROVIDER_SITE_OTHER): Payer: Self-pay

## 2013-08-19 ENCOUNTER — Ambulatory Visit (INDEPENDENT_AMBULATORY_CARE_PROVIDER_SITE_OTHER): Payer: Commercial Managed Care - HMO | Admitting: Nurse Practitioner

## 2013-08-19 VITALS — BP 106/58 | HR 69 | Wt 206.0 lb

## 2013-08-19 DIAGNOSIS — I6350 Cerebral infarction due to unspecified occlusion or stenosis of unspecified cerebral artery: Secondary | ICD-10-CM

## 2013-08-19 DIAGNOSIS — I639 Cerebral infarction, unspecified: Secondary | ICD-10-CM

## 2013-08-19 NOTE — Progress Notes (Signed)
PATIENT: Margaret Vazquez DOB: 1935-07-12   REASON FOR VISIT: follow up for stroke HISTORY FROM: patient  HISTORY OF PRESENT ILLNESS: 05/11/13 (PS): Ms Morand is a 15 year Caucasian lady who is seen for followup of recent hospital consults in August and October 2014. She was initially seen on 02/17/39 and when she was admitted for evaluation for multiple episodes of transient right arm numbness and tingling. Her symptoms got worse on 02/14/13 and she had difficulty eating with a fork as well as writing. MRI scan the brain showed multifocal infarcts in the left frontal and parietal MCA territory an MRI of the brain showed severe proximal left M1 stenosis. Transthoracic echocardiogram showed normal ejection fraction.  Trans esophageal echocardiogram showed no cardiac source of embolism or PFO. Hemoglobin A1c was 6.5%. Total cholesterol was 172, triglycerides 161, HDL 41 and LDL 99 mg percent. She was started on aspirin and Plavix for stroke prevention and advised aggressive risk factor control. She also had a loop recorder implanted for atrial fibrillation detection. She subsequently returned on 04/25/39 and with transient episode of right upper extremity no weakness in addition to stiffening and twisting of her right arm and inability to control it. She was evaluated for possible sensory seizures with EEG, which was negative but she was started on low-dose Keppra 250 twice a day. She states she had trouble tolerating Keppra and developed itching as well as rash and was seen on the emergency room on 05/05/13 where Keppra was discontinued and she was switched to gabapentin. She always states that she is having trouble tolerating it and complains of sleepiness on it even though she is taking only 1 tablet daily. She had a CT head on 1019/14 which was stable. She complains of tiredness and decreased stamina. She's noticed that she still has some diminished fine motor skills in the right hand. She states her  blood pressure and sugars have been under good control. She remains on ticlopidine for secondary stroke prevention/she has an aspirin allergy and complained of feeling lethargic while taking Plavix in the past. She has seen me in the past for cervicogenic muscle tension headaches and tremors and was last in the office in 2013.   UPDATE 08/19/13 (LL):  Ms. Jacot returns for revisit, at last visit she discontinued Gabapentin and has no further episodes of her left arm "drawing up."  She endorses severe fatigue, always feeling tired and sometimes feeling a pressure sensation between her shoulder blades which makes her feel like she needs to sit down.  She has had no new neurovascular symptoms.  She states her blood pressure and sugars have been under good control. She remains on ticlopidine for secondary stroke prevention.  ROS:  14 system review of systems is positive for:  fatigue, tiredness   ALLERGIES: Allergies  Allergen Reactions  . Bee Venom Hives, Shortness Of Breath and Swelling  . Nabumetone Anaphylaxis  . Amoxicillin-Pot Clavulanate Hives and Swelling    To lips.  . Aspirin Hives  . Atorvastatin Swelling     joint pain/swelling, inc liver tests  . Clopidogrel Bisulfate Hives  . Codeine Nausea And Vomiting  . Ezetimibe Other (See Comments)     fatigue  . Metformin And Related Other (See Comments)    Diarrhea   . Valsartan Other (See Comments)     fatigue    HOME MEDICATIONS: Outpatient Prescriptions Prior to Visit  Medication Sig Dispense Refill  . carvedilol (COREG) 6.25 MG tablet Take 1 tablet (6.25  mg total) by mouth 2 (two) times daily with a meal.  60 tablet  11  . diphenhydrAMINE (BENADRYL) 25 MG tablet Take 50 mg by mouth once.      Marland Kitchen EPINEPHrine (EPI-PEN) 0.3 mg/0.3 mL DEVI Inject 0.3 mg into the muscle once.      Marland Kitchen glipiZIDE (GLUCOTROL XL) 5 MG 24 hr tablet Take 1 tablet (5 mg total) by mouth daily.  30 tablet  11  . levofloxacin (LEVAQUIN) 500 MG tablet Take 1  tablet (500 mg total) by mouth daily. For sinus infection  10 tablet  0  . olmesartan-hydrochlorothiazide (BENICAR HCT) 40-25 MG per tablet Take 1 tablet by mouth daily.  30 tablet  11  . ticlopidine (TICLID) 250 MG tablet Take 1 tablet (250 mg total) by mouth 2 (two) times daily with a meal.  60 tablet  4   No facility-administered medications prior to visit.    PAST MEDICAL HISTORY: Past Medical History  Diagnosis Date  . Hypertension   . Diabetes mellitus     type II  . Hyperlipidemia     myalgias with Lipitor and Zetia  . GERD (gastroesophageal reflux disease)   . Skin cancer     hx of basal cell/ak's  . Colon polyps 2009  . Allergy history, drug     Aspirin  . Cervical stenosis of spine     With neck pain  . CAD (coronary artery disease) 04/08/11    non obst by cath  . Migraine   . TIA (transient ischemic attack)     PAST SURGICAL HISTORY: Past Surgical History  Procedure Laterality Date  . Appendectomy    . Tubal ligation      BTL  . Breast surgery  1992    breast biopsy  . Eye surgery  2005    tear duct surgery  . Nasal sinus surgery  01/2005  . Cardiac catheterization  1993    LHC: normal coronaries.   . Stress cardiolite  11/1999    Normal/ negative  . Cystoscopy w/ decannulation  03/2000    Normal  . Abd [place]  07/2003    Negative  . Tear duct surgery  2005  . Colonoscopy  12/2007    Adenomatous colon polyps  . Cardiac catheterization  04/07/2011    non obst CAD (Dr Burt Knack)  . Tee without cardioversion N/A 02/16/2013    Procedure: TRANSESOPHAGEAL ECHOCARDIOGRAM (TEE);  Surgeon: Josue Hector, MD;  Location: [facility] ENDOSCOPY;  Service: Cardiovascular;  Laterality: N/A;    FAMILY HISTORY: Family History  Problem Relation Age of Onset  . Lung cancer Brother   . Colon cancer Neg Hx   . Skin cancer Daughter   . Diabetes Sister   . Diabetes Brother     SOCIAL HISTORY: History   Social History  . Marital Status: Married    Spouse Name: N/A    Number  of Children: 3  . Years of Education: N/A   Occupational History  . Retired    Social History Main Topics  . Smoking status: Never Smoker   . Smokeless tobacco: Never Used  . Alcohol Use: No  . Drug Use: No  . Sexual Activity: No   Other Topics Concern  . Not on file   Social History Narrative   3 children   Does not drink caffeinated beverages   Cares for SIL with dementia     PHYSICAL EXAM  Filed Vitals:   08/19/13 1619  BP: 106/58  Pulse: 69  Weight: 206 lb (93.441 kg)   Body mass index is 31.33 kg/(m^2).  Physical Exam  General: well developed, well nourished elderly Caucasian lady, seated, in no evident distress  Head: head normocephalic and atraumatic. Orohparynx benign  Neck: supple with no carotid or supraclavicular bruits  Cardiovascular: regular rate and rhythm, no murmurs  Musculoskeletal: no deformity  Skin: no rash/petichiae  Vascular: Normal pulses all extremities   Neurologic Exam  Mental Status: Awake and fully alert. Oriented to place and time. Recent and remote memory intact. Attention span, concentration and fund of knowledge appropriate. Mood and affect appropriate.  Cranial Nerves: Fundoscopic exam reveals sharp disc margins. Pupils equal, briskly reactive to light. Extraocular movements full without nystagmus. Visual fields full to confrontation. Hearing intact. Facial sensation intact. Face, tongue, palate moves normally and symmetrically.  Motor: Normal bulk and tone. Normal strength in all tested extremity muscles. Diminished fine finger movements on the right. Orbits left-to-right upper extremity. Minimal weakness of right grip muscles only.  Sensory.: intact to touch and pinprick and vibratory sensation. But has subjective paresthesias in the right hand  Coordination: Rapid alternating movements normal in all extremities. Finger-to-nose and heel-to-shin performed accurately bilaterally.  Gait and Station: Arises from chair without difficulty.  Stance is normal. Gait demonstrates normal stride length and balance . Not able to heel, toe and tandem walk without difficulty.  Reflexes: 1+ and symmetric. Toes downgoing.    ASSESSMENT AND PLAN 80 year lady with left middle cerebral artery branch infarcts in August 2014 secondary to severe proximal left M1 stenosis who has had recurrent episodes of right arm paresthesias with one episode in October 2014 with involuntary twisting and lack of control and shaking of the hand raising concern for simple partial seizure. She was started on Keppra but did not tolerate it well due to hives and itching and was changed to gabapentin which also she is having trouble tolerating due to sleepiness. Gabapentin was discontinued at last visit and she has had no recurrent episodes.   PLAN:  I had a long discussion with the patient regarding her symptoms and answered questions. Follow up with Dr. Glori Bickers for your fatigue, ask that she check an anemia panel.  Continue Ticlodipine for stroke prevention and maintain strict control of hypertension with blood pressure goal below 130/90, diabetes with hemoglobin A1c goal below 6.5% and lipids with LDL cholesterol goal below 100 mg/dL and return for followup in 6 months with NP or call earlier if necessary.  Philmore Pali, MSN, NP-C 08/19/2013, 4:46 PM [facility] [address] [place], [place] [address] [phone]  Note: This document was prepared with digital dictation and possible smart phrase technology. Any transcriptional errors that result from this process are unintentional.

## 2013-08-19 NOTE — Patient Instructions (Addendum)
PLAN:  Follow up with Dr. Glori Bickers for your fatigue, ask that she check an anemia panel.  Continue Ticlodipine for stroke prevention and maintain strict control of hypertension with blood pressure goal below 130/90, diabetes with hemoglobin A1c goal below 6.5% and lipids with LDL cholesterol goal below 100 mg/dL.  and return for followup in 6 months with NP or call earlier if necessary.

## 2013-08-22 ENCOUNTER — Emergency Department (HOSPITAL_COMMUNITY)
Admission: EM | Admit: 2013-08-22 | Discharge: 2013-08-22 | Disposition: A | Discharge status: Home / Self Care | Payer: Medicare HMO | Attending: Emergency Medicine | Admitting: Emergency Medicine

## 2013-08-22 ENCOUNTER — Encounter (HOSPITAL_COMMUNITY): Payer: Self-pay | Admitting: Emergency Medicine

## 2013-08-22 ENCOUNTER — Telehealth: Payer: Self-pay | Admitting: Family Medicine

## 2013-08-22 ENCOUNTER — Emergency Department (HOSPITAL_COMMUNITY): Payer: Medicare HMO

## 2013-08-22 DIAGNOSIS — Z9889 Other specified postprocedural states: Secondary | ICD-10-CM | POA: Insufficient documentation

## 2013-08-22 DIAGNOSIS — I10 Essential (primary) hypertension: Secondary | ICD-10-CM | POA: Insufficient documentation

## 2013-08-22 DIAGNOSIS — Z8719 Personal history of other diseases of the digestive system: Secondary | ICD-10-CM | POA: Insufficient documentation

## 2013-08-22 DIAGNOSIS — I2510 Atherosclerotic heart disease of native coronary artery without angina pectoris: Secondary | ICD-10-CM | POA: Insufficient documentation

## 2013-08-22 DIAGNOSIS — Z7901 Long term (current) use of anticoagulants: Secondary | ICD-10-CM | POA: Insufficient documentation

## 2013-08-22 DIAGNOSIS — Z860100 Personal history of colon polyps, unspecified: Secondary | ICD-10-CM | POA: Insufficient documentation

## 2013-08-22 DIAGNOSIS — Z85828 Personal history of other malignant neoplasm of skin: Secondary | ICD-10-CM | POA: Insufficient documentation

## 2013-08-22 DIAGNOSIS — Z8673 Personal history of transient ischemic attack (TIA), and cerebral infarction without residual deficits: Secondary | ICD-10-CM | POA: Insufficient documentation

## 2013-08-22 DIAGNOSIS — Z8739 Personal history of other diseases of the musculoskeletal system and connective tissue: Secondary | ICD-10-CM | POA: Insufficient documentation

## 2013-08-22 DIAGNOSIS — E119 Type 2 diabetes mellitus without complications: Secondary | ICD-10-CM | POA: Insufficient documentation

## 2013-08-22 DIAGNOSIS — Z79899 Other long term (current) drug therapy: Secondary | ICD-10-CM | POA: Insufficient documentation

## 2013-08-22 DIAGNOSIS — R0789 Other chest pain: Secondary | ICD-10-CM | POA: Insufficient documentation

## 2013-08-22 DIAGNOSIS — R079 Chest pain, unspecified: Secondary | ICD-10-CM

## 2013-08-22 HISTORY — DX: Cerebral infarction, unspecified: I63.9

## 2013-08-22 LAB — CBC
HCT: 35.9 % — ABNORMAL LOW (ref 36.0–46.0)
Hemoglobin: 12.6 g/dL (ref 12.0–15.0)
MCH: 33 pg (ref 26.0–34.0)
MCHC: 35.1 g/dL (ref 30.0–36.0)
MCV: 94 fL (ref 78.0–100.0)
Platelets: 226 10*3/uL (ref 150–400)
RBC: 3.82 MIL/uL — ABNORMAL LOW (ref 3.87–5.11)
RDW: 12.7 % (ref 11.5–15.5)
WBC: 6.2 10*3/uL (ref 4.0–10.5)

## 2013-08-22 LAB — BASIC METABOLIC PANEL
BUN: 21 mg/dL (ref 6–23)
CO2: 26 mEq/L (ref 19–32)
Calcium: 9.1 mg/dL (ref 8.4–10.5)
Chloride: 102 mEq/L (ref 96–112)
Creatinine, Ser: 1.03 mg/dL (ref 0.50–1.10)
GFR calc Af Amer: 60 mL/min — ABNORMAL LOW (ref >90)
GFR calc non Af Amer: 52 mL/min — ABNORMAL LOW (ref >90)
Glucose, Bld: 170 mg/dL — ABNORMAL HIGH (ref 70–99)
Potassium: 4.3 mEq/L (ref 3.7–5.3)
Sodium: 139 mEq/L (ref 137–147)

## 2013-08-22 LAB — POCT I-STAT TROPONIN I: Troponin i, poc: 0.01 ng/mL (ref 0.00–0.08)

## 2013-08-22 NOTE — Discharge Instructions (Signed)
Chest Pain (Nonspecific) °It is often hard to give a specific diagnosis for the cause of chest pain. There is always a chance that your pain could be related to something serious, such as a heart attack or a blood clot in the lungs. You need to follow up with your caregiver for further evaluation. °CAUSES  °· Heartburn. °· Pneumonia or bronchitis. °· Anxiety or stress. °· Inflammation around your heart (pericarditis) or lung (pleuritis or pleurisy). °· A blood clot in the lung. °· A collapsed lung (pneumothorax). It can develop suddenly on its own (spontaneous pneumothorax) or from injury (trauma) to the chest. °· Shingles infection (herpes zoster virus). °The chest wall is composed of bones, muscles, and cartilage. Any of these can be the source of the pain. °· The bones can be bruised by injury. °· The muscles or cartilage can be strained by coughing or overwork. °· The cartilage can be affected by inflammation and become sore (costochondritis). °DIAGNOSIS  °Lab tests or other studies, such as X-rays, electrocardiography, stress testing, or cardiac imaging, may be needed to find the cause of your pain.  °TREATMENT  °· Treatment depends on what may be causing your chest pain. Treatment may include: °· Acid blockers for heartburn. °· Anti-inflammatory medicine. °· Pain medicine for inflammatory conditions. °· Antibiotics if an infection is present. °· You may be advised to change lifestyle habits. This includes stopping smoking and avoiding alcohol, caffeine, and chocolate. °· You may be advised to keep your head raised (elevated) when sleeping. This reduces the chance of acid going backward from your stomach into your esophagus. °· Most of the time, nonspecific chest pain will improve within 2 to 3 days with rest and mild pain medicine. °HOME CARE INSTRUCTIONS  °· If antibiotics were prescribed, take your antibiotics as directed. Finish them even if you start to feel better. °· For the next few days, avoid physical  activities that bring on chest pain. Continue physical activities as directed. °· Do not smoke. °· Avoid drinking alcohol. °· Only take over-the-counter or prescription medicine for pain, discomfort, or fever as directed by your caregiver. °· Follow your caregiver's suggestions for further testing if your chest pain does not go away. °· Keep any follow-up appointments you made. If you do not go to an appointment, you could develop lasting (chronic) problems with pain. If there is any problem keeping an appointment, you must call to reschedule. °SEEK MEDICAL CARE IF:  °· You think you are having problems from the medicine you are taking. Read your medicine instructions carefully. °· Your chest pain does not go away, even after treatment. °· You develop a rash with blisters on your chest. °SEEK IMMEDIATE MEDICAL CARE IF:  °· You have increased chest pain or pain that spreads to your arm, neck, jaw, back, or abdomen. °· You develop shortness of breath, an increasing cough, or you are coughing up blood. °· You have severe back or abdominal pain, feel nauseous, or vomit. °· You develop severe weakness, fainting, or chills. °· You have a fever. °THIS IS AN EMERGENCY. Do not wait to see if the pain will go away. Get medical help at once. Call your local emergency services (911 in [place] Do not drive yourself to the hospital. °MAKE SURE YOU:  °· Understand these instructions. °· Will watch your condition. °· Will get help right away if you are not doing well or get worse. °Document Released: 04/02/2005 Document Revised: 09/15/2011 Document Reviewed: 01/27/2008 °ExitCare® Patient Information ©2014 [employer]   LL[employer]. ° °

## 2013-08-22 NOTE — Telephone Encounter (Signed)
agree

## 2013-08-22 NOTE — Telephone Encounter (Signed)
Patient Information:  Caller Name: [place]  Phone: [phone]  Patient: Margaret, Vazquez  Gender: Female  DOB: Apr 10, 1936  Age: 78 Years  PCP: Loura Pardon [facility])  Office Follow Up:  Does the office need to follow up with this patient?: No  Instructions For The Office: N/A  RN Note:  Mulfiple very brief episodes of chest pain 4-5 times this morning. No chest pain present now. Pain lasts seconds. Pain at site of implanted "loop" in left chest area. Called office regarding ED or office with MD approval disposition. Per [place]/office assistant, instructed to go to ED now.  Symptoms  Reason For Call & Symptoms: Episodes of brief sharp chest pain. Has " loop recorder".  FBS not tested.  Reviewed Health History In EMR: Yes  Reviewed Medications In EMR: Yes  Reviewed Allergies In EMR: Yes  Reviewed Surgeries / Procedures: Yes  Date of Onset of Symptoms: 08/21/2013  Guideline(s) Used:  Chest Pain  Disposition Per Guideline:   Go to ED Now (or to Office with PCP Approval)  Reason For Disposition Reached:   Intermittent chest pain and pain has been increasing in severity or frequency  Advice Given:  Fleeting Chest Pain:  Fleeting chest pains that last only a few seconds and then go away are generally not serious. They may be from pinched muscles or nerves in your chest wall.  Call Back If:  Severe chest pain  Constant chest pain lasting longer than 5 minutes  Difficulty breathing  Fever  You become worse.  RN Overrode Recommendation:  Go To ED  Per office instrution, advised to go to ED now.

## 2013-08-22 NOTE — ED Notes (Signed)
Pt is here with left sided chest pain intermittent since last nite.  Pt reports that she gets up and starts working and then develops bad pain in her mid back that makes her stop.  Has loop recorder to help find out cause of mini strokes

## 2013-08-22 NOTE — ED Provider Notes (Signed)
CSN: [id number]     Arrival date & time 08/22/13  1211 History   First MD Initiated Contact with Patient 08/22/13 1235     Chief Complaint  Patient presents with  . Chest Pain      Patient is a 78 y.o. female presenting with chest pain. The history is provided by the patient.  Chest Pain Pain location:  L chest Pain quality: sharp   Pain radiates to:  Does not radiate Pain radiates to the back: no   Pain severity:  Mild Onset quality:  Sudden Duration: 1 second. Timing:  Intermittent Progression:  Resolved Chronicity:  New Relieved by:  None tried Worsened by:  Nothing tried Associated symptoms: no abdominal pain, no diaphoresis, no dizziness, no shortness of breath, no syncope, not vomiting and no weakness    Pt reports since last night she had multiple episodes of left sided CP - it is one area, sharp and lasts about one second.  No associated sob/weakness/diaphoresis Her last episode of pain was over an hour ago She has never had this pain before  She also reports recent "back pressure" whenever she walks, though this has not occurred in past 24 hours  Past Medical History  Diagnosis Date  . Hypertension   . Diabetes mellitus     type II  . Hyperlipidemia     myalgias with Lipitor and Zetia  . GERD (gastroesophageal reflux disease)   . Skin cancer     hx of basal cell/ak's  . Colon polyps 2009  . Allergy history, drug     Aspirin  . Cervical stenosis of spine     With neck pain  . CAD (coronary artery disease) 04/08/11    non obst by cath  . Migraine   . TIA (transient ischemic attack)   . Stroke    Past Surgical History  Procedure Laterality Date  . Appendectomy    . Tubal ligation      BTL  . Breast surgery  1992    breast biopsy  . Eye surgery  2005    tear duct surgery  . Nasal sinus surgery  01/2005  . Cardiac catheterization  1993    LHC: normal coronaries.   . Stress cardiolite  11/1999    Normal/ negative  . Cystoscopy w/ decannulation   03/2000    Normal  . Abd [place]  07/2003    Negative  . Tear duct surgery  2005  . Colonoscopy  12/2007    Adenomatous colon polyps  . Cardiac catheterization  04/07/2011    non obst CAD (Dr Burt Knack)  . Tee without cardioversion N/A 02/16/2013    Procedure: TRANSESOPHAGEAL ECHOCARDIOGRAM (TEE);  Surgeon: Josue Hector, MD;  Location: [facility] ENDOSCOPY;  Service: Cardiovascular;  Laterality: N/A;   Family History  Problem Relation Age of Onset  . Lung cancer Brother   . Colon cancer Neg Hx   . Skin cancer Daughter   . Diabetes Sister   . Diabetes Brother    History  Substance Use Topics  . Smoking status: Never Smoker   . Smokeless tobacco: Never Used  . Alcohol Use: No   OB History   Grav Para Term Preterm Abortions TAB SAB Ect Mult Living                 Review of Systems  Constitutional: Negative for diaphoresis.  Respiratory: Negative for shortness of breath.   Cardiovascular: Positive for chest pain. Negative for syncope.  Gastrointestinal: Negative  for vomiting and abdominal pain.  Neurological: Negative for dizziness and weakness.  All other systems reviewed and are negative.      Allergies  Bee venom; Nabumetone; Amoxicillin-pot clavulanate; Aspirin; Atorvastatin; Clopidogrel bisulfate; Codeine; Ezetimibe; Metformin and related; and Valsartan  Home Medications   Current Outpatient Rx  Name  Route  Sig  Dispense  Refill  . carvedilol (COREG) 6.25 MG tablet   Oral   Take 1 tablet (6.25 mg total) by mouth 2 (two) times daily with a meal.   60 tablet   11   . diphenhydrAMINE (BENADRYL) 25 MG tablet   Oral   Take 50 mg by mouth every 8 (eight) hours as needed for allergies.          Marland Kitchen EPINEPHrine (EPI-PEN) 0.3 mg/0.3 mL DEVI   Intramuscular   Inject 0.3 mg into the muscle daily as needed (allergic reaction).          Marland Kitchen glipiZIDE (GLUCOTROL XL) 5 MG 24 hr tablet   Oral   Take 1 tablet (5 mg total) by mouth daily.   30 tablet   11   .  olmesartan-hydrochlorothiazide (BENICAR HCT) 40-25 MG per tablet   Oral   Take 1 tablet by mouth daily.   30 tablet   11   . ticlopidine (TICLID) 250 MG tablet   Oral   Take 1 tablet (250 mg total) by mouth 2 (two) times daily with a meal.   60 tablet   4    BP 132/56  Pulse 68  Temp(Src) 98.2 F (36.8 C) (Oral)  Resp 18  SpO2 100% Physical Exam CONSTITUTIONAL: Well developed/well nourished HEAD: Normocephalic/atraumatic EYES: EOMI/PERRL ENMT: Mucous membranes moist NECK: supple no meningeal signs SPINE:entire spine nontender CV: S1/S2 noted, no murmurs/rubs/gallops noted LUNGS: Lungs are clear to auscultation bilaterally, no apparent distress ABDOMEN: soft, nontender, no rebound or guarding GU:no cva tenderness NEURO: Pt is awake/alert, moves all extremitiesx4 EXTREMITIES: pulses normal, full ROM, no LE edema/pain SKIN: warm, color normal PSYCH: no abnormalities of mood noted  ED Course  Procedures   Pt with h/o atypical CP - pinpoint pain, last one second, no associated symptoms My suspicion for ACS is very low.  (last cath in 2012 showed mild diffuse CAD) She would like to go home. A call to cardiology was placed and plan is for outpatient followup within one week I don't feel she needs admission for further evaluation of her CP I doubt PE/Dissection at this time As for her "back pressure" this can be further evaluated as outpatient We discussed strict return precautions Pt stable for d/c home Labs Review Labs Reviewed  CBC - Abnormal; Notable for the following:    RBC 3.82 (*)    HCT 35.9 (*)    All other components within normal limits  BASIC METABOLIC PANEL - Abnormal; Notable for the following:    Glucose, Bld 170 (*)    GFR calc non Af Amer 52 (*)    GFR calc Af Amer 60 (*)    All other components within normal limits  POCT I-STAT TROPONIN I   Imaging Review Dg Chest Portable 1 View  08/22/2013   CLINICAL DATA:  Left-sided chest pain and  discomfort  EXAM: PORTABLE CHEST - 1 VIEW  COMPARISON:  DG CHEST 2 VIEW dated 04/24/2013  FINDINGS: Heart size upper normal and stable. Vascular pattern normal. Lungs clear.  IMPRESSION: No active disease.   Electronically Signed   By: Elodia Florence.D.  On: 08/22/2013 13:49    EKG Interpretation    Date/Time:  Monday August 22 2013 12:17:28 EST Ventricular Rate:  67 PR Interval:  142 QRS Duration: 92 QT Interval:  402 QTC Calculation: 424 R Axis:   33 Text Interpretation:  Normal sinus rhythm Septal infarct , age undetermined Abnormal ECG Confirmed by Christy Gentles  MD, [place] [phone]) on 08/22/2013 12:42:26 PM            MDM   Final diagnoses:  Chest pain    Nursing notes including past medical history and social history reviewed and considered in documentation xrays reviewed and considered Labs/vital reviewed and considered Previous records reviewed and considered - recent call notes reviewed     Sharyon Cable, MD 08/22/13 1626

## 2013-08-22 NOTE — Progress Notes (Signed)
Received request from ER for f/u appt next available. I have left a message on our office's scheduling voicemail requesting a follow-up appointment, and our office will call the patient with this appointment. Marie Borowski PA-C

## 2013-08-26 ENCOUNTER — Emergency Department (HOSPITAL_COMMUNITY)
Admission: EM | Admit: 2013-08-26 | Discharge: 2013-08-26 | Disposition: A | Discharge status: Home / Self Care | Payer: Medicare HMO | Attending: Emergency Medicine | Admitting: Emergency Medicine

## 2013-08-26 ENCOUNTER — Emergency Department (HOSPITAL_COMMUNITY): Payer: Medicare HMO

## 2013-08-26 ENCOUNTER — Ambulatory Visit (INDEPENDENT_AMBULATORY_CARE_PROVIDER_SITE_OTHER): Payer: Medicare HMO | Admitting: *Deleted

## 2013-08-26 ENCOUNTER — Encounter (HOSPITAL_COMMUNITY): Payer: Self-pay | Admitting: Emergency Medicine

## 2013-08-26 DIAGNOSIS — I639 Cerebral infarction, unspecified: Secondary | ICD-10-CM

## 2013-08-26 DIAGNOSIS — I6350 Cerebral infarction due to unspecified occlusion or stenosis of unspecified cerebral artery: Secondary | ICD-10-CM

## 2013-08-26 DIAGNOSIS — I10 Essential (primary) hypertension: Secondary | ICD-10-CM | POA: Insufficient documentation

## 2013-08-26 DIAGNOSIS — E119 Type 2 diabetes mellitus without complications: Secondary | ICD-10-CM | POA: Insufficient documentation

## 2013-08-26 DIAGNOSIS — I2510 Atherosclerotic heart disease of native coronary artery without angina pectoris: Secondary | ICD-10-CM | POA: Insufficient documentation

## 2013-08-26 DIAGNOSIS — Z7901 Long term (current) use of anticoagulants: Secondary | ICD-10-CM | POA: Insufficient documentation

## 2013-08-26 DIAGNOSIS — R519 Headache, unspecified: Secondary | ICD-10-CM

## 2013-08-26 DIAGNOSIS — Z8673 Personal history of transient ischemic attack (TIA), and cerebral infarction without residual deficits: Secondary | ICD-10-CM | POA: Insufficient documentation

## 2013-08-26 DIAGNOSIS — Z79899 Other long term (current) drug therapy: Secondary | ICD-10-CM | POA: Insufficient documentation

## 2013-08-26 DIAGNOSIS — W010XXA Fall on same level from slipping, tripping and stumbling without subsequent striking against object, initial encounter: Secondary | ICD-10-CM | POA: Insufficient documentation

## 2013-08-26 DIAGNOSIS — S0990XA Unspecified injury of head, initial encounter: Secondary | ICD-10-CM | POA: Insufficient documentation

## 2013-08-26 DIAGNOSIS — Z85828 Personal history of other malignant neoplasm of skin: Secondary | ICD-10-CM | POA: Insufficient documentation

## 2013-08-26 DIAGNOSIS — K219 Gastro-esophageal reflux disease without esophagitis: Secondary | ICD-10-CM | POA: Insufficient documentation

## 2013-08-26 DIAGNOSIS — W19XXXA Unspecified fall, initial encounter: Secondary | ICD-10-CM

## 2013-08-26 DIAGNOSIS — Y9389 Activity, other specified: Secondary | ICD-10-CM | POA: Insufficient documentation

## 2013-08-26 DIAGNOSIS — Z9889 Other specified postprocedural states: Secondary | ICD-10-CM | POA: Insufficient documentation

## 2013-08-26 DIAGNOSIS — R51 Headache: Secondary | ICD-10-CM

## 2013-08-26 DIAGNOSIS — Y92009 Unspecified place in unspecified non-institutional (private) residence as the place of occurrence of the external cause: Secondary | ICD-10-CM | POA: Insufficient documentation

## 2013-08-26 LAB — MDC_IDC_ENUM_SESS_TYPE_REMOTE

## 2013-08-26 MED ORDER — ACETAMINOPHEN 500 MG PO TABS
1000.0000 mg | ORAL_TABLET | Freq: Once | ORAL | Status: AC
  Administered 2013-08-26: 1000 mg via ORAL
  Filled 2013-08-26: qty 2

## 2013-08-26 NOTE — ED Notes (Signed)
Per EMS - pt coming from home. Pt was getting out of car, there was a lot of ice where she went to stand up, pt slipped and fell. Pt c/o pain to posterior head, where there is swelling. Pt on blood thinner, Triclopidine. Denies neck pain, passed spinal test. BP 160/90 HR 76, RR 16, 98% room air.

## 2013-08-26 NOTE — Discharge Instructions (Signed)

## 2013-08-26 NOTE — ED Provider Notes (Signed)
CSN: [id number]     Arrival date & time 08/26/13  1622 History   First MD Initiated Contact with Patient 08/26/13 1622     Chief Complaint  Patient presents with  . Fall     (Consider location/radiation/quality/duration/timing/severity/associated sxs/prior Treatment) HPI Comments: Slipped on the ice at home. Hit posterior head. No nausea, vomiting, LOC. In pain now.  Patient is a 78 y.o. female presenting with fall. The history is provided by the patient.  Fall This is a new problem. The current episode started 1 to 2 hours ago. Episode frequency: once. The problem has not changed since onset.Associated symptoms include headaches. Pertinent negatives include no chest pain, no abdominal pain and no shortness of breath. Nothing aggravates the symptoms. She has tried nothing for the symptoms.    Past Medical History  Diagnosis Date  . Hypertension   . Diabetes mellitus     type II  . Hyperlipidemia     myalgias with Lipitor and Zetia  . GERD (gastroesophageal reflux disease)   . Skin cancer     hx of basal cell/ak's  . Colon polyps 2009  . Allergy history, drug     Aspirin  . Cervical stenosis of spine     With neck pain  . CAD (coronary artery disease) 04/08/11    non obst by cath  . Migraine   . TIA (transient ischemic attack)   . Stroke    Past Surgical History  Procedure Laterality Date  . Appendectomy    . Tubal ligation      BTL  . Breast surgery  1992    breast biopsy  . Eye surgery  2005    tear duct surgery  . Nasal sinus surgery  01/2005  . Cardiac catheterization  1993    LHC: normal coronaries.   . Stress cardiolite  11/1999    Normal/ negative  . Cystoscopy w/ decannulation  03/2000    Normal  . Abd [place]  07/2003    Negative  . Tear duct surgery  2005  . Colonoscopy  12/2007    Adenomatous colon polyps  . Cardiac catheterization  04/07/2011    non obst CAD (Dr Burt Knack)  . Tee without cardioversion N/A 02/16/2013    Procedure: TRANSESOPHAGEAL  ECHOCARDIOGRAM (TEE);  Surgeon: Josue Hector, MD;  Location: [facility] ENDOSCOPY;  Service: Cardiovascular;  Laterality: N/A;   Family History  Problem Relation Age of Onset  . Lung cancer Brother   . Colon cancer Neg Hx   . Skin cancer Daughter   . Diabetes Sister   . Diabetes Brother    History  Substance Use Topics  . Smoking status: Never Smoker   . Smokeless tobacco: Never Used  . Alcohol Use: No   OB History   Grav Para Term Preterm Abortions TAB SAB Ect Mult Living                 Review of Systems  Constitutional: Negative for fever.  Respiratory: Negative for cough and shortness of breath.   Cardiovascular: Negative for chest pain.  Gastrointestinal: Negative for vomiting and abdominal pain.  Neurological: Positive for headaches.  All other systems reviewed and are negative.      Allergies  Bee venom; Nabumetone; Amoxicillin-pot clavulanate; Aspirin; Atorvastatin; Clopidogrel bisulfate; Codeine; Ezetimibe; Metformin and related; and Valsartan  Home Medications   Current Outpatient Rx  Name  Route  Sig  Dispense  Refill  . carvedilol (COREG) 6.25 MG tablet   Oral  Take 1 tablet (6.25 mg total) by mouth 2 (two) times daily with a meal.   60 tablet   11   . diphenhydrAMINE (BENADRYL) 25 MG tablet   Oral   Take 50 mg by mouth every 8 (eight) hours as needed for allergies.          Marland Kitchen glipiZIDE (GLUCOTROL XL) 5 MG 24 hr tablet   Oral   Take 1 tablet (5 mg total) by mouth daily.   30 tablet   11   . olmesartan-hydrochlorothiazide (BENICAR HCT) 40-25 MG per tablet   Oral   Take 1 tablet by mouth daily.   30 tablet   11   . ticlopidine (TICLID) 250 MG tablet   Oral   Take 1 tablet (250 mg total) by mouth 2 (two) times daily with a meal.   60 tablet   4   . EPINEPHrine (EPI-PEN) 0.3 mg/0.3 mL DEVI   Intramuscular   Inject 0.3 mg into the muscle daily as needed (allergic reaction).           BP 175/84  Pulse 76  Temp(Src) 97.9 F (36.6 C)  (Oral)  Resp 22  Ht 5\' 9"  (1.753 m)  Wt 205 lb (92.987 kg)  BMI 30.26 kg/m2  SpO2 98% Physical Exam  Nursing note and vitals reviewed. Constitutional: She is oriented to person, place, and time. She appears well-developed and well-nourished. No distress.  HENT:  Head: Normocephalic.    Eyes: EOM are normal. Pupils are equal, round, and reactive to light.  Neck: Normal range of motion. Neck supple.  Cardiovascular: Normal rate and regular rhythm.  Exam reveals no friction rub.   No murmur heard. Pulmonary/Chest: Effort normal and breath sounds normal. No respiratory distress. She has no wheezes. She has no rales.  Abdominal: Soft. She exhibits no distension. There is no tenderness. There is no rebound.  Musculoskeletal: Normal range of motion. She exhibits no edema.       Cervical back: She exhibits no tenderness and no bony tenderness.       Thoracic back: She exhibits no tenderness and no bony tenderness.       Lumbar back: She exhibits no tenderness and no bony tenderness.  Neurological: She is alert and oriented to person, place, and time.  Skin: She is not diaphoretic.    ED Course  Procedures (including critical care time) Labs Review Labs Reviewed - No data to display Imaging Review Ct Head Wo Contrast  08/26/2013   CLINICAL DATA:  Slipped and fell.  Pain to the posterior head.  EXAM: CT HEAD WITHOUT CONTRAST  TECHNIQUE: Contiguous axial images were obtained from the base of the skull through the vertex without intravenous contrast.  COMPARISON:  04/24/2013  FINDINGS: There is no evidence for acute hemorrhage, hydrocephalus, mass lesion, or abnormal extra-axial fluid collection. No definite CT evidence for acute infarction. Small lacune or infarcts in the left basal ganglia and inferior right cerebellar hemisphere are unchanged.  There is near complete opacification of the right maxillary sinus with marked mucosal disease and air-fluid level in the left maxillary sinus.  Evidence of bilateral maxillary sinus surgery is noted. There is prominent ethmoid air cell disease in the hypoplastic right frontal sinus is completely opacified, as before. Mastoid air cells are clear bilaterally.  No evidence for skull fracture  IMPRESSION: Stable exam.  No acute intracranial abnormality.  Acute on chronic paranasal sinusitis.   Electronically Signed   By: Verda Cumins.D.  On: 08/26/2013 17:10    EKG Interpretation   None       MDM   Final diagnoses:  Fall  Headache    60F on ticlopidine presents s/p fall. Fell on ice at home. Patient did not lose consciousness or have any vomiting. Here patient has posterior head pain without abrasion or laceration. No neck pain. No other injury.  CT Head negative. Stable for discharge.    Osvaldo Shipper, MD 08/26/13 [phone]

## 2013-08-29 ENCOUNTER — Telehealth: Payer: Self-pay | Admitting: *Deleted

## 2013-08-29 NOTE — Telephone Encounter (Signed)
Dr. Glori Bickers sent me a message requesting that I call and check on pt since she was d/c from hospital after a fall  Called pt and no answer and no voicemail (kept ringing)

## 2013-08-29 NOTE — Telephone Encounter (Signed)
Pt advised of Dr. Marliss Coots comments (what sxs to look out for) and verbalized understanding. Pt said if she has any new sxs she will call and let us know

## 2013-08-29 NOTE — Telephone Encounter (Signed)
Pt returned my call. She said she is doing okay she is just really sore. Pt said she is sore around her neck, back and rib cage. I offered to schedule a f/u appt. but pt declined, she said she is okay and has a f/u with cardiologist on 08/31/13 so she will have them check her out too.

## 2013-08-29 NOTE — Telephone Encounter (Signed)
Ok, that is her choice- please watch out for headache/ n/v/dizziness/ personality change or other symptoms of head injury - sometimes these symptoms can begin after the injury and we should keep an eye on her for that

## 2013-08-31 ENCOUNTER — Ambulatory Visit (INDEPENDENT_AMBULATORY_CARE_PROVIDER_SITE_OTHER): Payer: Medicare HMO | Admitting: Internal Medicine

## 2013-08-31 ENCOUNTER — Encounter: Payer: Self-pay | Admitting: Internal Medicine

## 2013-08-31 VITALS — BP 110/72 | HR 67 | Ht 69.0 in | Wt 206.0 lb

## 2013-08-31 DIAGNOSIS — I6350 Cerebral infarction due to unspecified occlusion or stenosis of unspecified cerebral artery: Secondary | ICD-10-CM

## 2013-08-31 DIAGNOSIS — I10 Essential (primary) hypertension: Secondary | ICD-10-CM

## 2013-08-31 DIAGNOSIS — R079 Chest pain, unspecified: Secondary | ICD-10-CM

## 2013-08-31 DIAGNOSIS — I639 Cerebral infarction, unspecified: Secondary | ICD-10-CM

## 2013-08-31 DIAGNOSIS — Z959 Presence of cardiac and vascular implant and graft, unspecified: Secondary | ICD-10-CM

## 2013-08-31 LAB — MDC_IDC_ENUM_SESS_TYPE_INCLINIC
Date Time Interrogation Session: 20150225150116
Zone Setting Detection Interval: 2000 ms
Zone Setting Detection Interval: 3000 ms
Zone Setting Detection Interval: 390 ms

## 2013-08-31 NOTE — Assessment & Plan Note (Signed)
Her blood pressures become quite low. We'll decrease her carvedilol 6.25--3.125 twice daily. We'll check her olmesartan HCT to have her take it at night and cut the dose also from 40/25--20/12.5. She'll let us know about 10 days as she is doing. We'll have her follow up with one of the PAs in about 4 weeks' time to make sure it may appropriate headway it may be appropriate to stop this diuretic

## 2013-08-31 NOTE — Progress Notes (Signed)
F.[email or web address]      Patient Care Team: Abner Greenspan, MD as PCP - General   HPI  Margaret Vazquez is a 78 y.o. female Seen in followup for a leg recorder implanted for cryptogenic stroke. She has significant hypertension which is much improved  Her  biggest complaint is fatigue. She has had problems with  chest pain that prompted her to go to the emergency room. These are described as sharp and lancinating and very very brief period she also fell down walking. She is recorded blood pressures at home as low as 70. Past Medical History  Diagnosis Date  . Hypertension   . Diabetes mellitus     type II  . Hyperlipidemia     myalgias with Lipitor and Zetia  . GERD (gastroesophageal reflux disease)   . Skin cancer     hx of basal cell/ak's  . Colon polyps 2009  . Allergy history, drug     Aspirin  . Cervical stenosis of spine     With neck pain  . CAD (coronary artery disease) 04/08/11    non obst by cath  . Migraine   . TIA (transient ischemic attack)   . Stroke     Past Surgical History  Procedure Laterality Date  . Appendectomy    . Tubal ligation      BTL  . Breast surgery  1992    breast biopsy  . Eye surgery  2005    tear duct surgery  . Nasal sinus surgery  01/2005  . Cardiac catheterization  1993    LHC: normal coronaries.   . Stress cardiolite  11/1999    Normal/ negative  . Cystoscopy w/ decannulation  03/2000    Normal  . Abd [place]  07/2003    Negative  . Tear duct surgery  2005  . Colonoscopy  12/2007    Adenomatous colon polyps  . Cardiac catheterization  04/07/2011    non obst CAD (Dr Burt Knack)  . Tee without cardioversion N/A 02/16/2013    Procedure: TRANSESOPHAGEAL ECHOCARDIOGRAM (TEE);  Surgeon: Josue Hector, MD;  Location: [facility] ENDOSCOPY;  Service: Cardiovascular;  Laterality: N/A;    Current Outpatient Prescriptions  Medication Sig Dispense Refill  . carvedilol (COREG) 6.25 MG tablet Take 1 tablet (6.25 mg total) by mouth 2 (two) times daily with a meal.   60 tablet  11  . diphenhydrAMINE (BENADRYL) 25 MG tablet Take 50 mg by mouth every 8 (eight) hours as needed for allergies.       Marland Kitchen EPINEPHrine (EPI-PEN) 0.3 mg/0.3 mL DEVI Inject 0.3 mg into the muscle daily as needed (allergic reaction).       Marland Kitchen glipiZIDE (GLUCOTROL XL) 5 MG 24 hr tablet Take 1 tablet (5 mg total) by mouth daily.  30 tablet  11  . olmesartan-hydrochlorothiazide (BENICAR HCT) 40-25 MG per tablet Take 1 tablet by mouth daily.  30 tablet  11  . ticlopidine (TICLID) 250 MG tablet Take 1 tablet (250 mg total) by mouth 2 (two) times daily with a meal.  60 tablet  4  . tobramycin-dexamethasone (TOBRADEX) ophthalmic solution Place 1 drop into both eyes as needed.      . [DISCONTINUED] levETIRAcetam (KEPPRA) 250 MG tablet Take 1 tablet (250 mg total) by mouth 2 (two) times daily.  60 tablet  0   No current facility-administered medications for this visit.    Allergies  Allergen Reactions  . Bee Venom Hives, Shortness Of Breath and Swelling  .  Nabumetone Anaphylaxis  . Amoxicillin-Pot Clavulanate Hives and Swelling    To lips.  . Aspirin Hives  . Atorvastatin Swelling     joint pain/swelling, inc liver tests  . Clopidogrel Bisulfate Hives  . Codeine Nausea And Vomiting  . Ezetimibe Other (See Comments)     fatigue  . Metformin And Related Other (See Comments)    Diarrhea   . Valsartan Other (See Comments)     fatigue    Review of Systems negative except from HPI and PMH  Physical Exam BP 110/72  Pulse 67  Ht '5\' 9"'$  (1.753 m)  Wt 206 lb (93.441 kg)  BMI 30.41 kg/m2 Well developed and well nourished in no acute distress HENT normal E scleral and icterus clear Neck Supple JVP flat; carotids brisk and full Clear to ausculation  Device pocket well healed; without hematoma or erythema.  There is no tethering  Regular rate and rhythm, no murmurs gallops or rub Soft with active bowel sounds No clubbing cyanosis no Edema Alert and oriented, grossly normal motor and  sensory function Skin Warm and Dry    Assessment and  Plan

## 2013-08-31 NOTE — Assessment & Plan Note (Addendum)
Noncardiac chest pain ECG last week was normal troponins were also normal

## 2013-08-31 NOTE — Patient Instructions (Signed)
Your physician has recommended you make the following change in your medication:  1) Decrease Carvedilol to 6.25 twice a day 2) Decrease Benicar HCT to 20-12.5 mg at bedtime  Your physician recommends that you schedule a follow-up appointment in: 4 weeks with PA  Your physician wants you to follow-up in: 6 months with Dr. Caryl Comes. You will receive a reminder letter in the mail two months in advance. If you don't receive a letter, please call our office to schedule the follow-up appointment.  Please call us in 10 days to let us know blood pressures and how you are feeling --- [phone]

## 2013-08-31 NOTE — Assessment & Plan Note (Signed)
The patient's device was interrogated.  The information was reviewed. No changes were made in the programming.    

## 2013-09-12 ENCOUNTER — Telehealth: Payer: Self-pay | Admitting: *Deleted

## 2013-09-12 NOTE — Telephone Encounter (Signed)
Called pt to check and see how she was doing since OV with Caryl Comes last month. Pt states BPs have improved and she is feeling much better.  Reported BPs: 136/82, 123/73, 132/70, 122/71, 130/67

## 2013-09-14 ENCOUNTER — Other Ambulatory Visit: Payer: Self-pay | Admitting: *Deleted

## 2013-09-14 DIAGNOSIS — E119 Type 2 diabetes mellitus without complications: Secondary | ICD-10-CM

## 2013-09-14 MED ORDER — GLUCOSE BLOOD VI STRP: ORAL_STRIP | Status: DC

## 2013-09-14 MED ORDER — OLMESARTAN MEDOXOMIL-HCTZ 20-12.5 MG PO TABS: 1.0000 | ORAL_TABLET | Freq: Every day | ORAL | Status: DC

## 2013-09-26 ENCOUNTER — Ambulatory Visit (INDEPENDENT_AMBULATORY_CARE_PROVIDER_SITE_OTHER): Payer: Commercial Managed Care - HMO | Admitting: *Deleted

## 2013-09-26 DIAGNOSIS — I6350 Cerebral infarction due to unspecified occlusion or stenosis of unspecified cerebral artery: Secondary | ICD-10-CM

## 2013-09-26 DIAGNOSIS — I639 Cerebral infarction, unspecified: Secondary | ICD-10-CM

## 2013-09-28 ENCOUNTER — Ambulatory Visit (INDEPENDENT_AMBULATORY_CARE_PROVIDER_SITE_OTHER): Payer: Medicare HMO | Admitting: Nurse Practitioner

## 2013-09-28 ENCOUNTER — Encounter: Payer: Self-pay | Admitting: Nurse Practitioner

## 2013-09-28 VITALS — BP 100/70 | HR 70 | Ht 69.0 in | Wt 201.1 lb

## 2013-09-28 DIAGNOSIS — I10 Essential (primary) hypertension: Secondary | ICD-10-CM

## 2013-09-28 DIAGNOSIS — I6350 Cerebral infarction due to unspecified occlusion or stenosis of unspecified cerebral artery: Secondary | ICD-10-CM

## 2013-09-28 DIAGNOSIS — I639 Cerebral infarction, unspecified: Secondary | ICD-10-CM

## 2013-09-28 DIAGNOSIS — R5383 Other fatigue: Secondary | ICD-10-CM

## 2013-09-28 DIAGNOSIS — R5381 Other malaise: Secondary | ICD-10-CM

## 2013-09-28 NOTE — Patient Instructions (Addendum)
Stop your Coreg  Stay on your other medicines  Monitor your blood pressure at home  I will see you in a month  Call the [place] office at [phone] if you have any questions, problems or concerns.

## 2013-09-28 NOTE — Progress Notes (Signed)
Margaret Vazquez Date of Birth: 07-06-1936 Medical Record #[id number]  History of Present Illness: Ms. Wiens is seen back today for a one month check. Seen for Dr. Caryl Comes. She has HTN, DM, HLD, GERD, nonobstructive CAD by cath back in 2012 and cryptogenic stroke with subsequent loop recorder implant.   Seen for her device check a month ago. Was fatigued. Had fallen as well. BP quite low. Medicines were cut back. Coreg decreased and Benicar HCT cut back.   Comes in today. Here alone. BP still low. She has cut her Coreg back to just QD dosing. Still fatigued and sleepy. BP running about 235 systolic at home. No falls. Not really dizzy or lightheaded but fatigued. No chest pain. On chronic Ticlid due to aspirin and Plavix allergy. She restricts her salt. No swelling.    Current Outpatient Prescriptions  Medication Sig Dispense Refill  . carvedilol (COREG) 6.25 MG tablet Take 6.25 mg by mouth daily.      . diphenhydrAMINE (BENADRYL) 25 MG tablet Take 50 mg by mouth every 8 (eight) hours as needed for allergies.       Marland Kitchen EPINEPHrine (EPI-PEN) 0.3 mg/0.3 mL DEVI Inject 0.3 mg into the muscle daily as needed (allergic reaction).       Marland Kitchen glipiZIDE (GLUCOTROL XL) 5 MG 24 hr tablet Take 1 tablet (5 mg total) by mouth daily.  30 tablet  11  . glucose blood test strip Use to check blood sugar one time a day.  Dx: 250.00  100 each  3  . olmesartan-hydrochlorothiazide (BENICAR HCT) 20-12.5 MG per tablet Take 1 tablet by mouth daily.  30 tablet  1  . ticlopidine (TICLID) 250 MG tablet Take 1 tablet (250 mg total) by mouth 2 (two) times daily with a meal.  60 tablet  4  . tobramycin-dexamethasone (TOBRADEX) ophthalmic solution Place 1 drop into both eyes as needed.      . [DISCONTINUED] levETIRAcetam (KEPPRA) 250 MG tablet Take 1 tablet (250 mg total) by mouth 2 (two) times daily.  60 tablet  0   No current facility-administered medications for this visit.    Allergies  Allergen Reactions  . Bee Venom  Hives, Shortness Of Breath and Swelling  . Nabumetone Anaphylaxis  . Amoxicillin-Pot Clavulanate Hives and Swelling    To lips.  . Aspirin Hives  . Atorvastatin Swelling     joint pain/swelling, inc liver tests  . Clopidogrel Bisulfate Hives  . Codeine Nausea And Vomiting  . Ezetimibe Other (See Comments)     fatigue  . Metformin And Related Other (See Comments)    Diarrhea   . Valsartan Other (See Comments)     fatigue    Past Medical History  Diagnosis Date  . Hypertension   . Diabetes mellitus     type II  . Hyperlipidemia     myalgias with Lipitor and Zetia  . GERD (gastroesophageal reflux disease)   . Skin cancer     hx of basal cell/ak's  . Colon polyps 2009  . Allergy history, drug     Aspirin  . Cervical stenosis of spine     With neck pain  . CAD (coronary artery disease) 04/08/11    non obst by cath  . Migraine   . TIA (transient ischemic attack)   . Stroke     Past Surgical History  Procedure Laterality Date  . Appendectomy    . Tubal ligation      BTL  . Breast surgery  1992    breast biopsy  . Eye surgery  2005    tear duct surgery  . Nasal sinus surgery  01/2005  . Cardiac catheterization  1993    LHC: normal coronaries.   . Stress cardiolite  11/1999    Normal/ negative  . Cystoscopy w/ decannulation  03/2000    Normal  . Abd [place]  07/2003    Negative  . Tear duct surgery  2005  . Colonoscopy  12/2007    Adenomatous colon polyps  . Cardiac catheterization  04/07/2011    non obst CAD (Dr Burt Knack)  . Tee without cardioversion N/A 02/16/2013    Procedure: TRANSESOPHAGEAL ECHOCARDIOGRAM (TEE);  Surgeon: Josue Hector, MD;  Location: [facility] ENDOSCOPY;  Service: Cardiovascular;  Laterality: N/A;    History  Smoking status  . Never Smoker   Smokeless tobacco  . Never Used    History  Alcohol Use No    Family History  Problem Relation Age of Onset  . Lung cancer Brother   . Colon cancer Neg Hx   . Skin cancer Daughter   . Diabetes Sister    . Diabetes Brother     Review of Systems: The review of systems is per the HPI.  All other systems were reviewed and are negative.  Physical Exam: BP 100/70  Pulse 70  Ht 5\' 9"  (1.753 m)  Wt 201 lb 1.9 oz (91.227 kg)  BMI 29.69 kg/m2  SpO2 95% Patient is very pleasant and in no acute distress. Skin is warm and dry. Color is normal.  HEENT is unremarkable. Normocephalic/atraumatic. PERRL. Sclera are nonicteric. Neck is supple. No masses. No JVD. Lungs are clear. Cardiac exam shows a regular rate and rhythm. Abdomen is soft. Extremities are without edema. Gait and ROM are intact. No gross neurologic deficits noted.  LABORATORY DATA:  Lab Results  Component Value Date   WBC 6.2 08/22/2013   HGB 12.6 08/22/2013   HCT 35.9* 08/22/2013   PLT 226 08/22/2013   GLUCOSE 170* 08/22/2013   CHOL 184 04/25/2013   TRIG 222* 04/25/2013   HDL 45 04/25/2013   LDLDIRECT 136.5 10/20/2011   LDLCALC 95 04/25/2013   ALT 11 04/25/2013   AST 12 04/25/2013   NA 139 08/22/2013   K 4.3 08/22/2013   CL 102 08/22/2013   CREATININE 1.03 08/22/2013   BUN 21 08/22/2013   CO2 26 08/22/2013   TSH 4.118 04/25/2013   INR 0.95 04/24/2013   HGBA1C 6.7* 04/25/2013   MICROALBUR 0.5 05/27/2010     Assessment / Plan: 1. Hypotension - she has already cut her Coreg back to QD - I am stopping altogether today. See her back in a month. She will continue to monitor at home.   2. Recent stroke - has loop in place  3. HLD  4. Nonobstructive CAD - no chest pain reported.   Patient is agreeable to this plan and will call if any problems develop in the interim.   Burtis Junes, RN, [facility] [address] [place], [place]  [address] [phone]

## 2013-10-10 ENCOUNTER — Encounter: Payer: Self-pay | Admitting: Internal Medicine

## 2013-10-24 ENCOUNTER — Ambulatory Visit (INDEPENDENT_AMBULATORY_CARE_PROVIDER_SITE_OTHER): Payer: Commercial Managed Care - HMO | Admitting: *Deleted

## 2013-10-24 DIAGNOSIS — I6350 Cerebral infarction due to unspecified occlusion or stenosis of unspecified cerebral artery: Secondary | ICD-10-CM

## 2013-10-24 LAB — MDC_IDC_ENUM_SESS_TYPE_REMOTE

## 2013-10-25 LAB — MDC_IDC_ENUM_SESS_TYPE_REMOTE

## 2013-11-02 ENCOUNTER — Ambulatory Visit: Payer: Medicare HMO | Admitting: Nurse Practitioner

## 2013-11-04 ENCOUNTER — Other Ambulatory Visit: Payer: Self-pay | Admitting: *Deleted

## 2013-11-04 MED ORDER — GLIPIZIDE ER 5 MG PO TB24: 5.0000 mg | ORAL_TABLET | Freq: Every day | ORAL | Status: DC

## 2013-11-14 ENCOUNTER — Ambulatory Visit (INDEPENDENT_AMBULATORY_CARE_PROVIDER_SITE_OTHER)
Admission: RE | Admit: 2013-11-14 | Discharge: 2013-11-14 | Disposition: A | Discharge status: Home / Self Care | Payer: Medicare HMO | Source: Ambulatory Visit | Attending: Family Medicine | Admitting: Family Medicine

## 2013-11-14 ENCOUNTER — Ambulatory Visit (INDEPENDENT_AMBULATORY_CARE_PROVIDER_SITE_OTHER): Payer: Commercial Managed Care - HMO | Admitting: Family Medicine

## 2013-11-14 ENCOUNTER — Encounter: Payer: Self-pay | Admitting: Family Medicine

## 2013-11-14 VITALS — BP 124/56 | HR 63 | Temp 97.7°F | Ht 69.0 in | Wt 201.8 lb

## 2013-11-14 DIAGNOSIS — I10 Essential (primary) hypertension: Secondary | ICD-10-CM

## 2013-11-14 DIAGNOSIS — M79644 Pain in right finger(s): Secondary | ICD-10-CM

## 2013-11-14 DIAGNOSIS — M25552 Pain in left hip: Secondary | ICD-10-CM | POA: Insufficient documentation

## 2013-11-14 DIAGNOSIS — M79609 Pain in unspecified limb: Secondary | ICD-10-CM

## 2013-11-14 DIAGNOSIS — M25559 Pain in unspecified hip: Secondary | ICD-10-CM

## 2013-11-14 DIAGNOSIS — Z85828 Personal history of other malignant neoplasm of skin: Secondary | ICD-10-CM

## 2013-11-14 DIAGNOSIS — E119 Type 2 diabetes mellitus without complications: Secondary | ICD-10-CM

## 2013-11-14 LAB — COMPREHENSIVE METABOLIC PANEL
ALT: 14 U/L (ref 0–35)
AST: 14 U/L (ref 0–37)
Albumin: 3.6 g/dL (ref 3.5–5.2)
Alkaline Phosphatase: 75 U/L (ref 39–117)
BUN: 20 mg/dL (ref 6–23)
CO2: 29 mEq/L (ref 19–32)
Calcium: 9 mg/dL (ref 8.4–10.5)
Chloride: 103 mEq/L (ref 96–112)
Creatinine, Ser: 1 mg/dL (ref 0.4–1.2)
GFR: 57.94 mL/min — ABNORMAL LOW (ref >60.00)
Glucose, Bld: 182 mg/dL — ABNORMAL HIGH (ref 70–99)
Potassium: 4.2 mEq/L (ref 3.5–5.1)
Sodium: 138 mEq/L (ref 135–145)
Total Bilirubin: 0.5 mg/dL (ref 0.2–1.2)
Total Protein: 7 g/dL (ref 6.0–8.3)

## 2013-11-14 LAB — HEMOGLOBIN A1C: Hgb A1c MFr Bld: 6.8 % — ABNORMAL HIGH (ref 4.6–6.5)

## 2013-11-14 MED ORDER — OLMESARTAN MEDOXOMIL-HCTZ 20-12.5 MG PO TABS
1.0000 | ORAL_TABLET | Freq: Every day | ORAL | Status: DC
Start: 2013-11-14 — End: 2014-08-30

## 2013-11-14 NOTE — Assessment & Plan Note (Signed)
For ins pt needs ref to her dermatologist -done

## 2013-11-14 NOTE — Assessment & Plan Note (Signed)
A1C today Per pt - sounds like glucose control is stable  emph imp of diet/ activity and wt loss

## 2013-11-14 NOTE — Patient Instructions (Signed)
Lab today Xray of thumb today  Put ice / cold compress on thumb when you can  Stop at check out for referral

## 2013-11-14 NOTE — Assessment & Plan Note (Signed)
Suspect OA  Will check xray  Disc relative rest / ice or heat if helpful

## 2013-11-14 NOTE — Progress Notes (Signed)
Pre visit review using our clinic review tool, if applicable. No additional management support is needed unless otherwise documented below in the visit note. 

## 2013-11-14 NOTE — Assessment & Plan Note (Signed)
Suspect trochanteric bursitis Recommend ice as often as possible and avoidance of lying on that side  May need ref for injection and further eval if no imp

## 2013-11-14 NOTE — Assessment & Plan Note (Signed)
bp in fair control at this time  BP Readings from Last 1 Encounters:  11/14/13 124/56   No changes needed Disc lifstyle change with low sodium diet and exercise  Doing ok of the bystolic for now -feeling better Will continue to monitor

## 2013-11-14 NOTE — Progress Notes (Signed)
Subjective:    Patient ID: Margaret Vazquez, female    DOB: Oct 22, 1935, 78 y.o.   MRN: [id number]  HPI Here for f/u of chronic health problems   Wt is stable with bmi of 29   bp is labile so Dr Caryl Comes put her on hctz (ran it too low)- then he cut it in 1/2  She was tired/sleepy from her carvedilol- and the cardiology PA took her off of  She does feel better off of it   BP: 124/56 mmHg   119/58 at home Has felt better     Chemistry      Component Value Date/Time   NA 139 08/22/2013 1223   K 4.3 08/22/2013 1223   CL 102 08/22/2013 1223   CO2 26 08/22/2013 1223   BUN 21 08/22/2013 1223   CREATININE 1.03 08/22/2013 1223      Component Value Date/Time   CALCIUM 9.1 08/22/2013 1223   ALKPHOS 79 04/25/2013 0616   AST 12 04/25/2013 0616   ALT 11 04/25/2013 0616   BILITOT 0.2* 04/25/2013 0616      DM- due for A1C Sugar has run pretty good - usually below 120 in the am  Does not check in afternoon  Lab Results  Component Value Date   HGBA1C 6.7* 04/25/2013      L hip hurts- on the side - and it hurts to lie on - points to her lower back  No radiation of leg  Also has pain in her lower back when she works in the yard   R thumb hurts- 1 month / hurts at the base/ dull pain / no rep movement  Is R handed  No swelling  No triggering or skin change  No numbness or tingling   Patient Active Problem List   Diagnosis Date Noted  . Chest pain at rest 08/31/2013  . LOOP Recorder LINQ 06/07/2013  . Fatigue 06/07/2013  . Sinusitis, chronic 05/16/2013  . Acute ischemic stroke 02/15/2013  . Numbness and tingling of right arm 02/14/2013  . Encounter for Medicare annual wellness exam 10/27/2012  . Encopresis 12/15/2011  . Post-menopausal 10/28/2011  . PERSONAL HX COLONIC POLYPS 09/04/2009  . VITAMIN B12 DEFICIENCY 03/06/2009  . UNSPECIFIED ANEMIA 02/13/2009  . ALLERGIC RHINITIS 02/14/2008  . BACK PAIN, LUMBAR 11/03/2007  . HYPERLIPIDEMIA 11/24/2006  . HYPERTENSION, ESSENTIAL  NOS 11/24/2006  . DIABETES MELLITUS, TYPE II 10/09/2006  . GERD 10/09/2006  . OVERACTIVE BLADDER 10/09/2006  . INCONTINENCE, URGE 10/09/2006  . SKIN CANCER, HX OF 10/09/2006   Past Medical History  Diagnosis Date  . Hypertension   . Diabetes mellitus     type II  . Hyperlipidemia     myalgias with Lipitor and Zetia  . GERD (gastroesophageal reflux disease)   . Skin cancer     hx of basal cell/ak's  . Colon polyps 2009  . Allergy history, drug     Aspirin  . Cervical stenosis of spine     With neck pain  . CAD (coronary artery disease) 04/08/11    non obst by cath  . Migraine   . TIA (transient ischemic attack)   . Stroke August 2014   Past Surgical History  Procedure Laterality Date  . Appendectomy    . Tubal ligation      BTL  . Breast surgery  1992    breast biopsy  . Eye surgery  2005    tear duct surgery  . Nasal sinus surgery  01/2005  . Cardiac catheterization  1993    LHC: normal coronaries.   . Stress cardiolite  11/1999    Normal/ negative  . Cystoscopy w/ decannulation  03/2000    Normal  . Abd [place]  07/2003    Negative  . Tear duct surgery  2005  . Colonoscopy  12/2007    Adenomatous colon polyps  . Cardiac catheterization  04/07/2011    non obst CAD (Dr Burt Knack)  . Tee without cardioversion N/A 02/16/2013    Procedure: TRANSESOPHAGEAL ECHOCARDIOGRAM (TEE);  Surgeon: Josue Hector, MD;  Location: [facility] ENDOSCOPY;  Service: Cardiovascular;  Laterality: N/A;   History  Substance Use Topics  . Smoking status: Never Smoker   . Smokeless tobacco: Never Used  . Alcohol Use: No   Family History  Problem Relation Age of Onset  . Lung cancer Brother   . Colon cancer Neg Hx   . Skin cancer Daughter   . Diabetes Sister   . Diabetes Brother   . Heart disease Mother   . Heart disease Father    Allergies  Allergen Reactions  . Bee Venom Hives, Shortness Of Breath and Swelling  . Nabumetone Anaphylaxis  . Amoxicillin-Pot Clavulanate Hives and Swelling     To lips.  . Aspirin Hives  . Atorvastatin Swelling     joint pain/swelling, inc liver tests  . Clopidogrel Bisulfate Hives  . Codeine Nausea And Vomiting  . Ezetimibe Other (See Comments)     fatigue  . Metformin And Related Other (See Comments)    Diarrhea   . Valsartan Other (See Comments)     fatigue   Current Outpatient Prescriptions on File Prior to Visit  Medication Sig Dispense Refill  . diphenhydrAMINE (BENADRYL) 25 MG tablet Take 50 mg by mouth every 8 (eight) hours as needed for allergies.       Marland Kitchen EPINEPHrine (EPI-PEN) 0.3 mg/0.3 mL DEVI Inject 0.3 mg into the muscle daily as needed (allergic reaction).       Marland Kitchen glipiZIDE (GLUCOTROL XL) 5 MG 24 hr tablet Take 1 tablet (5 mg total) by mouth daily.  30 tablet  5  . glucose blood test strip Use to check blood sugar one time a day.  Dx: 250.00  100 each  3  . olmesartan-hydrochlorothiazide (BENICAR HCT) 20-12.5 MG per tablet Take 1 tablet by mouth daily.  30 tablet  1  . ticlopidine (TICLID) 250 MG tablet Take 1 tablet (250 mg total) by mouth 2 (two) times daily with a meal.  60 tablet  4  . tobramycin-dexamethasone (TOBRADEX) ophthalmic solution Place 1 drop into both eyes as needed.      . [DISCONTINUED] levETIRAcetam (KEPPRA) 250 MG tablet Take 1 tablet (250 mg total) by mouth 2 (two) times daily.  60 tablet  0   No current facility-administered medications on file prior to visit.     Review of Systems Review of Systems  Constitutional: Negative for fever, appetite change, fatigue and unexpected weight change.  Eyes: Negative for pain and visual disturbance.  Respiratory: Negative for cough and shortness of breath.   Cardiovascular: Negative for cp or palpitations    Gastrointestinal: Negative for nausea, diarrhea and constipation.  Genitourinary: Negative for urgency and frequency.  Skin: Negative for pallor or rash   MSK pos for back hip pain and thumb pain  Neurological: Negative for weakness, light-headedness,  numbness and headaches.  Hematological: Negative for adenopathy. Does not bruise/bleed easily.  Psychiatric/Behavioral: Negative for dysphoric mood. The  patient is not nervous/anxious.         Objective:   Physical Exam  Constitutional: She appears well-developed and well-nourished. No distress.  obese and well appearing   HENT:  Head: Normocephalic and atraumatic.  Mouth/Throat: Oropharynx is clear and moist.  Eyes: Conjunctivae and EOM are normal. Pupils are equal, round, and reactive to light. Right eye exhibits no discharge. Left eye exhibits no discharge. No scleral icterus.  Neck: Normal range of motion. Neck supple.  Cardiovascular: Normal rate, regular rhythm, normal heart sounds and intact distal pulses.  Exam reveals no gallop.   No murmur heard. Pulmonary/Chest: Effort normal and breath sounds normal. No respiratory distress. She has no wheezes. She has no rales.  Musculoskeletal: She exhibits no edema.  R thumb is tender at the first MCP joint without crepitus or triggering  Nl rom with pain on full flexion No swelling   L hip- tender over greater trochanter  Lymphadenopathy:    She has no cervical adenopathy.  Neurological: She is alert. She has normal reflexes. No cranial nerve deficit. She exhibits normal muscle tone. Coordination normal.  Skin: Skin is warm and dry.  Psychiatric: She has a normal mood and affect.          Assessment & Plan:

## 2013-11-22 ENCOUNTER — Telehealth: Payer: Self-pay | Admitting: Family Medicine

## 2013-11-22 DIAGNOSIS — M79644 Pain in right finger(s): Secondary | ICD-10-CM

## 2013-11-22 NOTE — Telephone Encounter (Signed)
Pt called,  Right thumb not doing better, she would like to be referred to a hand specialist in [place], she wants first available, requested we just make the appointment and call her with the time.  Best number to reach pt is [phone]

## 2013-11-29 ENCOUNTER — Ambulatory Visit (INDEPENDENT_AMBULATORY_CARE_PROVIDER_SITE_OTHER): Payer: Commercial Managed Care - HMO | Admitting: *Deleted

## 2013-11-29 DIAGNOSIS — I6350 Cerebral infarction due to unspecified occlusion or stenosis of unspecified cerebral artery: Secondary | ICD-10-CM

## 2013-11-29 DIAGNOSIS — I639 Cerebral infarction, unspecified: Secondary | ICD-10-CM

## 2013-11-30 ENCOUNTER — Encounter: Payer: Self-pay | Admitting: Nurse Practitioner

## 2013-11-30 ENCOUNTER — Ambulatory Visit (INDEPENDENT_AMBULATORY_CARE_PROVIDER_SITE_OTHER): Payer: Commercial Managed Care - HMO | Admitting: Nurse Practitioner

## 2013-11-30 VITALS — BP 110/70 | HR 69 | Ht 69.0 in | Wt 202.1 lb

## 2013-11-30 DIAGNOSIS — I10 Essential (primary) hypertension: Secondary | ICD-10-CM

## 2013-11-30 NOTE — Patient Instructions (Signed)
See Dr. Caryl Comes in September as planned  Stay on your current medicines  Stay active  Call the [place] office at [phone] if you have any questions, problems or concerns.

## 2013-11-30 NOTE — Progress Notes (Signed)
Margaret Vazquez Date of Birth: 01-Apr-1936 Medical Record [id number]  History of Present Illness: Margaret Vazquez is seen back today for a one month check. Seen for Dr. Caryl Comes. She has HTN, DM, HLD, GERD, nonobstructive CAD by cath back in 2012 and cryptogenic stroke with subsequent loop recorder implant. On chronic Ticlid due to aspirin and Plavix allergy.   Seen for her device check 2 months ago. Was fatigued. Had fallen as well. BP quite low. Medicines were cut back. Coreg decreased and Benicar HCT cut back.   I saw her last month - she had cut her Coreg back more. I ended up stopping altogether.   Comes in today. Here alone. Doing well. BP has improved. No chest pain. Not short of breath. She is happy with how she is doing.  Remains off of Coreg.   Current Outpatient Prescriptions  Medication Sig Dispense Refill  . diphenhydrAMINE (BENADRYL) 25 MG tablet Take 50 mg by mouth every 8 (eight) hours as needed for allergies.       Marland Kitchen EPINEPHrine (EPI-PEN) 0.3 mg/0.3 mL DEVI Inject 0.3 mg into the muscle daily as needed (allergic reaction).       Marland Kitchen glipiZIDE (GLUCOTROL XL) 5 MG 24 hr tablet Take 1 tablet (5 mg total) by mouth daily.  30 tablet  5  . glucose blood test strip Use to check blood sugar one time a day.  Dx: 250.00  100 each  3  . olmesartan-hydrochlorothiazide (BENICAR HCT) 20-12.5 MG per tablet Take 1 tablet by mouth daily.  30 tablet  11  . ticlopidine (TICLID) 250 MG tablet Take 1 tablet (250 mg total) by mouth 2 (two) times daily with a meal.  60 tablet  4  . tobramycin-dexamethasone (TOBRADEX) ophthalmic solution Place 1 drop into both eyes as needed.      . [DISCONTINUED] levETIRAcetam (KEPPRA) 250 MG tablet Take 1 tablet (250 mg total) by mouth 2 (two) times daily.  60 tablet  0   No current facility-administered medications for this visit.    Allergies  Allergen Reactions  . Bee Venom Hives, Shortness Of Breath and Swelling  . Nabumetone Anaphylaxis  . Amoxicillin-Pot  Clavulanate Hives and Swelling    To lips.  . Aspirin Hives  . Atorvastatin Swelling     joint pain/swelling, inc liver tests  . Clopidogrel Bisulfate Hives  . Codeine Nausea And Vomiting  . Ezetimibe Other (See Comments)     fatigue  . Metformin And Related Other (See Comments)    Diarrhea   . Valsartan Other (See Comments)     fatigue    Past Medical History  Diagnosis Date  . Hypertension   . Diabetes mellitus     type II  . Hyperlipidemia     myalgias with Lipitor and Zetia  . GERD (gastroesophageal reflux disease)   . Skin cancer     hx of basal cell/ak's  . Colon polyps 2009  . Allergy history, drug     Aspirin  . Cervical stenosis of spine     With neck pain  . CAD (coronary artery disease) 04/08/11    non obst by cath  . Migraine   . TIA (transient ischemic attack)   . Stroke August 2014    Past Surgical History  Procedure Laterality Date  . Appendectomy    . Tubal ligation      BTL  . Breast surgery  1992    breast biopsy  . Eye surgery  2005  tear duct surgery  . Nasal sinus surgery  01/2005  . Cardiac catheterization  1993    LHC: normal coronaries.   . Stress cardiolite  11/1999    Normal/ negative  . Cystoscopy w/ decannulation  03/2000    Normal  . Abd [place]  07/2003    Negative  . Tear duct surgery  2005  . Colonoscopy  12/2007    Adenomatous colon polyps  . Cardiac catheterization  04/07/2011    non obst CAD (Dr Burt Knack)  . Tee without cardioversion N/A 02/16/2013    Procedure: TRANSESOPHAGEAL ECHOCARDIOGRAM (TEE);  Surgeon: Josue Hector, MD;  Location: [facility] ENDOSCOPY;  Service: Cardiovascular;  Laterality: N/A;    History  Smoking status  . Never Smoker   Smokeless tobacco  . Never Used    History  Alcohol Use No    Family History  Problem Relation Age of Onset  . Lung cancer Brother   . Colon cancer Neg Hx   . Skin cancer Daughter   . Diabetes Sister   . Diabetes Brother   . Heart disease Mother   . Heart disease Father       Review of Systems: The review of systems is per the HPI.  All other systems were reviewed and are negative.  Physical Exam: BP 110/70  Pulse 69  Ht '5\' 9"'$  (1.753 m)  Wt 202 lb 1.9 oz (91.681 kg)  BMI 29.83 kg/m2  SpO2 94% Patient is very pleasant and in no acute distress. Skin is warm and dry. Color is normal.  HEENT is unremarkable. Normocephalic/atraumatic. PERRL. Sclera are nonicteric. Neck is supple. No masses. No JVD. Lungs are clear. Cardiac exam shows a regular rate and rhythm. Abdomen is soft. Extremities are without edema. Gait and ROM are intact. No gross neurologic deficits noted.   LABORATORY DATA:  Lab Results  Component Value Date   WBC 6.2 08/22/2013   HGB 12.6 08/22/2013   HCT 35.9* 08/22/2013   PLT 226 08/22/2013   GLUCOSE 182* 11/14/2013   CHOL 184 04/25/2013   TRIG 222* 04/25/2013   HDL 45 04/25/2013   LDLDIRECT 136.5 10/20/2011   LDLCALC 95 04/25/2013   ALT 14 11/14/2013   AST 14 11/14/2013   NA 138 11/14/2013   K 4.2 11/14/2013   CL 103 11/14/2013   CREATININE 1.0 11/14/2013   BUN 20 11/14/2013   CO2 29 11/14/2013   TSH 4.118 04/25/2013   INR 0.95 04/24/2013   HGBA1C 6.8* 11/14/2013   MICROALBUR 0.5 05/27/2010    Assessment / Plan: 1. Hypotension - now off of Coreg - BP has improved. She feels better. I have left her on her current regimen. I will be happy to see back as needed.   2. Recent stroke - has loop in place - to see Dr. Caryl Comes in September.   3. HLD   4. Nonobstructive CAD - no chest pain reported.   Patient is agreeable to this plan and will call if any problems develop in the interim.   Burtis Junes, RN, [place]  [address] [place]  [place], [place] [address]  [phone]

## 2013-12-01 ENCOUNTER — Encounter: Payer: Self-pay | Admitting: Internal Medicine

## 2013-12-16 ENCOUNTER — Encounter: Payer: Self-pay | Admitting: Nurse Practitioner

## 2013-12-28 ENCOUNTER — Ambulatory Visit (INDEPENDENT_AMBULATORY_CARE_PROVIDER_SITE_OTHER): Payer: Commercial Managed Care - HMO | Admitting: *Deleted

## 2013-12-28 DIAGNOSIS — I639 Cerebral infarction, unspecified: Secondary | ICD-10-CM

## 2013-12-28 DIAGNOSIS — I6350 Cerebral infarction due to unspecified occlusion or stenosis of unspecified cerebral artery: Secondary | ICD-10-CM

## 2013-12-30 NOTE — Progress Notes (Signed)
Loop recorder 

## 2014-01-10 ENCOUNTER — Inpatient Hospital Stay (HOSPITAL_COMMUNITY)
Admission: EM | Admit: 2014-01-10 | Discharge: 2014-01-12 | DRG: 556 | Disposition: A | Discharge status: Home / Self Care | Payer: Medicare HMO | Attending: Internal Medicine | Admitting: Internal Medicine

## 2014-01-10 ENCOUNTER — Emergency Department (HOSPITAL_COMMUNITY): Payer: Medicare HMO

## 2014-01-10 ENCOUNTER — Encounter (HOSPITAL_COMMUNITY): Payer: Self-pay | Admitting: Emergency Medicine

## 2014-01-10 DIAGNOSIS — E1151 Type 2 diabetes mellitus with diabetic peripheral angiopathy without gangrene: Secondary | ICD-10-CM

## 2014-01-10 DIAGNOSIS — E785 Hyperlipidemia, unspecified: Secondary | ICD-10-CM

## 2014-01-10 DIAGNOSIS — G459 Transient cerebral ischemic attack, unspecified: Secondary | ICD-10-CM

## 2014-01-10 DIAGNOSIS — E119 Type 2 diabetes mellitus without complications: Secondary | ICD-10-CM

## 2014-01-10 DIAGNOSIS — R202 Paresthesia of skin: Secondary | ICD-10-CM

## 2014-01-10 DIAGNOSIS — I6350 Cerebral infarction due to unspecified occlusion or stenosis of unspecified cerebral artery: Secondary | ICD-10-CM

## 2014-01-10 DIAGNOSIS — R209 Unspecified disturbances of skin sensation: Secondary | ICD-10-CM

## 2014-01-10 DIAGNOSIS — I10 Essential (primary) hypertension: Secondary | ICD-10-CM | POA: Diagnosis present

## 2014-01-10 DIAGNOSIS — E1159 Type 2 diabetes mellitus with other circulatory complications: Secondary | ICD-10-CM

## 2014-01-10 DIAGNOSIS — I2510 Atherosclerotic heart disease of native coronary artery without angina pectoris: Secondary | ICD-10-CM | POA: Diagnosis present

## 2014-01-10 DIAGNOSIS — Z79899 Other long term (current) drug therapy: Secondary | ICD-10-CM

## 2014-01-10 DIAGNOSIS — R29898 Other symptoms and signs involving the musculoskeletal system: Principal | ICD-10-CM | POA: Diagnosis present

## 2014-01-10 DIAGNOSIS — I639 Cerebral infarction, unspecified: Secondary | ICD-10-CM

## 2014-01-10 DIAGNOSIS — Z8673 Personal history of transient ischemic attack (TIA), and cerebral infarction without residual deficits: Secondary | ICD-10-CM

## 2014-01-10 DIAGNOSIS — R200 Anesthesia of skin: Secondary | ICD-10-CM

## 2014-01-10 DIAGNOSIS — Z85828 Personal history of other malignant neoplasm of skin: Secondary | ICD-10-CM

## 2014-01-10 DIAGNOSIS — J329 Chronic sinusitis, unspecified: Secondary | ICD-10-CM

## 2014-01-10 LAB — DIFFERENTIAL
Basophils Absolute: 0 10*3/uL (ref 0.0–0.1)
Basophils Relative: 1 % (ref 0–1)
Eosinophils Absolute: 0.2 10*3/uL (ref 0.0–0.7)
Eosinophils Relative: 2 % (ref 0–5)
Lymphocytes Relative: 41 % (ref 12–46)
Lymphs Abs: 3.3 10*3/uL (ref 0.7–4.0)
Monocytes Absolute: 0.8 10*3/uL (ref 0.1–1.0)
Monocytes Relative: 10 % (ref 3–12)
Neutro Abs: 3.8 10*3/uL (ref 1.7–7.7)
Neutrophils Relative %: 46 % (ref 43–77)

## 2014-01-10 LAB — COMPREHENSIVE METABOLIC PANEL
ALT: 15 U/L (ref 0–35)
AST: 15 U/L (ref 0–37)
Albumin: 3.6 g/dL (ref 3.5–5.2)
Alkaline Phosphatase: 95 U/L (ref 39–117)
Anion gap: 12 (ref 5–15)
BUN: 22 mg/dL (ref 6–23)
CO2: 26 mEq/L (ref 19–32)
Calcium: 8.9 mg/dL (ref 8.4–10.5)
Chloride: 104 mEq/L (ref 96–112)
Creatinine, Ser: 1.13 mg/dL — ABNORMAL HIGH (ref 0.50–1.10)
GFR calc Af Amer: 53 mL/min — ABNORMAL LOW (ref >90)
GFR calc non Af Amer: 46 mL/min — ABNORMAL LOW (ref >90)
Glucose, Bld: 111 mg/dL — ABNORMAL HIGH (ref 70–99)
Potassium: 4.1 mEq/L (ref 3.7–5.3)
Sodium: 142 mEq/L (ref 137–147)
Total Bilirubin: <0.2 mg/dL — ABNORMAL LOW (ref 0.3–1.2)
Total Protein: 7.1 g/dL (ref 6.0–8.3)

## 2014-01-10 LAB — CBC
HCT: 38.1 % (ref 36.0–46.0)
Hemoglobin: 12.7 g/dL (ref 12.0–15.0)
MCH: 31.7 pg (ref 26.0–34.0)
MCHC: 33.3 g/dL (ref 30.0–36.0)
MCV: 95 fL (ref 78.0–100.0)
Platelets: 208 10*3/uL (ref 150–400)
RBC: 4.01 MIL/uL (ref 3.87–5.11)
RDW: 13.1 % (ref 11.5–15.5)
WBC: 8.1 10*3/uL (ref 4.0–10.5)

## 2014-01-10 LAB — APTT: aPTT: 27 seconds (ref 24–37)

## 2014-01-10 LAB — CBG MONITORING, ED: Glucose-Capillary: 104 mg/dL — ABNORMAL HIGH (ref 70–99)

## 2014-01-10 LAB — I-STAT TROPONIN, ED: Troponin i, poc: 0.01 ng/mL (ref 0.00–0.08)

## 2014-01-10 LAB — PROTIME-INR
INR: 1.04 (ref 0.00–1.49)
Prothrombin Time: 13.6 seconds (ref 11.6–15.2)

## 2014-01-10 NOTE — ED Notes (Signed)
Neurologist notified of pts new onset of left leg and arm drift.

## 2014-01-10 NOTE — Consult Note (Addendum)
Referring Physician: ED    Chief Complaint: CODE STROKE: LEFT HAND STIFFNESS/WEAKNESS.  HPI:                                                                                                                                         Margaret Vazquez is an 78 y.o. female with a past medical history significant for HTN, DM type II, hyperlipidemia, non obstructive CAD,multifocal infarcts 8/14 in the left frontal and parietal MCA territory with severe proximal left M1 stenosis without known embolic source, s/p loop recorder placement, brought in by EMS as a code stroke due to acute onset of the above stated symptoms. She indicated that she had similar symptoms in the right arm at the time of her prior stroke. Margaret Vazquez expressed that she was home, came out of the tub, when she developed acute onset of painful, uncontrollable, twisting, " drawing up" of her left middle finger and arm. She had total of 3 episodes with similar characteristics except for the absence of the pain on subsequent events. Had trouble using the hand at the time of the episode but in between there was not weakness, tingling, slurred speech, HA, language or vision impairment. CT brain showed no acute intracranial abnormality and NIHSS 0. Takes Ticlid, as she is intolerant of aspirin and plavix. Margaret Vazquez was placed on Keppra at some time in the psat as she had further episodes after discharged form the hospital last year, but couldn't tolerate it an it was stopped.' Date last known well: 01/10/14 Time last known well: 2140 tPA Given: no, symptoms resolved NIHSS: 0   Past Medical History  Diagnosis Date  . Hypertension   . Diabetes mellitus     type II  . Hyperlipidemia     myalgias with Lipitor and Zetia  . GERD (gastroesophageal reflux disease)   . Skin cancer     hx of basal cell/ak's  . Colon polyps 2009  . Allergy history, drug     Aspirin  . Cervical stenosis of spine     With neck pain  . CAD (coronary artery  disease) 04/08/11    non obst by cath  . Migraine   . TIA (transient ischemic attack)   . Stroke August 2014    Past Surgical History  Procedure Laterality Date  . Appendectomy    . Tubal ligation      BTL  . Breast surgery  1992    breast biopsy  . Eye surgery  2005    tear duct surgery  . Nasal sinus surgery  01/2005  . Cardiac catheterization  1993    LHC: normal coronaries.   . Stress cardiolite  11/1999    Normal/ negative  . Cystoscopy w/ decannulation  03/2000    Normal  . Abd [place]  07/2003    Negative  . Tear duct surgery  2005  . Colonoscopy  12/2007    Adenomatous colon polyps  . Cardiac catheterization  04/07/2011    non obst CAD (Dr Burt Knack)  . Tee without cardioversion N/A 02/16/2013    Procedure: TRANSESOPHAGEAL ECHOCARDIOGRAM (TEE);  Surgeon: Josue Hector, MD;  Location: [facility] ENDOSCOPY;  Service: Cardiovascular;  Laterality: N/A;    Family History  Problem Relation Age of Onset  . Lung cancer Brother   . Colon cancer Neg Hx   . Skin cancer Daughter   . Diabetes Sister   . Diabetes Brother   . Heart disease Mother   . Heart disease Father    Social History:  reports that she has never smoked. She has never used smokeless tobacco. She reports that she does not drink alcohol or use illicit drugs.  Allergies:  Allergies  Allergen Reactions  . Bee Venom Hives, Shortness Of Breath and Swelling  . Nabumetone Anaphylaxis  . Amoxicillin-Pot Clavulanate Hives and Swelling    To lips.  . Aspirin Hives  . Atorvastatin Swelling     joint pain/swelling, inc liver tests  . Clopidogrel Bisulfate Hives  . Codeine Nausea And Vomiting  . Ezetimibe Other (See Comments)     fatigue  . Metformin And Related Other (See Comments)    Diarrhea   . Valsartan Other (See Comments)     fatigue    Medications:                                                                                                                           I have reviewed the patient's current  medications.  ROS:                                                                                                                                       History obtained from the patient and chart review.  General ROS: negative for - chills, fatigue, fever, night sweats, or weight loss Psychological ROS: negative for - behavioral disorder, hallucinations, memory difficulties, mood swings or suicidal ideation Ophthalmic ROS: negative for - blurry vision, double vision, eye pain or loss of vision ENT ROS: negative for - epistaxis, nasal discharge, oral lesions, sore throat, tinnitus or vertigo Allergy and Immunology ROS: negative for - hives or itchy/watery eyes Hematological and Lymphatic ROS: negative for - bleeding problems, bruising or swollen lymph nodes Endocrine ROS: negative for - galactorrhea, hair pattern changes, polydipsia/polyuria  or temperature intolerance Respiratory ROS: negative for - cough, hemoptysis, shortness of breath or wheezing Cardiovascular ROS: negative for - chest pain, dyspnea on exertion, edema or irregular heartbeat Gastrointestinal ROS: negative for - abdominal pain, diarrhea, hematemesis, nausea/vomiting or stool incontinence Genito-Urinary ROS: negative for - dysuria, hematuria, incontinence or urinary frequency/urgency Musculoskeletal ROS: negative for - joint swelling or muscular weakness Neurological ROS: as noted in HPI Dermatological ROS: negative for rash and skin lesion changes  Physical exam: pleasant female in no apparent distress. Blood pressure 185/91, pulse 74, temperature 98.4 F (36.9 C), temperature source Oral, resp. rate 20, SpO2 95.00%. Head: normocephalic. Neck: supple, no bruits, no JVD. Cardiac: no murmurs. Lungs: clear. Abdomen: soft, no tender, no mass. Extremities: no edema. CV: pulses palpable throughout  Neurologic Examination:                                                                                                       Mental Status: Alert, oriented, thought content appropriate.  Speech fluent without evidence of aphasia.  Able to follow 3 step commands without difficulty. Cranial Nerves: II: Discs flat bilaterally; Visual fields grossly normal, pupils equal, round, reactive to light and accommodation III,IV, VI: ptosis not present, extra-ocular motions intact bilaterally V,VII: smile symmetric, facial light touch sensation normal bilaterally VIII: hearing normal bilaterally IX,X: gag reflex present XI: bilateral shoulder shrug XII: midline tongue extension without atrophy or fasciculations Motor: Right : Upper extremity   5/5    Left:     Upper extremity   5/5  Lower extremity   5/5     Lower extremity   5/5 Tone and bulk:normal tone throughout; no atrophy noted Sensory: Pinprick and light touch intact throughout, bilaterally Deep Tendon Reflexes:  Right: Upper Extremity   Left: Upper extremity   biceps (C-5 to C-6) 2/4   biceps (C-5 to C-6) 2/4 tricep (C7) 2/4    triceps (C7) 2/4 Brachioradialis (C6) 2/4  Brachioradialis (C6) 2/4  Lower Extremity Lower Extremity  quadriceps (L-2 to L-4) 2/4   quadriceps (L-2 to L-4) 2/4 Achilles (S1) 2/4   Achilles (S1) 2/4 Plantars: Right: downgoing   Left: downgoing Cerebellar: normal finger-to-nose,  normal heel-to-shin test Gait:  No tested    Results for orders placed during the hospital encounter of 01/10/14 (from the past 48 hour(s))  CBG MONITORING, ED     Status: Abnormal   Collection Time    01/10/14 11:08 PM      Result Value Ref Range   Glucose-Capillary 104 (*) 70 - 99 mg/dL   Comment 1 Notify RN     Comment 2 Documented in Chart     No results found.   Assessment: 78 y.o. female with multiple risk factors for stroke, brought in due to recurrent, paroxysmal, stereotype episodes of sudden onset "drawing up" of Lt arm similar to her stroke on the Rt side one yr ago. NIHSS 0. CT brain without acute abnormality. TIA right brain Admit to  medicine and complete TIA work up. Continue Ticlid. Loop recorder was interrogated in the ED and it  seems to be malfunctioning: will need cardiology to evaluate. Stroke team will resume care in am.  Dorian Pod, MD [facility] [phone]  01/10/2014, 11:16 PM   Addendum: Patient experienced a change in neurological status consistent of LUE/LLE drift & ataxia began at 2355 ,with NIHSS 4. CTA brain showed no proximal proximal branch occlusion or intraluminal thrombus within the intracranial circulation. Dx: right brain ischemic stroke showing continuous improvement in subsequent evaluation, NIHSS 2. Mild, non disabling deficits and IV thrombolysis was not offered. Will follow up.   Dorian Pod, MD

## 2014-01-10 NOTE — ED Provider Notes (Signed)
CSN: [id number]     Arrival date & time 01/10/14  2236 History   First MD Initiated Contact with Patient 01/10/14 2302     Chief Complaint  Patient presents with  . Stroke Symptoms  . Code Stroke     (Consider location/radiation/quality/duration/timing/severity/associated sxs/prior Treatment) Patient is a 78 y.o. female presenting with Acute Neurological Problem. The history is provided by the patient.  Cerebrovascular Accident This is a new problem. The current episode started 3 to 5 hours ago. The problem occurs rarely. The problem has been resolved. Pertinent negatives include no chest pain, no abdominal pain, no headaches and no shortness of breath. Nothing aggravates the symptoms. Nothing relieves the symptoms. She has tried nothing for the symptoms. The treatment provided significant relief.  Drawing up her left arm intermittently.  Had similar symptoms with the right arm in the past.  Seen by neuro.  Symptoms resolved admit for TIA  Past Medical History  Diagnosis Date  . Hypertension   . Diabetes mellitus     type II  . Hyperlipidemia     myalgias with Lipitor and Zetia  . GERD (gastroesophageal reflux disease)   . Skin cancer     hx of basal cell/ak's  . Colon polyps 2009  . Allergy history, drug     Aspirin  . Cervical stenosis of spine     With neck pain  . CAD (coronary artery disease) 04/08/11    non obst by cath  . Migraine   . TIA (transient ischemic attack)   . Stroke August 2014   Past Surgical History  Procedure Laterality Date  . Appendectomy    . Tubal ligation      BTL  . Breast surgery  1992    breast biopsy  . Eye surgery  2005    tear duct surgery  . Nasal sinus surgery  01/2005  . Cardiac catheterization  1993    LHC: normal coronaries.   . Stress cardiolite  11/1999    Normal/ negative  . Cystoscopy w/ decannulation  03/2000    Normal  . Abd [place]  07/2003    Negative  . Tear duct surgery  2005  . Colonoscopy  12/2007    Adenomatous colon  polyps  . Cardiac catheterization  04/07/2011    non obst CAD (Dr Burt Knack)  . Tee without cardioversion N/A 02/16/2013    Procedure: TRANSESOPHAGEAL ECHOCARDIOGRAM (TEE);  Surgeon: Josue Hector, MD;  Location: [facility] ENDOSCOPY;  Service: Cardiovascular;  Laterality: N/A;   Family History  Problem Relation Age of Onset  . Lung cancer Brother   . Colon cancer Neg Hx   . Skin cancer Daughter   . Diabetes Sister   . Diabetes Brother   . Heart disease Mother   . Heart disease Father    History  Substance Use Topics  . Smoking status: Never Smoker   . Smokeless tobacco: Never Used  . Alcohol Use: No   OB History   Grav Para Term Preterm Abortions TAB SAB Ect Mult Living                 Review of Systems  Respiratory: Negative for shortness of breath.   Cardiovascular: Negative for chest pain.  Gastrointestinal: Negative for abdominal pain.  Neurological: Negative for headaches.  All other systems reviewed and are negative.     Allergies  Bee venom; Nabumetone; Amoxicillin-pot clavulanate; Aspirin; Atorvastatin; Clopidogrel bisulfate; Codeine; Ezetimibe; Metformin and related; and Valsartan  Home Medications  Prior to Admission medications   Medication Sig Start Date End Date Taking? Authorizing Provider  diphenhydrAMINE (BENADRYL) 25 MG tablet Take 50 mg by mouth every 8 (eight) hours as needed for allergies.     Historical Provider, MD  EPINEPHrine (EPI-PEN) 0.3 mg/0.3 mL DEVI Inject 0.3 mg into the muscle daily as needed (allergic reaction).  12/20/10   Abner Greenspan, MD  glipiZIDE (GLUCOTROL XL) 5 MG 24 hr tablet Take 1 tablet (5 mg total) by mouth daily. 11/04/13 01/25/15  [place], MD  glucose blood test strip Use to check blood sugar one time a day.  Dx: 250.00 09/14/13   Abner Greenspan, MD  olmesartan-hydrochlorothiazide (BENICAR HCT) 20-12.5 MG per tablet Take 1 tablet by mouth daily. 11/14/13   Abner Greenspan, MD  ticlopidine (TICLID) 250 MG tablet Take 1 tablet (250 mg  total) by mouth 2 (two) times daily with a meal. 02/16/13   Ripudeep Krystal Eaton, MD  tobramycin-dexamethasone [facility]) ophthalmic solution Place 1 drop into both eyes as needed. 07/12/13   Historical Provider, MD   BP 179/66  Pulse 65  Temp(Src) 98.4 F (36.9 C) (Oral)  Resp 11  SpO2 96% Physical Exam  Constitutional: She appears well-developed and well-nourished. No distress.  HENT:  Head: Normocephalic and atraumatic.  Mouth/Throat: Oropharynx is clear and moist.  Eyes: Conjunctivae and EOM are normal. Pupils are equal, round, and reactive to light.  Neck: Normal range of motion. Neck supple.  Cardiovascular: Normal rate, regular rhythm, normal heart sounds and intact distal pulses.   Pulmonary/Chest: Effort normal. No respiratory distress. She has no wheezes. She has no rales.  Abdominal: Soft. Bowel sounds are normal. There is no tenderness. There is no rebound and no guarding.  Musculoskeletal: Normal range of motion. She exhibits no edema.  Neurological: She is alert. She has normal reflexes. No cranial nerve deficit.  Skin: Skin is warm and dry.  Psychiatric: She has a normal mood and affect.    ED Course  Procedures (including critical care time) Labs Review Labs Reviewed  CBG MONITORING, ED - Abnormal; Notable for the following:    Glucose-Capillary 104 (*)    All other components within normal limits  CBC  DIFFERENTIAL  PROTIME-INR  APTT  COMPREHENSIVE METABOLIC PANEL  URINALYSIS, ROUTINE W REFLEX MICROSCOPIC  I-STAT TROPOININ, ED  I-STAT TROPOININ, ED    Imaging Review No results found.   EKG Interpretation   Date/Time:  Tuesday January 10 2014 23:22:56 EDT Ventricular Rate:  63 PR Interval:  180 QRS Duration: 96 QT Interval:  409 QTC Calculation: 419 R Axis:   51 Text Interpretation:  Sinus rhythm Abnormal R-wave progression, early  transition Confirmed by [facility]  MD, Jaamal Farooqui ([id number]) on 01/10/2014  11:28:41 PM      MDM   Final diagnoses:  H/O TIA  (transient ischemic attack) and stroke    admit    Tyreck Bell K Shauntel Prest-Rasch, MD 01/10/14 2332

## 2014-01-10 NOTE — ED Notes (Signed)
Patient here with complaint of stroke like symptoms similar to previous stroke. Current symptoms include "drawing" of left arm with weakness of that arm. Rancour MD called to bedside to evaluate. Last known well about 1 hr ago.

## 2014-01-10 NOTE — Code Documentation (Signed)
78 yo wf brought to the [facility] via pvt vehicle after sudden onset "drawing up" of Lt arm similar to her stroke on the Rt side one yr ago.  On assessment pt has resolved & is NIH 0.  Pt is on TIA alert.  See doc flowsheets for code stroke times.

## 2014-01-10 NOTE — ED Provider Notes (Signed)
She reports intermittent "drawing up" of her left middle finger that started about one hour ago. This is coming and going. She had similar symptoms on the right side and she had a TIA. On exam patient has reduced grip strength and flexion and extension of her forearm. Smile is symmetric. Lower extremity strength is symmetric. Code stroke activated   Ezequiel Essex, MD 01/10/14 2252

## 2014-01-11 ENCOUNTER — Inpatient Hospital Stay (HOSPITAL_COMMUNITY): Payer: Medicare HMO

## 2014-01-11 ENCOUNTER — Encounter (HOSPITAL_COMMUNITY): Payer: Self-pay | Admitting: Internal Medicine

## 2014-01-11 ENCOUNTER — Emergency Department (HOSPITAL_COMMUNITY): Payer: Medicare HMO

## 2014-01-11 DIAGNOSIS — I639 Cerebral infarction, unspecified: Secondary | ICD-10-CM

## 2014-01-11 DIAGNOSIS — R209 Unspecified disturbances of skin sensation: Secondary | ICD-10-CM

## 2014-01-11 DIAGNOSIS — Z8673 Personal history of transient ischemic attack (TIA), and cerebral infarction without residual deficits: Secondary | ICD-10-CM

## 2014-01-11 DIAGNOSIS — I10 Essential (primary) hypertension: Secondary | ICD-10-CM

## 2014-01-11 DIAGNOSIS — I517 Cardiomegaly: Secondary | ICD-10-CM

## 2014-01-11 DIAGNOSIS — J329 Chronic sinusitis, unspecified: Secondary | ICD-10-CM

## 2014-01-11 DIAGNOSIS — E119 Type 2 diabetes mellitus without complications: Secondary | ICD-10-CM

## 2014-01-11 DIAGNOSIS — G459 Transient cerebral ischemic attack, unspecified: Secondary | ICD-10-CM | POA: Insufficient documentation

## 2014-01-11 DIAGNOSIS — E785 Hyperlipidemia, unspecified: Secondary | ICD-10-CM

## 2014-01-11 LAB — CBC WITH DIFFERENTIAL/PLATELET
Basophils Absolute: 0 10*3/uL (ref 0.0–0.1)
Basophils Relative: 1 % (ref 0–1)
Eosinophils Absolute: 0.1 10*3/uL (ref 0.0–0.7)
Eosinophils Relative: 3 % (ref 0–5)
HCT: 36.4 % (ref 36.0–46.0)
Hemoglobin: 12.2 g/dL (ref 12.0–15.0)
Lymphocytes Relative: 43 % (ref 12–46)
Lymphs Abs: 2.2 10*3/uL (ref 0.7–4.0)
MCH: 31.6 pg (ref 26.0–34.0)
MCHC: 33.5 g/dL (ref 30.0–36.0)
MCV: 94.3 fL (ref 78.0–100.0)
Monocytes Absolute: 0.4 10*3/uL (ref 0.1–1.0)
Monocytes Relative: 8 % (ref 3–12)
Neutro Abs: 2.3 10*3/uL (ref 1.7–7.7)
Neutrophils Relative %: 45 % (ref 43–77)
Platelets: 202 10*3/uL (ref 150–400)
RBC: 3.86 MIL/uL — ABNORMAL LOW (ref 3.87–5.11)
RDW: 13 % (ref 11.5–15.5)
WBC: 5.1 10*3/uL (ref 4.0–10.5)

## 2014-01-11 LAB — HEMOGLOBIN A1C
Hgb A1c MFr Bld: 6.5 % — ABNORMAL HIGH (ref <5.7)
Mean Plasma Glucose: 140 mg/dL — ABNORMAL HIGH (ref <117)

## 2014-01-11 LAB — COMPREHENSIVE METABOLIC PANEL
ALT: 13 U/L (ref 0–35)
AST: 15 U/L (ref 0–37)
Albumin: 3.3 g/dL — ABNORMAL LOW (ref 3.5–5.2)
Alkaline Phosphatase: 91 U/L (ref 39–117)
Anion gap: 13 (ref 5–15)
BUN: 17 mg/dL (ref 6–23)
CO2: 23 mEq/L (ref 19–32)
Calcium: 8.6 mg/dL (ref 8.4–10.5)
Chloride: 104 mEq/L (ref 96–112)
Creatinine, Ser: 0.86 mg/dL (ref 0.50–1.10)
GFR calc Af Amer: 74 mL/min — ABNORMAL LOW (ref >90)
GFR calc non Af Amer: 64 mL/min — ABNORMAL LOW (ref >90)
Glucose, Bld: 110 mg/dL — ABNORMAL HIGH (ref 70–99)
Potassium: 4.1 mEq/L (ref 3.7–5.3)
Sodium: 140 mEq/L (ref 137–147)
Total Bilirubin: 0.2 mg/dL — ABNORMAL LOW (ref 0.3–1.2)
Total Protein: 6.7 g/dL (ref 6.0–8.3)

## 2014-01-11 LAB — LIPID PANEL
Cholesterol: 154 mg/dL (ref 0–200)
HDL: 72 mg/dL (ref >39)
LDL Cholesterol: 62 mg/dL (ref 0–99)
Total CHOL/HDL Ratio: 2.1 RATIO
Triglycerides: 101 mg/dL (ref <150)
VLDL: 20 mg/dL (ref 0–40)

## 2014-01-11 LAB — URINALYSIS, ROUTINE W REFLEX MICROSCOPIC
Bilirubin Urine: NEGATIVE
Glucose, UA: NEGATIVE mg/dL
Hgb urine dipstick: NEGATIVE
Ketones, ur: NEGATIVE mg/dL
Leukocytes, UA: NEGATIVE
Nitrite: NEGATIVE
Protein, ur: NEGATIVE mg/dL
Specific Gravity, Urine: 1.017 (ref 1.005–1.030)
Urobilinogen, UA: 0.2 mg/dL (ref 0.0–1.0)
pH: 7 (ref 5.0–8.0)

## 2014-01-11 LAB — GLUCOSE, CAPILLARY
Glucose-Capillary: 96 mg/dL (ref 70–99)
Glucose-Capillary: 91 mg/dL (ref 70–99)
Glucose-Capillary: 120 mg/dL — ABNORMAL HIGH (ref 70–99)
Glucose-Capillary: 144 mg/dL — ABNORMAL HIGH (ref 70–99)

## 2014-01-11 LAB — TSH: TSH: 3.47 u[IU]/mL (ref 0.350–4.500)

## 2014-01-11 MED ORDER — SODIUM CHLORIDE 0.9 % IV SOLN
INTRAVENOUS | Status: AC
  Administered 2014-01-11: 06:00:00 via INTRAVENOUS

## 2014-01-11 MED ORDER — DIPHENHYDRAMINE HCL 25 MG PO CAPS: 50.0000 mg | ORAL_CAPSULE | Freq: Three times a day (TID) | ORAL | Status: DC | PRN

## 2014-01-11 MED ORDER — TICLOPIDINE HCL 250 MG PO TABS
250.0000 mg | ORAL_TABLET | Freq: Two times a day (BID) | ORAL | Status: DC
  Administered 2014-01-11 – 2014-01-12 (×3): 250 mg via ORAL
  Filled 2014-01-11 (×5): qty 1

## 2014-01-11 MED ORDER — SODIUM CHLORIDE 0.9 % IV BOLUS (SEPSIS)
500.0000 mL | Freq: Once | INTRAVENOUS | Status: AC
  Administered 2014-01-11: 500 mL via INTRAVENOUS

## 2014-01-11 MED ORDER — ENOXAPARIN SODIUM 40 MG/0.4ML ~~LOC~~ SOLN
40.0000 mg | SUBCUTANEOUS | Status: DC
  Administered 2014-01-11 – 2014-01-12 (×2): 40 mg via SUBCUTANEOUS
  Filled 2014-01-11 (×2): qty 0.4

## 2014-01-11 MED ORDER — INSULIN ASPART 100 UNIT/ML ~~LOC~~ SOLN: 0.0000 [IU] | Freq: Three times a day (TID) | SUBCUTANEOUS | Status: DC

## 2014-01-11 MED ORDER — STROKE: EARLY STAGES OF RECOVERY BOOK
Freq: Once | Status: AC
  Administered 2014-01-11: 03:00:00
  Filled 2014-01-11: qty 1

## 2014-01-11 MED ORDER — TOBRAMYCIN-DEXAMETHASONE 0.3-0.1 % OP SUSP: 1.0000 [drp] | Freq: Every day | OPHTHALMIC | Status: DC | PRN

## 2014-01-11 MED ORDER — SENNOSIDES-DOCUSATE SODIUM 8.6-50 MG PO TABS: 1.0000 | ORAL_TABLET | Freq: Every evening | ORAL | Status: DC | PRN

## 2014-01-11 MED ORDER — IOHEXOL 350 MG/ML SOLN
50.0000 mL | Freq: Once | INTRAVENOUS | Status: AC | PRN
  Administered 2014-01-11: 50 mL via INTRAVENOUS

## 2014-01-11 MED ORDER — GLIPIZIDE ER 5 MG PO TB24
5.0000 mg | ORAL_TABLET | Freq: Every day | ORAL | Status: DC
  Administered 2014-01-11: 5 mg via ORAL
  Filled 2014-01-11 (×2): qty 1

## 2014-01-11 NOTE — Evaluation (Signed)
Occupational Therapy Evaluation Patient Details Name: Margaret Vazquez MRN: [id number] DOB: [birth year] Today's Date: 01/11/2014    History of Present Illness Margaret Vazquez. Mancinelli is a 78 y.o. Female admitted 01/10/14 for Left arm twisting and drawing movements. PMH of HTN, DM, hyperlipidemia who was brought to the ER after patient had recurrent episodes of twisting movements of her left hand where in her hand was drawn in middle finger stretched out. These episodes happen at least three times in the interval of 10 minutes.  Following admission, pt developed weakness in LUE and LLE. CT was negative. MRI pending.   Clinical Impression   PTA pt lived at home and was independent with ADLs and functional mobility. Pt is overall at Mod Independent level for ADLs and presents with decreased strength in non-dominant LUE. Educated pt on use of bi-manual tasks for LUE strengthening and recommend OP OT (neuro) to increase strength. Pt reports the last time this occurred (on Right side) it resolved over a few months.     Follow Up Recommendations  Outpatient OT;Supervision/Assistance - 24 hour    Equipment Recommendations  None recommended by OT       Precautions / Restrictions Precautions Precautions: Fall Restrictions Weight Bearing Restrictions: No      Mobility Bed Mobility Overal bed mobility: Modified Independent                Transfers Overall transfer level: Modified independent Equipment used: None             General transfer comment: No sway or LOB.     Balance Overall balance assessment: No apparent balance deficits (not formally assessed)                                          ADL Overall ADL's : Modified independent                                       General ADL Comments: Pt overall at Mod Independent level for ADLs. Educated pt on safety with ADLs and use of bi-manual tasks to increase strength in UEs.      Vision  Pt  reports no change from baseline.   No apparent visual deficits.                  Perception Perception Perception Tested?: No   Praxis Praxis Praxis tested?: Within functional limits    Pertinent Vitals/Pain NAD     Hand Dominance Right   Extremity/Trunk Assessment Upper Extremity Assessment Upper Extremity Assessment: LUE deficits/detail LUE Deficits / Details: Shoulder flexion 3/5, bicep and tricep 4/5, grip strength 4/5. Slight edema noted in LUE from mid forearm down to hand.  LUE Coordination: decreased gross motor   Lower Extremity Assessment Lower Extremity Assessment: Defer to PT evaluation   Cervical / Trunk Assessment Cervical / Trunk Assessment: Normal   Communication Communication Communication: No difficulties   Cognition Arousal/Alertness: Awake/alert Behavior During Therapy: WFL for tasks assessed/performed Overall Cognitive Status: Within Functional Limits for tasks assessed                                Home Living Family/patient expects to be discharged to:: Private residence Living Arrangements: Spouse/significant other Available Help  at Discharge: Family;Available 24 hours/day Type of Home: House Home Access: Stairs to enter [employer] of Steps: 4 Entrance Stairs-Rails: Left Home Layout: Two level;Able to live on main level with bedroom/bathroom;Laundry or work area in Building surveyor of Steps: flight Alternate Level Stairs-Rails: Left Bathroom Shower/Tub: Teacher, early years/pre: Standard     Home Equipment: Environmental consultant - 2 wheels;Bedside commode;Tub bench;Shower seat          Prior Functioning/Environment Level of Independence: Independent                                       End of Session Equipment Utilized During Treatment: Gait belt  Activity Tolerance: Patient tolerated treatment well Patient left: in bed;with call bell/phone within reach;Other  (comment);with family/visitor present (with Lab staff)   Time: 4008-6761 OT Time Calculation (min): 24 min Charges:  OT General Charges $OT Visit: 1 Procedure OT Evaluation $Initial OT Evaluation Tier I: 1 Procedure OT Treatments $Self Care/Home Management : 8-22 mins  Juluis Rainier [phone] 01/11/2014, 2:03 PM

## 2014-01-11 NOTE — Progress Notes (Signed)
EEg completed, results pending.

## 2014-01-11 NOTE — ED Notes (Signed)
Pt transported to CT by this RN.

## 2014-01-11 NOTE — Progress Notes (Signed)
PROGRESS NOTE  Margaret Vazquez [id number] DOB: 1936/03/20 DOA: 01/10/2014 PCP: Loura Pardon, MD  Assessment/Plan: Sensory disturbance/left arm weakness -Await results of MRI brain -CT angiogram of the brain was negative for possible branch occlusion or intraluminal thrombus within the intracranial circulation -CT brain negative for acute abnormalities- - 01/10/2014 hemoglobin A1c--6.5  -Carotid duplex negative for hemodynamically significant lesions  -Echocardiogram EF [id number], grade 1 diastolic dysfunction, no source of emboli  -interrogation of loop recorder--neg for dysrhythmia, functioning properly -01/11/2014 EEG negative for seizure activity  -continue Ticlid -LDL 62 Diabetes mellitus type 2  -Hemoglobin A1c 6.5  -NovoLog sliding scale while in hospital  -Discontinue glipizide  Hypertension  -Restart ARB/HCTZ  Family Communication:   Pt at beside Disposition Plan:   Home when medically stable       Procedures/Studies: Ct Angio Head W/cm &/or Wo Cm  01/11/2014   CLINICAL DATA:  Code stroke. Left upper extremity symptoms. Similar to previous right-sided stroke 1 year ago.  EXAM: CT ANGIOGRAPHY HEAD  TECHNIQUE: Multidetector CT imaging of the head was performed using the standard protocol during bolus administration of intravenous contrast. Multiplanar CT image reconstructions and MIPs were obtained to evaluate the vascular anatomy.  CONTRAST:  85m OMNIPAQUE IOHEXOL 350 MG/ML SOLN  COMPARISON:  Prior CT performed earlier on the same day as well as prior MRI from 04/25/2013.  FINDINGS: ANTERIOR CIRCULATION:  The visualized portions of the distal cervical segments of the internal carotid arteries are well opacified and widely patent. The petrous segments are within normal limits. Scattered calcified atheromatous disease seen within the cavernous segments of the internal carotid arteries bilaterally without associated hemodynamically significant stenosis. Supra clinoid  segments well opacified bilaterally.  A1 segments, anterior communicating artery, and anterior cerebral arteries are normal.  Moderate to severe stenosis within the proximal left M1 segment is grossly similar relative to prior MRA (series 5010, image 53). M1 segment is well opacified distally. Multi focal atherosclerotic irregularity seen within the distal left MCA branches.  Mild multi focal irregularity seen within the right M1 segment which is well opacified. No proximal branch occlusion. Right MCA bifurcation within normal limits. Distal right MCA branches are opacified.  No intracranial aneurysm identified appear  POSTERIOR CIRCULATION:  The left vertebral artery is dominant. The right vertebral artery appears to terminate and height via. Posterior inferior cerebral arteries are otherwise normal. Vertebrobasilar junction and basilar artery are within normal limits. Multi focal atherosclerotic irregularity seen within the P2 segments bilaterally without hemodynamically significant stenosis. Superior cerebellar arteries grossly normal. Small posterior communicating arteries seen bilaterally.  No intracranial aneurysm identified.  Review of the MIP images confirms the above findings.  Pansinus disease again noted. No other acute intracranial abnormality. Atrophy and white matter disease is unchanged.  IMPRESSION: 1. No evidence of proximal branch occlusion or intraluminal thrombus within the intracranial circulation. 2. Grossly stable moderate to severe high-grade stenosis within the proximal left M1 segment. 3. No other acute intracranial abnormality. 4. Pansinusitis.   Electronically Signed   By: BJeannine BogaM.D.   On: 01/11/2014 01:15   Dg Chest 2 View  01/11/2014   CLINICAL DATA:  Stroke  EXAM: CHEST  2 VIEW  COMPARISON:  08/22/2013  FINDINGS: Heart size upper normal. Negative for heart failure. Loop recorder noted overlying the left chest.  Negative for pulmonary edema.  No infiltrate or effusion  is present.  IMPRESSION: No active cardiopulmonary disease.   Electronically Signed  By: Franchot Gallo M.D.   On: 01/11/2014 08:19   Ct Head (brain) Wo Contrast  01/10/2014   CLINICAL DATA:  Acute stroke.  Left arm weakness.  EXAM: CT HEAD WITHOUT CONTRAST  TECHNIQUE: Contiguous axial images were obtained from the base of the skull through the vertex without intravenous contrast.  COMPARISON:  08/26/13  FINDINGS: No evidence of intracranial hemorrhage, brain edema, or other signs of acute infarction. No evidence of intracranial mass lesion or mass effect. No abnormal extraaxial fluid collections identified. Ventricles are normal in size. No skull abnormality identified.  Near complete opacification of the paranasal sinuses seen with new air-fluid levels seen in the left frontal sinus.  IMPRESSION: No evidence of intracranial abnormality.  Pansinusitis.  These results were called by telephone at the time of interpretation on 01/10/2014 at 11:46 PM to Dr. Armida Sans , who verbally acknowledged these results.   Electronically Signed   By: Earle Gell M.D.   On: 01/10/2014 23:47   Mr Brain Wo Contrast  01/11/2014   CLINICAL DATA:  Abnormal motions of the left hand.  Stroke.  EXAM: MRI HEAD WITHOUT CONTRAST  TECHNIQUE: Multiplanar, multiecho pulse sequences of the brain and surrounding structures were obtained without intravenous contrast.  COMPARISON:  CT angiography same day.  MRI 04/25/2013.  FINDINGS: Diffusion imaging does not show any acute or subacute infarction. The brainstem and cerebellum are normal. The cerebral hemispheres show mild chronic small-vessel ischemic changes within the deep white matter, less than often seen in healthy individuals of this age. No abnormality seen affecting the thalamus or basal ganglia. No mass lesion, hemorrhage, hydrocephalus or extra-axial collection. There is extensive inflammatory disease throughout the paranasal sinuses with all of the sinuses being opacified with the  exception of the right division of the sphenoid sinus. No pituitary mass. No skull base lesion.  IMPRESSION: No acute brain finding. Minor chronic appearing small vessel changes of the cerebral hemispheric white matter, less than often seen in healthy individuals of this age.  Inflammatory disease affecting the paranasal sinuses diffusely.   Electronically Signed   By: Nelson Chimes M.D.   On: 01/11/2014 15:10         Subjective: Patient is feeling better today patient is no longer having any weakness or unusual movements of her left hand or left arm. Denies fevers, chills, chest pain, shortness breath, nausea, vomiting, diarrhea, abdominal pain   Objective: Filed Vitals:   01/11/14 0800 01/11/14 1005 01/11/14 1201 01/11/14 1439  BP: 131/52 138/53 163/76 153/58  Pulse: 60 60 57 57  Temp: 98.5 F (36.9 C) 98.4 F (36.9 C) 98.1 F (36.7 C) 98.2 F (36.8 C)  TempSrc: Oral Oral Oral Oral  Resp: '18 16 18 18  '$ Height:      Weight:      SpO2: 96% 97% 96% 98%   No intake or output data in the 24 hours ending 01/11/14 1514 Weight change:  Exam:   General:  Pt is alert, follows commands appropriately, not in acute distress  HEENT: No icterus, No thrush, [place]/AT  Cardiovascular: RRR, S1/S2, no rubs, no gallops  Respiratory: CTA bilaterally, no wheezing, no crackles, no rhonchi  Abdomen: Soft/+BS, non tender, non distended, no guarding  Extremities: No edema, No lymphangitis, No petechiae,  no synovitis Neuro:  CN II-XII intact, strength 4/5 in RUE, RLE, strength 4/5 LUE, LLE; sensation intact bilateral; no dysmetria; babinski equivocal  Data Reviewed: Basic Metabolic Panel:  Recent Labs Lab 01/10/14 2305 01/11/14 1058  NA  142 140  K 4.1 4.1  CL 104 104  CO2 26 23  GLUCOSE 111* 110*  BUN 22 17  CREATININE 1.13* 0.86  CALCIUM 8.9 8.6   Liver Function Tests:  Recent Labs Lab 01/10/14 2305 01/11/14 1058  AST 15 15  ALT 15 13  ALKPHOS 95 91  BILITOT <0.2* 0.2*  PROT  7.1 6.7  ALBUMIN 3.6 3.3*   No results found for this basename: LIPASE, AMYLASE,  in the last 168 hours No results found for this basename: AMMONIA,  in the last 168 hours CBC:  Recent Labs Lab 01/10/14 2305 01/11/14 1058  WBC 8.1 5.1  NEUTROABS 3.8 2.3  HGB 12.7 12.2  HCT 38.1 36.4  MCV 95.0 94.3  PLT 208 202   Cardiac Enzymes: No results found for this basename: CKTOTAL, CKMB, CKMBINDEX, TROPONINI,  in the last 168 hours BNP: No components found with this basename: POCBNP,  CBG:  Recent Labs Lab 01/10/14 2308 01/11/14 0237 01/11/14 1201  GLUCAP 104* 96 91    No results found for this or any previous visit (from the past 240 hour(s)).   Scheduled Meds: . enoxaparin (LOVENOX) injection  40 mg Subcutaneous Q24H  . glipiZIDE  5 mg Oral Q breakfast  . insulin aspart  0-9 Units Subcutaneous TID WC  . ticlopidine  250 mg Oral BID WC   Continuous Infusions: . sodium chloride 50 mL/hr at 01/11/14 1022     Emmaly Leech, DO  [facility] Pager [phone]  If 7PM-7AM, please contact night-coverage [email or web address] Password [email or web address] 01/11/2014, 3:14 PM   LOS: 1 day

## 2014-01-11 NOTE — ED Notes (Addendum)
Rapid response April at the bedside. Dr. Armida Sans at the bedside.

## 2014-01-11 NOTE — Procedures (Signed)
History: 78 yo with transient left hand stiffness and weakness.   Technique: This is a 17 channel routine scalp EEG performed at the bedside with bipolar and monopolar montages arranged in accordance to the international 10/20 system of electrode placement. One channel was dedicated to EKG recording.    Background: There is a well defined posterior dominant rhythm of 8.5 Hz that attenuates with eye opening. There is an increase in delta with drowsiness and normal distribution of sleep structures was seen.   Photic stimulation: Physiologic driving is not performed  EEG Abnormalities: None  Clinical Interpretation: This normal EEG is recorded in the waking and sleep state. There was no seizure or seizure predisposition recorded on this study.   Roland Rack, MD [facility] [phone]  If 7pm- 7am, please page neurology on call as listed in [place].

## 2014-01-11 NOTE — H&P (Signed)
[facility] History and Physical  TALLI KIMMER ERX:[id number] DOB: 12/20/1935 DOA: 01/10/2014  Referring physician: ER physician. PCP: Loura Pardon, MD   Chief Complaint: Left arm twisting and drawing movements.  HPI: Margaret Vazquez is a 78 y.o. female with history of hypertension diabetes mellitus, hyperlipidemia(intolerant to statins) was brought to the ER after patient had recurrent episodes of twisting movements of her left hand where in her hand was drawn in middle finger stretched out. These episodes happen at least thrice in the interval of 10 minutes at around 9:40 PM. Patient did not have any headache nausea vomiting loss of consciousness palpitations chest pain or shortness of breath. Patient in the ER was found to be nonfocal. CTA did not show anything acute. Neurologist on-call Dr. Aram Beecham was consulted. Patient was initially planned to be admitted for possible TIA but later around 11:55 PM patient started developing weakness of the left upper and lower extremities with ataxia and CT angiogram of the head and neck was done which did not show any acute occlusion. Patient has been admitted for further workup for CVA. Patient has had similar previous episode involving the right upper extremity last year. Patient also was placed on loop recorder to check for any arrhythmias. Patient denies any weakness of the other extremities. Denies any chest pain shortness of breath.   Review of Systems: As presented in the history of presenting illness, rest negative.  Past Medical History  Diagnosis Date  . Hypertension   . Diabetes mellitus     type II  . Hyperlipidemia     myalgias with Lipitor and Zetia  . GERD (gastroesophageal reflux disease)   . Skin cancer     hx of basal cell/ak's  . Colon polyps 2009  . Allergy history, drug     Aspirin  . Cervical stenosis of spine     With neck pain  . CAD (coronary artery disease) 04/08/11    non obst by cath  . Migraine   . TIA  (transient ischemic attack)   . Stroke August 2014   Past Surgical History  Procedure Laterality Date  . Appendectomy    . Tubal ligation      BTL  . Breast surgery  1992    breast biopsy  . Eye surgery  2005    tear duct surgery  . Nasal sinus surgery  01/2005  . Cardiac catheterization  1993    LHC: normal coronaries.   . Stress cardiolite  11/1999    Normal/ negative  . Cystoscopy w/ decannulation  03/2000    Normal  . Abd [place]  07/2003    Negative  . Tear duct surgery  2005  . Colonoscopy  12/2007    Adenomatous colon polyps  . Cardiac catheterization  04/07/2011    non obst CAD (Dr Burt Knack)  . Tee without cardioversion N/A 02/16/2013    Procedure: TRANSESOPHAGEAL ECHOCARDIOGRAM (TEE);  Surgeon: Josue Hector, MD;  Location: [facility] ENDOSCOPY;  Service: Cardiovascular;  Laterality: N/A;   Social History:  reports that she has never smoked. She has never used smokeless tobacco. She reports that she does not drink alcohol or use illicit drugs. Where does patient live home. Can patient participate in ADLs? Yes.  Allergies  Allergen Reactions  . Bee Venom Hives, Shortness Of Breath and Swelling  . Nabumetone Anaphylaxis  . Amoxicillin-Pot Clavulanate Hives and Swelling    To lips.  . Aspirin Hives  . Atorvastatin Swelling     joint  pain/swelling, inc liver tests  . Clopidogrel Bisulfate Hives  . Codeine Nausea And Vomiting  . Ezetimibe Other (See Comments)     fatigue  . Metformin And Related Other (See Comments)    Diarrhea   . Valsartan Other (See Comments)     fatigue    Family History:  Family History  Problem Relation Age of Onset  . Lung cancer Brother   . Colon cancer Neg Hx   . Skin cancer Daughter   . Diabetes Sister   . Diabetes Brother   . Heart disease Mother   . Heart disease Father       Prior to Admission medications   Medication Sig Start Date End Date Taking? Authorizing Provider  diphenhydrAMINE (BENADRYL) 25 MG tablet Take 50 mg by mouth  every 8 (eight) hours as needed for allergies.    Yes Historical Provider, MD  EPINEPHrine (EPI-PEN) 0.3 mg/0.3 mL DEVI Inject 0.3 mg into the muscle daily as needed (allergic reaction).  12/20/10  Yes [place], MD  glipiZIDE (GLUCOTROL XL) 5 MG 24 hr tablet Take 1 tablet (5 mg total) by mouth daily. 11/04/13 01/25/15 Yes [place], MD  olmesartan-hydrochlorothiazide (BENICAR HCT) 20-12.5 MG per tablet Take 1 tablet by mouth daily. 11/14/13  Yes Abner Greenspan, MD  ticlopidine (TICLID) 250 MG tablet Take 1 tablet (250 mg total) by mouth 2 (two) times daily with a meal. 02/16/13  Yes Ripudeep Krystal Eaton, MD  tobramycin-dexamethasone [facility]) ophthalmic solution Place 1 drop into both eyes daily as needed (dryness).  07/12/13  Yes Historical Provider, MD    Physical Exam: Filed Vitals:   01/11/14 0015 01/11/14 0030 01/11/14 0045 01/11/14 0100  BP: 170/60 142/59 154/55 168/58  Pulse: 66 68 61 78  Temp:      TempSrc:      Resp: 15 14 16 18   Height:  5' 8.9" (1.75 m)    Weight:  91.7 kg (202 lb 2.6 oz)    SpO2: 96% 95% 95% 95%     General:  Well-developed and nourished.  Eyes: Anicteric no pallor.  ENT: No discharge from the ears eyes nose mouth.  Neck: No mass felt. No neck rigidity.  Cardiovascular: S1-S2 heard.  Respiratory: No rhonchi or crepitations.  Abdomen: Soft nontender bowel sounds present. No guarding rigidity.  Skin: Erythematous rash of the left middle finger which patient states has been a birthmark.  Musculoskeletal: No edema.  Psychiatric: Appears normal.  Neurologic: Alert awake oriented to time place and person. Initial exam showed strength 5 x 5 in all extremities. There was no facial symmetry. Tongue is midline. PERRLA positive.  Labs on Admission:  Basic Metabolic Panel:  Recent Labs Lab 01/10/14 2305  NA 142  K 4.1  CL 104  CO2 26  GLUCOSE 111*  BUN 22  CREATININE 1.13*  CALCIUM 8.9   Liver Function Tests:  Recent Labs Lab 01/10/14 2305   AST 15  ALT 15  ALKPHOS 95  BILITOT <0.2*  PROT 7.1  ALBUMIN 3.6   No results found for this basename: LIPASE, AMYLASE,  in the last 168 hours No results found for this basename: AMMONIA,  in the last 168 hours CBC:  Recent Labs Lab 01/10/14 2305  WBC 8.1  NEUTROABS 3.8  HGB 12.7  HCT 38.1  MCV 95.0  PLT 208   Cardiac Enzymes: No results found for this basename: CKTOTAL, CKMB, CKMBINDEX, TROPONINI,  in the last 168 hours  BNP (last 3 results)  No results found for this basename: PROBNP,  in the last 8760 hours CBG:  Recent Labs Lab 01/10/14 2308  GLUCAP 104*    Radiological Exams on Admission: Ct Angio Head W/cm &/or Wo Cm  01/11/2014   CLINICAL DATA:  Code stroke. Left upper extremity symptoms. Similar to previous right-sided stroke 1 year ago.  EXAM: CT ANGIOGRAPHY HEAD  TECHNIQUE: Multidetector CT imaging of the head was performed using the standard protocol during bolus administration of intravenous contrast. Multiplanar CT image reconstructions and MIPs were obtained to evaluate the vascular anatomy.  CONTRAST:  70mL OMNIPAQUE IOHEXOL 350 MG/ML SOLN  COMPARISON:  Prior CT performed earlier on the same day as well as prior MRI from 04/25/2013.  FINDINGS: ANTERIOR CIRCULATION:  The visualized portions of the distal cervical segments of the internal carotid arteries are well opacified and widely patent. The petrous segments are within normal limits. Scattered calcified atheromatous disease seen within the cavernous segments of the internal carotid arteries bilaterally without associated hemodynamically significant stenosis. Supra clinoid segments well opacified bilaterally.  A1 segments, anterior communicating artery, and anterior cerebral arteries are normal.  Moderate to severe stenosis within the proximal left M1 segment is grossly similar relative to prior MRA (series 5010, image 53). M1 segment is well opacified distally. Multi focal atherosclerotic irregularity seen within  the distal left MCA branches.  Mild multi focal irregularity seen within the right M1 segment which is well opacified. No proximal branch occlusion. Right MCA bifurcation within normal limits. Distal right MCA branches are opacified.  No intracranial aneurysm identified appear  POSTERIOR CIRCULATION:  The left vertebral artery is dominant. The right vertebral artery appears to terminate and height via. Posterior inferior cerebral arteries are otherwise normal. Vertebrobasilar junction and basilar artery are within normal limits. Multi focal atherosclerotic irregularity seen within the P2 segments bilaterally without hemodynamically significant stenosis. Superior cerebellar arteries grossly normal. Small posterior communicating arteries seen bilaterally.  No intracranial aneurysm identified.  Review of the MIP images confirms the above findings.  Pansinus disease again noted. No other acute intracranial abnormality. Atrophy and white matter disease is unchanged.  IMPRESSION: 1. No evidence of proximal branch occlusion or intraluminal thrombus within the intracranial circulation. 2. Grossly stable moderate to severe high-grade stenosis within the proximal left M1 segment. 3. No other acute intracranial abnormality. 4. Pansinusitis.   Electronically Signed   By: Jeannine Boga M.D.   On: 01/11/2014 01:15   Ct Head (brain) Wo Contrast  01/10/2014   CLINICAL DATA:  Acute stroke.  Left arm weakness.  EXAM: CT HEAD WITHOUT CONTRAST  TECHNIQUE: Contiguous axial images were obtained from the base of the skull through the vertex without intravenous contrast.  COMPARISON:  08/26/13  FINDINGS: No evidence of intracranial hemorrhage, brain edema, or other signs of acute infarction. No evidence of intracranial mass lesion or mass effect. No abnormal extraaxial fluid collections identified. Ventricles are normal in size. No skull abnormality identified.  Near complete opacification of the paranasal sinuses seen with new  air-fluid levels seen in the left frontal sinus.  IMPRESSION: No evidence of intracranial abnormality.  Pansinusitis.  These results were called by telephone at the time of interpretation on 01/10/2014 at 11:46 PM to Dr. Armida Sans , who verbally acknowledged these results.   Electronically Signed   By: Earle Gell M.D.   On: 01/10/2014 23:47    EKG: Independently reviewed. Normal sinus rhythm with abnormal R-wave progression.  Assessment/Plan Principal Problem:   CVA (cerebral infarction) Active Problems:  DIABETES MELLITUS, TYPE II   HYPERTENSION, ESSENTIAL NOS   1. CVA - patient has been placed on neurochecks, swallow evaluation. Check MRI/MRA brain 2-D echo and carotid Doppler. Patient is intolerant to aspirin and Plavix and has been continued on Ticlid. 2. Diabetes mellitus type 2 - continue home medications with sliding-scale coverage. 3. Hypertension - holding off antihypertensives for permissive hypertension. Closely observe blood pressure trends.    Code Status: Full code.  Family Communication: Patient's family at the bedside.  Disposition Plan: Admit to inpatient.    Mazzy Santarelli N. [facility] Pager [phone].  If 7PM-7AM, please contact night-coverage [email or web address] Password [email or web address] 01/11/2014, 1:41 AM

## 2014-01-11 NOTE — Progress Notes (Signed)
*  PRELIMINARY RESULTS* Vascular Ultrasound Carotid Duplex (Doppler) has been completed.  Preliminary findings: Bilateral:  1-39% ICA stenosis.  Vertebral artery flow is antegrade.   Homogenous plaque noted at right ICA origin, with no significant increase in velocity.   Landry Mellow, RDMS, RVT  01/11/2014, 12:58 PM

## 2014-01-11 NOTE — Progress Notes (Signed)
  Echocardiogram 2D Echocardiogram has been performed.  Mauricio Po 01/11/2014, 12:21 PM

## 2014-01-11 NOTE — Code Documentation (Signed)
Pt was being monitored on TIA alert when LUE/LLE drift & ataxia began at 2355.  DR. Camilo notified & pt taken for CTA Head, NS bolus given, new NIH 4.

## 2014-01-11 NOTE — Progress Notes (Signed)
Stroke Team Progress Note  HISTORY  Chief Complaint: CODE STROKE: LEFT HAND STIFFNESS/WEAKNESS.   HPI:  Margaret Vazquez is an 78 y.o. female with a past medical history significant for HTN, DM type II, hyperlipidemia, non obstructive CAD,multifocal infarcts 8/14 in the left frontal and parietal MCA territory with severe proximal left M1 stenosis without known embolic source, s/p loop recorder placement, brought in by EMS as a code stroke due to acute onset of the above stated symptoms.  She indicated that she had similar symptoms in the right arm at the time of her prior stroke.  [name] expressed that she was home, came out of the tub, when she developed acute onset of painful, uncontrollable, twisting, " drawing up" of her left middle finger and arm. She had total of 3 episodes with similar characteristics except for the absence of the pain on subsequent events. Had trouble using the hand at the time of the episode but in between there was not weakness, tingling, slurred speech, HA, language or vision impairment.  CT brain showed no acute intracranial abnormality and NIHSS 0.  Takes Ticlid, as she is intolerant of aspirin and plavix.  [name] was placed on Keppra at some time in the psat as she had further episodes after discharged form the hospital last year, but couldn't tolerate it an it was stopped.  Date last known well: 01/10/14  Time last known well: 2140  tPA Given: no, symptoms resolved  NIHSS: 0  Patient was not administered TPA secondary to symptoms resolved. She was admitted to the Internal Medicine Service for further evaluation and treatment. Neurology is consulting. Pt had left MCA multiple punctate infarcts last August presented with right middle finger drawing down. EEG neg and she was put on keppra low dose but not able to tolerate. She was also not able to tolerate ASA due to anaphylaxis and plavix due to weakness and lethargy. Currently she is on ticlid from [place] for  stroke prevention. Yesterday, pt had 3 episodes of left middle finger drawing up with left lower forearm pain and questionable weakness of left arm. Each episode last 11mn without other seizure like activity or LOC. Currently resolved. Admitted for work up.   SUBJECTIVE No acute events overnight. Family is at the bedside. The patient is alert and conversant, and states she is currently feeling back to normal.   OBJECTIVE Most recent Vital Signs: Filed Vitals:   01/11/14 0800 01/11/14 1005 01/11/14 1201 01/11/14 1439  BP: 131/52 138/53 163/76 153/58  Pulse: 60 60 57 57  Temp: 98.5 F (36.9 C) 98.4 F (36.9 C) 98.1 F (36.7 C) 98.2 F (36.8 C)  TempSrc: Oral Oral Oral Oral  Resp: 18 16 18 18  $ Height:      Weight:      SpO2: 96% 97% 96% 98%   CBG (last 3)   Recent Labs  01/10/14 2308 01/11/14 0237 01/11/14 1201  GLUCAP 104* 96 91    IV Fluid Intake:   . sodium chloride 50 mL/hr at 01/11/14 1022    MEDICATIONS  . enoxaparin (LOVENOX) injection  40 mg Subcutaneous Q24H  . insulin aspart  0-9 Units Subcutaneous TID WC  . ticlopidine  250 mg Oral BID WC   PRN:  diphenhydrAMINE, senna-docusate, tobramycin-dexamethasone  Diet:  Carb Control thin liquids Activity: As tolerated DVT Prophylaxis:  Lovenox  CLINICALLY SIGNIFICANT STUDIES Basic Metabolic Panel:  Recent Labs Lab 01/10/14 2305 01/11/14 1058  NA 142 140  K 4.1 4.1  CL 104 104  CO2 26 23  GLUCOSE 111* 110*  BUN 22 17  CREATININE 1.13* 0.86  CALCIUM 8.9 8.6   Liver Function Tests:  Recent Labs Lab 01/10/14 2305 01/11/14 1058  AST 15 15  ALT 15 13  ALKPHOS 95 91  BILITOT <0.2* 0.2*  PROT 7.1 6.7  ALBUMIN 3.6 3.3*   CBC:  Recent Labs Lab 01/10/14 2305 01/11/14 1058  WBC 8.1 5.1  NEUTROABS 3.8 2.3  HGB 12.7 12.2  HCT 38.1 36.4  MCV 95.0 94.3  PLT 208 202   Coagulation:  Recent Labs Lab 01/10/14 2305  LABPROT 13.6  INR 1.04   Cardiac Enzymes: No results found for this basename:  CKTOTAL, CKMB, CKMBINDEX, TROPONINI,  in the last 168 hours Urinalysis:  Recent Labs Lab 01/11/14 0109  COLORURINE YELLOW  LABSPEC 1.017  PHURINE 7.0  GLUCOSEU NEGATIVE  HGBUR NEGATIVE  BILIRUBINUR NEGATIVE  KETONESUR NEGATIVE  PROTEINUR NEGATIVE  UROBILINOGEN 0.2  NITRITE NEGATIVE  LEUKOCYTESUR NEGATIVE   Lipid Panel    Component Value Date/Time   CHOL 154 01/10/2014 2330   TRIG 101 01/10/2014 2330   HDL 72 01/10/2014 2330   CHOLHDL 2.1 01/10/2014 2330   VLDL 20 01/10/2014 2330   LDLCALC 62 01/10/2014 2330   HgbA1C  Lab Results  Component Value Date   HGBA1C 6.5* 01/10/2014    Urine Drug Screen:   No results found for this basename: labopia, cocainscrnur, labbenz, amphetmu, thcu, labbarb    Alcohol Level: No results found for this basename: ETH,  in the last 168 hours   Dg Chest 2 View  01/11/2014 IMPRESSION: No active cardiopulmonary disease.     Ct Head (brain) Wo Contrast  01/10/2014  IMPRESSION: No evidence of intracranial abnormality.  Pansinusitis.    Ct Angio Head W/cm &/or Wo Cm  01/11/2014 IMPRESSION: 1. No evidence of proximal branch occlusion or intraluminal thrombus within the intracranial circulation. 2. Grossly stable moderate to severe high-grade stenosis within the proximal left M1 segment. 3. No other acute intracranial abnormality. 4. Pansinusitis.     Mr Brain Wo Contrast  01/11/2014 IMPRESSION: No acute brain finding. Minor chronic appearing small vessel changes of the cerebral hemispheric white matter, less than often seen in healthy individuals of this age.  Inflammatory disease affecting the paranasal sinuses diffusely.      Carotid Doppler pending  2D Echocardiogram 01/11/14 EF 60-65%  EKG 01/10/14 Sinus rhythm. Abnormal R-wave progression, early transitionFor complete results please see formal report.   Therapy Recommendations OT recs Outpatient OT, 24 hour assist  Physical Exam Blood pressure 153/58, pulse 57, temperature 98.2 F (36.8 C), temperature  source Oral, resp. rate 18, height 5' 8.9" (1.75 m), weight 91.7 kg (202 lb 2.6 oz), SpO2 98.00%. Gen: Patient is well developed, well nourished elderly woman in no acute distress.  Cardiac: RRR. S1S2 audible.   Extremities: Cap refill <2 secs. No cyanosis or edema. Pulses 2+ radial and DP. Pulmonary: Respirations regular, symmetric. Lungs clear to auscultation bilat. Abd: Soft, non-tender. BS audible x 4 quadrants.  G/U: Deferred  MS: Alert, follows commands. Oriented to person, place, time, and event. Speech: Speech fluent and non-dysarthric. Able to name and repeat. No alexia or agraphia.   CN: No visual field cut. PERRL. EOMs intact. Facial sensation intact V1-3. No facial droop. Hearing grossly intact. Strong cough. Sternocleidomastoids and trapezius 5/5 strength. Tongue midline, full strength, no atrophy or fasciculations.  Strength: 5/5 in all four extremities proximally and distally.  Sensation: Intact  to light touch in all four extremities.  Coordination: No ataxia or dysmetria on FTN or HTS bilat. Gait steady.  Proprioception: Negative Romberg.   Reflexes: 2+ biceps, brachioradialis bilat. 2+ patellar, achilles bilat. No clonus. Downgoing toes bilat.  NIHSS 0  ASSESSMENT/PLAN Margaret Vazquez is a 78 y.o. female presenting with past medical history significant for HTN, DM type II, hyperlipidemia, non obstructive CAD, multifocal infarcts 8/14 in the left frontal and parietal MCA territory with severe proximal left M1 stenosis without known embolic source, s/p loop recorder placement who presents due to left hand stiffness and weakness. Her symptoms had resolved by the time she was evaluated in the ED, so IV tPA was not administered. Imaging shows no acute infarct, but minor chronic small vessel ischemic disease. On ticlopidine for antiplatelet therapy prior to admission due to adverse reaction to ASA and Plavix. Patient currently back to baseline.  ?TIA  MRI shows no new  infarct  2D Echo shows EF 60-65%  Carotid US pending  LOOP implanted during previous hospitalizationneg for dysrhythmia, functioning properly  LDL 62, meeting goal of LDL <70  HgbA1c 6.5, meeting goal of <6.5  Continue ticlopidine for antiplatelet  Lovenox for DVT prophylaxis  Educate patient about importance of lifestyle changes and risk factor management for secondary stroke prevention  BP has been 120s-180s in last 24 hours  EEG normal   Not sure whether the event is real TIA. As noted, her BP on admission was high and her Calcium is relatively low.   Dispo:   Plan is to discharge patient to home with 24 hour assistance  SIGNED Delbert Phenix, MSN, ANP-C, CNRN, [place] [facility] day # 1  I, the attending vascular neurologist, have personally obtained a history, examined the patient, evaluated laboratory data and imaging studies, and formulated the assessment and plan of care.  I have made any additions or clarifications directly to the above note and agree with the findings and plan as currently documented.   Rosalin Hawking, MD PhD 01/11/2014 7:04 PM   To contact Stroke Continuity provider, please refer to [email or web address] After hours, contact General Neurology

## 2014-01-11 NOTE — Progress Notes (Signed)
UR COMPLETED  

## 2014-01-12 DIAGNOSIS — E1151 Type 2 diabetes mellitus with diabetic peripheral angiopathy without gangrene: Secondary | ICD-10-CM

## 2014-01-12 DIAGNOSIS — I798 Other disorders of arteries, arterioles and capillaries in diseases classified elsewhere: Secondary | ICD-10-CM

## 2014-01-12 DIAGNOSIS — E1159 Type 2 diabetes mellitus with other circulatory complications: Secondary | ICD-10-CM

## 2014-01-12 LAB — GLUCOSE, CAPILLARY
Glucose-Capillary: 100 mg/dL — ABNORMAL HIGH (ref 70–99)
Glucose-Capillary: 92 mg/dL (ref 70–99)

## 2014-01-12 MED ORDER — IRBESARTAN 150 MG PO TABS
150.0000 mg | ORAL_TABLET | Freq: Every day | ORAL | Status: DC
Start: 2014-01-12 — End: 2014-01-12
  Administered 2014-01-12: 150 mg via ORAL
  Filled 2014-01-12: qty 1

## 2014-01-12 NOTE — Progress Notes (Signed)
Stroke Team Progress Note    SUBJECTIVE Patient sitting up at bedside. Husband present. Patient preparing to go home today.  OBJECTIVE Most recent Vital Signs: Filed Vitals:   01/12/14 0102 01/12/14 0529 01/12/14 1022 01/12/14 1355  BP: 162/72 128/72 155/65 146/52  Pulse: 54 60 55 62  Temp: 97.4 F (36.3 C) 98.2 F (36.8 C) 98 F (36.7 C) 98.4 F (36.9 C)  TempSrc: Oral Oral Oral Oral  Resp: 18 18 18 20   Height:      Weight:      SpO2: 97% 96% 96% 96%   CBG (last 3)   Recent Labs  01/11/14 2114 01/12/14 0636 01/12/14 1142  GLUCAP 144* 100* 92    IV Fluid Intake:      MEDICATIONS  . enoxaparin (LOVENOX) injection  40 mg Subcutaneous Q24H  . insulin aspart  0-9 Units Subcutaneous TID WC  . irbesartan  150 mg Oral Daily  . ticlopidine  250 mg Oral BID WC   PRN:  diphenhydrAMINE, senna-docusate, tobramycin-dexamethasone  Diet:  Carb Control thin liquids Activity: As tolerated DVT Prophylaxis:  Lovenox  CLINICALLY SIGNIFICANT STUDIES Basic Metabolic Panel:   Recent Labs Lab 01/10/14 2305 01/11/14 1058  NA 142 140  K 4.1 4.1  CL 104 104  CO2 26 23  GLUCOSE 111* 110*  BUN 22 17  CREATININE 1.13* 0.86  CALCIUM 8.9 8.6   Liver Function Tests:   Recent Labs Lab 01/10/14 2305 01/11/14 1058  AST 15 15  ALT 15 13  ALKPHOS 95 91  BILITOT <0.2* 0.2*  PROT 7.1 6.7  ALBUMIN 3.6 3.3*   CBC:   Recent Labs Lab 01/10/14 2305 01/11/14 1058  WBC 8.1 5.1  NEUTROABS 3.8 2.3  HGB 12.7 12.2  HCT 38.1 36.4  MCV 95.0 94.3  PLT 208 202   Coagulation:   Recent Labs Lab 01/10/14 2305  LABPROT 13.6  INR 1.04   Cardiac Enzymes: No results found for this basename: CKTOTAL, CKMB, CKMBINDEX, TROPONINI,  in the last 168 hours Urinalysis:   Recent Labs Lab 01/11/14 0109  COLORURINE YELLOW  LABSPEC 1.017  PHURINE 7.0  GLUCOSEU NEGATIVE  HGBUR NEGATIVE  BILIRUBINUR NEGATIVE  KETONESUR NEGATIVE  PROTEINUR NEGATIVE  UROBILINOGEN 0.2  NITRITE  NEGATIVE  LEUKOCYTESUR NEGATIVE   Lipid Panel    Component Value Date/Time   CHOL 154 01/10/2014 2330   TRIG 101 01/10/2014 2330   HDL 72 01/10/2014 2330   CHOLHDL 2.1 01/10/2014 2330   VLDL 20 01/10/2014 2330   LDLCALC 62 01/10/2014 2330   HgbA1C  Lab Results  Component Value Date   HGBA1C 6.5* 01/10/2014    Urine Drug Screen:   No results found for this basename: labopia,  cocainscrnur,  labbenz,  amphetmu,  thcu,  labbarb    Alcohol Level: No results found for this basename: ETH,  in the last 168 hours   Ct Head (brain) Wo Contrast 01/10/2014  IMPRESSION: No evidence of intracranial abnormality.  Pansinusitis.    Ct Angio Head W/cm &/or Wo Cm 01/11/2014 IMPRESSION: 1. No evidence of proximal branch occlusion or intraluminal thrombus within the intracranial circulation. 2. Grossly stable moderate to severe high-grade stenosis within the proximal left M1 segment. 3. No other acute intracranial abnormality. 4. Pansinusitis.     Mr Brain Wo Contrast 01/11/2014 IMPRESSION: No acute brain finding. Minor chronic appearing small vessel changes of the cerebral hemispheric white matter, less than often seen in healthy individuals of this age.  Inflammatory disease affecting the  paranasal sinuses diffusely.      Carotid Doppler Bilateral: 1-39% ICA stenosis. Vertebral artery flow is antegrade. Homogenous plaque noted at right ICA origin, with no significant increase in velocity.  2D Echocardiogram 01/11/14 EF 60-65%  Dg Chest 2 View 01/11/2014 IMPRESSION: No active cardiopulmonary disease.    EKG 01/10/14 Sinus rhythm. Abnormal R-wave progression, early transitionFor complete results please see formal report.   EEG normal, no seizure  Therapy Recommendations OT recs Outpatient OT, 24 hour assist  Physical Exam Blood pressure 146/52, pulse 62, temperature 98.4 F (36.9 C), temperature source Oral, resp. rate 20, height 5' 8.9" (1.75 m), weight 91.7 kg (202 lb 2.6 oz), SpO2 96.00%. Gen: Patient is well  developed, well nourished elderly woman in no acute distress.  Cardiac: RRR. S1S2 audible.   Extremities: Cap refill <2 secs. No cyanosis or edema. Pulses 2+ radial and DP. Pulmonary: Respirations regular, symmetric. Lungs clear to auscultation bilat. Abd: Soft, non-tender. BS audible x 4 quadrants.  G/U: Deferred  MS: Alert, follows commands. Oriented to person, place, time, and event. Speech: Speech fluent and non-dysarthric. Able to name and repeat. No alexia or agraphia.   CN: No visual field cut. PERRL. EOMs intact. Facial sensation intact V1-3. No facial droop. Hearing grossly intact. Strong cough. Sternocleidomastoids and trapezius 5/5 strength. Tongue midline, full strength, no atrophy or fasciculations.  Strength: 5/5 in all four extremities proximally and distally.  Sensation: Intact to light touch in all four extremities.  Coordination: No ataxia or dysmetria on FTN or HTS bilat. Gait steady.  Proprioception: Negative Romberg.   Reflexes: 2+ biceps, brachioradialis bilat. 2+ patellar, achilles bilat. No clonus. Downgoing toes bilat.  NIHSS 0  ASSESSMENT Margaret Vazquez is a 78 y.o. female presenting with past medical history significant for HTN, DM type II, hyperlipidemia, non obstructive CAD, multifocal infarcts 8/14 in the left frontal and parietal MCA territory with severe proximal left M1 stenosis without known embolic source, s/p loop recorder placement who presents due to left hand stiffness and weakness. Her symptoms had resolved by the time she was evaluated in the ED, so IV tPA was not administered. Imaging shows no acute infarct, but minor chronic small vessel ischemic disease. Dx:  Doubt TIA, likely symptomatic hypotension. On ticlopidine for antiplatelet therapy prior to admission due to adverse reaction to ASA and Plavix. Patient back to baseline without neuro symptoms.   LOOP implanted during previous hospitalization neg for dysrhythmia, functioning properly  LDL  62, at goal LDL < 70 for diabetics, off medications (unable to tolerate multiple statins in the past)  Diabetes, HgbA1c 6.5, meeting goal of <6.5  Hx previous stroke - 02/2013 left posterior frontal and parietal cortex and underlying white matter  R ICA plaque at the origin  PLAN D/c  home with husband Continue ticlid for secondary stroke prevention as unable to tolerate aspirin and plavix. OP OT recommended Avoid low BP or HTN - stay hydrated Follow up carotid dopplers in 6 months  No further stroke workup indicated. Ongoing risk factor control by Primary Care Physician - diabetes, BP, cholesterol Follow up with Dr. Leonie Man, [facility], in 2 months.  SIGNED Burnetta Sabin, MSN, RN, ANVP-BC, ANP-BC, [facility] Pager: [phone] 01/12/2014 2:20 PM   Hospital day # 2  I, the attending vascular neurologist, have personally obtained a history, examined the patient, evaluated laboratory data and imaging studies, and formulated the assessment and plan of care.  I have made any additions or clarifications directly  to the above note and agree with the findings and plan as currently documented.   Rosalin Hawking, MD PhD 01/12/2014 2:19 PM   To contact Stroke Continuity provider, please refer to [email or web address] After hours, contact General Neurology

## 2014-01-12 NOTE — Progress Notes (Signed)
Discharge information reviewed with patient. All questions answered at this time. All belonging sent with patient.   Ave Filter, RN

## 2014-01-12 NOTE — Discharge Summary (Addendum)
Physician Discharge Summary  Margaret Vazquez MGQ:[id number] DOB: 01-16-1936 DOA: 01/10/2014  PCP: Loura Pardon, MD  Admit date: 01/10/2014 Discharge date: 01/12/2014  Recommendations for Outpatient Follow-up:  1. Pt will need to follow up with PCP in 2 weeks post discharge 2. Please obtain BMP   Discharge Diagnoses:  Sensory disturbance/left arm weakness  -MRI brain--negative for acute or subacute stroke -CT angiogram of the brain was negative for possible branch occlusion or intraluminal thrombus within the intracranial circulation;   Moderate to severe stenosis within the proximal left M1 segment is  grossly similar relative to prior MRA -CT brain negative for acute abnormalities - 01/10/2014 hemoglobin A1c--6.5  -Carotid duplex negative for hemodynamically significant lesions--left 1-39%, right 40-59% -Echocardiogram EF 26-71%, grade 1 diastolic dysfunction, no source of emboli  -interrogation of loop recorder--neg for dysrhythmia, functioning properly  -01/11/2014 EEG negative for seizure activity  -continue Ticlid  -LDL 62  -Not clear if patient truly had TIA  -Case was discussed with the patient's neurologist/stroke team, Dr. Erlinda Hong,  whom cleared pt for d/c--recommended surveillance carotid duplex in 52months -MRI of the brain did reveal air-fluid levels in her sinuses, but the patient was asymptomatic without any headache, tenderness, purulent nasal discharge--may need outpt ENT followup as pt has had sinus surgery in past History of stroke -02/14/2013 MRI brain showed Multifocal areas of acute infarction affect the left posterior frontal and parietal cortex and underlying white matter.  Diabetes mellitus type 2  -Hemoglobin A1c 6.5  -CBGs controlled in hospital -NovoLog sliding scale while in hospital  -Discontinue glipizide--this can be restarted in the outpatient setting  Hypertension  -Restart ARB/HCTZ   Discharge Condition: Stable  Disposition:home      Follow-up  Information   Follow up with SETHI,PRAMOD, MD In 2 months.   Specialties:  Neurology, Radiology   Contact information:   [address] [place] [address] [phone]       Diet:heart healthy Wt Readings from Last 3 Encounters:  01/11/14 91.7 kg (202 lb 2.6 oz)  11/30/13 91.681 kg (202 lb 1.9 oz)  11/14/13 91.513 kg (201 lb 12 oz)    History of present illness:  78 y.o. female with history of hypertension diabetes mellitus, hyperlipidemia(intolerant to statins) was brought to the ER after patient had recurrent episodes of twisting movements of her left hand where in her hand was drawn in middle finger stretched out. These episodes happen at least thrice in the interval of 10 minutes at around 9:40 PM on the evening of admission. Patient did not have any headache nausea vomiting loss of consciousness palpitations chest pain or shortness of breath.  CT brain did not show anything acute. Neurologist on-call Dr. Aram Beecham was consulted. Patient was initially planned to be admitted for possible TIA but later around 11:55 PM on the night of admission, the patient started developing weakness of the left upper and lower extremities with ataxia. A Stat CT angiogram of the head and neck was done which did not show any acute occlusion. Patient has been admitted for further workup for CVA. Patient has had similar previous episode involving the right upper extremity in august 2014. Patient also was placed on loop recorder to check for any arrhythmias. Patient denies any weakness of the other extremities.   A complete stroke workup was undertaken including MRI the brain which was negative for acute or subacute infarct. Echocardiogram showed EF 60-65% with grade 1 diastolic dysfunction. Carotid ultrasound did not show any hemodynamically significant lesions. EEG  was performed and was negative for any seizure type activity. Hemoglobin A1c was 6.5. LDL was 62. Stroke continuity team continued to  follow the patient and recommended continuation of Ticlid and modification of risk factors. Ultimately, after the patient's workup was completed, the patient was discharged in stable condition. Notably, the patient did not have any further upper extremity abnormal movements throughout the hospitalization.     Consultants: neurology  Discharge Exam: Filed Vitals:   01/12/14 1022  BP: 155/65  Pulse: 55  Temp: 98 F (36.7 C)  Resp: 18   Filed Vitals:   01/11/14 2124 01/12/14 0102 01/12/14 0529 01/12/14 1022  BP: 155/69 162/72 128/72 155/65  Pulse: 57 54 60 55  Temp: 97.8 F (36.6 C) 97.4 F (36.3 C) 98.2 F (36.8 C) 98 F (36.7 C)  TempSrc: Oral Oral Oral Oral  Resp: 18 18 18 18   Height:      Weight:      SpO2: 96% 97% 96% 96%   General: A&O x 3, NAD, pleasant, cooperative Cardiovascular: RRR, no rub, no gallop, no S3 Respiratory: CTAB, no wheeze, no rhonchi Abdomen:soft, nontender, nondistended, positive bowel sounds Extremities: No edema, No lymphangitis, no petechiae  Discharge Instructions     Medication List         diphenhydrAMINE 25 MG tablet  Commonly known as:  BENADRYL  Take 50 mg by mouth every 8 (eight) hours as needed for allergies.     EPINEPHrine 0.3 mg/0.3 mL Devi  Commonly known as:  EPI-PEN  Inject 0.3 mg into the muscle daily as needed (allergic reaction).     glipiZIDE 5 MG 24 hr tablet  Commonly known as:  GLUCOTROL XL  Take 1 tablet (5 mg total) by mouth daily.     olmesartan-hydrochlorothiazide 20-12.5 MG per tablet  Commonly known as:  BENICAR HCT  Take 1 tablet by mouth daily.     ticlopidine 250 MG tablet  Commonly known as:  TICLID  Take 1 tablet (250 mg total) by mouth 2 (two) times daily with a meal.     tobramycin-dexamethasone ophthalmic solution  Commonly known as:  TOBRADEX  Place 1 drop into both eyes daily as needed (dryness).         The results of significant diagnostics from this hospitalization (including  imaging, microbiology, ancillary and laboratory) are listed below for reference.    Significant Diagnostic Studies: Ct Angio Head W/cm &/or Wo Cm  01/11/2014   CLINICAL DATA:  Code stroke. Left upper extremity symptoms. Similar to previous right-sided stroke 1 year ago.  EXAM: CT ANGIOGRAPHY HEAD  TECHNIQUE: Multidetector CT imaging of the head was performed using the standard protocol during bolus administration of intravenous contrast. Multiplanar CT image reconstructions and MIPs were obtained to evaluate the vascular anatomy.  CONTRAST:  38mL OMNIPAQUE IOHEXOL 350 MG/ML SOLN  COMPARISON:  Prior CT performed earlier on the same day as well as prior MRI from 04/25/2013.  FINDINGS: ANTERIOR CIRCULATION:  The visualized portions of the distal cervical segments of the internal carotid arteries are well opacified and widely patent. The petrous segments are within normal limits. Scattered calcified atheromatous disease seen within the cavernous segments of the internal carotid arteries bilaterally without associated hemodynamically significant stenosis. Supra clinoid segments well opacified bilaterally.  A1 segments, anterior communicating artery, and anterior cerebral arteries are normal.  Moderate to severe stenosis within the proximal left M1 segment is grossly similar relative to prior MRA (series 5010, image 53). M1 segment is well opacified  distally. Multi focal atherosclerotic irregularity seen within the distal left MCA branches.  Mild multi focal irregularity seen within the right M1 segment which is well opacified. No proximal branch occlusion. Right MCA bifurcation within normal limits. Distal right MCA branches are opacified.  No intracranial aneurysm identified appear  POSTERIOR CIRCULATION:  The left vertebral artery is dominant. The right vertebral artery appears to terminate and height via. Posterior inferior cerebral arteries are otherwise normal. Vertebrobasilar junction and basilar artery are  within normal limits. Multi focal atherosclerotic irregularity seen within the P2 segments bilaterally without hemodynamically significant stenosis. Superior cerebellar arteries grossly normal. Small posterior communicating arteries seen bilaterally.  No intracranial aneurysm identified.  Review of the MIP images confirms the above findings.  Pansinus disease again noted. No other acute intracranial abnormality. Atrophy and white matter disease is unchanged.  IMPRESSION: 1. No evidence of proximal branch occlusion or intraluminal thrombus within the intracranial circulation. 2. Grossly stable moderate to severe high-grade stenosis within the proximal left M1 segment. 3. No other acute intracranial abnormality. 4. Pansinusitis.   Electronically Signed   By: Jeannine Boga M.D.   On: 01/11/2014 01:15   Dg Chest 2 View  01/11/2014   CLINICAL DATA:  Stroke  EXAM: CHEST  2 VIEW  COMPARISON:  08/22/2013  FINDINGS: Heart size upper normal. Negative for heart failure. Loop recorder noted overlying the left chest.  Negative for pulmonary edema.  No infiltrate or effusion is present.  IMPRESSION: No active cardiopulmonary disease.   Electronically Signed   By: Franchot Gallo M.D.   On: 01/11/2014 08:19   Ct Head (brain) Wo Contrast  01/10/2014   CLINICAL DATA:  Acute stroke.  Left arm weakness.  EXAM: CT HEAD WITHOUT CONTRAST  TECHNIQUE: Contiguous axial images were obtained from the base of the skull through the vertex without intravenous contrast.  COMPARISON:  08/26/13  FINDINGS: No evidence of intracranial hemorrhage, brain edema, or other signs of acute infarction. No evidence of intracranial mass lesion or mass effect. No abnormal extraaxial fluid collections identified. Ventricles are normal in size. No skull abnormality identified.  Near complete opacification of the paranasal sinuses seen with new air-fluid levels seen in the left frontal sinus.  IMPRESSION: No evidence of intracranial abnormality.   Pansinusitis.  These results were called by telephone at the time of interpretation on 01/10/2014 at 11:46 PM to Dr. Armida Sans , who verbally acknowledged these results.   Electronically Signed   By: Earle Gell M.D.   On: 01/10/2014 23:47   Mr Brain Wo Contrast  01/11/2014   CLINICAL DATA:  Abnormal motions of the left hand.  Stroke.  EXAM: MRI HEAD WITHOUT CONTRAST  TECHNIQUE: Multiplanar, multiecho pulse sequences of the brain and surrounding structures were obtained without intravenous contrast.  COMPARISON:  CT angiography same day.  MRI 04/25/2013.  FINDINGS: Diffusion imaging does not show any acute or subacute infarction. The brainstem and cerebellum are normal. The cerebral hemispheres show mild chronic small-vessel ischemic changes within the deep white matter, less than often seen in healthy individuals of this age. No abnormality seen affecting the thalamus or basal ganglia. No mass lesion, hemorrhage, hydrocephalus or extra-axial collection. There is extensive inflammatory disease throughout the paranasal sinuses with all of the sinuses being opacified with the exception of the right division of the sphenoid sinus. No pituitary mass. No skull base lesion.  IMPRESSION: No acute brain finding. Minor chronic appearing small vessel changes of the cerebral hemispheric white matter, less than often seen  in healthy individuals of this age.  Inflammatory disease affecting the paranasal sinuses diffusely.   Electronically Signed   By: Nelson Chimes M.D.   On: 01/11/2014 15:10     Microbiology: No results found for this or any previous visit (from the past 240 hour(s)).   Labs: Basic Metabolic Panel:  Recent Labs Lab 01/10/14 2305 01/11/14 1058  NA 142 140  K 4.1 4.1  CL 104 104  CO2 26 23  GLUCOSE 111* 110*  BUN 22 17  CREATININE 1.13* 0.86  CALCIUM 8.9 8.6   Liver Function Tests:  Recent Labs Lab 01/10/14 2305 01/11/14 1058  AST 15 15  ALT 15 13  ALKPHOS 95 91  BILITOT <0.2* 0.2*   PROT 7.1 6.7  ALBUMIN 3.6 3.3*   No results found for this basename: LIPASE, AMYLASE,  in the last 168 hours No results found for this basename: AMMONIA,  in the last 168 hours CBC:  Recent Labs Lab 01/10/14 2305 01/11/14 1058  WBC 8.1 5.1  NEUTROABS 3.8 2.3  HGB 12.7 12.2  HCT 38.1 36.4  MCV 95.0 94.3  PLT 208 202   Cardiac Enzymes: No results found for this basename: CKTOTAL, CKMB, CKMBINDEX, TROPONINI,  in the last 168 hours BNP: No components found with this basename: POCBNP,  CBG:  Recent Labs Lab 01/11/14 1201 01/11/14 1623 01/11/14 2114 01/12/14 0636 01/12/14 1142  GLUCAP 91 120* 144* 100* 92    Time coordinating discharge:  Greater than 30 minutes  Signed:  Desma Wilkowski, DO [facility] Pager: [phone] 01/12/2014, 1:44 PM

## 2014-01-12 NOTE — Progress Notes (Signed)
Noted Occupational Therapy recommendation for Outpatient therapy; CM talked to pt/ spouse- patient does not want any therapy at this time; patient stated " I don't need that.' Mindi Slicker RN,BSN,MHA [phone]

## 2014-01-12 NOTE — Evaluation (Signed)
Speech Language Pathology Evaluation Patient Details Name: Margaret Vazquez MRN: [id number] DOB: Nov 05, 1935 Today's Date: 01/12/2014 Time: 2025-4270 SLP Time Calculation (min): 26 min  Problem List:  Patient Active Problem List   Diagnosis Date Noted  . TIA (transient ischemic attack) 01/11/2014  . CVA (cerebral infarction) 01/11/2014  . Sensory disturbance 01/11/2014  . Pain of right thumb 11/14/2013  . Left hip pain 11/14/2013  . Chest pain at rest 08/31/2013  . LOOP Recorder LINQ 06/07/2013  . Fatigue 06/07/2013  . Sinusitis, chronic 05/16/2013  . Acute ischemic stroke 02/15/2013  . Numbness and tingling of right arm 02/14/2013  . Encounter for Medicare annual wellness exam 10/27/2012  . Encopresis 12/15/2011  . Post-menopausal 10/28/2011  . PERSONAL HX COLONIC POLYPS 09/04/2009  . VITAMIN B12 DEFICIENCY 03/06/2009  . UNSPECIFIED ANEMIA 02/13/2009  . ALLERGIC RHINITIS 02/14/2008  . BACK PAIN, LUMBAR 11/03/2007  . HYPERLIPIDEMIA 11/24/2006  . HYPERTENSION, ESSENTIAL NOS 11/24/2006  . DIABETES MELLITUS, TYPE II 10/09/2006  . GERD 10/09/2006  . OVERACTIVE BLADDER 10/09/2006  . INCONTINENCE, URGE 10/09/2006  . SKIN CANCER, HX OF 10/09/2006   Past Medical History:  Past Medical History  Diagnosis Date  . Hypertension   . Diabetes mellitus     type II  . Hyperlipidemia     myalgias with Lipitor and Zetia  . GERD (gastroesophageal reflux disease)   . Skin cancer     hx of basal cell/ak's  . Colon polyps 2009  . Allergy history, drug     Aspirin  . Cervical stenosis of spine     With neck pain  . CAD (coronary artery disease) 04/08/11    non obst by cath  . Migraine   . TIA (transient ischemic attack)   . Stroke August 2014   Past Surgical History:  Past Surgical History  Procedure Laterality Date  . Appendectomy    . Tubal ligation      BTL  . Breast surgery  1992    breast biopsy  . Eye surgery  2005    tear duct surgery  . Nasal sinus surgery  01/2005  .  Cardiac catheterization  1993    LHC: normal coronaries.   . Stress cardiolite  11/1999    Normal/ negative  . Cystoscopy w/ decannulation  03/2000    Normal  . Abd [place]  07/2003    Negative  . Tear duct surgery  2005  . Colonoscopy  12/2007    Adenomatous colon polyps  . Cardiac catheterization  04/07/2011    non obst CAD (Dr Burt Knack)  . Tee without cardioversion N/A 02/16/2013    Procedure: TRANSESOPHAGEAL ECHOCARDIOGRAM (TEE);  Surgeon: Josue Hector, MD;  Location: [facility] ENDOSCOPY;  Service: Cardiovascular;  Laterality: N/A;  . Loop recorder implant     HPI:  78 yo female adm to [facility] with left hand discoordination/? spasm.  Pt MRI negative for acute event, showed inflammation impacting paranasal sinuses.  Pt for speech/language evaluation.  She resides with her spouse.     Assessment / Plan / Recommendation Clinical Impression  Pt presents with intact cognitive linguistic skills, she was oriented x4, able to follow multiple process commands and stated all home medications.  Pt was able to ask specifics regarding multiple step commands and still follow direction accurately without need for repetition. Pt admits to premorbid difficulties with remembering names of people - dating back for many years and reports spouse with some progressive memory difficulties.  SLP educated pt to memory  compensation strategies in regard to medication administration,etc.  SLP to sign off, thanks.      SLP Assessment  Patient does not need any further Speech Lanaguage Pathology Services    Follow Up Recommendations  None    Frequency and Duration        Pertinent Vitals/Pain Afebrile, decreased    SLP Evaluation Prior Functioning  Cognitive/Linguistic Baseline: Within functional limits Type of Home: House  Lives With: Spouse Available Help at Discharge: Family;Available 24 hours/day Education: worked at [employer] x27 years Vocation: Retired   Associate Professor  Arousal/Alertness: Awake/alert Orientation  Level: Oriented X4 Attention: Sustained Sustained Attention: Appears intact Memory: Appears intact Awareness: Appears intact Problem Solving: Appears intact Safety/Judgment: Appears intact    Comprehension  Auditory Comprehension Overall Auditory Comprehension: Appears within functional limits for tasks assessed Yes/No Questions: Not tested Commands: Within Functional Limits (mutliprocess commands) Conversation: Complex Visual Recognition/Discrimination Discrimination: Not tested Reading Comprehension Reading Status: Not tested (pt reports reading stroke handout with baseline reading ability noted)    Expression Expression Primary Mode of Expression: Verbal Verbal Expression Overall Verbal Expression: Appears within functional limits for tasks assessed Initiation: No impairment Level of Generative/Spontaneous Verbalization: Conversation Repetition: No impairment Naming: Not tested Pragmatics: No impairment Written Expression Dominant Hand: Right Written Expression: Not tested   Oral / Motor Oral Motor/Sensory Function Overall Oral Motor/Sensory Function: Appears within functional limits for tasks assessed Motor Speech Overall Motor Speech: Appears within functional limits for tasks assessed Respiration: Within functional limits Phonation: Normal Resonance: Within functional limits Articulation: Within functional limitis Intelligibility: Intelligible Motor Planning: Witnin functional limits   GO     Luanna Salk, [facility] SLP [phone]

## 2014-01-20 ENCOUNTER — Telehealth: Payer: Self-pay | Admitting: Family Medicine

## 2014-01-20 NOTE — Telephone Encounter (Signed)
Left vm requesting pt to call back to schedule hospital f/u.

## 2014-01-27 ENCOUNTER — Encounter: Payer: Self-pay | Admitting: Family Medicine

## 2014-01-27 ENCOUNTER — Ambulatory Visit (INDEPENDENT_AMBULATORY_CARE_PROVIDER_SITE_OTHER): Payer: Commercial Managed Care - HMO | Admitting: Family Medicine

## 2014-01-27 ENCOUNTER — Ambulatory Visit (INDEPENDENT_AMBULATORY_CARE_PROVIDER_SITE_OTHER): Payer: Commercial Managed Care - HMO | Admitting: *Deleted

## 2014-01-27 VITALS — BP 112/58 | HR 66 | Temp 98.1°F | Ht 69.0 in | Wt 202.5 lb

## 2014-01-27 DIAGNOSIS — G458 Other transient cerebral ischemic attacks and related syndromes: Secondary | ICD-10-CM

## 2014-01-27 DIAGNOSIS — I639 Cerebral infarction, unspecified: Secondary | ICD-10-CM

## 2014-01-27 DIAGNOSIS — J328 Other chronic sinusitis: Secondary | ICD-10-CM

## 2014-01-27 DIAGNOSIS — I6350 Cerebral infarction due to unspecified occlusion or stenosis of unspecified cerebral artery: Secondary | ICD-10-CM

## 2014-01-27 DIAGNOSIS — I10 Essential (primary) hypertension: Secondary | ICD-10-CM

## 2014-01-27 NOTE — Progress Notes (Signed)
Subjective:    Patient ID: Margaret Vazquez, female    DOB: 04-23-1936, 78 y.o.   MRN: [id number]  HPI Here for f/u hosp 7/7-7/9 for ? TIA  Had UE symptoms - weakness and numbness   MRI brain neg, CT angiogram showed a branch occlusion (stable) EEG neg Carotid doppler L 1-39% R 40-59% stenosis  Echocard DD Nl labs   Loop recorder - no events at all (has cardiol f/u)  DM  Lab Results  Component Value Date   HGBA1C 6.5* 01/10/2014   sugar was well controlled   Did have sinus fluid on MRI- went to her ENT- tx for sinus infection - will f/u   Told to continue ticlid (she tolerates it)  She has seen Dr Leonie Man in the past  Told to go back to the hospital if symtpoms re occur   She feels fine now -no fatigue or weakness or other sequelae   bp is stable today  No cp or palpitations or headaches or edema  No side effects to medicines  BP Readings from Last 3 Encounters:  01/27/14 112/58  01/12/14 146/52  11/30/13 110/70    bp at home vary from 119/62 up to 160/73  She has always had a labile bp that has caused many problems     Patient Active Problem List   Diagnosis Date Noted  . Type 2 diabetes mellitus with vascular disease 01/12/2014  . TIA (transient ischemic attack) 01/11/2014  . CVA (cerebral infarction) 01/11/2014  . Sensory disturbance 01/11/2014  . Pain of right thumb 11/14/2013  . Left hip pain 11/14/2013  . Chest pain at rest 08/31/2013  . LOOP Recorder LINQ 06/07/2013  . Fatigue 06/07/2013  . Sinusitis, chronic 05/16/2013  . Acute ischemic stroke 02/15/2013  . Numbness and tingling of right arm 02/14/2013  . Encounter for Medicare annual wellness exam 10/27/2012  . Encopresis 12/15/2011  . Post-menopausal 10/28/2011  . PERSONAL HX COLONIC POLYPS 09/04/2009  . VITAMIN B12 DEFICIENCY 03/06/2009  . UNSPECIFIED ANEMIA 02/13/2009  . ALLERGIC RHINITIS 02/14/2008  . BACK PAIN, LUMBAR 11/03/2007  . HYPERLIPIDEMIA 11/24/2006  . HYPERTENSION, ESSENTIAL NOS  11/24/2006  . DIABETES MELLITUS, TYPE II 10/09/2006  . GERD 10/09/2006  . OVERACTIVE BLADDER 10/09/2006  . INCONTINENCE, URGE 10/09/2006  . SKIN CANCER, HX OF 10/09/2006   Past Medical History  Diagnosis Date  . Hypertension   . Diabetes mellitus     type II  . Hyperlipidemia     myalgias with Lipitor and Zetia  . GERD (gastroesophageal reflux disease)   . Skin cancer     hx of basal cell/ak's  . Colon polyps 2009  . Allergy history, drug     Aspirin  . Cervical stenosis of spine     With neck pain  . CAD (coronary artery disease) 04/08/11    non obst by cath  . Migraine   . TIA (transient ischemic attack)   . Stroke August 2014   Past Surgical History  Procedure Laterality Date  . Appendectomy    . Tubal ligation      BTL  . Breast surgery  1992    breast biopsy  . Eye surgery  2005    tear duct surgery  . Nasal sinus surgery  01/2005  . Cardiac catheterization  1993    LHC: normal coronaries.   . Stress cardiolite  11/1999    Normal/ negative  . Cystoscopy w/ decannulation  03/2000    Normal  .  Abd [place]  07/2003    Negative  . Tear duct surgery  2005  . Colonoscopy  12/2007    Adenomatous colon polyps  . Cardiac catheterization  04/07/2011    non obst CAD (Dr Burt Knack)  . Tee without cardioversion N/A 02/16/2013    Procedure: TRANSESOPHAGEAL ECHOCARDIOGRAM (TEE);  Surgeon: Josue Hector, MD;  Location: [facility] ENDOSCOPY;  Service: Cardiovascular;  Laterality: N/A;  . Loop recorder implant     History  Substance Use Topics  . Smoking status: Never Smoker   . Smokeless tobacco: Never Used  . Alcohol Use: No   Family History  Problem Relation Age of Onset  . Lung cancer Brother   . Colon cancer Neg Hx   . Skin cancer Daughter   . Diabetes Sister   . Diabetes Brother   . Heart disease Mother   . Heart disease Father    Allergies  Allergen Reactions  . Bee Venom Hives, Shortness Of Breath and Swelling  . Nabumetone Anaphylaxis  . Amoxicillin-Pot  Clavulanate Hives and Swelling    To lips.  . Aspirin Hives  . Atorvastatin Swelling     joint pain/swelling, inc liver tests  . Clopidogrel Bisulfate Hives  . Codeine Nausea And Vomiting  . Ezetimibe Other (See Comments)     fatigue  . Metformin And Related Other (See Comments)    Diarrhea   . Valsartan Other (See Comments)     fatigue   Current Outpatient Prescriptions on File Prior to Visit  Medication Sig Dispense Refill  . diphenhydrAMINE (BENADRYL) 25 MG tablet Take 50 mg by mouth every 8 (eight) hours as needed for allergies.       Marland Kitchen EPINEPHrine (EPI-PEN) 0.3 mg/0.3 mL DEVI Inject 0.3 mg into the muscle daily as needed (allergic reaction).       Marland Kitchen glipiZIDE (GLUCOTROL XL) 5 MG 24 hr tablet Take 1 tablet (5 mg total) by mouth daily.  30 tablet  5  . olmesartan-hydrochlorothiazide (BENICAR HCT) 20-12.5 MG per tablet Take 1 tablet by mouth daily.  30 tablet  11  . ticlopidine (TICLID) 250 MG tablet Take 1 tablet (250 mg total) by mouth 2 (two) times daily with a meal.  60 tablet  4  . tobramycin-dexamethasone (TOBRADEX) ophthalmic solution Place 1 drop into both eyes daily as needed (dryness).       . [DISCONTINUED] levETIRAcetam (KEPPRA) 250 MG tablet Take 1 tablet (250 mg total) by mouth 2 (two) times daily.  60 tablet  0   No current facility-administered medications on file prior to visit.    Review of Systems Review of Systems  Constitutional: Negative for fever, appetite change, fatigue and unexpected weight change.  Eyes: Negative for pain and visual disturbance.  Respiratory: Negative for cough and shortness of breath.   Cardiovascular: Negative for cp or palpitations    Gastrointestinal: Negative for nausea, diarrhea and constipation.  Genitourinary: Negative for urgency and frequency.  Skin: Negative for pallor or rash   Neurological: Negative for weakness, light-headedness, numbness and headaches.  Hematological: Negative for adenopathy. Does not bruise/bleed  easily.  Psychiatric/Behavioral: Negative for dysphoric mood. The patient is not nervous/anxious.         Objective:   Physical Exam  Constitutional: She appears well-developed and well-nourished. No distress.  obese and well appearing   HENT:  Head: Normocephalic and atraumatic.  Mouth/Throat: Oropharynx is clear and moist.  Eyes: Conjunctivae and EOM are normal. Pupils are equal, round, and reactive to light. No  scleral icterus.  Neck: Normal range of motion. Neck supple. No JVD present. Carotid bruit is not present. No thyromegaly present.  Cardiovascular: Normal rate, regular rhythm, normal heart sounds and intact distal pulses.  Exam reveals no gallop.   Pulmonary/Chest: Effort normal and breath sounds normal. No respiratory distress. She has no wheezes. She has no rales.  Abdominal: Soft. Bowel sounds are normal. She exhibits no distension. There is no tenderness.  Musculoskeletal: She exhibits no edema.  Lymphadenopathy:    She has no cervical adenopathy.  Neurological: She is alert. She has normal reflexes. She displays no atrophy and no tremor. No cranial nerve deficit or sensory deficit. She exhibits normal muscle tone. Coordination and gait normal.  No cerebellar signs  No facial droop or speech problems   Skin: Skin is warm and dry. No rash noted. No erythema. No pallor.  Psychiatric: She has a normal mood and affect.          Assessment & Plan:

## 2014-01-27 NOTE — Patient Instructions (Addendum)
Blood pressure and glucose control are stable / good today  The range of blood pressures you have at home are fairly normal for you - but if it goes way up or down let me know  If you have any low sugar episodes - let me know Lab today  Eat healthy and take care of yourself

## 2014-01-27 NOTE — Progress Notes (Signed)
Pre visit review using our clinic review tool, if applicable. No additional management support is needed unless otherwise documented below in the visit note. 

## 2014-01-28 LAB — BASIC METABOLIC PANEL
BUN: 16 mg/dL (ref 6–23)
CO2: 28 mEq/L (ref 19–32)
Calcium: 8.6 mg/dL (ref 8.4–10.5)
Chloride: 100 mEq/L (ref 96–112)
Creat: 0.98 mg/dL (ref 0.50–1.10)
Glucose, Bld: 104 mg/dL — ABNORMAL HIGH (ref 70–99)
Potassium: 4.4 mEq/L (ref 3.5–5.3)
Sodium: 138 mEq/L (ref 135–145)

## 2014-01-29 NOTE — Assessment & Plan Note (Signed)
Seen on MRI She will continue f/u with ENT and finish abx (also recent prednisone)

## 2014-01-29 NOTE — Assessment & Plan Note (Signed)
?   If TIA at this last hosp visit - her studies do not indicate it  Rev hosp d/c info in length with pt  Loop recorder has not caught any arrhythmia so far  Pt is back to baseline  Following bp closely Neuro and cardio follow ups planned  Bmp today

## 2014-01-29 NOTE — Assessment & Plan Note (Signed)
bp in fair control at this time  BP Readings from Last 1 Encounters:  01/27/14 112/58   No changes needed Disc lifstyle change with low sodium diet and exercise   hosp studies and labs rev  bp at home are very labile

## 2014-02-01 NOTE — Progress Notes (Signed)
Loop recorder 

## 2014-02-21 ENCOUNTER — Ambulatory Visit: Payer: Medicare Other | Admitting: Neurology

## 2014-02-22 ENCOUNTER — Encounter: Payer: Self-pay | Admitting: Nurse Practitioner

## 2014-02-22 ENCOUNTER — Ambulatory Visit (INDEPENDENT_AMBULATORY_CARE_PROVIDER_SITE_OTHER): Payer: Commercial Managed Care - HMO | Admitting: Nurse Practitioner

## 2014-02-22 VITALS — BP 115/65 | HR 62 | Ht 69.0 in | Wt 204.0 lb

## 2014-02-22 DIAGNOSIS — R202 Paresthesia of skin: Principal | ICD-10-CM

## 2014-02-22 DIAGNOSIS — R209 Unspecified disturbances of skin sensation: Secondary | ICD-10-CM

## 2014-02-22 DIAGNOSIS — I6350 Cerebral infarction due to unspecified occlusion or stenosis of unspecified cerebral artery: Secondary | ICD-10-CM

## 2014-02-22 DIAGNOSIS — R200 Anesthesia of skin: Secondary | ICD-10-CM

## 2014-02-22 DIAGNOSIS — I639 Cerebral infarction, unspecified: Secondary | ICD-10-CM

## 2014-02-22 DIAGNOSIS — G40109 Localization-related (focal) (partial) symptomatic epilepsy and epileptic syndromes with simple partial seizures, not intractable, without status epilepticus: Secondary | ICD-10-CM

## 2014-02-22 NOTE — Progress Notes (Addendum)
PATIENT: Margaret Vazquez DOB: Jul 01, 1936  REASON FOR VISIT: routine follow up for stroke HISTORY FROM: patient  HISTORY OF PRESENT ILLNESS: 05/11/13 (PS): Ms Margaret Vazquez is a 89 year Caucasian lady who is seen for followup of recent hospital consults in August and October 2014. She was initially seen on 02/17/39 and when she was admitted for evaluation for multiple episodes of transient right arm numbness and tingling. Her symptoms got worse on 02/14/13 and she had difficulty eating with a fork as well as writing. MRI scan the brain showed multifocal infarcts in the left frontal and parietal MCA territory an MRI of the brain showed severe proximal left M1 stenosis. Transthoracic echocardiogram showed normal ejection fraction. Trans esophageal echocardiogram showed no cardiac source of embolism or PFO. Hemoglobin A1c was 6.5%. Total cholesterol was 172, triglycerides 161, HDL 41 and LDL 99 mg percent. She was started on aspirin and Plavix for stroke prevention and advised aggressive risk factor control. She also had a loop recorder implanted for atrial fibrillation detection. She subsequently returned on 04/25/39 and with transient episode of right upper extremity no weakness in addition to stiffening and twisting of her right arm and inability to control it. She was evaluated for possible sensory seizures with EEG, which was negative but she was started on low-dose Keppra 250 twice a day. She states she had trouble tolerating Keppra and developed itching as well as rash and was seen on the emergency room on 05/05/13 where Keppra was discontinued and she was switched to gabapentin. She always states that she is having trouble tolerating it and complains of sleepiness on it even though she is taking only 1 tablet daily. She had a CT head on 1019/14 which was stable. She complains of tiredness and decreased stamina. She's noticed that she still has some diminished fine motor skills in the right hand. She states her  blood pressure and sugars have been under good control. She remains on ticlopidine for secondary stroke prevention/she has an aspirin allergy and complained of feeling lethargic while taking Plavix in the past. She has seen me in the past for cervicogenic muscle tension headaches and tremors and was last in the office in 2013.   UPDATE 08/19/13 (LL): Ms. Margaret Vazquez returns for revisit, at last visit she discontinued Gabapentin and has no further episodes of her left arm "drawing up." She endorses severe fatigue, always feeling tired and sometimes feeling a pressure sensation between her shoulder blades which makes her feel like she needs to sit down. She has had no new neurovascular symptoms. She states her blood pressure and sugars have been under good control. She remains on ticlopidine for secondary stroke prevention.   UPDATE 02/22/14 (LL): Since last visit Ms. Margaret Vazquez was hospitalized in July for concern of stroke. She had 4 separate episodes of her left hand drawing up and decided to proceed to the emergency room. Episodes are similar to what she had in the right hand last year. MRI was negative for acute infarct. EEG was ordered in the hospital but not completed. She's had 2 similar episodes since leaving the hospital, on the left side also. Her blood pressure is been under good control it is 115/65 in the office today. She states that her cholesterol is under good control, and indeed it is below goal on no statin medication. She's tolerating Ticlid well without significant bleeding or bruising. She has no other complaints.  REVIEW OF SYSTEMS: Full 14 system review of systems performed and notable only  for: snoring, bruise easily  ALLERGIES: Allergies  Allergen Reactions  . Bee Venom Hives, Shortness Of Breath and Swelling  . Nabumetone Anaphylaxis  . Amoxicillin-Pot Clavulanate Hives and Swelling    To lips.  . Aspirin Hives  . Atorvastatin Swelling     joint pain/swelling, inc liver tests  .  Clopidogrel Bisulfate Hives  . Codeine Nausea And Vomiting  . Ezetimibe Other (See Comments)     fatigue  . Metformin And Related Other (See Comments)    Diarrhea   . Valsartan Other (See Comments)     fatigue    HOME MEDICATIONS: Outpatient Prescriptions Prior to Visit  Medication Sig Dispense Refill  . cefUROXime (CEFTIN) 250 MG tablet Take 1 tablet by mouth 2 (two) times daily.      . diphenhydrAMINE (BENADRYL) 25 MG tablet Take 50 mg by mouth every 8 (eight) hours as needed for allergies.       Marland Kitchen EPINEPHrine (EPI-PEN) 0.3 mg/0.3 mL DEVI Inject 0.3 mg into the muscle daily as needed (allergic reaction).       . fluticasone (FLONASE) 50 MCG/ACT nasal spray Place 2 sprays into both nostrils 2 (two) times daily.      Marland Kitchen glipiZIDE (GLUCOTROL XL) 5 MG 24 hr tablet Take 1 tablet (5 mg total) by mouth daily.  30 tablet  5  . olmesartan-hydrochlorothiazide (BENICAR HCT) 20-12.5 MG per tablet Take 1 tablet by mouth daily.  30 tablet  11  . ticlopidine (TICLID) 250 MG tablet Take 1 tablet (250 mg total) by mouth 2 (two) times daily with a meal.  60 tablet  4  . tobramycin-dexamethasone (TOBRADEX) ophthalmic solution Place 1 drop into both eyes daily as needed (dryness).        No facility-administered medications prior to visit.    PHYSICAL EXAM Filed Vitals:   02/22/14 1130  BP: 115/65  Pulse: 62  Height: 5\' 9"  (1.753 m)  Weight: 204 lb (92.534 kg)   Body mass index is 30.11 kg/(m^2).  Physical Exam  General: well developed, well nourished elderly Caucasian lady, seated, in no evident distress  Head: head normocephalic and atraumatic. Orohparynx benign  Neck: supple with no carotid or supraclavicular bruits  Cardiovascular: regular rate and rhythm, no murmurs  Musculoskeletal: no deformity  Skin: no rash/petichiae  Vascular: Normal pulses all extremities   Neurologic Exam  Mental Status: Awake and fully alert. Oriented to place and time. Recent and remote memory intact.  Attention span, concentration and fund of knowledge appropriate. Mood and affect appropriate.  Cranial Nerves: Fundoscopic exam not done. Pupils equal, briskly reactive to light. Extraocular movements full without nystagmus. Visual fields full to confrontation. Hearing intact. Facial sensation intact. Face, tongue, palate moves normally and symmetrically.  Motor: Normal bulk and tone. Normal strength in all tested extremity muscles. Diminished fine finger movements on the right. Orbits left-to-right upper extremity. Minimal weakness of right grip muscles only.  Sensory: intact to touch and pinprick and vibratory sensation. But has subjective paresthesias in the right hand  Coordination: Rapid alternating movements normal in all extremities. Finger-to-nose and heel-to-shin performed accurately bilaterally.  Gait and Station: Arises from chair without difficulty. Stance is normal. Gait demonstrates normal stride length and balance . Not able to heel, toe and tandem walk without difficulty.  Reflexes: 1+ and symmetric. Toes downgoing.   DIAGNOSTIC DATA (LABS, IMAGING, TESTING) - I reviewed patient records, labs, notes, testing and imaging myself where available.  Lab Results  Component Value Date   WBC 5.1  01/11/2014   HGB 12.2 01/11/2014   HCT 36.4 01/11/2014   MCV 94.3 01/11/2014   PLT 202 01/11/2014      Component Value Date/Time   NA 138 01/27/2014 1448   K 4.4 01/27/2014 1448   CL 100 01/27/2014 1448   CO2 28 01/27/2014 1448   GLUCOSE 104* 01/27/2014 1448   BUN 16 01/27/2014 1448   CREATININE 0.98 01/27/2014 1448   CREATININE 0.86 01/11/2014 1058   CALCIUM 8.6 01/27/2014 1448   PROT 6.7 01/11/2014 1058   ALBUMIN 3.3* 01/11/2014 1058   AST 15 01/11/2014 1058   ALT 13 01/11/2014 1058   ALKPHOS 91 01/11/2014 1058   BILITOT 0.2* 01/11/2014 1058   GFRNONAA 64* 01/11/2014 1058   GFRAA 74* 01/11/2014 1058   Lab Results  Component Value Date   CHOL 154 01/10/2014   HDL 72 01/10/2014   LDLCALC 62 01/10/2014   TRIG 101  01/10/2014   CHOLHDL 2.1 01/10/2014   Lab Results  Component Value Date   HGBA1C 6.5* 01/10/2014   Lab Results  Component Value Date   TSH 3.470 01/11/2014    ASSESSMENT: 34 year Caucasian lady with left middle cerebral artery branch infarcts in August 2014 secondary to severe proximal left M1 stenosis who has had recurrent episodes of right arm paresthesias with one episode in October 2014 with involuntary twisting and lack of control and shaking of the hand raising concern for simple partial seizure. She was started on Keppra but did not tolerate it well due to hives and itching and was changed to gabapentin which also she is having trouble tolerating due to sleepiness. Gabapentin was discontinued at last visit due to increased fatigue. She has begin having similar episodes of involuntary myoclonus in her left hand recently, evaluated at the hospital for stroke, which was negative.  PLAN:  I had a long discussion with the patient regarding her symptoms and answered questions.  Check a prolonged sleep-deprived EEG to evaluate again for focal seizure. Continue Ticlodipine for stroke prevention and maintain strict control of hypertension with blood pressure goal below 130/90, diabetes with hemoglobin A1c goal below 6.5% and lipids with LDL cholesterol goal below 100 mg/dL and return for followup in 6 months with Dr. Leonie Man or call earlier if necessary.  Orders Placed This Encounter  Procedures  . Adult EEG prolonged greater than 1 Hhour  . Adult sleep deprived EEG   ADDENDUM: Prolonged sleep-deprived EEG was normal.  LYNN E. LAM, MSN, FNP-BC, A/GNP-C 02/22/2014, 1:12 PM [facility] [address] [place], [place] [address] [phone]  Note: This document was prepared with digital dictation and possible smart phrase technology. Any transcriptional errors that result from this process are unintentional.

## 2014-02-22 NOTE — Patient Instructions (Addendum)
I am ordering a prolonged EEG for you in our office to try to see if you are having focal seizures.  Someone will call you to schedule this test.  It will be over 1 hour in length.  Continue Ticlodipine for stroke prevention and maintain strict control of hypertension with blood pressure goal below 130/90, diabetes with hemoglobin A1c goal below 6.5% and lipids with LDL cholesterol goal below 100 mg/dL and return for followup in 6 months with NP or call earlier if necessary.  Stroke Prevention Some medical conditions and behaviors are associated with an increased chance of having a stroke. You may prevent a stroke by making healthy choices and managing medical conditions. HOW CAN I REDUCE MY RISK OF HAVING A STROKE?   Stay physically active. Get at least 30 minutes of activity on most or all days.  Do not smoke. It may also be helpful to avoid exposure to secondhand smoke.  Limit alcohol use. Moderate alcohol use is considered to be:  No more than 2 drinks per day for men.  No more than 1 drink per day for nonpregnant women.  Eat healthy foods. This involves:  Eating 5 or more servings of fruits and vegetables a day.  Making dietary changes that address high blood pressure (hypertension), high cholesterol, diabetes, or obesity.  Manage your cholesterol levels.  Making food choices that are high in fiber and low in saturated fat, trans fat, and cholesterol may control cholesterol levels.  Take any prescribed medicines to control cholesterol as directed by your health care provider.  Manage your diabetes.  Controlling your carbohydrate and sugar intake is recommended to manage diabetes.  Take any prescribed medicines to control diabetes as directed by your health care provider.  Control your hypertension.  Making food choices that are low in salt (sodium), saturated fat, trans fat, and cholesterol is recommended to manage hypertension.  Take any prescribed medicines to control  hypertension as directed by your health care provider.  Maintain a healthy weight.  Reducing calorie intake and making food choices that are low in sodium, saturated fat, trans fat, and cholesterol are recommended to manage weight.  Stop drug abuse.  Avoid taking birth control pills.  Talk to your health care provider about the risks of taking birth control pills if you are over 23 years old, smoke, get migraines, or have ever had a blood clot.  Get evaluated for sleep disorders (sleep apnea).  Talk to your health care provider about getting a sleep evaluation if you snore a lot or have excessive sleepiness.  Take medicines only as directed by your health care provider.  For some people, aspirin or blood thinners (anticoagulants) are helpful in reducing the risk of forming abnormal blood clots that can lead to stroke. If you have the irregular heart rhythm of atrial fibrillation, you should be on a blood thinner unless there is a good reason you cannot take them.  Understand all your medicine instructions.  Make sure that other conditions (such as anemia or atherosclerosis) are addressed. SEEK IMMEDIATE MEDICAL CARE IF:   You have sudden weakness or numbness of the face, arm, or leg, especially on one side of the body.  Your face or eyelid droops to one side.  You have sudden confusion.  You have trouble speaking (aphasia) or understanding.  You have sudden trouble seeing in one or both eyes.  You have sudden trouble walking.  You have dizziness.  You have a loss of balance or coordination.  You have a sudden, severe headache with no known cause.  You have new chest pain or an irregular heartbeat. Any of these symptoms may represent a serious problem that is an emergency. Do not wait to see if the symptoms will go away. Get medical help at once. Call your local emergency services (911 in [place] Do not drive yourself to the hospital. Document Released: 07/31/2004 Document  Revised: 11/07/2013 Document Reviewed: 12/24/2012 [facility] Patient Information 2015 [place], [place]. This information is not intended to replace advice given to you by your health care provider. Make sure you discuss any questions you have with your health care provider.

## 2014-02-23 NOTE — Progress Notes (Signed)
I agree with the above plan 

## 2014-02-27 ENCOUNTER — Other Ambulatory Visit (INDEPENDENT_AMBULATORY_CARE_PROVIDER_SITE_OTHER): Payer: Commercial Managed Care - HMO | Admitting: Radiology

## 2014-02-27 ENCOUNTER — Ambulatory Visit (INDEPENDENT_AMBULATORY_CARE_PROVIDER_SITE_OTHER): Payer: Commercial Managed Care - HMO | Admitting: *Deleted

## 2014-02-27 DIAGNOSIS — G40109 Localization-related (focal) (partial) symptomatic epilepsy and epileptic syndromes with simple partial seizures, not intractable, without status epilepticus: Secondary | ICD-10-CM

## 2014-02-27 DIAGNOSIS — R200 Anesthesia of skin: Secondary | ICD-10-CM

## 2014-02-27 DIAGNOSIS — I6350 Cerebral infarction due to unspecified occlusion or stenosis of unspecified cerebral artery: Secondary | ICD-10-CM | POA: Diagnosis not present

## 2014-02-27 DIAGNOSIS — I639 Cerebral infarction, unspecified: Secondary | ICD-10-CM

## 2014-02-27 DIAGNOSIS — R202 Paresthesia of skin: Principal | ICD-10-CM

## 2014-02-27 LAB — MDC_IDC_ENUM_SESS_TYPE_REMOTE

## 2014-03-02 NOTE — Progress Notes (Signed)
Loop recorder 

## 2014-03-06 LAB — MDC_IDC_ENUM_SESS_TYPE_REMOTE

## 2014-03-13 LAB — MDC_IDC_ENUM_SESS_TYPE_REMOTE

## 2014-03-16 ENCOUNTER — Encounter: Payer: Self-pay | Admitting: Internal Medicine

## 2014-03-16 ENCOUNTER — Ambulatory Visit (INDEPENDENT_AMBULATORY_CARE_PROVIDER_SITE_OTHER): Payer: Commercial Managed Care - HMO | Admitting: Internal Medicine

## 2014-03-16 VITALS — BP 156/70 | HR 58 | Ht 69.0 in | Wt 202.0 lb

## 2014-03-16 DIAGNOSIS — Z959 Presence of cardiac and vascular implant and graft, unspecified: Secondary | ICD-10-CM

## 2014-03-16 DIAGNOSIS — I6350 Cerebral infarction due to unspecified occlusion or stenosis of unspecified cerebral artery: Secondary | ICD-10-CM

## 2014-03-16 DIAGNOSIS — I639 Cerebral infarction, unspecified: Secondary | ICD-10-CM

## 2014-03-16 LAB — MDC_IDC_ENUM_SESS_TYPE_INCLINIC
Date Time Interrogation Session: 20150910104900
Zone Setting Detection Interval: 2000 ms
Zone Setting Detection Interval: 3000 ms
Zone Setting Detection Interval: 390 ms

## 2014-03-16 NOTE — Progress Notes (Signed)
[email or web address]      Patient Care Team: Abner Greenspan, MD as PCP - General   HPI  Margaret Vazquez is a 78 y.o. female Seen in followup for a loop recorder implanted for cryptogenic stroke. She has significant hypertension which is much improved  She developed hypotension and her carvedilol was discontinued. She notes that at home her blood pressure varies from the 962I systolic--90s. The latter is associated with symptoms of fatigue and weakness.  She continues to have symptoms of groin of her right hand. She is seen by Dr. Leonie Man  for this. She is not her followup for reevaluation. I've encouraged her to call his office.     Past Medical History  Diagnosis Date  . Hypertension   . Diabetes mellitus     type II  . Hyperlipidemia     myalgias with Lipitor and Zetia  . GERD (gastroesophageal reflux disease)   . Skin cancer     hx of basal cell/ak's  . Colon polyps 2009  . Allergy history, drug     Aspirin  . Cervical stenosis of spine     With neck pain  . CAD (coronary artery disease) 04/08/11    non obst by cath  . Migraine   . TIA (transient ischemic attack)   . Stroke August 2014    Past Surgical History  Procedure Laterality Date  . Appendectomy    . Tubal ligation      BTL  . Breast surgery  1992    breast biopsy  . Eye surgery  2005    tear duct surgery  . Nasal sinus surgery  01/2005  . Cardiac catheterization  1993    LHC: normal coronaries.   . Stress cardiolite  11/1999    Normal/ negative  . Cystoscopy w/ decannulation  03/2000    Normal  . Abd [place]  07/2003    Negative  . Tear duct surgery  2005  . Colonoscopy  12/2007    Adenomatous colon polyps  . Cardiac catheterization  04/07/2011    non obst CAD (Dr Burt Knack)  . Tee without cardioversion N/A 02/16/2013    Procedure: TRANSESOPHAGEAL ECHOCARDIOGRAM (TEE);  Surgeon: Josue Hector, MD;  Location: [facility] ENDOSCOPY;  Service: Cardiovascular;  Laterality: N/A;  . Loop recorder implant      Current Outpatient  Prescriptions  Medication Sig Dispense Refill  . diphenhydrAMINE (BENADRYL) 25 MG tablet Take 50 mg by mouth every 8 (eight) hours as needed for allergies.       Marland Kitchen EPINEPHrine (EPI-PEN) 0.3 mg/0.3 mL DEVI Inject 0.3 mg into the muscle daily as needed (allergic reaction).       . fluticasone (FLONASE) 50 MCG/ACT nasal spray Place 2 sprays into both nostrils 2 (two) times daily.      Marland Kitchen glipiZIDE (GLUCOTROL XL) 5 MG 24 hr tablet Take 1 tablet (5 mg total) by mouth daily.  30 tablet  5  . olmesartan-hydrochlorothiazide (BENICAR HCT) 20-12.5 MG per tablet Take 1 tablet by mouth daily.  30 tablet  11  . ticlopidine (TICLID) 250 MG tablet Take 1 tablet (250 mg total) by mouth 2 (two) times daily with a meal.  60 tablet  4  . tobramycin-dexamethasone (TOBRADEX) ophthalmic solution Place 1 drop into both eyes daily as needed (dryness).       . [DISCONTINUED] levETIRAcetam (KEPPRA) 250 MG tablet Take 1 tablet (250 mg total) by mouth 2 (two) times daily.  60 tablet  0   No  current facility-administered medications for this visit.    Allergies  Allergen Reactions  . Bee Venom Hives, Shortness Of Breath and Swelling  . Nabumetone Anaphylaxis  . Amoxicillin-Pot Clavulanate Hives and Swelling    To lips.  . Aspirin Hives  . Atorvastatin Swelling     joint pain/swelling, inc liver tests  . Clopidogrel Bisulfate Hives  . Codeine Nausea And Vomiting  . Ezetimibe Other (See Comments)     fatigue  . Metformin And Related Other (See Comments)    Diarrhea   . Valsartan Other (See Comments)     fatigue    Review of Systems negative except from HPI and PMH  Physical Exam BP 156/70  Pulse 58  Ht 5\' 9"  (1.753 m)  Wt 202 lb (91.627 kg)  BMI 29.82 kg/m2 Well developed and well nourished in no acute distress HENT normal E scleral and icterus clear Neck Supple JVP flat; carotids brisk and full Clear to ausculation  Device pocket well healed; without hematoma or erythema.  There is no tethering    Regular rate and rhythm, no murmurs gallops or rub Soft with active bowel sounds No clubbing cyanosis no Edema Alert and oriented, grossly normal motor and sensory function Skin Warm and Dry    Assessment and  Plan  Cryptogenic stroke  Implantable loop recorder  Labile blood pressure  I suggested that she use an abdominal binder if her systolic blood pressures less than 100. This will obviate the need for other pharmacological therapy which could include ProAmatine.  There is no evidence of atrial fibrillation.  We'll continue to follow her loop recorder remotely and see her in one year

## 2014-03-16 NOTE — Patient Instructions (Addendum)
Your physician wants you to follow-up in [place]. You will receive a reminder letter in the mail two months in advance. If you don't receive a letter, please call our office to schedule the follow-up appointment.  Your physician recommends you use an abdominal binder (this can be purchased at [facility]) for blood pressures under 100.

## 2014-03-17 LAB — MDC_IDC_ENUM_SESS_TYPE_REMOTE

## 2014-03-20 ENCOUNTER — Encounter: Payer: Self-pay | Admitting: Internal Medicine

## 2014-03-28 ENCOUNTER — Ambulatory Visit (INDEPENDENT_AMBULATORY_CARE_PROVIDER_SITE_OTHER): Payer: Commercial Managed Care - HMO | Admitting: *Deleted

## 2014-03-28 DIAGNOSIS — I6350 Cerebral infarction due to unspecified occlusion or stenosis of unspecified cerebral artery: Secondary | ICD-10-CM

## 2014-03-28 DIAGNOSIS — I639 Cerebral infarction, unspecified: Secondary | ICD-10-CM

## 2014-04-04 LAB — MDC_IDC_ENUM_SESS_TYPE_REMOTE

## 2014-04-14 NOTE — Progress Notes (Signed)
Loop recorder 

## 2014-04-24 ENCOUNTER — Encounter: Payer: Self-pay | Admitting: Internal Medicine

## 2014-04-25 ENCOUNTER — Encounter: Payer: Self-pay | Admitting: Internal Medicine

## 2014-04-27 ENCOUNTER — Ambulatory Visit (INDEPENDENT_AMBULATORY_CARE_PROVIDER_SITE_OTHER): Payer: Commercial Managed Care - HMO | Admitting: *Deleted

## 2014-04-27 DIAGNOSIS — I6350 Cerebral infarction due to unspecified occlusion or stenosis of unspecified cerebral artery: Secondary | ICD-10-CM

## 2014-04-27 DIAGNOSIS — I639 Cerebral infarction, unspecified: Secondary | ICD-10-CM

## 2014-05-05 NOTE — Progress Notes (Signed)
Loop recorder 

## 2014-05-09 ENCOUNTER — Other Ambulatory Visit: Payer: Self-pay | Admitting: Family Medicine

## 2014-05-11 LAB — MDC_IDC_ENUM_SESS_TYPE_REMOTE

## 2014-05-16 ENCOUNTER — Encounter: Payer: Self-pay | Admitting: Internal Medicine

## 2014-05-26 ENCOUNTER — Ambulatory Visit (INDEPENDENT_AMBULATORY_CARE_PROVIDER_SITE_OTHER): Payer: Commercial Managed Care - HMO | Admitting: *Deleted

## 2014-05-26 DIAGNOSIS — I6350 Cerebral infarction due to unspecified occlusion or stenosis of unspecified cerebral artery: Secondary | ICD-10-CM

## 2014-05-26 DIAGNOSIS — I639 Cerebral infarction, unspecified: Secondary | ICD-10-CM

## 2014-05-30 ENCOUNTER — Telehealth: Payer: Self-pay | Admitting: Family Medicine

## 2014-05-30 NOTE — Progress Notes (Signed)
Loop recorder 

## 2014-05-30 NOTE — Telephone Encounter (Signed)
Pt called to see when you would like to see her back in the office for a follow up. Last visit 01/27/14

## 2014-05-30 NOTE — Telephone Encounter (Signed)
Please have her f/u early Feb with labs prior  thanks

## 2014-05-30 NOTE — Telephone Encounter (Signed)
Scheduled 08/21/2013

## 2014-06-15 ENCOUNTER — Encounter (HOSPITAL_COMMUNITY): Payer: Self-pay | Admitting: Internal Medicine

## 2014-06-15 LAB — MDC_IDC_ENUM_SESS_TYPE_REMOTE

## 2014-06-23 ENCOUNTER — Encounter (HOSPITAL_COMMUNITY): Payer: Self-pay | Admitting: *Deleted

## 2014-06-23 ENCOUNTER — Emergency Department (HOSPITAL_COMMUNITY): Payer: Commercial Managed Care - HMO

## 2014-06-23 ENCOUNTER — Emergency Department (HOSPITAL_COMMUNITY)
Admission: EM | Admit: 2014-06-23 | Discharge: 2014-06-24 | Disposition: A | Discharge status: Home / Self Care | Payer: Commercial Managed Care - HMO | Attending: Emergency Medicine | Admitting: Emergency Medicine

## 2014-06-23 DIAGNOSIS — Z9089 Acquired absence of other organs: Secondary | ICD-10-CM | POA: Insufficient documentation

## 2014-06-23 DIAGNOSIS — Z8719 Personal history of other diseases of the digestive system: Secondary | ICD-10-CM | POA: Insufficient documentation

## 2014-06-23 DIAGNOSIS — Z8639 Personal history of other endocrine, nutritional and metabolic disease: Secondary | ICD-10-CM | POA: Insufficient documentation

## 2014-06-23 DIAGNOSIS — Z85828 Personal history of other malignant neoplasm of skin: Secondary | ICD-10-CM | POA: Insufficient documentation

## 2014-06-23 DIAGNOSIS — Z7902 Long term (current) use of antithrombotics/antiplatelets: Secondary | ICD-10-CM | POA: Insufficient documentation

## 2014-06-23 DIAGNOSIS — Z7951 Long term (current) use of inhaled steroids: Secondary | ICD-10-CM | POA: Insufficient documentation

## 2014-06-23 DIAGNOSIS — R109 Unspecified abdominal pain: Secondary | ICD-10-CM | POA: Diagnosis present

## 2014-06-23 DIAGNOSIS — I2510 Atherosclerotic heart disease of native coronary artery without angina pectoris: Secondary | ICD-10-CM | POA: Diagnosis not present

## 2014-06-23 DIAGNOSIS — Z9851 Tubal ligation status: Secondary | ICD-10-CM | POA: Insufficient documentation

## 2014-06-23 DIAGNOSIS — I10 Essential (primary) hypertension: Secondary | ICD-10-CM | POA: Insufficient documentation

## 2014-06-23 DIAGNOSIS — Z880 Allergy status to penicillin: Secondary | ICD-10-CM | POA: Insufficient documentation

## 2014-06-23 DIAGNOSIS — Z9889 Other specified postprocedural states: Secondary | ICD-10-CM | POA: Diagnosis not present

## 2014-06-23 DIAGNOSIS — G43909 Migraine, unspecified, not intractable, without status migrainosus: Secondary | ICD-10-CM | POA: Insufficient documentation

## 2014-06-23 DIAGNOSIS — R1011 Right upper quadrant pain: Secondary | ICD-10-CM | POA: Diagnosis not present

## 2014-06-23 DIAGNOSIS — Z86010 Personal history of colonic polyps: Secondary | ICD-10-CM | POA: Diagnosis not present

## 2014-06-23 DIAGNOSIS — E119 Type 2 diabetes mellitus without complications: Secondary | ICD-10-CM | POA: Diagnosis not present

## 2014-06-23 DIAGNOSIS — Z79899 Other long term (current) drug therapy: Secondary | ICD-10-CM | POA: Diagnosis not present

## 2014-06-23 DIAGNOSIS — Z8673 Personal history of transient ischemic attack (TIA), and cerebral infarction without residual deficits: Secondary | ICD-10-CM | POA: Diagnosis not present

## 2014-06-23 LAB — URINALYSIS, ROUTINE W REFLEX MICROSCOPIC
Bilirubin Urine: NEGATIVE
Glucose, UA: NEGATIVE mg/dL
Ketones, ur: NEGATIVE mg/dL
Leukocytes, UA: NEGATIVE
Nitrite: NEGATIVE
Protein, ur: NEGATIVE mg/dL
Specific Gravity, Urine: 1.022 (ref 1.005–1.030)
Urobilinogen, UA: 1 mg/dL (ref 0.0–1.0)
pH: 6 (ref 5.0–8.0)

## 2014-06-23 LAB — COMPREHENSIVE METABOLIC PANEL
ALT: 15 U/L (ref 0–35)
AST: 16 U/L (ref 0–37)
Albumin: 3.8 g/dL (ref 3.5–5.2)
Alkaline Phosphatase: 91 U/L (ref 39–117)
Anion gap: 12 (ref 5–15)
BUN: 16 mg/dL (ref 6–23)
CO2: 27 mEq/L (ref 19–32)
Calcium: 9.2 mg/dL (ref 8.4–10.5)
Chloride: 100 mEq/L (ref 96–112)
Creatinine, Ser: 0.96 mg/dL (ref 0.50–1.10)
GFR calc Af Amer: 65 mL/min — ABNORMAL LOW (ref >90)
GFR calc non Af Amer: 56 mL/min — ABNORMAL LOW (ref >90)
Glucose, Bld: 91 mg/dL (ref 70–99)
Potassium: 4.1 mEq/L (ref 3.7–5.3)
Sodium: 139 mEq/L (ref 137–147)
Total Bilirubin: <0.2 mg/dL — ABNORMAL LOW (ref 0.3–1.2)
Total Protein: 7.4 g/dL (ref 6.0–8.3)

## 2014-06-23 LAB — URINE MICROSCOPIC-ADD ON

## 2014-06-23 LAB — CBG MONITORING, ED
Glucose-Capillary: 68 mg/dL — ABNORMAL LOW (ref 70–99)
Glucose-Capillary: 110 mg/dL — ABNORMAL HIGH (ref 70–99)

## 2014-06-23 LAB — CBC WITH DIFFERENTIAL/PLATELET
Basophils Absolute: 0 10*3/uL (ref 0.0–0.1)
Basophils Relative: 1 % (ref 0–1)
Eosinophils Absolute: 0.1 10*3/uL (ref 0.0–0.7)
Eosinophils Relative: 2 % (ref 0–5)
HCT: 39.2 % (ref 36.0–46.0)
Hemoglobin: 13.1 g/dL (ref 12.0–15.0)
Lymphocytes Relative: 38 % (ref 12–46)
Lymphs Abs: 2.6 10*3/uL (ref 0.7–4.0)
MCH: 31.3 pg (ref 26.0–34.0)
MCHC: 33.4 g/dL (ref 30.0–36.0)
MCV: 93.6 fL (ref 78.0–100.0)
Monocytes Absolute: 0.5 10*3/uL (ref 0.1–1.0)
Monocytes Relative: 7 % (ref 3–12)
Neutro Abs: 3.6 10*3/uL (ref 1.7–7.7)
Neutrophils Relative %: 52 % (ref 43–77)
Platelets: 224 10*3/uL (ref 150–400)
RBC: 4.19 MIL/uL (ref 3.87–5.11)
RDW: 12.7 % (ref 11.5–15.5)
WBC: 6.9 10*3/uL (ref 4.0–10.5)

## 2014-06-23 LAB — LIPASE, BLOOD: Lipase: 22 U/L (ref 11–59)

## 2014-06-23 MED ORDER — SODIUM CHLORIDE 0.9 % IV SOLN
INTRAVENOUS | Status: DC
  Administered 2014-06-23: 21:00:00 via INTRAVENOUS
  Administered 2014-06-23: 125 mL/h via INTRAVENOUS

## 2014-06-23 MED ORDER — IOHEXOL 300 MG/ML  SOLN
100.0000 mL | Freq: Once | INTRAMUSCULAR | Status: AC | PRN
  Administered 2014-06-23: 100 mL via INTRAVENOUS

## 2014-06-23 MED ORDER — MORPHINE SULFATE 4 MG/ML IJ SOLN
4.0000 mg | Freq: Once | INTRAMUSCULAR | Status: AC
  Administered 2014-06-23: 4 mg via INTRAVENOUS
  Filled 2014-06-23: qty 1

## 2014-06-23 MED ORDER — ONDANSETRON HCL 4 MG/2ML IJ SOLN
4.0000 mg | Freq: Once | INTRAMUSCULAR | Status: AC
  Administered 2014-06-23: 4 mg via INTRAVENOUS
  Filled 2014-06-23: qty 2

## 2014-06-23 MED ORDER — IOHEXOL 300 MG/ML  SOLN
25.0000 mL | INTRAMUSCULAR | Status: AC
  Administered 2014-06-23: 25 mL via ORAL

## 2014-06-23 NOTE — ED Notes (Signed)
MD at the bedside  

## 2014-06-23 NOTE — ED Notes (Signed)
Patient transported to Ultrasound 

## 2014-06-23 NOTE — ED Notes (Signed)
Patient returned from US.

## 2014-06-23 NOTE — ED Notes (Signed)
Per MD order, patient given apple juice and placed back on the monitor.

## 2014-06-23 NOTE — ED Notes (Signed)
The pt is c/o severe lower rt abd since this am.  No n v or diarrhea

## 2014-06-23 NOTE — ED Notes (Signed)
Patient ambulated to restroom with tech assistance.

## 2014-06-23 NOTE — ED Provider Notes (Signed)
CSN: [id number]     Arrival date & time 06/23/14  1851 History   First MD Initiated Contact with Patient 06/23/14 1957     Chief Complaint  Patient presents with  . Abdominal Pain     (Consider location/radiation/quality/duration/timing/severity/associated sxs/prior Treatment) HPI  The patient developed right mid abdominal pain midday. She reports that she had breakfast of some cereal and then went on to have a biscuit and started developing this intense pain in her right upper to mid abdomen. She reports is coming and recurrent spasms of pain. She denies any radiation. There's been no associated vomiting. She does feel nauseated there's been no diarrhea. She denies history of similar. She has had a distant appendectomy in her gallbladder is still present.  Past Medical History  Diagnosis Date  . Hypertension   . Diabetes mellitus     type II  . Hyperlipidemia     myalgias with Lipitor and Zetia  . GERD (gastroesophageal reflux disease)   . Skin cancer     hx of basal cell/ak's  . Colon polyps 2009  . Allergy history, drug     Aspirin  . Cervical stenosis of spine     With neck pain  . CAD (coronary artery disease) 04/08/11    non obst by cath  . Migraine   . TIA (transient ischemic attack)   . Stroke August 2014   Past Surgical History  Procedure Laterality Date  . Appendectomy    . Tubal ligation      BTL  . Breast surgery  1992    breast biopsy  . Eye surgery  2005    tear duct surgery  . Nasal sinus surgery  01/2005  . Cardiac catheterization  1993    LHC: normal coronaries.   . Stress cardiolite  11/1999    Normal/ negative  . Cystoscopy w/ decannulation  03/2000    Normal  . Abd [place]  07/2003    Negative  . Tear duct surgery  2005  . Colonoscopy  12/2007    Adenomatous colon polyps  . Cardiac catheterization  04/07/2011    non obst CAD (Dr Burt Knack)  . Tee without cardioversion N/A 02/16/2013    Procedure: TRANSESOPHAGEAL ECHOCARDIOGRAM (TEE);  Surgeon: Josue Hector, MD;  Location: [facility] ENDOSCOPY;  Service: Cardiovascular;  Laterality: N/A;  . Loop recorder implant    . Loop recorder implant N/A 02/16/2013    Procedure: LOOP RECORDER IMPLANT;  Surgeon: Deboraha Sprang, MD;  Location: [facility] CATH LAB;  Service: Cardiovascular;  Laterality: N/A;   Family History  Problem Relation Age of Onset  . Lung cancer Brother   . Colon cancer Neg Hx   . Skin cancer Daughter   . Diabetes Sister   . Diabetes Brother   . Heart disease Mother   . Heart disease Father    History  Substance Use Topics  . Smoking status: Never Smoker   . Smokeless tobacco: Never Used  . Alcohol Use: No   OB History    No data available     Review of Systems  10 Systems reviewed and are negative for acute change except as noted in the HPI.   Allergies  Bee venom; Nabumetone; Amoxicillin-pot clavulanate; Aspirin; Atorvastatin; Clopidogrel bisulfate; Codeine; Ezetimibe; Metformin and related; and Valsartan  Home Medications   Prior to Admission medications   Medication Sig Start Date End Date Taking? Authorizing Provider  CINNAMON PO Take 1 tablet by mouth daily.   Yes  Historical Provider, MD  EPINEPHrine (EPI-PEN) 0.3 mg/0.3 mL DEVI Inject 0.3 mg into the muscle daily as needed (allergic reaction).  12/20/10  Yes Abner Greenspan, MD  fluticasone (FLONASE) 50 MCG/ACT nasal spray Place 2 sprays into both nostrils 2 (two) times daily. 01/20/14  Yes Historical Provider, MD  GLIPIZIDE XL 5 MG 24 hr tablet TAKE 1 TABLET (5 MG TOTAL) BY MOUTH DAILY. 05/09/14  Yes Abner Greenspan, MD  olmesartan-hydrochlorothiazide (BENICAR HCT) 20-12.5 MG per tablet Take 1 tablet by mouth daily. 11/14/13  Yes Abner Greenspan, MD  ticlopidine (TICLID) 250 MG tablet Take 1 tablet (250 mg total) by mouth 2 (two) times daily with a meal. 02/16/13  Yes Ripudeep Krystal Eaton, MD  tobramycin-dexamethasone [facility]) ophthalmic solution Place 1 drop into both eyes daily as needed (dryness).  07/12/13  Yes Historical  Provider, MD  diphenhydrAMINE (BENADRYL) 25 MG tablet Take 50 mg by mouth every 8 (eight) hours as needed for allergies.     Historical Provider, MD  HYDROcodone-acetaminophen (NORCO/VICODIN) 5-325 MG per tablet Take 1-2 tablets by mouth every 4 (four) hours as needed for moderate pain or severe pain. 06/24/14   Charlesetta Shanks, MD  ondansetron (ZOFRAN ODT) 4 MG disintegrating tablet Take 1 tablet (4 mg total) by mouth every 4 (four) hours as needed for nausea or vomiting. 06/24/14   Charlesetta Shanks, MD   BP 157/59 mmHg  Pulse 60  Temp(Src) 98 F (36.7 C) (Oral)  Resp 17  SpO2 97% Physical Exam  Constitutional: She is oriented to person, place, and time. She appears well-developed and well-nourished.  HENT:  Head: Normocephalic and atraumatic.  Eyes: EOM are normal. Pupils are equal, round, and reactive to light.  Neck: Neck supple.  Cardiovascular: Normal rate, regular rhythm, normal heart sounds and intact distal pulses.   Pulmonary/Chest: Effort normal and breath sounds normal.  Abdominal: Soft. Bowel sounds are normal. She exhibits no distension. There is no tenderness (Moderate right lateral to mid upper quadrant tenderness. No guarding. No CVA tenderness.).  Musculoskeletal: Normal range of motion. She exhibits no edema.  Neurological: She is alert and oriented to person, place, and time. She has normal strength. Coordination normal. GCS eye subscore is 4. GCS verbal subscore is 5. GCS motor subscore is 6.  Skin: Skin is warm, dry and intact.  Psychiatric: She has a normal mood and affect.    ED Course  Procedures (including critical care time) Labs Review Labs Reviewed  COMPREHENSIVE METABOLIC PANEL - Abnormal; Notable for the following:    Total Bilirubin <0.2 (*)    GFR calc non Af Amer 56 (*)    GFR calc Af Amer 65 (*)    All other components within normal limits  URINALYSIS, ROUTINE W REFLEX MICROSCOPIC - Abnormal; Notable for the following:    Hgb urine dipstick TRACE  (*)    All other components within normal limits  URINE MICROSCOPIC-ADD ON - Abnormal; Notable for the following:    Squamous Epithelial / LPF FEW (*)    All other components within normal limits  CBG MONITORING, ED - Abnormal; Notable for the following:    Glucose-Capillary 68 (*)    All other components within normal limits  CBG MONITORING, ED - Abnormal; Notable for the following:    Glucose-Capillary 110 (*)    All other components within normal limits  CBC WITH DIFFERENTIAL  LIPASE, BLOOD    Imaging Review Ct Abdomen Pelvis W Contrast  06/24/2014   CLINICAL DATA:  Initial evaluation for right lower quadrant abdominal pain since yesterday. History of prior appendectomy.  EXAM: CT ABDOMEN AND PELVIS WITH CONTRAST  TECHNIQUE: Multidetector CT imaging of the abdomen and pelvis was performed using the standard protocol following bolus administration of intravenous contrast.  CONTRAST:  110mL OMNIPAQUE IOHEXOL 300 MG/ML  SOLN  COMPARISON:  Prior ultrasound from earlier the same day as well as CT from 12/03/2007.  FINDINGS: Mild subsegmental atelectasis seen dependently within the visualized lung bases. No pleural or pericardial effusion.  The liver demonstrates a normal contrast enhanced appearance. Gallbladder within normal limits. No biliary dilatation. Spleen, adrenal glands, and pancreas demonstrate a normal contrast enhanced appearance.  Kidneys are equal in size with symmetric enhancement. No nephrolithiasis, hydronephrosis, or focal enhancing renal mass.  Stomach within normal limits. No evidence for bowel obstruction. Appendix is not visualized, consistent with history of prior appendectomy. No acute inflammatory changes seen within the right lower quadrant. No abnormal wall thickening, mucosal enhancement, or inflammatory fat stranding seen about the bowels.  Bladder within normal limits.  Uterus and ovaries are unremarkable.  No free air or fluid. No adenopathy. Normal intravascular  enhancement seen within the intra-abdominal aorta and its branch vessels. Mild calcified atherosclerotic plaque present within the intra-abdominal aorta. No aneurysm.  No acute osseous abnormality. No worrisome lytic or blastic osseous lesions. Scoliosis with multilevel degenerative changes present within the visualized spine, most severe at L5-S1.  IMPRESSION: 1. No CT evidence for acute intra-abdominal or pelvic process. 2. Status post appendectomy.   Electronically Signed   By: Jeannine Boga M.D.   On: 06/24/2014 00:09   US Abdomen Limited  06/23/2014   CLINICAL DATA:  Patient with right upper quadrant abdominal pain.  EXAM: US ABDOMEN LIMITED - RIGHT UPPER QUADRANT  COMPARISON:  CT 12/03/2007  FINDINGS: Gallbladder:  No gallstones or wall thickening visualized. No sonographic Murphy sign noted.  Common bile duct:  Diameter: 6 mm  Liver:  No focal lesion identified. Within normal limits in parenchymal echogenicity.  IMPRESSION: No cholelithiasis or sonographic evidence for acute cholecystitis.   Electronically Signed   By: Lovey Newcomer M.D.   On: 06/23/2014 21:45     EKG Interpretation None     23:59 pain resolved. MDM   Final diagnoses:  Abdominal pain   The patient's pain has resolved. It sounded most consistent with biliary colic however this point time the patient has ruled out for gallstones or cholecystitis. CT scan study does not show an acute intra-abdominal process present. The patient's examination is nonsurgical and she is pain-free at this time. The patient is given signs and symptoms for which return. She is discharged in stable condition.    Charlesetta Shanks, MD 06/24/14 [phone]

## 2014-06-23 NOTE — ED Notes (Signed)
Called CT. Patient finished contrast.

## 2014-06-24 MED ORDER — ONDANSETRON 4 MG PO TBDP: 4.0000 mg | ORAL_TABLET | ORAL | Status: DC | PRN

## 2014-06-24 MED ORDER — HYDROCODONE-ACETAMINOPHEN 5-325 MG PO TABS: 1.0000 | ORAL_TABLET | ORAL | Status: DC | PRN

## 2014-06-24 NOTE — Discharge Instructions (Signed)
Abdominal Pain °Many things can cause abdominal pain. Usually, abdominal pain is not caused by a disease and will improve without treatment. It can often be observed and treated at home. Your health care provider will do a physical exam and possibly order blood tests and X-rays to help determine the seriousness of your pain. However, in many cases, more time must pass before a clear cause of the pain can be found. Before that point, your health care provider may not know if you need more testing or further treatment. °HOME CARE INSTRUCTIONS  °Monitor your abdominal pain for any changes. The following actions may help to alleviate any discomfort you are experiencing: °· Only take over-the-counter or prescription medicines as directed by your health care provider. °· Do not take laxatives unless directed to do so by your health care provider. °· Try a clear liquid diet (broth, tea, or water) as directed by your health care provider. Slowly move to a bland diet as tolerated. °SEEK MEDICAL CARE IF: °· You have unexplained abdominal pain. °· You have abdominal pain associated with nausea or diarrhea. °· You have pain when you urinate or have a bowel movement. °· You experience abdominal pain that wakes you in the night. °· You have abdominal pain that is worsened or improved by eating food. °· You have abdominal pain that is worsened with eating fatty foods. °· You have a fever. °SEEK IMMEDIATE MEDICAL CARE IF:  °· Your pain does not go away within 2 hours. °· You keep throwing up (vomiting). °· Your pain is felt only in portions of the abdomen, such as the right side or the left lower portion of the abdomen. °· You pass bloody or black tarry stools. °MAKE SURE YOU: °· Understand these instructions.   °· Will watch your condition.   °· Will get help right away if you are not doing well or get worse.   °Document Released: 04/02/2005 Document Revised: 06/28/2013 Document Reviewed: 03/02/2013 °ExitCare® Patient Information  ©2015 [employer]. This information is not intended to replace advice given to you by your health care provider. Make sure you discuss any questions you have with your health care provider. ° ° °Emergency Department Resource Guide °1) Find a Doctor and Pay Out of Pocket °Although you won't have to find out who is covered by your insurance plan, it is a good idea to ask around and get recommendations. You will then need to call the office and see if the doctor you have chosen will accept you as a new patient and what types of options they offer for patients who are self-pay. Some doctors offer discounts or will set up payment plans for their patients who do not have insurance, but you will need to ask so you aren't surprised when you get to your appointment. ° °2) Contact Your Local Health Department °Not all health departments have doctors that can see patients for sick visits, but many do, so it is worth a call to see if yours does. If you don't know where your local health department is, you can check in your phone book. The CDC also has a tool to help you locate your state's health department, and many state websites also have listings of all of their local health departments. ° °3) Find a Walk-in Clinic °If your illness is not likely to be very severe or complicated, you may want to try a walk in clinic. These are popping up all over the country in pharmacies, drugstores, and shopping centers. They're   usually staffed by nurse practitioners or physician assistants that have been trained to treat common illnesses and complaints. They're usually fairly quick and inexpensive. However, if you have serious medical issues or chronic medical problems, these are probably not your best option. ° °No Primary Care Doctor: °- Call [facility] at  832-8000 - they can help you locate a primary care doctor that  accepts your insurance, provides certain services, etc. °- Physician Referral Service- 1-800-533-3463 ° °Chronic  Pain Problems: °Organization         Address  Phone   Notes  °[facility]  (336) 297-2271 Patients need to be referred by their primary care doctor.  ° °Medication Assistance: °Organization         Address  Phone   Notes  °[place] [facility] [address] °[place], [place] [address] (336) 641-8030 --Must be a resident of [place] °-- Must have NO insurance coverage whatsoever (no Medicaid/ Medicare, etc.) °-- The pt. MUST have a primary care doctor that directs their care regularly and follows them in the community °  °[facility]  (866) 331-1348   °[employer]  (888) 892-1162   ° °Agencies that provide inexpensive medical care: °Organization         Address  Phone   Notes  °[facility]  (336) 832-8035   °[facility]    (336) 832-7272   °[facility] [address] °[place], [place] [address] (336) 832-4777   °[facility] [address], °[place] (336) 271-4999   °[facility]    (336) 373-0678   °[facility]    (336) 272-1050   °[facility] ° [address], [place] Phone:  (336) 832-4444, Fax:  (336) 832-4440 Hours of Operation:  9 am - 6 pm, M-F.  Also accepts Medicaid/Medicare and self-pay.  °[facility] ° [address], [place] Phone: (336) 832-3150, Fax: (336) 832-3151. Hours of Operation:  8:30 am - 5:30 pm, M-F.  Also accepts Medicaid and self-pay.  °[facility] [place] [address], [place] Phone: (336) 878-6027   °[facility] [address], [place], [place] (336)723-1848, Ext. 123 Mondays & Thursdays: 7-9 AM.  First 15 patients are seen on a first come, first serve basis. °  ° °Medicaid-accepting [place] Providers: ° °Organization         Address  Phone   Notes  °[facility] [address], [place] (336) 641-2100 Also  accepts self-pay patients.  °[facility] [address], [place] ° (336) 856-9996   °[facility] [address], [place] (336) 288-8857   °[facility] [address] [place] [address], [place] (336) 299-7000   °Veita Bland [address], [place]  ° (336) 373-1557 Only accepts [place] Access Medicaid patients after they have their name applied to their card.  ° °Self-Pay (no insurance) in [place]: ° °Organization         Address  Phone   Notes  °Sickle Cell Patients, [facility] [address], [place] (336) 832-1970   °[facility] [address], [place] (336) 832-4400   °[facility] ° [address], [place] (336) 992-4800   °[facility]/Dr. Osei-Bonsu ° [address] [place] [address], [place] or [address], [place] (336) 841-8500 Phone number for both [place] and [place] locations is the same.  °[facility] [address], [place] (336) 299-0000   °[facility] [address] [place] [address], [place] or [address] (336) 852-7530 °(336) 878-2260   °[facility] [address], [place] (336) 350-1642, phone; (336) 294-5005, fax Sees patients 1st and 3rd Saturday of every month.  Must not qualify for public or private insurance (i.e. Medicaid, Medicare, [place] Health Choice, Veterans' Benefits) • Household income should be no more than 200% of the poverty level •The clinic cannot treat you if you are pregnant or think you are pregnant • Sexually transmitted diseases are not treated at the clinic.  ° ° °Dental Care: °Organization         Address  Phone  Notes  °[facility] [address], [place] (336) 641-6152 Accepts children up to age 21 who are enrolled in Medicaid or [place] Health Choice; pregnant  women with a Medicaid card; and children who have applied for Medicaid or [place] Health Choice, but were declined, whose parents can pay a reduced fee at time of service.  °[facility] [place]  [address], [place] (336) 641-7733 Accepts children up to age 21 who are enrolled in Medicaid or [place] Health Choice; pregnant women with a Medicaid card; and children who have applied for Medicaid or [place] Health Choice, but were declined, whose parents can pay a reduced fee at time of service.  °[facility] ° [address], [place] (336) 641-4533 Patients are seen by appointment only. Walk-ins are not accepted. [facility] will see patients 18 years of age and older. °Monday - Tuesday (8am-5pm) °Most Wednesdays (8:30-5pm) °$30 per visit, cash only  °[facility] ° [address], [place] (336) 641-4533 Patients are seen by appointment only. Walk-ins are not accepted. [facility] will see patients 18 years of age and older. °One Wednesday Evening (Monthly: Volunteer Based).  $30 per visit, cash only  °[facility]  (919) 537-3737 for adults; Children under age 4, call Graduate Pediatric Dentistry at (919) [phone]. Children aged 4-14, please call (919) 537-3737 to request a pediatric application. ° Dental services are provided in all areas of dental care including fillings, crowns and bridges, complete and partial dentures, implants, gum treatment, root canals, and extractions. Preventive care is also provided. Treatment is provided to both adults and children. °Patients are selected via a lottery and there is often a waiting list. °  °[facility] [address], °[place] ° (336) 763-8833 [email or web address] °  °[facility] [address], [place],  (336)723-1848, Ext. 123 Second and Fourth Thursday of each month, opens at 6:30 AM; Clinic ends at 9 AM.  Patients are  seen on a first-come first-served basis, and a limited number are seen during each clinic.  ° °[facility] ° [address], [place],  (336) 723-7904   Eligibility Requirements °You must have lived in [place], [place], or [place] counties for at least the last three months. °  You cannot be eligible for state or federal sponsored healthcare insurance, including Veterans Administration, Medicaid, or Medicare. °  You generally cannot be eligible for healthcare insurance through   your employer.  °  How to apply: °Eligibility screenings are held every Tuesday and Wednesday afternoon from 1:00 pm until 4:00 pm. You do not need an appointment for the interview!  °[facility] [address], [place], [place] 336-631-2330   °[facility]  336-342-8273   °[facility]  336-703-3100   °[facility]  336-570-6415   ° °Behavioral Health Resources in the Community: °Intensive Outpatient Programs °Organization         Address  Phone  Notes  °[facility] [address], [place], [place] 336-878-6098   °[facility] [address], [place], [place] 336-832-9800   °[facility] [address], [place], [place] ° 336-882-2125   °[facility] [address],  °[place], [place] 1-800-853-5163 or 336-641-4981   °Substance Abuse Resources °Organization         Address  Phone  Notes  °[facility]  336-882-2125   °[facility]  336-784-9470   °[facility]  336-285-9073   °[facility]  336-845-3988   °Residential & Outpatient Substance Abuse Program  1-800-659-3381   °Psychological Services °Organization         Address  Phone  Notes  ° Health  336- 832-9600   °[facility]  336- 378-7881   °[facility] [address], [place] 1-800-853-5163 or 336-641-4981   ° °Mobile Crisis  Teams °Organization         Address  Phone  Notes  °[facility], Mobile Crisis Care Unit  1-877-626-1772   °Assertive °[facility] ° [address] [place], [place] 336-834-9664   °Sharon DeEsch [address] °[place] 336-554-5454   ° °Self-Help/Support Groups °Organization         Address  Phone             Notes  °[facility] - variety of support groups  336- 373-1402 Call for more information  °Narcotics Anonymous (NA), [facility] [address], °[place]  2 meetings at this location  ° °Residential Treatment Programs °Organization         Address  Phone  Notes  °[facility] [address],    °[place]  1-866-801-8205   °[facility] ° [address], [place], [place] 704-293-8524   °[facility] [address], [place] 336-845-3988 Admissions: 8am-3pm M-F  °[facility] [address]    °[place], [place] 336-841-1104   °[facility] [address], [place], [place] 336-379-7146   °[facility] [address]  °[place], [place] 336-285-9073   °[facility] Intensive Outpatient [address], [place], [place] 336-852-3033   °[facility] [address]  °[place], [place] 1-877-615-2722 or 336-784-9470   °[facility] [address] [place], [place] 336-227-7417 Accepts Medicaid  °[facility] [address]  ° [place] 1-800-659-3381 Substance Abuse/Addiction Treatment  ° °[place] Behavioral Health Resources °Organization         Address  Phone  Notes  °[facility]  (888) 581-9988   °Julie Brannon, PhD [address] [place], [place]   (336) 349-5553 or (336) 951-0000   °[facility]   [address] °[place],  (336) 349-4454   °  [facility] [address], [place], [place] (336) 342-8316  Insurance/Medicaid/sponsorship through [facility]  °[facility] [address] [place] [address]                                    [place], [place] (336) 342-8316 Therapy/tele-psych/case  °[facility] [address]  ° [place], [place] (336) 349-2233    °Dr. Arfeen  (336) 349-4544   °[facility] [place]  [employer] [place] [facility] 1) [address], [place] °2) [address], [place] °3)  [address], [place] (336) 349-3220 °(336) 342-7768 ° °(336) 342-8140   °[place] [facility] (336) 342-1394 or (336) 342-3537 (After Hours)    ° ° ° ° °

## 2014-06-26 ENCOUNTER — Ambulatory Visit: Payer: Commercial Managed Care - HMO | Admitting: *Deleted

## 2014-06-26 ENCOUNTER — Telehealth: Payer: Self-pay

## 2014-06-26 DIAGNOSIS — I639 Cerebral infarction, unspecified: Secondary | ICD-10-CM

## 2014-06-26 NOTE — Telephone Encounter (Signed)
Left voicemail letting pt know Dr. Glori Bickers does want her to go to ER

## 2014-06-26 NOTE — Telephone Encounter (Signed)
PLEASE NOTE: All timestamps contained within this report are represented as [place] Standard Time. CONFIDENTIALTY NOTICE: This fax transmission is intended only for the addressee. It contains information that is legally privileged, confidential or otherwise protected from use or disclosure. If you are not the intended recipient, you are strictly prohibited from reviewing, disclosing, copying using or disseminating any of this information or taking any action in reliance on or regarding this information. If you have received this fax in error, please notify us immediately by telephone so that we can arrange for its return to [place]. Phone: [phone], Toll-Free: [phone], Fax: [phone] Page: 1 of 2 Call Id: [id number] Margaret Vazquez: Margaret Vazquez Gender: Female DOB: 08-05-1935 Age: 78 Y 29 M 20 D Return Phone Number: [phone] (Primary) Address: [address] City/State/Zip: [place] [address] Client [facility] - Client Client Site [place] Contact Type Call Call Type Triage / Clinical Relationship To Patient Self Return Phone Number [phone] (Primary) Chief Complaint Abdominal Pain Initial Comment Caller states they are having abdominal pain. PreDisposition Did not know what to do Nurse Assessment Nurse: Burt Ek, RN, Vonna Kotyk Date/Time Eilene Ghazi Time): 06/23/2014 5:41:58 PM Confirm and document reason for call. If symptomatic, describe symptoms. ---Caller states they are having dull aching intermitted mid abdominal pain. Has the patient traveled out of the country within the last 30 days? ---No Does the patient require triage? ---Yes Related visit to physician within the last 2 weeks? ---No Does the PT have any chronic conditions? (i.e. diabetes, asthma, etc.) ---Yes List chronic conditions.  ---Diabetes Stroke x2 Guidelines Guideline Title Affirmed Question Affirmed Notes Nurse Date/Time Eilene Ghazi Time) Abdominal Pain - Female [1] SEVERE pain AND [2] age > 46 Burt Ek, RN, Vonna Kotyk 06/23/2014 5:44:32 PM Disp. Time Eilene Ghazi Time) Disposition Final User 06/23/2014 5:50:07 PM Go to ED Now Yes Burt Ek, RN, Aviva Kluver Understands: Yes Disagree/Comply: Comply Care Advice Given Per Guideline GO TO ED NOW: You need to be seen in the Emergency Department. Go to the ER at ___________ [place] now. Drive carefully. DRIVING: Another adult should drive. BRING MEDICINES: * Please bring a list of your current medicines when you go to the Emergency Department (ER). * It is also a good idea to bring the pill bottles too. This will help the doctor to make certain you PLEASE NOTE: All timestamps contained within this report are represented as [place] Standard Time. CONFIDENTIALTY NOTICE: This fax transmission is intended only for the addressee. It contains information that is legally privileged, confidential or otherwise protected from use or disclosure. If you are not the intended recipient, you are strictly prohibited from reviewing, disclosing, copying using or disseminating any of this information or taking any action in reliance on or regarding this information. If you have received this fax in error, please notify us immediately by telephone so that we can arrange for its return to [place]. Phone: [phone], Toll-Free: [phone], Fax: [phone] Page: 2 of 2 Call Id: [id number] Care Advice Given Per Guideline are taking the right medicines and the right dose. NOTHING BY MOUTH: Do not eat or drink anything for now. (Reason: condition may need surgery and general anesthesia) CARE ADVICE given per Abdominal Pain, Female (Adult) guideline. After Care Instructions Given Call Event Type User Date / Time Description Referrals [facility] - ED

## 2014-06-26 NOTE — Telephone Encounter (Signed)
Agree with adv to go to ER  

## 2014-06-27 NOTE — Progress Notes (Signed)
Loop recorder 

## 2014-07-04 ENCOUNTER — Encounter: Payer: Self-pay | Admitting: Internal Medicine

## 2014-07-17 ENCOUNTER — Telehealth: Payer: Self-pay | Admitting: Neurology

## 2014-07-17 MED ORDER — TICLOPIDINE HCL 250 MG PO TABS: 250.0000 mg | ORAL_TABLET | Freq: Two times a day (BID) | ORAL | Status: DC

## 2014-07-17 NOTE — Telephone Encounter (Signed)
Last OV note says: Continue Ticlodipine for stroke prevention  I called [facility].  Spoke with Jess who asked that The Rx be faxed to them at [phone].

## 2014-07-17 NOTE — Telephone Encounter (Signed)
Pt needs Rx for ticlopidine (TICLID) 250 MG tablet sent to [email or web address]. The number is [phone].  Please call and advise.

## 2014-07-24 ENCOUNTER — Other Ambulatory Visit: Payer: Self-pay

## 2014-07-24 MED ORDER — TICLOPIDINE HCL 250 MG PO TABS: 250.0000 mg | ORAL_TABLET | Freq: Two times a day (BID) | ORAL | Status: DC

## 2014-07-25 NOTE — Telephone Encounter (Signed)
Rx signed and faxed.

## 2014-07-27 ENCOUNTER — Ambulatory Visit (INDEPENDENT_AMBULATORY_CARE_PROVIDER_SITE_OTHER): Payer: Commercial Managed Care - HMO | Admitting: *Deleted

## 2014-07-27 DIAGNOSIS — I639 Cerebral infarction, unspecified: Secondary | ICD-10-CM

## 2014-07-27 DIAGNOSIS — I6350 Cerebral infarction due to unspecified occlusion or stenosis of unspecified cerebral artery: Secondary | ICD-10-CM

## 2014-07-27 NOTE — Progress Notes (Signed)
Loop recorder 

## 2014-08-04 ENCOUNTER — Encounter: Payer: Self-pay | Admitting: Internal Medicine

## 2014-08-07 ENCOUNTER — Other Ambulatory Visit: Payer: Self-pay

## 2014-08-07 DIAGNOSIS — Z1231 Encounter for screening mammogram for malignant neoplasm of breast: Secondary | ICD-10-CM

## 2014-08-13 ENCOUNTER — Telehealth: Payer: Self-pay | Admitting: Family Medicine

## 2014-08-13 DIAGNOSIS — I10 Essential (primary) hypertension: Secondary | ICD-10-CM

## 2014-08-13 DIAGNOSIS — E119 Type 2 diabetes mellitus without complications: Secondary | ICD-10-CM

## 2014-08-13 NOTE — Telephone Encounter (Signed)
-----   Message from Ellamae Sia sent at 08/10/2014 10:38 AM EST ----- Regarding: Lab orders for Monday, 2.8.16 Lab orders for a f/u appt

## 2014-08-14 ENCOUNTER — Other Ambulatory Visit (INDEPENDENT_AMBULATORY_CARE_PROVIDER_SITE_OTHER): Payer: PPO

## 2014-08-14 DIAGNOSIS — E119 Type 2 diabetes mellitus without complications: Secondary | ICD-10-CM

## 2014-08-14 DIAGNOSIS — I10 Essential (primary) hypertension: Secondary | ICD-10-CM

## 2014-08-14 LAB — COMPREHENSIVE METABOLIC PANEL
ALT: 14 U/L (ref 0–35)
AST: 15 U/L (ref 0–37)
Albumin: 3.9 g/dL (ref 3.5–5.2)
Alkaline Phosphatase: 83 U/L (ref 39–117)
BUN: 19 mg/dL (ref 6–23)
CO2: 30 mEq/L (ref 19–32)
Calcium: 9 mg/dL (ref 8.4–10.5)
Chloride: 105 mEq/L (ref 96–112)
Creatinine, Ser: 1.04 mg/dL (ref 0.40–1.20)
GFR: 54.63 mL/min — ABNORMAL LOW (ref >60.00)
Glucose, Bld: 123 mg/dL — ABNORMAL HIGH (ref 70–99)
Potassium: 4 mEq/L (ref 3.5–5.1)
Sodium: 139 mEq/L (ref 135–145)
Total Bilirubin: 0.3 mg/dL (ref 0.2–1.2)
Total Protein: 7 g/dL (ref 6.0–8.3)

## 2014-08-14 LAB — HEMOGLOBIN A1C: Hgb A1c MFr Bld: 6.9 % — ABNORMAL HIGH (ref 4.6–6.5)

## 2014-08-15 LAB — MDC_IDC_ENUM_SESS_TYPE_REMOTE

## 2014-08-21 ENCOUNTER — Ambulatory Visit: Payer: Commercial Managed Care - HMO | Admitting: Family Medicine

## 2014-08-22 ENCOUNTER — Telehealth: Payer: Self-pay | Admitting: Cardiology

## 2014-08-22 ENCOUNTER — Ambulatory Visit
Admission: RE | Admit: 2014-08-22 | Discharge: 2014-08-22 | Disposition: A | Discharge status: Home / Self Care | Payer: PPO | Source: Ambulatory Visit

## 2014-08-22 DIAGNOSIS — Z1231 Encounter for screening mammogram for malignant neoplasm of breast: Secondary | ICD-10-CM

## 2014-08-22 NOTE — Telephone Encounter (Signed)
Called pt and informed her that we have not received a transmission since 08-15-14. Pt verbalized understanding.

## 2014-08-23 ENCOUNTER — Encounter: Payer: Self-pay | Admitting: Family Medicine

## 2014-08-23 ENCOUNTER — Ambulatory Visit (INDEPENDENT_AMBULATORY_CARE_PROVIDER_SITE_OTHER): Payer: PPO | Admitting: Family Medicine

## 2014-08-23 VITALS — BP 160/76 | HR 55 | Temp 98.1°F | Ht 69.0 in | Wt 206.4 lb

## 2014-08-23 DIAGNOSIS — I10 Essential (primary) hypertension: Secondary | ICD-10-CM

## 2014-08-23 DIAGNOSIS — E663 Overweight: Secondary | ICD-10-CM | POA: Insufficient documentation

## 2014-08-23 DIAGNOSIS — E669 Obesity, unspecified: Secondary | ICD-10-CM

## 2014-08-23 DIAGNOSIS — E119 Type 2 diabetes mellitus without complications: Secondary | ICD-10-CM

## 2014-08-23 NOTE — Patient Instructions (Signed)
Your blood pressure is too high  BP: (!) 160/76 mmHg also your weight is up  Work on lower calorie diet and exercise  Check blood pressure with your cuff and keep a log - daily at different times while relaxed Get a list of affordable blood pressure medicines from your insurance  Also bring your cuff and readings to next visit Follow up in 1-2 weeks

## 2014-08-23 NOTE — Progress Notes (Signed)
Subjective:    Patient ID: Margaret Vazquez, female    DOB: 12-30-1935, 79 y.o.   MRN: [id number]  HPI Here for f/u of chronic conditions   More back pain lately - on the L in upper low back   Wt is up 4 lb with bmi of 30  Not eating as well / wants to loose   Taking care of herself  She does not exercise however   Has lost several friends and family members  Brother in hospital with CHF  That has been hard on her   bp is up today  No cp or palpitations or headaches or edema  No side effects to medicines  BP Readings from Last 3 Encounters:  08/23/14 160/76  06/24/14 152/52  03/16/14 156/70     She checks it at home - often one -teens /60-that is in the am  Her benicar is more $$ now with "[employer]" insurance   Was high when she went to ER with abd pain (never figured out what it was)  Did CT scan and given MS  Never had a pain since   Diabetes Home sugar results  DM diet -not eating as well over holidays and xmas Exercise not regular  Symptoms- normal  A1C last  Lab Results  Component Value Date   HGBA1C 6.9* 08/14/2014  was 6.5   No problems with medications  Renal protection arb  Last eye exam 3/15 -will be due soon   Patient Active Problem List   Diagnosis Date Noted  . Type 2 diabetes mellitus with vascular disease 01/12/2014  . TIA (transient ischemic attack) 01/11/2014  . CVA (cerebral infarction) 01/11/2014  . Sensory disturbance 01/11/2014  . Pain of right thumb 11/14/2013  . Left hip pain 11/14/2013  . Chest pain at rest 08/31/2013  . LOOP Recorder LINQ 06/07/2013  . Fatigue 06/07/2013  . Sinusitis, chronic 05/16/2013  . Acute ischemic stroke 02/15/2013  . Numbness and tingling of right arm 02/14/2013  . Encounter for Medicare annual wellness exam 10/27/2012  . Encopresis 12/15/2011  . Post-menopausal 10/28/2011  . PERSONAL HX COLONIC POLYPS 09/04/2009  . VITAMIN B12 DEFICIENCY 03/06/2009  . UNSPECIFIED ANEMIA 02/13/2009  . ALLERGIC RHINITIS  02/14/2008  . BACK PAIN, LUMBAR 11/03/2007  . HYPERLIPIDEMIA 11/24/2006  . Essential hypertension 11/24/2006  . Diabetes type 2, controlled 10/09/2006  . GERD 10/09/2006  . OVERACTIVE BLADDER 10/09/2006  . INCONTINENCE, URGE 10/09/2006  . SKIN CANCER, HX OF 10/09/2006   Past Medical History  Diagnosis Date  . Hypertension   . Diabetes mellitus     type II  . Hyperlipidemia     myalgias with Lipitor and Zetia  . GERD (gastroesophageal reflux disease)   . Skin cancer     hx of basal cell/ak's  . Colon polyps 2009  . Allergy history, drug     Aspirin  . Cervical stenosis of spine     With neck pain  . CAD (coronary artery disease) 04/08/11    non obst by cath  . Migraine   . TIA (transient ischemic attack)   . Stroke August 2014   Past Surgical History  Procedure Laterality Date  . Appendectomy    . Tubal ligation      BTL  . Breast surgery  1992    breast biopsy  . Eye surgery  2005    tear duct surgery  . Nasal sinus surgery  01/2005  . Cardiac catheterization  1993  LHC: normal coronaries.   . Stress cardiolite  11/1999    Normal/ negative  . Cystoscopy w/ decannulation  03/2000    Normal  . Abd [place]  07/2003    Negative  . Tear duct surgery  2005  . Colonoscopy  12/2007    Adenomatous colon polyps  . Cardiac catheterization  04/07/2011    non obst CAD (Dr Burt Knack)  . Tee without cardioversion N/A 02/16/2013    Procedure: TRANSESOPHAGEAL ECHOCARDIOGRAM (TEE);  Surgeon: Josue Hector, MD;  Location: [facility] ENDOSCOPY;  Service: Cardiovascular;  Laterality: N/A;  . Loop recorder implant    . Loop recorder implant N/A 02/16/2013    Procedure: LOOP RECORDER IMPLANT;  Surgeon: Deboraha Sprang, MD;  Location: [facility] CATH LAB;  Service: Cardiovascular;  Laterality: N/A;   History  Substance Use Topics  . Smoking status: Never Smoker   . Smokeless tobacco: Never Used  . Alcohol Use: No   Family History  Problem Relation Age of Onset  . Lung cancer Brother   . Colon  cancer Neg Hx   . Skin cancer Daughter   . Diabetes Sister   . Diabetes Brother   . Heart disease Mother   . Heart disease Father    Allergies  Allergen Reactions  . Bee Venom Hives, Shortness Of Breath and Swelling  . Nabumetone Anaphylaxis  . Amoxicillin-Pot Clavulanate Hives and Swelling    To lips.  . Aspirin Hives  . Atorvastatin Swelling     joint pain/swelling, inc liver tests  . Clopidogrel Bisulfate Hives  . Codeine Nausea And Vomiting  . Ezetimibe Other (See Comments)     fatigue  . Metformin And Related Other (See Comments)    Diarrhea   . Valsartan Other (See Comments)     fatigue   Current Outpatient Prescriptions on File Prior to Visit  Medication Sig Dispense Refill  . CINNAMON PO Take 1 tablet by mouth daily.    . diphenhydrAMINE (BENADRYL) 25 MG tablet Take 50 mg by mouth every 8 (eight) hours as needed for allergies.     Marland Kitchen EPINEPHrine (EPI-PEN) 0.3 mg/0.3 mL DEVI Inject 0.3 mg into the muscle daily as needed (allergic reaction).     . fluticasone (FLONASE) 50 MCG/ACT nasal spray Place 2 sprays into both nostrils 2 (two) times daily.    Marland Kitchen GLIPIZIDE XL 5 MG 24 hr tablet TAKE 1 TABLET (5 MG TOTAL) BY MOUTH DAILY. 30 tablet 5  . olmesartan-hydrochlorothiazide (BENICAR HCT) 20-12.5 MG per tablet Take 1 tablet by mouth daily. 30 tablet 11  . ticlopidine (TICLID) 250 MG tablet Take 1 tablet (250 mg total) by mouth 2 (two) times daily with a meal. 180 tablet 0  . tobramycin-dexamethasone (TOBRADEX) ophthalmic solution Place 1 drop into both eyes daily as needed (dryness).     . [DISCONTINUED] levETIRAcetam (KEPPRA) 250 MG tablet Take 1 tablet (250 mg total) by mouth 2 (two) times daily. 60 tablet 0   No current facility-administered medications on file prior to visit.           Review of Systems Review of Systems  Constitutional: Negative for fever, appetite change, fatigue and unexpected weight change.  Eyes: Negative for pain and visual disturbance.    Respiratory: Negative for cough and shortness of breath.   Cardiovascular: Negative for cp or palpitations    Gastrointestinal: Negative for nausea, diarrhea and constipation. neg for abd pain (resolved) Genitourinary: Negative for urgency and frequency.  Skin: Negative for pallor or rash  Neurological: Negative for weakness, light-headedness, numbness and headaches.  Hematological: Negative for adenopathy. Does not bruise/bleed easily.  Psychiatric/Behavioral: Negative for dysphoric mood. The patient is somewhat anxious , pos for stressors         Objective:   Physical Exam  Constitutional: She appears well-developed and well-nourished. No distress.  HENT:  Head: Normocephalic and atraumatic.  Mouth/Throat: Oropharynx is clear and moist.  Eyes: Conjunctivae and EOM are normal. Pupils are equal, round, and reactive to light. Right eye exhibits no discharge. Left eye exhibits no discharge. No scleral icterus.  Neck: Normal range of motion. Neck supple. No JVD present. Carotid bruit is not present. No thyromegaly present.  Cardiovascular: Normal rate, regular rhythm, normal heart sounds and intact distal pulses.  Exam reveals no gallop.   Pulmonary/Chest: Effort normal and breath sounds normal. No respiratory distress. She has no wheezes. She has no rales.  Abdominal: Soft. Bowel sounds are normal. She exhibits no distension, no abdominal bruit and no mass. There is no tenderness.  Musculoskeletal: She exhibits no edema or tenderness.  Lymphadenopathy:    She has no cervical adenopathy.  Neurological: She is alert. She has normal reflexes. No cranial nerve deficit. She exhibits normal muscle tone. Coordination normal.  Skin: Skin is warm and dry. No rash noted. No erythema. No pallor.  Psychiatric: She has a normal mood and affect.          Assessment & Plan:   Problem List Items Addressed This Visit      Cardiovascular and Mediastinum   Essential hypertension - Primary    bp  is elevated  Pt is having issues with medication coverage/new insurance  Will get a list of covered alternatives and get that to me  Disc lifestyle change/DASH diet and wt loss  She will check bp at home and f/u with her cuff to test it as well         Endocrine   Diabetes type 2, controlled    Lab Results  Component Value Date   HGBA1C 6.9* 08/14/2014   Not as good as prev Wt gain also  Rev low glycemic diet and need for wt loss and exercise  F/u planned         Other   Obesity    Discussed how this problem influences overall health and the risks it imposes  Reviewed plan for weight loss with lower calorie diet (via better food choices and also portion control or program like weight watchers) and exercise building up to or more than 30 minutes 5 days per week including some aerobic activity   She has stressors and motivational factors

## 2014-08-23 NOTE — Progress Notes (Signed)
Pre visit review using our clinic review tool, if applicable. No additional management support is needed unless otherwise documented below in the visit note. 

## 2014-08-25 ENCOUNTER — Telehealth: Payer: Self-pay | Admitting: Family Medicine

## 2014-08-25 ENCOUNTER — Ambulatory Visit (INDEPENDENT_AMBULATORY_CARE_PROVIDER_SITE_OTHER): Payer: PPO | Admitting: *Deleted

## 2014-08-25 DIAGNOSIS — I639 Cerebral infarction, unspecified: Secondary | ICD-10-CM

## 2014-08-25 DIAGNOSIS — I6350 Cerebral infarction due to unspecified occlusion or stenosis of unspecified cerebral artery: Secondary | ICD-10-CM

## 2014-08-25 NOTE — Telephone Encounter (Signed)
emmi mailed  °

## 2014-08-27 NOTE — Assessment & Plan Note (Signed)
Lab Results  Component Value Date   HGBA1C 6.9* 08/14/2014   Not as good as prev Wt gain also  Rev low glycemic diet and need for wt loss and exercise  F/u planned

## 2014-08-27 NOTE — Assessment & Plan Note (Signed)
Discussed how this problem influences overall health and the risks it imposes  Reviewed plan for weight loss with lower calorie diet (via better food choices and also portion control or program like weight watchers) and exercise building up to or more than 30 minutes 5 days per week including some aerobic activity   She has stressors and motivational factors

## 2014-08-27 NOTE — Assessment & Plan Note (Signed)
bp is elevated  Pt is having issues with medication coverage/new insurance  Will get a list of covered alternatives and get that to me  Disc lifestyle change/DASH diet and wt loss  She will check bp at home and f/u with her cuff to test it as well

## 2014-08-28 NOTE — Progress Notes (Signed)
Loop recorder 

## 2014-08-30 ENCOUNTER — Ambulatory Visit (INDEPENDENT_AMBULATORY_CARE_PROVIDER_SITE_OTHER): Payer: PPO | Admitting: Family Medicine

## 2014-08-30 ENCOUNTER — Encounter: Payer: Self-pay | Admitting: Family Medicine

## 2014-08-30 VITALS — BP 144/60 | HR 60 | Temp 98.1°F | Ht 69.0 in | Wt 203.0 lb

## 2014-08-30 DIAGNOSIS — E1159 Type 2 diabetes mellitus with other circulatory complications: Secondary | ICD-10-CM

## 2014-08-30 DIAGNOSIS — I10 Essential (primary) hypertension: Secondary | ICD-10-CM

## 2014-08-30 DIAGNOSIS — E1151 Type 2 diabetes mellitus with diabetic peripheral angiopathy without gangrene: Secondary | ICD-10-CM

## 2014-08-30 MED ORDER — LOSARTAN POTASSIUM-HCTZ 100-25 MG PO TABS: 1.0000 | ORAL_TABLET | Freq: Every day | ORAL | Status: DC

## 2014-08-30 NOTE — Progress Notes (Signed)
Pre visit review using our clinic review tool, if applicable. No additional management support is needed unless otherwise documented below in the visit note. 

## 2014-08-30 NOTE — Patient Instructions (Signed)
Keep eating well and loosing weight  When done with benicar hct- change to losartan hct 100-25  Keep check on blood pressure - check it when relaxed Follow up in mid may with labs prior

## 2014-08-30 NOTE — Progress Notes (Signed)
Subjective:    Patient ID: Margaret Vazquez, female    DOB: Nov 01, 1935, 79 y.o.   MRN: [id number]  HPI Here for f/u of chronic medical conditions   A lot of stress- brother died this weekend (a lot of loss lately)   copay for benicar is high - losartan hct is covered better   bp today is improved from last time BP Readings from Last 3 Encounters:  08/30/14 144/60  08/23/14 160/76  06/24/14 152/52    At home 140s-low 160s over 60s to 70s  No headaches or edema or other symptoms   On benicar hct 20-12.5     Chemistry      Component Value Date/Time   NA 139 08/14/2014 0803   K 4.0 08/14/2014 0803   CL 105 08/14/2014 0803   CO2 30 08/14/2014 0803   BUN 19 08/14/2014 0803   CREATININE 1.04 08/14/2014 0803   CREATININE 0.98 01/27/2014 1448      Component Value Date/Time   CALCIUM 9.0 08/14/2014 0803   ALKPHOS 83 08/14/2014 0803   AST 15 08/14/2014 0803   ALT 14 08/14/2014 0803   BILITOT 0.3 08/14/2014 0803      Patient Active Problem List   Diagnosis Date Noted  . Obesity 08/23/2014  . Type 2 diabetes mellitus with vascular disease 01/12/2014  . TIA (transient ischemic attack) 01/11/2014  . CVA (cerebral infarction) 01/11/2014  . Sensory disturbance 01/11/2014  . Pain of right thumb 11/14/2013  . Left hip pain 11/14/2013  . Chest pain at rest 08/31/2013  . LOOP Recorder LINQ 06/07/2013  . Fatigue 06/07/2013  . Sinusitis, chronic 05/16/2013  . Acute ischemic stroke 02/15/2013  . Numbness and tingling of right arm 02/14/2013  . Encounter for Medicare annual wellness exam 10/27/2012  . Encopresis 12/15/2011  . Post-menopausal 10/28/2011  . PERSONAL HX COLONIC POLYPS 09/04/2009  . VITAMIN B12 DEFICIENCY 03/06/2009  . UNSPECIFIED ANEMIA 02/13/2009  . ALLERGIC RHINITIS 02/14/2008  . BACK PAIN, LUMBAR 11/03/2007  . HYPERLIPIDEMIA 11/24/2006  . Essential hypertension 11/24/2006  . Diabetes type 2, controlled 10/09/2006  . GERD 10/09/2006  . OVERACTIVE BLADDER  10/09/2006  . INCONTINENCE, URGE 10/09/2006  . SKIN CANCER, HX OF 10/09/2006   Past Medical History  Diagnosis Date  . Hypertension   . Diabetes mellitus     type II  . Hyperlipidemia     myalgias with Lipitor and Zetia  . GERD (gastroesophageal reflux disease)   . Skin cancer     hx of basal cell/ak's  . Colon polyps 2009  . Allergy history, drug     Aspirin  . Cervical stenosis of spine     With neck pain  . CAD (coronary artery disease) 04/08/11    non obst by cath  . Migraine   . TIA (transient ischemic attack)   . Stroke August 2014   Past Surgical History  Procedure Laterality Date  . Appendectomy    . Tubal ligation      BTL  . Breast surgery  1992    breast biopsy  . Eye surgery  2005    tear duct surgery  . Nasal sinus surgery  01/2005  . Cardiac catheterization  1993    LHC: normal coronaries.   . Stress cardiolite  11/1999    Normal/ negative  . Cystoscopy w/ decannulation  03/2000    Normal  . Abd [place]  07/2003    Negative  . Tear duct surgery  2005  .  Colonoscopy  12/2007    Adenomatous colon polyps  . Cardiac catheterization  04/07/2011    non obst CAD (Dr Burt Knack)  . Tee without cardioversion N/A 02/16/2013    Procedure: TRANSESOPHAGEAL ECHOCARDIOGRAM (TEE);  Surgeon: Josue Hector, MD;  Location: [facility] ENDOSCOPY;  Service: Cardiovascular;  Laterality: N/A;  . Loop recorder implant    . Loop recorder implant N/A 02/16/2013    Procedure: LOOP RECORDER IMPLANT;  Surgeon: Deboraha Sprang, MD;  Location: [facility] CATH LAB;  Service: Cardiovascular;  Laterality: N/A;   History  Substance Use Topics  . Smoking status: Never Smoker   . Smokeless tobacco: Never Used  . Alcohol Use: No   Family History  Problem Relation Age of Onset  . Lung cancer Brother   . Colon cancer Neg Hx   . Skin cancer Daughter   . Diabetes Sister   . Diabetes Brother   . Heart disease Mother   . Heart disease Father    Allergies  Allergen Reactions  . Bee Venom Hives, Shortness  Of Breath and Swelling  . Nabumetone Anaphylaxis  . Amoxicillin-Pot Clavulanate Hives and Swelling    To lips.  . Aspirin Hives  . Atorvastatin Swelling     joint pain/swelling, inc liver tests  . Clopidogrel Bisulfate Hives  . Codeine Nausea And Vomiting  . Ezetimibe Other (See Comments)     fatigue  . Metformin And Related Other (See Comments)    Diarrhea   . Valsartan Other (See Comments)     fatigue   Current Outpatient Prescriptions on File Prior to Visit  Medication Sig Dispense Refill  . CINNAMON PO Take 1 tablet by mouth daily.    . diphenhydrAMINE (BENADRYL) 25 MG tablet Take 50 mg by mouth every 8 (eight) hours as needed for allergies.     Marland Kitchen EPINEPHrine (EPI-PEN) 0.3 mg/0.3 mL DEVI Inject 0.3 mg into the muscle daily as needed (allergic reaction).     . fluticasone (FLONASE) 50 MCG/ACT nasal spray Place 2 sprays into both nostrils 2 (two) times daily.    Marland Kitchen GLIPIZIDE XL 5 MG 24 hr tablet TAKE 1 TABLET (5 MG TOTAL) BY MOUTH DAILY. 30 tablet 5  . olmesartan-hydrochlorothiazide (BENICAR HCT) 20-12.5 MG per tablet Take 1 tablet by mouth daily. 30 tablet 11  . ticlopidine (TICLID) 250 MG tablet Take 1 tablet (250 mg total) by mouth 2 (two) times daily with a meal. 180 tablet 0  . tobramycin-dexamethasone (TOBRADEX) ophthalmic solution Place 1 drop into both eyes daily as needed (dryness).     . [DISCONTINUED] levETIRAcetam (KEPPRA) 250 MG tablet Take 1 tablet (250 mg total) by mouth 2 (two) times daily. 60 tablet 0   No current facility-administered medications on file prior to visit.      Review of Systems Review of Systems  Constitutional: Negative for fever, appetite change, fatigue and unexpected weight change.  Eyes: Negative for pain and visual disturbance.  Respiratory: Negative for cough and shortness of breath.   Cardiovascular: Negative for cp or palpitations    Gastrointestinal: Negative for nausea, diarrhea and constipation.  Genitourinary: Negative for urgency  and frequency.  Skin: Negative for pallor or rash   Neurological: Negative for weakness, light-headedness, numbness and headaches.  Hematological: Negative for adenopathy. Does not bruise/bleed easily.  Psychiatric/Behavioral: Negative for dysphoric mood. The patient is not nervous/anxious.  pos for grief        Objective:   Physical Exam  Constitutional: She appears well-developed and well-nourished. No distress.  obese and well appearing   HENT:  Head: Normocephalic and atraumatic.  Mouth/Throat: Oropharynx is clear and moist.  Eyes: Conjunctivae and EOM are normal. Pupils are equal, round, and reactive to light. No scleral icterus.  Neck: Normal range of motion. Neck supple. Carotid bruit is not present.  Cardiovascular: Normal rate, regular rhythm and normal heart sounds.   Pulmonary/Chest: Effort normal and breath sounds normal. No respiratory distress. She has no wheezes. She has no rales.  Abdominal: Soft. Bowel sounds are normal. She exhibits no distension. There is no tenderness.  Musculoskeletal: She exhibits no edema.  Lymphadenopathy:    She has no cervical adenopathy.  Neurological: She is alert. She has normal reflexes. She exhibits normal muscle tone.  Skin: Skin is warm and dry. No rash noted. No erythema. No pallor.  Psychiatric: She has a normal mood and affect.          Assessment & Plan:   Problem List Items Addressed This Visit      Cardiovascular and Mediastinum   Essential hypertension - Primary    For price and also improved control will change to losartan hct and step up to 100-25 mg dose BP: (!) 144/60 mmHg     Rev potential side eff incl hypotension  Rev checking bp at home and correct way to do it - when relaxed F/u planned in May with labs prior  Rev lifestyle change for HTN incl DASH diet       Relevant Medications   losartan-hydrochlorothiazide (HYZAAR) 100-25 MG per tablet

## 2014-08-31 NOTE — Assessment & Plan Note (Signed)
For price and also improved control will change to losartan hct and step up to 100-25 mg dose BP: (!) 144/60 mmHg     Rev potential side eff incl hypotension  Rev checking bp at home and correct way to do it - when relaxed F/u planned in May with labs prior  Rev lifestyle change for HTN incl DASH diet

## 2014-09-05 LAB — MDC_IDC_ENUM_SESS_TYPE_REMOTE
Date Time Interrogation Session: 20160216211830
Zone Setting Detection Interval: 2000 ms
Zone Setting Detection Interval: 3000 ms
Zone Setting Detection Interval: 390 ms

## 2014-09-20 ENCOUNTER — Encounter: Payer: Self-pay | Admitting: Internal Medicine

## 2014-09-25 ENCOUNTER — Ambulatory Visit (INDEPENDENT_AMBULATORY_CARE_PROVIDER_SITE_OTHER): Payer: PPO | Admitting: *Deleted

## 2014-09-25 DIAGNOSIS — I6350 Cerebral infarction due to unspecified occlusion or stenosis of unspecified cerebral artery: Secondary | ICD-10-CM | POA: Diagnosis not present

## 2014-09-25 DIAGNOSIS — I639 Cerebral infarction, unspecified: Secondary | ICD-10-CM

## 2014-09-26 ENCOUNTER — Encounter: Payer: Self-pay | Admitting: Internal Medicine

## 2014-09-29 NOTE — Progress Notes (Signed)
Loop recorder 

## 2014-10-10 LAB — MDC_IDC_ENUM_SESS_TYPE_REMOTE
Date Time Interrogation Session: 20160302220808
Zone Setting Detection Interval: 2000 ms
Zone Setting Detection Interval: 3000 ms
Zone Setting Detection Interval: 390 ms

## 2014-10-25 ENCOUNTER — Ambulatory Visit (INDEPENDENT_AMBULATORY_CARE_PROVIDER_SITE_OTHER): Payer: PPO | Admitting: *Deleted

## 2014-10-25 DIAGNOSIS — I639 Cerebral infarction, unspecified: Secondary | ICD-10-CM | POA: Diagnosis not present

## 2014-10-25 NOTE — Progress Notes (Signed)
Loop recorder 

## 2014-11-01 ENCOUNTER — Encounter: Payer: Self-pay | Admitting: Neurology

## 2014-11-01 ENCOUNTER — Ambulatory Visit (INDEPENDENT_AMBULATORY_CARE_PROVIDER_SITE_OTHER): Payer: PPO | Admitting: Neurology

## 2014-11-01 VITALS — BP 135/58 | HR 66 | Wt 203.5 lb

## 2014-11-01 DIAGNOSIS — I63512 Cerebral infarction due to unspecified occlusion or stenosis of left middle cerebral artery: Secondary | ICD-10-CM | POA: Diagnosis not present

## 2014-11-01 NOTE — Patient Instructions (Signed)
I had a long d/w patient about her remote stroke, risk for recurrent stroke/TIAs, personally independently reviewed imaging studies and stroke evaluation results and answered questions.Continue Ticlidopine  for secondary stroke prevention and maintain strict control of hypertension with blood pressure goal below 130/90, diabetes with hemoglobin A1c goal below 6.5% and lipids with LDL cholesterol goal below 100 mg/dL. I also advised the patient to eat a healthy diet with plenty of whole grains, cereals, fruits and vegetables, exercise regularly and maintain ideal body weight. Check follow-up carotid and transcranial Doppler studies. Followup in the future with me in  6 months.

## 2014-11-01 NOTE — Progress Notes (Signed)
PATIENT: Margaret Vazquez DOB: September 08, 1935  REASON FOR VISIT: routine follow up for stroke HISTORY FROM: patient  HISTORY OF PRESENT ILLNESS: 05/11/13 (PS): Margaret Vazquez is a 33 year Caucasian lady who is seen for followup of recent hospital consults in August and October 2014. She was initially seen on 02/17/39 and when she was admitted for evaluation for multiple episodes of transient right arm numbness and tingling. Her symptoms got worse on 02/14/13 and she had difficulty eating with a fork as well as writing. MRI scan the brain showed multifocal infarcts in the left frontal and parietal MCA territory an MRI of the brain showed severe proximal left M1 stenosis. Transthoracic echocardiogram showed normal ejection fraction. Trans esophageal echocardiogram showed no cardiac source of embolism or PFO. Hemoglobin A1c was 6.5%. Total cholesterol was 172, triglycerides 161, HDL 41 and LDL 99 mg percent. She was started on aspirin and Plavix for stroke prevention and advised aggressive risk factor control. She also had a loop recorder implanted for atrial fibrillation detection. She subsequently returned on 04/25/39 and with transient episode of right upper extremity no weakness in addition to stiffening and twisting of her right arm and inability to control it. She was evaluated for possible sensory seizures with EEG, which was negative but she was started on low-dose Keppra 250 twice a day. She states she had trouble tolerating Keppra and developed itching as well as rash and was seen on the emergency room on 05/05/13 where Keppra was discontinued and she was switched to gabapentin. She always states that she is having trouble tolerating it and complains of sleepiness on it even though she is taking only 1 tablet daily. She had a CT head on 1019/14 which was stable. She complains of tiredness and decreased stamina. She's noticed that she still has some diminished fine motor skills in the right hand. She states her  blood pressure and sugars have been under good control. She remains on ticlopidine for secondary stroke prevention/she has an aspirin allergy and complained of feeling lethargic while taking Plavix in the past. She has seen me in the past for cervicogenic muscle tension headaches and tremors and was last in the office in 2013.   UPDATE 08/19/13 (LL): Margaret Vazquez returns for revisit, at last visit she discontinued Gabapentin and has no further episodes of her left arm "drawing up." She endorses severe fatigue, always feeling tired and sometimes feeling a pressure sensation between her shoulder blades which makes her feel like she needs to sit down. She has had no new neurovascular symptoms. She states her blood pressure and sugars have been under good control. She remains on ticlopidine for secondary stroke prevention.   UPDATE 02/22/14 (LL): Since last visit Margaret Vazquez was hospitalized in July for concern of stroke. She had 4 separate episodes of her left hand drawing up and decided to proceed to the emergency room. Episodes are similar to what she had in the right hand last year. MRI was negative for acute infarct. EEG was ordered in the hospital but not completed. She's had 2 similar episodes since leaving the hospital, on the left side also. Her blood pressure is been under good control it is 115/65 in the office today. She states that her cholesterol is under good control, and indeed it is below goal on no statin medication. She's tolerating Ticlid well without significant bleeding or bruising. She has no other complaints. Update 11/01/2014 : she returns for follow-up after last visit in August 2015. She  is doing better now and has not had any abnormal involuntary movements or seizure-like jerking activity. She was unable to tolerate Keppra and gabapentin in the past but these episodes. She had prolonged EEG done after last visit which have personally reviewed and showed no evidence of epileptiform activity.  She continues to have mild diminished fine motor skills in the right hand and some weakness but it is unchanged. Her sister had a stroke 3 weeks ago. She has some mild intermittent tingling in her feet likely from her diabetic neuropathy but this is not bothersome. She states her blood pressure is well controlled. Her fasting sugars usually range in the 110 range and last hemoglobin A1c is slightly elevated at 7.3. She plans to see her primary physician next week and will have lipid profile checked. She has a loop recorder in place and so far atrial fibrillation has not been discovered. She is tolerating Ticlid well without any side effects. She has not had any repeat Doppler studies done for the brain and neck in more than a year. REVIEW OF SYSTEMS: Full 14 system review of systems performed and notable only for: snoring, bruise easily and all other systems negative  ALLERGIES: Allergies  Allergen Reactions  . Bee Venom Hives, Shortness Of Breath and Swelling  . Nabumetone Anaphylaxis  . Amoxicillin-Pot Clavulanate Hives and Swelling    To lips.  . Aspirin Hives  . Atorvastatin Swelling     joint pain/swelling, inc liver tests  . Clopidogrel Bisulfate Hives  . Codeine Nausea And Vomiting  . Ezetimibe Other (See Comments)     fatigue  . Metformin And Related Other (See Comments)    Diarrhea   . Valsartan Other (See Comments)     fatigue    HOME MEDICATIONS: Outpatient Prescriptions Prior to Visit  Medication Sig Dispense Refill  . diphenhydrAMINE (BENADRYL) 25 MG tablet Take 50 mg by mouth every 8 (eight) hours as needed for allergies.     Marland Kitchen EPINEPHrine (EPI-PEN) 0.3 mg/0.3 mL DEVI Inject 0.3 mg into the muscle daily as needed (allergic reaction).     . fluticasone (FLONASE) 50 MCG/ACT nasal spray Place 2 sprays into both nostrils 2 (two) times daily.    Marland Kitchen GLIPIZIDE XL 5 MG 24 hr tablet TAKE 1 TABLET (5 MG TOTAL) BY MOUTH DAILY. 30 tablet 5  . losartan-hydrochlorothiazide (HYZAAR)  100-25 MG per tablet Take 1 tablet by mouth daily. 30 tablet 11  . ticlopidine (TICLID) 250 MG tablet Take 1 tablet (250 mg total) by mouth 2 (two) times daily with a meal. 180 tablet 0  . tobramycin-dexamethasone (TOBRADEX) ophthalmic solution Place 1 drop into both eyes daily as needed (dryness).     . CINNAMON PO Take 1 tablet by mouth daily.     No facility-administered medications prior to visit.    PHYSICAL EXAM Filed Vitals:   11/01/14 1025  BP: 135/58  Pulse: 66  Weight: 203 lb 8 oz (92.307 kg)   Body mass index is 30.04 kg/(m^2).  Physical Exam  General: well developed, well nourished elderly Caucasian lady, seated, in no evident distress  Head: head normocephalic and atraumatic.   Neck: supple with no carotid or supraclavicular bruits  Cardiovascular: regular rate and rhythm, no murmurs  Musculoskeletal: no deformity  Skin: no rash/petichiae  Vascular: Normal pulses all extremities   Neurologic Exam  Mental Status: Awake and fully alert. Oriented to place and time. Recent and remote memory intact. Attention span, concentration and fund of knowledge appropriate. Mood  and affect appropriate.  Cranial Nerves: Fundoscopic exam not done. Pupils equal, briskly reactive to light. Extraocular movements full without nystagmus. Visual fields full to confrontation. Hearing intact. Facial sensation intact. Face, tongue, palate moves normally and symmetrically.  Motor: Normal bulk and tone. Normal strength in all tested extremity muscles. Diminished fine finger movements on the right. Orbits left-to-right upper extremity. Minimal weakness of right grip muscles only.  Sensory: intact to touch and pinprick and vibratory sensation. But has subjective paresthesias in the right hand  Coordination: Rapid alternating movements normal in all extremities. Finger-to-nose and heel-to-shin performed accurately bilaterally.  Gait and Station: Arises from chair without difficulty. Stance is normal.  Gait demonstrates normal stride length and balance . Not able to heel, toe and tandem walk without difficulty.  Reflexes: 1+ and symmetric. Toes downgoing.   DIAGNOSTIC DATA (LABS, IMAGING, TESTING) - I reviewed patient records, labs, notes, testing and imaging myself where available.  Lab Results  Component Value Date   WBC 6.9 06/23/2014   HGB 13.1 06/23/2014   HCT 39.2 06/23/2014   MCV 93.6 06/23/2014   PLT 224 06/23/2014      Component Value Date/Time   NA 139 08/14/2014 0803   NA 144 08/29/2011 2307   K 4.0 08/14/2014 0803   K 3.6 08/29/2011 2307   CL 105 08/14/2014 0803   CL 103 08/29/2011 2307   CO2 30 08/14/2014 0803   CO2 26 08/29/2011 2307   GLUCOSE 123* 08/14/2014 0803   GLUCOSE 162* 08/29/2011 2307   BUN 19 08/14/2014 0803   BUN 14 08/29/2011 2307   CREATININE 1.04 08/14/2014 0803   CREATININE 0.98 01/27/2014 1448   CREATININE 1.03 08/29/2011 2307   CALCIUM 9.0 08/14/2014 0803   CALCIUM 8.9 08/29/2011 2307   PROT 7.0 08/14/2014 0803   ALBUMIN 3.9 08/14/2014 0803   AST 15 08/14/2014 0803   ALT 14 08/14/2014 0803   ALKPHOS 83 08/14/2014 0803   BILITOT 0.3 08/14/2014 0803   GFRNONAA 56* 06/23/2014 1904   GFRAA 65* 06/23/2014 1904   Lab Results  Component Value Date   CHOL 154 01/10/2014   HDL 72 01/10/2014   LDLCALC 62 01/10/2014   TRIG 101 01/10/2014   CHOLHDL 2.1 01/10/2014   Lab Results  Component Value Date   HGBA1C 6.9* 08/14/2014   Lab Results  Component Value Date   TSH 3.470 01/11/2014    ASSESSMENT: 81 year Caucasian lady with left middle cerebral artery branch infarcts in August 2014 secondary to severe proximal left M1 stenosis who has had recurrent episodes of right arm paresthesias with one episode in October 2014 with involuntary twisting and lack of control and shaking of the hand raising concern for simple partial seizure versus myoclonus. She was started on Keppra but did not tolerate it well due to hives and itching and was changed to  gabapentin which also she had trouble tolerating due to sleepiness.  She has done well of late without any episodes PLAN:    I had a long d/w patient about her remote stroke, risk for recurrent stroke/TIAs, personally independently reviewed imaging studies and stroke evaluation results and answered questions.Continue Ticlidopine  for secondary stroke prevention and maintain strict control of hypertension with blood pressure goal below 130/90, diabetes with hemoglobin A1c goal below 6.5% and lipids with LDL cholesterol goal below 100 mg/dL. I also advised the patient to eat a healthy diet with plenty of whole grains, cereals, fruits and vegetables, exercise regularly and maintain ideal body weight.  Check follow-up carotid and transcranial Doppler studies. Followup in the future with me in  6 months.  Orders Placed This Encounter  Procedures  . US Carotid Bilateral  . [place] TCD COMPLETE      Antony Contras, MD  11/01/2014, 11:21 AM [facility] [address] [place], [place] [address] [phone]  Note: This document was prepared with digital dictation and possible smart phrase technology. Any transcriptional errors that result from this process are unintentional.

## 2014-11-03 ENCOUNTER — Other Ambulatory Visit: Payer: Self-pay | Admitting: Family Medicine

## 2014-11-06 ENCOUNTER — Encounter: Payer: Self-pay | Admitting: Internal Medicine

## 2014-11-15 ENCOUNTER — Other Ambulatory Visit (INDEPENDENT_AMBULATORY_CARE_PROVIDER_SITE_OTHER): Payer: PPO

## 2014-11-15 DIAGNOSIS — I10 Essential (primary) hypertension: Secondary | ICD-10-CM

## 2014-11-15 DIAGNOSIS — E1151 Type 2 diabetes mellitus with diabetic peripheral angiopathy without gangrene: Secondary | ICD-10-CM | POA: Diagnosis not present

## 2014-11-15 DIAGNOSIS — E1159 Type 2 diabetes mellitus with other circulatory complications: Secondary | ICD-10-CM

## 2014-11-15 LAB — COMPREHENSIVE METABOLIC PANEL
ALT: 13 U/L (ref 0–35)
AST: 15 U/L (ref 0–37)
Albumin: 3.6 g/dL (ref 3.5–5.2)
Alkaline Phosphatase: 81 U/L (ref 39–117)
BUN: 17 mg/dL (ref 6–23)
CO2: 30 mEq/L (ref 19–32)
Calcium: 8.9 mg/dL (ref 8.4–10.5)
Chloride: 104 mEq/L (ref 96–112)
Creatinine, Ser: 0.96 mg/dL (ref 0.40–1.20)
GFR: 59.88 mL/min — ABNORMAL LOW (ref >60.00)
Glucose, Bld: 140 mg/dL — ABNORMAL HIGH (ref 70–99)
Potassium: 3.9 mEq/L (ref 3.5–5.1)
Sodium: 138 mEq/L (ref 135–145)
Total Bilirubin: 0.4 mg/dL (ref 0.2–1.2)
Total Protein: 6.7 g/dL (ref 6.0–8.3)

## 2014-11-15 LAB — LIPID PANEL
Cholesterol: 203 mg/dL — ABNORMAL HIGH (ref 0–200)
HDL: 46.8 mg/dL (ref >39.00)
LDL Cholesterol: 125 mg/dL — ABNORMAL HIGH (ref 0–99)
NonHDL: 156.2
Total CHOL/HDL Ratio: 4
Triglycerides: 157 mg/dL — ABNORMAL HIGH (ref 0.0–149.0)
VLDL: 31.4 mg/dL (ref 0.0–40.0)

## 2014-11-15 LAB — HEMOGLOBIN A1C: Hgb A1c MFr Bld: 7 % — ABNORMAL HIGH (ref 4.6–6.5)

## 2014-11-20 ENCOUNTER — Other Ambulatory Visit: Payer: PPO

## 2014-11-21 LAB — CUP PACEART REMOTE DEVICE CHECK: Date Time Interrogation Session: 20160517164752

## 2014-11-22 ENCOUNTER — Ambulatory Visit (INDEPENDENT_AMBULATORY_CARE_PROVIDER_SITE_OTHER): Payer: PPO | Admitting: Family Medicine

## 2014-11-22 ENCOUNTER — Encounter: Payer: Self-pay | Admitting: Family Medicine

## 2014-11-22 VITALS — BP 112/62 | HR 64 | Temp 98.4°F | Ht 69.0 in | Wt 202.8 lb

## 2014-11-22 DIAGNOSIS — R0789 Other chest pain: Secondary | ICD-10-CM | POA: Diagnosis not present

## 2014-11-22 DIAGNOSIS — E1151 Type 2 diabetes mellitus with diabetic peripheral angiopathy without gangrene: Secondary | ICD-10-CM | POA: Diagnosis not present

## 2014-11-22 DIAGNOSIS — E119 Type 2 diabetes mellitus without complications: Secondary | ICD-10-CM

## 2014-11-22 DIAGNOSIS — E785 Hyperlipidemia, unspecified: Secondary | ICD-10-CM

## 2014-11-22 DIAGNOSIS — I10 Essential (primary) hypertension: Secondary | ICD-10-CM

## 2014-11-22 DIAGNOSIS — E1159 Type 2 diabetes mellitus with other circulatory complications: Secondary | ICD-10-CM

## 2014-11-22 MED ORDER — GLIPIZIDE ER 10 MG PO TB24: 10.0000 mg | ORAL_TABLET | Freq: Every day | ORAL | Status: DC

## 2014-11-22 NOTE — Progress Notes (Signed)
Pre visit review using our clinic review tool, if applicable. No additional management support is needed unless otherwise documented below in the visit note. 

## 2014-11-22 NOTE — Patient Instructions (Signed)
Try increasing the glipizide ER from 5 to 10 mg daily (take 2 of the 5 mg tabs once daily) If blood sugar is too low-go back to 5 mg and let me know  Eat a diabetic diet  Stop at check out for referral to cardiology for the chest symptoms you have ( if your chest pressure re occurs go to the ER) Get your eye doctor appt as planned  Blood pressure is great  Cholesterol is up/ I know you cannot take statins (Avoid red meat/ fried foods/ egg yolks/ fatty breakfast meats/ butter, cheese and high fat dairy/ and shellfish )  Stop at check out for referral to cardiology

## 2014-11-22 NOTE — Progress Notes (Signed)
Subjective:    Patient ID: Margaret Vazquez, female    DOB: Apr 23, 1936, 79 y.o.   MRN: [id number]  HPI Here for f/u of chronic health problems   Wt is down 1 lb with bmi of 29  bp is improved today  No cp or palpitations or headaches or edema  No side effects to medicines - did change from benicar to losartan   BP Readings from Last 3 Encounters:  11/22/14 112/62  11/01/14 135/58  08/30/14 144/60     Hyperlipidemia Intol to atorvastatin  Lab Results  Component Value Date   CHOL 203* 11/15/2014   CHOL 154 01/10/2014   CHOL 184 04/25/2013   Lab Results  Component Value Date   HDL 46.80 11/15/2014   HDL 72 01/10/2014   HDL 45 04/25/2013   Lab Results  Component Value Date   LDLCALC 125* 11/15/2014   LDLCALC 62 01/10/2014   [place] 95 04/25/2013   Lab Results  Component Value Date   TRIG 157.0* 11/15/2014   TRIG 101 01/10/2014   TRIG 222* 04/25/2013   Lab Results  Component Value Date   CHOLHDL 4 11/15/2014   CHOLHDL 2.1 01/10/2014   CHOLHDL 4.1 04/25/2013   Lab Results  Component Value Date   LDLDIRECT 136.5 10/20/2011   LDLDIRECT 134.7 08/08/2008   LDLDIRECT 143.0 04/27/2007   does not tolerate all statins  Off statin - inc LDL and HDL is down  Avoid red meat and fried foods  occ fried chicken - once per week   Diabetes Home sugar results - her blood sugars higher in the am - 150s and pm 160-180, no low blood sugars  DM diet -no changes overall /cut back on portions a bit  Exercise -not much  Symptoms A1C last  Lab Results  Component Value Date   HGBA1C 7.0* 11/15/2014  was 6.9   No problems with medications -glipizide er 5 (cannot tol metformin) Renal protection ARB Last eye exam -upcoming June 1  Gets tightness in chest with exercise -mid chest -lasts a few seconds  No hx of heart problems No sob  -     Patient Active Problem List   Diagnosis Date Noted  . Obesity 08/23/2014  . Type 2 diabetes mellitus with vascular disease  01/12/2014  . TIA (transient ischemic attack) 01/11/2014  . CVA (cerebral infarction) 01/11/2014  . Sensory disturbance 01/11/2014  . Pain of right thumb 11/14/2013  . Left hip pain 11/14/2013  . Chest pain at rest 08/31/2013  . LOOP Recorder LINQ 06/07/2013  . Fatigue 06/07/2013  . Sinusitis, chronic 05/16/2013  . Acute ischemic stroke 02/15/2013  . Numbness and tingling of right arm 02/14/2013  . Encounter for Medicare annual wellness exam 10/27/2012  . Encopresis(307.7) 12/15/2011  . Post-menopausal 10/28/2011  . PERSONAL HX COLONIC POLYPS 09/04/2009  . VITAMIN B12 DEFICIENCY 03/06/2009  . UNSPECIFIED ANEMIA 02/13/2009  . ALLERGIC RHINITIS 02/14/2008  . BACK PAIN, LUMBAR 11/03/2007  . Hyperlipidemia 11/24/2006  . Essential hypertension 11/24/2006  . Diabetes type 2, controlled 10/09/2006  . GERD 10/09/2006  . OVERACTIVE BLADDER 10/09/2006  . INCONTINENCE, URGE 10/09/2006  . SKIN CANCER, HX OF 10/09/2006   Past Medical History  Diagnosis Date  . Hypertension   . Diabetes mellitus     type II  . Hyperlipidemia     myalgias with Lipitor and Zetia  . GERD (gastroesophageal reflux disease)   . Skin cancer     hx of basal cell/ak's  . Colon  polyps 2009  . Allergy history, drug     Aspirin  . Cervical stenosis of spine     With neck pain  . CAD (coronary artery disease) 04/08/11    non obst by cath  . Migraine   . TIA (transient ischemic attack)   . Stroke August 2014   Past Surgical History  Procedure Laterality Date  . Appendectomy    . Tubal ligation      BTL  . Breast surgery  1992    breast biopsy  . Eye surgery  2005    tear duct surgery  . Nasal sinus surgery  01/2005  . Cardiac catheterization  1993    LHC: normal coronaries.   . Stress cardiolite  11/1999    Normal/ negative  . Cystoscopy w/ decannulation  03/2000    Normal  . Abd [place]  07/2003    Negative  . Tear duct surgery  2005  . Colonoscopy  12/2007    Adenomatous colon polyps  . Cardiac  catheterization  04/07/2011    non obst CAD (Dr Burt Knack)  . Tee without cardioversion N/A 02/16/2013    Procedure: TRANSESOPHAGEAL ECHOCARDIOGRAM (TEE);  Surgeon: Josue Hector, MD;  Location: [facility] ENDOSCOPY;  Service: Cardiovascular;  Laterality: N/A;  . Loop recorder implant    . Loop recorder implant N/A 02/16/2013    Procedure: LOOP RECORDER IMPLANT;  Surgeon: Deboraha Sprang, MD;  Location[facility] CATH LAB;  Service: Cardiovascular;  Laterality: N/A;   History  Substance Use Topics  . Smoking status: Never Smoker   . Smokeless tobacco: Never Used  . Alcohol Use: No   Family History  Problem Relation Age of Onset  . Lung cancer Brother   . Colon cancer Neg Hx   . Skin cancer Daughter   . Diabetes Sister   . Diabetes Brother   . Pancreatic cancer Brother   . Heart disease Mother   . Heart disease Father   . Brain cancer Other    Allergies  Allergen Reactions  . Bee Venom Hives, Shortness Of Breath and Swelling  . Nabumetone Anaphylaxis  . Amoxicillin-Pot Clavulanate Hives and Swelling    To lips.  . Aspirin Hives  . Atorvastatin Swelling     joint pain/swelling, inc liver tests  . Clopidogrel Bisulfate Hives  . Codeine Nausea And Vomiting  . Ezetimibe Other (See Comments)     fatigue  . Metformin And Related Other (See Comments)    Diarrhea   . Valsartan Other (See Comments)     fatigue   Current Outpatient Prescriptions on File Prior to Visit  Medication Sig Dispense Refill  . diphenhydrAMINE (BENADRYL) 25 MG tablet Take 50 mg by mouth every 8 (eight) hours as needed for allergies.     Marland Kitchen EPINEPHrine (EPI-PEN) 0.3 mg/0.3 mL DEVI Inject 0.3 mg into the muscle daily as needed (allergic reaction).     . fluticasone (FLONASE) 50 MCG/ACT nasal spray Place 2 sprays into both nostrils 2 (two) times daily.    Marland Kitchen GLIPIZIDE XL 5 MG 24 hr tablet TAKE ONE TABLET BY MOUTH EVERY DAY 30 tablet 4  . losartan-hydrochlorothiazide (HYZAAR) 100-25 MG per tablet Take 1 tablet by mouth daily. 30  tablet 11  . ticlopidine (TICLID) 250 MG tablet Take 1 tablet (250 mg total) by mouth 2 (two) times daily with a meal. 180 tablet 0  . tobramycin-dexamethasone (TOBRADEX) ophthalmic solution Place 1 drop into both eyes daily as needed (dryness).     . [  DISCONTINUED] levETIRAcetam (KEPPRA) 250 MG tablet Take 1 tablet (250 mg total) by mouth 2 (two) times daily. 60 tablet 0   No current facility-administered medications on file prior to visit.    Review of Systems Review of Systems  Constitutional: Negative for fever, appetite change, fatigue and unexpected weight change.  Eyes: Negative for pain and visual disturbance.  Respiratory: Negative for cough and shortness of breath.   Cardiovascular: Negative for  Palpitations, PND, orthopnea, or pedal edema , pos for calf pain at times     Gastrointestinal: Negative for nausea, diarrhea and constipation.  Genitourinary: Negative for urgency and frequency.  Skin: Negative for pallor or rash   Neurological: Negative for weakness, light-headedness, numbness and headaches.  Hematological: Negative for adenopathy. Does not bruise/bleed easily.  Psychiatric/Behavioral: Negative for dysphoric mood. The patient is not nervous/anxious.         Objective:   Physical Exam  Constitutional: She appears well-developed and well-nourished. No distress.  obese and well appearing   HENT:  Head: Normocephalic and atraumatic.  Mouth/Throat: Oropharynx is clear and moist.  Eyes: Conjunctivae and EOM are normal. Pupils are equal, round, and reactive to light.  Neck: Normal range of motion. Neck supple. No JVD present. Carotid bruit is not present. No thyromegaly present.  Cardiovascular: Normal rate, regular rhythm and normal heart sounds.  Exam reveals no gallop.   Pulses are difficult to palpate in feet today  Pulmonary/Chest: Effort normal and breath sounds normal. No respiratory distress. She has no wheezes. She has no rales. She exhibits no tenderness.  No  crackles  Abdominal: Soft. Bowel sounds are normal. She exhibits no distension, no abdominal bruit and no mass. There is no tenderness.  Musculoskeletal: She exhibits no edema or tenderness.  No palp cords or erythema in legs   Lymphadenopathy:    She has no cervical adenopathy.  Neurological: She is alert. She has normal reflexes. No cranial nerve deficit. She exhibits normal muscle tone. Coordination normal.  Skin: Skin is warm and dry. No rash noted. No pallor.  Psychiatric: She has a normal mood and affect.          Assessment & Plan:   Problem List Items Addressed This Visit    Chest discomfort    Exertional in pt with vascular risk factors and CVA who is intol of statins  Reassuring EKG Ref to cardiology for further eval       Relevant Orders   EKG 12-Lead (Completed)   Ambulatory referral to Cardiology   Diabetes type 2, controlled    Lab Results  Component Value Date   HGBA1C 7.0* 11/15/2014  Trial of glipizide er 10 as tolerated watching closely for hypoglycemia Rev low glycemic diet       Relevant Medications   glipiZIDE (GLUCOTROL XL) 10 MG 24 hr tablet   Essential hypertension    bp in fair control at this time  BP Readings from Last 1 Encounters:  11/22/14 112/62   No changes needed Disc lifstyle change with low sodium diet and exercise   Labs reviewed       Hyperlipidemia    Disc goals for lipids and reasons to control them Rev labs with pt Rev low sat fat diet in detail Not at goal but intolerant of statins        Type 2 diabetes mellitus with vascular disease - Primary    Lab Results  Component Value Date   HGBA1C 7.0* 11/15/2014   Not at goal Will  inc glipizide ER to 10 mg as tol (watching closely for hypoglycemia) Rev low glycemic diet       Relevant Medications   glipiZIDE (GLUCOTROL XL) 10 MG 24 hr tablet

## 2014-11-23 NOTE — Assessment & Plan Note (Signed)
Lab Results  Component Value Date   HGBA1C 7.0* 11/15/2014   Not at goal Will inc glipizide ER to 10 mg as tol (watching closely for hypoglycemia) Rev low glycemic diet

## 2014-11-23 NOTE — Assessment & Plan Note (Signed)
Exertional in pt with vascular risk factors and CVA who is intol of statins  Reassuring EKG Ref to cardiology for further eval

## 2014-11-23 NOTE — Assessment & Plan Note (Signed)
Lab Results  Component Value Date   HGBA1C 7.0* 11/15/2014  Trial of glipizide er 10 as tolerated watching closely for hypoglycemia Rev low glycemic diet

## 2014-11-23 NOTE — Assessment & Plan Note (Signed)
bp in fair control at this time  BP Readings from Last 1 Encounters:  11/22/14 112/62   No changes needed Disc lifstyle change with low sodium diet and exercise   Labs reviewed

## 2014-11-23 NOTE — Assessment & Plan Note (Signed)
Disc goals for lipids and reasons to control them Rev labs with pt Rev low sat fat diet in detail Not at goal but intolerant of statins

## 2014-11-24 ENCOUNTER — Ambulatory Visit (INDEPENDENT_AMBULATORY_CARE_PROVIDER_SITE_OTHER): Payer: PPO | Admitting: *Deleted

## 2014-11-24 DIAGNOSIS — I639 Cerebral infarction, unspecified: Secondary | ICD-10-CM | POA: Diagnosis not present

## 2014-11-24 NOTE — Progress Notes (Signed)
Loop recorder 

## 2014-11-27 ENCOUNTER — Encounter: Payer: Self-pay | Admitting: Physician Assistant

## 2014-11-27 ENCOUNTER — Ambulatory Visit (INDEPENDENT_AMBULATORY_CARE_PROVIDER_SITE_OTHER): Payer: PPO | Admitting: Physician Assistant

## 2014-11-27 VITALS — BP 130/60 | HR 59 | Ht 69.0 in | Wt 204.0 lb

## 2014-11-27 DIAGNOSIS — R0789 Other chest pain: Secondary | ICD-10-CM | POA: Diagnosis not present

## 2014-11-27 DIAGNOSIS — I10 Essential (primary) hypertension: Secondary | ICD-10-CM | POA: Diagnosis not present

## 2014-11-27 DIAGNOSIS — E1151 Type 2 diabetes mellitus with diabetic peripheral angiopathy without gangrene: Secondary | ICD-10-CM | POA: Diagnosis not present

## 2014-11-27 DIAGNOSIS — E1159 Type 2 diabetes mellitus with other circulatory complications: Secondary | ICD-10-CM

## 2014-11-27 MED ORDER — ISOSORBIDE MONONITRATE ER 30 MG PO TB24: 15.0000 mg | ORAL_TABLET | Freq: Every day | ORAL | Status: DC

## 2014-11-27 NOTE — Assessment & Plan Note (Signed)
So far no evidence of atrial fibrillation or arrhythmia and device check.

## 2014-11-27 NOTE — Assessment & Plan Note (Addendum)
Blood pressure controlled. 

## 2014-11-27 NOTE — Assessment & Plan Note (Signed)
Hemoglobin A1c 7. Managed by Dr. Glori Bickers

## 2014-11-27 NOTE — Assessment & Plan Note (Addendum)
Patient complains of several month history of exertional chest tightness. Worse episode was Saturday evening after coming up the stairs and radiated to her arms. This lasted only 5 seconds. She has history of normal cardiac catheterization 2012 but has multiple cardiac risk factors including hypertension, hyperlipidemia that untreated due to intolerance to medications, diabetes mellitus, and strong family history of CAD and CVA. EKG without acute change. Recommend exercise Myoview to rule out ischemia. Patient would like to try walking on the treadmill but will need Lexi scan back up. Add low-dose Imdur 15 mg once daily.

## 2014-11-27 NOTE — Assessment & Plan Note (Signed)
Intolerant to statins. Lipid profile last week cholesterol 203 triglycerides 157 LDL 125. Low fat diet.

## 2014-11-27 NOTE — Patient Instructions (Addendum)
Medication Instructions:  START  TAKING IMDUR 15 MG  OF 30 MG   Labwork:  NONE TODAY   Testing/Procedures:  Your physician has requested that you have en exercise stress myoview. For further information please visit [email or web address]. Please follow instruction sheet, as given.   Follow-Up:  WITH DR Caryl Comes IN 2 MONTHS   Any Other Special Instructions Will Be Listed Below (If Applicable).

## 2014-11-27 NOTE — Progress Notes (Signed)
Cardiology Office Note   Date:  11/27/2014   ID:  Margaret Vazquez, DOB [birth year], MRN [id number]  PCP:  Loura Pardon, MD  Cardiologist:  Dr. Jolyn Nap  Chief Complaint:chest pain    History of Present Illness: Margaret Vazquez is a 79 y.o. female who presents for evaluation of chest pain. She has nonobstructive CAD by cath in 2012. She has hypertension, diabetes mellitus type 2, hyperlipidemia and recently had a CVA. She had a loop recorder implanted for cryptogenic stroke. She is on chronic Ticlid due to aspirin and Plavix allergy. So far no arrhythmias or A. fib has been noted. Follow-up with Dr. Caryl Comes in September. Cholesterol 203 triglycerides 157 LDL 125, hemoglobin A1c 7 Nov 15 2014.  Patient comes in today complaining of several month history of exertional chest tightness. She says if she walks any distance or goes up and down the stairs to do her laundry she gets tight in her chest. Saturday night when she came up from her basement she developed chest tightness that radiated into the both arms for the first time. This lasted for 5 seconds and eased spontaneously. The patient denies any dyspnea or dyspnea on exertion, dizziness or presyncope. Symptoms are about the same as they were several months ago and have not progressed. She is not exercising because of this. It does not occur every day, but she is limiting her activity because of it. She had a mild episode when she walked in to church yesterday. She does not take anything for her elevated lipids because she is allergic to everything. She has a strong family history of CAD with her mother dying at 3, sister with heart disease, brother with CABG.    Past Medical History  Diagnosis Date  . Hypertension   . Diabetes mellitus     type II  . Hyperlipidemia     myalgias with Lipitor and Zetia  . GERD (gastroesophageal reflux disease)   . Skin cancer     hx of basal cell/ak's  . Colon polyps 2009  . Allergy history, drug     Aspirin   . Cervical stenosis of spine     With neck pain  . CAD (coronary artery disease) 04/08/11    non obst by cath  . Migraine   . TIA (transient ischemic attack)   . Stroke August 2014    Past Surgical History  Procedure Laterality Date  . Appendectomy    . Tubal ligation      BTL  . Breast surgery  1992    breast biopsy  . Eye surgery  2005    tear duct surgery  . Nasal sinus surgery  01/2005  . Cardiac catheterization  1993    LHC: normal coronaries.   . Stress cardiolite  11/1999    Normal/ negative  . Cystoscopy w/ decannulation  03/2000    Normal  . Abd [place]  07/2003    Negative  . Tear duct surgery  2005  . Colonoscopy  12/2007    Adenomatous colon polyps  . Cardiac catheterization  04/07/2011    non obst CAD (Dr Burt Knack)  . Tee without cardioversion N/A 02/16/2013    Procedure: TRANSESOPHAGEAL ECHOCARDIOGRAM (TEE);  Surgeon: Josue Hector, MD;  Location: [facility] ENDOSCOPY;  Service: Cardiovascular;  Laterality: N/A;  . Loop recorder implant    . Loop recorder implant N/A 02/16/2013    Procedure: LOOP RECORDER IMPLANT;  Surgeon: Deboraha Sprang, MD;  Location: [facility] CATH LAB;  Service: Cardiovascular;  Laterality: N/A;     Current Outpatient Prescriptions  Medication Sig Dispense Refill  . diphenhydrAMINE (BENADRYL) 25 MG tablet Take 50 mg by mouth every 8 (eight) hours as needed for allergies.     Marland Kitchen EPINEPHrine (EPI-PEN) 0.3 mg/0.3 mL DEVI Inject 0.3 mg into the muscle daily as needed (allergic reaction).     . fluticasone (FLONASE) 50 MCG/ACT nasal spray Place 2 sprays into both nostrils 2 (two) times daily.    Marland Kitchen glipiZIDE (GLUCOTROL XL) 10 MG 24 hr tablet Take 1 tablet (10 mg total) by mouth daily with breakfast. 30 tablet 11  . losartan-hydrochlorothiazide (HYZAAR) 100-25 MG per tablet Take 1 tablet by mouth daily. 30 tablet 11  . ticlopidine (TICLID) 250 MG tablet Take 1 tablet (250 mg total) by mouth 2 (two) times daily with a meal. 180 tablet 0  . tobramycin-dexamethasone  (TOBRADEX) ophthalmic solution Place 1 drop into both eyes daily as needed (dryness).     . [DISCONTINUED] levETIRAcetam (KEPPRA) 250 MG tablet Take 1 tablet (250 mg total) by mouth 2 (two) times daily. 60 tablet 0   No current facility-administered medications for this visit.    Allergies:   Bee venom; Nabumetone; Amoxicillin-pot clavulanate; Aspirin; Atorvastatin; Clopidogrel bisulfate; Codeine; Ezetimibe; Metformin and related; and Valsartan    Social History:  The patient  reports that she has never smoked. She has never used smokeless tobacco. She reports that she does not drink alcohol or use illicit drugs.   Family History:  The patient's    family history includes Brain cancer in her other; Diabetes in her brother and sister; Heart disease in her father and mother; Lung cancer in her brother; Pancreatic cancer in her brother; Skin cancer in her daughter. There is no history of Colon cancer.    ROS:  Please see the history of present illness.   Otherwise, review of systems are positive for none.   All other systems are reviewed and negative.    PHYSICAL EXAM: VS:  BP 130/60 mmHg  Pulse 59  Ht 5' 9"$  (1.753 m)  Wt 204 lb (92.534 kg)  BMI 30.11 kg/m2 , BMI Body mass index is 30.11 kg/(m^2). GEN: Well nourished, well developed, in no acute distress Neck: no JVD, HJR, carotid bruits, or masses Cardiac: RRR; positive S4, no murmurs, rubs, thrill or heave,  Respiratory:  Decreased breath sounds with fine crackles at the bases otherwise clear to auscultation bilaterally, normal work of breathing GI: soft, nontender, nondistended, + BS MS: no deformity or atrophy Extremities: without cyanosis, clubbing, edema, good distal pulses bilaterally.  Skin: warm and dry, no rash Neuro:  Strength and sensation are intact    EKG:  EKG is ordered today. The ekg ordered today demonstrates sinus bradycardia at 56 bpm otherwise normal.   Recent Labs: 01/11/2014: TSH 3.470 06/23/2014: Hemoglobin  13.1; Platelets 224 11/15/2014: ALT 13; BUN 17; Creatinine 0.96; Potassium 3.9; Sodium 138    Lipid Panel    Component Value Date/Time   CHOL 203* 11/15/2014 0811   TRIG 157.0* 11/15/2014 0811   HDL 46.80 11/15/2014 0811   CHOLHDL 4 11/15/2014 0811   VLDL 31.4 11/15/2014 0811   LDLCALC 125* 11/15/2014 0811   LDLDIRECT 136.5 10/20/2011 1054      Wt Readings from Last 3 Encounters:  11/27/14 204 lb (92.534 kg)  11/22/14 202 lb 12 oz (91.967 kg)  11/01/14 203 lb 8 oz (92.307 kg)      Other studies Reviewed: Additional studies/ records  that were reviewed today include and review of the records demonstrates:  2012 CORONARY ANGIOGRAPHY:  The left main is patent.  There is no obstructive disease.  It divides into the LAD and left circumflex.   LAD:  The LAD has mild nonobstructive stenosis throughout the proximal vessel.  There is 20-30% luminal narrowing.  The vessel is essentially widely patent and gives off a tiny first diagonal and a moderate caliber second diagonal branch.   LEFT CIRCUMFLEX:  There is a small to moderate caliber intermediate branch with no significant stenosis.  The AV groove circumflex has a 50% mid stenosis.  It supplies a single OM branch.   RIGHT CORONARY ARTERY:  This is a large, dominant vessel.  There is no obstructive disease throughout the proximal mid or distal RCA.  There are luminal irregularities with up to 30% stenosis.  The vessel gives off a large PDA branch and 3 posterolateral branches.  There is mild distal vessel disease noted, but there are no areas of critical stenosis anywhere.   FINAL ASSESSMENT:  Mild diffuse nonobstructive coronary artery disease as described.   RECOMMENDATIONS:  The patient should be treated with medical therapy with focus on control of her blood pressure.  She is allergic to both aspirin and Plavix and antiplatelet therapy is probably not necessary.   2-D echo 01/2014   Study Conclusions  - Left  ventricle: The cavity size was normal. Wall thickness was   normal. Systolic function was normal. The estimated ejection   fraction was in the range of 60% to 65%. Wall motion was normal;   there were no regional wall motion abnormalities. Doppler   parameters are consistent with abnormal left ventricular   relaxation (grade 1 diastolic dysfunction). The E/e&' ratio is   between 8-15, suggesting indeterminate LV Filling pressure. - Aortic valve: Trileaflet. Sclerosis without stenosis. There was   no regurgitation. - Left atrium: Moderately dilated (40 ml/m2).  Impressions:  - No significant change compared to echo in 04/2013.    ASSESSMENT AND PLAN:  Chest discomfort Patient complains of several month history of exertional chest tightness. Worse episode was Saturday evening after coming up the stairs and radiated to her arms. This lasted only 5 seconds. She has history of normal cardiac catheterization 2012 but has multiple cardiac risk factors including hypertension, hyperlipidemia that untreated due to intolerance to medications, diabetes mellitus, and strong family history of CAD and CVA. EKG without acute change. Recommend exercise Myoview to rule out ischemia. Patient would like to try walking on the treadmill but will need Lexi scan back up. Add low-dose Imdur 15 mg once daily.   Type 2 diabetes mellitus with vascular disease Hemoglobin A1c 7. Managed by Dr. Glori Bickers   LOOP Recorder LINQ So far no evidence of atrial fibrillation or arrhythmia and device check.   Essential hypertension Blood pressure controlled.   Hyperlipidemia Intolerant to statins. Lipid profile last week cholesterol 203 triglycerides 157 LDL 125. Low fat diet.     Sumner Boast, PA-C  11/27/2014 8:24 AM    [facility] [address], [place], [place]  [address] Phone: [phone]; Fax: [phone]

## 2014-11-29 ENCOUNTER — Ambulatory Visit (INDEPENDENT_AMBULATORY_CARE_PROVIDER_SITE_OTHER): Payer: PPO

## 2014-11-29 DIAGNOSIS — I63512 Cerebral infarction due to unspecified occlusion or stenosis of left middle cerebral artery: Secondary | ICD-10-CM

## 2014-11-29 DIAGNOSIS — Z0289 Encounter for other administrative examinations: Secondary | ICD-10-CM

## 2014-12-06 ENCOUNTER — Encounter: Payer: Self-pay | Admitting: Cardiology

## 2014-12-07 ENCOUNTER — Telehealth (HOSPITAL_COMMUNITY): Payer: Self-pay

## 2014-12-07 LAB — CUP PACEART REMOTE DEVICE CHECK: Date Time Interrogation Session: 20160602144604

## 2014-12-07 NOTE — Telephone Encounter (Signed)
Left message on voicemail in reference to upcoming appointment scheduled on 12-12-2014 at 9:30am with detailed instructions given per Myocardial Perfusion Study Information Sheet for the test. Phone number given for call back for any questions. Oletta Lamas, Stefanee Mckell A

## 2014-12-12 ENCOUNTER — Ambulatory Visit (HOSPITAL_COMMUNITY): Payer: PPO | Attending: Internal Medicine

## 2014-12-12 DIAGNOSIS — R0789 Other chest pain: Secondary | ICD-10-CM

## 2014-12-12 LAB — MYOCARDIAL PERFUSION IMAGING
LV dias vol: 79 mL
LV sys vol: 24 mL
Nuc Stress EF: 69 %
Peak HR: 75 {beats}/min
RATE: 0.3
Rest HR: 53 {beats}/min
SDS: 1
SRS: 2
SSS: 3
TID: 0.96

## 2014-12-12 MED ORDER — TECHNETIUM TC 99M SESTAMIBI GENERIC - CARDIOLITE
11.0000 | Freq: Once | INTRAVENOUS | Status: AC | PRN
  Administered 2014-12-12: 11 via INTRAVENOUS

## 2014-12-12 MED ORDER — TECHNETIUM TC 99M SESTAMIBI GENERIC - CARDIOLITE
33.0000 | Freq: Once | INTRAVENOUS | Status: AC | PRN
  Administered 2014-12-12: 33 via INTRAVENOUS

## 2014-12-12 MED ORDER — REGADENOSON 0.4 MG/5ML IV SOLN
0.4000 mg | Freq: Once | INTRAVENOUS | Status: AC
  Administered 2014-12-12: 0.4 mg via INTRAVENOUS

## 2014-12-13 ENCOUNTER — Encounter: Payer: Self-pay | Admitting: Internal Medicine

## 2014-12-13 ENCOUNTER — Telehealth: Payer: Self-pay | Admitting: *Deleted

## 2014-12-13 NOTE — Telephone Encounter (Signed)
-----   Message from Imogene Burn, PA-C sent at 12/13/2014  7:46 AM EDT ----- Normal lexiscan stress test

## 2014-12-20 ENCOUNTER — Telehealth: Payer: Self-pay | Admitting: Family Medicine

## 2014-12-20 NOTE — Telephone Encounter (Signed)
Patient Name: Margaret Vazquez  DOB: May 23, 1936    Initial Comment Caller States she took both her and her husbands blood pressure meds. this morning. her bp 92/68, feels lightheaded.   Nurse Assessment  Nurse: Leilani Merl, RN, Heather Date/Time (Eastern Time): 12/20/2014 2:04:12 PM  Confirm and document reason for call. If symptomatic, describe symptoms. ---Caller States she took both her and her husbands blood pressure meds. this morning. her BP 92/68, feels a little weird, but is not dizzy.  Has the patient traveled out of the country within the last 30 days? ---Not Applicable  Does the patient require triage? ---Yes  Related visit to physician within the last 2 weeks? ---No  Does the PT have any chronic conditions? (i.e. diabetes, asthma, etc.) ---Yes  List chronic conditions. ---HTN, DM     Guidelines    Guideline Title Affirmed Question Affirmed Notes  Low Blood Pressure [6] Systolic BP 43-838 AND [1] taking blood pressure medications AND [3] NOT dizzy, lightheaded or weak    Final Disposition User   See Physician within 24 Hours Stand[place], RN, Water quality scientist    Comments  Pt is out of town and states that she will go to the local ED to be checked.

## 2014-12-20 NOTE — Telephone Encounter (Signed)
Agree with that advisement  

## 2014-12-21 LAB — HM DIABETES EYE EXAM

## 2014-12-22 ENCOUNTER — Encounter: Payer: Self-pay | Admitting: Family Medicine

## 2014-12-25 ENCOUNTER — Ambulatory Visit (INDEPENDENT_AMBULATORY_CARE_PROVIDER_SITE_OTHER): Payer: PPO | Admitting: *Deleted

## 2014-12-25 DIAGNOSIS — I639 Cerebral infarction, unspecified: Secondary | ICD-10-CM | POA: Diagnosis not present

## 2014-12-25 NOTE — Progress Notes (Signed)
Loop recorder 

## 2015-01-01 ENCOUNTER — Other Ambulatory Visit: Payer: Self-pay

## 2015-01-01 LAB — CUP PACEART REMOTE DEVICE CHECK: Date Time Interrogation Session: 20160627131020

## 2015-01-05 ENCOUNTER — Telehealth: Payer: Self-pay | Admitting: *Deleted

## 2015-01-05 NOTE — Telephone Encounter (Signed)
LM on home phone & cell phone to send manual remote due to AF alert.

## 2015-01-10 ENCOUNTER — Telehealth: Payer: Self-pay | Admitting: *Deleted

## 2015-01-10 NOTE — Telephone Encounter (Signed)
Continued from previous phone note dated 01/05/15---  LM on cell phone VM w/ direct # to device clinic.  2nd attempt.

## 2015-01-10 NOTE — Telephone Encounter (Signed)
Pt currently vacationing in [place]. She will be home Sat 01/13/15. She will send a manual transmission once home. Pt taking Ticlid as directed.

## 2015-01-10 NOTE — Telephone Encounter (Signed)
Attempted to reach pt on home phone. Digital VM states box full.   Attempted cell phone:

## 2015-01-15 NOTE — Telephone Encounter (Signed)
Updated pt her ILR episode was not AF. Per Dr. Rayann Heman, she was in sinus rhythm w/ PACs and baseline artifact. Pt thankful I called her back. Pt aware no need for medicinal changes or to make an appt to see Dr. Caryl Comes.

## 2015-01-18 ENCOUNTER — Encounter: Payer: Self-pay | Admitting: Internal Medicine

## 2015-01-23 ENCOUNTER — Ambulatory Visit (INDEPENDENT_AMBULATORY_CARE_PROVIDER_SITE_OTHER): Payer: PPO | Admitting: *Deleted

## 2015-01-23 DIAGNOSIS — I639 Cerebral infarction, unspecified: Secondary | ICD-10-CM

## 2015-01-23 NOTE — Progress Notes (Signed)
Loop recorder 

## 2015-01-24 ENCOUNTER — Encounter: Payer: Self-pay | Admitting: Internal Medicine

## 2015-01-26 LAB — CUP PACEART REMOTE DEVICE CHECK
Date Time Interrogation Session: 20160710165334
Zone Setting Detection Interval: 2000 ms
Zone Setting Detection Interval: 3000 ms
Zone Setting Detection Interval: 390 ms

## 2015-02-05 ENCOUNTER — Encounter: Payer: Self-pay | Admitting: Internal Medicine

## 2015-02-14 ENCOUNTER — Other Ambulatory Visit (INDEPENDENT_AMBULATORY_CARE_PROVIDER_SITE_OTHER): Payer: PPO

## 2015-02-14 DIAGNOSIS — E119 Type 2 diabetes mellitus without complications: Secondary | ICD-10-CM

## 2015-02-14 LAB — HEMOGLOBIN A1C: Hgb A1c MFr Bld: 6.4 % (ref 4.6–6.5)

## 2015-02-19 ENCOUNTER — Encounter: Payer: Self-pay | Admitting: Family Medicine

## 2015-02-19 ENCOUNTER — Ambulatory Visit (INDEPENDENT_AMBULATORY_CARE_PROVIDER_SITE_OTHER): Payer: PPO | Admitting: Family Medicine

## 2015-02-19 VITALS — BP 126/64 | HR 62 | Temp 98.5°F | Ht 69.0 in | Wt 199.0 lb

## 2015-02-19 DIAGNOSIS — E119 Type 2 diabetes mellitus without complications: Secondary | ICD-10-CM | POA: Diagnosis not present

## 2015-02-19 DIAGNOSIS — I10 Essential (primary) hypertension: Secondary | ICD-10-CM | POA: Diagnosis not present

## 2015-02-19 NOTE — Assessment & Plan Note (Signed)
Lab Results  Component Value Date   HGBA1C 6.4 02/14/2015   Improved with better habits and current medication  Enc further wt loss and low glycemic diet

## 2015-02-19 NOTE — Assessment & Plan Note (Signed)
bp in fair control at this time  BP Readings from Last 1 Encounters:  02/19/15 126/64   No changes needed Disc lifstyle change with low sodium diet and exercise  No further stroke symptoms

## 2015-02-19 NOTE — Progress Notes (Signed)
Pre visit review using our clinic review tool, if applicable. No additional management support is needed unless otherwise documented below in the visit note. 

## 2015-02-19 NOTE — Patient Instructions (Signed)
You are doing great - weight is better and good blood pressure and also better diabetes control  Stay active Keep eating a higher protein breakfast (egg/fruit/toast) - to keep glucose from dropping too low   You need a pneumonia vaccine booster at the next visit  (prevnar)   Follow up in 6 months for annual exam with labs prior

## 2015-02-19 NOTE — Progress Notes (Signed)
Subjective:    Patient ID: Margaret Vazquez, female    DOB: 16-Mar-1936, 79 y.o.   MRN: [id number]  HPI Here for f/u of chronic health problems   Wt is down 5 lb with bmi of 29 She is eating better and staying active (working outside also) [place] a [place]   bp is stable today  No cp or palpitations or headaches or edema  No side effects to medicines  BP Readings from Last 3 Encounters:  02/19/15 126/64  11/27/14 130/60  11/22/14 112/62     Had a nl stress test since last visit Also underwent loop recorder   Diabetes Home sugar results - occ low - if she eats cereal for breakfast can't make it to lunch -  Now eating egg and fruit and no problems  DM diet - really doing better  Exercise -more lately  Symptoms- none  A1C last  Lab Results  Component Value Date   HGBA1C 6.4 02/14/2015  down from 7   No problems with medications - no low sugars if she eats the right thing Not skipping meals  Renal protection ARB Last eye exam 6/15  Needs prevnar - wants to wait until the fall    Patient Active Problem List   Diagnosis Date Noted  . Chest discomfort 11/22/2014  . Obesity 08/23/2014  . Type 2 diabetes mellitus with vascular disease 01/12/2014  . TIA (transient ischemic attack) 01/11/2014  . CVA (cerebral infarction) 01/11/2014  . Sensory disturbance 01/11/2014  . Pain of right thumb 11/14/2013  . Left hip pain 11/14/2013  . Chest pain at rest 08/31/2013  . LOOP Recorder LINQ 06/07/2013  . Fatigue 06/07/2013  . Sinusitis, chronic 05/16/2013  . Acute ischemic stroke 02/15/2013  . Numbness and tingling of right arm 02/14/2013  . Encounter for Medicare annual wellness exam 10/27/2012  . Encopresis(307.7) 12/15/2011  . Post-menopausal 10/28/2011  . PERSONAL HX COLONIC POLYPS 09/04/2009  . VITAMIN B12 DEFICIENCY 03/06/2009  . UNSPECIFIED ANEMIA 02/13/2009  . ALLERGIC RHINITIS 02/14/2008  . BACK PAIN, LUMBAR 11/03/2007  . Hyperlipidemia  11/24/2006  . Essential hypertension 11/24/2006  . Diabetes type 2, controlled 10/09/2006  . GERD 10/09/2006  . OVERACTIVE BLADDER 10/09/2006  . INCONTINENCE, URGE 10/09/2006  . SKIN CANCER, HX OF 10/09/2006   Past Medical History  Diagnosis Date  . Hypertension   . Diabetes mellitus     type II  . Hyperlipidemia     myalgias with Lipitor and Zetia  . GERD (gastroesophageal reflux disease)   . Skin cancer     hx of basal cell/ak's  . Colon polyps 2009  . Allergy history, drug     Aspirin  . Cervical stenosis of spine     With neck pain  . CAD (coronary artery disease) 04/08/11    non obst by cath  . Migraine   . TIA (transient ischemic attack)   . Stroke August 2014   Past Surgical History  Procedure Laterality Date  . Appendectomy    . Tubal ligation      BTL  . Breast surgery  1992    breast biopsy  . Eye surgery  2005    tear duct surgery  . Nasal sinus surgery  01/2005  . Cardiac catheterization  1993    LHC: normal coronaries.   . Stress cardiolite  11/1999    Normal/ negative  . Cystoscopy w/ decannulation  03/2000    Normal  .  Abd [place]  07/2003    Negative  . Tear duct surgery  2005  . Colonoscopy  12/2007    Adenomatous colon polyps  . Cardiac catheterization  04/07/2011    non obst CAD (Dr Burt Knack)  . Tee without cardioversion N/A 02/16/2013    Procedure: TRANSESOPHAGEAL ECHOCARDIOGRAM (TEE);  Surgeon: Josue Hector, MD;  Location: [facility] ENDOSCOPY;  Service: Cardiovascular;  Laterality: N/A;  . Loop recorder implant    . Loop recorder implant N/A 02/16/2013    Procedure: LOOP RECORDER IMPLANT;  Surgeon: Deboraha Sprang, MD;  Location: [facility] CATH LAB;  Service: Cardiovascular;  Laterality: N/A;   Social History  Substance Use Topics  . Smoking status: Never Smoker   . Smokeless tobacco: Never Used  . Alcohol Use: No   Family History  Problem Relation Age of Onset  . Lung cancer Brother   . Colon cancer Neg Hx   . Skin cancer Daughter   . Diabetes Sister    . Diabetes Brother   . Pancreatic cancer Brother   . Heart disease Mother   . Heart disease Father   . Brain cancer Other    Allergies  Allergen Reactions  . Bee Venom Hives, Shortness Of Breath and Swelling  . Nabumetone Anaphylaxis  . Amoxicillin-Pot Clavulanate Hives and Swelling    To lips.  . Aspirin Hives  . Atorvastatin Swelling     joint pain/swelling, inc liver tests  . Clopidogrel Bisulfate Hives  . Codeine Nausea And Vomiting  . Ezetimibe Other (See Comments)     fatigue  . Metformin And Related Other (See Comments)    Diarrhea   . Valsartan Other (See Comments)     fatigue   Current Outpatient Prescriptions on File Prior to Visit  Medication Sig Dispense Refill  . diphenhydrAMINE (BENADRYL) 25 MG tablet Take 50 mg by mouth every 8 (eight) hours as needed for allergies.     Marland Kitchen EPINEPHrine (EPI-PEN) 0.3 mg/0.3 mL DEVI Inject 0.3 mg into the muscle daily as needed (allergic reaction).     . fluticasone (FLONASE) 50 MCG/ACT nasal spray Place 2 sprays into both nostrils 2 (two) times daily.    Marland Kitchen glipiZIDE (GLUCOTROL XL) 10 MG 24 hr tablet Take 1 tablet (10 mg total) by mouth daily with breakfast. 30 tablet 11  . losartan-hydrochlorothiazide (HYZAAR) 100-25 MG per tablet Take 1 tablet by mouth daily. 30 tablet 11  . ticlopidine (TICLID) 250 MG tablet Take 1 tablet (250 mg total) by mouth 2 (two) times daily with a meal. 180 tablet 0  . tobramycin-dexamethasone (TOBRADEX) ophthalmic solution Place 1 drop into both eyes daily as needed (dryness).     . [DISCONTINUED] levETIRAcetam (KEPPRA) 250 MG tablet Take 1 tablet (250 mg total) by mouth 2 (two) times daily. 60 tablet 0   No current facility-administered medications on file prior to visit.     Review of Systems     Objective:   Physical Exam        Assessment & Plan:

## 2015-02-20 ENCOUNTER — Encounter: Payer: Self-pay | Admitting: Internal Medicine

## 2015-02-22 ENCOUNTER — Ambulatory Visit (INDEPENDENT_AMBULATORY_CARE_PROVIDER_SITE_OTHER): Payer: PPO | Admitting: *Deleted

## 2015-02-22 DIAGNOSIS — I639 Cerebral infarction, unspecified: Secondary | ICD-10-CM | POA: Diagnosis not present

## 2015-02-22 NOTE — Progress Notes (Signed)
Loop recorder 

## 2015-03-05 LAB — CUP PACEART REMOTE DEVICE CHECK: Date Time Interrogation Session: 20160829090906

## 2015-03-08 ENCOUNTER — Encounter: Payer: PPO | Admitting: Internal Medicine

## 2015-03-15 ENCOUNTER — Encounter: Payer: Self-pay | Admitting: Gastroenterology

## 2015-03-23 ENCOUNTER — Ambulatory Visit (INDEPENDENT_AMBULATORY_CARE_PROVIDER_SITE_OTHER): Payer: PPO | Admitting: *Deleted

## 2015-03-23 DIAGNOSIS — I639 Cerebral infarction, unspecified: Secondary | ICD-10-CM

## 2015-03-27 NOTE — Progress Notes (Signed)
Loop recorder 

## 2015-03-28 ENCOUNTER — Encounter: Payer: Self-pay | Admitting: Internal Medicine

## 2015-03-31 LAB — CUP PACEART REMOTE DEVICE CHECK: Date Time Interrogation Session: 20160917073603

## 2015-03-31 NOTE — Progress Notes (Signed)
Carelink summary report received. Battery status OK. Normal device function. No new symptom episodes, tachy episodes, brady, or pause episodes. No new AF episodes. Monthly summary reports and ROV with SK on 04/19/15 at 10:00am.

## 2015-04-17 ENCOUNTER — Encounter: Payer: Self-pay | Admitting: Internal Medicine

## 2015-04-19 ENCOUNTER — Encounter: Payer: PPO | Admitting: Internal Medicine

## 2015-04-23 ENCOUNTER — Ambulatory Visit (INDEPENDENT_AMBULATORY_CARE_PROVIDER_SITE_OTHER): Payer: PPO | Admitting: *Deleted

## 2015-04-23 DIAGNOSIS — I638 Other cerebral infarction: Secondary | ICD-10-CM

## 2015-04-23 DIAGNOSIS — I6389 Other cerebral infarction: Secondary | ICD-10-CM

## 2015-04-24 ENCOUNTER — Telehealth: Payer: Self-pay | Admitting: *Deleted

## 2015-04-24 NOTE — Telephone Encounter (Signed)
LMVM for pt to return call at her convenience.  Has appt with Dr. Leonie Man 05-04-15, CM/NP assisting his pts.  See her earlier?

## 2015-04-25 NOTE — Telephone Encounter (Signed)
Patient returned call. Please call back at [phone].

## 2015-04-25 NOTE — Telephone Encounter (Signed)
I spoke to pt and relayed that CM/NP Dr. Clydene Fake NP can see her sooner if she would like to come in.  Pt was ok to do this and appt was moved up to 05-01-15 at 0930 wit CM/NP.

## 2015-04-30 ENCOUNTER — Encounter: Payer: PPO | Admitting: Internal Medicine

## 2015-05-01 ENCOUNTER — Ambulatory Visit (INDEPENDENT_AMBULATORY_CARE_PROVIDER_SITE_OTHER): Payer: PPO | Admitting: Nurse Practitioner

## 2015-05-01 ENCOUNTER — Encounter: Payer: Self-pay | Admitting: Nurse Practitioner

## 2015-05-01 VITALS — BP 150/73 | HR 62 | Ht 69.0 in | Wt 203.0 lb

## 2015-05-01 DIAGNOSIS — I10 Essential (primary) hypertension: Secondary | ICD-10-CM

## 2015-05-01 DIAGNOSIS — I639 Cerebral infarction, unspecified: Secondary | ICD-10-CM | POA: Diagnosis not present

## 2015-05-01 DIAGNOSIS — G458 Other transient cerebral ischemic attacks and related syndromes: Secondary | ICD-10-CM

## 2015-05-01 DIAGNOSIS — E785 Hyperlipidemia, unspecified: Secondary | ICD-10-CM

## 2015-05-01 DIAGNOSIS — E119 Type 2 diabetes mellitus without complications: Secondary | ICD-10-CM

## 2015-05-01 NOTE — Patient Instructions (Signed)
Continue Ticlidopine for secondary stroke prevention Maintain strict control of hypertension with blood pressure goal below 130/90, today's reading 150/73, please take blood pressure medicine at home diabetes with hemoglobin A1c goal below 6.5%  Lipids with LDL cholesterol goal below 100 mg/dL.  Eat a healthy diet with plenty of whole grains, cereals, fruits and vegetables,  Exercise regularly by walking and maintain ideal body weight.  Check follow-up carotid  Doppler studies in May 2017

## 2015-05-01 NOTE — Progress Notes (Signed)
[facility]  PATIENT: Margaret Vazquez DOB: 1936-04-04   REASON FOR VISIT: history of MCA stroke, hypertension, diabetes HISTORY FROM:patient    HISTORY OF PRESENT ILLNESS:05/11/13 (PS): Margaret Vazquez is a 25 year Caucasian lady who is seen for followup of recent hospital consults in August and October 2014. She was initially seen on 02/17/39 and when she was admitted for evaluation for multiple episodes of transient right arm numbness and tingling. Her symptoms got worse on 02/14/13 and she had difficulty eating with a fork as well as writing. MRI scan the brain showed multifocal infarcts in the left frontal and parietal MCA territory an MRI of the brain showed severe proximal left M1 stenosis. Transthoracic echocardiogram showed normal ejection fraction. Trans esophageal echocardiogram showed no cardiac source of embolism or PFO. Hemoglobin A1c was 6.5%. Total cholesterol was 172, triglycerides 161, HDL 41 and LDL 99 mg percent. She was started on aspirin and Plavix for stroke prevention and advised aggressive risk factor control. She also had a loop recorder implanted for atrial fibrillation detection. She subsequently returned on 04/25/39 and with transient episode of right upper extremity no weakness in addition to stiffening and twisting of her right arm and inability to control it. She was evaluated for possible sensory seizures with EEG, which was negative but she was started on low-dose Keppra 250 twice a day. She states she had trouble tolerating Keppra and developed itching as well as rash and was seen on the emergency room on 05/05/13 where Keppra was discontinued and she was switched to gabapentin. She always states that she is having trouble tolerating it and complains of sleepiness on it even though she is taking only 1 tablet daily. She had a CT head on 1019/14 which was stable. She complains of tiredness and decreased stamina. She's noticed that she still has some diminished  fine motor skills in the right hand. She states her blood pressure and sugars have been under good control. She remains on ticlopidine for secondary stroke prevention/she has an aspirin allergy and complained of feeling lethargic while taking Plavix in the past. She has seen me in the past for cervicogenic muscle tension headaches and tremors and was last in the office in 2013. UPDATE 02/22/14 (LL): Since last visit Margaret Vazquez was hospitalized in July 2015  for concern of stroke. She had 4 separate episodes of her left hand drawing up and decided to proceed to the emergency room. Episodes are similar to what she had in the right hand last year. MRI was negative for acute infarct. EEG was ordered in the hospital but not completed. She's had 2 similar episodes since leaving the hospital, on the left side also. Her blood pressure is been under good control it is 115/65 in the office today. She states that her cholesterol is under good control, and indeed it is below goal on no statin medication. She's tolerating Ticlid well without significant bleeding or bruising. She has no other complaints. Update 11/01/2014 PS: she returns for follow-up after last visit in August 2015. She is doing better now and has not had any abnormal involuntary movements or seizure-like jerking activity. She was unable to tolerate Keppra and gabapentin in the past but these episodes. She had prolonged EEG done after last visit which have personally reviewed and showed no evidence of epileptiform activity. She continues to have mild diminished fine motor skills in the right hand and some weakness but it is unchanged. Her sister had a stroke 3 weeks  ago. She has some mild intermittent tingling in her feet likely from her diabetic neuropathy but this is not bothersome. She states her blood pressure is well controlled. Her fasting sugars usually range in the 110 range and last hemoglobin A1c is slightly elevated at 7.3. She plans to see her  primary physician next week and will have lipid profile checked. She has a loop recorder in place and so far atrial fibrillation has not been discovered. She is tolerating Ticlid well without any side effects. She has not had any repeat Doppler studies done for the brain and neck in more than a year. UPDATE 05/01/2015. Margaret Vazquez, 79 year old female returns for stroke follow-up.she has risk factors of hypertension , hyperlipidemia and diabetes. She had an episode of of her left hand drawing and  [place] She went to a local urgent care and her shoulder was injected with relief. She has not had further problems.She continues to have mild diminished fine motor skills in the right hand but this is unchanged. She claims her hemoglobin A1c was 6.3 at last check. She continues to have a loop recorder in place no  atrial fibrillation has not been discovered. She is on Ticlid for secondary stroke prevention and gets this from [place].Carotid Doppler study after last visit on 11/29/14  Consistent with 50-69% reduction in the right proximal ICA.She returns for reevaluation. Last lipid panel 11/15/2014 with cholesterol 203, triglycerides 157, and LDL 125. She returns for reevaluation    REVIEW OF SYSTEMS: Full 14 system review of systems performed and notable only for those listed, all others are neg:  Constitutional: neg  Cardiovascular: neg Ear/Nose/Throat: neg  Skin: neg Eyes: right eye reddened Respiratory: neg Gastroitestinal: urinary urgency Hematology/Lymphatic: easy bruising Endocrine: neg Musculoskeletal:neg Allergy/Immunology: neg Neurological: neg Psychiatric: neg Sleep : neg   ALLERGIES: Allergies  Allergen Reactions  . Bee Venom Hives, Shortness Of Breath and Swelling  . Nabumetone Anaphylaxis  . Amoxicillin-Pot Clavulanate Hives and Swelling    To lips.  . Aspirin Hives  . Atorvastatin Swelling     joint pain/swelling, inc liver tests  .  Clopidogrel Bisulfate Hives  . Codeine Nausea And Vomiting  . Ezetimibe Other (See Comments)     fatigue  . Metformin And Related Other (See Comments)    Diarrhea   . Valsartan Other (See Comments)     fatigue    HOME MEDICATIONS: Outpatient Prescriptions Prior to Visit  Medication Sig Dispense Refill  . diphenhydrAMINE (BENADRYL) 25 MG tablet Take 50 mg by mouth every 8 (eight) hours as needed for allergies.     Marland Kitchen EPINEPHrine (EPI-PEN) 0.3 mg/0.3 mL DEVI Inject 0.3 mg into the muscle daily as needed (allergic reaction).     . fluticasone (FLONASE) 50 MCG/ACT nasal spray Place 2 sprays into both nostrils 2 (two) times daily as needed.     Marland Kitchen glipiZIDE (GLUCOTROL XL) 10 MG 24 hr tablet Take 1 tablet (10 mg total) by mouth daily with breakfast. 30 tablet 11  . losartan-hydrochlorothiazide (HYZAAR) 100-25 MG per tablet Take 1 tablet by mouth daily. 30 tablet 11  . ticlopidine (TICLID) 250 MG tablet Take 1 tablet (250 mg total) by mouth 2 (two) times daily with a meal. 180 tablet 0  . tobramycin-dexamethasone (TOBRADEX) ophthalmic solution Place 1 drop into both eyes daily as needed (dryness).      No facility-administered medications prior to visit.    PAST MEDICAL HISTORY: Past Medical History  Diagnosis  Date  . Hypertension   . Diabetes mellitus     type II  . Hyperlipidemia     myalgias with Lipitor and Zetia  . GERD (gastroesophageal reflux disease)   . Skin cancer     hx of basal cell/ak's  . Colon polyps 2009  . Allergy history, drug     Aspirin  . Cervical stenosis of spine     With neck pain  . CAD (coronary artery disease) 04/08/11    non obst by cath  . Migraine   . TIA (transient ischemic attack)   . Stroke [facility]) August 2014    PAST SURGICAL HISTORY: Past Surgical History  Procedure Laterality Date  . Appendectomy    . Tubal ligation      BTL  . Breast surgery  1992    breast biopsy  . Eye surgery  2005    tear duct surgery  . Nasal sinus surgery   01/2005  . Cardiac catheterization  1993    LHC: normal coronaries.   . Stress cardiolite  11/1999    Normal/ negative  . Cystoscopy w/ decannulation  03/2000    Normal  . Abd [place]  07/2003    Negative  . Tear duct surgery  2005  . Colonoscopy  12/2007    Adenomatous colon polyps  . Cardiac catheterization  04/07/2011    non obst CAD (Dr Burt Knack)  . Tee without cardioversion N/A 02/16/2013    Procedure: TRANSESOPHAGEAL ECHOCARDIOGRAM (TEE);  Surgeon: Josue Hector, MD;  Location[facility] ENDOSCOPY;  Service: Cardiovascular;  Laterality: N/A;  . Loop recorder implant    . Loop recorder implant N/A 02/16/2013    Procedure: LOOP RECORDER IMPLANT;  Surgeon: Deboraha Sprang, MD;  Location: [facility] CATH LAB;  Service: Cardiovascular;  Laterality: N/A;    FAMILY HISTORY: Family History  Problem Relation Age of Onset  . Lung cancer Brother   . Colon cancer Neg Hx   . Skin cancer Daughter   . Diabetes Sister   . Diabetes Brother   . Pancreatic cancer Brother   . Heart disease Mother   . Heart disease Father   . Brain cancer Other     SOCIAL HISTORY: Social History   Social History  . Marital Status: Married    Spouse Name: Eddie Dibbles  . Number of Children: 3  . Years of Education: 12   Occupational History  . Retired    Social History Main Topics  . Smoking status: Never Smoker   . Smokeless tobacco: Never Used  . Alcohol Use: No  . Drug Use: No  . Sexual Activity: No   Other Topics Concern  . Not on file   Social History Narrative   3 children   Does not drink caffeinated beverages   Cares for SIL with dementia     PHYSICAL EXAM  Filed Vitals:   05/01/15 0912  BP: 150/73  Pulse: 62  Height: 5' 9"$  (1.753 m)  Weight: 203 lb (92.08 kg)   Body mass index is 29.96 kg/(m^2). General: well developed, well nourished elderly Caucasian lady, seated, in no evident distress, well-groomed  Head: head normocephalic and atraumatic.  Neck: supple with no carotid or supraclavicular  bruits  Cardiovascular: regular rate and rhythm, no murmurs  Musculoskeletal: no deformity  Skin: no rash/petichiae  Vascular: Normal pulses all extremities   Neurologic Exam  Mental Status: Awake and fully alert. Oriented to place and time. Recent and remote memory intact. Attention span, concentration and  fund of knowledge appropriate. Mood and affect appropriate.  Cranial Nerves: Fundoscopic exam not done. Pupils equal, briskly reactive to light. Extraocular movements full without nystagmus. Visual fields full to confrontation. Hearing intact. Facial sensation intact. Face, tongue, palate moves normally and symmetrically.  Motor: Normal bulk and tone. Normal strength in all tested extremity muscles. Diminished fine finger movements on the right. Orbits left-to-right upper extremity. Minimal weakness of right grip muscles only.  Sensory: intact to touch and pinprick and vibratory sensation. But has subjective paresthesias in the right hand  Coordination: Rapid alternating movements normal in all extremities. Finger-to-nose and heel-to-shin performed accurately bilaterally.  Gait and Station: Arises from chair without difficulty. Stance is normal. Gait demonstrates normal stride length and balance . Not able to heel, toe and unsteady with tandem walk. No assistive device  Reflexes: 1+ and symmetric. Toes downgoing.     DIAGNOSTIC DATA (LABS, IMAGING, TESTING) -    Component Value Date/Time   NA 138 11/15/2014 0811   NA 144 08/29/2011 2307   K 3.9 11/15/2014 0811   K 3.6 08/29/2011 2307   CL 104 11/15/2014 0811   CL 103 08/29/2011 2307   CO2 30 11/15/2014 0811   CO2 26 08/29/2011 2307   GLUCOSE 140* 11/15/2014 0811   GLUCOSE 162* 08/29/2011 2307   BUN 17 11/15/2014 0811   BUN 14 08/29/2011 2307   CREATININE 0.96 11/15/2014 0811   CREATININE 0.98 01/27/2014 1448   CREATININE 1.03 08/29/2011 2307   CALCIUM 8.9 11/15/2014 0811   CALCIUM 8.9 08/29/2011 2307   PROT 6.7  11/15/2014 0811   ALBUMIN 3.6 11/15/2014 0811   AST 15 11/15/2014 0811   ALT 13 11/15/2014 0811   ALKPHOS 81 11/15/2014 0811   BILITOT 0.4 11/15/2014 0811   GFRNONAA 56* 06/23/2014 1904   GFRNONAA 56* 08/29/2011 2307   GFRAA 65* 06/23/2014 1904   GFRAA >60 08/29/2011 2307   Lab Results  Component Value Date   CHOL 203* 11/15/2014   HDL 46.80 11/15/2014   LDLCALC 125* 11/15/2014   LDLDIRECT 136.5 10/20/2011   TRIG 157.0* 11/15/2014   CHOLHDL 4 11/15/2014   Lab Results  Component Value Date   HGBA1C 6.4 02/14/2015   ASSESSMENT AND PLAN 66 year Caucasian lady with left middle cerebral artery branch infarcts in August 2014 secondary to severe proximal left M1 stenosis who has had recurrent episodes of right arm paresthesias with one episode in October 2014 with involuntary twisting and lack of control and shaking of the hand raising concern for simple partial seizure versus myoclonus. She was started on Keppra but did not tolerate it well due to hives and itching and was changed to gabapentin which also she had trouble tolerating due to sleepiness. She has done well of late without any episodes.Carotid Doppler study after last visit on 11/29/14  Consistent with 50-69% reduction in the right proximal ICA.Records reviewed.  Continue Ticlidopine for secondary stroke prevention Maintain strict control of hypertension with blood pressure goal below 130/90, today's reading 150/73, please take blood pressure medicine when you get  home diabetes with hemoglobin A1c goal below 6.5% last 6.3 Lipids with LDL cholesterol goal below 100 last was 125, total cholesterol below 200 last was 203.   Eat a healthy diet with plenty of whole grains, cereals, fruits and vegetables,  Exercise regularly by walking and maintain ideal body weight.  Check follow-up carotid  Doppler studies in May 2017 Vst time 25 min Dennie Bible, [facility], [facility], APRN  [facility]  [address], [address], [place] [address] [phone]

## 2015-05-02 ENCOUNTER — Encounter: Payer: Self-pay | Admitting: Internal Medicine

## 2015-05-02 ENCOUNTER — Ambulatory Visit (INDEPENDENT_AMBULATORY_CARE_PROVIDER_SITE_OTHER): Payer: PPO | Admitting: Internal Medicine

## 2015-05-02 ENCOUNTER — Encounter: Payer: PPO | Admitting: Internal Medicine

## 2015-05-02 VITALS — BP 142/74 | HR 59 | Ht 69.0 in | Wt 203.2 lb

## 2015-05-02 DIAGNOSIS — I639 Cerebral infarction, unspecified: Secondary | ICD-10-CM

## 2015-05-02 DIAGNOSIS — I10 Essential (primary) hypertension: Secondary | ICD-10-CM

## 2015-05-02 LAB — CUP PACEART INCLINIC DEVICE CHECK: Date Time Interrogation Session: 20161026133723

## 2015-05-02 NOTE — Progress Notes (Signed)
Loop recorder 

## 2015-05-02 NOTE — Progress Notes (Signed)
[email or web address]      Patient Care Team: Abner Greenspan, MD as PCP - General   HPI  Margaret Vazquez is a 79 y.o. female Seen in followup for a loop recorder implanted for cryptogenic stroke. She has significant hypertension which is much improved  She developed hypotension and her carvedilol was discontinued.   Her blood pressures have been more stable. There in the 130 -140 range at home. She recalls some change to her blood pressure medications but cannot recall when or what it was. Looking through the last few notes from her primary care physician and neurology her blood pressures have been in the 1:30-150 range.   She had an episode of prolonged left arm and shoulder discomfort a few weeks ago. Total duration was about 6 hours. She was seen in the emergency room at [facility] with negative enzymes 2. She was subtotally discharged. She was given something for pain which mitigated her symptoms.  .     Past Medical History  Diagnosis Date  . Hypertension   . Diabetes mellitus     type II  . Hyperlipidemia     myalgias with Lipitor and Zetia  . GERD (gastroesophageal reflux disease)   . Skin cancer     hx of basal cell/ak's  . Colon polyps 2009  . Allergy history, drug     Aspirin  . Cervical stenosis of spine     With neck pain  . CAD (coronary artery disease) 04/08/11    non obst by cath  . Migraine   . TIA (transient ischemic attack)   . Stroke [facility]) August 2014    Past Surgical History  Procedure Laterality Date  . Appendectomy    . Tubal ligation      BTL  . Breast surgery  1992    breast biopsy  . Eye surgery  2005    tear duct surgery  . Nasal sinus surgery  01/2005  . Cardiac catheterization  1993    LHC: normal coronaries.   . Stress cardiolite  11/1999    Normal/ negative  . Cystoscopy w/ decannulation  03/2000    Normal  . Abd [place]  07/2003    Negative  . Tear duct surgery  2005  . Colonoscopy  12/2007    Adenomatous colon polyps  . Cardiac catheterization   04/07/2011    non obst CAD (Dr Burt Knack)  . Tee without cardioversion N/A 02/16/2013    Procedure: TRANSESOPHAGEAL ECHOCARDIOGRAM (TEE);  Surgeon: Josue Hector, MD;  Location: [facility] ENDOSCOPY;  Service: Cardiovascular;  Laterality: N/A;  . Loop recorder implant    . Loop recorder implant N/A 02/16/2013    Procedure: LOOP RECORDER IMPLANT;  Surgeon: Deboraha Sprang, MD;  Location: [facility] CATH LAB;  Service: Cardiovascular;  Laterality: N/A;    Current Outpatient Prescriptions  Medication Sig Dispense Refill  . diphenhydrAMINE (BENADRYL) 25 MG tablet Take 50 mg by mouth every 8 (eight) hours as needed for allergies.     Marland Kitchen EPINEPHrine (EPI-PEN) 0.3 mg/0.3 mL DEVI Inject 0.3 mg into the muscle daily as needed (allergic reaction).     . fluticasone (FLONASE) 50 MCG/ACT nasal spray Place 2 sprays into both nostrils 2 (two) times daily as needed.     Marland Kitchen glipiZIDE (GLUCOTROL XL) 10 MG 24 hr tablet Take 1 tablet (10 mg total) by mouth daily with breakfast. 30 tablet 11  . losartan-hydrochlorothiazide (HYZAAR) 100-25 MG per tablet Take 1 tablet by mouth daily. 30 tablet 11  .  ticlopidine (TICLID) 250 MG tablet Take 1 tablet (250 mg total) by mouth 2 (two) times daily with a meal. 180 tablet 0  . [DISCONTINUED] levETIRAcetam (KEPPRA) 250 MG tablet Take 1 tablet (250 mg total) by mouth 2 (two) times daily. 60 tablet 0   No current facility-administered medications for this visit.    Allergies  Allergen Reactions  . Bee Venom Hives, Shortness Of Breath and Swelling  . Nabumetone Anaphylaxis  . Amoxicillin-Pot Clavulanate Hives and Swelling    To lips.  . Aspirin Hives  . Atorvastatin Swelling     joint pain/swelling, inc liver tests  . Clopidogrel Bisulfate Hives  . Codeine Nausea And Vomiting  . Ezetimibe Other (See Comments)     fatigue  . Metformin And Related Other (See Comments)    Diarrhea   . Valsartan Other (See Comments)     fatigue    Review of Systems negative except from HPI and  PMH  Physical Exam BP 142/74 mmHg  Pulse 59  Ht 5\' 9"  (1.753 m)  Wt 203 lb 3.2 oz (92.171 kg)  BMI 29.99 kg/m2 Well developed and well nourished in no acute distress HENT normal E scleral and icterus clear Neck Supple JVP flat; carotids brisk and full Clear to ausculation  Device pocket well healed; without hematoma or erythema.  There is no tethering  Regular rate and rhythm, no murmurs gallops or rub Soft with active bowel sounds No clubbing cyanosis no Edema Alert and oriented, grossly normal motor and sensory function Skin Warm and Dry    Assessment and  Plan  Cryptogenic stroke  Implantable loop recorder  Labile blood pressure   blood pressures have been reasonable at home. Based on the  [employer]  trial   Blood pressure target would be appropriate about 1:30 with her diabetes.    the recently published clarify  trial is somewhat sobering.   the episode of chest pain of prolonged duration with negative enzymes and a recent negative Myoview I think strongly speaks against this being coronary event.   There is no evidence of atrial fibrillation.  We'll continue to follow her loop recorder remotely and see her in one year

## 2015-05-02 NOTE — Patient Instructions (Signed)
Medication Instructions: - none  Labwork: - none  Procedures/Testing: - none  Follow-Up: - Your physician wants you to follow-up in: 1 year with Dr. Klein. You will receive a reminder letter in the mail two months in advance. If you don't receive a letter, please call our office to schedule the follow-up appointment.  Any Additional Special Instructions Will Be Listed Below (If Applicable). - none  

## 2015-05-02 NOTE — Progress Notes (Signed)
I agree with the above plan 

## 2015-05-04 ENCOUNTER — Ambulatory Visit: Payer: PPO | Admitting: Neurology

## 2015-05-08 LAB — CUP PACEART REMOTE DEVICE CHECK: Date Time Interrogation Session: 20161017080613

## 2015-05-08 NOTE — Progress Notes (Signed)
Carelink summary report received. Battery status OK. Normal device function. No new symptom episodes, tachy episodes, brady, or pause episodes. No new AF episodes. Monthly summary reports and ROV with SK in 04/2016.

## 2015-05-17 ENCOUNTER — Encounter: Payer: Self-pay | Admitting: Cardiology

## 2015-05-23 ENCOUNTER — Ambulatory Visit (INDEPENDENT_AMBULATORY_CARE_PROVIDER_SITE_OTHER): Payer: PPO | Admitting: *Deleted

## 2015-05-23 DIAGNOSIS — I639 Cerebral infarction, unspecified: Secondary | ICD-10-CM | POA: Diagnosis not present

## 2015-05-23 NOTE — Progress Notes (Signed)
Carelink Summary Report / Loop recorder 

## 2015-05-24 ENCOUNTER — Encounter: Payer: Self-pay | Admitting: Cardiology

## 2015-06-14 ENCOUNTER — Ambulatory Visit
Admission: RE | Admit: 2015-06-14 | Discharge: 2015-06-14 | Disposition: A | Discharge status: Home / Self Care | Payer: PPO | Source: Ambulatory Visit | Attending: Family Medicine | Admitting: Family Medicine

## 2015-06-14 ENCOUNTER — Ambulatory Visit (INDEPENDENT_AMBULATORY_CARE_PROVIDER_SITE_OTHER): Payer: PPO | Admitting: Family Medicine

## 2015-06-14 ENCOUNTER — Telehealth: Payer: Self-pay | Admitting: Family Medicine

## 2015-06-14 ENCOUNTER — Encounter: Payer: Self-pay | Admitting: Family Medicine

## 2015-06-14 VITALS — BP 122/60 | HR 63 | Temp 98.0°F | Wt 202.5 lb

## 2015-06-14 DIAGNOSIS — M25552 Pain in left hip: Secondary | ICD-10-CM | POA: Diagnosis not present

## 2015-06-14 DIAGNOSIS — M25562 Pain in left knee: Secondary | ICD-10-CM | POA: Insufficient documentation

## 2015-06-14 MED ORDER — HYDROCODONE-ACETAMINOPHEN 5-325 MG PO TABS: 1.0000 | ORAL_TABLET | Freq: Four times a day (QID) | ORAL | Status: DC | PRN

## 2015-06-14 NOTE — Patient Instructions (Signed)
Margaret Vazquez will call about your referral. Go see her on the way out.  Use the pain medicine as needed- sedation caution.   We'll talk to you about the ultrasound. If you have a clot, then we'll need to change your med around.  Take care.  Glad to see you.

## 2015-06-14 NOTE — Assessment & Plan Note (Signed)
U/s neg for DVT, with baker's cyst noted.  D/w pt.  Reasonable to use hydrocodone for pain, with sedation caution.  Could have had OA flare in the knee, ie meniscus pathology given the pain on ROM, can defer plain imaging for now.  She'll update [place] as needed, if not better in a few days.  The main issue, DVT eval, is resolved. Routed to PCP as FYI. >25 minutes spent in face to face time with patient, >50% spent in counselling or coordination of care.

## 2015-06-14 NOTE — Telephone Encounter (Signed)
Will see today. Thanks

## 2015-06-14 NOTE — Telephone Encounter (Signed)
Pt has appt scheduled 06/14/15 at 2pm with Dr Damita Dunnings.

## 2015-06-14 NOTE — Progress Notes (Signed)
Pre visit review using our clinic review tool, if applicable. No additional management support is needed unless otherwise documented below in the visit note.  No personal h/o DVT but sister with h/o DVT.   L knee pain started in the middle of last night.   Posterior pain.  Worse at the day went on.  Pain with walking.  No trauma.  Pain with ROM.  Can bear weight with a limp.  No falls. No trigger. L foot feels normal.  R leg and knee feel normal.  No fevers.  Still on ticlopidine.  No bruising recently at L knee, some bruising on dorsum of hands B.    Meds, vitals, and allergies reviewed.   ROS: See HPI.  Otherwise, noncontributory.  nad ncat Mmm Neck supple, no LA rrr ctab abd soft  Ext w/o edema B calf circumference 37cm L knee with pain on ROM but not ttp med/lat mal. Is ttp in the popliteal fossa w/o bruising Distally NV intact

## 2015-06-14 NOTE — Telephone Encounter (Signed)
Patient Name: Margaret Vazquez DOB: 08-23-1935 Initial Comment Caller States back of knee is hurting, feels like pressure in calf, barely walk or bend it. Nurse Assessment Nurse: Marcelline Deist, RN, Lynda Date/Time (Eastern Time): 06/14/2015 12:04:19 PM Confirm and document reason for call. If symptomatic, describe symptoms. ---Caller states back of the left knee is hurting, feels like pressure in calf, barely walk or bend it. Nothing visible. It woke her up in the middle of the night. Has the patient traveled out of the country within the last 30 days? ---Not Applicable Does the patient have any new or worsening symptoms? ---Yes Will a triage be completed? ---Yes Related visit to physician within the last 2 weeks? ---No Does the PT have any chronic conditions? (i.e. diabetes, asthma, etc.) ---Yes List chronic conditions. ---stroke hx, high BP, heart monitor, diabetic, A fib, on blood thinner Is this a behavioral health or substance abuse call? ---No Guidelines Guideline Title Affirmed Question Affirmed Notes Leg Pain [1] Thigh or calf pain AND [2] only 1 side AND [3] present > 1 hour Final Disposition User See Physician within 4 Hours (or PCP triage) Marcelline Deist, RN, Lynda Referrals REFERRED TO PCP OFFICE Disagree/Comply: Comply

## 2015-06-22 ENCOUNTER — Ambulatory Visit (INDEPENDENT_AMBULATORY_CARE_PROVIDER_SITE_OTHER): Payer: PPO | Admitting: *Deleted

## 2015-06-22 DIAGNOSIS — I639 Cerebral infarction, unspecified: Secondary | ICD-10-CM | POA: Diagnosis not present

## 2015-06-22 NOTE — Progress Notes (Signed)
Carelink Summary Report / Loop Recorder 

## 2015-06-25 LAB — CUP PACEART REMOTE DEVICE CHECK: Date Time Interrogation Session: 20161116083645

## 2015-07-23 ENCOUNTER — Ambulatory Visit (INDEPENDENT_AMBULATORY_CARE_PROVIDER_SITE_OTHER): Payer: PPO | Admitting: *Deleted

## 2015-07-23 DIAGNOSIS — I639 Cerebral infarction, unspecified: Secondary | ICD-10-CM

## 2015-07-24 NOTE — Progress Notes (Signed)
Carelink Summary Report / Loop Recorder 

## 2015-08-10 LAB — CUP PACEART REMOTE DEVICE CHECK: Date Time Interrogation Session: 20161216090556

## 2015-08-12 ENCOUNTER — Telehealth: Payer: Self-pay | Admitting: Family Medicine

## 2015-08-12 DIAGNOSIS — E1159 Type 2 diabetes mellitus with other circulatory complications: Secondary | ICD-10-CM

## 2015-08-12 DIAGNOSIS — I10 Essential (primary) hypertension: Secondary | ICD-10-CM

## 2015-08-12 DIAGNOSIS — E119 Type 2 diabetes mellitus without complications: Secondary | ICD-10-CM

## 2015-08-12 DIAGNOSIS — E785 Hyperlipidemia, unspecified: Secondary | ICD-10-CM

## 2015-08-12 DIAGNOSIS — E538 Deficiency of other specified B group vitamins: Secondary | ICD-10-CM

## 2015-08-12 NOTE — Telephone Encounter (Signed)
-----   Message from Marchia Bond sent at 08/09/2015  9:57 AM EST ----- Regarding: Cpx labs Thurs 2/9, need orders. Thanks! :-) Please order  future cpx labs for pt's upcoming lab appt. Thanks Aniceto Boss

## 2015-08-13 ENCOUNTER — Ambulatory Visit (INDEPENDENT_AMBULATORY_CARE_PROVIDER_SITE_OTHER): Payer: PPO | Admitting: Family Medicine

## 2015-08-13 ENCOUNTER — Ambulatory Visit (INDEPENDENT_AMBULATORY_CARE_PROVIDER_SITE_OTHER)
Admission: RE | Admit: 2015-08-13 | Discharge: 2015-08-13 | Disposition: A | Discharge status: Home / Self Care | Payer: PPO | Source: Ambulatory Visit | Attending: Family Medicine | Admitting: Family Medicine

## 2015-08-13 ENCOUNTER — Encounter: Payer: Self-pay | Admitting: Family Medicine

## 2015-08-13 VITALS — BP 118/66 | HR 60 | Temp 98.2°F | Ht 69.0 in | Wt 203.8 lb

## 2015-08-13 DIAGNOSIS — R0789 Other chest pain: Secondary | ICD-10-CM

## 2015-08-13 DIAGNOSIS — W19XXXA Unspecified fall, initial encounter: Secondary | ICD-10-CM | POA: Diagnosis not present

## 2015-08-13 DIAGNOSIS — M25572 Pain in left ankle and joints of left foot: Secondary | ICD-10-CM

## 2015-08-13 DIAGNOSIS — M19072 Primary osteoarthritis, left ankle and foot: Secondary | ICD-10-CM | POA: Diagnosis not present

## 2015-08-13 DIAGNOSIS — R0781 Pleurodynia: Secondary | ICD-10-CM | POA: Diagnosis not present

## 2015-08-13 NOTE — Assessment & Plan Note (Signed)
L ankle and foot pain after a fall- suspect an inversion injury  Pain to invert foot- lateral tenderness of foot and ankle Sprain vs fx Xray now  inst -RICE  Use walking assist  Pend radiol reading

## 2015-08-13 NOTE — Patient Instructions (Signed)
Take good deep breaths frequently to prevent pneumonia  If you develop cough or fever let us know  Try using a warm compress on your chest for 10 minutes at a time  Xray of ribs today   Xray of ankle and foot today to rule out fracture Use ice on your ankle and foot and back of your knee as often as possible for 10 minutes at a time as often as usual  Elevate your leg as often as you can also  Tylenol (acetaminophen) is ok as directed   Go ahead and try the ace bandage on ankle and foot when you go home for use when not lying down    We will make a plan when the radiology reading comes back later today

## 2015-08-13 NOTE — Progress Notes (Signed)
Pre visit review using our clinic review tool, if applicable. No additional management support is needed unless otherwise documented below in the visit note. 

## 2015-08-13 NOTE — Assessment & Plan Note (Signed)
After a fall carrying a can of seed on R side  Chest wall contusion  xr ribs to r/o fx -pending result  Disc imp of deep breaths to prevent atelectasis  Will update if cough or fever  Use warm compress prn

## 2015-08-13 NOTE — Progress Notes (Signed)
Subjective:    Patient ID: Margaret Vazquez, female    DOB: 12/11/1935, 80 y.o.   MRN: [id number]  HPI Here for injuries from a fall   She tripped over a board when she was filling the bird feeder  Holding a can over her R chest and fell on it  [place] on L leg/knee  Happened last Wednesday   Her chest wall is sore on the R   L leg- hurts over lateral ankle and foot and shoots up into her lower leg  It did swell- esp the lateral foot  If bruised? -- around ankle that faded  Hurts most to walk and put weight on it  Hurts more to dorsiflex than plantar flex   She was able to get up by herself and hobble  She has been able to walk with a limp  Knee feels like "pressure" is in it-but has a bakers cyst (found on [place] prev)   Dr Leonie Man did not want her bp over  [id number] systolic  BP Readings from Last 3 Encounters:  08/13/15 118/66  06/14/15 122/60  05/02/15 142/74     Patient Active Problem List   Diagnosis Date Noted  . Fall 08/13/2015  . Chest wall pain 08/13/2015  . Left ankle pain 08/13/2015  . Left knee pain 06/14/2015  . Chest discomfort 11/22/2014  . Obesity 08/23/2014  . Type 2 diabetes mellitus with vascular disease ([place]) 01/12/2014  . TIA (transient ischemic attack) 01/11/2014  . CVA (cerebral infarction) 01/11/2014  . Sensory disturbance 01/11/2014  . Pain of right thumb 11/14/2013  . Left hip pain 11/14/2013  . Chest pain at rest 08/31/2013  . LOOP Recorder LINQ 06/07/2013  . Fatigue 06/07/2013  . Sinusitis, chronic 05/16/2013  . Acute ischemic stroke ([place]) 02/15/2013  . Numbness and tingling of right arm 02/14/2013  . Encounter for Medicare annual wellness exam 10/27/2012  . Encopresis(307.7) 12/15/2011  . Post-menopausal 10/28/2011  . PERSONAL HX COLONIC POLYPS 09/04/2009  . B12 deficiency 03/06/2009  . UNSPECIFIED ANEMIA 02/13/2009  . ALLERGIC RHINITIS 02/14/2008  . BACK PAIN, LUMBAR 11/03/2007  . Hyperlipidemia 11/24/2006  . Essential hypertension  11/24/2006  . Diabetes type 2, controlled ([place]) 10/09/2006  . GERD 10/09/2006  . OVERACTIVE BLADDER 10/09/2006  . INCONTINENCE, URGE 10/09/2006  . SKIN CANCER, HX OF 10/09/2006   Past Medical History  Diagnosis Date  . Hypertension   . Diabetes mellitus     type II  . Hyperlipidemia     myalgias with Lipitor and Zetia  . GERD (gastroesophageal reflux disease)   . Skin cancer     hx of basal cell/ak's  . Colon polyps 2009  . Allergy history, drug     Aspirin  . Cervical stenosis of spine     With neck pain  . CAD (coronary artery disease) 04/08/11    non obst by cath  . Migraine   . TIA (transient ischemic attack)   . Stroke [facility]) August 2014   Past Surgical History  Procedure Laterality Date  . Appendectomy    . Tubal ligation      BTL  . Breast surgery  1992    breast biopsy  . Eye surgery  2005    tear duct surgery  . Nasal sinus surgery  01/2005  . Cardiac catheterization  1993    LHC: normal coronaries.   . Stress cardiolite  11/1999    Normal/ negative  . Cystoscopy w/ decannulation  03/2000  Normal  . Abd [place]  07/2003    Negative  . Tear duct surgery  2005  . Colonoscopy  12/2007    Adenomatous colon polyps  . Cardiac catheterization  04/07/2011    non obst CAD (Dr Burt Knack)  . Tee without cardioversion N/A 02/16/2013    Procedure: TRANSESOPHAGEAL ECHOCARDIOGRAM (TEE);  Surgeon: Josue Hector, MD;  Location: [facility] ENDOSCOPY;  Service: Cardiovascular;  Laterality: N/A;  . Loop recorder implant    . Loop recorder implant N/A 02/16/2013    Procedure: LOOP RECORDER IMPLANT;  Surgeon: Deboraha Sprang, MD;  Location: [facility] CATH LAB;  Service: Cardiovascular;  Laterality: N/A;   Social History  Substance Use Topics  . Smoking status: Never Smoker   . Smokeless tobacco: Never Used  . Alcohol Use: No   Family History  Problem Relation Age of Onset  . Lung cancer Brother   . Colon cancer Neg Hx   . Skin cancer Daughter   . Diabetes Sister   . Diabetes Brother     . Pancreatic cancer Brother   . Heart disease Mother   . Heart disease Father   . Brain cancer Other    Allergies  Allergen Reactions  . Bee Venom Hives, Shortness Of Breath and Swelling  . Nabumetone Anaphylaxis  . Amoxicillin-Pot Clavulanate Hives and Swelling    To lips.  . Aspirin Hives  . Atorvastatin Swelling     joint pain/swelling, inc liver tests  . Clopidogrel Bisulfate Hives  . Codeine Nausea And Vomiting  . Ezetimibe Other (See Comments)     fatigue  . Metformin And Related Other (See Comments)    Diarrhea   . Valsartan Other (See Comments)     fatigue   Current Outpatient Prescriptions on File Prior to Visit  Medication Sig Dispense Refill  . diphenhydrAMINE (BENADRYL) 25 MG tablet Take 50 mg by mouth every 8 (eight) hours as needed for allergies.     Marland Kitchen EPINEPHrine (EPI-PEN) 0.3 mg/0.3 mL DEVI Inject 0.3 mg into the muscle daily as needed (allergic reaction).     . fluticasone (FLONASE) 50 MCG/ACT nasal spray Place 2 sprays into both nostrils 2 (two) times daily as needed.     Marland Kitchen glipiZIDE (GLUCOTROL XL) 10 MG 24 hr tablet Take 1 tablet (10 mg total) by mouth daily with breakfast. 30 tablet 11  . losartan-hydrochlorothiazide (HYZAAR) 100-25 MG per tablet Take 1 tablet by mouth daily. 30 tablet 11  . ticlopidine (TICLID) 250 MG tablet Take 1 tablet (250 mg total) by mouth 2 (two) times daily with a meal. 180 tablet 0  . [DISCONTINUED] levETIRAcetam (KEPPRA) 250 MG tablet Take 1 tablet (250 mg total) by mouth 2 (two) times daily. 60 tablet 0   No current facility-administered medications on file prior to visit.    Review of Systems Review of Systems  Constitutional: Negative for fever, appetite change, fatigue and unexpected weight change.  Eyes: Negative for pain and visual disturbance.  Respiratory: Negative for cough and shortness of breath.  pos for CW pain  Cardiovascular: Negative for cp or palpitations    Gastrointestinal: Negative for nausea, diarrhea and  constipation.  Genitourinary: Negative for urgency and frequency.  Skin: Negative for pallor or rash   MSK pos for L ankle and foot pain , pos for bakers cyst behind L knee Neurological: Negative for weakness, light-headedness, numbness and headaches.  Hematological: Negative for adenopathy. Does not bruise/bleed easily.  Psychiatric/Behavioral: Negative for dysphoric mood. The patient is not  nervous/anxious.         Objective:   Physical Exam  Constitutional: She appears well-developed and well-nourished. No distress.  obese and well appearing   HENT:  Head: Normocephalic and atraumatic.  Mouth/Throat: Oropharynx is clear and moist.  Eyes: Conjunctivae and EOM are normal. Pupils are equal, round, and reactive to light.  Neck: Normal range of motion. Neck supple. No JVD present. Carotid bruit is not present. No thyromegaly present.  Cardiovascular: Normal rate, regular rhythm, normal heart sounds and intact distal pulses.  Exam reveals no gallop.   Pulmonary/Chest: Effort normal and breath sounds normal. No respiratory distress. She has no wheezes. She has no rales. She exhibits tenderness.  No crackles  R anterior CW tenderness w/o crepitus or step off noted  Nl bs in that area  No bruising   Abdominal: Soft. Bowel sounds are normal. She exhibits no distension, no abdominal bruit and no mass. There is no tenderness.  Musculoskeletal: She exhibits edema and tenderness.       Left ankle: She exhibits decreased range of motion, swelling and ecchymosis. She exhibits no deformity, no laceration and normal pulse. Tenderness. Lateral malleolus, AITFL, posterior TFL and head of 5th metatarsal tenderness found. No proximal fibula tenderness found. Achilles tendon normal.       Left foot: There is decreased range of motion, tenderness and bony tenderness. There is no crepitus, no deformity and no laceration.  Diffuse swelling (mild) over lateral L ankle and foot  Old ecchymosis  No neuro  changes Favors R leg with gait but can bear wt   Mild tenderness behind L knee consistent with bakers cyst with nl rom    Lymphadenopathy:    She has no cervical adenopathy.  Neurological: She is alert. She has normal reflexes. She displays no atrophy and no tremor. No sensory deficit. She exhibits normal muscle tone. Coordination normal.  Skin: Skin is warm and dry. No rash noted.  Psychiatric: She has a normal mood and affect.          Assessment & Plan:   Problem List Items Addressed This Visit      Other   Chest wall pain    After a fall carrying a can of seed on R side  Chest wall contusion  xr ribs to r/o fx -pending result  Disc imp of deep breaths to prevent atelectasis  Will update if cough or fever  Use warm compress prn       Relevant Orders   DG Ribs Unilateral Right (Completed)   Fall - Primary    Disc fall risk and fall prevention in the home  This occurred when she tripped over an object her husband did not put away They disc this  Also made a point that she needs eyes ahead at all times Info given      Left ankle pain    L ankle and foot pain after a fall- suspect an inversion injury  Pain to invert foot- lateral tenderness of foot and ankle Sprain vs fx Xray now  inst -RICE  Use walking assist  Pend radiol reading       Relevant Orders   DG Foot Complete Left (Completed)   DG Ankle Complete Left (Completed)

## 2015-08-13 NOTE — Assessment & Plan Note (Signed)
Disc fall risk and fall prevention in the home  This occurred when she tripped over an object her husband did not put away They disc this  Also made a point that she needs eyes ahead at all times Info given

## 2015-08-16 ENCOUNTER — Other Ambulatory Visit (INDEPENDENT_AMBULATORY_CARE_PROVIDER_SITE_OTHER): Payer: PPO

## 2015-08-16 DIAGNOSIS — E119 Type 2 diabetes mellitus without complications: Secondary | ICD-10-CM | POA: Diagnosis not present

## 2015-08-16 DIAGNOSIS — E538 Deficiency of other specified B group vitamins: Secondary | ICD-10-CM | POA: Diagnosis not present

## 2015-08-16 DIAGNOSIS — I10 Essential (primary) hypertension: Secondary | ICD-10-CM

## 2015-08-16 DIAGNOSIS — E785 Hyperlipidemia, unspecified: Secondary | ICD-10-CM

## 2015-08-16 LAB — LIPID PANEL
Cholesterol: 201 mg/dL — ABNORMAL HIGH (ref 0–200)
HDL: 55 mg/dL (ref >39.00)
LDL Cholesterol: 120 mg/dL — ABNORMAL HIGH (ref 0–99)
NonHDL: 146.3
Total CHOL/HDL Ratio: 4
Triglycerides: 130 mg/dL (ref 0.0–149.0)
VLDL: 26 mg/dL (ref 0.0–40.0)

## 2015-08-16 LAB — COMPREHENSIVE METABOLIC PANEL
ALT: 15 U/L (ref 0–35)
AST: 16 U/L (ref 0–37)
Albumin: 3.7 g/dL (ref 3.5–5.2)
Alkaline Phosphatase: 91 U/L (ref 39–117)
BUN: 20 mg/dL (ref 6–23)
CO2: 31 mEq/L (ref 19–32)
Calcium: 9.1 mg/dL (ref 8.4–10.5)
Chloride: 103 mEq/L (ref 96–112)
Creatinine, Ser: 1.03 mg/dL (ref 0.40–1.20)
GFR: 55.1 mL/min — ABNORMAL LOW (ref >60.00)
Glucose, Bld: 141 mg/dL — ABNORMAL HIGH (ref 70–99)
Potassium: 4 mEq/L (ref 3.5–5.1)
Sodium: 139 mEq/L (ref 135–145)
Total Bilirubin: 0.3 mg/dL (ref 0.2–1.2)
Total Protein: 6.8 g/dL (ref 6.0–8.3)

## 2015-08-16 LAB — CBC WITH DIFFERENTIAL/PLATELET
Basophils Absolute: 0.1 10*3/uL (ref 0.0–0.1)
Basophils Relative: 1 % (ref 0.0–3.0)
Eosinophils Absolute: 0.2 10*3/uL (ref 0.0–0.7)
Eosinophils Relative: 3.2 % (ref 0.0–5.0)
HCT: 39.9 % (ref 36.0–46.0)
Hemoglobin: 13.4 g/dL (ref 12.0–15.0)
Lymphocytes Relative: 40.9 % (ref 12.0–46.0)
Lymphs Abs: 2.5 10*3/uL (ref 0.7–4.0)
MCHC: 33.5 g/dL (ref 30.0–36.0)
MCV: 95.3 fl (ref 78.0–100.0)
Monocytes Absolute: 0.5 10*3/uL (ref 0.1–1.0)
Monocytes Relative: 7.3 % (ref 3.0–12.0)
Neutro Abs: 2.9 10*3/uL (ref 1.4–7.7)
Neutrophils Relative %: 47.6 % (ref 43.0–77.0)
Platelets: 226 10*3/uL (ref 150.0–400.0)
RBC: 4.19 Mil/uL (ref 3.87–5.11)
RDW: 13.5 % (ref 11.5–15.5)
WBC: 6.2 10*3/uL (ref 4.0–10.5)

## 2015-08-16 LAB — HEMOGLOBIN A1C: Hgb A1c MFr Bld: 6.9 % — ABNORMAL HIGH (ref 4.6–6.5)

## 2015-08-16 LAB — VITAMIN B12: Vitamin B-12: 234 pg/mL (ref 211–911)

## 2015-08-16 LAB — TSH: TSH: 4.18 u[IU]/mL (ref 0.35–4.50)

## 2015-08-21 ENCOUNTER — Ambulatory Visit (INDEPENDENT_AMBULATORY_CARE_PROVIDER_SITE_OTHER): Payer: PPO | Admitting: *Deleted

## 2015-08-21 DIAGNOSIS — I639 Cerebral infarction, unspecified: Secondary | ICD-10-CM | POA: Diagnosis not present

## 2015-08-21 NOTE — Progress Notes (Signed)
Carelink Summary Report / Loop Recorder 

## 2015-08-22 ENCOUNTER — Encounter: Payer: Self-pay | Admitting: Family Medicine

## 2015-08-22 ENCOUNTER — Ambulatory Visit (INDEPENDENT_AMBULATORY_CARE_PROVIDER_SITE_OTHER): Payer: PPO | Admitting: Family Medicine

## 2015-08-22 VITALS — BP 128/60 | HR 62 | Temp 98.2°F | Ht 68.5 in | Wt 202.8 lb

## 2015-08-22 DIAGNOSIS — E669 Obesity, unspecified: Secondary | ICD-10-CM

## 2015-08-22 DIAGNOSIS — Z23 Encounter for immunization: Secondary | ICD-10-CM | POA: Diagnosis not present

## 2015-08-22 DIAGNOSIS — E785 Hyperlipidemia, unspecified: Secondary | ICD-10-CM | POA: Diagnosis not present

## 2015-08-22 DIAGNOSIS — E538 Deficiency of other specified B group vitamins: Secondary | ICD-10-CM

## 2015-08-22 DIAGNOSIS — E1159 Type 2 diabetes mellitus with other circulatory complications: Secondary | ICD-10-CM | POA: Diagnosis not present

## 2015-08-22 DIAGNOSIS — I10 Essential (primary) hypertension: Secondary | ICD-10-CM

## 2015-08-22 DIAGNOSIS — Z0000 Encounter for general adult medical examination without abnormal findings: Secondary | ICD-10-CM | POA: Diagnosis not present

## 2015-08-22 MED ORDER — CYANOCOBALAMIN 1000 MCG/ML IJ SOLN
1000.0000 ug | Freq: Once | INTRAMUSCULAR | Status: AC
  Administered 2015-08-22: 1000 ug via INTRAMUSCULAR

## 2015-08-22 MED ORDER — GLIPIZIDE ER 10 MG PO TB24: 10.0000 mg | ORAL_TABLET | Freq: Every day | ORAL | Status: DC

## 2015-08-22 NOTE — Patient Instructions (Addendum)
prevnar vaccine today (pneumonia vaccine booster) Schedule your annual mammogram when you are ready Try to get 1200-1500 mg of calcium per day with at least 1000 iu of vitamin D - for bone health  Please work on your Advance Directive-the blue booklet I gave you  B12 shot today- then every 6-8 weeks (your level is low)  Follow up in 3 months with labs prior   You do need a flu shot - we are out of them until this afternoon if you want to return Or get one at a pharmacy

## 2015-08-22 NOTE — Progress Notes (Signed)
Pre visit review using our clinic review tool, if applicable. No additional management support is needed unless otherwise documented below in the visit note. 

## 2015-08-22 NOTE — Progress Notes (Signed)
Subjective:    Patient ID: Margaret Vazquez, female    DOB: January 30, 1936, 80 y.o.   MRN: [id number]  HPI  Here for annual medicare wellness visit as well as chronic/acute medical problems   I have personally reviewed the Medicare Annual Wellness questionnaire and have noted 1. The patient's medical and social history 2. Their use of alcohol, tobacco or illicit drugs 3. Their current medications and supplements 4. The patient's functional ability including ADL's, fall risks, home safety risks and hearing or visual             impairment. 5. Diet and physical activities 6. Evidence for depression or mood disorders  The patients weight, height, BMI have been recorded in the chart and visual acuity is per eye clinic.  I have made referrals, counseling and provided education to the patient based review of the above and I have provided the pt with a written personalized care plan for preventive services. Reviewed and updated provider list, see scanned forms.  See scanned forms.  Routine anticipatory guidance given to patient.  See health maintenance. Colon cancer screening colonoscopy 9/13 - 5 year recall from colon polyps  Breast cancer screening mm 2/16 - time for that  Self breast exam- no new lumps or changes - is slowly improving (pain) from recent fall  Flu vaccine -is due for  Tetanus vaccine 4/13 Pneumovax - needs the prevnar  Zoster vaccine- declines  dexa 5/13 - normal  Had a fall with no fractures No ca or D  Advance directive does not have a living will or POA - given blue booklet for this  Cognitive function addressed- see scanned forms- and if abnormal then additional documentation follows. Very sharp/ no problems except for occ misplacing keys   PMH and SH reviewed  Meds, vitals, and allergies reviewed.   ROS: See HPI.  Otherwise negative.    Wt is stable bmi of 30 Obese Would like to loose weight  - cannot tolerate a lot  Diet is not great -since xmas - had guests  and cooked more   DM2 Lab Results  Component Value Date   HGBA1C 6.9* 08/16/2015   This is up from 6.4 Holiday eating - needs to get back on track   Hyperlipidemia Lab Results  Component Value Date   CHOL 201* 08/16/2015   CHOL 203* 11/15/2014   CHOL 154 01/10/2014   Lab Results  Component Value Date   HDL 55.00 08/16/2015   HDL 46.80 11/15/2014   HDL 72 01/10/2014   Lab Results  Component Value Date   LDLCALC 120* 08/16/2015   LDLCALC 125* 11/15/2014   LDLCALC 62 01/10/2014   Lab Results  Component Value Date   TRIG 130.0 08/16/2015   TRIG 157.0* 11/15/2014   TRIG 101 01/10/2014   Lab Results  Component Value Date   CHOLHDL 4 08/16/2015   CHOLHDL 4 11/15/2014   CHOLHDL 2.1 01/10/2014   Lab Results  Component Value Date   LDLDIRECT 136.5 10/20/2011   LDLDIRECT 134.7 08/08/2008   LDLDIRECT 143.0 04/27/2007    Some improvement in LDL and HDL   B12 is lower at 234 Hx of low B12 Wants to start getting shots   Patient Active Problem List   Diagnosis Date Noted  . Fall 08/13/2015  . Chest wall pain 08/13/2015  . Left ankle pain 08/13/2015  . Left knee pain 06/14/2015  . Chest discomfort 11/22/2014  . Obesity 08/23/2014  . Type 2 diabetes mellitus with  vascular disease ([place]) 01/12/2014  . TIA (transient ischemic attack) 01/11/2014  . CVA (cerebral infarction) 01/11/2014  . Sensory disturbance 01/11/2014  . Pain of right thumb 11/14/2013  . Left hip pain 11/14/2013  . Chest pain at rest 08/31/2013  . LOOP Recorder LINQ 06/07/2013  . Fatigue 06/07/2013  . Sinusitis, chronic 05/16/2013  . Acute ischemic stroke ([place]) 02/15/2013  . Numbness and tingling of right arm 02/14/2013  . Encounter for Medicare annual wellness exam 10/27/2012  . Encopresis(307.7) 12/15/2011  . Post-menopausal 10/28/2011  . PERSONAL HX COLONIC POLYPS 09/04/2009  . B12 deficiency 03/06/2009  . UNSPECIFIED ANEMIA 02/13/2009  . ALLERGIC RHINITIS 02/14/2008  . BACK PAIN, LUMBAR  11/03/2007  . Hyperlipidemia 11/24/2006  . Essential hypertension 11/24/2006  . Diabetes type 2, controlled ([place]) 10/09/2006  . GERD 10/09/2006  . OVERACTIVE BLADDER 10/09/2006  . INCONTINENCE, URGE 10/09/2006  . SKIN CANCER, HX OF 10/09/2006   Past Medical History  Diagnosis Date  . Hypertension   . Diabetes mellitus     type II  . Hyperlipidemia     myalgias with Lipitor and Zetia  . GERD (gastroesophageal reflux disease)   . Skin cancer     hx of basal cell/ak's  . Colon polyps 2009  . Allergy history, drug     Aspirin  . Cervical stenosis of spine     With neck pain  . CAD (coronary artery disease) 04/08/11    non obst by cath  . Migraine   . TIA (transient ischemic attack)   . Stroke [facility]) August 2014   Past Surgical History  Procedure Laterality Date  . Appendectomy    . Tubal ligation      BTL  . Breast surgery  1992    breast biopsy  . Eye surgery  2005    tear duct surgery  . Nasal sinus surgery  01/2005  . Cardiac catheterization  1993    LHC: normal coronaries.   . Stress cardiolite  11/1999    Normal/ negative  . Cystoscopy w/ decannulation  03/2000    Normal  . Abd [place]  07/2003    Negative  . Tear duct surgery  2005  . Colonoscopy  12/2007    Adenomatous colon polyps  . Cardiac catheterization  04/07/2011    non obst CAD (Dr Burt Knack)  . Tee without cardioversion N/A 02/16/2013    Procedure: TRANSESOPHAGEAL ECHOCARDIOGRAM (TEE);  Surgeon: Josue Hector, MD;  Location: [facility] ENDOSCOPY;  Service: Cardiovascular;  Laterality: N/A;  . Loop recorder implant    . Loop recorder implant N/A 02/16/2013    Procedure: LOOP RECORDER IMPLANT;  Surgeon: Deboraha Sprang, MD;  Location: [facility] CATH LAB;  Service: Cardiovascular;  Laterality: N/A;   Social History  Substance Use Topics  . Smoking status: Never Smoker   . Smokeless tobacco: Never Used  . Alcohol Use: No   Family History  Problem Relation Age of Onset  . Lung cancer Brother   . Colon cancer Neg Hx   .  Skin cancer Daughter   . Diabetes Sister   . Diabetes Brother   . Pancreatic cancer Brother   . Heart disease Mother   . Heart disease Father   . Brain cancer Other    Allergies  Allergen Reactions  . Bee Venom Hives, Shortness Of Breath and Swelling  . Nabumetone Anaphylaxis  . Amoxicillin-Pot Clavulanate Hives and Swelling    To lips.  . Aspirin Hives  . Atorvastatin Swelling  joint pain/swelling, inc liver tests  . Clopidogrel Bisulfate Hives  . Codeine Nausea And Vomiting  . Ezetimibe Other (See Comments)     fatigue  . Metformin And Related Other (See Comments)    Diarrhea   . Valsartan Other (See Comments)     fatigue   Current Outpatient Prescriptions on File Prior to Visit  Medication Sig Dispense Refill  . diphenhydrAMINE (BENADRYL) 25 MG tablet Take 50 mg by mouth every 8 (eight) hours as needed for allergies.     Marland Kitchen EPINEPHrine (EPI-PEN) 0.3 mg/0.3 mL DEVI Inject 0.3 mg into the muscle daily as needed (allergic reaction).     . fluticasone (FLONASE) 50 MCG/ACT nasal spray Place 2 sprays into both nostrils 2 (two) times daily as needed.     Marland Kitchen losartan-hydrochlorothiazide (HYZAAR) 100-25 MG per tablet Take 1 tablet by mouth daily. 30 tablet 11  . ticlopidine (TICLID) 250 MG tablet Take 1 tablet (250 mg total) by mouth 2 (two) times daily with a meal. 180 tablet 0  . [DISCONTINUED] levETIRAcetam (KEPPRA) 250 MG tablet Take 1 tablet (250 mg total) by mouth 2 (two) times daily. 60 tablet 0   No current facility-administered medications on file prior to visit.    Review of Systems Review of Systems  Constitutional: Negative for fever, appetite change, fatigue and unexpected weight change.  Eyes: Negative for pain and visual disturbance.  Respiratory: Negative for cough and shortness of breath.   Cardiovascular: Negative for cp or palpitations    Gastrointestinal: Negative for nausea, diarrhea and constipation.  Genitourinary: Negative for urgency and frequency.    Skin: Negative for pallor or rash   MSK pos for aches and pains  Neurological: Negative for weakness, light-headedness, numbness and headaches.  Hematological: Negative for adenopathy. Does not bruise/bleed easily.  Psychiatric/Behavioral: Negative for dysphoric mood. The patient is not nervous/anxious.         Objective:   Physical Exam  Constitutional: She appears well-developed and well-nourished. No distress.  obese and well appearing   HENT:  Head: Normocephalic and atraumatic.  Right Ear: External ear normal.  Left Ear: External ear normal.  Mouth/Throat: Oropharynx is clear and moist.  Eyes: Conjunctivae and EOM are normal. Pupils are equal, round, and reactive to light. No scleral icterus.  Neck: Normal range of motion. Neck supple. No JVD present. Carotid bruit is not present. No thyromegaly present.  Cardiovascular: Normal rate, regular rhythm, normal heart sounds and intact distal pulses.  Exam reveals no gallop.   Pulmonary/Chest: Effort normal and breath sounds normal. No respiratory distress. She has no wheezes. She exhibits no tenderness.  Abdominal: Soft. Bowel sounds are normal. She exhibits no distension, no abdominal bruit and no mass. There is no tenderness.  Genitourinary: No breast swelling, tenderness, discharge or bleeding.  Breast exam: No mass, nodules, thickening, bulging, retraction, inflamation, nipple discharge or skin changes noted.  No axillary or clavicular LA.     slt tenderness due to recent fall   Musculoskeletal: Normal range of motion. She exhibits no edema or tenderness.  Lymphadenopathy:    She has no cervical adenopathy.  Neurological: She is alert. She has normal reflexes. No cranial nerve deficit. She exhibits normal muscle tone. Coordination normal.  Skin: Skin is warm and dry. No rash noted. No erythema. No pallor.  Psychiatric: She has a normal mood and affect.          Assessment & Plan:   Problem List Items Addressed This Visit  Cardiovascular and Mediastinum   Essential hypertension - Primary    bp in fair control at this time  BP Readings from Last 1 Encounters:  08/22/15 128/60   No changes needed Disc lifstyle change with low sodium diet and exercise   Labs reviewed       Type 2 diabetes mellitus with vascular disease ([place])    Lab Results  Component Value Date   HGBA1C 6.9* 08/16/2015   This is up  Pt states diet was worse at and after the holidays Rev low glycemic diet and need for wt loss Lab and f/u 3 mo      Relevant Medications   glipiZIDE (GLUCOTROL XL) 10 MG 24 hr tablet     Digestive   B12 deficiency    Level is down in 200s Shot today Will get back on a schedule of shots every 6-8 wk Disc balanced diet       Relevant Medications   cyanocobalamin ((VITAMIN B-12)) injection 1,000 mcg (Completed)     Other   Encounter for Medicare annual wellness exam    Reviewed health habits including diet and exercise and skin cancer prevention Reviewed appropriate screening tests for age  Also reviewed health mt list, fam hx and immunization status , as well as social and family history   See HPI  Labs reviewed prevnar vaccine today (pneumonia vaccine booster) Schedule your annual mammogram when you are ready Try to get 1200-1500 mg of calcium per day with at least 1000 iu of vitamin D - for bone health  Please work on your Advance Directive-the blue booklet I gave you  B12 shot today- then every 6-8 weeks (your level is low)  Follow up in 3 months with labs prior   You do need a flu shot - we are out of them until this afternoon if you want to return Or get one at a pharmacy      Hyperlipidemia    Disc goals for lipids and reasons to control them Rev labs with pt Rev low sat fat diet in detail Mildly improved       Obesity    Discussed how this problem influences overall health and the risks it imposes  Reviewed plan for weight loss with lower calorie diet (via better food  choices and also portion control or program like weight watchers) and exercise building up to or more than 30 minutes 5 days per week including some aerobic activity   Unsure re: motivation of pt  Need to work on this for her DM2      Relevant Medications   glipiZIDE (GLUCOTROL XL) 10 MG 24 hr tablet    Other Visit Diagnoses    Need for vaccination with 13-polyvalent pneumococcal conjugate vaccine        Relevant Orders    Pneumococcal conjugate vaccine 13-valent (Completed)

## 2015-08-23 NOTE — Assessment & Plan Note (Signed)
bp in fair control at this time  BP Readings from Last 1 Encounters:  08/22/15 128/60   No changes needed Disc lifstyle change with low sodium diet and exercise   Labs reviewed

## 2015-08-23 NOTE — Assessment & Plan Note (Signed)
Lab Results  Component Value Date   HGBA1C 6.9* 08/16/2015   This is up  Pt states diet was worse at and after the holidays Rev low glycemic diet and need for wt loss Lab and f/u 3 mo

## 2015-08-23 NOTE — Assessment & Plan Note (Signed)
Reviewed health habits including diet and exercise and skin cancer prevention Reviewed appropriate screening tests for age  Also reviewed health mt list, fam hx and immunization status , as well as social and family history   See HPI  Labs reviewed prevnar vaccine today (pneumonia vaccine booster) Schedule your annual mammogram when you are ready Try to get 1200-1500 mg of calcium per day with at least 1000 iu of vitamin D - for bone health  Please work on your Advance Directive-the blue booklet I gave you  B12 shot today- then every 6-8 weeks (your level is low)  Follow up in 3 months with labs prior   You do need a flu shot - we are out of them until this afternoon if you want to return Or get one at a pharmacy

## 2015-08-23 NOTE — Assessment & Plan Note (Signed)
Disc goals for lipids and reasons to control them Rev labs with pt Rev low sat fat diet in detail Mildly improved

## 2015-08-23 NOTE — Assessment & Plan Note (Signed)
Level is down in 200s Shot today Will get back on a schedule of shots every 6-8 wk Disc balanced diet

## 2015-08-23 NOTE — Assessment & Plan Note (Signed)
Discussed how this problem influences overall health and the risks it imposes  Reviewed plan for weight loss with lower calorie diet (via better food choices and also portion control or program like weight watchers) and exercise building up to or more than 30 minutes 5 days per week including some aerobic activity   Unsure re: motivation of pt  Need to work on this for her DM2

## 2015-08-29 ENCOUNTER — Other Ambulatory Visit: Payer: Self-pay

## 2015-08-29 DIAGNOSIS — Z1231 Encounter for screening mammogram for malignant neoplasm of breast: Secondary | ICD-10-CM

## 2015-09-10 ENCOUNTER — Ambulatory Visit (INDEPENDENT_AMBULATORY_CARE_PROVIDER_SITE_OTHER): Payer: PPO | Admitting: Internal Medicine

## 2015-09-10 ENCOUNTER — Ambulatory Visit (INDEPENDENT_AMBULATORY_CARE_PROVIDER_SITE_OTHER)
Admission: RE | Admit: 2015-09-10 | Discharge: 2015-09-10 | Disposition: A | Discharge status: Home / Self Care | Payer: PPO | Source: Ambulatory Visit | Attending: Internal Medicine | Admitting: Internal Medicine

## 2015-09-10 ENCOUNTER — Encounter: Payer: Self-pay | Admitting: Internal Medicine

## 2015-09-10 VITALS — BP 130/74 | HR 94 | Temp 99.3°F | Wt 199.0 lb

## 2015-09-10 DIAGNOSIS — R059 Cough, unspecified: Secondary | ICD-10-CM

## 2015-09-10 DIAGNOSIS — R509 Fever, unspecified: Secondary | ICD-10-CM | POA: Diagnosis not present

## 2015-09-10 DIAGNOSIS — R05 Cough: Secondary | ICD-10-CM

## 2015-09-10 LAB — COMPREHENSIVE METABOLIC PANEL
ALT: 16 U/L (ref 0–35)
AST: 14 U/L (ref 0–37)
Albumin: 4.1 g/dL (ref 3.5–5.2)
Alkaline Phosphatase: 76 U/L (ref 39–117)
BUN: 16 mg/dL (ref 6–23)
CO2: 26 mEq/L (ref 19–32)
Calcium: 9.7 mg/dL (ref 8.4–10.5)
Chloride: 99 mEq/L (ref 96–112)
Creatinine, Ser: 0.95 mg/dL (ref 0.40–1.20)
GFR: 60.48 mL/min (ref >60.00)
Glucose, Bld: 160 mg/dL — ABNORMAL HIGH (ref 70–99)
Potassium: 3.7 mEq/L (ref 3.5–5.1)
Sodium: 136 mEq/L (ref 135–145)
Total Bilirubin: 0.5 mg/dL (ref 0.2–1.2)
Total Protein: 7.6 g/dL (ref 6.0–8.3)

## 2015-09-10 LAB — CBC
HCT: 41 % (ref 36.0–46.0)
Hemoglobin: 13.9 g/dL (ref 12.0–15.0)
MCHC: 33.9 g/dL (ref 30.0–36.0)
MCV: 94.2 fl (ref 78.0–100.0)
Platelets: 297 10*3/uL (ref 150.0–400.0)
RBC: 4.36 Mil/uL (ref 3.87–5.11)
RDW: 13.6 % (ref 11.5–15.5)
WBC: 10 10*3/uL (ref 4.0–10.5)

## 2015-09-10 LAB — CUP PACEART REMOTE DEVICE CHECK: Date Time Interrogation Session: 20170115090632

## 2015-09-10 LAB — POCT INFLUENZA A/B
Influenza A, POC: NEGATIVE
Influenza B, POC: NEGATIVE

## 2015-09-10 NOTE — Progress Notes (Signed)
HPI  Pt presents to the clinic today with c/o fatigue and cough. This started 1 week ago. She does have some nasal congestion. The cough is productive of blood tinged green mucous. She denies sore throat, ear pain or shortness of breath. She is running low grade fevers. She denies chills or body aches. She has tried Benadryl, Coricidin, Tylenol sinus and a Neti Pot without any relief. She has a history of DM2, UTD on flu and pneumonia vaccines. She has not had sick contacts that she is aware of.   Review of Systems      Past Medical History  Diagnosis Date  . Hypertension   . Diabetes mellitus     type II  . Hyperlipidemia     myalgias with Lipitor and Zetia  . GERD (gastroesophageal reflux disease)   . Skin cancer     hx of basal cell/ak's  . Colon polyps 2009  . Allergy history, drug     Aspirin  . Cervical stenosis of spine     With neck pain  . CAD (coronary artery disease) 04/08/11    non obst by cath  . Migraine   . TIA (transient ischemic attack)   . Stroke [facility]) August 2014    Family History  Problem Relation Age of Onset  . Lung cancer Brother   . Colon cancer Neg Hx   . Skin cancer Daughter   . Diabetes Sister   . Diabetes Brother   . Pancreatic cancer Brother   . Heart disease Mother   . Heart disease Father   . Brain cancer Other     Social History   Social History  . Marital Status: Married    Spouse Name: Eddie Dibbles  . Number of Children: 3  . Years of Education: 12   Occupational History  . Retired    Social History Main Topics  . Smoking status: Never Smoker   . Smokeless tobacco: Never Used  . Alcohol Use: No  . Drug Use: No  . Sexual Activity: No   Other Topics Concern  . Not on file   Social History Narrative   3 children   Does not drink caffeinated beverages   Cares for SIL with dementia    Allergies  Allergen Reactions  . Bee Venom Hives, Shortness Of Breath and Swelling  . Nabumetone Anaphylaxis  . Amoxicillin-Pot Clavulanate  Hives and Swelling    To lips.  . Aspirin Hives  . Atorvastatin Swelling     joint pain/swelling, inc liver tests  . Clopidogrel Bisulfate Hives  . Codeine Nausea And Vomiting  . Ezetimibe Other (See Comments)     fatigue  . Metformin And Related Other (See Comments)    Diarrhea   . Valsartan Other (See Comments)     fatigue     Constitutional: Positive fatigue and fever. Denies headache, abrupt weight changes.  HEENT:  Positive runny nose. Denies eye redness, eye pain, pressure behind the eyes, facial pain, nasal congestion, ear pain, ringing in the ears, wax buildup, or sore throat. Respiratory: Positive cough. Denies difficulty breathing or shortness of breath.  Cardiovascular: Denies chest pain, chest tightness, palpitations or swelling in the hands or feet.   No other specific complaints in a complete review of systems (except as listed in HPI above).  Objective:  BP 130/74 mmHg  Pulse 94  Temp(Src) 99.3 F (37.4 C) (Oral)  Wt 199 lb (90.266 kg)  SpO2 97%  Wt Readings from Last 3 Encounters:  09/10/15 199 lb (90.266 kg)  08/22/15 202 lb 12 oz (91.967 kg)  08/13/15 203 lb 12 oz (92.42 kg)    General: Appears her stated age, in NAD. HEENT: Head: normal shape and size, no sinus tenderness noted; Eyes: sclera white, no icterus, conjunctiva pink; Ears: Tm's gray and intact, normal light reflex; Throat/Mouth: Teeth present, mucosa pink and moist, no exudate noted, no lesions or ulcerations noted.  Neck: No cervical lymphadenopathy.  Cardiovascular: Normal rate and rhythm. S1,S2 noted.  No murmur, rubs or gallops noted.  Pulmonary/Chest: Normal effort and positive vesicular breath sounds. No respiratory distress. No wheezes, rales or ronchi noted.      Assessment & Plan:   Cough, fever:  Rapid flu: negative Chest xray to r/o pneumonia CBC and CMET today Get some rest and drink plenty of water Continue Tylenol as needed for fever Delsym as needed for cough  RTC as  needed or if symptoms persist, if worse, go straight to ER.

## 2015-09-10 NOTE — Patient Instructions (Signed)

## 2015-09-10 NOTE — Addendum Note (Signed)
Addended by: Lurlean Nanny on: 09/10/2015 04:11 PM   Modules accepted: Orders

## 2015-09-10 NOTE — Progress Notes (Signed)
Carelink summary report received. Battery status OK. Normal device function. No new symptom episodes, tachy episodes, brady, or pause episodes. No new AF episodes. Monthly summary reports and ROV/PRN 

## 2015-09-10 NOTE — Progress Notes (Signed)
Pre visit review using our clinic review tool, if applicable. No additional management support is needed unless otherwise documented below in the visit note. 

## 2015-09-12 LAB — CUP PACEART REMOTE DEVICE CHECK: Date Time Interrogation Session: 20170214093648

## 2015-09-12 NOTE — Progress Notes (Signed)
Carelink summary report received. Battery status OK. Normal device function. No new symptom episodes, tachy episodes, brady, or pause episodes. No new AF episodes. Monthly summary reports and ROV/PRN 

## 2015-09-17 ENCOUNTER — Ambulatory Visit
Admission: RE | Admit: 2015-09-17 | Discharge: 2015-09-17 | Disposition: A | Discharge status: Home / Self Care | Payer: PPO | Source: Ambulatory Visit

## 2015-09-17 DIAGNOSIS — Z1231 Encounter for screening mammogram for malignant neoplasm of breast: Secondary | ICD-10-CM | POA: Diagnosis not present

## 2015-09-20 ENCOUNTER — Ambulatory Visit (INDEPENDENT_AMBULATORY_CARE_PROVIDER_SITE_OTHER): Payer: PPO | Admitting: *Deleted

## 2015-09-20 DIAGNOSIS — I639 Cerebral infarction, unspecified: Secondary | ICD-10-CM

## 2015-09-20 NOTE — Progress Notes (Signed)
Carelink Summary Report / Loop Recorder 

## 2015-09-28 ENCOUNTER — Other Ambulatory Visit: Payer: Self-pay | Admitting: Family Medicine

## 2015-10-02 NOTE — Progress Notes (Signed)
Prescription renewal sign and fax to [facility] at [phone].Fax and confirm.

## 2015-10-03 ENCOUNTER — Ambulatory Visit (INDEPENDENT_AMBULATORY_CARE_PROVIDER_SITE_OTHER): Payer: PPO

## 2015-10-03 DIAGNOSIS — E538 Deficiency of other specified B group vitamins: Secondary | ICD-10-CM

## 2015-10-03 MED ORDER — CYANOCOBALAMIN 1000 MCG/ML IJ SOLN
1000.0000 ug | Freq: Once | INTRAMUSCULAR | Status: AC
  Administered 2015-10-03: 1000 ug via INTRAMUSCULAR

## 2015-10-22 ENCOUNTER — Ambulatory Visit (INDEPENDENT_AMBULATORY_CARE_PROVIDER_SITE_OTHER): Payer: PPO | Admitting: *Deleted

## 2015-10-22 DIAGNOSIS — I639 Cerebral infarction, unspecified: Secondary | ICD-10-CM

## 2015-10-22 NOTE — Progress Notes (Signed)
Carelink Summary Report / Loop Recorder 

## 2015-10-30 ENCOUNTER — Encounter: Payer: Self-pay | Admitting: Nurse Practitioner

## 2015-10-30 ENCOUNTER — Ambulatory Visit (INDEPENDENT_AMBULATORY_CARE_PROVIDER_SITE_OTHER): Payer: PPO | Admitting: Nurse Practitioner

## 2015-10-30 VITALS — BP 145/77 | HR 63 | Ht 68.5 in | Wt 202.8 lb

## 2015-10-30 DIAGNOSIS — I10 Essential (primary) hypertension: Secondary | ICD-10-CM

## 2015-10-30 DIAGNOSIS — G458 Other transient cerebral ischemic attacks and related syndromes: Secondary | ICD-10-CM | POA: Diagnosis not present

## 2015-10-30 DIAGNOSIS — I639 Cerebral infarction, unspecified: Secondary | ICD-10-CM | POA: Diagnosis not present

## 2015-10-30 NOTE — Progress Notes (Signed)
[facility]  PATIENT: Margaret Vazquez DOB: [birth year]   REASON FOR VISIT: Follow-up for ischemic stroke, hypertension diabetes and hyperlipidemia HISTORY FROM: Patient    HISTORY OF PRESENT ILLNESS:05/11/13 (PS): Margaret Vazquez is a 49 year Caucasian lady who is seen for followup of recent hospital consults in August and October 2014. She was initially seen on 02/16/13 and when she was admitted for evaluation for multiple episodes of transient right arm numbness and tingling. Her symptoms got worse on 02/14/13 and she had difficulty eating with a fork as well as writing. MRI scan the brain showed multifocal infarcts in the left frontal and parietal MCA territory an MRI of the brain showed severe proximal left M1 stenosis. Transthoracic echocardiogram showed normal ejection fraction. Trans esophageal echocardiogram showed no cardiac source of embolism or PFO. Hemoglobin A1c was 6.5%. Total cholesterol was 172, triglycerides 161, HDL 41 and LDL 99 mg percent. She was started on aspirin and Plavix for stroke prevention and advised aggressive risk factor control. She also had a loop recorder implanted for atrial fibrillation detection. She subsequently returned on 04/25/39 and with transient episode of right upper extremity no weakness in addition to stiffening and twisting of her right arm and inability to control it. She was evaluated for possible sensory seizures with EEG, which was negative but she was started on low-dose Keppra 250 twice a day. She states she had trouble tolerating Keppra and developed itching as well as rash and was seen on the emergency room on 05/05/13 where Keppra was discontinued and she was switched to gabapentin. She always states that she is having trouble tolerating it and complains of sleepiness on it even though she is taking only 1 tablet daily. She had a CT head on 1019/14 which was stable. She complains of tiredness and decreased stamina. She's noticed that  she still has some diminished fine motor skills in the right hand. She states her blood pressure and sugars have been under good control. She remains on ticlopidine for secondary stroke prevention/she has an aspirin allergy and complained of feeling lethargic while taking Plavix in the past. She has seen me in the past for cervicogenic muscle tension headaches and tremors and was last in the office in 2013. UPDATE 02/22/14 (LL): Since last visit Margaret. Vazquez was hospitalized in July 2015 for concern of stroke. She had 4 separate episodes of her left hand drawing up and decided to proceed to the emergency room. Episodes are similar to what she had in the right hand last year. MRI was negative for acute infarct. EEG was ordered in the hospital but not completed. She's had 2 similar episodes since leaving the hospital, on the left side also. Her blood pressure is been under good control it is 115/65 in the office today. She states that her cholesterol is under good control, and indeed it is below goal on no statin medication. She's tolerating Ticlid well without significant bleeding or bruising. She has no other complaints. Update 11/01/2014 PS: she returns for follow-up after last visit in August 2015. She is doing better now and has not had any abnormal involuntary movements or seizure-like jerking activity. She was unable to tolerate Keppra and gabapentin in the past but these episodes. She had prolonged EEG done after last visit which have personally reviewed and showed no evidence of epileptiform activity. She continues to have mild diminished fine motor skills in the right hand and some weakness but it is unchanged. Her sister had a stroke  3 weeks ago. She has some mild intermittent tingling in her feet likely from her diabetic neuropathy but this is not bothersome. She states her blood pressure is well controlled. Her fasting sugars usually range in the 110 range and last hemoglobin A1c is slightly elevated at  7.3. She plans to see her primary physician next week and will have lipid profile checked. She has a loop recorder in place and so far atrial fibrillation has not been discovered. She is tolerating Ticlid well without any side effects. She has not had any repeat Doppler studies done for the brain and neck in more than a year. UPDATE 10/25/2016CM. Margaret Vazquez, 80 year old female returns for stroke follow-up.she has risk factors of hypertension , hyperlipidemia and diabetes. She had an episode of of her left hand drawing [place] She went to a local urgent care and her shoulder was injected with relief. She has not had further problems.She continues to have mild diminished fine motor skills in the right hand but this is unchanged. She claims her hemoglobin A1c was 6.3 at last check. She continues to have a loop recorder in place no atrial fibrillation has not been discovered. She is on Ticlid for secondary stroke prevention and gets this from [place].Carotid Doppler study after last visit on 11/29/14 Consistent with 50-69% reduction in the right proximal ICA.She returns for reevaluation. Last lipid panel 11/15/2014 with cholesterol 203, triglycerides 157, and LDL 125. She returns for reevaluation UPDATE 10/30/2015 CMMs. Margaret Vazquez, 80 year old female returns for stroke follow up with risk factors of hypertension hyperlipidemia and diabetes. She denies any episodes of her left hand drawing. She continues to have mild diminished fine motor skills in the right hand that this is unchanged most recent hemoglobin A1c 6.9. She continues to have loop recorder in place without atrial fibrillation being discovered. She is on Ticlid for secondary stroke prevention. She has an  allergy to aspirin and did not feel well on Plavix. Last Doppler study consistent with 50-69% reduction in right proximal ICA last LDL 120 this was in February. She complains of some blurred vision but has not  seen an ophthalmologist in over a year. Recently found to have low vitamin B12 level and is currently receiving injections She returns for reevaluation. Remains active continues to drive etc.  REVIEW OF SYSTEMS: Full 14 system review of systems performed and notable only for those listed, all others are neg:  Constitutional: Fatigue Cardiovascular: neg Ear/Nose/Throat: neg  Skin: neg Eyes: neg Respiratory: neg Gastroitestinal: neg  Hematology/Lymphatic: neg  Endocrine: neg Musculoskeletal:neg Allergy/Immunology: neg Neurological: neg Psychiatric: neg Sleep : neg   ALLERGIES: Allergies  Allergen Reactions  . Bee Venom Hives, Shortness Of Breath and Swelling  . Nabumetone Anaphylaxis  . Amoxicillin-Pot Clavulanate Hives and Swelling    To lips.  . Aspirin Hives  . Atorvastatin Swelling     joint pain/swelling, inc liver tests  . Clopidogrel Bisulfate Hives  . Codeine Nausea And Vomiting  . Ezetimibe Other (See Comments)     fatigue  . Metformin And Related Other (See Comments)    Diarrhea   . Valsartan Other (See Comments)     fatigue    HOME MEDICATIONS: Outpatient Prescriptions Prior to Visit  Medication Sig Dispense Refill  . diphenhydrAMINE (BENADRYL) 25 MG tablet Take 50 mg by mouth every 8 (eight) hours as needed for allergies.     Marland Kitchen EPINEPHrine (EPI-PEN) 0.3 mg/0.3 mL DEVI Inject 0.3 mg into the  muscle daily as needed (allergic reaction).     . fluticasone (FLONASE) 50 MCG/ACT nasal spray Place 2 sprays into both nostrils 2 (two) times daily as needed. Reported on 09/10/2015    . glipiZIDE (GLUCOTROL XL) 10 MG 24 hr tablet Take 1 tablet (10 mg total) by mouth daily with breakfast. 30 tablet 11  . glucose blood (ONE TOUCH ULTRA TEST) test strip CHECK BLOOD SUGAR ONCE DAILY AND AS NEEDED (DX. E11.9) 100 each 0  . losartan-hydrochlorothiazide (HYZAAR) 100-25 MG tablet TAKE ONE TABLET BY MOUTH EVERY DAY 30 tablet 1  . ticlopidine (TICLID) 250 MG tablet Take 1 tablet  (250 mg total) by mouth 2 (two) times daily with a meal. 180 tablet 0   No facility-administered medications prior to visit.    PAST MEDICAL HISTORY: Past Medical History  Diagnosis Date  . Hypertension   . Diabetes mellitus     type II  . Hyperlipidemia     myalgias with Lipitor and Zetia  . GERD (gastroesophageal reflux disease)   . Skin cancer     hx of basal cell/ak's  . Colon polyps 2009  . Allergy history, drug     Aspirin  . Cervical stenosis of spine     With neck pain  . CAD (coronary artery disease) 04/08/11    non obst by cath  . Migraine   . TIA (transient ischemic attack)   . Stroke [facility]) August 2014    PAST SURGICAL HISTORY: Past Surgical History  Procedure Laterality Date  . Appendectomy    . Tubal ligation      BTL  . Breast surgery  1992    breast biopsy  . Eye surgery  2005    tear duct surgery  . Nasal sinus surgery  01/2005  . Cardiac catheterization  1993    LHC: normal coronaries.   . Stress cardiolite  11/1999    Normal/ negative  . Cystoscopy w/ decannulation  03/2000    Normal  . Abd [place]  07/2003    Negative  . Tear duct surgery  2005  . Colonoscopy  12/2007    Adenomatous colon polyps  . Cardiac catheterization  04/07/2011    non obst CAD (Dr Burt Knack)  . Tee without cardioversion N/A 02/16/2013    Procedure: TRANSESOPHAGEAL ECHOCARDIOGRAM (TEE);  Surgeon: Josue Hector, MD;  [facility] END[facility]COPY;  Service: Cardiovascular;  Laterality: N/A;  . Loop recorder implant    . Loop recorder implant N/A 02/16/2013    Procedure: LOOP RECORDER IMPLANT;  Surgeon: Deboraha Sprang, MD;  Location: [facility] CATH LAB;  Service: Cardiovascular;  Laterality: N/A;    FAMILY HISTORY: Family History  Problem Relation Age of Onset  . Lung cancer Brother   . Colon cancer Neg Hx   . Skin cancer Daughter   . Diabetes Sister   . Diabetes Brother   . Pancreatic cancer Brother   . Heart disease Mother   . Heart disease Father   . Brain cancer Other      SOCIAL HISTORY: Social History   Social History  . Marital Status: Married    Spouse Name: Eddie Dibbles  . Number of Children: 3  . Years of Education: 12   Occupational History  . Retired    Social History Main Topics  . Smoking status: Never Smoker   . Smokeless tobacco: Never Used  . Alcohol Use: No  . Drug Use: No  . Sexual Activity: No   Other Topics Concern  .  Not on file   Social History Narrative   3 children   Does not drink caffeinated beverages   Cares for SIL with dementia     PHYSICAL EXAM  Filed Vitals:   10/30/15 1055  BP: 145/77  Pulse: 63  Height: 5' 8.5" (1.74 m)  Weight: 202 lb 12.8 oz (91.989 kg)   Body mass index is 30.38 kg/(m^2). General: well developed, well nourished elderly Caucasian lady, seated, in no evident distress, well-groomed  Head: head normocephalic and atraumatic.  Neck: supple with no carotid  bruits  Cardiovascular: regular rate and rhythm, no murmurs  Musculoskeletal: no deformity  Skin: no rash/petichiae  Vascular: Normal pulses all extremities   Neurologic Exam  Mental Status: Awake and fully alert. Oriented to place and time. Recent and remote memory intact. Attention span, concentration and fund of knowledge appropriate. Mood and affect appropriate.  Cranial Nerves: Visual acuity 20/70 right 20/100 left .  Pupils equal, briskly reactive to light. Extraocular movements full without nystagmus. Visual fields full to confrontation. Hearing intact. Facial sensation intact. Face, tongue, palate moves normally and symmetrically.  Motor: Normal bulk and tone. Normal strength in all tested extremity muscles. Diminished fine finger movements on the right. Orbits left-to-right upper extremity. Minimal weakness of right grip muscles only.  Sensory: intact to touch and pinprick and vibratory sensation.  Coordination: Rapid alternating movements normal in all extremities. Finger-to-nose and heel-to-shin performed accurately  bilaterally.  Gait and Station: Arises from chair without difficulty. Stance is normal. Gait demonstrates normal stride length and balance . Able to heel, toe and unsteady with tandem walk. No assistive device  Reflexes: 1+ and symmetric. Toes downgoing.  DIAGNOSTIC DATA (LABS, IMAGING, TESTING) - I reviewed patient records, labs, notes, testing and imaging myself where available.  Lab Results  Component Value Date   WBC 10.0 09/10/2015   HGB 13.9 09/10/2015   HCT 41.0 09/10/2015   MCV 94.2 09/10/2015   PLT 297.0 09/10/2015      Component Value Date/Time   NA 136 09/10/2015 1237   NA 144 08/29/2011 2307   K 3.7 09/10/2015 1237   K 3.6 08/29/2011 2307   CL 99 09/10/2015 1237   CL 103 08/29/2011 2307   CO2 26 09/10/2015 1237   CO2 26 08/29/2011 2307   GLUCOSE 160* 09/10/2015 1237   GLUCOSE 162* 08/29/2011 2307   BUN 16 09/10/2015 1237   BUN 14 08/29/2011 2307   CREATININE 0.95 09/10/2015 1237   CREATININE 0.98 01/27/2014 1448   CREATININE 1.03 08/29/2011 2307   CALCIUM 9.7 09/10/2015 1237   CALCIUM 8.9 08/29/2011 2307   PROT 7.6 09/10/2015 1237   ALBUMIN 4.1 09/10/2015 1237   AST 14 09/10/2015 1237   ALT 16 09/10/2015 1237   ALKPHOS 76 09/10/2015 1237   BILITOT 0.5 09/10/2015 1237   GFRNONAA 56* 06/23/2014 1904   GFRNONAA 56* 08/29/2011 2307   GFRAA 65* 06/23/2014 1904   GFRAA >60 08/29/2011 2307   Lab Results  Component Value Date   CHOL 201* 08/16/2015   HDL 55.00 08/16/2015   LDLCALC 120* 08/16/2015   LDLDIRECT 136.5 10/20/2011   TRIG 130.0 08/16/2015   CHOLHDL 4 08/16/2015   Lab Results  Component Value Date   HGBA1C 6.9* 08/16/2015   Lab Results  Component Value Date   VITAMINB12 234 08/16/2015   Lab Results  Component Value Date   TSH 4.18 08/16/2015      ASSESSMENT AND PLAN 18 year Caucasian lady with left middle cerebral artery  branch infarcts in August 2014 secondary to severe proximal left M1 stenosis who has had recurrent episodes of  right arm paresthesias with one episode in October 2014 with involuntary twisting and lack of control and shaking of the hand raising concern for simple partial seizure versus myoclonus. She was started on Keppra but did not tolerate it well due to hives and itching and was changed to gabapentin which also she had trouble tolerating due to sleepiness. She has done well of late without any episodes.Carotid Doppler study after last visit on 11/29/14 Consistent with 50-69% reduction in the right proximal ICA.Records reviewed.  Continue Ticlidopine for secondary stroke prevention Maintain strict control of hypertension with blood pressure goal below 130/90, today's reading 145/77 diabetes with hemoglobin A1c goal below 6.5% last 6.9 Lipids with LDL cholesterol goal below 100 last was 120, total cholesterol below 200 last was 201.  Eat a healthy diet with plenty of whole grains, cereals, fruits and vegetables,  Exercise regularly by walking and maintain ideal body weight.  Check follow-up carotid Doppler at this visit Need to see ophthalmologist for yearly follow-up F/U yearly  Dennie Bible, [facility], [facility], [facility] [address], [place], [place] [address] [phone]

## 2015-10-30 NOTE — Patient Instructions (Addendum)
Continue Ticlidopine for secondary stroke prevention Maintain strict control of hypertension with blood pressure goal below 130/90, today's reading 145/77 diabetes with hemoglobin A1c goal below 6.5% last 6.9 Lipids with LDL cholesterol goal below 100 last was 120, total cholesterol below 200 last was 201.  Eat a healthy diet with plenty of whole grains, cereals, fruits and vegetables,  Exercise regularly by walking and maintain ideal body weight.  Check follow-up carotid Doppler at this visit Need to see ophthalmologist for yearly follow-up F/U yearly

## 2015-10-31 NOTE — Progress Notes (Signed)
I agree with the above plan 

## 2015-11-07 ENCOUNTER — Ambulatory Visit (INDEPENDENT_AMBULATORY_CARE_PROVIDER_SITE_OTHER): Payer: PPO

## 2015-11-07 DIAGNOSIS — I639 Cerebral infarction, unspecified: Secondary | ICD-10-CM | POA: Diagnosis not present

## 2015-11-07 DIAGNOSIS — I10 Essential (primary) hypertension: Secondary | ICD-10-CM

## 2015-11-12 ENCOUNTER — Telehealth: Payer: Self-pay | Admitting: Nurse Practitioner

## 2015-11-12 NOTE — Telephone Encounter (Signed)
Spoke with patient and informed her results are not ready. Advised it will take up to one week, and she should get call back by end of this week. Advised she call back if she has not gotten call by Thurs afternoon or Friday morning. She requested her cell be called if she cannot be reached on home phone. Verified her cell number. She verbalized understanding of call back, appreciation.

## 2015-11-12 NOTE — Telephone Encounter (Signed)
Patient is calling and states she had a doppler on 5/3 and would like to have the results.  Please call.

## 2015-11-15 ENCOUNTER — Ambulatory Visit (INDEPENDENT_AMBULATORY_CARE_PROVIDER_SITE_OTHER): Payer: PPO

## 2015-11-15 ENCOUNTER — Other Ambulatory Visit (INDEPENDENT_AMBULATORY_CARE_PROVIDER_SITE_OTHER): Payer: PPO

## 2015-11-15 DIAGNOSIS — E538 Deficiency of other specified B group vitamins: Secondary | ICD-10-CM

## 2015-11-15 DIAGNOSIS — E1159 Type 2 diabetes mellitus with other circulatory complications: Secondary | ICD-10-CM

## 2015-11-15 LAB — HEMOGLOBIN A1C: Hgb A1c MFr Bld: 7 % — ABNORMAL HIGH (ref 4.6–6.5)

## 2015-11-15 LAB — VITAMIN B12: Vitamin B-12: 272 pg/mL (ref 211–911)

## 2015-11-15 MED ORDER — CYANOCOBALAMIN 1000 MCG/ML IJ SOLN
1000.0000 ug | Freq: Once | INTRAMUSCULAR | Status: AC
  Administered 2015-11-15: 1000 ug via INTRAMUSCULAR

## 2015-11-19 ENCOUNTER — Ambulatory Visit (INDEPENDENT_AMBULATORY_CARE_PROVIDER_SITE_OTHER): Payer: PPO | Admitting: *Deleted

## 2015-11-19 ENCOUNTER — Ambulatory Visit (INDEPENDENT_AMBULATORY_CARE_PROVIDER_SITE_OTHER): Payer: PPO | Admitting: Family Medicine

## 2015-11-19 ENCOUNTER — Encounter: Payer: Self-pay | Admitting: Family Medicine

## 2015-11-19 VITALS — BP 124/68 | HR 76 | Temp 98.1°F | Ht 69.0 in | Wt 200.8 lb

## 2015-11-19 DIAGNOSIS — E663 Overweight: Secondary | ICD-10-CM | POA: Diagnosis not present

## 2015-11-19 DIAGNOSIS — E538 Deficiency of other specified B group vitamins: Secondary | ICD-10-CM | POA: Diagnosis not present

## 2015-11-19 DIAGNOSIS — I639 Cerebral infarction, unspecified: Secondary | ICD-10-CM

## 2015-11-19 DIAGNOSIS — E1159 Type 2 diabetes mellitus with other circulatory complications: Secondary | ICD-10-CM

## 2015-11-19 DIAGNOSIS — I10 Essential (primary) hypertension: Secondary | ICD-10-CM

## 2015-11-19 MED ORDER — CYANOCOBALAMIN 1000 MCG/ML IJ SOLN
1000.0000 ug | Freq: Once | INTRAMUSCULAR | Status: AC
  Administered 2015-11-19: 1000 ug via INTRAMUSCULAR

## 2015-11-19 MED ORDER — LOSARTAN POTASSIUM-HCTZ 100-25 MG PO TABS
1.0000 | ORAL_TABLET | Freq: Every day | ORAL | Status: DC
Start: 2015-11-19 — End: 2016-09-02

## 2015-11-19 NOTE — Telephone Encounter (Signed)
I spoke to pt and relayed that her carotid doppler study stable.  She verbalized understanding.

## 2015-11-19 NOTE — Progress Notes (Signed)
Pre visit review using our clinic review tool, if applicable. No additional management support is needed unless otherwise documented below in the visit note. 

## 2015-11-19 NOTE — Assessment & Plan Note (Signed)
bp in fair control at this time  BP Readings from Last 1 Encounters:  11/19/15 124/68   No changes needed Disc lifstyle change with low sodium diet and exercise  Labs reviewed

## 2015-11-19 NOTE — Patient Instructions (Signed)
Get vitamin B12 supplement over the counter 1000 mcg and take one day  Also let's do 4 B12 shots in 4 weeks -then go to monthly and then recheck B12 level at next visit  For diabetes- really try to decrease sweets and carbs in diet (follow a diabetic diet) , any amount of exercise is helpful  Weight loss really helps also  Take care of yourself  Follow up in 3 months with labs prior

## 2015-11-19 NOTE — Assessment & Plan Note (Signed)
Still in low normal range  Lab Results  Component Value Date   VITAMINB12 272 11/15/2015   Will give 4 B12 shots in 4 weeks as well as 1000 mcg daily po otc Re check at f/u

## 2015-11-19 NOTE — Progress Notes (Signed)
Carelink Summary Report / Loop Recorder 

## 2015-11-19 NOTE — Assessment & Plan Note (Signed)
Discussed how this problem influences overall health and the risks it imposes  Reviewed plan for weight loss with lower calorie diet (via better food choices and also portion control or program like weight watchers) and exercise building up to or more than 30 minutes 5 days per week including some aerobic activity    

## 2015-11-19 NOTE — Progress Notes (Signed)
Subjective:    Patient ID: Margaret Vazquez, female    DOB: 08/09/1935, 80 y.o.   MRN: [id number]  HPI Here for f/u of chronic medical problems   Doing well overall  Just not a lot of energy  Wakes up fine and then tired 2-3 hours later    Had a bad uri at last visit - got over the resp symptoms     bp is stable today  No cp or palpitations or headaches or edema  No side effects to medicines  BP Readings from Last 3 Encounters:  11/19/15 124/68  10/30/15 145/77  09/10/15 130/74     This is in good control   Wt is down 2 lb with bmi of 29  Now in overweight range  Vit B12 def- level is up from 234 to 272 -still in low normal range  Gets B12 shots every 6-8 weeks Not on any oral B12    Diabetes Home sugar results - her glucose was very high when she was sick with uri  Still not back to normal - has had a few drops  Glucose 120s- 150/60s  DM diet -she follows diabetic diet more than 50% of the time, she cheats occ with sweets Exercise - no regular exercise / too tired and her legs hurt all the time and bother her at night  Symptoms-none  A1C last  Lab Results  Component Value Date   HGBA1C 7.0* 11/15/2015  this is up from 6.9 Wants to work on diet  No problems with medications - she is on glipizide xl  Is intolerant of metformin  Renal protection ARB Last eye exam 6/16    Wants to work on elim sweets  Protein and vegetable   Patient Active Problem List   Diagnosis Date Noted  . Fall 08/13/2015  . Chest wall pain 08/13/2015  . Left ankle pain 08/13/2015  . Left knee pain 06/14/2015  . Chest discomfort 11/22/2014  . Overweight (BMI 25.0-29.9) 08/23/2014  . Type 2 diabetes mellitus with vascular disease ([place]) 01/12/2014  . TIA (transient ischemic attack) 01/11/2014  . CVA (cerebral infarction) 01/11/2014  . Sensory disturbance 01/11/2014  . Pain of right thumb 11/14/2013  . Left hip pain 11/14/2013  . Chest pain at rest 08/31/2013  . LOOP Recorder LINQ  06/07/2013  . Fatigue 06/07/2013  . Sinusitis, chronic 05/16/2013  . Acute ischemic stroke ([place]) 02/15/2013  . Numbness and tingling of right arm 02/14/2013  . Encounter for Medicare annual wellness exam 10/27/2012  . Encopresis(307.7) 12/15/2011  . Post-menopausal 10/28/2011  . PERSONAL HX COLONIC POLYPS 09/04/2009  . B12 deficiency 03/06/2009  . UNSPECIFIED ANEMIA 02/13/2009  . ALLERGIC RHINITIS 02/14/2008  . BACK PAIN, LUMBAR 11/03/2007  . Hyperlipidemia 11/24/2006  . Essential hypertension 11/24/2006  . Diabetes type 2, controlled ([place]) 10/09/2006  . GERD 10/09/2006  . OVERACTIVE BLADDER 10/09/2006  . INCONTINENCE, URGE 10/09/2006  . SKIN CANCER, HX OF 10/09/2006   Past Medical History  Diagnosis Date  . Hypertension   . Diabetes mellitus     type II  . Hyperlipidemia     myalgias with Lipitor and Zetia  . GERD (gastroesophageal reflux disease)   . Skin cancer     hx of basal cell/ak's  . Colon polyps 2009  . Allergy history, drug     Aspirin  . Cervical stenosis of spine     With neck pain  . CAD (coronary artery disease) 04/08/11    non obst  by cath  . Migraine   . TIA (transient ischemic attack)   . Stroke [facility]) August 2014   Past Surgical History  Procedure Laterality Date  . Appendectomy    . Tubal ligation      BTL  . Breast surgery  1992    breast biopsy  . Eye surgery  2005    tear duct surgery  . Nasal sinus surgery  01/2005  . Cardiac catheterization  1993    LHC: normal coronaries.   . Stress cardiolite  11/1999    Normal/ negative  . Cystoscopy w/ decannulation  03/2000    Normal  . Abd [place]  07/2003    Negative  . Tear duct surgery  2005  . Colonoscopy  12/2007    Adenomatous colon polyps  . Cardiac catheterization  04/07/2011    non obst CAD (Dr Burt Knack)  . Tee without cardioversion N/A 02/16/2013    Procedure: TRANSESOPHAGEAL ECHOCARDIOGRAM (TEE);  Surgeon: Josue Hector, MD;  Location: [facility] ENDOSCOPY;  Service: Cardiovascular;   Laterality: N/A;  . Loop recorder implant    . Loop recorder implant N/A 02/16/2013    Procedure: LOOP RECORDER IMPLANT;  Surgeon: Deboraha Sprang, MD;  Location: [facility] CATH LAB;  Service: Cardiovascular;  Laterality: N/A;   Social History  Substance Use Topics  . Smoking status: Never Smoker   . Smokeless tobacco: Never Used  . Alcohol Use: No   Family History  Problem Relation Age of Onset  . Lung cancer Brother   . Colon cancer Neg Hx   . Skin cancer Daughter   . Diabetes Sister   . Diabetes Brother   . Pancreatic cancer Brother   . Heart disease Mother   . Heart disease Father   . Brain cancer Other    Allergies  Allergen Reactions  . Bee Venom Hives, Shortness Of Breath and Swelling  . Nabumetone Anaphylaxis  . Amoxicillin-Pot Clavulanate Hives and Swelling    To lips.  . Aspirin Hives  . Atorvastatin Swelling     joint pain/swelling, inc liver tests  . Clopidogrel Bisulfate Hives  . Codeine Nausea And Vomiting  . Ezetimibe Other (See Comments)     fatigue  . Metformin And Related Other (See Comments)    Diarrhea   . Valsartan Other (See Comments)     fatigue   Current Outpatient Prescriptions on File Prior to Visit  Medication Sig Dispense Refill  . diphenhydrAMINE (BENADRYL) 25 MG tablet Take 50 mg by mouth every 8 (eight) hours as needed for allergies.     Marland Kitchen EPINEPHrine (EPI-PEN) 0.3 mg/0.3 mL DEVI Inject 0.3 mg into the muscle daily as needed (allergic reaction).     . fluticasone (FLONASE) 50 MCG/ACT nasal spray Place 2 sprays into both nostrils 2 (two) times daily as needed. Reported on 09/10/2015    . glipiZIDE (GLUCOTROL XL) 10 MG 24 hr tablet Take 1 tablet (10 mg total) by mouth daily with breakfast. 30 tablet 11  . glucose blood (ONE TOUCH ULTRA TEST) test strip CHECK BLOOD SUGAR ONCE DAILY AND AS NEEDED (DX. E11.9) 100 each 0  . losartan-hydrochlorothiazide (HYZAAR) 100-25 MG tablet TAKE ONE TABLET BY MOUTH EVERY DAY 30 tablet 1  . ticlopidine (TICLID) 250 MG  tablet Take 1 tablet (250 mg total) by mouth 2 (two) times daily with a meal. 180 tablet 0  . [DISCONTINUED] levETIRAcetam (KEPPRA) 250 MG tablet Take 1 tablet (250 mg total) by mouth 2 (two) times daily. 60 tablet  0   No current facility-administered medications on file prior to visit.     Review of Systems Review of Systems  Constitutional: Negative for fever, appetite change, fatigue and unexpected weight change.  Eyes: Negative for pain and visual disturbance.  Respiratory: Negative for cough and shortness of breath.   Cardiovascular: Negative for cp or palpitations    Gastrointestinal: Negative for nausea, diarrhea and constipation.  Genitourinary: Negative for urgency and frequency.  Skin: Negative for pallor or rash   Neurological: Negative for weakness, light-headedness, numbness and headaches.  Hematological: Negative for adenopathy. Does not bruise/bleed easily.  Psychiatric/Behavioral: Negative for dysphoric mood. The patient is not nervous/anxious.         Objective:   Physical Exam  Constitutional: She appears well-developed and well-nourished. No distress.  overwt and well app  HENT:  Head: Normocephalic and atraumatic.  Mouth/Throat: Oropharynx is clear and moist.  Eyes: Conjunctivae and EOM are normal. Pupils are equal, round, and reactive to light.  Neck: Normal range of motion. Neck supple. No JVD present. Carotid bruit is not present. No thyromegaly present.  Cardiovascular: Normal rate, regular rhythm, normal heart sounds and intact distal pulses.  Exam reveals no gallop.   Pulmonary/Chest: Effort normal and breath sounds normal. No respiratory distress. She has no wheezes. She has no rales.  No crackles  Abdominal: Soft. Bowel sounds are normal. She exhibits no distension, no abdominal bruit and no mass. There is no tenderness.  Musculoskeletal: She exhibits no edema.  Lymphadenopathy:    She has no cervical adenopathy.  Neurological: She is alert. She has  normal reflexes.  Skin: Skin is warm and dry. No rash noted. No pallor.  Psychiatric: She has a normal mood and affect.          Assessment & Plan:   Problem List Items Addressed This Visit      Cardiovascular and Mediastinum   Type 2 diabetes mellitus with vascular disease ([place])    Lab Results  Component Value Date   HGBA1C 7.0* 11/15/2015   Disc imp of better low glycemic diet and wt control  Outlined a plan  Lab and f/u 3 mo  Add agent if no imp      Relevant Medications   losartan-hydrochlorothiazide (HYZAAR) 100-25 MG tablet   Essential hypertension - Primary    bp in fair control at this time  BP Readings from Last 1 Encounters:  11/19/15 124/68   No changes needed Disc lifstyle change with low sodium diet and exercise  Labs reviewed      Relevant Medications   losartan-hydrochlorothiazide (HYZAAR) 100-25 MG tablet     Digestive   B12 deficiency    Still in low normal range  Lab Results  Component Value Date   VITAMINB12 272 11/15/2015   Will give 4 B12 shots in 4 weeks as well as 1000 mcg daily po otc Re check at f/u      Relevant Medications   cyanocobalamin ((VITAMIN B-12)) injection 1,000 mcg (Completed)     Other   Overweight (BMI 25.0-29.9)    Discussed how this problem influences overall health and the risks it imposes  Reviewed plan for weight loss with lower calorie diet (via better food choices and also portion control or program like weight watchers) and exercise building up to or more than 30 minutes 5 days per week including some aerobic activity

## 2015-11-19 NOTE — Assessment & Plan Note (Signed)
Lab Results  Component Value Date   HGBA1C 7.0* 11/15/2015   Disc imp of better low glycemic diet and wt control  Outlined a plan  Lab and f/u 3 mo  Add agent if no imp

## 2015-11-26 ENCOUNTER — Telehealth: Payer: Self-pay | Admitting: Family Medicine

## 2015-11-26 NOTE — Telephone Encounter (Signed)
Pt left message wanting refill on losartan-hydrochlorothiazide (HYZAAR) 100-25 MG tablet.  Returned patient's call, refill was sent to pharmacy on 11/19/15 to requested pharmacy.

## 2015-11-27 ENCOUNTER — Ambulatory Visit (INDEPENDENT_AMBULATORY_CARE_PROVIDER_SITE_OTHER): Payer: PPO

## 2015-11-27 ENCOUNTER — Other Ambulatory Visit: Payer: Self-pay

## 2015-11-27 DIAGNOSIS — E538 Deficiency of other specified B group vitamins: Secondary | ICD-10-CM

## 2015-11-27 MED ORDER — CYANOCOBALAMIN 1000 MCG/ML IJ SOLN
1000.0000 ug | Freq: Once | INTRAMUSCULAR | Status: AC
  Administered 2015-11-27: 1000 ug via INTRAMUSCULAR

## 2015-11-30 LAB — CUP PACEART REMOTE DEVICE CHECK: Date Time Interrogation Session: 20170316100655

## 2015-12-03 LAB — CUP PACEART REMOTE DEVICE CHECK: Date Time Interrogation Session: 20170415103628

## 2015-12-03 NOTE — Progress Notes (Signed)
Carelink summary report received. Battery status OK. Normal device function. No new symptom episodes, tachy episodes, brady, or pause episodes. No new AF episodes. Monthly summary reports and ROV/PRN 

## 2015-12-04 ENCOUNTER — Ambulatory Visit (INDEPENDENT_AMBULATORY_CARE_PROVIDER_SITE_OTHER): Payer: PPO | Admitting: *Deleted

## 2015-12-04 DIAGNOSIS — E538 Deficiency of other specified B group vitamins: Secondary | ICD-10-CM

## 2015-12-04 MED ORDER — CYANOCOBALAMIN 1000 MCG/ML IJ SOLN
1000.0000 ug | Freq: Once | INTRAMUSCULAR | Status: AC
  Administered 2015-12-04: 1000 ug via INTRAMUSCULAR

## 2015-12-11 ENCOUNTER — Ambulatory Visit (INDEPENDENT_AMBULATORY_CARE_PROVIDER_SITE_OTHER): Payer: PPO | Admitting: *Deleted

## 2015-12-11 DIAGNOSIS — E538 Deficiency of other specified B group vitamins: Secondary | ICD-10-CM | POA: Diagnosis not present

## 2015-12-11 MED ORDER — CYANOCOBALAMIN 1000 MCG/ML IJ SOLN
1000.0000 ug | Freq: Once | INTRAMUSCULAR | Status: AC
  Administered 2015-12-11: 1000 ug via INTRAMUSCULAR

## 2015-12-18 ENCOUNTER — Ambulatory Visit: Payer: PPO

## 2015-12-19 ENCOUNTER — Ambulatory Visit (INDEPENDENT_AMBULATORY_CARE_PROVIDER_SITE_OTHER): Payer: PPO | Admitting: *Deleted

## 2015-12-19 DIAGNOSIS — I639 Cerebral infarction, unspecified: Secondary | ICD-10-CM | POA: Diagnosis not present

## 2015-12-19 NOTE — Progress Notes (Signed)
Carelink Summary Report / Loop Recorder 

## 2015-12-21 ENCOUNTER — Ambulatory Visit: Payer: PPO

## 2015-12-25 LAB — CUP PACEART REMOTE DEVICE CHECK: Date Time Interrogation Session: 20170515103618

## 2015-12-26 ENCOUNTER — Ambulatory Visit (INDEPENDENT_AMBULATORY_CARE_PROVIDER_SITE_OTHER): Payer: PPO | Admitting: *Deleted

## 2015-12-26 DIAGNOSIS — E538 Deficiency of other specified B group vitamins: Secondary | ICD-10-CM | POA: Diagnosis not present

## 2015-12-26 MED ORDER — CYANOCOBALAMIN 1000 MCG/ML IJ SOLN
1000.0000 ug | Freq: Once | INTRAMUSCULAR | Status: AC
  Administered 2015-12-26: 1000 ug via INTRAMUSCULAR

## 2016-01-01 DIAGNOSIS — L570 Actinic keratosis: Secondary | ICD-10-CM | POA: Diagnosis not present

## 2016-01-01 DIAGNOSIS — D0461 Carcinoma in situ of skin of right upper limb, including shoulder: Secondary | ICD-10-CM | POA: Diagnosis not present

## 2016-01-01 DIAGNOSIS — C44622 Squamous cell carcinoma of skin of right upper limb, including shoulder: Secondary | ICD-10-CM | POA: Diagnosis not present

## 2016-01-01 DIAGNOSIS — D0462 Carcinoma in situ of skin of left upper limb, including shoulder: Secondary | ICD-10-CM | POA: Diagnosis not present

## 2016-01-01 DIAGNOSIS — D485 Neoplasm of uncertain behavior of skin: Secondary | ICD-10-CM | POA: Diagnosis not present

## 2016-01-01 DIAGNOSIS — D2262 Melanocytic nevi of left upper limb, including shoulder: Secondary | ICD-10-CM | POA: Diagnosis not present

## 2016-01-01 DIAGNOSIS — Z85828 Personal history of other malignant neoplasm of skin: Secondary | ICD-10-CM | POA: Diagnosis not present

## 2016-01-01 DIAGNOSIS — L821 Other seborrheic keratosis: Secondary | ICD-10-CM | POA: Diagnosis not present

## 2016-01-01 DIAGNOSIS — X32XXXA Exposure to sunlight, initial encounter: Secondary | ICD-10-CM | POA: Diagnosis not present

## 2016-01-01 DIAGNOSIS — D2271 Melanocytic nevi of right lower limb, including hip: Secondary | ICD-10-CM | POA: Diagnosis not present

## 2016-01-01 DIAGNOSIS — D225 Melanocytic nevi of trunk: Secondary | ICD-10-CM | POA: Diagnosis not present

## 2016-01-16 LAB — CUP PACEART REMOTE DEVICE CHECK: Date Time Interrogation Session: 20170614103553

## 2016-01-17 ENCOUNTER — Ambulatory Visit (INDEPENDENT_AMBULATORY_CARE_PROVIDER_SITE_OTHER): Payer: PPO | Admitting: *Deleted

## 2016-01-17 DIAGNOSIS — E538 Deficiency of other specified B group vitamins: Secondary | ICD-10-CM | POA: Diagnosis not present

## 2016-01-17 MED ORDER — CYANOCOBALAMIN 1000 MCG/ML IJ SOLN
1000.0000 ug | Freq: Once | INTRAMUSCULAR | Status: AC
  Administered 2016-01-17: 1000 ug via INTRAMUSCULAR

## 2016-01-17 NOTE — Addendum Note (Signed)
Addended by: Tammi Sou on: 01/17/2016 10:22 AM   Modules accepted: Level of Service

## 2016-01-18 ENCOUNTER — Ambulatory Visit (INDEPENDENT_AMBULATORY_CARE_PROVIDER_SITE_OTHER): Payer: PPO | Admitting: *Deleted

## 2016-01-18 DIAGNOSIS — I639 Cerebral infarction, unspecified: Secondary | ICD-10-CM

## 2016-01-18 NOTE — Progress Notes (Signed)
Carelink Summary Report / Loop Recorder 

## 2016-01-25 LAB — CUP PACEART REMOTE DEVICE CHECK: Date Time Interrogation Session: 20170714110627

## 2016-01-28 DIAGNOSIS — H2513 Age-related nuclear cataract, bilateral: Secondary | ICD-10-CM | POA: Diagnosis not present

## 2016-01-30 ENCOUNTER — Other Ambulatory Visit: Payer: Self-pay | Admitting: Family Medicine

## 2016-02-05 LAB — HM DIABETES EYE EXAM

## 2016-02-12 ENCOUNTER — Other Ambulatory Visit (INDEPENDENT_AMBULATORY_CARE_PROVIDER_SITE_OTHER): Payer: PPO

## 2016-02-12 DIAGNOSIS — E538 Deficiency of other specified B group vitamins: Secondary | ICD-10-CM | POA: Diagnosis not present

## 2016-02-12 DIAGNOSIS — E1159 Type 2 diabetes mellitus with other circulatory complications: Secondary | ICD-10-CM

## 2016-02-12 DIAGNOSIS — I10 Essential (primary) hypertension: Secondary | ICD-10-CM | POA: Diagnosis not present

## 2016-02-12 LAB — COMPREHENSIVE METABOLIC PANEL
ALT: 13 U/L (ref 0–35)
AST: 13 U/L (ref 0–37)
Albumin: 3.8 g/dL (ref 3.5–5.2)
Alkaline Phosphatase: 85 U/L (ref 39–117)
BUN: 14 mg/dL (ref 6–23)
CO2: 28 mEq/L (ref 19–32)
Calcium: 9.1 mg/dL (ref 8.4–10.5)
Chloride: 104 mEq/L (ref 96–112)
Creatinine, Ser: 0.94 mg/dL (ref 0.40–1.20)
GFR: 61.15 mL/min (ref >60.00)
Glucose, Bld: 98 mg/dL (ref 70–99)
Potassium: 3.9 mEq/L (ref 3.5–5.1)
Sodium: 140 mEq/L (ref 135–145)
Total Bilirubin: 0.4 mg/dL (ref 0.2–1.2)
Total Protein: 7 g/dL (ref 6.0–8.3)

## 2016-02-12 LAB — LIPID PANEL
Cholesterol: 179 mg/dL (ref 0–200)
HDL: 46 mg/dL (ref >39.00)
LDL Cholesterol: 104 mg/dL — ABNORMAL HIGH (ref 0–99)
NonHDL: 133.44
Total CHOL/HDL Ratio: 4
Triglycerides: 147 mg/dL (ref 0.0–149.0)
VLDL: 29.4 mg/dL (ref 0.0–40.0)

## 2016-02-12 LAB — HEMOGLOBIN A1C: Hgb A1c MFr Bld: 6.5 % (ref 4.6–6.5)

## 2016-02-12 LAB — VITAMIN B12: Vitamin B-12: 947 pg/mL — ABNORMAL HIGH (ref 211–911)

## 2016-02-18 ENCOUNTER — Ambulatory Visit (INDEPENDENT_AMBULATORY_CARE_PROVIDER_SITE_OTHER): Payer: PPO | Admitting: *Deleted

## 2016-02-18 DIAGNOSIS — I639 Cerebral infarction, unspecified: Secondary | ICD-10-CM | POA: Diagnosis not present

## 2016-02-18 NOTE — Progress Notes (Signed)
Carelink Summary Report / Loop Recorder 

## 2016-02-19 ENCOUNTER — Ambulatory Visit (INDEPENDENT_AMBULATORY_CARE_PROVIDER_SITE_OTHER): Payer: PPO | Admitting: Family Medicine

## 2016-02-19 ENCOUNTER — Encounter: Payer: Self-pay | Admitting: Family Medicine

## 2016-02-19 VITALS — BP 120/60 | HR 64 | Temp 98.1°F | Ht 68.5 in | Wt 198.5 lb

## 2016-02-19 DIAGNOSIS — I10 Essential (primary) hypertension: Secondary | ICD-10-CM

## 2016-02-19 DIAGNOSIS — E1159 Type 2 diabetes mellitus with other circulatory complications: Secondary | ICD-10-CM

## 2016-02-19 DIAGNOSIS — E785 Hyperlipidemia, unspecified: Secondary | ICD-10-CM | POA: Diagnosis not present

## 2016-02-19 DIAGNOSIS — E538 Deficiency of other specified B group vitamins: Secondary | ICD-10-CM

## 2016-02-19 NOTE — Assessment & Plan Note (Signed)
Disc goals for lipids and reasons to control them Rev labs with pt Rev low sat fat diet in detail LDL is close to goal at 104 Intol of statins

## 2016-02-19 NOTE — Assessment & Plan Note (Signed)
Improved A1C with less carbs in diet  opth exam upcoming Enc wt loss  F/u planned

## 2016-02-19 NOTE — Progress Notes (Signed)
Pre visit review using our clinic review tool, if applicable. No additional management support is needed unless otherwise documented below in the visit note. 

## 2016-02-19 NOTE — Assessment & Plan Note (Signed)
Much better level after 4 shots and now daily oral replacement  Lab Results  Component Value Date   VITAMINB12 947 (H) 02/12/2016   Also clinical imp

## 2016-02-19 NOTE — Assessment & Plan Note (Signed)
bp in fair control at this time  BP Readings from Last 1 Encounters:  02/19/16 120/60   No changes needed Disc lifstyle change with low sodium diet and exercise  Labs reviewed  Enc wt loss

## 2016-02-19 NOTE — Progress Notes (Signed)
Subjective:    Patient ID: Margaret Vazquez, female    DOB: 1935-09-04, 80 y.o.   MRN: [id number]  HPI Here for f/u of chronic health problems   Wt Readings from Last 3 Encounters:  02/19/16 198 lb 8 oz (90 kg)  11/19/15 200 lb 12.8 oz (91.1 kg)  10/30/15 202 lb 12.8 oz (92 kg)  obese - down 4 lb since April  [place]  bmi of 29.7  bp is stable today  No cp or palpitations or headaches or edema  No side effects to medicines  BP Readings from Last 3 Encounters:  02/19/16 120/60  11/19/15 124/68  10/30/15 (!) 145/77       Chemistry      Component Value Date/Time   NA 140 02/12/2016 0838   NA 144 08/29/2011 2307   K 3.9 02/12/2016 0838   K 3.6 08/29/2011 2307   CL 104 02/12/2016 0838   CL 103 08/29/2011 2307   CO2 28 02/12/2016 0838   CO2 26 08/29/2011 2307   BUN 14 02/12/2016 0838   BUN 14 08/29/2011 2307   CREATININE 0.94 02/12/2016 0838   CREATININE 0.98 01/27/2014 1448      Component Value Date/Time   CALCIUM 9.1 02/12/2016 0838   CALCIUM 8.9 08/29/2011 2307   ALKPHOS 85 02/12/2016 0838   AST 13 02/12/2016 0838   ALT 13 02/12/2016 0838   BILITOT 0.4 02/12/2016 0838       Hyperlipidemia Lab Results  Component Value Date   CHOL 179 02/12/2016   CHOL 201 (H) 08/16/2015   CHOL 203 (H) 11/15/2014   Lab Results  Component Value Date   HDL 46.00 02/12/2016   HDL 55.00 08/16/2015   HDL 46.80 11/15/2014   Lab Results  Component Value Date   LDLCALC 104 (H) 02/12/2016   LDLCALC 120 (H) 08/16/2015   LDLCALC 125 (H) 11/15/2014   Lab Results  Component Value Date   TRIG 147.0 02/12/2016   TRIG 130.0 08/16/2015   TRIG 157.0 (H) 11/15/2014   Lab Results  Component Value Date   CHOLHDL 4 02/12/2016   CHOLHDL 4 08/16/2015   CHOLHDL 4 11/15/2014   Lab Results  Component Value Date   LDLDIRECT 136.5 10/20/2011   LDLDIRECT 134.7 08/08/2008   LDLDIRECT 143.0 04/27/2007    Goal is LDL of 100- it is 104  Overall  improved from last time  No more red meat  She does eat some eggs occ Eats fried foods - 1-2 times per week    B12 def Given 4 B12 shots in 4 weeks and now taking 1000 mcg per day  Level was 272 Now it is 947-much improved - and feels a whole lot better !   DM2 Lab Results  Component Value Date   HGBA1C 6.5 02/12/2016   This is down from 7! Much better  Is staying away from sweets and carbs  No high or low glucose readings  Had eye exam-no retinopathy    Patient Active Problem List   Diagnosis Date Noted  . Fall 08/13/2015  . Chest wall pain 08/13/2015  . Left ankle pain 08/13/2015  . Left knee pain 06/14/2015  . Chest discomfort 11/22/2014  . Overweight (BMI 25.0-29.9) 08/23/2014  . Type 2 diabetes mellitus with vascular disease ([place]) 01/12/2014  . TIA (transient ischemic attack) 01/11/2014  . CVA (cerebral infarction) 01/11/2014  . Sensory disturbance 01/11/2014  . Pain of right thumb 11/14/2013  .  Left hip pain 11/14/2013  . Chest pain at rest 08/31/2013  . LOOP Recorder LINQ 06/07/2013  . Fatigue 06/07/2013  . Sinusitis, chronic 05/16/2013  . Acute ischemic stroke ([place]) 02/15/2013  . Numbness and tingling of right arm 02/14/2013  . Encounter for Medicare annual wellness exam 10/27/2012  . Encopresis(307.7) 12/15/2011  . Post-menopausal 10/28/2011  . PERSONAL HX COLONIC POLYPS 09/04/2009  . B12 deficiency 03/06/2009  . UNSPECIFIED ANEMIA 02/13/2009  . ALLERGIC RHINITIS 02/14/2008  . BACK PAIN, LUMBAR 11/03/2007  . Hyperlipidemia 11/24/2006  . Essential hypertension 11/24/2006  . Diabetes type 2, controlled ([place]) 10/09/2006  . GERD 10/09/2006  . OVERACTIVE BLADDER 10/09/2006  . INCONTINENCE, URGE 10/09/2006  . SKIN CANCER, HX OF 10/09/2006   Past Medical History:  Diagnosis Date  . Allergy history, drug    Aspirin  . CAD (coronary artery disease) 04/08/11   non obst by cath  . Cervical stenosis of spine    With neck pain  . Colon polyps 2009  .  Diabetes mellitus    type II  . GERD (gastroesophageal reflux disease)   . Hyperlipidemia    myalgias with Lipitor and Zetia  . Hypertension   . Migraine   . Skin cancer    hx of basal cell/ak's  . Stroke [facility]) August 2014  . TIA (transient ischemic attack)    Past Surgical History:  Procedure Laterality Date  . ABD [place]  07/2003   Negative  . APPENDECTOMY    . BREAST SURGERY  1992   breast biopsy  . CARDIAC CATHETERIZATION  1993   LHC: normal coronaries.   Marland Kitchen CARDIAC CATHETERIZATION  04/07/2011   non obst CAD (Dr Burt Knack)  . COLONOSCOPY  12/2007   Adenomatous colon polyps  . CYSTOSCOPY W/ DECANNULATION  03/2000   Normal  . EYE SURGERY  2005   tear duct surgery  . LOOP RECORDER IMPLANT    . LOOP RECORDER IMPLANT N/A 02/16/2013   Procedure: LOOP RECORDER IMPLANT;  Surgeon: Deboraha Sprang, MD;  Location[facility] C[facility] LAB;  Service: Cardiovascular;  Laterality: N/A;  . NASAL SINUS SURGERY  01/2005  . STRESS CARDIOLITE  11/1999   Normal/ negative  . [place] DUCT SURGERY  2005  . TEE WITHOUT CARDIOVERSION N/A 02/16/2013   Procedure: TRANSESOPHAGEAL ECHOCARDIOGRAM (TEE);  Surgeon: Josue Hector, MD;  Location: [facility] ENDOSCOPY;  Service: Cardiovascular;  Laterality: N/A;  . TUBAL LIGATION     BTL   Social History  Substance Use Topics  . Smoking status: Never Smoker  . Smokeless tobacco: Never Used  . Alcohol use No   Family History  Problem Relation Age of Onset  . Lung cancer Brother   . Colon cancer Neg Hx   . Skin cancer Daughter   . Diabetes Sister   . Diabetes Brother   . Pancreatic cancer Brother   . Heart disease Mother   . Heart disease Father   . Brain cancer Other    Allergies  Allergen Reactions  . Bee Venom Hives, Shortness Of Breath and Swelling  . Nabumetone Anaphylaxis  . Amoxicillin-Pot Clavulanate Hives and Swelling    To lips.  . Aspirin Hives  . Atorvastatin Swelling     joint pain/swelling, inc liver tests  . Clopidogrel Bisulfate Hives  . Codeine  Nausea And Vomiting  . Ezetimibe Other (See Comments)     fatigue  . Metformin And Related Other (See Comments)    Diarrhea   . Valsartan Other (See Comments)  fatigue   Current Outpatient Prescriptions on File Prior to Visit  Medication Sig Dispense Refill  . diphenhydrAMINE (BENADRYL) 25 MG tablet Take 50 mg by mouth every 8 (eight) hours as needed for allergies.     Marland Kitchen EPINEPHrine (EPI-PEN) 0.3 mg/0.3 mL DEVI Inject 0.3 mg into the muscle daily as needed (allergic reaction).     . fluticasone (FLONASE) 50 MCG/ACT nasal spray Place 2 sprays into both nostrils 2 (two) times daily as needed. Reported on 09/10/2015    . glipiZIDE (GLUCOTROL XL) 10 MG 24 hr tablet Take 1 tablet (10 mg total) by mouth daily with breakfast. 30 tablet 11  . glucose blood (ONE TOUCH ULTRA TEST) test strip CHECK BLOOD SUGAR ONCE DAILY AND AS NEEDED (DX. E11.9) 100 each 0  . losartan-hydrochlorothiazide (HYZAAR) 100-25 MG tablet Take 1 tablet by mouth daily. 30 tablet 11  . ticlopidine (TICLID) 250 MG tablet Take 1 tablet (250 mg total) by mouth 2 (two) times daily with a meal. 180 tablet 0  . [DISCONTINUED] levETIRAcetam (KEPPRA) 250 MG tablet Take 1 tablet (250 mg total) by mouth 2 (two) times daily. 60 tablet 0   No current facility-administered medications on file prior to visit.     Review of Systems    Review of Systems  Constitutional: Negative for fever, appetite change, fatigue and unexpected weight change.  Eyes: Negative for pain and visual disturbance.  Respiratory: Negative for cough and shortness of breath.   Cardiovascular: Negative for cp or palpitations    Gastrointestinal: Negative for nausea, diarrhea and constipation.  Genitourinary: Negative for urgency and frequency.  Skin: Negative for pallor or rash   Neurological: Negative for weakness, light-headedness, numbness and headaches.  Hematological: Negative for adenopathy. Does not bruise/bleed easily.  Psychiatric/Behavioral: Negative  for dysphoric mood. The patient is not nervous/anxious.      Objective:   Physical Exam  Constitutional: She appears well-developed and well-nourished. No distress.  obese and well appearing   HENT:  Head: Normocephalic and atraumatic.  Mouth/Throat: Oropharynx is clear and moist.  Eyes: Conjunctivae and EOM are normal. Pupils are equal, round, and reactive to light.  Neck: Normal range of motion. Neck supple. No JVD present. Carotid bruit is not present. No thyromegaly present.  Cardiovascular: Normal rate, regular rhythm, normal heart sounds and intact distal pulses.  Exam reveals no gallop.   Pulmonary/Chest: Effort normal and breath sounds normal. No respiratory distress. She has no wheezes. She has no rales.  No crackles  Abdominal: Soft. Bowel sounds are normal. She exhibits no distension, no abdominal bruit and no mass. There is no tenderness.  Musculoskeletal: She exhibits no edema.  Lymphadenopathy:    She has no cervical adenopathy.  Neurological: She is alert. She has normal reflexes.  Skin: Skin is warm and dry. No rash noted.  Psychiatric: She has a normal mood and affect.          Assessment & Plan:   Problem List Items Addressed This Visit      Cardiovascular and Mediastinum   Type 2 diabetes mellitus with vascular disease ([place])    Improved A1C with less carbs in diet  opth exam upcoming Enc wt loss  F/u planned        Essential hypertension - Primary    bp in fair control at this time  BP Readings from Last 1 Encounters:  02/19/16 120/60   No changes needed Disc lifstyle change with low sodium diet and exercise  Labs reviewed  Enc  wt loss         Digestive   B12 deficiency    Much better level after 4 shots and now daily oral replacement  Lab Results  Component Value Date   [id number] (H) 02/12/2016   Also clinical imp         Other   Hyperlipidemia    Disc goals for lipids and reasons to control them Rev labs with pt Rev low sat  fat diet in detail LDL is close to goal at 104 Intol of statins        Other Visit Diagnoses   None.

## 2016-02-19 NOTE — Patient Instructions (Addendum)
Stay away from fried foods for your cholesterol (once per month max) Stay away from red meat  Almost at goal for cholesterol  Get a flu shot in the fall  Glad you are doing better Keep working on healthy habits

## 2016-02-29 DIAGNOSIS — C44629 Squamous cell carcinoma of skin of left upper limb, including shoulder: Secondary | ICD-10-CM | POA: Diagnosis not present

## 2016-02-29 DIAGNOSIS — D0462 Carcinoma in situ of skin of left upper limb, including shoulder: Secondary | ICD-10-CM | POA: Diagnosis not present

## 2016-03-14 ENCOUNTER — Telehealth: Payer: Self-pay | Admitting: Cardiology

## 2016-03-14 NOTE — Telephone Encounter (Signed)
LMOVM requesting that pt send manual transmission b/c home monitor has not updated in at least 14 days.    

## 2016-03-15 LAB — CUP PACEART REMOTE DEVICE CHECK: Date Time Interrogation Session: 20170813113553

## 2016-03-17 DIAGNOSIS — D0461 Carcinoma in situ of skin of right upper limb, including shoulder: Secondary | ICD-10-CM | POA: Diagnosis not present

## 2016-03-17 DIAGNOSIS — D0462 Carcinoma in situ of skin of left upper limb, including shoulder: Secondary | ICD-10-CM | POA: Diagnosis not present

## 2016-03-18 ENCOUNTER — Ambulatory Visit (INDEPENDENT_AMBULATORY_CARE_PROVIDER_SITE_OTHER): Payer: PPO | Admitting: *Deleted

## 2016-03-18 DIAGNOSIS — I639 Cerebral infarction, unspecified: Secondary | ICD-10-CM | POA: Diagnosis not present

## 2016-03-18 NOTE — Progress Notes (Signed)
Carelink Summary Report / Loop Recorder 

## 2016-03-30 DIAGNOSIS — R079 Chest pain, unspecified: Secondary | ICD-10-CM | POA: Diagnosis not present

## 2016-03-30 DIAGNOSIS — L509 Urticaria, unspecified: Secondary | ICD-10-CM | POA: Diagnosis not present

## 2016-03-30 DIAGNOSIS — T7840XA Allergy, unspecified, initial encounter: Secondary | ICD-10-CM | POA: Diagnosis not present

## 2016-03-30 DIAGNOSIS — E876 Hypokalemia: Secondary | ICD-10-CM | POA: Diagnosis not present

## 2016-04-07 ENCOUNTER — Telehealth: Payer: Self-pay | Admitting: *Deleted

## 2016-04-07 DIAGNOSIS — I480 Paroxysmal atrial fibrillation: Secondary | ICD-10-CM

## 2016-04-07 NOTE — Telephone Encounter (Signed)
Spoke with patient regarding AF episode on ILR from 03/30/16.  Reviewed episode with Chanetta Marshall, NP--true AF.  Patient states she was on vacation at the time and was away from her home monitor.  Patient reports that she had an allergic reaction, possibly to a steak she ate, took PO Bendadryl x3, and presented to the ED in [place].  There she was treated with additional Benadryl, epinephrine, and Zantac.  Patient states that she reacted poorly to the epi and felt her heart racing and her chest tightening.  She states that they had to administer something else to control her HR.  Patient states that she meant to call our office regarding this episode, but that she hasn't had a chance.   Patient is agreeable to an appointment with Chanetta Marshall, NP in the [facility] on 04/10/16 at 10:30am.  Patient is aware of directions and parking code.  She is appreciative of call and denies additional questions or concerns at this time.

## 2016-04-10 ENCOUNTER — Ambulatory Visit (HOSPITAL_COMMUNITY)
Admission: RE | Admit: 2016-04-10 | Discharge: 2016-04-10 | Disposition: A | Discharge status: Home / Self Care | Payer: PPO | Source: Ambulatory Visit | Attending: Nurse Practitioner | Admitting: Nurse Practitioner

## 2016-04-10 ENCOUNTER — Encounter (HOSPITAL_COMMUNITY): Payer: Self-pay | Admitting: Nurse Practitioner

## 2016-04-10 DIAGNOSIS — G43909 Migraine, unspecified, not intractable, without status migrainosus: Secondary | ICD-10-CM | POA: Insufficient documentation

## 2016-04-10 DIAGNOSIS — K219 Gastro-esophageal reflux disease without esophagitis: Secondary | ICD-10-CM | POA: Diagnosis not present

## 2016-04-10 DIAGNOSIS — Z9889 Other specified postprocedural states: Secondary | ICD-10-CM | POA: Diagnosis not present

## 2016-04-10 DIAGNOSIS — I639 Cerebral infarction, unspecified: Secondary | ICD-10-CM

## 2016-04-10 DIAGNOSIS — Z880 Allergy status to penicillin: Secondary | ICD-10-CM | POA: Diagnosis not present

## 2016-04-10 DIAGNOSIS — I10 Essential (primary) hypertension: Secondary | ICD-10-CM | POA: Diagnosis not present

## 2016-04-10 DIAGNOSIS — I2510 Atherosclerotic heart disease of native coronary artery without angina pectoris: Secondary | ICD-10-CM | POA: Diagnosis not present

## 2016-04-10 DIAGNOSIS — E785 Hyperlipidemia, unspecified: Secondary | ICD-10-CM | POA: Diagnosis not present

## 2016-04-10 DIAGNOSIS — Z9851 Tubal ligation status: Secondary | ICD-10-CM | POA: Insufficient documentation

## 2016-04-10 DIAGNOSIS — Z886 Allergy status to analgesic agent status: Secondary | ICD-10-CM | POA: Insufficient documentation

## 2016-04-10 DIAGNOSIS — Z800 Family history of malignant neoplasm of digestive organs: Secondary | ICD-10-CM | POA: Insufficient documentation

## 2016-04-10 DIAGNOSIS — Z888 Allergy status to other drugs, medicaments and biological substances status: Secondary | ICD-10-CM | POA: Insufficient documentation

## 2016-04-10 DIAGNOSIS — Z91030 Bee allergy status: Secondary | ICD-10-CM | POA: Insufficient documentation

## 2016-04-10 DIAGNOSIS — Z7984 Long term (current) use of oral hypoglycemic drugs: Secondary | ICD-10-CM | POA: Diagnosis not present

## 2016-04-10 DIAGNOSIS — Z801 Family history of malignant neoplasm of trachea, bronchus and lung: Secondary | ICD-10-CM | POA: Insufficient documentation

## 2016-04-10 DIAGNOSIS — Z85828 Personal history of other malignant neoplasm of skin: Secondary | ICD-10-CM | POA: Insufficient documentation

## 2016-04-10 DIAGNOSIS — Z8673 Personal history of transient ischemic attack (TIA), and cerebral infarction without residual deficits: Secondary | ICD-10-CM | POA: Insufficient documentation

## 2016-04-10 DIAGNOSIS — Z8249 Family history of ischemic heart disease and other diseases of the circulatory system: Secondary | ICD-10-CM | POA: Insufficient documentation

## 2016-04-10 DIAGNOSIS — Z86010 Personal history of colonic polyps: Secondary | ICD-10-CM | POA: Insufficient documentation

## 2016-04-10 DIAGNOSIS — I480 Paroxysmal atrial fibrillation: Secondary | ICD-10-CM | POA: Insufficient documentation

## 2016-04-10 DIAGNOSIS — Z808 Family history of malignant neoplasm of other organs or systems: Secondary | ICD-10-CM | POA: Insufficient documentation

## 2016-04-10 DIAGNOSIS — Z885 Allergy status to narcotic agent status: Secondary | ICD-10-CM | POA: Diagnosis not present

## 2016-04-10 DIAGNOSIS — E119 Type 2 diabetes mellitus without complications: Secondary | ICD-10-CM | POA: Insufficient documentation

## 2016-04-10 NOTE — Progress Notes (Signed)
Primary Care Physician: Loura Pardon, MD Primary Electrophysiologist: Caryl Comes Referring Physician: Maudry Diego Margaret Vazquez is a 80 y.o. female with a history of cryptogenic stroke, hypertension, hyperlipidemia, and diabetes. She underwent ILR implant 02/2013 at time of stroke. She recently had atrial fibrillation discovered and presents today to discuss anticoagulation options in the [facility].  Her episode of atrial fibrillation was detected while she was being treated for anaphylactic reaction in the ER. She had just received Epinephrine, Prednisone, Benadryl, and Zantac.  She has not had prior atrial fibrillation identified.   Today, she denies symptoms of palpitations, chest pain, shortness of breath, orthopnea, PND, lower extremity edema, dizziness, presyncope, syncope, snoring, daytime somnolence, bleeding, or neurologic sequela. The patient is tolerating medications without difficulties and is otherwise without complaint today.    Atrial Fibrillation Risk Factors:  she does not have symptoms or diagnosis of sleep apnea.  she does not have a history of rheumatic fever.  she does not have a history of alcohol use.  she has a BMI of Body mass index is 29.88 kg/m.Marland Kitchen Filed Weights   04/10/16 1042  Weight: 199 lb 6.4 oz (90.4 kg)    LA size: 44   Past Medical History:  Diagnosis Date  . Allergy history, drug    Aspirin  . CAD (coronary artery disease) 04/08/11   non obst by cath  . Cervical stenosis of spine    With neck pain  . Colon polyps 2009  . Diabetes mellitus    type II  . GERD (gastroesophageal reflux disease)   . Hyperlipidemia    myalgias with Lipitor and Zetia  . Hypertension   . Migraine   . Skin cancer    hx of basal cell/ak's  . Stroke [facility]) August 2014  . TIA (transient ischemic attack)    Past Surgical History:  Procedure Laterality Date  . ABD [place]  07/2003   Negative  . APPENDECTOMY    . BREAST SURGERY  1992   breast  biopsy  . CARDIAC CATHETERIZATION  04/07/2011   non obst CAD (Dr Burt Knack)  . COLONOSCOPY  12/2007   Adenomatous colon polyps  . CYSTOSCOPY W/ DECANNULATION  03/2000   Normal  . EYE SURGERY  2005   tear duct surgery  . LOOP RECORDER IMPLANT N/A 02/16/2013   Procedure: LOOP RECORDER IMPLANT;  Surgeon: Deboraha Sprang, MD;  Location: [facility] C[facility] LAB;  Service: Cardiovascular;  Laterality: N/A;  . NASAL SINUS SURGERY  01/2005  . STRESS CARDIOLITE  11/1999   Normal/ negative  . [place] DUCT SURGERY  2005  . TEE WITHOUT CARDIOVERSION N/A 02/16/2013   Procedure: TRANSESOPHAGEAL ECHOCARDIOGRAM (TEE);  Surgeon: Josue Hector, MD;  Location: [facility] ENDOSCOPY;  Service: Cardiovascular;  Laterality: N/A;  . TUBAL LIGATION     BTL    Current Outpatient Prescriptions  Medication Sig Dispense Refill  . diphenhydrAMINE (BENADRYL) 25 MG tablet Take 50 mg by mouth every 8 (eight) hours as needed for allergies.     Marland Kitchen EPINEPHrine (EPI-PEN) 0.3 mg/0.3 mL DEVI Inject 0.3 mg into the muscle daily as needed (allergic reaction).     . fluticasone (FLONASE) 50 MCG/ACT nasal spray Place 2 sprays into both nostrils 2 (two) times daily as needed. Reported on 09/10/2015    . glipiZIDE (GLUCOTROL XL) 10 MG 24 hr tablet Take 1 tablet (10 mg total) by mouth daily with breakfast. 30 tablet 11  . glucose blood (ONE TOUCH ULTRA TEST)  test strip CHECK BLOOD SUGAR ONCE DAILY AND AS NEEDED (DX. E11.9) 100 each 0  . losartan-hydrochlorothiazide (HYZAAR) 100-25 MG tablet Take 1 tablet by mouth daily. 30 tablet 11  . potassium chloride SA (K-DUR,KLOR-CON) 20 MEQ tablet 1 tablet daily.    . ticlopidine (TICLID) 250 MG tablet Take 1 tablet (250 mg total) by mouth 2 (two) times daily with a meal. 180 tablet 0  . vitamin B-12 (CYANOCOBALAMIN) 1000 MCG tablet Take 1,000 mcg by mouth daily.     No current facility-administered medications for this encounter.     Allergies  Allergen Reactions  . Bee Venom Hives, Shortness Of Breath and  Swelling  . Nabumetone Anaphylaxis  . Amoxicillin-Pot Clavulanate Hives and Swelling    To lips.  . Aspirin Hives  . Atorvastatin Swelling     joint pain/swelling, inc liver tests  . Clopidogrel Bisulfate Hives  . Codeine Nausea And Vomiting  . Ezetimibe Other (See Comments)     fatigue  . Metformin And Related Other (See Comments)    Diarrhea   . Valsartan Other (See Comments)     fatigue    Social History   Social History  . Marital status: Married    Spouse name: Eddie Dibbles  . Number of children: 3  . Years of education: 12   Occupational History  . Retired Retired   Social History Main Topics  . Smoking status: Never Smoker  . Smokeless tobacco: Never Used  . Alcohol use No  . Drug use: No  . Sexual activity: No   Other Topics Concern  . Not on file   Social History Narrative   3 children   Does not drink caffeinated beverages   Cares for SIL with dementia    Family History  Problem Relation Age of Onset  . Lung cancer Brother   . Colon cancer Neg Hx   . Skin cancer Daughter   . Diabetes Sister   . Diabetes Brother   . Pancreatic cancer Brother   . Heart disease Mother   . Heart disease Father   . Brain cancer Other     ROS- All systems are reviewed and negative except as per the HPI above.  Physical Exam: Vitals:   04/10/16 1042  BP: (!) 156/84  Pulse: 60  Weight: 199 lb 6.4 oz (90.4 kg)  Height: 5' 8.5" (1.74 m)    GEN- The patient is elderly appearing, alert and oriented x 3 today.   Head- normocephalic, atraumatic Eyes-  Sclera clear, conjunctiva pink Ears- hearing intact Oropharynx- clear Neck- supple  Lungs- Clear to ausculation bilaterally, normal work of breathing Heart- Regular rate and rhythm, no murmurs, rubs or gallops  GI- soft, NT, ND, + BS Extremities- no clubbing, cyanosis, or edema MS- no significant deformity or atrophy Skin- no rash or lesion Psych- euthymic mood, full affect Neuro- strength and sensation are  intact  Wt Readings from Last 3 Encounters:  04/10/16 199 lb 6.4 oz (90.4 kg)  02/19/16 198 lb 8 oz (90 kg)  11/19/15 200 lb 12.8 oz (91.1 kg)    EKG today demonstrates sinus rhythm, rate 60 Echo 01/2014 demonstrated EF 60-65%, no RWMA, grade 1 diastolic dysfunction, LA 44  Epic records are reviewed at length today  Assessment and Plan:  1. Paroxysmal atrial fibrillation The patient has newly diagnosed paroxysmal atrial fibrillation on ILR.  She was symptomatic with palpitations and the episode occurred in the setting of being treated for anaphylactic reaction.   This  patients CHA2DS2-VASc Score and unadjusted Ischemic Stroke Rate (% per year) is equal to 11.2 % stroke rate/year from a score of 7 Above score calculated as 1 point each if present [CHF, HTN, DM, Vascular=MI/PAD/Aortic Plaque, Age if 65-74, or Female] Above score calculated as 2 points each if present [Age > 75, or Stroke/TIA/TE] Discussed with Dr Leonie Man who knows her well.  He felt that without unprovoked atrial fibrillation, there is currently no indication for anticoagulation. Will continue remote monitoring for now. I have asked her to keep her appt with Dr Caryl Comes in November. ILR is nearing ERI and decision will need to be made about removal vs replacing device at that time.    Chanetta Marshall, NP 04/10/2016 11:51 AM

## 2016-04-12 LAB — CUP PACEART REMOTE DEVICE CHECK: Date Time Interrogation Session: 20170912120545

## 2016-04-12 NOTE — Progress Notes (Signed)
Carelink summary report received. Battery status OK. Normal device function. No new symptom episodes, tachy episodes, brady, or pause episodes. No new AF episodes. Monthly summary reports and ROV/PRN 

## 2016-04-14 ENCOUNTER — Telehealth: Payer: Self-pay

## 2016-04-14 NOTE — Telephone Encounter (Signed)
Pt left a message on Triage Line stating she was seen in the ER in [place] 2 weeks ago with a reaction to red meat. She had labs done that showed low potassium. The ER doctor prescribed her 2 weeks of potassium to take and suggested she follow-up with labs to see if it had gotten better. Pt took her last potassium today. She is asking if Dr Glori Bickers wants to see her or if she just wants to order labs. Please let pt know.

## 2016-04-14 NOTE — Telephone Encounter (Signed)
Lab appt scheduled and request for medical records sent

## 2016-04-14 NOTE — Telephone Encounter (Signed)
Send for note from [place] please  Order bmp for dx (hypokalemia) Thanks

## 2016-04-15 ENCOUNTER — Other Ambulatory Visit (INDEPENDENT_AMBULATORY_CARE_PROVIDER_SITE_OTHER): Payer: PPO

## 2016-04-15 DIAGNOSIS — E876 Hypokalemia: Secondary | ICD-10-CM

## 2016-04-15 LAB — BASIC METABOLIC PANEL
BUN: 18 mg/dL (ref 6–23)
CO2: 28 mEq/L (ref 19–32)
Calcium: 9.1 mg/dL (ref 8.4–10.5)
Chloride: 105 mEq/L (ref 96–112)
Creatinine, Ser: 1.01 mg/dL (ref 0.40–1.20)
GFR: 56.26 mL/min — ABNORMAL LOW (ref >60.00)
Glucose, Bld: 115 mg/dL — ABNORMAL HIGH (ref 70–99)
Potassium: 4.1 mEq/L (ref 3.5–5.1)
Sodium: 142 mEq/L (ref 135–145)

## 2016-04-17 ENCOUNTER — Ambulatory Visit (INDEPENDENT_AMBULATORY_CARE_PROVIDER_SITE_OTHER): Payer: PPO | Admitting: *Deleted

## 2016-04-17 DIAGNOSIS — I639 Cerebral infarction, unspecified: Secondary | ICD-10-CM

## 2016-04-17 NOTE — Progress Notes (Signed)
Carelink Summary Report / Loop Recorder 

## 2016-04-18 ENCOUNTER — Telehealth: Payer: Self-pay | Admitting: Family Medicine

## 2016-04-18 MED ORDER — POTASSIUM CHLORIDE CRYS ER 20 MEQ PO TBCR: 20.0000 meq | EXTENDED_RELEASE_TABLET | Freq: Every day | ORAL | 1 refills | Status: DC

## 2016-04-18 NOTE — Telephone Encounter (Signed)
Pt called back about labs please call [phone] Thanks

## 2016-04-18 NOTE — Telephone Encounter (Signed)
Left voicemail requesting pt to call the office back 

## 2016-04-18 NOTE — Telephone Encounter (Signed)
Pt left v/m returning call and request cb [phone].

## 2016-04-18 NOTE — Telephone Encounter (Signed)
Notes received and placed in your inbox

## 2016-04-18 NOTE — Telephone Encounter (Signed)
-----   Message from Abner Greenspan, MD sent at 04/15/2016 10:25 PM EDT ----- K is ok  Please find out what dose she is on so we can continue it  thanks

## 2016-04-18 NOTE — Telephone Encounter (Signed)
Pt notified of lab results and Dr. Marliss Coots comments and Rx sent to pharmacy

## 2016-05-01 ENCOUNTER — Encounter: Payer: Self-pay | Admitting: Internal Medicine

## 2016-05-08 ENCOUNTER — Other Ambulatory Visit: Payer: Self-pay | Admitting: Family Medicine

## 2016-05-14 ENCOUNTER — Ambulatory Visit (INDEPENDENT_AMBULATORY_CARE_PROVIDER_SITE_OTHER): Payer: PPO | Admitting: Internal Medicine

## 2016-05-14 ENCOUNTER — Encounter: Payer: Self-pay | Admitting: Internal Medicine

## 2016-05-14 VITALS — BP 120/66 | HR 73 | Ht 68.5 in | Wt 203.2 lb

## 2016-05-14 DIAGNOSIS — I639 Cerebral infarction, unspecified: Secondary | ICD-10-CM | POA: Diagnosis not present

## 2016-05-14 DIAGNOSIS — I480 Paroxysmal atrial fibrillation: Secondary | ICD-10-CM

## 2016-05-14 NOTE — Progress Notes (Signed)
[email or web address]      Patient Care Team: Abner Greenspan, MD as PCP - General   HPI  Margaret Vazquez is a 80 y.o. female Seen in followup for a loop recorder implanted for cryptogenic stroke. She has significant hypertension which is much improved  She recently was found to have atrial fibrillation that prompted a visit to the atrial fibrillation clinic. It turned out that this event occurred in the context of treatment anaphylaxis. Discussion with Dr. Leonie Man suggested no indication for anticoauglation   No interval palpitations.   Past Medical History:  Diagnosis Date  . Allergy history, drug    Aspirin  . CAD (coronary artery disease) 04/08/11   non obst by cath  . Cervical stenosis of spine    With neck pain  . Colon polyps 2009  . Diabetes mellitus    type II  . GERD (gastroesophageal reflux disease)   . Hyperlipidemia    myalgias with Lipitor and Zetia  . Hypertension   . Migraine   . Skin cancer    hx of basal cell/ak's  . Stroke [facility]) August 2014  . TIA (transient ischemic attack)     Past Surgical History:  Procedure Laterality Date  . ABD [place]  07/2003   Negative  . APPENDECTOMY    . BREAST SURGERY  1992   breast biopsy  . CARDIAC CATHETERIZATION  04/07/2011   non obst CAD (Dr Burt Knack)  . COLONOSCOPY  12/2007   Adenomatous colon polyps  . CYSTOSCOPY W/ DECANNULATION  03/2000   Normal  . EYE SURGERY  2005   tear duct surgery  . LOOP RECORDER IMPLANT N/A 02/16/2013   Procedure: LOOP RECORDER IMPLANT;  Surgeon: Deboraha Sprang, MD;  Location[facility] CATH LAB;  Service: Cardiovascular;  Laterality: N/A;  . NASAL SINUS SURGERY  01/2005  . STRESS CARDIOLITE  11/1999   Normal/ negative  . [place] DUCT SURGERY  2005  . TEE WITHOUT CARDIOVERSION N/A 02/16/2013   Procedure: TRANSESOPHAGEAL ECHOCARDIOGRAM (TEE);  Surgeon: Josue Hector, MD;  Location: [facility] ENDOSCOPY;  Service: Cardiovascular;  Laterality: N/A;  . TUBAL LIGATION     BTL    Current Outpatient Prescriptions    Medication Sig Dispense Refill  . diphenhydrAMINE (BENADRYL) 25 MG tablet Take 50 mg by mouth every 8 (eight) hours as needed for allergies.     Marland Kitchen EPINEPHrine (EPI-PEN) 0.3 mg/0.3 mL DEVI Inject 0.3 mg into the muscle daily as needed (allergic reaction).     . fluticasone (FLONASE) 50 MCG/ACT nasal spray Place 2 sprays into both nostrils 2 (two) times daily as needed. Reported on 09/10/2015    . glipiZIDE (GLUCOTROL XL) 10 MG 24 hr tablet Take 1 tablet (10 mg total) by mouth daily with breakfast. 30 tablet 11  . glucose blood (ONE TOUCH ULTRA TEST) test strip CHECK BLOOD SUGAR ONCE DAILY AND AS NEEDED (DX. E11.9) 100 each 1  . losartan-hydrochlorothiazide (HYZAAR) 100-25 MG tablet Take 1 tablet by mouth daily. 30 tablet 11  . potassium chloride SA (K-DUR,KLOR-CON) 20 MEQ tablet Take 1 tablet (20 mEq total) by mouth daily. 90 tablet 1  . ticlopidine (TICLID) 250 MG tablet Take 1 tablet (250 mg total) by mouth 2 (two) times daily with a meal. 180 tablet 0  . vitamin B-12 (CYANOCOBALAMIN) 1000 MCG tablet Take 1,000 mcg by mouth daily.     No current facility-administered medications for this visit.     Allergies  Allergen Reactions  . Bee Venom Hives, Shortness  Of Breath and Swelling  . Nabumetone Anaphylaxis  . Amoxicillin-Pot Clavulanate Hives and Swelling    To lips.  . Aspirin Hives  . Atorvastatin Swelling     joint pain/swelling, inc liver tests  . Clopidogrel Bisulfate Hives  . Codeine Nausea And Vomiting  . Ezetimibe Other (See Comments)     fatigue  . Metformin And Related Other (See Comments)    Diarrhea   . Valsartan Other (See Comments)     fatigue  . Other Hives    **Red Meat**    Review of Systems negative except from HPI and PMH  Physical Exam BP 120/66 (BP Location: Left Arm, Patient Position: Sitting, Cuff Size: Large)   Pulse 73   Ht 5' 8.5" (1.74 m)   Wt 203 lb 3.2 oz (92.2 kg)   SpO2 96%   BMI 30.45 kg/m  Well developed and well nourished in no acute  distress HENT normal E scleral and icterus clear Neck Supple JVP flat; carotids brisk and full Clear to ausculation  Device pocket well healed; without hematoma or erythema.  There is no tethering   Regular rate and rhythm, no murmurs gallops or rub Soft with active bowel sounds No clubbing cyanosis no Edema Alert and oriented, grossly normal motor and sensory function Skin Warm and Dry  ECG demonstrates sinus rhythm at 69 intervals 14/08/40 Otherwise normal  Assessment and  Plan  Cryptogenic stroke  Implantable loop recorder  Elevated blood pressure  Blood pressures are well controlled  Atrial fibrillation was secondary to epinephrine and we'll continue to follow without anticoagulation  Loop recorder interrogated no further atrial fibrillation

## 2016-05-14 NOTE — Patient Instructions (Signed)
Medication Instructions: Your physician recommends that you continue on your current medications as directed. Please refer to the Current Medication list given to you today.   Labwork: None Ordered  Procedures/Testing: None Ordered  Follow-Up: Your physician wants you to follow-up in August 2018 with Chanetta Marshall, NP. You will receive a reminder letter in the mail two months in advance. If you don't receive a letter, please call our office to schedule the follow-up appointment.  Your physician wants you to follow-up in 1 Year with Dr. Caryl Comes. You will receive a reminder letter in the mail two months in advance. If you don't receive a letter, please call our office to schedule the follow-up appointment.   Any Additional Special Instructions Will Be Listed Below (If Applicable).     If you need a refill on your cardiac medications before your next appointment, please call your pharmacy.

## 2016-05-18 LAB — CUP PACEART REMOTE DEVICE CHECK
Date Time Interrogation Session: 20171012123706
Implantable Pulse Generator Implant Date: 20140813

## 2016-05-18 NOTE — Progress Notes (Signed)
Carelink summary report received. Battery status OK. Normal device function. No new symptom episodes, tachy episodes, brady, or pause episodes. 1 AF episode, see office visit note 04/10/16. Monthly summary reports and ROV/PRN

## 2016-05-19 ENCOUNTER — Ambulatory Visit (INDEPENDENT_AMBULATORY_CARE_PROVIDER_SITE_OTHER): Payer: PPO | Admitting: *Deleted

## 2016-05-19 DIAGNOSIS — I639 Cerebral infarction, unspecified: Secondary | ICD-10-CM | POA: Diagnosis not present

## 2016-05-20 NOTE — Progress Notes (Signed)
Carelink Summary Report / Loop Recorder 

## 2016-06-02 DIAGNOSIS — D485 Neoplasm of uncertain behavior of skin: Secondary | ICD-10-CM | POA: Diagnosis not present

## 2016-06-02 DIAGNOSIS — L570 Actinic keratosis: Secondary | ICD-10-CM | POA: Diagnosis not present

## 2016-06-02 DIAGNOSIS — D0462 Carcinoma in situ of skin of left upper limb, including shoulder: Secondary | ICD-10-CM | POA: Diagnosis not present

## 2016-06-02 DIAGNOSIS — Z85828 Personal history of other malignant neoplasm of skin: Secondary | ICD-10-CM | POA: Diagnosis not present

## 2016-06-16 ENCOUNTER — Ambulatory Visit (INDEPENDENT_AMBULATORY_CARE_PROVIDER_SITE_OTHER): Payer: PPO | Admitting: *Deleted

## 2016-06-16 DIAGNOSIS — I639 Cerebral infarction, unspecified: Secondary | ICD-10-CM | POA: Diagnosis not present

## 2016-06-17 NOTE — Progress Notes (Signed)
Carelink Summary Report / Loop Recorder 

## 2016-06-18 DIAGNOSIS — D0462 Carcinoma in situ of skin of left upper limb, including shoulder: Secondary | ICD-10-CM | POA: Diagnosis not present

## 2016-07-02 LAB — CUP PACEART REMOTE DEVICE CHECK
Date Time Interrogation Session: 20171111130708
Implantable Pulse Generator Implant Date: 20140813

## 2016-07-05 ENCOUNTER — Emergency Department
Admission: EM | Admit: 2016-07-05 | Discharge: 2016-07-05 | Disposition: A | Discharge status: Home / Self Care | Payer: PPO | Attending: Emergency Medicine | Admitting: Emergency Medicine

## 2016-07-05 ENCOUNTER — Emergency Department: Payer: PPO

## 2016-07-05 ENCOUNTER — Encounter: Payer: Self-pay | Admitting: Emergency Medicine

## 2016-07-05 DIAGNOSIS — R197 Diarrhea, unspecified: Secondary | ICD-10-CM | POA: Insufficient documentation

## 2016-07-05 DIAGNOSIS — Z85828 Personal history of other malignant neoplasm of skin: Secondary | ICD-10-CM | POA: Insufficient documentation

## 2016-07-05 DIAGNOSIS — I10 Essential (primary) hypertension: Secondary | ICD-10-CM | POA: Insufficient documentation

## 2016-07-05 DIAGNOSIS — E119 Type 2 diabetes mellitus without complications: Secondary | ICD-10-CM | POA: Insufficient documentation

## 2016-07-05 DIAGNOSIS — I2510 Atherosclerotic heart disease of native coronary artery without angina pectoris: Secondary | ICD-10-CM | POA: Diagnosis not present

## 2016-07-05 DIAGNOSIS — R1013 Epigastric pain: Secondary | ICD-10-CM | POA: Diagnosis not present

## 2016-07-05 DIAGNOSIS — R112 Nausea with vomiting, unspecified: Secondary | ICD-10-CM | POA: Diagnosis not present

## 2016-07-05 LAB — CBC
HCT: 44.2 % (ref 35.0–47.0)
Hemoglobin: 15.2 g/dL (ref 12.0–16.0)
MCH: 32.8 pg (ref 26.0–34.0)
MCHC: 34.4 g/dL (ref 32.0–36.0)
MCV: 95.3 fL (ref 80.0–100.0)
Platelets: 249 10*3/uL (ref 150–440)
RBC: 4.64 MIL/uL (ref 3.80–5.20)
RDW: 13.3 % (ref 11.5–14.5)
WBC: 10.5 10*3/uL (ref 3.6–11.0)

## 2016-07-05 LAB — COMPREHENSIVE METABOLIC PANEL
ALT: 16 U/L (ref 14–54)
AST: 21 U/L (ref 15–41)
Albumin: 4 g/dL (ref 3.5–5.0)
Alkaline Phosphatase: 85 U/L (ref 38–126)
Anion gap: 9 (ref 5–15)
BUN: 18 mg/dL (ref 6–20)
CO2: 26 mmol/L (ref 22–32)
Calcium: 9.1 mg/dL (ref 8.9–10.3)
Chloride: 103 mmol/L (ref 101–111)
Creatinine, Ser: 1.06 mg/dL — ABNORMAL HIGH (ref 0.44–1.00)
GFR calc Af Amer: 57 mL/min — ABNORMAL LOW (ref >60)
GFR calc non Af Amer: 49 mL/min — ABNORMAL LOW (ref >60)
Glucose, Bld: 156 mg/dL — ABNORMAL HIGH (ref 65–99)
Potassium: 4.2 mmol/L (ref 3.5–5.1)
Sodium: 138 mmol/L (ref 135–145)
Total Bilirubin: 0.6 mg/dL (ref 0.3–1.2)
Total Protein: 7.8 g/dL (ref 6.5–8.1)

## 2016-07-05 LAB — URINALYSIS, COMPLETE (UACMP) WITH MICROSCOPIC
Bacteria, UA: NONE SEEN
Bilirubin Urine: NEGATIVE
Glucose, UA: NEGATIVE mg/dL
Ketones, ur: NEGATIVE mg/dL
Leukocytes, UA: NEGATIVE
Nitrite: NEGATIVE
Protein, ur: 30 mg/dL — AB
Specific Gravity, Urine: 1.025 (ref 1.005–1.030)
pH: 5 (ref 5.0–8.0)

## 2016-07-05 LAB — GLUCOSE, CAPILLARY
Glucose-Capillary: 145 mg/dL — ABNORMAL HIGH (ref 65–99)
Glucose-Capillary: 146 mg/dL — ABNORMAL HIGH (ref 65–99)

## 2016-07-05 LAB — TROPONIN I: Troponin I: <0.03 ng/mL (ref <0.03)

## 2016-07-05 LAB — LIPASE, BLOOD: Lipase: 17 U/L (ref 11–51)

## 2016-07-05 MED ORDER — IOPAMIDOL (ISOVUE-300) INJECTION 61%
100.0000 mL | Freq: Once | INTRAVENOUS | Status: AC | PRN
  Administered 2016-07-05: 100 mL via INTRAVENOUS

## 2016-07-05 MED ORDER — ONDANSETRON HCL 4 MG PO TABS: 4.0000 mg | ORAL_TABLET | Freq: Three times a day (TID) | ORAL | 0 refills | Status: DC | PRN

## 2016-07-05 MED ORDER — GI COCKTAIL ~~LOC~~
30.0000 mL | Freq: Once | ORAL | Status: AC
  Administered 2016-07-05: 30 mL via ORAL
  Filled 2016-07-05: qty 30

## 2016-07-05 MED ORDER — IOPAMIDOL (ISOVUE-300) INJECTION 61%
30.0000 mL | Freq: Once | INTRAVENOUS | Status: AC | PRN
  Administered 2016-07-05: 30 mL via ORAL

## 2016-07-05 NOTE — ED Notes (Signed)
FIRST NURSE NOTE: Pt c/o epigastric pain that started this morning radiating to her back. Pt vomited one time about 30 minutes prior to arrival, abdomen distended as well.

## 2016-07-05 NOTE — ED Provider Notes (Addendum)
[facility] Emergency Department Provider Note  ____________________________________________   I have reviewed the triage vital signs and the nursing notes.   HISTORY  Chief Complaint Abdominal Pain and Emesis    HPI Margaret Vazquez is a 80 y.o. female resents today complaining of nausea vomiting and diarrhea. She also has epigastric abdominal discomfort. She feels bloated and "gassy". Felt fine when she woke up. Her first bowel movement this morning was normal. No melena no bright red blood per rectum no hematemesis. No fever no chills no dysuria no urinary frequency no chest pain or shortness of breath.   Past Medical History:  Diagnosis Date  . Allergy history, drug    Aspirin  . CAD (coronary artery disease) 04/08/11   non obst by cath  . Cervical stenosis of spine    With neck pain  . Colon polyps 2009  . Diabetes mellitus    type II  . GERD (gastroesophageal reflux disease)   . Hyperlipidemia    myalgias with Lipitor and Zetia  . Hypertension   . Migraine   . Skin cancer    hx of basal cell/ak's  . Stroke [facility]) August 2014  . TIA (transient ischemic attack)     Patient Active Problem List   Diagnosis Date Noted  . Paroxysmal atrial fibrillation ([place]) 04/07/2016  . Fall 08/13/2015  . Chest wall pain 08/13/2015  . Left ankle pain 08/13/2015  . Left knee pain 06/14/2015  . Chest discomfort 11/22/2014  . Overweight (BMI 25.0-29.9) 08/23/2014  . Type 2 diabetes mellitus with vascular disease ([place]) 01/12/2014  . TIA (transient ischemic attack) 01/11/2014  . CVA (cerebral infarction) 01/11/2014  . Sensory disturbance 01/11/2014  . Pain of right thumb 11/14/2013  . Left hip pain 11/14/2013  . Chest pain at rest 08/31/2013  . LOOP Recorder LINQ 06/07/2013  . Fatigue 06/07/2013  . Sinusitis, chronic 05/16/2013  . Acute ischemic stroke ([place]) 02/15/2013  . Numbness and tingling of right arm 02/14/2013  . Encounter for Medicare annual  wellness exam 10/27/2012  . Encopresis(307.7) 12/15/2011  . Post-menopausal 10/28/2011  . PERSONAL HX COLONIC POLYPS 09/04/2009  . B12 deficiency 03/06/2009  . UNSPECIFIED ANEMIA 02/13/2009  . ALLERGIC RHINITIS 02/14/2008  . BACK PAIN, LUMBAR 11/03/2007  . Hyperlipidemia 11/24/2006  . Essential hypertension 11/24/2006  . Diabetes type 2, controlled ([place]) 10/09/2006  . GERD 10/09/2006  . OVERACTIVE BLADDER 10/09/2006  . INCONTINENCE, URGE 10/09/2006  . SKIN CANCER, HX OF 10/09/2006    Past Surgical History:  Procedure Laterality Date  . ABD [place]  07/2003   Negative  . APPENDECTOMY    . BREAST SURGERY  1992   breast biopsy  . CARDIAC CATHETERIZATION  04/07/2011   non obst CAD (Dr Burt Knack)  . COLONOSCOPY  12/2007   Adenomatous colon polyps  . CYSTOSCOPY W/ DECANNULATION  03/2000   Normal  . EYE SURGERY  2005   tear duct surgery  . LOOP RECORDER IMPLANT N/A 02/16/2013   Procedure: LOOP RECORDER IMPLANT;  Surgeon: Deboraha Sprang, MD;  Location: [facility] CATH LAB;  Service: Cardiovascular;  Laterality: N/A;  . NASAL SINUS SURGERY  01/2005  . STRESS CARDIOLITE  11/1999   Normal/ negative  . [place] DUCT SURGERY  2005  . TEE WITHOUT CARDIOVERSION N/A 02/16/2013   Procedure: TRANSESOPHAGEAL ECHOCARDIOGRAM (TEE);  Surgeon: Josue Hector, MD;  Location: [facility] ENDOSCOPY;  Service: Cardiovascular;  Laterality: N/A;  . TUBAL LIGATION     BTL    Prior  to Admission medications   Medication Sig Start Date End Date Taking? Authorizing Provider  diphenhydrAMINE (BENADRYL) 25 MG tablet Take 50 mg by mouth every 8 (eight) hours as needed for allergies.    Yes Historical Provider, MD  EPINEPHrine (EPI-PEN) 0.3 mg/0.3 mL DEVI Inject 0.3 mg into the muscle daily as needed (allergic reaction).  12/20/10  Yes Marne A Tower, MD  glucose blood (ONE TOUCH ULTRA TEST) test strip CHECK BLOOD SUGAR ONCE DAILY AND AS NEEDED (DX. E11.9) 05/08/16  Yes Abner Greenspan, MD  fluticasone (FLONASE) 50 MCG/ACT nasal spray Place  2 sprays into both nostrils 2 (two) times daily as needed. Reported on 09/10/2015 01/20/14   Historical Provider, MD  glipiZIDE (GLUCOTROL XL) 10 MG 24 hr tablet Take 1 tablet (10 mg total) by mouth daily with breakfast. 08/22/15   Abner Greenspan, MD  losartan-hydrochlorothiazide (HYZAAR) 100-25 MG tablet Take 1 tablet by mouth daily. 11/19/15   Abner Greenspan, MD  potassium chloride SA (K-DUR,KLOR-CON) 20 MEQ tablet Take 1 tablet (20 mEq total) by mouth daily. 04/18/16   Abner Greenspan, MD  ticlopidine (TICLID) 250 MG tablet Take 1 tablet (250 mg total) by mouth 2 (two) times daily with a meal. 07/24/14   Garvin Fila, MD  vitamin B-12 (CYANOCOBALAMIN) 1000 MCG tablet Take 1,000 mcg by mouth daily.    Historical Provider, MD    Allergies Bee venom; Nabumetone; Amoxicillin-pot clavulanate; Aspirin; Atorvastatin; Clopidogrel bisulfate; Codeine; Ezetimibe; Metformin and related; Valsartan; and Other  Family History  Problem Relation Age of Onset  . Lung cancer Brother   . Diabetes Brother   . Pancreatic cancer Brother   . Heart disease Mother   . Heart disease Father   . Brain cancer Other   . Skin cancer Daughter   . Diabetes Sister   . Colon cancer Neg Hx     Social History Social History  Substance Use Topics  . Smoking status: Never Smoker  . Smokeless tobacco: Never Used  . Alcohol use No    Review of Systems Constitutional: No fever/chills Eyes: No visual changes. ENT: No sore throat. No stiff neck no neck pain Cardiovascular: Denies chest pain. Respiratory: Denies shortness of breath. Gastrointestinal:   no vomiting.  No diarrhea.  No constipation. Genitourinary: Negative for dysuria. Musculoskeletal: Negative lower extremity swelling Skin: Negative for rash. Neurological: Negative for severe headaches, focal weakness or numbness. 10-point ROS otherwise negative.  ____________________________________________   PHYSICAL EXAM:  VITAL SIGNS: ED Triage Vitals  Enc Vitals  Group     BP 07/05/16 1525 120/71     Pulse Rate 07/05/16 1525 71     Resp 07/05/16 1525 18     Temp --      Temp src --      SpO2 07/05/16 1525 95 %     Weight 07/05/16 1525 200 lb (90.7 kg)     Height 07/05/16 1525 5\' 9"  (1.753 m)     Head Circumference --      Peak Flow --      Pain Score 07/05/16 1430 10     Pain Loc --      Pain Edu? --      Excl. in [place]? --     Constitutional: Alert and oriented. Well appearing and in no acute distress. Eyes: Conjunctivae are normal. PERRL. EOMI. Head: Atraumatic. Nose: No congestion/rhinnorhea. Mouth/Throat: Mucous membranes are moist.  Oropharynx non-erythematous. Neck: No stridor.   Nontender with no meningismus Cardiovascular: Normal  rate, regular rhythm. Grossly normal heart sounds.  Good peripheral circulation. Respiratory: Normal respiratory effort.  No retractions. Lungs CTAB. Abdominal: Soft and minimally tender to palpation in the epigastric region ntender. No distention. No guarding no rebound Back:  There is no focal tenderness or step off.  there is no midline tenderness there are no lesions noted. there is no CVA tenderness Musculoskeletal: No lower extremity tenderness, no upper extremity tenderness. No joint effusions, no DVT signs strong distal pulses no edema Neurologic:  Normal speech and language. No gross focal neurologic deficits are appreciated.  Skin:  Skin is warm, dry and intact. No rash noted. Psychiatric: Mood and affect are normal. Speech and behavior are normal.  ____________________________________________   LABS (all labs ordered are listed, but only abnormal results are displayed)  Labs Reviewed  COMPREHENSIVE METABOLIC PANEL - Abnormal; Notable for the following:       Result Value   Glucose, Bld 156 (*)    Creatinine, Ser 1.06 (*)    GFR calc non Af Amer 49 (*)    GFR calc Af Amer 57 (*)    All other components within normal limits  URINALYSIS, COMPLETE (UACMP) WITH MICROSCOPIC - Abnormal; Notable  for the following:    Color, Urine YELLOW (*)    APPearance CLEAR (*)    Hgb urine dipstick SMALL (*)    Protein, ur 30 (*)    Squamous Epithelial / LPF 0-5 (*)    All other components within normal limits  GLUCOSE, CAPILLARY - Abnormal; Notable for the following:    Glucose-Capillary 145 (*)    All other components within normal limits  GLUCOSE, CAPILLARY - Abnormal; Notable for the following:    Glucose-Capillary 146 (*)    All other components within normal limits  LIPASE, BLOOD  CBC  TROPONIN I   ____________________________________________  EKG  I personally interpreted any EKGs ordered by me or triage Sinus rate 83 bpm no acute ST elevation or acute ST depression normal axis ____________________________________________  RADIOLOGY  I reviewed any imaging ordered by me or triage that were performed during my shift and, if possible, patient and/or family made aware of any abnormal findings. ____________________________________________   PROCEDURES  Procedure(s) performed: None  Procedures  Critical Care performed: None  ____________________________________________   INITIAL IMPRESSION / ASSESSMENT AND PLAN / ED COURSE  Pertinent labs & imaging results that were available during my care of the patient were reviewed by me and considered in my medical decision making (see chart for details).  His with nausea vomiting diarrhea. To me, her abdomen is benign with minimal epigastric discomfort CONSISTENT with a gastroenteritis. Patient strong preference is to go home. Even though I'm quite reassured by her exam, given her age I did do a CT scan which is also negative. No evidence of obstruction or ischemic gut or any acute bacteriologic pathology. Nor is there any evidence of acute coronary process or intrathoracic process was possible for her bloating sensation diarrhea and vomiting. Patient is tolerating by mouth with no difficulty and she is eager to go home her abdomen  is benign return precautions and follow-up given and understood.  Clinical Course    ____________________________________________   FINAL CLINICAL IMPRESSION(S) / ED DIAGNOSES  Final diagnoses:  None      This chart was dictated using voice recognition software.  Despite best efforts to proofread,  errors can occur which can change meaning.      Schuyler Amor, MD 07/05/16 [phone]  Schuyler Amor, MD 07/05/16 [phone]

## 2016-07-05 NOTE — ED Triage Notes (Signed)
Patient states that she woke up this morning with N/V/D and abdominal pain. Patient states that the pain is in her epigastric region and radiates around her left side into her back. Patient has vomited x 1 and had 1 episode of diarrhea. Patient denies blood in stool or vomit.

## 2016-07-05 NOTE — ED Notes (Signed)
Pt given warm blanket.

## 2016-07-08 ENCOUNTER — Ambulatory Visit (INDEPENDENT_AMBULATORY_CARE_PROVIDER_SITE_OTHER): Payer: PPO | Admitting: Family Medicine

## 2016-07-08 ENCOUNTER — Encounter: Payer: Self-pay | Admitting: Family Medicine

## 2016-07-08 ENCOUNTER — Telehealth: Payer: Self-pay | Admitting: Family Medicine

## 2016-07-08 VITALS — BP 136/76 | HR 66 | Temp 97.7°F | Ht 68.5 in | Wt 193.0 lb

## 2016-07-08 DIAGNOSIS — A084 Viral intestinal infection, unspecified: Secondary | ICD-10-CM | POA: Insufficient documentation

## 2016-07-08 MED ORDER — DIPHENOXYLATE-ATROPINE 2.5-0.025 MG/5ML PO LIQD: 5.0000 mL | Freq: Four times a day (QID) | ORAL | 0 refills | Status: DC | PRN

## 2016-07-08 NOTE — Patient Instructions (Addendum)
Now that you are not vomiting- focus first on fluid rehydration  Water/gatorade/ pedialyte/popcicles/jello Small sips at first then more as tolerated  If you get more dehydrated - go to the ED- signs of this include dry mouth and dry skin and stopping urination and dizziness  For food - BRAT diet - bananas, rice, apple sauce, toast (or crackers)- small amounts and advance as tolerated   If no improvement in several days please let me know   Rest as much as you can  If you develop worse abdominal pain or fever or vomiting- let me know and get to the ER if needed   Labs today for electrolytes   It's ok to to hold diabetes medication until you are able to eat

## 2016-07-08 NOTE — Assessment & Plan Note (Signed)
Reviewed ED notes and labs and CT from 12/30 with pt  Reassuring overall  Also re assuring that vomiting is over  I do worry about lytes with all the diarrhea  Check bmp today  Px lomotil for help with diarrhea  If no imp-consider c diff testing (she was on keflex lately)- however I suspect viral etiology given her husband also had it  Mild dehydration likely- long disc re: rehydration at home to prevent going to the ED again Handouts give Update if not starting to improve in several days or if worsening

## 2016-07-08 NOTE — Telephone Encounter (Signed)
[place] [facility] Patient Name: Margaret Vazquez DOB: 1936/02/19 Initial Comment Caller states she was at ER for stomach pain, been up all night, hasn't been able to keep anything down and goes right through her. She is having really bad diarrhea, and vomiting. Nurse Assessment Nurse: Germain Osgood, RN, Opal Sidles Date/Time Margaret Vazquez Time): 07/08/2016 10:21:19 AM Confirm and document reason for call. If symptomatic, describe symptoms. ---Caller states she was at ER for stomach pain, been up all night, hasn't been able to keep anything down and goes right through her. She is having really bad diarrhea, and vomiting. Awakened Saturday Morning with abdominal pain. Stomach was hard and was burping. Discharged Home Saturday night. Has had diarrhea since Saturday. Had been on abx X 1 week for skin infection at skin cancer removal sites Cephalexin 500 mg po BID. Spouse has diarrhea illness. Took imodium yesterday. No fever Does the patient have any new or worsening symptoms? ---Yes Will a triage be completed? ---Yes Related visit to physician within the last 2 weeks? ---Yes Does the PT have any chronic conditions? (i.e. diabetes, asthma, etc.) ---Yes List chronic conditions. ---Diabetes, high Blood Pressure Blood thinner ( 3 strokes ) Is this a behavioral health or substance abuse call? ---No Guidelines Guideline Title Affirmed Question Affirmed Notes Diarrhea [1] SEVERE diarrhea (e.g., 7 or more times / day more than normal) AND [2] age > 60 years Final Disposition User See Physician within 4 Hours (or PCP triage) Germain Osgood, RN, Jane Comments blood sugar yesterday 113. Called back line and verified no open times to schedule patient. Was advised Dr. Glori Bickers will see at 4:15 pm today. Caller agreed to arrival time Referrals REFERRED TO PCP OFFICE Disagree/Comply: Comply

## 2016-07-08 NOTE — Progress Notes (Signed)
Pre visit review using our clinic review tool, if applicable. No additional management support is needed unless otherwise documented below in the visit note. 

## 2016-07-08 NOTE — Telephone Encounter (Signed)
I will see her then  

## 2016-07-08 NOTE — Progress Notes (Signed)
Subjective:    Patient ID: Margaret Vazquez, female    DOB: 05/15/1936, 81 y.o.   MRN: [id number]  HPI Here for f/u of gastroenteritis  She was seen in the ED 07/05/16 for n/v/d that started abruptly   She was tx with zofran    CT was done Ct Abdomen Pelvis W Contrast  Result Date: 07/05/2016 CLINICAL DATA:  Epigastric pain radiating to the left side. Nausea and vomiting. EXAM: CT ABDOMEN AND PELVIS WITH CONTRAST TECHNIQUE: Multidetector CT imaging of the abdomen and pelvis was performed using the standard protocol following bolus administration of intravenous contrast. CONTRAST:  16m ISOVUE-300 IOPAMIDOL (ISOVUE-300) INJECTION 61% COMPARISON:  06/23/2014 FINDINGS: Lower chest: Mild chronic scarring at the lung bases. No pleural or pericardial fluid. Hepatobiliary: No liver parenchymal lesions seen. No calcified gallstones. Pancreas: Normal. Small duodenal diverticulum in the region the pancreatic head. Spleen: Normal Adrenals/Urinary Tract: Adrenal glands are normal. Kidneys are normal. No cyst, mass, stone or hydronephrosis. Stomach/Bowel: Normal.  No acute or significant finding. Vascular/Lymphatic: Aortic atherosclerosis. No aneurysm. IVC is normal. No retroperitoneal adenopathy. Reproductive: Normal Other: No free fluid or air. Musculoskeletal: Ordinary lower lumbar degenerative changes. IMPRESSION: No acute or significant finding by CT.  No cause of pain identified. Aortic atherosclerosis. Electronically Signed   By: MNelson ChimesM.D.   On: 07/05/2016 20:58     This was re assuring   Labs done  Results for orders placed or performed during the hospital encounter of 07/05/16  Lipase, blood  Result Value Ref Range   Lipase 17 11 - 51 U/L  Comprehensive metabolic panel  Result Value Ref Range   Sodium 138 135 - 145 mmol/L   Potassium 4.2 3.5 - 5.1 mmol/L   Chloride 103 101 - 111 mmol/L   CO2 26 22 - 32 mmol/L   Glucose, Bld 156 (H) 65 - 99 mg/dL   BUN 18 6 - 20 mg/dL   Creatinine, Ser 1.06 (H) 0.44 - 1.00 mg/dL   Calcium 9.1 8.9 - 10.3 mg/dL   Total Protein 7.8 6.5 - 8.1 g/dL   Albumin 4.0 3.5 - 5.0 g/dL   AST 21 15 - 41 U/L   ALT 16 14 - 54 U/L   Alkaline Phosphatase 85 38 - 126 U/L   Total Bilirubin 0.6 0.3 - 1.2 mg/dL   GFR calc non Af Amer 49 (L) >60 mL/min   GFR calc Af Amer 57 (L) >60 mL/min   Anion gap 9 5 - 15  CBC  Result Value Ref Range   WBC 10.5 3.6 - 11.0 K/uL   RBC 4.64 3.80 - 5.20 MIL/uL   Hemoglobin 15.2 12.0 - 16.0 g/dL   HCT 44.2 35.0 - 47.0 %   MCV 95.3 80.0 - 100.0 fL   MCH 32.8 26.0 - 34.0 pg   MCHC 34.4 32.0 - 36.0 g/dL   RDW 13.3 11.5 - 14.5 %   Platelets 249 150 - 440 K/uL  Urinalysis, Complete w Microscopic  Result Value Ref Range   Color, Urine YELLOW (A) YELLOW   APPearance CLEAR (A) CLEAR   Specific Gravity, Urine 1.025 1.005 - 1.030   pH 5.0 5.0 - 8.0   Glucose, UA NEGATIVE NEGATIVE mg/dL   Hgb urine dipstick SMALL (A) NEGATIVE   Bilirubin Urine NEGATIVE NEGATIVE   Ketones, ur NEGATIVE NEGATIVE mg/dL   Protein, ur 30 (A) NEGATIVE mg/dL   Nitrite NEGATIVE NEGATIVE   Leukocytes, UA NEGATIVE NEGATIVE   RBC / HPF 0-5  0 - 5 RBC/hpf   WBC, UA 0-5 0 - 5 WBC/hpf   Bacteria, UA NONE SEEN NONE SEEN   Squamous Epithelial / LPF 0-5 (A) NONE SEEN   Mucous PRESENT   Troponin I  Result Value Ref Range   Troponin I <0.03 <0.03 ng/mL  Glucose, capillary  Result Value Ref Range   Glucose-Capillary 145 (H) 65 - 99 mg/dL  Glucose, capillary  Result Value Ref Range   Glucose-Capillary 146 (H) 65 - 99 mg/dL    Had been on abx for 1 2week for a skin infection (at skin cancer removal sites)   Cephalexin 500 mg bid    Spouse had diarrhea   Wt Readings from Last 3 Encounters:  07/08/16 193 lb (87.5 kg)  07/05/16 200 lb (90.7 kg)  05/14/16 203 lb 3.2 oz (92.2 kg)   BP Readings from Last 3 Encounters:  07/08/16 136/76  07/05/16 (!) 131/58  05/14/16 120/66   Pulse Readings from Last 3 Encounters:  07/08/16 66    07/05/16 66  05/14/16 73   Temp: 97.7 F (36.5 C)   Has not vomited since Saturday  Never got the zofran refilled - and her vomiting got better on its own  Is drinking some water (and gatorade) Able to eat a few cracker Fluids "go right through her" Not urinating a lot  Just a little dizzy when she gets up  Lying in bed   Cannot control the diarrhea- is "yellow water"  Patient Active Problem List   Diagnosis Date Noted  . Viral gastroenteritis 07/08/2016  . Paroxysmal atrial fibrillation ([place]) 04/07/2016  . Fall 08/13/2015  . Chest wall pain 08/13/2015  . Left ankle pain 08/13/2015  . Left knee pain 06/14/2015  . Chest discomfort 11/22/2014  . Overweight (BMI 25.0-29.9) 08/23/2014  . Type 2 diabetes mellitus with vascular disease ([place]) 01/12/2014  . TIA (transient ischemic attack) 01/11/2014  . CVA (cerebral infarction) 01/11/2014  . Sensory disturbance 01/11/2014  . Pain of right thumb 11/14/2013  . Left hip pain 11/14/2013  . Chest pain at rest 08/31/2013  . LOOP Recorder LINQ 06/07/2013  . Fatigue 06/07/2013  . Sinusitis, chronic 05/16/2013  . Acute ischemic stroke ([place]) 02/15/2013  . Numbness and tingling of right arm 02/14/2013  . Encounter for Medicare annual wellness exam 10/27/2012  . Encopresis(307.7) 12/15/2011  . Post-menopausal 10/28/2011  . PERSONAL HX COLONIC POLYPS 09/04/2009  . B12 deficiency 03/06/2009  . UNSPECIFIED ANEMIA 02/13/2009  . ALLERGIC RHINITIS 02/14/2008  . BACK PAIN, LUMBAR 11/03/2007  . Hyperlipidemia 11/24/2006  . Essential hypertension 11/24/2006  . Diabetes type 2, controlled ([place]) 10/09/2006  . GERD 10/09/2006  . OVERACTIVE BLADDER 10/09/2006  . INCONTINENCE, URGE 10/09/2006  . SKIN CANCER, HX OF 10/09/2006   Past Medical History:  Diagnosis Date  . Allergy history, drug    Aspirin  . CAD (coronary artery disease) 04/08/11   non obst by cath  . Cervical stenosis of spine    With neck pain  . Colon polyps 2009  .  Diabetes mellitus    type II  . GERD (gastroesophageal reflux disease)   . Hyperlipidemia    myalgias with Lipitor and Zetia  . Hypertension   . Migraine   . Skin cancer    hx of basal cell/ak's  . Stroke [facility]) August 2014  . TIA (transient ischemic attack)    Past Surgical History:  Procedure Laterality Date  . ABD [place]  07/2003   Negative  . APPENDECTOMY    .  BREAST SURGERY  1992   breast biopsy  . CARDIAC CATHETERIZATION  04/07/2011   non obst CAD (Dr Burt Knack)  . COLONOSCOPY  12/2007   Adenomatous colon polyps  . CYSTOSCOPY W/ DECANNULATION  03/2000   Normal  . EYE SURGERY  2005   tear duct surgery  . LOOP RECORDER IMPLANT N/A 02/16/2013   Procedure: LOOP RECORDER IMPLANT;  Surgeon: Deboraha Sprang, MD;  Location: [facility] CATH LAB;  Service: Cardiovascular;  Laterality: N/A;  . NASAL SINUS SURGERY  01/2005  . STRESS CARDIOLITE  11/1999   Normal/ negative  . [place] DUCT SURGERY  2005  . TEE WITHOUT CARDIOVERSION N/A 02/16/2013   Procedure: TRANSESOPHAGEAL ECHOCARDIOGRAM (TEE);  Surgeon: Josue Hector, MD;  Location: [facility] ENDOSCOPY;  Service: Cardiovascular;  Laterality: N/A;  . TUBAL LIGATION     BTL   Social History  Substance Use Topics  . Smoking status: Never Smoker  . Smokeless tobacco: Never Used  . Alcohol use No   Family History  Problem Relation Age of Onset  . Lung cancer Brother   . Diabetes Brother   . Pancreatic cancer Brother   . Heart disease Mother   . Heart disease Father   . Brain cancer Other   . Skin cancer Daughter   . Diabetes Sister   . Colon cancer Neg Hx    Allergies  Allergen Reactions  . Bee Venom Hives, Shortness Of Breath and Swelling  . Nabumetone Anaphylaxis  . Amoxicillin-Pot Clavulanate Hives and Swelling    To lips.  . Aspirin Hives  . Atorvastatin Swelling     joint pain/swelling, inc liver tests  . Clopidogrel Bisulfate Hives  . Codeine Nausea And Vomiting  . Ezetimibe Other (See Comments)     fatigue  . Metformin And Related  Other (See Comments)    Diarrhea   . Valsartan Other (See Comments)     fatigue  . Other Hives    **Red Meat**   Current Outpatient Prescriptions on File Prior to Visit  Medication Sig Dispense Refill  . diphenhydrAMINE (BENADRYL) 25 MG tablet Take 50 mg by mouth every 8 (eight) hours as needed for allergies.     Marland Kitchen EPINEPHrine (EPI-PEN) 0.3 mg/0.3 mL DEVI Inject 0.3 mg into the muscle daily as needed (allergic reaction).     . fluticasone (FLONASE) 50 MCG/ACT nasal spray Place 2 sprays into both nostrils 2 (two) times daily as needed. Reported on 09/10/2015    . glipiZIDE (GLUCOTROL XL) 10 MG 24 hr tablet Take 1 tablet (10 mg total) by mouth daily with breakfast. 30 tablet 11  . glucose blood (ONE TOUCH ULTRA TEST) test strip CHECK BLOOD SUGAR ONCE DAILY AND AS NEEDED (DX. E11.9) 100 each 1  . losartan-hydrochlorothiazide (HYZAAR) 100-25 MG tablet Take 1 tablet by mouth daily. 30 tablet 11  . potassium chloride SA (K-DUR,KLOR-CON) 20 MEQ tablet Take 1 tablet (20 mEq total) by mouth daily. 90 tablet 1  . ticlopidine (TICLID) 250 MG tablet Take 1 tablet (250 mg total) by mouth 2 (two) times daily with a meal. 180 tablet 0  . vitamin B-12 (CYANOCOBALAMIN) 1000 MCG tablet Take 1,000 mcg by mouth daily.    . ondansetron (ZOFRAN) 4 MG tablet Take 1 tablet (4 mg total) by mouth every 8 (eight) hours as needed for nausea or vomiting. (Patient not taking: Reported on 07/08/2016) 8 tablet 0  . [DISCONTINUED] levETIRAcetam (KEPPRA) 250 MG tablet Take 1 tablet (250 mg total) by mouth 2 (two) times  daily. 60 tablet 0   No current facility-administered medications on file prior to visit.      Review of Systems Review of Systems  Constitutional: Negative for fever, fatigue and unexpected weight change. pos for general malaise: Opthy:  Negative for pain and visual disturbance.  Respiratory: Negative for cough and shortness of breath.   Cardiovascular: Negative for cp or palpitations    Gastrointestinal:  pos for nausea, diarrhea and neg for blood in stool, black stool Genitourinary: Negative for urgency and frequency.  Skin: Negative for pallor or rash   Neurological: Negative for weakness, light-headedness, numbness and headaches. (pos for light headedness when she stands from sitting Hematological: Negative for adenopathy. Does not bruise/bleed easily.  Psychiatric/Behavioral: Negative for dysphoric mood. The patient is not nervous/anxious.         Objective:   Physical Exam  Constitutional: She appears well-developed and well-nourished. No distress.  Fatigued appearing   HENT:  Head: Normocephalic and atraumatic.  Mouth/Throat: Oropharynx is clear and moist.  MMM   Eyes: Conjunctivae and EOM are normal. Pupils are equal, round, and reactive to light. No scleral icterus.  Neck: Normal range of motion. Neck supple.  Cardiovascular: Normal rate, regular rhythm and normal heart sounds.   Pulmonary/Chest: Effort normal and breath sounds normal. No respiratory distress. She has no wheezes. She has no rales.  Abdominal: Soft. Bowel sounds are normal. She exhibits no distension and no mass. There is no hepatosplenomegaly. There is tenderness in the epigastric area. There is no rigidity, no rebound, no guarding, no CVA tenderness, no tenderness at McBurney's point and negative Murphy's sign.  Lymphadenopathy:    She has no cervical adenopathy.  Neurological: She is alert.  Skin: Skin is warm and dry. No erythema. No pallor.  Capillary refill less than 3 seconds No tenting  Nl turgor  Psychiatric: She has a normal mood and affect.          Assessment & Plan:   Problem List Items Addressed This Visit      Digestive   Viral gastroenteritis    Reviewed ED notes and labs and CT from 12/30 with pt  Reassuring overall  Also re assuring that vomiting is over  I do worry about lytes with all the diarrhea  Check bmp today  Px lomotil for help with diarrhea  If no imp-consider c diff  testing (she was on keflex lately)- however I suspect viral etiology given her husband also had it  Mild dehydration likely- long disc re: rehydration at home to prevent going to the ED again Handouts give Update if not starting to improve in several days or if worsening        Relevant Orders   Basic metabolic panel

## 2016-07-09 LAB — BASIC METABOLIC PANEL
BUN: 16 mg/dL (ref 6–23)
CO2: 27 mEq/L (ref 19–32)
Calcium: 9.2 mg/dL (ref 8.4–10.5)
Chloride: 104 mEq/L (ref 96–112)
Creatinine, Ser: 1.06 mg/dL (ref 0.40–1.20)
GFR: 53.18 mL/min — ABNORMAL LOW (ref >60.00)
Glucose, Bld: 106 mg/dL — ABNORMAL HIGH (ref 70–99)
Potassium: 4.1 mEq/L (ref 3.5–5.1)
Sodium: 138 mEq/L (ref 135–145)

## 2016-07-16 ENCOUNTER — Encounter: Payer: Self-pay | Admitting: Family Medicine

## 2016-07-16 ENCOUNTER — Ambulatory Visit (INDEPENDENT_AMBULATORY_CARE_PROVIDER_SITE_OTHER): Payer: PPO | Admitting: Family Medicine

## 2016-07-16 ENCOUNTER — Ambulatory Visit (INDEPENDENT_AMBULATORY_CARE_PROVIDER_SITE_OTHER): Payer: PPO | Admitting: *Deleted

## 2016-07-16 VITALS — BP 120/60 | HR 64 | Wt 201.0 lb

## 2016-07-16 DIAGNOSIS — N764 Abscess of vulva: Secondary | ICD-10-CM

## 2016-07-16 DIAGNOSIS — I639 Cerebral infarction, unspecified: Secondary | ICD-10-CM

## 2016-07-16 NOTE — Progress Notes (Signed)
Carelink Summary Report / Loop Recorder 

## 2016-07-16 NOTE — Progress Notes (Signed)
Subjective:    Patient ID: Margaret Vazquez, female    DOB: Jun 14, 1936, 81 y.o.   MRN: [id number]  HPI  This is a 81 yo female who presents today with a sore place on her upper inner left thigh, a little sore, feels like a knot. Has gotten a little bigger since she found it last week.  Recently had a stomach virus, is recovering. Has a history of skin cancer and is concerned about recurrence.   Past Medical History:  Diagnosis Date  . Allergy history, drug    Aspirin  . CAD (coronary artery disease) 04/08/11   non obst by cath  . Cervical stenosis of spine    With neck pain  . Colon polyps 2009  . Diabetes mellitus    type II  . GERD (gastroesophageal reflux disease)   . Hyperlipidemia    myalgias with Lipitor and Zetia  . Hypertension   . Migraine   . Skin cancer    hx of basal cell/ak's  . Stroke [facility]) August 2014  . TIA (transient ischemic attack)    Past Surgical History:  Procedure Laterality Date  . ABD [place]  07/2003   Negative  . APPENDECTOMY    . BREAST SURGERY  1992   breast biopsy  . CARDIAC CATHETERIZATION  04/07/2011   non obst CAD (Dr Burt Knack)  . COLONOSCOPY  12/2007   Adenomatous colon polyps  . CYSTOSCOPY W/ DECANNULATION  03/2000   Normal  . EYE SURGERY  2005   tear duct surgery  . LOOP RECORDER IMPLANT N/A 02/16/2013   Procedure: LOOP RECORDER IMPLANT;  Surgeon: Deboraha Sprang, MD;  Location: [facility] CATH LAB;  Service: Cardiovascular;  Laterality: N/A;  . NASAL SINUS SURGERY  01/2005  . STRESS CARDIOLITE  11/1999   Normal/ negative  . [place] DUCT SURGERY  2005  . TEE WITHOUT CARDIOVERSION N/A 02/16/2013   Procedure: TRANSESOPHAGEAL ECHOCARDIOGRAM (TEE);  Surgeon: Josue Hector, MD;  Location: [facility] ENDOSCOPY;  Service: Cardiovascular;  Laterality: N/A;  . TUBAL LIGATION     BTL   Family History  Problem Relation Age of Onset  . Lung cancer Brother   . Diabetes Brother   . Pancreatic cancer Brother   . Heart disease Mother   . Heart disease Father   .  Brain cancer Other   . Skin cancer Daughter   . Diabetes Sister   . Colon cancer Neg Hx    Social History  Substance Use Topics  . Smoking status: Never Smoker  . Smokeless tobacco: Never Used  . Alcohol use No     Review of Systems Per HPI    Objective:   Physical Exam  Constitutional: She is oriented to person, place, and time. She appears well-developed and well-nourished. No distress.  HENT:  Head: Normocephalic and atraumatic.  Cardiovascular: Normal rate.   Pulmonary/Chest: Effort normal.  Genitourinary:     Neurological: She is alert and oriented to person, place, and time.  Skin: Skin is warm and dry. She is not diaphoretic.  Psychiatric: She has a normal mood and affect. Her behavior is normal. Judgment and thought content normal.  Vitals reviewed.     BP 120/60   Pulse 64   Wt 201 lb (91.2 kg)   SpO2 97%   BMI 30.12 kg/m  Wt Readings from Last 3 Encounters:  07/16/16 201 lb (91.2 kg)  07/08/16 193 lb (87.5 kg)  07/05/16 200 lb (90.7 kg)  Assessment & Plan:  1. Boil of vulva - discussed diagnosis with patient - will treat with warm compresses, clean regularly with soap and water - monitor for worsening symptoms, may need I&D/ antibiotic if no improvement with conservative treatment  Clarene Reamer, FNP-BC  [facility]  07/16/2016 3:00 PM

## 2016-07-16 NOTE — Patient Instructions (Signed)
Take warm bath and use compresses  Keep it clean and dry If not better in 2 weeks or if it gets bigger it may need to be drained  Skin Abscess A skin abscess is an infected area on or under your skin that contains pus and other material. An abscess can happen almost anywhere on your body. Some abscesses break open (rupture) on their own. Most continue to get worse unless they are treated. The infection can spread deeper into the body and into your blood, which can make you feel sick. Treatment usually involves draining the abscess. Follow these instructions at home: Abscess Care  If you have an abscess that has not drained, place a warm, clean, wet washcloth over the abscess several times a day. Do this as told by your doctor.  Follow instructions from your doctor about how to take care of your abscess. Make sure you:  Cover the abscess with a bandage (dressing).  Change your bandage or gauze as told by your doctor.  Wash your hands with soap and water before you change the bandage or gauze. If you cannot use soap and water, use hand sanitizer.  Check your abscess every day for signs that the infection is getting worse. Check for:  More redness, swelling, or pain.  More fluid or blood.  Warmth.  More pus or a bad smell. Medicines  Take over-the-counter and prescription medicines only as told by your doctor.  If you were prescribed an antibiotic medicine, take it as told by your doctor. Do not stop taking the antibiotic even if you start to feel better. General instructions  To avoid spreading the infection:  Do not share personal care items, towels, or hot tubs with others.  Avoid making skin-to-skin contact with other people.  Keep all follow-up visits as told by your doctor. This is important. Contact a doctor if:  You have more redness, swelling, or pain around your abscess.  You have more fluid or blood coming from your abscess.  Your abscess feels warm when you  touch it.  You have more pus or a bad smell coming from your abscess.  You have a fever.  Your muscles ache.  You have chills.  You feel sick. Get help right away if:  You have very bad (severe) pain.  You see red streaks on your skin spreading away from the abscess. This information is not intended to replace advice given to you by your health care provider. Make sure you discuss any questions you have with your health care provider. Document Released: 12/10/2007 Document Revised: 02/17/2016 Document Reviewed: 05/02/2015 Elsevier Interactive Patient Education  2017 [employer].

## 2016-07-18 ENCOUNTER — Telehealth: Payer: Self-pay | Admitting: Cardiology

## 2016-07-18 NOTE — Telephone Encounter (Signed)
LMOVM requesting that pt send manual transmission b/c home monitor has not updated in at least 14 days.    

## 2016-07-30 ENCOUNTER — Other Ambulatory Visit: Payer: Self-pay | Admitting: Internal Medicine

## 2016-07-31 ENCOUNTER — Encounter: Payer: Self-pay | Admitting: Cardiology

## 2016-08-05 LAB — CUP PACEART REMOTE DEVICE CHECK
Date Time Interrogation Session: 20171211145153
Implantable Pulse Generator Implant Date: 20140813

## 2016-08-05 NOTE — Progress Notes (Signed)
Carelink summary report received. Battery status OK. Normal device function. No new symptom episodes, tachy episodes, brady, or pause episodes. No new AF episodes. Monthly summary reports and ROV/PRN 

## 2016-08-06 ENCOUNTER — Other Ambulatory Visit: Payer: Self-pay

## 2016-08-06 DIAGNOSIS — X32XXXA Exposure to sunlight, initial encounter: Secondary | ICD-10-CM | POA: Diagnosis not present

## 2016-08-06 DIAGNOSIS — L570 Actinic keratosis: Secondary | ICD-10-CM | POA: Diagnosis not present

## 2016-08-06 MED ORDER — TICLOPIDINE HCL 250 MG PO TABS: 250.0000 mg | ORAL_TABLET | Freq: Two times a day (BID) | ORAL | 0 refills | Status: DC

## 2016-08-11 ENCOUNTER — Telehealth: Payer: Self-pay | Admitting: Family Medicine

## 2016-08-11 DIAGNOSIS — J069 Acute upper respiratory infection, unspecified: Secondary | ICD-10-CM | POA: Diagnosis not present

## 2016-08-11 DIAGNOSIS — R05 Cough: Secondary | ICD-10-CM | POA: Diagnosis not present

## 2016-08-11 NOTE — Telephone Encounter (Signed)
Left voicemail requesting pt to call office back too

## 2016-08-11 NOTE — Telephone Encounter (Signed)
Please keep trying to reach her to check in  Thanks

## 2016-08-11 NOTE — Telephone Encounter (Signed)
[facility]  Patient Name: Margaret Vazquez  DOB: September 01, 1935    Initial Comment Caller has dry cough, fever of 101, feeling worse and worse, has chills, is coughing up stuff.    Nurse Assessment  Nurse: Harlow Mares, RN, Suanne Marker Date/Time (Eastern Time): 08/11/2016 3:30:20 PM  Confirm and document reason for call. If symptomatic, describe symptoms. ---Caller has dry cough, fever of 101, feeling worse and worse, has chills, is coughing up stuff. Symptoms began yesterday. Cough is brownish color.  Does the patient have any new or worsening symptoms? ---Yes  Will a triage be completed? ---Yes  Related visit to physician within the last 2 weeks? ---No  Does the PT have any chronic conditions? (i.e. diabetes, asthma, etc.) ---Yes  List chronic conditions. ---diabetes; HTN; hx stroke  Is this a behavioral health or substance abuse call? ---No     Guidelines    Guideline Title Affirmed Question Affirmed Notes       Final Disposition User

## 2016-08-11 NOTE — Telephone Encounter (Signed)
Unable to reach pt by phone x 2; left v/m requesting cb.

## 2016-08-12 NOTE — Telephone Encounter (Signed)
Pt said she did start taking the mucinex but she didn't get her other medications. Pt said she will call around to other pharmacies and if she finds one close to here that has the tamiflu she will call us back

## 2016-08-12 NOTE — Telephone Encounter (Signed)
Margaret Vazquez returned Shapale's call.  Margaret Vazquez went to [facility].  The doctor told her she couldn't test for the flu, but diagnosed her with the flu.  Margaret Vazquez went to [facility] to fill the prescriptions and the pharmacy told her they were out of the medications.  Margaret Vazquez said her fever was 101.8 all night and this morning it was 99. Margaret Vazquez has been taking Tylenol for the fever. They prescribed Tamiflu , Tesselon Pearls,and Guaifenesin. I let Margaret Vazquez know that the Guaifenesin would be the same as Mucinex

## 2016-08-12 NOTE — Telephone Encounter (Signed)
guaifenesin is the same as mucinex  Did she end up getting her other medications?  If not- she may need to try another pharmacy

## 2016-08-15 ENCOUNTER — Ambulatory Visit (INDEPENDENT_AMBULATORY_CARE_PROVIDER_SITE_OTHER): Payer: PPO | Admitting: *Deleted

## 2016-08-15 DIAGNOSIS — I639 Cerebral infarction, unspecified: Secondary | ICD-10-CM | POA: Diagnosis not present

## 2016-08-15 NOTE — Progress Notes (Signed)
Carelink Summary Report / Loop Recorder 

## 2016-08-20 ENCOUNTER — Encounter: Payer: Self-pay | Admitting: Internal Medicine

## 2016-08-24 ENCOUNTER — Telehealth: Payer: Self-pay | Admitting: Family Medicine

## 2016-08-24 DIAGNOSIS — E1159 Type 2 diabetes mellitus with other circulatory complications: Secondary | ICD-10-CM

## 2016-08-24 DIAGNOSIS — E538 Deficiency of other specified B group vitamins: Secondary | ICD-10-CM

## 2016-08-24 DIAGNOSIS — Z0000 Encounter for general adult medical examination without abnormal findings: Secondary | ICD-10-CM | POA: Insufficient documentation

## 2016-08-24 NOTE — Telephone Encounter (Signed)
-----   Message from Ellamae Sia sent at 08/19/2016  5:11 PM EST ----- Regarding: Lab orders for Wednesday, 2.21.18 Patient is scheduled for CPX labs, please order future labs, Thanks , Karna Christmas

## 2016-08-27 ENCOUNTER — Other Ambulatory Visit: Payer: PPO

## 2016-08-27 ENCOUNTER — Ambulatory Visit (INDEPENDENT_AMBULATORY_CARE_PROVIDER_SITE_OTHER): Payer: PPO

## 2016-08-27 VITALS — BP 118/80 | HR 56 | Temp 98.0°F | Ht 68.0 in | Wt 195.5 lb

## 2016-08-27 DIAGNOSIS — E1159 Type 2 diabetes mellitus with other circulatory complications: Secondary | ICD-10-CM | POA: Diagnosis not present

## 2016-08-27 DIAGNOSIS — Z0000 Encounter for general adult medical examination without abnormal findings: Secondary | ICD-10-CM

## 2016-08-27 DIAGNOSIS — E538 Deficiency of other specified B group vitamins: Secondary | ICD-10-CM | POA: Diagnosis not present

## 2016-08-27 LAB — COMPREHENSIVE METABOLIC PANEL
ALT: 18 U/L (ref 0–35)
AST: 16 U/L (ref 0–37)
Albumin: 3.8 g/dL (ref 3.5–5.2)
Alkaline Phosphatase: 88 U/L (ref 39–117)
BUN: 16 mg/dL (ref 6–23)
CO2: 31 mEq/L (ref 19–32)
Calcium: 9.1 mg/dL (ref 8.4–10.5)
Chloride: 105 mEq/L (ref 96–112)
Creatinine, Ser: 1.04 mg/dL (ref 0.40–1.20)
GFR: 54.34 mL/min — ABNORMAL LOW (ref >60.00)
Glucose, Bld: 104 mg/dL — ABNORMAL HIGH (ref 70–99)
Potassium: 3.9 mEq/L (ref 3.5–5.1)
Sodium: 140 mEq/L (ref 135–145)
Total Bilirubin: 0.4 mg/dL (ref 0.2–1.2)
Total Protein: 6.8 g/dL (ref 6.0–8.3)

## 2016-08-27 LAB — HEMOGLOBIN A1C: Hgb A1c MFr Bld: 6.9 % — ABNORMAL HIGH (ref 4.6–6.5)

## 2016-08-27 LAB — LIPID PANEL
Cholesterol: 197 mg/dL (ref 0–200)
HDL: 40.5 mg/dL (ref >39.00)
NonHDL: 156.91
Total CHOL/HDL Ratio: 5
Triglycerides: 215 mg/dL — ABNORMAL HIGH (ref 0.0–149.0)
VLDL: 43 mg/dL — ABNORMAL HIGH (ref 0.0–40.0)

## 2016-08-27 LAB — CBC WITH DIFFERENTIAL/PLATELET
Basophils Absolute: 0.1 10*3/uL (ref 0.0–0.1)
Basophils Relative: 1.5 % (ref 0.0–3.0)
Eosinophils Absolute: 0.1 10*3/uL (ref 0.0–0.7)
Eosinophils Relative: 1.9 % (ref 0.0–5.0)
HCT: 39.5 % (ref 36.0–46.0)
Hemoglobin: 13.3 g/dL (ref 12.0–15.0)
Lymphocytes Relative: 44 % (ref 12.0–46.0)
Lymphs Abs: 2.6 10*3/uL (ref 0.7–4.0)
MCHC: 33.6 g/dL (ref 30.0–36.0)
MCV: 95.6 fl (ref 78.0–100.0)
Monocytes Absolute: 0.5 10*3/uL (ref 0.1–1.0)
Monocytes Relative: 8.4 % (ref 3.0–12.0)
Neutro Abs: 2.6 10*3/uL (ref 1.4–7.7)
Neutrophils Relative %: 44.2 % (ref 43.0–77.0)
Platelets: 222 10*3/uL (ref 150.0–400.0)
RBC: 4.12 Mil/uL (ref 3.87–5.11)
RDW: 13 % (ref 11.5–15.5)
WBC: 5.9 10*3/uL (ref 4.0–10.5)

## 2016-08-27 LAB — CUP PACEART REMOTE DEVICE CHECK
Date Time Interrogation Session: 20180110133814
Implantable Pulse Generator Implant Date: 20140813

## 2016-08-27 LAB — VITAMIN B12: Vitamin B-12: 664 pg/mL (ref 211–911)

## 2016-08-27 LAB — TSH: TSH: 4.14 u[IU]/mL (ref 0.35–4.50)

## 2016-08-27 LAB — LDL CHOLESTEROL, DIRECT: Direct LDL: 107 mg/dL

## 2016-08-27 NOTE — Progress Notes (Signed)
Pre visit review using our clinic review tool, if applicable. No additional management support is needed unless otherwise documented below in the visit note. 

## 2016-08-27 NOTE — Progress Notes (Signed)
Subjective:   Margaret Vazquez is a 81 y.o. female who presents for Medicare Annual (Subsequent) preventive examination.  Review of Systems:  N/A Cardiac Risk Factors include: advanced age (>10men, >37 women);obesity (BMI >30kg/m2);diabetes mellitus;dyslipidemia;hypertension     Objective:     Vitals: BP 118/80 (BP Location: Left Arm, Patient Position: Sitting, Cuff Size: Normal)   Pulse (!) 56   Temp 98 F (36.7 C) (Oral)   Ht 5\' 8"  (1.727 m) Comment: no shoes  Wt 195 lb 8 oz (88.7 kg)   SpO2 95%   BMI 29.73 kg/m   Body mass index is 29.73 kg/m.   Tobacco History  Smoking Status  . Never Smoker  Smokeless Tobacco  . Never Used     Counseling given: No   Past Medical History:  Diagnosis Date  . Allergy history, drug    Aspirin  . CAD (coronary artery disease) 04/08/11   non obst by cath  . Cervical stenosis of spine    With neck pain  . Colon polyps 2009  . Diabetes mellitus    type II  . GERD (gastroesophageal reflux disease)   . Hyperlipidemia    myalgias with Lipitor and Zetia  . Hypertension   . Migraine   . Skin cancer    hx of basal cell/ak's  . Stroke [facility]) August 2014  . TIA (transient ischemic attack)    Past Surgical History:  Procedure Laterality Date  . ABD [place]  07/2003   Negative  . APPENDECTOMY    . BREAST SURGERY  1992   breast biopsy  . CARDIAC CATHETERIZATION  04/07/2011   non obst CAD (Dr Burt Knack)  . COLONOSCOPY  12/2007   Adenomatous colon polyps  . CYSTOSCOPY W/ DECANNULATION  03/2000   Normal  . EYE SURGERY  2005   tear duct surgery  . LOOP RECORDER IMPLANT N/A 02/16/2013   Procedure: LOOP RECORDER IMPLANT;  Surgeon: Deboraha Sprang, MD;  Location: [facility] CATH LAB;  Service: Cardiovascular;  Laterality: N/A;  . NASAL SINUS SURGERY  01/2005  . STRESS CARDIOLITE  11/1999   Normal/ negative  . [place] DUCT SURGERY  2005  . TEE WITHOUT CARDIOVERSION N/A 02/16/2013   Procedure: TRANSESOPHAGEAL ECHOCARDIOGRAM (TEE);  Surgeon: Josue Hector, MD;  Location: [facility] ENDOSCOPY;  Service: Cardiovascular;  Laterality: N/A;  . TUBAL LIGATION     BTL   Family History  Problem Relation Age of Onset  . Lung cancer Brother   . Diabetes Brother   . Pancreatic cancer Brother   . Heart disease Mother   . Heart disease Father   . Brain cancer Other   . Skin cancer Daughter   . Diabetes Sister   . Colon cancer Neg Hx    History  Sexual Activity  . Sexual activity: No    Outpatient Encounter Prescriptions as of 08/27/2016  Medication Sig  . diphenhydrAMINE (BENADRYL) 25 MG tablet Take 50 mg by mouth every 8 (eight) hours as needed for allergies.   . diphenoxylate-atropine (LOMOTIL) 2.5-0.025 MG/5ML liquid Take 5 mLs by mouth 4 (four) times daily as needed for diarrhea or loose stools. Caution of sedation  . EPINEPHrine (EPI-PEN) 0.3 mg/0.3 mL DEVI Inject 0.3 mg into the muscle daily as needed (allergic reaction).   . fluticasone (FLONASE) 50 MCG/ACT nasal spray Place 2 sprays into both nostrils 2 (two) times daily as needed. Reported on 09/10/2015  . glipiZIDE (GLUCOTROL XL) 10 MG 24 hr tablet Take 1 tablet (10 mg  total) by mouth daily with breakfast.  . glucose blood (ONE TOUCH ULTRA TEST) test strip CHECK BLOOD SUGAR ONCE DAILY AND AS NEEDED (DX. E11.9)  . losartan-hydrochlorothiazide (HYZAAR) 100-25 MG tablet Take 1 tablet by mouth daily.  . ondansetron (ZOFRAN) 4 MG tablet Take 1 tablet (4 mg total) by mouth every 8 (eight) hours as needed for nausea or vomiting.  . potassium chloride SA (K-DUR,KLOR-CON) 20 MEQ tablet Take 1 tablet (20 mEq total) by mouth daily.  . ticlopidine (TICLID) 250 MG tablet Take 1 tablet (250 mg total) by mouth 2 (two) times daily with a meal.  . vitamin B-12 (CYANOCOBALAMIN) 1000 MCG tablet Take 1,000 mcg by mouth daily.   No facility-administered encounter medications on file as of 08/27/2016.     Activities of Daily Living In your present state of health, do you have any difficulty performing the  following activities: 08/27/2016  Hearing? N  Vision? Y  Difficulty concentrating or making decisions? N  Walking or climbing stairs? N  Dressing or bathing? N  Doing errands, shopping? N  Preparing Food and eating ? N  Using the Toilet? N  In the past six months, have you accidently leaked urine? Y  Do you have problems with loss of bowel control? N  Managing your Medications? N  Managing your Finances? N  Housekeeping or managing your Housekeeping? N  Some recent data might be hidden    Patient Care Team: Abner Greenspan, MD as PCP - General Kennieth Francois, MD as Consulting Physician (Dermatology) Salomon Fick, MD as Referring Physician (Dentistry) Leandrew Koyanagi, MD as Referring Physician (Ophthalmology)    Assessment:     Hearing Screening   125Hz  250Hz  500Hz  1000Hz  2000Hz  3000Hz  4000Hz  6000Hz  8000Hz   Right ear:   40 0 40  40    Left ear:   0 0 0  40    Vision Screening Comments: Eye exam approx. 2 mths ago with Dr. Wallace Going   Exercise Activities and Dietary recommendations Current Exercise Habits: The patient does not participate in regular exercise at present, Exercise limited by: orthopedic condition(s) (knee and leg fatigue)  Goals    . Weight (lb) < 200 lb (90.7 kg)          Target weight is 180-185 lbs. Starting 08/28/16, I will continue to monitor intake of carbohydrates in an effort to keep A1C within normal limits and to lose weight.       Fall Risk Fall Risk  08/27/2016 08/22/2015  Falls in the past year? No Yes  Number falls in past yr: - 2 or more  Injury with Fall? - Yes   Depression Screen PHQ 2/9 Scores 08/27/2016 08/22/2015 04/28/2012  PHQ - 2 Score 0 0 0     Cognitive Function MMSE - Mini Mental State Exam 08/27/2016  Orientation to time 5  Orientation to Place 5  Registration 3  Attention/ Calculation 0  Recall 3  Language- name 2 objects 0  Language- repeat 1  Language- follow 3 step command 3  Language- read & follow  direction 0  Write a sentence 0  Copy design 0  Total score 20       PLEASE NOTE: A Mini-Cog screen was completed. Maximum score is 20. A value of 0 denotes this part of [place] MMSE was not completed or the patient failed this part of the Mini-Cog screening.   Mini-Cog Screening Orientation to Time - Max 5 pts Orientation to Place - Max 5 pts  Registration - Max 3 pts Recall - Max 3 pts Language Repeat - Max 1 pts Language Follow 3 Step Command - Max 3 pts   Immunization History  Administered Date(s) Administered  . Influenza Split 05/05/2011, 04/28/2012  . Influenza Whole 04/24/2005, 06/03/2010  . Influenza,inj,Quad PF,36+ Mos 05/16/2013  . Influenza-Unspecified 04/01/2014  . Pneumococcal Conjugate-13 08/22/2015  . Pneumococcal Polysaccharide-23 12/03/2010  . Td 11/27/1997, 10/28/2011   Screening Tests Health Maintenance  Topic Date Due  . INFLUENZA VACCINE  10/04/2025 (Originally 02/05/2016)  . MAMMOGRAM  09/16/2016  . OPHTHALMOLOGY EXAM  02/04/2017  . FOOT EXAM  02/18/2017  . HEMOGLOBIN A1C  02/24/2017  . COLONOSCOPY  03/17/2017  . TETANUS/TDAP  10/27/2021  . DEXA SCAN  Completed  . PNA vac Low Risk Adult  Completed      Plan:     I have personally reviewed and addressed the Medicare Annual Wellness questionnaire and have noted the following in the patient's chart:  A. Medical and social history B. Use of alcohol, tobacco or illicit drugs  C. Current medications and supplements D. Functional ability and status E.  Nutritional status F.  Physical activity G. Advance directives H. List of other physicians I.  Hospitalizations, surgeries, and ER visits in previous 12 months J.  [place] to include hearing, vision, cognitive, depression L. Referrals and appointments - none  In addition, I have reviewed and discussed with patient certain preventive protocols, quality metrics, and best practice recommendations. A written personalized care plan for  preventive services as well as general preventive health recommendations were provided to patient.  See attached scanned questionnaire for additional information.   Signed,   Lindell Noe, MHA, BS, LPN Health Coach

## 2016-08-27 NOTE — Progress Notes (Signed)
I reviewed health advisor's note, was available for consultation, and agree with documentation and plan.  

## 2016-08-27 NOTE — Progress Notes (Signed)
PCP notes:   Health maintenance:  Flu vaccine - patient declined A1C - completed  Abnormal screenings:   Hearing - failed  Patient concerns:   Patient has reported concerns with urinary incontinence. Please review the responses below to the questionnaire.   Urinary Incontinence Short Questionnaire 1. How often do you leak urine? Several times a day 2. How much urine do you normally leak (whether you wear protection or not)? A small amount 3. Overall, how much does leaking urine interfere with your everyday life?  Please circle a number between 0(not at all) and 10(a great deal). Patient's response: 3 4. When does urine leak?  Leaks before I can get to the toilet  Nurse concerns:  None  Next PCP appt:   09/02/16 @ 1030

## 2016-08-27 NOTE — Patient Instructions (Signed)
Ms. Downey , Thank you for taking time to come for your Medicare Wellness Visit. I appreciate your ongoing commitment to your health goals. Please review the following plan we discussed and let me know if I can assist you in the future.   These are the goals we discussed: Goals    . Weight (lb) < 200 lb (90.7 kg)          Target weight is 180-185 lbs. Starting 08/28/16, I will continue to monitor intake of carbohydrates in an effort to keep A1C within normal limits and to lose weight.        This is a list of the screening recommended for you and due dates:  Health Maintenance  Topic Date Due  . Flu Shot  10/04/2025*  . Mammogram  09/16/2016  . Eye exam for diabetics  02/04/2017  . Complete foot exam   02/18/2017  . Hemoglobin A1C  02/24/2017  . Colon Cancer Screening  03/17/2017  . Tetanus Vaccine  10/27/2021  . DEXA scan (bone density measurement)  Completed  . Pneumonia vaccines  Completed  *Topic was postponed. The date shown is not the original due date.   Preventive Care for Adults  A healthy lifestyle and preventive care can promote health and wellness. Preventive health guidelines for adults include the following key practices.  . A routine yearly physical is a good way to check with your health care provider about your health and preventive screening. It is a chance to share any concerns and updates on your health and to receive a thorough exam.  . Visit your dentist for a routine exam and preventive care every 6 months. Brush your teeth twice a day and floss once a day. Good oral hygiene prevents tooth decay and gum disease.  . The frequency of eye exams is based on your age, health, family medical history, use  of contact lenses, and other factors. Follow your health care provider's ecommendations for frequency of eye exams.  . Eat a healthy diet. Foods like vegetables, fruits, whole grains, low-fat dairy products, and lean protein foods contain the nutrients you need  without too many calories. Decrease your intake of foods high in solid fats, added sugars, and salt. Eat the right amount of calories for you. Get information about a proper diet from your health care provider, if necessary.  . Regular physical exercise is one of the most important things you can do for your health. Most adults should get at least 150 minutes of moderate-intensity exercise (any activity that increases your heart rate and causes you to sweat) each week. In addition, most adults need muscle-strengthening exercises on 2 or more days a week.  Silver Sneakers may be a benefit available to you. To determine eligibility, you may visit the website: www.silversneakers.com or contact program at 917-167-7482 Mon-Fri between 8AM-8PM.   . Maintain a healthy weight. The body mass index (BMI) is a screening tool to identify possible weight problems. It provides an estimate of body fat based on height and weight. Your health care provider can find your BMI and can help you achieve or maintain a healthy weight.   For adults 20 years and older: ? A BMI below 18.5 is considered underweight. ? A BMI of 18.5 to 24.9 is normal. ? A BMI of 25 to 29.9 is considered overweight. ? A BMI of 30 and above is considered obese.   . Maintain normal blood lipids and cholesterol levels by exercising and minimizing your intake  of saturated fat. Eat a balanced diet with plenty of fruit and vegetables. Blood tests for lipids and cholesterol should begin at age 83 and be repeated every 5 years. If your lipid or cholesterol levels are high, you are over 50, or you are at high risk for heart disease, you may need your cholesterol levels checked more frequently. Ongoing high lipid and cholesterol levels should be treated with medicines if diet and exercise are not working.  . If you smoke, find out from your health care provider how to quit. If you do not use tobacco, please do not start.  . If you choose to drink  alcohol, please do not consume more than 2 drinks per day. One drink is considered to be 12 ounces (355 mL) of beer, 5 ounces (148 mL) of wine, or 1.5 ounces (44 mL) of liquor.  . If you are 22-82 years old, ask your health care provider if you should take aspirin to prevent strokes.  . Use sunscreen. Apply sunscreen liberally and repeatedly throughout the day. You should seek shade when your shadow is shorter than you. Protect yourself by wearing long sleeves, pants, a wide-brimmed hat, and sunglasses year round, whenever you are outdoors.  . Once a month, do a whole body skin exam, using a mirror to look at the skin on your back. Tell your health care provider of new moles, moles that have irregular borders, moles that are larger than a pencil eraser, or moles that have changed in shape or color.

## 2016-08-29 ENCOUNTER — Telehealth: Payer: Self-pay | Admitting: *Deleted

## 2016-08-29 NOTE — Telephone Encounter (Signed)
Advised Margaret Vazquez that her [name] is RRT and that we can schedule her an appt with Dr. Caryl Comes to discuss next steps. She is agreeable. Sent to scheduling.

## 2016-08-29 NOTE — Telephone Encounter (Signed)
[facility] requesting call back.  LINQ at RRT as of 08/19/16.  Will offer appointment to discuss explant.  Otherwise, will keep follow-up as scheduled.

## 2016-09-01 LAB — CUP PACEART REMOTE DEVICE CHECK
Date Time Interrogation Session: 20180209133857
Implantable Pulse Generator Implant Date: 20140813

## 2016-09-01 NOTE — Telephone Encounter (Signed)
Unenrolled from [employer], return kit ordered to patient's home address. 

## 2016-09-02 ENCOUNTER — Encounter: Payer: Self-pay | Admitting: Family Medicine

## 2016-09-02 ENCOUNTER — Ambulatory Visit (INDEPENDENT_AMBULATORY_CARE_PROVIDER_SITE_OTHER): Payer: PPO | Admitting: Family Medicine

## 2016-09-02 VITALS — BP 122/60 | HR 63 | Temp 98.2°F | Ht 68.0 in | Wt 198.5 lb

## 2016-09-02 DIAGNOSIS — E7800 Pure hypercholesterolemia, unspecified: Secondary | ICD-10-CM

## 2016-09-02 DIAGNOSIS — I10 Essential (primary) hypertension: Secondary | ICD-10-CM

## 2016-09-02 DIAGNOSIS — I480 Paroxysmal atrial fibrillation: Secondary | ICD-10-CM

## 2016-09-02 DIAGNOSIS — E1159 Type 2 diabetes mellitus with other circulatory complications: Secondary | ICD-10-CM | POA: Diagnosis not present

## 2016-09-02 DIAGNOSIS — E538 Deficiency of other specified B group vitamins: Secondary | ICD-10-CM | POA: Diagnosis not present

## 2016-09-02 DIAGNOSIS — Z0000 Encounter for general adult medical examination without abnormal findings: Secondary | ICD-10-CM

## 2016-09-02 DIAGNOSIS — I639 Cerebral infarction, unspecified: Secondary | ICD-10-CM | POA: Diagnosis not present

## 2016-09-02 MED ORDER — LOSARTAN POTASSIUM-HCTZ 100-25 MG PO TABS: 1.0000 | ORAL_TABLET | Freq: Every day | ORAL | 3 refills | Status: DC

## 2016-09-02 MED ORDER — POTASSIUM CHLORIDE CRYS ER 20 MEQ PO TBCR: 20.0000 meq | EXTENDED_RELEASE_TABLET | Freq: Every day | ORAL | 3 refills | Status: DC

## 2016-09-02 MED ORDER — GLIPIZIDE ER 10 MG PO TB24: 10.0000 mg | ORAL_TABLET | Freq: Every day | ORAL | 3 refills | Status: DC

## 2016-09-02 NOTE — Progress Notes (Signed)
Subjective:    Patient ID: Margaret Vazquez, female    DOB: 1936/04/06, 81 y.o.   MRN: [id number]  HPI  Here for health maintenance exam and to review chronic medical problems    Had AMW visit this mo  Declined flu shot - afraid  Hearing failed screen L ear - she is not bothered by it    Wt Readings from Last 3 Encounters:  09/02/16 198 lb 8 oz (90 kg)  08/27/16 195 lb 8 oz (88.7 kg)  07/16/16 201 lb (91.2 kg)  down from Jan  Eating is ok -the same / avoids sweets and red meat  Exercise  bmi 30.1  She may be allergic to meat - had a bad experience in the past  Hives are primary symptom followed by wheezing    Mammogram neg 3/17 Self breast exam - no lumps or changes   Pap/gyn care 11/11 nl pap-no symptoms at all /no bleeding or d/c or pain   Zoster vaccine - declines   Colonoscopy/ screening 9/13 with 5 year recall for colonoscopy She still has some fecal incontinence   dexa normal in 5/13 No falls  No fractures  Not taking calcium or D   bp is stable today  No cp or palpitations or headaches or edema  No side effects to medicines  BP Readings from Last 3 Encounters:  09/02/16 122/60  08/27/16 118/80  07/16/16 120/60      DM 2 Lab Results  Component Value Date   HGBA1C 6.9 (H) 08/27/2016  was 6.5  On glipizide She has cut back on sweets / not carbs  Exercise - none now/likes to walk  Arb for renal protection   Hx of a fib  Has a loop recorder  No issues/ stable    hx of CVA-no new events or symptoms   Hx of hyperlipidemia Lab Results  Component Value Date   CHOL 197 08/27/2016   CHOL 179 02/12/2016   CHOL 201 (H) 08/16/2015   Lab Results  Component Value Date   HDL 40.50 08/27/2016   HDL 46.00 02/12/2016   HDL 55.00 08/16/2015   Lab Results  Component Value Date   LDLCALC 104 (H) 02/12/2016   LDLCALC 120 (H) 08/16/2015   LDLCALC 125 (H) 11/15/2014   Lab Results  Component Value Date   TRIG 215.0 (H) 08/27/2016   TRIG 147.0  02/12/2016   TRIG 130.0 08/16/2015   Lab Results  Component Value Date   CHOLHDL 5 08/27/2016   CHOLHDL 4 02/12/2016   CHOLHDL 4 08/16/2015   Lab Results  Component Value Date   LDLDIRECT 107.0 08/27/2016   LDLDIRECT 136.5 10/20/2011   LDLDIRECT 134.7 08/08/2008  intolerant of statins  Trig up -= with glucose a bit  LDL down   Hx of low B12 Lab Results  Component Value Date   VITAMINB12 664 08/27/2016     Patient Active Problem List   Diagnosis Date Noted  . Routine general medical examination at a health care facility 08/24/2016  . Paroxysmal atrial fibrillation ([place]) 04/07/2016  . Fall 08/13/2015  . Left ankle pain 08/13/2015  . Left knee pain 06/14/2015  . Obesity (BMI 30-39.9) 08/23/2014  . Type 2 diabetes mellitus with vascular disease ([place]) 01/12/2014  . TIA (transient ischemic attack) 01/11/2014  . CVA (cerebral infarction) 01/11/2014  . Pain of right thumb 11/14/2013  . Left hip pain 11/14/2013  . LOOP Recorder LINQ 06/07/2013  . Fatigue 06/07/2013  . Sinusitis, chronic  05/16/2013  . Acute ischemic stroke ([place]) 02/15/2013  . Numbness and tingling of right arm 02/14/2013  . Encounter for Medicare annual wellness exam 10/27/2012  . Encopresis(307.7) 12/15/2011  . Post-menopausal 10/28/2011  . Hives 02/05/2011  . PERSONAL HX COLONIC POLYPS 09/04/2009  . B12 deficiency 03/06/2009  . UNSPECIFIED ANEMIA 02/13/2009  . ALLERGIC RHINITIS 02/14/2008  . BACK PAIN, LUMBAR 11/03/2007  . Hyperlipidemia 11/24/2006  . Essential hypertension 11/24/2006  . GERD 10/09/2006  . OVERACTIVE BLADDER 10/09/2006  . INCONTINENCE, URGE 10/09/2006  . SKIN CANCER, HX OF 10/09/2006   Past Medical History:  Diagnosis Date  . Allergy history, drug    Aspirin  . CAD (coronary artery disease) 04/08/11   non obst by cath  . Cervical stenosis of spine    With neck pain  . Colon polyps 2009  . Diabetes mellitus    type II  . GERD (gastroesophageal reflux disease)   .  Hyperlipidemia    myalgias with Lipitor and Zetia  . Hypertension   . Migraine   . Skin cancer    hx of basal cell/ak's  . Stroke [facility]) August 2014  . TIA (transient ischemic attack)    Past Surgical History:  Procedure Laterality Date  . ABD [place]  07/2003   Negative  . APPENDECTOMY    . BREAST SURGERY  1992   breast biopsy  . CARDIAC CATHETERIZATION  04/07/2011   non obst CAD (Dr Burt Knack)  . COLONOSCOPY  12/2007   Adenomatous colon polyps  . CYSTOSCOPY W/ DECANNULATION  03/2000   Normal  . EYE SURGERY  2005   tear duct surgery  . LOOP RECORDER IMPLANT N/A 02/16/2013   Procedure: LOOP RECORDER IMPLANT;  Surgeon: Deboraha Sprang, MD;  Location: [facility] CATH LAB;  Service: Cardiovascular;  Laterality: N/A;  . NASAL SINUS SURGERY  01/2005  . STRESS CARDIOLITE  11/1999   Normal/ negative  . [place] DUCT SURGERY  2005  . TEE WITHOUT CARDIOVERSION N/A 02/16/2013   Procedure: TRANSESOPHAGEAL ECHOCARDIOGRAM (TEE);  Surgeon: Josue Hector, MD;  Location: [facility] ENDOSCOPY;  Service: Cardiovascular;  Laterality: N/A;  . TUBAL LIGATION     BTL   Social History  Substance Use Topics  . Smoking status: Never Smoker  . Smokeless tobacco: Never Used  . Alcohol use No   Family History  Problem Relation Age of Onset  . Lung cancer Brother   . Diabetes Brother   . Pancreatic cancer Brother   . Heart disease Mother   . Heart disease Father   . Brain cancer Other   . Skin cancer Daughter   . Diabetes Sister   . Colon cancer Neg Hx    Allergies  Allergen Reactions  . Bee Venom Hives, Shortness Of Breath and Swelling  . Nabumetone Anaphylaxis  . Amoxicillin-Pot Clavulanate Hives and Swelling    To lips.  . Aspirin Hives  . Atorvastatin Swelling     joint pain/swelling, inc liver tests  . Clopidogrel Bisulfate Hives  . Codeine Nausea And Vomiting  . Ezetimibe Other (See Comments)     fatigue  . Metformin And Related Other (See Comments)    Diarrhea   . Valsartan Other (See Comments)      fatigue  . Other Hives    **Red Meat**   Current Outpatient Prescriptions on File Prior to Visit  Medication Sig Dispense Refill  . diphenhydrAMINE (BENADRYL) 25 MG tablet Take 50 mg by mouth every 8 (eight) hours as needed for allergies.     Marland Kitchen  diphenoxylate-atropine (LOMOTIL) 2.5-0.025 MG/5ML liquid Take 5 mLs by mouth 4 (four) times daily as needed for diarrhea or loose stools. Caution of sedation 60 mL 0  . EPINEPHrine (EPI-PEN) 0.3 mg/0.3 mL DEVI Inject 0.3 mg into the muscle daily as needed (allergic reaction).     . fluticasone (FLONASE) 50 MCG/ACT nasal spray Place 2 sprays into both nostrils 2 (two) times daily as needed. Reported on 09/10/2015    . glucose blood (ONE TOUCH ULTRA TEST) test strip CHECK BLOOD SUGAR ONCE DAILY AND AS NEEDED (DX. E11.9) 100 each 1  . ticlopidine (TICLID) 250 MG tablet Take 1 tablet (250 mg total) by mouth 2 (two) times daily with a meal. 180 tablet 0  . vitamin B-12 (CYANOCOBALAMIN) 1000 MCG tablet Take 1,000 mcg by mouth daily.    . [DISCONTINUED] levETIRAcetam (KEPPRA) 250 MG tablet Take 1 tablet (250 mg total) by mouth 2 (two) times daily. 60 tablet 0   No current facility-administered medications on file prior to visit.     Review of Systems    Review of Systems  Constitutional: Negative for fever, appetite change,  and unexpected weight change.  Eyes: Negative for pain and visual disturbance.  Respiratory: Negative for cough and shortness of breath.   Cardiovascular: Negative for cp or palpitations    Gastrointestinal: Negative for nausea, diarrhea and constipation.  Genitourinary: Negative for urgency and frequency.  Skin: Negative for pallor or rash   pos for occ hives / ? What allergic to Neurological: Negative for weakness, light-headedness, numbness and headaches.  Hematological: Negative for adenopathy. Does not bruise/bleed easily.  Psychiatric/Behavioral: Negative for dysphoric mood. The patient is not nervous/anxious.      Objective:     Physical Exam  Constitutional: She appears well-developed and well-nourished. No distress.  overwt   HENT:  Head: Normocephalic and atraumatic.  Right Ear: External ear normal.  Left Ear: External ear normal.  Mouth/Throat: Oropharynx is clear and moist.  Boggy nares   Eyes: Conjunctivae and EOM are normal. Pupils are equal, round, and reactive to light. No scleral icterus.  Neck: Normal range of motion. Neck supple. No JVD present. Carotid bruit is not present. No thyromegaly present.  Cardiovascular: Normal rate, regular rhythm, normal heart sounds and intact distal pulses.  Exam reveals no gallop.   Pulmonary/Chest: Effort normal and breath sounds normal. No respiratory distress. She has no wheezes. She exhibits no tenderness.  Abdominal: Soft. Bowel sounds are normal. She exhibits no distension, no abdominal bruit and no mass. There is no tenderness.  Genitourinary: No breast swelling, tenderness, discharge or bleeding.  Genitourinary Comments: Breast exam: No mass, nodules, thickening, tenderness, bulging, retraction, inflamation, nipple discharge or skin changes noted.  No axillary or clavicular LA.      Musculoskeletal: Normal range of motion. She exhibits no edema or tenderness.  Lymphadenopathy:    She has no cervical adenopathy.  Neurological: She is alert. She has normal reflexes. No cranial nerve deficit. She exhibits normal muscle tone. Coordination normal.  Skin: Skin is warm and dry. No rash noted. No erythema. No pallor.  Psychiatric: She has a normal mood and affect.          Assessment & Plan:   Problem List Items Addressed This Visit      Cardiovascular and Mediastinum   Acute ischemic stroke ([place])    No new stroke symptoms  Doing well  Controlling risk factors Has loop recorder       Relevant Medications   losartan-hydrochlorothiazide (  HYZAAR) 100-25 MG tablet   Essential hypertension    bp in fair control at this time  BP Readings from Last 1  Encounters:  09/02/16 122/60   No changes needed Disc lifstyle change with low sodium diet and exercise  Labs reviewed       Relevant Medications   losartan-hydrochlorothiazide (HYZAAR) 100-25 MG tablet   Paroxysmal atrial fibrillation (HCC)    Stable- has loop recorder        Relevant Medications   losartan-hydrochlorothiazide (HYZAAR) 100-25 MG tablet   Type 2 diabetes mellitus with vascular disease ([place]) - Primary    Lab Results  Component Value Date   HGBA1C 6.9 (H) 08/27/2016   This is up from 6.5 due to poor diet Plan made for red in refined carbs and enc exercise  Disc eye and foot care On arb for renal protection       Relevant Medications   losartan-hydrochlorothiazide (HYZAAR) 100-25 MG tablet   glipiZIDE (GLUCOTROL XL) 10 MG 24 hr tablet     Other   B12 deficiency    Stable on current supplementation       Hyperlipidemia    Disc goals for lipids and reasons to control them Rev labs with pt Rev low sat fat diet in detail shi is intolerant of statins  Enc low carb diet for trig  LDL is down a bit       Relevant Medications   losartan-hydrochlorothiazide (HYZAAR) 100-25 MG tablet   Routine general medical examination at a health care facility    Reviewed health habits including diet and exercise and skin cancer prevention Reviewed appropriate screening tests for age  Also reviewed health mt list, fam hx and immunization status , as well as social and family history   See HPI Reviewed AMW visit Reviewed labs Wt loss enc and exercise She will schedule mammog for march Colonoscopy due 9/18- she is aware  Nl dexa 5/13

## 2016-09-02 NOTE — Progress Notes (Signed)
Pre visit review using our clinic review tool, if applicable. No additional management support is needed unless otherwise documented below in the visit note. 

## 2016-09-02 NOTE — Patient Instructions (Addendum)
I will work on an allergy referral this or next week  Don't forget to schedule your mammogram next month You are due for a colonoscopy 9/18  Try to get 1200-1500 mg of calcium per day with at least 1000 iu of vitamin D - for bone health   Exercise - aim for 30 or more minutes 5 days per week   Watch sweets Now focus on carbs- bread/pasta/rice/white potatoes and snack foods   Follow up in 6 months with labs prior

## 2016-09-03 ENCOUNTER — Telehealth: Payer: Self-pay | Admitting: Family Medicine

## 2016-09-03 DIAGNOSIS — L509 Urticaria, unspecified: Secondary | ICD-10-CM

## 2016-09-03 NOTE — Telephone Encounter (Signed)
Pt needs referral to allergist for urticaria  Will route to [facility] thanks

## 2016-09-04 NOTE — Assessment & Plan Note (Signed)
Lab Results  Component Value Date   HGBA1C 6.9 (H) 08/27/2016   This is up from 6.5 due to poor diet Plan made for red in refined carbs and enc exercise  Disc eye and foot care On arb for renal protection

## 2016-09-04 NOTE — Assessment & Plan Note (Signed)
No new stroke symptoms  Doing well  Controlling risk factors Has loop recorder

## 2016-09-04 NOTE — Assessment & Plan Note (Signed)
Disc goals for lipids and reasons to control them Rev labs with pt Rev low sat fat diet in detail shi is intolerant of statins  Enc low carb diet for trig  LDL is down a bit

## 2016-09-04 NOTE — Assessment & Plan Note (Signed)
Stable- has loop recorder

## 2016-09-04 NOTE — Assessment & Plan Note (Signed)
bp in fair control at this time  BP Readings from Last 1 Encounters:  09/02/16 122/60   No changes needed Disc lifstyle change with low sodium diet and exercise  Labs reviewed

## 2016-09-04 NOTE — Assessment & Plan Note (Signed)
Reviewed health habits including diet and exercise and skin cancer prevention Reviewed appropriate screening tests for age  Also reviewed health mt list, fam hx and immunization status , as well as social and family history   See HPI Reviewed AMW visit Reviewed labs Wt loss enc and exercise She will schedule mammog for march Colonoscopy due 9/18- she is aware  Nl dexa 5/13

## 2016-09-04 NOTE — Assessment & Plan Note (Signed)
Stable on current supplementation

## 2016-09-08 DIAGNOSIS — L570 Actinic keratosis: Secondary | ICD-10-CM | POA: Diagnosis not present

## 2016-09-08 DIAGNOSIS — X32XXXA Exposure to sunlight, initial encounter: Secondary | ICD-10-CM | POA: Diagnosis not present

## 2016-09-22 ENCOUNTER — Other Ambulatory Visit: Payer: Self-pay | Admitting: Family Medicine

## 2016-09-22 DIAGNOSIS — Z1231 Encounter for screening mammogram for malignant neoplasm of breast: Secondary | ICD-10-CM

## 2016-09-24 ENCOUNTER — Ambulatory Visit
Admission: RE | Admit: 2016-09-24 | Discharge: 2016-09-24 | Disposition: A | Discharge status: Home / Self Care | Payer: PPO | Source: Ambulatory Visit | Attending: Family Medicine | Admitting: Family Medicine

## 2016-09-24 DIAGNOSIS — Z1231 Encounter for screening mammogram for malignant neoplasm of breast: Secondary | ICD-10-CM

## 2016-09-25 DIAGNOSIS — T63444D Toxic effect of venom of bees, undetermined, subsequent encounter: Secondary | ICD-10-CM | POA: Diagnosis not present

## 2016-09-25 DIAGNOSIS — J3089 Other allergic rhinitis: Secondary | ICD-10-CM | POA: Diagnosis not present

## 2016-09-25 DIAGNOSIS — T781XXA Other adverse food reactions, not elsewhere classified, initial encounter: Secondary | ICD-10-CM | POA: Diagnosis not present

## 2016-09-25 DIAGNOSIS — L509 Urticaria, unspecified: Secondary | ICD-10-CM | POA: Diagnosis not present

## 2016-10-14 ENCOUNTER — Telehealth: Payer: Self-pay

## 2016-10-14 MED ORDER — ONETOUCH DELICA LANCETS 33G MISC: 1 refills | Status: DC

## 2016-10-14 NOTE — Telephone Encounter (Signed)
Pt request one touch delica lancets. Refilled per protocol.

## 2016-10-20 DIAGNOSIS — L570 Actinic keratosis: Secondary | ICD-10-CM | POA: Diagnosis not present

## 2016-10-20 DIAGNOSIS — Z85828 Personal history of other malignant neoplasm of skin: Secondary | ICD-10-CM | POA: Diagnosis not present

## 2016-10-20 DIAGNOSIS — D045 Carcinoma in situ of skin of trunk: Secondary | ICD-10-CM | POA: Diagnosis not present

## 2016-10-20 DIAGNOSIS — D485 Neoplasm of uncertain behavior of skin: Secondary | ICD-10-CM | POA: Diagnosis not present

## 2016-10-28 ENCOUNTER — Encounter: Payer: Self-pay | Admitting: Internal Medicine

## 2016-10-28 ENCOUNTER — Ambulatory Visit (INDEPENDENT_AMBULATORY_CARE_PROVIDER_SITE_OTHER): Payer: PPO | Admitting: Internal Medicine

## 2016-10-28 VITALS — BP 130/66 | HR 69 | Ht 69.0 in | Wt 198.3 lb

## 2016-10-28 DIAGNOSIS — I639 Cerebral infarction, unspecified: Secondary | ICD-10-CM | POA: Diagnosis not present

## 2016-10-28 NOTE — Progress Notes (Signed)
[email or web address]      Patient Care Team: Abner Greenspan, MD as PCP - General Kennieth Francois, MD as Consulting Physician (Dermatology) Salomon Fick, MD as Referring Physician (Dentistry) Leandrew Koyanagi, MD as Referring Physician (Ophthalmology)   HPI  Margaret Vazquez is a 81 y.o. female Seen in followup for a loop recorder implanted for cryptogenic stroke. She has significant hypertension which is much improved  She was found to have atrial fibrillation that prompted a visit to the atrial fibrillation clinic.This event occurred in the context of treatment anaphylaxis w epinephrine.. Discussion with Dr. Leonie Man suggested no indication for anticoagulation  No interval palpitations.  Denies shortness of breath or edema  She has Carotid disease but has been intolerant of statins      Past Medical History:  Diagnosis Date  . Allergy history, drug    Aspirin  . CAD (coronary artery disease) 04/08/11   non obst by cath  . Cervical stenosis of spine    With neck pain  . Colon polyps 2009  . Diabetes mellitus    type II  . GERD (gastroesophageal reflux disease)   . Hyperlipidemia    myalgias with Lipitor and Zetia  . Hypertension   . Migraine   . Skin cancer    hx of basal cell/ak's  . Stroke [facility]) August 2014  . TIA (transient ischemic attack)     Past Surgical History:  Procedure Laterality Date  . ABD [place]  07/2003   Negative  . APPENDECTOMY    . BREAST EXCISIONAL BIOPSY Left   . BREAST SURGERY  1992   breast biopsy  . CARDIAC CATHETERIZATION  04/07/2011   non obst CAD (Dr Burt Knack)  . COLONOSCOPY  12/2007   Adenomatous colon polyps  . CYSTOSCOPY W/ DECANNULATION  03/2000   Normal  . EYE SURGERY  2005   tear duct surgery  . LOOP RECORDER IMPLANT N/A 02/16/2013   Procedure: LOOP RECORDER IMPLANT;  Surgeon: Deboraha Sprang, MD;  Location: [facility] CATH LAB;  Service: Cardiovascular;  Laterality: N/A;  . NASAL SINUS SURGERY  01/2005  . STRESS CARDIOLITE  11/1999   Normal/  negative  . [place] DUCT SURGERY  2005  . TEE WITHOUT CARDIOVERSION N/A 02/16/2013   Procedure: TRANSESOPHAGEAL ECHOCARDIOGRAM (TEE);  Surgeon: Josue Hector, MD;  Location: [facility] ENDOSCOPY;  Service: Cardiovascular;  Laterality: N/A;  . TUBAL LIGATION     BTL    Current Outpatient Prescriptions  Medication Sig Dispense Refill  . diphenhydrAMINE (BENADRYL) 25 MG tablet Take 50 mg by mouth every 8 (eight) hours as needed for allergies.     Marland Kitchen EPINEPHrine (EPI-PEN) 0.3 mg/0.3 mL DEVI Inject 0.3 mg into the muscle daily as needed (allergic reaction).     . fluticasone (FLONASE) 50 MCG/ACT nasal spray Place 2 sprays into both nostrils 2 (two) times daily as needed. Reported on 09/10/2015    . glipiZIDE (GLUCOTROL XL) 10 MG 24 hr tablet Take 1 tablet (10 mg total) by mouth daily with breakfast. 90 tablet 3  . glucose blood (ONE TOUCH ULTRA TEST) test strip CHECK BLOOD SUGAR ONCE DAILY AND AS NEEDED (DX. E11.9) 100 each 1  . losartan-hydrochlorothiazide (HYZAAR) 100-25 MG tablet Take 1 tablet by mouth daily. 90 tablet 3  . ONETOUCH DELICA LANCETS 99991111 MISC Check blood sugar once daily and as directed. Dx E11.9 100 each 1  . potassium chloride SA (K-DUR,KLOR-CON) 20 MEQ tablet Take 1 tablet (20 mEq total) by mouth daily. 90 tablet  3  . ticlopidine (TICLID) 250 MG tablet Take 1 tablet (250 mg total) by mouth 2 (two) times daily with a meal. 180 tablet 0   No current facility-administered medications for this visit.     Allergies  Allergen Reactions  . Bee Venom Hives, Shortness Of Breath and Swelling  . Nabumetone Anaphylaxis  . Amoxicillin-Pot Clavulanate Hives and Swelling    To lips.  . Aspirin Hives  . Atorvastatin Swelling     joint pain/swelling, inc liver tests  . Clopidogrel Bisulfate Hives  . Codeine Nausea And Vomiting  . Ezetimibe Other (See Comments)     fatigue  . Metformin And Related Other (See Comments)    Diarrhea   . Valsartan Other (See Comments)     fatigue  . Other Hives     **Red Meat**    Review of Systems negative except from HPI and PMH  Physical Exam BP 130/66 (BP Location: Left Arm, Patient Position: Sitting, Cuff Size: Normal)   Pulse 69   Ht '5\' 9"'$  (1.753 m)   Wt 198 lb 5 oz (90 kg)   BMI 29.29 kg/m  Well developed and well nourished in no acute distress HENT normal E scleral and icterus clear Neck Supple JVP flat; carotids brisk and full Clear to ausculation Device pocket well healed; without hematoma or erythema.  There is no tethering   Regular rate and rhythm, no murmurs gallops or rub Soft with active bowel sounds No clubbing cyanosis no Edema Alert and oriented, grossly normal motor and sensory function Skin Warm and Dry  ECG Personally reviewed  demonstrates sinus rhythm at 69 intervals 18/09/41 Otherwise normal  Assessment and  Plan  Cryptogenic stroke  Implantable loop recorder  Elevated blood pressure  Hyperlipidemia  Carotid disease   Loop recorder has reached ERI  She has decided NOT to remove at this time  Her carotid disease and her dyslipidimia begs alternative therapy esp since she can not take statins.   Is she a candidate for JARDIANCE  Will defer to PCP and Dr Leonie Man \ BP is reasonable given her diabetes

## 2016-10-28 NOTE — Patient Instructions (Signed)
Medication Instructions: - Your physician recommends that you continue on your current medications as directed. Please refer to the Current Medication list given to you today.  Labwork: - none ordered  Procedures/Testing: - none ordered  Follow-Up: - Dr. Klein will see you back on an as needed basis.  Any Additional Special Instructions Will Be Listed Below (If Applicable).     If you need a refill on your cardiac medications before your next appointment, please call your pharmacy.   

## 2016-10-29 ENCOUNTER — Encounter: Payer: Self-pay | Admitting: Nurse Practitioner

## 2016-10-29 ENCOUNTER — Ambulatory Visit (INDEPENDENT_AMBULATORY_CARE_PROVIDER_SITE_OTHER): Payer: PPO | Admitting: Nurse Practitioner

## 2016-10-29 ENCOUNTER — Telehealth: Payer: Self-pay | Admitting: *Deleted

## 2016-10-29 VITALS — BP 145/71 | HR 61 | Wt 200.0 lb

## 2016-10-29 DIAGNOSIS — I10 Essential (primary) hypertension: Secondary | ICD-10-CM

## 2016-10-29 DIAGNOSIS — Z8673 Personal history of transient ischemic attack (TIA), and cerebral infarction without residual deficits: Secondary | ICD-10-CM | POA: Diagnosis not present

## 2016-10-29 NOTE — Progress Notes (Signed)
[facility]  PATIENT: Margaret Vazquez DOB: [birth year]   REASON FOR VISIT: Follow-up for ischemic stroke, hypertension diabetes and hyperlipidemia HISTORY FROM: Patient    HISTORY OF PRESENT ILLNESS:05/11/13 (PS): Margaret Vazquez is a 49 year Caucasian lady who is seen for followup of recent hospital consults in August and October 2014. She was initially seen on 02/16/13 and when she was admitted for evaluation for multiple episodes of transient right arm numbness and tingling. Her symptoms got worse on 02/14/13 and she had difficulty eating with a fork as well as writing. MRI scan the brain showed multifocal infarcts in the left frontal and parietal MCA territory an MRI of the brain showed severe proximal left M1 stenosis. Transthoracic echocardiogram showed normal ejection fraction. Trans esophageal echocardiogram showed no cardiac source of embolism or PFO. Hemoglobin A1c was 6.5%. Total cholesterol was 172, triglycerides 161, HDL 41 and LDL 99 mg percent. She was started on aspirin and Plavix for stroke prevention and advised aggressive risk factor control. She also had a loop recorder implanted for atrial fibrillation detection. She subsequently returned on 04/25/39 and with transient episode of right upper extremity no weakness in addition to stiffening and twisting of her right arm and inability to control it. She was evaluated for possible sensory seizures with EEG, which was negative but she was started on low-dose Keppra 250 twice a day. She states she had trouble tolerating Keppra and developed itching as well as rash and was seen on the emergency room on 05/05/13 where Keppra was discontinued and she was switched to gabapentin. She always states that she is having trouble tolerating it and complains of sleepiness on it even though she is taking only 1 tablet daily. She had a CT head on 1019/14 which was stable. She complains of tiredness and decreased stamina. She's noticed that  she still has some diminished fine motor skills in the right hand. She states her blood pressure and sugars have been under good control. She remains on ticlopidine for secondary stroke prevention/she has an aspirin allergy and complained of feeling lethargic while taking Plavix in the past. She has seen me in the past for cervicogenic muscle tension headaches and tremors and was last in the office in 2013. UPDATE 02/22/14 (LL): Since last visit Margaret Vazquez was hospitalized in July 2015 for concern of stroke. She had 4 separate episodes of her left hand drawing up and decided to proceed to the emergency room. Episodes are similar to what she had in the right hand last year. MRI was negative for acute infarct. EEG was ordered in the hospital but not completed. She's had 2 similar episodes since leaving the hospital, on the left side also. Her blood pressure is been under good control it is 115/65 in the office today. She states that her cholesterol is under good control, and indeed it is below goal on no statin medication. She's tolerating Ticlid well without significant bleeding or bruising. She has no other complaints. Update 11/01/2014 PS: she returns for follow-up after last visit in August 2015. She is doing better now and has not had any abnormal involuntary movements or seizure-like jerking activity. She was unable to tolerate Keppra and gabapentin in the past but these episodes. She had prolonged EEG done after last visit which have personally reviewed and showed no evidence of epileptiform activity. She continues to have mild diminished fine motor skills in the right hand and some weakness but it is unchanged. Her sister had a stroke  3 weeks ago. She has some mild intermittent tingling in her feet likely from her diabetic neuropathy but this is not bothersome. She states her blood pressure is well controlled. Her fasting sugars usually range in the 110 range and last hemoglobin A1c is slightly elevated at  7.3. She plans to see her primary physician next week and will have lipid profile checked. She has a loop recorder in place and so far atrial fibrillation has not been discovered. She is tolerating Ticlid well without any side effects. She has not had any repeat Doppler studies done for the brain and neck in more than a year. UPDATE 10/25/2016CM. Margaret Vazquez, 81 year old female returns for stroke follow-up.she has risk factors of hypertension , hyperlipidemia and diabetes. She had an episode of of her left hand drawing [place] She went to a local urgent care and her shoulder was injected with relief. She has not had further problems.She continues to have mild diminished fine motor skills in the right hand but this is unchanged. She claims her hemoglobin A1c was 6.3 at last check. She continues to have a loop recorder in place no atrial fibrillation has not been discovered. She is on Ticlid for secondary stroke prevention and gets this from [place].Carotid Doppler study after last visit on 11/29/14 Consistent with 50-69% reduction in the right proximal ICA.She returns for reevaluation. Last lipid panel 11/15/2014 with cholesterol 203, triglycerides 157, and LDL 125. She returns for reevaluation UPDATE 10/30/2015 CMMs. Margaret Vazquez, 81 year old female returns for stroke follow up with risk factors of hypertension hyperlipidemia and diabetes. She denies any episodes of her left hand drawing. She continues to have mild diminished fine motor skills in the right hand that this is unchanged most recent hemoglobin A1c 6.9. She continues to have loop recorder in place without atrial fibrillation being discovered. She is on Ticlid for secondary stroke prevention. She has an  allergy to aspirin and did not feel well on Plavix. Last Doppler study consistent with 50-69% reduction in right proximal ICA last LDL 120 this was in February. She complains of some blurred vision but has not  seen an ophthalmologist in over a year. Recently found to have low vitamin B12 level and is currently receiving injections She returns for reevaluation. Remains active continues to drive etc. UPDATE 40/34/7425ZD Margaret Vazquez, 81 year old female returns for follow-up with history of stroke left frontal and parietal MCA territory an MRI of the brain showed severe proximal left M1 stenosis in 2014. She remains on Ticlid for secondary stroke prevention and has not had further stroke or TIA symptoms. She has some bruising but not bleeding.. She has an allergy to aspirin and did not feel well on Plavix. Her stroke risk factors are hypertension hyperlipidemia diabetes. She claims her diabetes is in good control. Margaret. recent hemoglobin A1c to 2/21/ 2018. Was 6.9.  Blood pressure in the office today 145/74. Lipid panel to 2/ 21/  2018 with mildly elevated triglycerides otherwise within normal limits. Last carotid Doppler 11/12/2015 stable from previous without change. She remains very active. Since last seen she had a bad allergic reaction to red meat .She returns for reevaluation REVIEW OF SYSTEMS: Full 14 system review of systems performed and notable only for those listed, all others are neg:  Constitutional: Fatigue Cardiovascular: neg Ear/Nose/Throat: neg  Skin: neg Eyes: neg Respiratory: neg Gastroitestinal: neg  Hematology/Lymphatic: neg  Endocrine: neg Musculoskeletal:neg Allergy/Immunology: Food allergies Neurological: neg Psychiatric: neg Sleep : neg  ALLERGIES: Allergies  Allergen Reactions  . Bee Venom Hives, Shortness Of Breath and Swelling  . Nabumetone Anaphylaxis  . Amoxicillin-Pot Clavulanate Hives and Swelling    To lips.  . Aspirin Hives  . Atorvastatin Swelling     joint pain/swelling, inc liver tests  . Clopidogrel Bisulfate Hives  . Codeine Nausea And Vomiting  . Ezetimibe Other (See Comments)     fatigue  . Metformin And Related Other (See Comments)    Diarrhea   .  Valsartan Other (See Comments)     fatigue  . Other Hives    **Red Meat**  SOB    HOME MEDICATIONS: Outpatient Medications Prior to Visit  Medication Sig Dispense Refill  . diphenhydrAMINE (BENADRYL) 25 MG tablet Take 50 mg by mouth every 8 (eight) hours as needed for allergies.     Marland Kitchen EPINEPHrine (EPI-PEN) 0.3 mg/0.3 mL DEVI Inject 0.3 mg into the muscle daily as needed (allergic reaction).     . fluticasone (FLONASE) 50 MCG/ACT nasal spray Place 2 sprays into both nostrils 2 (two) times daily as needed. Reported on 09/10/2015    . glipiZIDE (GLUCOTROL XL) 10 MG 24 hr tablet Take 1 tablet (10 mg total) by mouth daily with breakfast. 90 tablet 3  . glucose blood (ONE TOUCH ULTRA TEST) test strip CHECK BLOOD SUGAR ONCE DAILY AND AS NEEDED (DX. E11.9) 100 each 1  . losartan-hydrochlorothiazide (HYZAAR) 100-25 MG tablet Take 1 tablet by mouth daily. 90 tablet 3  . ONETOUCH DELICA LANCETS 27C MISC Check blood sugar once daily and as directed. Dx E11.9 100 each 1  . potassium chloride SA (K-DUR,KLOR-CON) 20 MEQ tablet Take 1 tablet (20 mEq total) by mouth daily. 90 tablet 3  . ticlopidine (TICLID) 250 MG tablet Take 1 tablet (250 mg total) by mouth 2 (two) times daily with a meal. 180 tablet 0   No facility-administered medications prior to visit.     PAST MEDICAL HISTORY: Past Medical History:  Diagnosis Date  . Allergy history, drug    Aspirin  . CAD (coronary artery disease) 04/08/11   non obst by cath  . Cervical stenosis of spine    With neck pain  . Colon polyps 2009  . Diabetes mellitus    type II  . GERD (gastroesophageal reflux disease)   . Hyperlipidemia    myalgias with Lipitor and Zetia  . Hypertension   . Migraine   . Skin cancer    hx of basal cell/ak's  . Stroke [facility]) August 2014  . TIA (transient ischemic attack)     PAST SURGICAL HISTORY: Past Surgical History:  Procedure Laterality Date  . ABD [place]  07/2003   Negative  . APPENDECTOMY    . BREAST EXCISIONAL  BIOPSY Left   . BREAST SURGERY  1992   breast biopsy  . CARDIAC CATHETERIZATION  04/07/2011   non obst CAD (Dr Burt Knack)  . COLONOSCOPY  12/2007   Adenomatous colon polyps  . CYSTOSCOPY W/ DECANNULATION  03/2000   Normal  . EYE SURGERY  2005   tear duct surgery  . LOOP RECORDER IMPLANT N/A 02/16/2013   Procedure: LOOP RECORDER IMPLANT;  Surgeon: Deboraha Sprang, MD;  Location: [facility] CATH LAB;  Service: Cardiovascular;  Laterality: N/A;  . NASAL SINUS SURGERY  01/2005  . STRESS CARDIOLITE  11/1999   Normal/ negative  . [place] DUCT SURGERY  2005  . TEE WITHOUT CARDIOVERSION N/A 02/16/2013   Procedure: TRANSESOPHAGEAL ECHOCARDIOGRAM (TEE);  Surgeon: Josue Hector,  MD;  Location: [facility] ENDOSCOPY;  Service: Cardiovascular;  Laterality: N/A;  . TUBAL LIGATION     BTL    FAMILY HISTORY: Family History  Problem Relation Age of Onset  . Lung cancer Brother   . Diabetes Brother   . Pancreatic cancer Brother   . Heart disease Mother   . Heart disease Father   . Brain cancer Other   . Skin cancer Daughter   . Diabetes Sister   . Colon cancer Neg Hx     SOCIAL HISTORY: Social History   Social History  . Marital status: Married    Spouse name: Eddie Dibbles  . Number of children: 3  . Years of education: 12   Occupational History  . Retired Retired   Social History Main Topics  . Smoking status: Never Smoker  . Smokeless tobacco: Never Used  . Alcohol use No  . Drug use: No  . Sexual activity: No   Other Topics Concern  . Not on file   Social History Narrative   3 children   Does not drink caffeinated beverages   Cares for SIL with dementia     PHYSICAL EXAM  Vitals:   10/29/16 1042  BP: (!) 145/71  Pulse: 61  Weight: 200 lb (90.7 kg)   Body mass index is 29.53 kg/m. General: well developed, well nourished elderly Caucasian lady, seated, in no evident distress, well-groomed  Head: head normocephalic and atraumatic.  Neck: supple with no carotid  bruits  Cardiovascular:  regular rate and rhythm, no murmurs  Musculoskeletal: no deformity  Skin: no rash/petichiae  Vascular: Normal pulses all extremities   Neurologic Exam  Mental Status: Awake and fully alert. Oriented to place and time. Recent and remote memory intact. Attention span, concentration and fund of knowledge appropriate. Mood and affect appropriate.  Cranial Nerves:  Pupils equal, briskly reactive to light. Extraocular movements full without nystagmus. Visual fields full to confrontation. Hearing intact. Facial sensation intact. Face, tongue, palate moves normally and symmetrically.  Motor: Normal bulk and tone. Normal strength in all tested extremity muscles. . Minimal weakness of right grip muscles only.  Sensory: intact to touch and pinprick and vibratory sensation In the upper and lower extremities.  Coordination: Rapid alternating movements normal in all extremities. Finger-to-nose and heel-to-shin performed accurately bilaterally.  Gait and Station: Arises from chair without difficulty. Stance is normal. Gait demonstrates normal stride length and balance . Able to heel, toe and unsteady with tandem walk. No assistive device  Reflexes: 1+ and symmetric. Toes downgoing.  DIAGNOSTIC DATA (LABS, IMAGING, TESTING) - I reviewed patient records, labs, notes, testing and imaging myself where available.  Lab Results  Component Value Date   WBC 5.9 08/27/2016   HGB 13.3 08/27/2016   HCT 39.5 08/27/2016   MCV 95.6 08/27/2016   PLT 222.0 08/27/2016      Component Value Date/Time   NA 140 08/27/2016 0948   NA 144 08/29/2011 2307   K 3.9 08/27/2016 0948   K 3.6 08/29/2011 2307   CL 105 08/27/2016 0948   CL 103 08/29/2011 2307   CO2 31 08/27/2016 0948   CO2 26 08/29/2011 2307   GLUCOSE 104 (H) 08/27/2016 0948   GLUCOSE 162 (H) 08/29/2011 2307   BUN 16 08/27/2016 0948   BUN 14 08/29/2011 2307   CREATININE 1.04 08/27/2016 0948   CREATININE 0.98 01/27/2014 1448   CALCIUM 9.1 08/27/2016  0948   CALCIUM 8.9 08/29/2011 2307   PROT 6.8 08/27/2016 0948  ALBUMIN 3.8 08/27/2016 0948   AST 16 08/27/2016 0948   ALT 18 08/27/2016 0948   ALKPHOS 88 08/27/2016 0948   BILITOT 0.4 08/27/2016 0948   GFRNONAA 49 (L) 07/05/2016 1429   GFRNONAA 56 (L) 08/29/2011 2307   GFRAA 57 (L) 07/05/2016 1429   GFRAA >60 08/29/2011 2307   Lab Results  Component Value Date   CHOL 197 08/27/2016   HDL 40.50 08/27/2016   LDLCALC 104 (H) 02/12/2016   LDLDIRECT 107.0 08/27/2016   TRIG 215.0 (H) 08/27/2016   CHOLHDL 5 08/27/2016   Lab Results  Component Value Date   HGBA1C 6.9 (H) 08/27/2016   Lab Results  Component Value Date   XAJOINOM76 720 08/27/2016   Lab Results  Component Value Date   TSH 4.14 08/27/2016      ASSESSMENT AND PLAN 25 year Caucasian lady with left middle cerebral artery branch infarcts in August 2014 secondary to severe proximal left M1 stenosis who has had recurrent episodes of right arm paresthesias with one episode in October 2014 with involuntary twisting and lack of control and shaking of the hand raising concern for simple partial seizure versus myoclonus. She was started on Keppra but did not tolerate it well due to hives and itching and was changed to gabapentin which also she had trouble tolerating due to sleepiness. She has done well of late without any episodes   PLANContinue Ticlidopine for secondary stroke prevention will ask  PCP to prescribe and patient is agreeable with this Maintain strict control of hypertension with blood pressure goal below 130/90, today's reading 145/71 diabetes with hemoglobin A1c goal below 6.5% continue diabetic meds Lipids with LDL cholesterol goal below 100 last was 43, total cholesterol below 200 last was 197.  Eat a healthy diet with plenty of whole grains, cereals, fruits and vegetables,  Exercise regularly by walking and maintain ideal body weight.  Will discharge from neurologic clinic REMEMBER Pneumonic FAST  which stands for  F stands  for face drooping and weakness etc. A stands for arms, weakness S stands for speech slurred  T stands for time to call [place], [facility], [facility], [facility] [address] [place], [place] [address] [phone]

## 2016-10-29 NOTE — Patient Instructions (Addendum)
Continue Ticlidopine for secondary stroke prevention will ask  PCP to prescribe Maintain strict control of hypertension with blood pressure goal below 130/90, today's reading 145/71 diabetes with hemoglobin A1c goal below 6.5% continue diabetic meds Lipids with LDL cholesterol goal below 100 last was 43, total cholesterol below 200 last was 197.  Eat a healthy diet with plenty of whole grains, cereals, fruits and vegetables,  Exercise regularly by walking and maintain ideal body weight.  Will discharge from neurologic clinic Stroke Prevention Some medical conditions and behaviors are associated with an increased chance of having a stroke. You may prevent a stroke by making healthy choices and managing medical conditions. How can I reduce my risk of having a stroke?  Stay physically active. Get at least 30 minutes of activity on most or all days.  Do not smoke. It may also be helpful to avoid exposure to secondhand smoke.  Limit alcohol use. Moderate alcohol use is considered to be:  No more than 2 drinks per day for men.  No more than 1 drink per day for nonpregnant women.  Eat healthy foods. This involves:  Eating 5 or more servings of fruits and vegetables a day.  Making dietary changes that address high blood pressure (hypertension), high cholesterol, diabetes, or obesity.  Manage your cholesterol levels.  Making food choices that are high in fiber and low in saturated fat, trans fat, and cholesterol may control cholesterol levels.  Take any prescribed medicines to control cholesterol as directed by your health care provider.  Manage your diabetes.  Controlling your carbohydrate and sugar intake is recommended to manage diabetes.  Take any prescribed medicines to control diabetes as directed by your health care provider.  Control your hypertension.  Making food choices that are low in salt (sodium), saturated fat, trans fat, and cholesterol is recommended to manage  hypertension.  Ask your health care provider if you need treatment to lower your blood pressure. Take any prescribed medicines to control hypertension as directed by your health care provider.  If you are 74-52 years of age, have your blood pressure checked every 3-5 years. If you are 60 years of age or older, have your blood pressure checked every year.  Maintain a healthy weight.  Reducing calorie intake and making food choices that are low in sodium, saturated fat, trans fat, and cholesterol are recommended to manage weight.  Stop drug abuse.  Avoid taking birth control pills.  Talk to your health care provider about the risks of taking birth control pills if you are over 51 years old, smoke, get migraines, or have ever had a blood clot.  Get evaluated for sleep disorders (sleep apnea).  Talk to your health care provider about getting a sleep evaluation if you snore a lot or have excessive sleepiness.  Take medicines only as directed by your health care provider.  For some people, aspirin or blood thinners (anticoagulants) are helpful in reducing the risk of forming abnormal blood clots that can lead to stroke. If you have the irregular heart rhythm of atrial fibrillation, you should be on a blood thinner unless there is a good reason you cannot take them.  Understand all your medicine instructions.  Make sure that other conditions (such as anemia or atherosclerosis) are addressed. Get help right away if:  You have sudden weakness or numbness of the face, arm, or leg, especially on one side of the body.  Your face or eyelid droops to one side.  You have sudden confusion.  You have trouble speaking (aphasia) or understanding.  You have sudden trouble seeing in one or both eyes.  You have sudden trouble walking.  You have dizziness.  You have a loss of balance or coordination.  You have a sudden, severe headache with no known cause.  You have new chest pain or an  irregular heartbeat. Any of these symptoms may represent a serious problem that is an emergency. Do not wait to see if the symptoms will go away. Get medical help at once. Call your local emergency services (911 in [place] Do not drive yourself to the hospital. This information is not intended to replace advice given to you by your health care provider. Make sure you discuss any questions you have with your health care provider. Document Released: 07/31/2004 Document Revised: 11/29/2015 Document Reviewed: 12/24/2012 Elsevier Interactive Patient Education  2017 [employer].

## 2016-10-29 NOTE — Telephone Encounter (Signed)
Spoke with patient and informed her that NP contacted Dr Thea Alken about her Ticlid prescription. Informed patient that Dr Glori Bickers will refill her Ticlid for her. Patient verbalized understanding, appreciation for call.

## 2016-11-01 NOTE — Progress Notes (Signed)
I agree with the above plan 

## 2016-11-05 DIAGNOSIS — D0461 Carcinoma in situ of skin of right upper limb, including shoulder: Secondary | ICD-10-CM | POA: Diagnosis not present

## 2016-11-05 DIAGNOSIS — D045 Carcinoma in situ of skin of trunk: Secondary | ICD-10-CM | POA: Diagnosis not present

## 2016-11-12 ENCOUNTER — Telehealth: Payer: Self-pay

## 2016-11-12 ENCOUNTER — Other Ambulatory Visit: Payer: Self-pay | Admitting: Family Medicine

## 2016-11-12 MED ORDER — TICLOPIDINE HCL 250 MG PO TABS: 250.0000 mg | ORAL_TABLET | Freq: Two times a day (BID) | ORAL | 3 refills | Status: DC

## 2016-11-12 NOTE — Telephone Encounter (Signed)
Pt said Dr Glori Bickers had spoken with Dr Leonie Man office and Dr Glori Bickers would refill ticlid. Ms Fake said has to fax rx for ticlid to [place]. Please advise. Mrs Gilland will wait for cb.

## 2016-11-12 NOTE — Telephone Encounter (Signed)
Printed to fax Reviewed her neuro note and she is up to date

## 2016-11-13 NOTE — Telephone Encounter (Signed)
Rx faxed and left voicemail requesting pt to call the office so I can advise her

## 2016-11-13 NOTE — Telephone Encounter (Signed)
Pt notified Rx sent and advise of Dr. Tower's comments  

## 2016-12-08 ENCOUNTER — Telehealth: Payer: Self-pay | Admitting: Neurology

## 2016-12-08 ENCOUNTER — Other Ambulatory Visit: Payer: Self-pay | Admitting: Neurology

## 2016-12-08 NOTE — Telephone Encounter (Signed)
I spoke to the patient today while she was accompanying her sister for her schedule office visit. Patient informed me that she had been getting Ticlid from [place] but she has been told that she may no longer be able to do so. I reviewed the patient's chart and noted that she had history of aspirin allergy and had not felt so good on Plavix prior to been switched to Ticlid. I recommend patient switch back to Plavix 75 mg daily when she runs out of her Ticlid. She voiced understanding.

## 2017-02-18 DIAGNOSIS — H2513 Age-related nuclear cataract, bilateral: Secondary | ICD-10-CM | POA: Diagnosis not present

## 2017-02-18 LAB — HM DIABETES EYE EXAM

## 2017-02-23 DIAGNOSIS — D0462 Carcinoma in situ of skin of left upper limb, including shoulder: Secondary | ICD-10-CM | POA: Diagnosis not present

## 2017-02-23 DIAGNOSIS — L570 Actinic keratosis: Secondary | ICD-10-CM | POA: Diagnosis not present

## 2017-02-23 DIAGNOSIS — D485 Neoplasm of uncertain behavior of skin: Secondary | ICD-10-CM | POA: Diagnosis not present

## 2017-02-23 DIAGNOSIS — Z85828 Personal history of other malignant neoplasm of skin: Secondary | ICD-10-CM | POA: Diagnosis not present

## 2017-02-23 DIAGNOSIS — D0461 Carcinoma in situ of skin of right upper limb, including shoulder: Secondary | ICD-10-CM | POA: Diagnosis not present

## 2017-02-25 ENCOUNTER — Encounter: Payer: Self-pay | Admitting: Gastroenterology

## 2017-03-01 ENCOUNTER — Telehealth: Payer: Self-pay | Admitting: Family Medicine

## 2017-03-01 DIAGNOSIS — I10 Essential (primary) hypertension: Secondary | ICD-10-CM

## 2017-03-01 DIAGNOSIS — E7800 Pure hypercholesterolemia, unspecified: Secondary | ICD-10-CM

## 2017-03-01 DIAGNOSIS — E1159 Type 2 diabetes mellitus with other circulatory complications: Secondary | ICD-10-CM

## 2017-03-01 NOTE — Telephone Encounter (Signed)
-----   Message from Ellamae Sia sent at 02/19/2017 11:08 AM EDT ----- Regarding: Lab orders for Monday, 8.27.18  Lab orders for a 6 month follow up appt

## 2017-03-02 ENCOUNTER — Other Ambulatory Visit (INDEPENDENT_AMBULATORY_CARE_PROVIDER_SITE_OTHER): Payer: PPO

## 2017-03-02 DIAGNOSIS — E1159 Type 2 diabetes mellitus with other circulatory complications: Secondary | ICD-10-CM | POA: Diagnosis not present

## 2017-03-02 DIAGNOSIS — I10 Essential (primary) hypertension: Secondary | ICD-10-CM | POA: Diagnosis not present

## 2017-03-02 DIAGNOSIS — E7800 Pure hypercholesterolemia, unspecified: Secondary | ICD-10-CM

## 2017-03-02 LAB — LIPID PANEL
Cholesterol: 189 mg/dL (ref 0–200)
HDL: 47.2 mg/dL (ref >39.00)
LDL Cholesterol: 108 mg/dL — ABNORMAL HIGH (ref 0–99)
NonHDL: 141.55
Total CHOL/HDL Ratio: 4
Triglycerides: 170 mg/dL — ABNORMAL HIGH (ref 0.0–149.0)
VLDL: 34 mg/dL (ref 0.0–40.0)

## 2017-03-02 LAB — COMPREHENSIVE METABOLIC PANEL
ALT: 13 U/L (ref 0–35)
AST: 13 U/L (ref 0–37)
Albumin: 3.8 g/dL (ref 3.5–5.2)
Alkaline Phosphatase: 66 U/L (ref 39–117)
BUN: 17 mg/dL (ref 6–23)
CO2: 31 mEq/L (ref 19–32)
Calcium: 9 mg/dL (ref 8.4–10.5)
Chloride: 105 mEq/L (ref 96–112)
Creatinine, Ser: 1.02 mg/dL (ref 0.40–1.20)
GFR: 55.5 mL/min — ABNORMAL LOW (ref >60.00)
Glucose, Bld: 143 mg/dL — ABNORMAL HIGH (ref 70–99)
Potassium: 3.9 mEq/L (ref 3.5–5.1)
Sodium: 139 mEq/L (ref 135–145)
Total Bilirubin: 0.4 mg/dL (ref 0.2–1.2)
Total Protein: 6.9 g/dL (ref 6.0–8.3)

## 2017-03-02 LAB — HEMOGLOBIN A1C: Hgb A1c MFr Bld: 6.6 % — ABNORMAL HIGH (ref 4.6–6.5)

## 2017-03-16 ENCOUNTER — Ambulatory Visit (INDEPENDENT_AMBULATORY_CARE_PROVIDER_SITE_OTHER): Payer: PPO | Admitting: Family Medicine

## 2017-03-16 ENCOUNTER — Encounter: Payer: Self-pay | Admitting: Family Medicine

## 2017-03-16 VITALS — BP 122/70 | HR 76 | Temp 98.1°F | Ht 68.0 in | Wt 202.8 lb

## 2017-03-16 DIAGNOSIS — E7800 Pure hypercholesterolemia, unspecified: Secondary | ICD-10-CM

## 2017-03-16 DIAGNOSIS — I10 Essential (primary) hypertension: Secondary | ICD-10-CM

## 2017-03-16 DIAGNOSIS — E1159 Type 2 diabetes mellitus with other circulatory complications: Secondary | ICD-10-CM

## 2017-03-16 DIAGNOSIS — I480 Paroxysmal atrial fibrillation: Secondary | ICD-10-CM | POA: Diagnosis not present

## 2017-03-16 NOTE — Progress Notes (Signed)
Subjective:    Patient ID: Margaret Vazquez, female    DOB: 06/24/1936, 81 y.o.   MRN: [id number]  HPI Here for f/u of chronic health problems   Doing well overall   Wt Readings from Last 3 Encounters:  03/16/17 202 lb 12 oz (92 kg)  10/29/16 200 lb (90.7 kg)  10/28/16 198 lb 5 oz (90 kg)  wt is up a few lb -has been in the [place] all [place]  Watching diet the best she can  30.83 kg/m  DM2 with vascular dz (hx of CVA) Lab Results  Component Value Date   HGBA1C 6.6 (H) 03/02/2017  diet is better than last time  No longer eats sweets / does not crave them  This is down from 6.9-improved  On arb for renal protection  Glipizide xl 10 Eye exam - just had it / diabetic exam was fine (but she needs cataract surgery in both eyes)-Dr Brasington    bp is stable today  No cp or palpitations or headaches or edema  No side effects to medicines  BP Readings from Last 3 Encounters:  03/16/17 122/70  10/29/16 (!) 145/71  10/28/16 130/66     Lab Results  Component Value Date   CREATININE 1.02 03/02/2017   BUN 17 03/02/2017   NA 139 03/02/2017   K 3.9 03/02/2017   CL 105 03/02/2017   CO2 31 03/02/2017   Lab Results  Component Value Date   ALT 13 03/02/2017   AST 13 03/02/2017   ALKPHOS 66 03/02/2017   BILITOT 0.4 03/02/2017   Drinks a lot of water  Cholesterol  Lab Results  Component Value Date   CHOL 189 03/02/2017   CHOL 197 08/27/2016   CHOL 179 02/12/2016   Lab Results  Component Value Date   HDL 47.20 03/02/2017   HDL 40.50 08/27/2016   HDL 46.00 02/12/2016   Lab Results  Component Value Date   LDLCALC 108 (H) 03/02/2017   LDLCALC 104 (H) 02/12/2016   LDLCALC 120 (H) 08/16/2015   Lab Results  Component Value Date   TRIG 170.0 (H) 03/02/2017   TRIG 215.0 (H) 08/27/2016   TRIG 147.0 02/12/2016   Lab Results  Component Value Date   CHOLHDL 4 03/02/2017   CHOLHDL 5 08/27/2016   CHOLHDL 4 02/12/2016   Lab Results  Component Value Date   LDLDIRECT  107.0 08/27/2016   LDLDIRECT 136.5 10/20/2011   LDLDIRECT 134.7 08/08/2008   unfortunately intol of statins  Eating less carbs - trig are down  Some fried foods    Does not eat red meat - allergic to it   On ticlid  Can no longer get it from [place] - cost 300$ Will have to change to warfarin when she runs out She will alert [place] when time to do this    Patient Active Problem List   Diagnosis Date Noted  . History of CVA (cerebrovascular accident) 10/29/2016  . Routine general medical examination at a health care facility 08/24/2016  . Paroxysmal atrial fibrillation ([place]) 04/07/2016  . Fall 08/13/2015  . Left ankle pain 08/13/2015  . Left knee pain 06/14/2015  . Obesity (BMI 30-39.9) 08/23/2014  . Type 2 diabetes mellitus with vascular disease ([place]) 01/12/2014  . TIA (transient ischemic attack) 01/11/2014  . CVA (cerebral infarction) 01/11/2014  . Pain of right thumb 11/14/2013  . Left hip pain 11/14/2013  . LOOP Recorder LINQ 06/07/2013  . Fatigue 06/07/2013  . Sinusitis, chronic 05/16/2013  .  Acute ischemic stroke ([place]) 02/15/2013  . Numbness and tingling of right arm 02/14/2013  . Encounter for Medicare annual wellness exam 10/27/2012  . Encopresis(307.7) 12/15/2011  . Post-menopausal 10/28/2011  . Hives 02/05/2011  . PERSONAL HX COLONIC POLYPS 09/04/2009  . B12 deficiency 03/06/2009  . UNSPECIFIED ANEMIA 02/13/2009  . ALLERGIC RHINITIS 02/14/2008  . BACK PAIN, LUMBAR 11/03/2007  . Hyperlipidemia 11/24/2006  . Essential hypertension 11/24/2006  . GERD 10/09/2006  . OVERACTIVE BLADDER 10/09/2006  . INCONTINENCE, URGE 10/09/2006  . SKIN CANCER, HX OF 10/09/2006   Past Medical History:  Diagnosis Date  . Allergy history, drug    Aspirin  . CAD (coronary artery disease) 04/08/11   non obst by cath  . Cervical stenosis of spine    With neck pain  . Colon polyps 2009  . Diabetes mellitus    type II  . GERD (gastroesophageal reflux disease)   . Hyperlipidemia      myalgias with Lipitor and Zetia  . Hypertension   . Migraine   . Skin cancer    hx of basal cell/ak's  . Stroke [facility]) August 2014  . TIA (transient ischemic attack)    Past Surgical History:  Procedure Laterality Date  . ABD [place]  07/2003   Negative  . APPENDECTOMY    . BREAST EXCISIONAL BIOPSY Left   . BREAST SURGERY  1992   breast biopsy  . CARDIAC CATHETERIZATION  04/07/2011   non obst CAD (Dr Burt Knack)  . COLONOSCOPY  12/2007   Adenomatous colon polyps  . CYSTOSCOPY W/ DECANNULATION  03/2000   Normal  . EYE SURGERY  2005   tear duct surgery  . LOOP RECORDER IMPLANT N/A 02/16/2013   Procedure: LOOP RECORDER IMPLANT;  Surgeon: Deboraha Sprang, MD;  Location: [facility] CATH LAB;  Service: Cardiovascular;  Laterality: N/A;  . NASAL SINUS SURGERY  01/2005  . STRESS CARDIOLITE  11/1999   Normal/ negative  . [place] DUCT SURGERY  2005  . TEE WITHOUT CARDIOVERSION N/A 02/16/2013   Procedure: TRANSESOPHAGEAL ECHOCARDIOGRAM (TEE);  Surgeon: Josue Hector, MD;  Location: [facility] ENDOSCOPY;  Service: Cardiovascular;  Laterality: N/A;  . TUBAL LIGATION     BTL   Social History  Substance Use Topics  . Smoking status: Never Smoker  . Smokeless tobacco: Never Used  . Alcohol use No   Family History  Problem Relation Age of Onset  . Lung cancer Brother   . Diabetes Brother   . Pancreatic cancer Brother   . Heart disease Mother   . Heart disease Father   . Brain cancer Other   . Skin cancer Daughter   . Diabetes Sister   . Colon cancer Neg Hx    Allergies  Allergen Reactions  . Bee Venom Hives, Shortness Of Breath and Swelling  . Nabumetone Anaphylaxis  . Amoxicillin-Pot Clavulanate Hives and Swelling    To lips.  . Aspirin Hives  . Atorvastatin Swelling     joint pain/swelling, inc liver tests  . Clopidogrel Bisulfate Hives  . Codeine Nausea And Vomiting  . Ezetimibe Other (See Comments)     fatigue  . Metformin And Related Other (See Comments)    Diarrhea   . Valsartan Other  (See Comments)     fatigue  . Other Hives    **Red Meat**  SOB   Current Outpatient Prescriptions on File Prior to Visit  Medication Sig Dispense Refill  . diphenhydrAMINE (BENADRYL) 25 MG tablet Take 50 mg by mouth every  8 (eight) hours as needed for allergies.     Marland Kitchen EPINEPHrine (EPI-PEN) 0.3 mg/0.3 mL DEVI Inject 0.3 mg into the muscle daily as needed (allergic reaction).     . fluticasone (FLONASE) 50 MCG/ACT nasal spray Place 2 sprays into both nostrils 2 (two) times daily as needed. Reported on 09/10/2015    . glipiZIDE (GLUCOTROL XL) 10 MG 24 hr tablet Take 1 tablet (10 mg total) by mouth daily with breakfast. 90 tablet 3  . glucose blood (ONE TOUCH ULTRA TEST) test strip CHECK BLOOD SUGAR ONCE DAILY AND AS NEEDED (DX. E11.9) 100 each 1  . losartan-hydrochlorothiazide (HYZAAR) 100-25 MG tablet Take 1 tablet by mouth daily. 90 tablet 3  . ONETOUCH DELICA LANCETS 99991111 MISC Check blood sugar once daily and as directed. Dx E11.9 100 each 1  . potassium chloride SA (K-DUR,KLOR-CON) 20 MEQ tablet Take 1 tablet (20 mEq total) by mouth daily. 90 tablet 3  . ticlopidine (TICLID) 250 MG tablet Take 1 tablet (250 mg total) by mouth 2 (two) times daily with a meal. 180 tablet 3  . [DISCONTINUED] levETIRAcetam (KEPPRA) 250 MG tablet Take 1 tablet (250 mg total) by mouth 2 (two) times daily. 60 tablet 0   No current facility-administered medications on file prior to visit.     Review of Systems  Constitutional: Negative for activity change, appetite change, fatigue, fever and unexpected weight change.  HENT: Negative for congestion, ear pain, rhinorrhea, sinus pressure and sore throat.   Eyes: Negative for pain, redness and visual disturbance.  Respiratory: Negative for cough, shortness of breath and wheezing.   Cardiovascular: Negative for chest pain and palpitations.  Gastrointestinal: Negative for abdominal pain, blood in stool, constipation and diarrhea.  Endocrine: Negative for polydipsia and  polyuria.  Genitourinary: Negative for dysuria, frequency and urgency.  Musculoskeletal: Negative for arthralgias, back pain and myalgias.  Skin: Negative for pallor and rash.  Allergic/Immunologic: Negative for environmental allergies.  Neurological: Negative for dizziness, syncope and headaches.  Hematological: Negative for adenopathy. Does not bruise/bleed easily.  Psychiatric/Behavioral: Negative for decreased concentration and dysphoric mood. The patient is not nervous/anxious.        Objective:   Physical Exam  Constitutional: She appears well-developed and well-nourished. No distress.  obese and well appearing   HENT:  Head: Normocephalic and atraumatic.  Mouth/Throat: Oropharynx is clear and moist.  Eyes: Pupils are equal, round, and reactive to light. Conjunctivae and EOM are normal.  Neck: Normal range of motion. Neck supple. No JVD present. Carotid bruit is not present. No thyromegaly present.  Cardiovascular: Normal rate, regular rhythm, normal heart sounds and intact distal pulses.  Exam reveals no gallop.   Pulmonary/Chest: Effort normal and breath sounds normal. No respiratory distress. She has no wheezes. She has no rales.  No crackles  Abdominal: Soft. Bowel sounds are normal. She exhibits no distension, no abdominal bruit and no mass. There is no tenderness.  Musculoskeletal: She exhibits no edema.  Lymphadenopathy:    She has no cervical adenopathy.  Neurological: She is alert. She has normal reflexes.  Skin: Skin is warm and dry. No rash noted.  Psychiatric: She has a normal mood and affect.          Assessment & Plan:   Problem List Items Addressed This Visit      Cardiovascular and Mediastinum   Essential hypertension    bp in fair control at this time  BP Readings from Last 1 Encounters:  03/16/17 122/70   No  changes needed Disc lifstyle change with low sodium diet and exercise  Labs rev       Paroxysmal atrial fibrillation (H[facility])    She is  currently on ticlid for anti coag but can no longer get it from [place] or afford it  Will have to change to warfarin when she runs out  Will update [place] a week before so we can set her up for coumadin clinic here       Type 2 diabetes mellitus with vascular disease ([place]) - Primary    Lab Results  Component Value Date   HGBA1C 6.6 (H) 03/02/2017   Improved with better diet  No change in medicines  Wt loss enc  Will send for her recent eye exam report Disc foot care        Other   Hyperlipidemia    Disc goals for lipids and reasons to control them Rev labs with pt Rev low sat fat diet in detail  Not at goal (LDL is 108 with goal of 70 or below)  But pt is intol to statins  Enc better diet

## 2017-03-16 NOTE — Patient Instructions (Addendum)
We will send for your last eye exam-please stop by check out   For cholesterol  Avoid red meat/ fried foods/ egg yolks/ fatty breakfast meats/ butter, cheese and high fat dairy/ and shellfish    For your diabetes-keep working on low sugar/carb diet and work on weight loss   Follow up in 6 mo for annual exam   When you are about 10-14 days away from running out of ticlid- let us know

## 2017-03-19 NOTE — Assessment & Plan Note (Signed)
Lab Results  Component Value Date   HGBA1C 6.6 (H) 03/02/2017   Improved with better diet  No change in medicines  Wt loss enc  Will send for her recent eye exam report Disc foot care

## 2017-03-19 NOTE — Assessment & Plan Note (Signed)
She is currently on ticlid for anti coag but can no longer get it from [place] or afford it  Will have to change to warfarin when she runs out  Will update [place] a week before so we can set her up for coumadin clinic here

## 2017-03-19 NOTE — Assessment & Plan Note (Signed)
Disc goals for lipids and reasons to control them Rev labs with pt Rev low sat fat diet in detail  Not at goal (LDL is 108 with goal of 70 or below)  But pt is intol to statins  Enc better diet

## 2017-03-19 NOTE — Assessment & Plan Note (Signed)
bp in fair control at this time  BP Readings from Last 1 Encounters:  03/16/17 122/70   No changes needed Disc lifstyle change with low sodium diet and exercise  Labs rev

## 2017-03-20 DIAGNOSIS — H2512 Age-related nuclear cataract, left eye: Secondary | ICD-10-CM | POA: Diagnosis not present

## 2017-03-23 DIAGNOSIS — D0461 Carcinoma in situ of skin of right upper limb, including shoulder: Secondary | ICD-10-CM | POA: Diagnosis not present

## 2017-03-23 DIAGNOSIS — D0462 Carcinoma in situ of skin of left upper limb, including shoulder: Secondary | ICD-10-CM | POA: Diagnosis not present

## 2017-03-25 ENCOUNTER — Encounter: Payer: Self-pay | Admitting: *Deleted

## 2017-03-26 NOTE — Discharge Instructions (Signed)

## 2017-04-01 ENCOUNTER — Ambulatory Visit: Payer: PPO | Admitting: Anesthesiology

## 2017-04-01 ENCOUNTER — Ambulatory Visit
Admission: RE | Admit: 2017-04-01 | Discharge: 2017-04-01 | Disposition: A | Discharge status: Home / Self Care | Payer: PPO | Source: Ambulatory Visit | Attending: Ophthalmology | Admitting: Ophthalmology

## 2017-04-01 ENCOUNTER — Encounter
Admission: RE | Disposition: A | Discharge status: Home / Self Care | Payer: Self-pay | Source: Ambulatory Visit | Attending: Ophthalmology

## 2017-04-01 DIAGNOSIS — I252 Old myocardial infarction: Secondary | ICD-10-CM | POA: Insufficient documentation

## 2017-04-01 DIAGNOSIS — W57XXXS Bitten or stung by nonvenomous insect and other nonvenomous arthropods, sequela: Secondary | ICD-10-CM | POA: Diagnosis not present

## 2017-04-01 DIAGNOSIS — Z885 Allergy status to narcotic agent status: Secondary | ICD-10-CM | POA: Insufficient documentation

## 2017-04-01 DIAGNOSIS — Z91018 Allergy to other foods: Secondary | ICD-10-CM | POA: Insufficient documentation

## 2017-04-01 DIAGNOSIS — Z880 Allergy status to penicillin: Secondary | ICD-10-CM | POA: Diagnosis not present

## 2017-04-01 DIAGNOSIS — Z8673 Personal history of transient ischemic attack (TIA), and cerebral infarction without residual deficits: Secondary | ICD-10-CM | POA: Insufficient documentation

## 2017-04-01 DIAGNOSIS — H2512 Age-related nuclear cataract, left eye: Secondary | ICD-10-CM | POA: Insufficient documentation

## 2017-04-01 DIAGNOSIS — E7800 Pure hypercholesterolemia, unspecified: Secondary | ICD-10-CM | POA: Insufficient documentation

## 2017-04-01 DIAGNOSIS — E119 Type 2 diabetes mellitus without complications: Secondary | ICD-10-CM | POA: Diagnosis not present

## 2017-04-01 DIAGNOSIS — Z886 Allergy status to analgesic agent status: Secondary | ICD-10-CM | POA: Insufficient documentation

## 2017-04-01 DIAGNOSIS — I10 Essential (primary) hypertension: Secondary | ICD-10-CM | POA: Diagnosis not present

## 2017-04-01 HISTORY — PX: CATARACT EXTRACTION W/PHACO: SHX586

## 2017-04-01 HISTORY — DX: Adverse effect of unspecified anesthetic, initial encounter: T41.45XA

## 2017-04-01 HISTORY — DX: Other complications of anesthesia, initial encounter: T88.59XA

## 2017-04-01 LAB — GLUCOSE, CAPILLARY
Glucose-Capillary: 135 mg/dL — ABNORMAL HIGH (ref 65–99)
Glucose-Capillary: 157 mg/dL — ABNORMAL HIGH (ref 65–99)

## 2017-04-01 SURGERY — PHACOEMULSIFICATION, CATARACT, WITH IOL INSERTION
Anesthesia: Monitor Anesthesia Care | Laterality: Left | Wound class: Clean

## 2017-04-01 MED ORDER — MIDAZOLAM HCL 2 MG/2ML IJ SOLN
INTRAMUSCULAR | Status: DC | PRN
  Administered 2017-04-01: 2 mg via INTRAVENOUS

## 2017-04-01 MED ORDER — MOXIFLOXACIN HCL 0.5 % OP SOLN
1.0000 [drp] | OPHTHALMIC | Status: DC | PRN
  Administered 2017-04-01 (×3): 1 [drp] via OPHTHALMIC

## 2017-04-01 MED ORDER — ACETAMINOPHEN 160 MG/5ML PO SOLN: 325.0000 mg | ORAL | Status: DC | PRN

## 2017-04-01 MED ORDER — LIDOCAINE HCL (PF) 2 % IJ SOLN: INTRAOCULAR | Status: DC | PRN

## 2017-04-01 MED ORDER — ACETAMINOPHEN 325 MG PO TABS: 325.0000 mg | ORAL_TABLET | ORAL | Status: DC | PRN

## 2017-04-01 MED ORDER — MOXIFLOXACIN HCL 0.5 % OP SOLN
OPHTHALMIC | Status: DC | PRN
  Administered 2017-04-01: 0.2 mL via OPHTHALMIC

## 2017-04-01 MED ORDER — FENTANYL CITRATE (PF) 100 MCG/2ML IJ SOLN
INTRAMUSCULAR | Status: DC | PRN
Start: 2017-04-01 — End: 2017-04-01
  Administered 2017-04-01: 50 ug via INTRAVENOUS

## 2017-04-01 MED ORDER — ONDANSETRON HCL 4 MG/2ML IJ SOLN: 4.0000 mg | Freq: Once | INTRAMUSCULAR | Status: DC | PRN

## 2017-04-01 MED ORDER — NA HYALUR & NA CHOND-NA HYALUR 0.4-0.35 ML IO KIT
PACK | INTRAOCULAR | Status: DC | PRN
  Administered 2017-04-01: 1 mL via INTRAOCULAR

## 2017-04-01 MED ORDER — ARMC OPHTHALMIC DILATING DROPS
1.0000 "application " | OPHTHALMIC | Status: DC | PRN
  Administered 2017-04-01 (×3): 1 via OPHTHALMIC

## 2017-04-01 MED ORDER — BRIMONIDINE TARTRATE-TIMOLOL 0.2-0.5 % OP SOLN
OPHTHALMIC | Status: DC | PRN
  Administered 2017-04-01: 1 [drp] via OPHTHALMIC

## 2017-04-01 SURGICAL SUPPLY — 27 items
CANNULA ANT/CHMB 27G (MISCELLANEOUS) ×1 IMPLANT
CANNULA ANT/CHMB 27GA (MISCELLANEOUS) ×2 IMPLANT
CARTRIDGE ABBOTT (MISCELLANEOUS) IMPLANT
GLOVE SURG LX 7.5 STRW (GLOVE) ×1
GLOVE SURG LX STRL 7.5 STRW (GLOVE) ×1 IMPLANT
GLOVE SURG TRIUMPH 8.0 PF LTX (GLOVE) ×2 IMPLANT
GOWN STRL REUS W/ TWL LRG LVL3 (GOWN DISPOSABLE) ×2 IMPLANT
GOWN STRL REUS W/TWL LRG LVL3 (GOWN DISPOSABLE) ×4
LENS IOL TECNIS ITEC 22.5 (Intraocular Lens) ×2 IMPLANT
MARKER SKIN DUAL TIP RULER LAB (MISCELLANEOUS) ×2 IMPLANT
NDL FILTER BLUNT 18X1 1/2 (NEEDLE) ×1 IMPLANT
NDL RETROBULBAR .5 NSTRL (NEEDLE) IMPLANT
NEEDLE FILTER BLUNT 18X 1/2SAF (NEEDLE) ×1
NEEDLE FILTER BLUNT 18X1 1/2 (NEEDLE) ×1 IMPLANT
PACK CATARACT BRASINGTON (MISCELLANEOUS) ×2 IMPLANT
PACK EYE AFTER SURG (MISCELLANEOUS) ×2 IMPLANT
PACK OPTHALMIC (MISCELLANEOUS) ×2 IMPLANT
RING MALYGIN 7.0 (MISCELLANEOUS) IMPLANT
SUT ETHILON 10-0 CS-B-6CS-B-6 (SUTURE)
SUT VICRYL  9 0 (SUTURE)
SUT VICRYL 9 0 (SUTURE) IMPLANT
SUTURE EHLN 10-0 CS-B-6CS-B-6 (SUTURE) IMPLANT
SYR 3ML LL SCALE MARK (SYRINGE) ×2 IMPLANT
SYR 5ML LL (SYRINGE) ×2 IMPLANT
SYR TB 1ML LUER SLIP (SYRINGE) ×2 IMPLANT
WATER STERILE IRR 250ML POUR (IV SOLUTION) ×2 IMPLANT
WIPE NON LINTING 3.25X3.25 (MISCELLANEOUS) ×2 IMPLANT

## 2017-04-01 NOTE — Anesthesia Postprocedure Evaluation (Signed)
Anesthesia Post Note  Patient: Margaret Vazquez  Procedure(s) Performed: Procedure(s) (LRB): CATARACT EXTRACTION PHACO AND INTRAOCULAR LENS PLACEMENT (IOC) LEFT DIABETIC (Left)  Patient location during evaluation: PACU Anesthesia Type: MAC Level of consciousness: awake and alert Pain management: pain level controlled Vital Signs Assessment: post-procedure vital signs reviewed and stable Respiratory status: spontaneous breathing, nonlabored ventilation, respiratory function stable and patient connected to nasal cannula oxygen Cardiovascular status: stable and blood pressure returned to baseline Postop Assessment: no apparent nausea or vomiting Anesthetic complications: no    Carnie Bruemmer

## 2017-04-01 NOTE — H&P (Signed)
The History and Physical notes are on paper, have been signed, and are to be scanned. The patient remains stable and unchanged from the H&P.   Previous H&P reviewed, patient examined, and there are no changes.  Crystalle Popwell 04/01/2017 8:45 AM

## 2017-04-01 NOTE — Transfer of Care (Signed)
Immediate Anesthesia Transfer of Care Note  Patient: Margaret Vazquez  Procedure(s) Performed: Procedure(s) with comments: CATARACT EXTRACTION PHACO AND INTRAOCULAR LENS PLACEMENT (IOC) LEFT DIABETIC (Left) - Diabetic - oral meds  Patient Location: PACU  Anesthesia Type: MAC  Level of Consciousness: awake, alert  and patient cooperative  Airway and Oxygen Therapy: Patient Spontanous Breathing and Patient connected to supplemental oxygen  Post-op Assessment: Post-op Vital signs reviewed, Patient's Cardiovascular Status Stable, Respiratory Function Stable, Patent Airway and No signs of Nausea or vomiting  Post-op Vital Signs: Reviewed and stable  Complications: No apparent anesthesia complications

## 2017-04-01 NOTE — Anesthesia Preprocedure Evaluation (Signed)
Anesthesia Evaluation  Patient identified by MRN, date of birth, ID band  Reviewed: NPO status   History of Anesthesia Complications Negative for: history of anesthetic complications  Airway Mallampati: II  TM Distance: >3 FB Neck ROM: full    Dental no notable dental hx.    Pulmonary neg pulmonary ROS,    Pulmonary exam normal        Cardiovascular Exercise Tolerance: Good hypertension, + CAD (non obstructive)  Normal cardiovascular exam     Neuro/Psych  Headaches, TIA (2015)negative neurological ROS  negative psych ROS   GI/Hepatic Neg liver ROS, GERD  Controlled,  Endo/Other  diabetes  Renal/GU negative Renal ROS  negative genitourinary   Musculoskeletal   Abdominal   Peds  Hematology negative hematology ROS (+)   Anesthesia Other Findings stress: 2016: wnl;  echo: 2015: ef=60%;  ekg; nsr with pvcs;   loop recorder implanted     cards stable: 10/2016: dr. Caryl Comes;   Reproductive/Obstetrics                             Anesthesia Physical Anesthesia Plan  ASA: III  Anesthesia Plan: MAC   Post-op Pain Management:    Induction:   PONV Risk Score and Plan:   Airway Management Planned:   Additional Equipment:   Intra-op Plan:   Post-operative Plan:   Informed Consent: I have reviewed the patients History and Physical, chart, labs and discussed the procedure including the risks, benefits and alternatives for the proposed anesthesia with the patient or authorized representative who has indicated his/her understanding and acceptance.     Plan Discussed with: CRNA  Anesthesia Plan Comments:         Anesthesia Quick Evaluation

## 2017-04-01 NOTE — Anesthesia Procedure Notes (Signed)
Procedure Name: MAC Performed by: Tiwatope Emmitt Pre-anesthesia Checklist: Patient identified, Emergency Drugs available, Suction available, Timeout performed and Patient being monitored Patient Re-evaluated:Patient Re-evaluated prior to inductionOxygen Delivery Method: Nasal cannula Placement Confirmation: positive ETCO2     

## 2017-04-01 NOTE — Op Note (Signed)
OPERATIVE NOTE  Margaret Vazquez [id number] 04/01/2017   PREOPERATIVE DIAGNOSIS:  Nuclear sclerotic cataract left eye. H25.12   POSTOPERATIVE DIAGNOSIS:    Nuclear sclerotic cataract left eye.     PROCEDURE:  Phacoemusification with posterior chamber intraocular lens placement of the left eye   LENS:   Implant Name Type Inv. Item Serial No. Manufacturer Lot No. LRB No. Used  LENS IOL DIOP 22.5 - [id number] Intraocular Lens LENS IOL DIOP 22.5 [id number]   Left 1        ULTRASOUND TIME: 23  % of 1 minutes 34 seconds, CDE 21.4  SURGEON:  Wyonia Hough, MD   ANESTHESIA:  Topical with tetracaine drops and 2% Xylocaine jelly, augmented with 1% preservative-free intracameral lidocaine.    COMPLICATIONS:  None.   DESCRIPTION OF PROCEDURE:  The patient was identified in the holding room and transported to the operating room and placed in the supine position under the operating microscope.  The left eye was identified as the operative eye and it was prepped and draped in the usual sterile ophthalmic fashion.   A 1 millimeter clear-corneal paracentesis was made at the 1:30 position.  0.5 ml of preservative-free 1% lidocaine was injected into the anterior chamber.  The anterior chamber was filled with Viscoat viscoelastic.  A 2.4 millimeter keratome was used to make a near-clear corneal incision at the 10:30 position.  .  A curvilinear capsulorrhexis was made with a cystotome and capsulorrhexis forceps.  Balanced salt solution was used to hydrodissect and hydrodelineate the nucleus.   Phacoemulsification was then used in stop and chop fashion to remove the lens nucleus and epinucleus.  The remaining cortex was then removed using the irrigation and aspiration handpiece. Provisc was then placed into the capsular bag to distend it for lens placement.  A lens was then injected into the capsular bag.  The remaining viscoelastic was aspirated.   Wounds were hydrated with balanced salt  solution.  The anterior chamber was inflated to a physiologic pressure with balanced salt solution.  No wound leaks were noted. Vigamox 0.2 ml of a 1mg  per ml solution was injected into the anterior chamber for a dose of 0.2 mg of intracameral antibiotic at the completion of the case.   Timolol and Brimonidine drops were applied to the eye.  The patient was taken to the recovery room in stable condition without complications of anesthesia or surgery.  [name] 04/01/2017, 9:32 AM

## 2017-04-02 ENCOUNTER — Encounter: Payer: Self-pay | Admitting: Ophthalmology

## 2017-04-17 DIAGNOSIS — H2511 Age-related nuclear cataract, right eye: Secondary | ICD-10-CM | POA: Diagnosis not present

## 2017-04-21 ENCOUNTER — Encounter: Payer: Self-pay | Admitting: *Deleted

## 2017-04-27 NOTE — Discharge Instructions (Signed)

## 2017-04-29 ENCOUNTER — Encounter
Admission: RE | Disposition: A | Discharge status: Home / Self Care | Payer: Self-pay | Source: Ambulatory Visit | Attending: Ophthalmology

## 2017-04-29 ENCOUNTER — Ambulatory Visit
Admission: RE | Admit: 2017-04-29 | Discharge: 2017-04-29 | Disposition: A | Discharge status: Home / Self Care | Payer: PPO | Source: Ambulatory Visit | Attending: Ophthalmology | Admitting: Ophthalmology

## 2017-04-29 ENCOUNTER — Ambulatory Visit: Payer: PPO | Admitting: Anesthesiology

## 2017-04-29 DIAGNOSIS — E119 Type 2 diabetes mellitus without complications: Secondary | ICD-10-CM | POA: Diagnosis not present

## 2017-04-29 DIAGNOSIS — K219 Gastro-esophageal reflux disease without esophagitis: Secondary | ICD-10-CM | POA: Diagnosis not present

## 2017-04-29 DIAGNOSIS — H2511 Age-related nuclear cataract, right eye: Secondary | ICD-10-CM | POA: Insufficient documentation

## 2017-04-29 DIAGNOSIS — Z8673 Personal history of transient ischemic attack (TIA), and cerebral infarction without residual deficits: Secondary | ICD-10-CM | POA: Insufficient documentation

## 2017-04-29 DIAGNOSIS — I10 Essential (primary) hypertension: Secondary | ICD-10-CM | POA: Diagnosis not present

## 2017-04-29 DIAGNOSIS — I2510 Atherosclerotic heart disease of native coronary artery without angina pectoris: Secondary | ICD-10-CM | POA: Insufficient documentation

## 2017-04-29 HISTORY — PX: CATARACT EXTRACTION W/PHACO: SHX586

## 2017-04-29 LAB — GLUCOSE, CAPILLARY
Glucose-Capillary: 125 mg/dL — ABNORMAL HIGH (ref 65–99)
Glucose-Capillary: 135 mg/dL — ABNORMAL HIGH (ref 65–99)

## 2017-04-29 SURGERY — PHACOEMULSIFICATION, CATARACT, WITH IOL INSERTION
Anesthesia: Monitor Anesthesia Care | Laterality: Right | Wound class: Clean

## 2017-04-29 MED ORDER — MOXIFLOXACIN HCL 0.5 % OP SOLN
OPHTHALMIC | Status: DC | PRN
  Administered 2017-04-29: 0.2 mL via OPHTHALMIC

## 2017-04-29 MED ORDER — MOXIFLOXACIN HCL 0.5 % OP SOLN
1.0000 [drp] | OPHTHALMIC | Status: DC | PRN
  Administered 2017-04-29 (×3): 1 [drp] via OPHTHALMIC

## 2017-04-29 MED ORDER — BRIMONIDINE TARTRATE-TIMOLOL 0.2-0.5 % OP SOLN
OPHTHALMIC | Status: DC | PRN
  Administered 2017-04-29: 1 [drp] via OPHTHALMIC

## 2017-04-29 MED ORDER — ACETAMINOPHEN 160 MG/5ML PO SOLN: 325.0000 mg | Freq: Once | ORAL | Status: DC

## 2017-04-29 MED ORDER — NA HYALUR & NA CHOND-NA HYALUR 0.4-0.35 ML IO KIT
PACK | INTRAOCULAR | Status: DC | PRN
  Administered 2017-04-29: 1 mL via INTRAOCULAR

## 2017-04-29 MED ORDER — ACETAMINOPHEN 325 MG PO TABS: 325.0000 mg | ORAL_TABLET | Freq: Once | ORAL | Status: DC

## 2017-04-29 MED ORDER — ARMC OPHTHALMIC DILATING DROPS
1.0000 "application " | OPHTHALMIC | Status: DC | PRN
  Administered 2017-04-29 (×3): 1 via OPHTHALMIC

## 2017-04-29 MED ORDER — MIDAZOLAM HCL 2 MG/2ML IJ SOLN
INTRAMUSCULAR | Status: DC | PRN
  Administered 2017-04-29: 2 mg via INTRAVENOUS

## 2017-04-29 MED ORDER — FENTANYL CITRATE (PF) 100 MCG/2ML IJ SOLN
INTRAMUSCULAR | Status: DC | PRN
  Administered 2017-04-29: 50 ug via INTRAVENOUS

## 2017-04-29 MED ORDER — LIDOCAINE HCL (PF) 2 % IJ SOLN
INTRAOCULAR | Status: DC | PRN
  Administered 2017-04-29: 1 mL

## 2017-04-29 MED ORDER — EPINEPHRINE PF 1 MG/ML IJ SOLN
INTRAOCULAR | Status: DC | PRN
  Administered 2017-04-29: 74 mL via OPHTHALMIC

## 2017-04-29 MED ORDER — LACTATED RINGERS IV SOLN: INTRAVENOUS | Status: DC

## 2017-04-29 SURGICAL SUPPLY — 26 items
CANNULA ANT/CHMB 27G (MISCELLANEOUS) ×1 IMPLANT
CANNULA ANT/CHMB 27GA (MISCELLANEOUS) ×2 IMPLANT
CARTRIDGE ABBOTT (MISCELLANEOUS) IMPLANT
GLOVE SURG LX 7.5 STRW (GLOVE) ×1
GLOVE SURG LX STRL 7.5 STRW (GLOVE) ×1 IMPLANT
GLOVE SURG TRIUMPH 8.0 PF LTX (GLOVE) ×2 IMPLANT
GOWN STRL REUS W/ TWL LRG LVL3 (GOWN DISPOSABLE) ×2 IMPLANT
GOWN STRL REUS W/TWL LRG LVL3 (GOWN DISPOSABLE) ×4
LENS IOL TECNIS ITEC 22.0 (Intraocular Lens) ×1 IMPLANT
MARKER SKIN DUAL TIP RULER LAB (MISCELLANEOUS) ×2 IMPLANT
NDL RETROBULBAR .5 NSTRL (NEEDLE) IMPLANT
NEEDLE FILTER BLUNT 18X 1/2SAF (NEEDLE) ×1
NEEDLE FILTER BLUNT 18X1 1/2 (NEEDLE) ×1 IMPLANT
PACK CATARACT BRASINGTON (MISCELLANEOUS) ×2 IMPLANT
PACK EYE AFTER SURG (MISCELLANEOUS) ×2 IMPLANT
PACK OPTHALMIC (MISCELLANEOUS) ×2 IMPLANT
RING MALYGIN 7.0 (MISCELLANEOUS) IMPLANT
SUT ETHILON 10-0 CS-B-6CS-B-6 (SUTURE)
SUT VICRYL  9 0 (SUTURE)
SUT VICRYL 9 0 (SUTURE) IMPLANT
SUTURE EHLN 10-0 CS-B-6CS-B-6 (SUTURE) IMPLANT
SYR 3ML LL SCALE MARK (SYRINGE) ×2 IMPLANT
SYR 5ML LL (SYRINGE) ×2 IMPLANT
SYR TB 1ML LUER SLIP (SYRINGE) ×2 IMPLANT
WATER STERILE IRR 250ML POUR (IV SOLUTION) ×2 IMPLANT
WIPE NON LINTING 3.25X3.25 (MISCELLANEOUS) ×2 IMPLANT

## 2017-04-29 NOTE — Anesthesia Procedure Notes (Signed)
Procedure Name: MAC Performed by: Kamoni Depree Pre-anesthesia Checklist: Patient identified, Emergency Drugs available, Suction available, Timeout performed and Patient being monitored Patient Re-evaluated:Patient Re-evaluated prior to inductionOxygen Delivery Method: Nasal cannula Placement Confirmation: positive ETCO2       

## 2017-04-29 NOTE — Op Note (Signed)
LOCATION:  [place]   PREOPERATIVE DIAGNOSIS:    Nuclear sclerotic cataract right eye. H25.11   POSTOPERATIVE DIAGNOSIS:  Nuclear sclerotic cataract right eye.     PROCEDURE:  Phacoemusification with posterior chamber intraocular lens placement of the right eye   LENS:   Implant Name Type Inv. Item Serial No. Manufacturer Lot No. LRB No. Used  LENS IOL DIOP 22.0 - [id number] Intraocular Lens LENS IOL DIOP 22.0 [id number] AMO   Right 1        ULTRASOUND TIME: 16 % of 1 minutes, 39 seconds.  CDE 15.4   SURGEON:  Wyonia Hough, MD   ANESTHESIA:  Topical with tetracaine drops and 2% Xylocaine jelly, augmented with 1% preservative-free intracameral lidocaine.    COMPLICATIONS:  None.   DESCRIPTION OF PROCEDURE:  The patient was identified in the holding room and transported to the operating room and placed in the supine position under the operating microscope.  The right eye was identified as the operative eye and it was prepped and draped in the usual sterile ophthalmic fashion.   A 1 millimeter clear-corneal paracentesis was made at the 12:00 position.  0.5 ml of preservative-free 1% lidocaine was injected into the anterior chamber. The anterior chamber was filled with Viscoat viscoelastic.  A 2.4 millimeter keratome was used to make a near-clear corneal incision at the 9:00 position.  A curvilinear capsulorrhexis was made with a cystotome and capsulorrhexis forceps.  Balanced salt solution was used to hydrodissect and hydrodelineate the nucleus.   Phacoemulsification was then used in stop and chop fashion to remove the lens nucleus and epinucleus.  The remaining cortex was then removed using the irrigation and aspiration handpiece. Provisc was then placed into the capsular bag to distend it for lens placement.  A lens was then injected into the capsular bag.  The remaining viscoelastic was aspirated.   Wounds were hydrated with balanced salt solution.  The anterior  chamber was inflated to a physiologic pressure with balanced salt solution.  No wound leaks were noted. Vigamox 0.2 ml of a 1mg  per ml solution was injected into the anterior chamber for a dose of 0.2 mg of intracameral antibiotic at the completion of the case.   Timolol and Brimonidine drops were applied to the eye.  The patient was taken to the recovery room in stable condition without complications of anesthesia or surgery.   Staci Dack 04/29/2017, 8:41 AM

## 2017-04-29 NOTE — Transfer of Care (Signed)
Immediate Anesthesia Transfer of Care Note  Patient: Margaret Vazquez  Procedure(s) Performed: CATARACT EXTRACTION PHACO AND INTRAOCULAR LENS PLACEMENT (IOC) RIGHT DIABETIC (Right )  Patient Location: PACU  Anesthesia Type: MAC  Level of Consciousness: awake, alert  and patient cooperative  Airway and Oxygen Therapy: Patient Spontanous Breathing and Patient connected to supplemental oxygen  Post-op Assessment: Post-op Vital signs reviewed, Patient's Cardiovascular Status Stable, Respiratory Function Stable, Patent Airway and No signs of Nausea or vomiting  Post-op Vital Signs: Reviewed and stable  Complications: No apparent anesthesia complications

## 2017-04-29 NOTE — Anesthesia Preprocedure Evaluation (Signed)
Anesthesia Evaluation  Patient identified by MRN, date of birth, ID band Patient awake    Reviewed: Allergy & Precautions, H&P , NPO status , Patient's Chart, lab work & pertinent test results  History of Anesthesia Complications Negative for: history of anesthetic complications  Airway Mallampati: II  TM Distance: >3 FB Neck ROM: full    Dental no notable dental hx.    Pulmonary neg pulmonary ROS,    Pulmonary exam normal breath sounds clear to auscultation       Cardiovascular Exercise Tolerance: Good hypertension, + CAD (non obstructive)  Normal cardiovascular exam Rhythm:regular Rate:Normal     Neuro/Psych  Headaches, TIA (2015)negative neurological ROS  negative psych ROS   GI/Hepatic Neg liver ROS, GERD  Controlled,  Endo/Other  diabetes  Renal/GU negative Renal ROS  negative genitourinary   Musculoskeletal   Abdominal   Peds  Hematology negative hematology ROS (+)   Anesthesia Other Findings stress: 2016: wnl;  echo: 2015: ef=60%;  ekg; nsr with pvcs;   loop recorder implanted     cards stable: 10/2016: dr. Caryl Comes;   Reproductive/Obstetrics                             Anesthesia Physical  Anesthesia Plan  ASA: II  Anesthesia Plan: MAC   Post-op Pain Management:    Induction:   PONV Risk Score and Plan: 2 and Midazolam  Airway Management Planned:   Additional Equipment:   Intra-op Plan:   Post-operative Plan:   Informed Consent: I have reviewed the patients History and Physical, chart, labs and discussed the procedure including the risks, benefits and alternatives for the proposed anesthesia with the patient or authorized representative who has indicated his/her understanding and acceptance.     Plan Discussed with: CRNA  Anesthesia Plan Comments:         Anesthesia Quick Evaluation

## 2017-04-29 NOTE — H&P (Signed)
The History and Physical notes are on paper, have been signed, and are to be scanned. The patient remains stable and unchanged from the H&P.   Previous H&P reviewed, patient examined, and there are no changes.  Antrice Pal 04/29/2017 7:42 AM

## 2017-04-29 NOTE — Anesthesia Postprocedure Evaluation (Signed)
Anesthesia Post Note  Patient: Margaret Vazquez  Procedure(s) Performed: CATARACT EXTRACTION PHACO AND INTRAOCULAR LENS PLACEMENT (IOC) RIGHT DIABETIC (Right )  Patient location during evaluation: PACU Anesthesia Type: MAC Level of consciousness: awake and alert and oriented Pain management: satisfactory to patient Vital Signs Assessment: post-procedure vital signs reviewed and stable Respiratory status: spontaneous breathing, nonlabored ventilation and respiratory function stable Cardiovascular status: blood pressure returned to baseline and stable Postop Assessment: Adequate PO intake and No signs of nausea or vomiting Anesthetic complications: no    Raliegh Ip

## 2017-04-30 ENCOUNTER — Encounter: Payer: Self-pay | Admitting: Ophthalmology

## 2017-05-20 ENCOUNTER — Telehealth: Payer: Self-pay | Admitting: Family Medicine

## 2017-05-20 NOTE — Telephone Encounter (Signed)
She will have to call her pharmacy to see  If so - please call in new supply thanks

## 2017-05-20 NOTE — Telephone Encounter (Signed)
Copied from [place] [id number]. Topic: General - Other >> May 20, 2017  2:04 PM Yvette Rack wrote: Reason for CRM:  patient calling to see if her BP Medicine is one of the recalls losartan/HCTZ

## 2017-05-20 NOTE — Telephone Encounter (Signed)
Pt notified to check with pharmacy to see if med is part of recall and to let us know if it was

## 2017-06-01 ENCOUNTER — Emergency Department
Admission: EM | Admit: 2017-06-01 | Discharge: 2017-06-01 | Disposition: A | Discharge status: Home / Self Care | Payer: PPO | Attending: Emergency Medicine | Admitting: Emergency Medicine

## 2017-06-01 ENCOUNTER — Other Ambulatory Visit: Payer: Self-pay

## 2017-06-01 DIAGNOSIS — Z85828 Personal history of other malignant neoplasm of skin: Secondary | ICD-10-CM | POA: Insufficient documentation

## 2017-06-01 DIAGNOSIS — I10 Essential (primary) hypertension: Secondary | ICD-10-CM | POA: Diagnosis not present

## 2017-06-01 DIAGNOSIS — I2510 Atherosclerotic heart disease of native coronary artery without angina pectoris: Secondary | ICD-10-CM | POA: Insufficient documentation

## 2017-06-01 DIAGNOSIS — Z79899 Other long term (current) drug therapy: Secondary | ICD-10-CM | POA: Diagnosis not present

## 2017-06-01 DIAGNOSIS — T7840XA Allergy, unspecified, initial encounter: Secondary | ICD-10-CM | POA: Diagnosis not present

## 2017-06-01 DIAGNOSIS — L509 Urticaria, unspecified: Secondary | ICD-10-CM | POA: Diagnosis present

## 2017-06-01 DIAGNOSIS — E119 Type 2 diabetes mellitus without complications: Secondary | ICD-10-CM | POA: Diagnosis not present

## 2017-06-01 DIAGNOSIS — Z7984 Long term (current) use of oral hypoglycemic drugs: Secondary | ICD-10-CM | POA: Insufficient documentation

## 2017-06-01 MED ORDER — EPINEPHRINE 0.3 MG/0.3ML IJ SOAJ
0.3000 mg | Freq: Once | INTRAMUSCULAR | Status: DC
Start: 2017-06-01 — End: 2017-06-01

## 2017-06-01 MED ORDER — METHYLPREDNISOLONE SODIUM SUCC 125 MG IJ SOLR
125.0000 mg | Freq: Once | INTRAMUSCULAR | Status: AC
  Administered 2017-06-01: 125 mg via INTRAVENOUS
  Filled 2017-06-01: qty 2

## 2017-06-01 MED ORDER — EPINEPHRINE 0.3 MG/0.3ML IJ SOAJ: 0.3000 mg | Freq: Once | INTRAMUSCULAR | 0 refills | Status: AC

## 2017-06-01 MED ORDER — FAMOTIDINE IN NACL 20-0.9 MG/50ML-% IV SOLN
20.0000 mg | Freq: Once | INTRAVENOUS | Status: AC
  Administered 2017-06-01: 20 mg via INTRAVENOUS
  Filled 2017-06-01: qty 50

## 2017-06-01 MED ORDER — PREDNISONE 20 MG PO TABS: 60.0000 mg | ORAL_TABLET | Freq: Every day | ORAL | 0 refills | Status: AC

## 2017-06-01 NOTE — Discharge Instructions (Signed)
Return to the ER for new, worsening, recurrent hives, itching, difficulty breathing or swallowing, or any other new or worsening symptoms that concern you.  You should take the prednisone as prescribed for the next several days (starting tomorrow) and you may use Benadryl as needed for itching.

## 2017-06-01 NOTE — ED Notes (Signed)
Patient states she feels better, "shakiness" has improved. Patient still has hives, but are paler in color and not as many. Skin color is WNL.

## 2017-06-01 NOTE — ED Notes (Signed)
Patient was able to tolerate apple juice given to patient by Dr. Cherylann Banas well. Patient did spill it on the stretcher. Linens and gown were changed. Patient ambulated to the room commode with a steady gait.

## 2017-06-01 NOTE — ED Provider Notes (Signed)
[facility] Emergency Department Provider Note ____________________________________________   First MD Initiated Contact with Patient 06/01/17 1756     (approximate)  I have reviewed the triage vital signs and the nursing notes.   HISTORY  Chief Complaint Allergic Reaction    HPI Margaret Vazquez is a 81 y.o. female who presents with symptoms concerning for an allergic reaction, acute onset approximately 2 hours ago, not relieved by 3 Benadryl that she took on her own, and characterized by generalized itching, hives, and a feeling of a "lump in her throat."  Patient states that she has developed an allergy to red meat due to a tick bite, and had one previous similar allergic reaction last year after eating a steak.  She states she ate tacos that had beef in them by accident.  Patient denies difficulty breathing, chest pain or palpitations, wheezing, or vomiting.  She was in her usual state of health until this afternoon.  Past Medical History:  Diagnosis Date  . Allergy history, drug    Aspirin  . CAD (coronary artery disease) 04/08/11   non obst by cath  . Cervical stenosis of spine    With neck pain  . Colon polyps 2009  . Complication of anesthesia    slow to wake after anesthesia in 1980s  . Diabetes mellitus    type II  . GERD (gastroesophageal reflux disease)   . Hyperlipidemia    myalgias with Lipitor and Zetia  . Hypertension   . Migraine   . Skin cancer    hx of basal cell/ak's  . Stroke [facility]) August 2014  . TIA (transient ischemic attack)     Patient Active Problem List   Diagnosis Date Noted  . History of CVA (cerebrovascular accident) 10/29/2016  . Routine general medical examination at a health care facility 08/24/2016  . Paroxysmal atrial fibrillation ([place]) 04/07/2016  . Fall 08/13/2015  . Left ankle pain 08/13/2015  . Left knee pain 06/14/2015  . Obesity (BMI 30-39.9) 08/23/2014  . Type 2 diabetes mellitus with vascular disease  ([place]) 01/12/2014  . TIA (transient ischemic attack) 01/11/2014  . CVA (cerebral infarction) 01/11/2014  . Pain of right thumb 11/14/2013  . Left hip pain 11/14/2013  . LOOP Recorder LINQ 06/07/2013  . Fatigue 06/07/2013  . Sinusitis, chronic 05/16/2013  . H/O: CVA (cerebrovascular accident) 02/15/2013  . Numbness and tingling of right arm 02/14/2013  . Encounter for Medicare annual wellness exam 10/27/2012  . Encopresis(307.7) 12/15/2011  . Post-menopausal 10/28/2011  . Hives 02/05/2011  . PERSONAL HX COLONIC POLYPS 09/04/2009  . B12 deficiency 03/06/2009  . UNSPECIFIED ANEMIA 02/13/2009  . ALLERGIC RHINITIS 02/14/2008  . BACK PAIN, LUMBAR 11/03/2007  . Hyperlipidemia 11/24/2006  . Essential hypertension 11/24/2006  . GERD 10/09/2006  . OVERACTIVE BLADDER 10/09/2006  . INCONTINENCE, URGE 10/09/2006  . SKIN CANCER, HX OF 10/09/2006    Past Surgical History:  Procedure Laterality Date  . ABD [place]  07/2003   Negative  . APPENDECTOMY    . BREAST EXCISIONAL BIOPSY Left   . BREAST SURGERY  1992   breast biopsy  . CARDIAC CATHETERIZATION  04/07/2011   non obst CAD (Dr Burt Knack)  . CATARACT EXTRACTION W/PHACO Left 04/01/2017   Procedure: CATARACT EXTRACTION PHACO AND INTRAOCULAR LENS PLACEMENT ([place]) LEFT DIABETIC;  Surgeon: Leandrew Koyanagi, MD;  Location: [place];  Service: Ophthalmology;  Laterality: Left;  Diabetic - oral meds  . CATARACT EXTRACTION W/PHACO Right 04/29/2017   Procedure: CATARACT  EXTRACTION PHACO AND INTRAOCULAR LENS PLACEMENT ([place]) RIGHT DIABETIC;  Surgeon: Leandrew Koyanagi, MD;  Location: [place];  Service: Ophthalmology;  Laterality: Right;  Diabetic - oral meds  . COLONOSCOPY  12/2007   Adenomatous colon polyps  . CYSTOSCOPY W/ DECANNULATION  03/2000   Normal  . EYE SURGERY  2005   tear duct surgery  . LOOP RECORDER IMPLANT N/A 02/16/2013   Procedure: LOOP RECORDER IMPLANT;  Surgeon: Deboraha Sprang, MD;  Location: [facility] CATH LAB;   Service: Cardiovascular;  Laterality: N/A;  . NASAL SINUS SURGERY  01/2005  . STRESS CARDIOLITE  11/1999   Normal/ negative  . [place] DUCT SURGERY  2005  . TEE WITHOUT CARDIOVERSION N/A 02/16/2013   Procedure: TRANSESOPHAGEAL ECHOCARDIOGRAM (TEE);  Surgeon: Josue Hector, MD;  Location: [facility] ENDOSCOPY;  Service: Cardiovascular;  Laterality: N/A;  . TUBAL LIGATION     BTL    Prior to Admission medications   Medication Sig Start Date End Date Taking? Authorizing Provider  diphenhydrAMINE (BENADRYL) 25 MG tablet Take 50 mg by mouth every 8 (eight) hours as needed for allergies.     [provider]  EPINEPHrine (EPI-PEN) 0.3 mg/0.3 mL DEVI Inject 0.3 mg into the muscle daily as needed (allergic reaction).  12/20/10   Tower, [name], MD  fluticasone (FLONASE) 50 MCG/ACT nasal spray Place 2 sprays into both nostrils 2 (two) times daily as needed. Reported on 09/10/2015 01/20/14   [provider]  glipiZIDE (GLUCOTROL XL) 10 MG 24 hr tablet Take 1 tablet (10 mg total) by mouth daily with breakfast. 09/02/16   Tower, Roque Lias A, MD  glucose blood (ONE TOUCH ULTRA TEST) test strip CHECK BLOOD SUGAR ONCE DAILY AND AS NEEDED (DX. E11.9) 05/08/16   Tower, [name], MD  losartan-hydrochlorothiazide (HYZAAR) 100-25 MG tablet Take 1 tablet by mouth daily. 09/02/16   Tower, [name], MD  [facility] DELICA LANCETS 99991111 MISC Check blood sugar once daily and as directed. Dx E11.9 10/14/16   Tower, [name], MD  potassium chloride SA (K-DUR,KLOR-CON) 20 MEQ tablet Take 1 tablet (20 mEq total) by mouth daily. 09/02/16   Tower, [name], MD  ticlopidine (TICLID) 250 MG tablet Take 1 tablet (250 mg total) by mouth 2 (two) times daily with a meal. 11/12/16   Tower, [name], MD    Allergies Bee venom; Nabumetone; Amoxicillin-pot clavulanate; Aspirin; Atorvastatin; Clopidogrel bisulfate; Codeine; Ezetimibe; Metformin and related; Valsartan; and Other  Family History  Problem Relation Age of Onset  . Lung cancer Brother    . Diabetes Brother   . Pancreatic cancer Brother   . Heart disease Mother   . Heart disease Father   . Brain cancer Other   . Skin cancer Daughter   . Diabetes Sister   . Colon cancer Neg Hx     Social History Social History   Tobacco Use  . Smoking status: Never Smoker  . Smokeless tobacco: Never Used  Substance Use Topics  . Alcohol use: No    Alcohol/week: 0.0 oz  . Drug use: No    Review of Systems  Constitutional: No fever/chills Eyes: No redness. ENT: Positive for throat discomfort. Cardiovascular: Denies chest pain. Respiratory: Denies shortness of breath. Gastrointestinal: No nausea, no vomiting.   Genitourinary: Negative for dysuria.  Musculoskeletal: Negative for back pain. Skin: Positive for hives. Neurological: Negative for headache.    ____________________________________________   PHYSICAL EXAM:  VITAL SIGNS: ED Triage Vitals  Enc Vitals Group  BP 06/01/17 1747 (!) 160/96     Pulse Rate 06/01/17 1747 80     Resp 06/01/17 1747 18     Temp 06/01/17 1747 97.9 F (36.6 C)     Temp Source 06/01/17 1747 Oral     SpO2 06/01/17 1747 98 %     Weight 06/01/17 1746 200 lb (90.7 kg)     Height 06/01/17 1746 5' 9"$  (1.753 m)     Head Circumference --      Peak Flow --      Pain Score --      Pain Loc --      Pain Edu? --      Excl. in [place]? --     Constitutional: Alert and oriented.  Uncomfortable appearing but in no acute distress. Eyes: Conjunctivae are normal.  Head: Atraumatic. Nose: Mild congestion.  Mouth/Throat: Mucous membranes are moist.  Oropharynx clear with no visible swelling.  No pulled secretions.  No stridor. Neck: Normal range of motion.  Cardiovascular: Normal rate, regular rhythm. Grossly normal heart sounds.  Good peripheral circulation. Respiratory: Normal respiratory effort.  No retractions. Lungs CTAB. Gastrointestinal: No distention.  Musculoskeletal: No lower extremity edema.  Extremities warm and well perfused.    Neurologic:  Normal speech and language. No gross focal neurologic deficits are appreciated.  Skin:  Skin is warm and dry.  Scattered urticaria. Psychiatric: Mood and affect are normal. Speech and behavior are normal.  ____________________________________________   LABS (all labs ordered are listed, but only abnormal results are displayed)  Labs Reviewed - No data to display ____________________________________________  EKG   ____________________________________________  RADIOLOGY    ____________________________________________   PROCEDURES  Procedure(s) performed: No    Critical Care performed: No ____________________________________________   INITIAL IMPRESSION / ASSESSMENT AND PLAN / ED COURSE  Pertinent labs & imaging results that were available during my care of the patient were reviewed by me and considered in my medical decision making (see chart for details).  81 year old female with past medical history as noted above presents with an apparent allergic reaction after eating tacos with beef and them.  Patient reports generalized itching, shakiness, and a feeling of a lump in her throat.  It feels similar to prior allergic reaction.  She took 3 Benadryl at home and then came to the hospital.  Review of past medical records in epic is noncontributory.  On exam, patient is anxious and uncomfortable but not ill-appearing.  Her vital signs are normal, there is no evidence of airway compromise or respiratory involvement.  She does have some scattered urticaria.  Hesitation is consistent with allergic reaction.  Patient reports having a more severe allergic reaction and being given epi last year, after which she became very tachycardic and hypertensive and felt more uncomfortable for quite some time.  Given that she has no airway compromise, based on discussion with the patient I will give Solu-Medrol and Pepcid for now, and withhold epi unless her symptoms are  worsening.  ----------------------------------------- 7:48 PM on 06/01/2017 -----------------------------------------  On reassessment, patient states that she feels much better and the hives appear to have resolved.  She no longer discomfort.  No indication for epi at this time.  I will observe patient for a little while longer, but given that she did not receive epi there is no indication for prolonged observation.   ----------------------------------------- 8:11 PM on 06/01/2017 -----------------------------------------  Patient continues to feel well and would like to go home.  No recurrence of her symptoms.  I will discharge with prescription for new EpiPen, and for prednisone.  Return precautions given and patient expresses understanding.     ____________________________________________   FINAL CLINICAL IMPRESSION(S) / ED DIAGNOSES  Final diagnoses:  Allergic reaction, initial encounter      NEW MEDICATIONS STARTED DURING THIS VISIT:  This SmartLink is deprecated. Use AVSMEDLIST instead to display the medication list for a patient.   Note:  This document was prepared using Dragon voice recognition software and may include unintentional dictation errors.    Arta Silence, MD 06/01/17 2012

## 2017-06-01 NOTE — ED Triage Notes (Signed)
Pt reports is not supposed to eat red meat, has been told through allergy testing that she is allergic. Pt reports found this out approximately one year ago after eating a steak. Pt reports the allergy is related to a tick bite. Pt reports ate beef tacos today approximately 2:00pm today. Pt presents with itching all over, pt reports feeling that her nose is stopped up and has a feeling of a lump in her throat. Pt tearful in triage. Pt speaking in complete sentences without difficulty.

## 2017-06-08 DIAGNOSIS — Z961 Presence of intraocular lens: Secondary | ICD-10-CM | POA: Diagnosis not present

## 2017-08-30 ENCOUNTER — Telehealth: Payer: Self-pay | Admitting: Family Medicine

## 2017-08-30 DIAGNOSIS — E7800 Pure hypercholesterolemia, unspecified: Secondary | ICD-10-CM

## 2017-08-30 DIAGNOSIS — E538 Deficiency of other specified B group vitamins: Secondary | ICD-10-CM

## 2017-08-30 DIAGNOSIS — I10 Essential (primary) hypertension: Secondary | ICD-10-CM

## 2017-08-30 DIAGNOSIS — E1159 Type 2 diabetes mellitus with other circulatory complications: Secondary | ICD-10-CM

## 2017-08-30 NOTE — Telephone Encounter (Signed)
-----   Message from Eustace Pen, LPN sent at [phone]/69/7948  4:43 PM EST ----- Regarding: Labs 08/28/17 Lab orders needed. Thank you.  Insurance:  [employer]

## 2017-08-31 DIAGNOSIS — Z85828 Personal history of other malignant neoplasm of skin: Secondary | ICD-10-CM | POA: Diagnosis not present

## 2017-08-31 DIAGNOSIS — L570 Actinic keratosis: Secondary | ICD-10-CM | POA: Diagnosis not present

## 2017-08-31 DIAGNOSIS — D485 Neoplasm of uncertain behavior of skin: Secondary | ICD-10-CM | POA: Diagnosis not present

## 2017-08-31 DIAGNOSIS — L821 Other seborrheic keratosis: Secondary | ICD-10-CM | POA: Diagnosis not present

## 2017-09-01 ENCOUNTER — Ambulatory Visit (INDEPENDENT_AMBULATORY_CARE_PROVIDER_SITE_OTHER): Payer: PPO

## 2017-09-01 VITALS — BP 136/70 | HR 56 | Temp 97.7°F | Ht 68.0 in | Wt 199.0 lb

## 2017-09-01 DIAGNOSIS — C44622 Squamous cell carcinoma of skin of right upper limb, including shoulder: Secondary | ICD-10-CM | POA: Diagnosis not present

## 2017-09-01 DIAGNOSIS — E538 Deficiency of other specified B group vitamins: Secondary | ICD-10-CM | POA: Diagnosis not present

## 2017-09-01 DIAGNOSIS — E7800 Pure hypercholesterolemia, unspecified: Secondary | ICD-10-CM

## 2017-09-01 DIAGNOSIS — I10 Essential (primary) hypertension: Secondary | ICD-10-CM | POA: Diagnosis not present

## 2017-09-01 DIAGNOSIS — Z0000 Encounter for general adult medical examination without abnormal findings: Secondary | ICD-10-CM | POA: Diagnosis not present

## 2017-09-01 DIAGNOSIS — D0461 Carcinoma in situ of skin of right upper limb, including shoulder: Secondary | ICD-10-CM | POA: Diagnosis not present

## 2017-09-01 DIAGNOSIS — E1159 Type 2 diabetes mellitus with other circulatory complications: Secondary | ICD-10-CM

## 2017-09-01 LAB — LIPID PANEL
Cholesterol: 200 mg/dL (ref 0–200)
HDL: 48.8 mg/dL (ref >39.00)
LDL Cholesterol: 114 mg/dL — ABNORMAL HIGH (ref 0–99)
NonHDL: 151.39
Total CHOL/HDL Ratio: 4
Triglycerides: 188 mg/dL — ABNORMAL HIGH (ref 0.0–149.0)
VLDL: 37.6 mg/dL (ref 0.0–40.0)

## 2017-09-01 LAB — CBC WITH DIFFERENTIAL/PLATELET
Basophils Absolute: 0.1 10*3/uL (ref 0.0–0.1)
Basophils Relative: 1.1 % (ref 0.0–3.0)
Eosinophils Absolute: 0.1 10*3/uL (ref 0.0–0.7)
Eosinophils Relative: 1.8 % (ref 0.0–5.0)
HCT: 41 % (ref 36.0–46.0)
Hemoglobin: 13.7 g/dL (ref 12.0–15.0)
Lymphocytes Relative: 30.7 % (ref 12.0–46.0)
Lymphs Abs: 1.8 10*3/uL (ref 0.7–4.0)
MCHC: 33.5 g/dL (ref 30.0–36.0)
MCV: 96.9 fl (ref 78.0–100.0)
Monocytes Absolute: 0.4 10*3/uL (ref 0.1–1.0)
Monocytes Relative: 6.7 % (ref 3.0–12.0)
Neutro Abs: 3.5 10*3/uL (ref 1.4–7.7)
Neutrophils Relative %: 59.7 % (ref 43.0–77.0)
Platelets: 228 10*3/uL (ref 150.0–400.0)
RBC: 4.23 Mil/uL (ref 3.87–5.11)
RDW: 13.3 % (ref 11.5–15.5)
WBC: 5.8 10*3/uL (ref 4.0–10.5)

## 2017-09-01 LAB — COMPREHENSIVE METABOLIC PANEL
ALT: 13 U/L (ref 0–35)
AST: 13 U/L (ref 0–37)
Albumin: 3.7 g/dL (ref 3.5–5.2)
Alkaline Phosphatase: 83 U/L (ref 39–117)
BUN: 20 mg/dL (ref 6–23)
CO2: 31 mEq/L (ref 19–32)
Calcium: 9.3 mg/dL (ref 8.4–10.5)
Chloride: 102 mEq/L (ref 96–112)
Creatinine, Ser: 1.08 mg/dL (ref 0.40–1.20)
GFR: 51.89 mL/min — ABNORMAL LOW (ref >60.00)
Glucose, Bld: 164 mg/dL — ABNORMAL HIGH (ref 70–99)
Potassium: 4 mEq/L (ref 3.5–5.1)
Sodium: 138 mEq/L (ref 135–145)
Total Bilirubin: 0.4 mg/dL (ref 0.2–1.2)
Total Protein: 7.1 g/dL (ref 6.0–8.3)

## 2017-09-01 LAB — VITAMIN B12: Vitamin B-12: 380 pg/mL (ref 211–911)

## 2017-09-01 LAB — TSH: TSH: 4 u[IU]/mL (ref 0.35–4.50)

## 2017-09-01 LAB — HEMOGLOBIN A1C: Hgb A1c MFr Bld: 7.5 % — ABNORMAL HIGH (ref 4.6–6.5)

## 2017-09-01 NOTE — Progress Notes (Signed)
Subjective:   Margaret Vazquez is a 82 y.o. female who presents for Medicare Annual (Subsequent) preventive examination.  Review of Systems:  N/A Cardiac Risk Factors include: advanced age (>57men, >73 women);obesity (BMI >30kg/m2);diabetes mellitus;dyslipidemia;hypertension     Objective:     Vitals: BP 136/70 (BP Location: Right Arm, Patient Position: Sitting, Cuff Size: Normal)   Pulse (!) 56   Temp 97.7 F (36.5 C) (Oral)   Ht 5\' 8"  (1.727 m)   Wt 199 lb (90.3 kg)   SpO2 96%   BMI 30.26 kg/m   Body mass index is 30.26 kg/m.  Advanced Directives 09/01/2017 06/01/2017 04/29/2017 04/01/2017 08/27/2016 07/05/2016 11/01/2014  Does Patient Have a Medical Advance Directive? No No No No No No No  Would patient like information on creating a medical advance directive? No - Patient declined - No - Patient declined No - Patient declined - - No - patient declined information  Pre-existing out of facility DNR order (yellow form or pink MOST form) - - - - - - -    Tobacco Social History   Tobacco Use  Smoking Status Never Smoker  Smokeless Tobacco Never Used     Counseling given: No   Clinical Intake:  Pre-visit preparation completed: Yes  Pain : No/denies pain Pain Score: 0-No pain     Nutritional Status: BMI > 30  Obese Nutritional Risks: None Diabetes: Yes CBG done?: No Did pt. bring in CBG monitor from home?: No  How often do you need to have someone help you when you read instructions, pamphlets, or other written materials from your doctor or pharmacy?: 1 - Never What is the last grade level you completed in school?: 12th grade  Interpreter Needed?: No  Comments: pt lives with spouse Information entered by :: LPinson, LPN  Past Medical History:  Diagnosis Date  . Allergy history, drug    Aspirin  . CAD (coronary artery disease) 04/08/11   non obst by cath  . Cervical stenosis of spine    With neck pain  . Colon polyps 2009  . Complication of anesthesia      slow to wake after anesthesia in 1980s  . Diabetes mellitus    type II  . GERD (gastroesophageal reflux disease)   . Hyperlipidemia    myalgias with Lipitor and Zetia  . Hypertension   . Migraine   . Skin cancer    hx of basal cell/ak's  . Stroke [facility]) August 2014  . TIA (transient ischemic attack)    Past Surgical History:  Procedure Laterality Date  . ABD [place]  07/2003   Negative  . APPENDECTOMY    . BREAST EXCISIONAL BIOPSY Left   . BREAST SURGERY  1992   breast biopsy  . CARDIAC CATHETERIZATION  04/07/2011   non obst CAD (Dr Burt Knack)  . CATARACT EXTRACTION W/PHACO Left 04/01/2017   Procedure: CATARACT EXTRACTION PHACO AND INTRAOCULAR LENS PLACEMENT ([place]) LEFT DIABETIC;  Surgeon: Leandrew Koyanagi, MD;  Location: [facility];  Service: Ophthalmology;  Laterality: Left;  Diabetic - oral meds  . CATARACT EXTRACTION W/PHACO Right 04/29/2017   Procedure: CATARACT EXTRACTION PHACO AND INTRAOCULAR LENS PLACEMENT ([place]) RIGHT DIABETIC;  Surgeon: Leandrew Koyanagi, MD;  Location: [facility];  Service: Ophthalmology;  Laterality: Right;  Diabetic - oral meds  . COLONOSCOPY  12/2007   Adenomatous colon polyps  . CYSTOSCOPY W/ DECANNULATION  03/2000   Normal  . EYE SURGERY  2005   tear duct surgery  . LOOP  RECORDER IMPLANT N/A 02/16/2013   Procedure: LOOP RECORDER IMPLANT;  Surgeon: Deboraha Sprang, MD;  Location: [facility] CATH LAB;  Service: Cardiovascular;  Laterality: N/A;  . NASAL SINUS SURGERY  01/2005  . STRESS CARDIOLITE  11/1999   Normal/ negative  . [place] DUCT SURGERY  2005  . TEE WITHOUT CARDIOVERSION N/A 02/16/2013   Procedure: TRANSESOPHAGEAL ECHOCARDIOGRAM (TEE);  Surgeon: Josue Hector, MD;  Location: [facility] ENDOSCOPY;  Service: Cardiovascular;  Laterality: N/A;  . TUBAL LIGATION     BTL   Family History  Problem Relation Age of Onset  . Lung cancer Brother   . Diabetes Brother   . Pancreatic cancer Brother   . Heart disease Mother   . Heart disease  Father   . Brain cancer Other   . Skin cancer Daughter   . Diabetes Sister   . Colon cancer Neg Hx    Social History   Socioeconomic History  . Marital status: Married    Spouse name: Eddie Dibbles  . Number of children: 3  . Years of education: 40  . Highest education level: None  Social Needs  . Financial resource strain: None  . Food insecurity - worry: None  . Food insecurity - inability: None  . Transportation needs - medical: None  . Transportation needs - non-medical: None  Occupational History  . Occupation: Retired    Fish farm manager: RETIRED  Tobacco Use  . Smoking status: Never Smoker  . Smokeless tobacco: Never Used  Substance and Sexual Activity  . Alcohol use: No    Alcohol/week: 0.0 oz  . Drug use: No  . Sexual activity: No  Other Topics Concern  . None  Social History Narrative   3 children   Does not drink caffeinated beverages   Cares for SIL with dementia    Outpatient Encounter Medications as of 09/01/2017  Medication Sig  . diphenhydrAMINE (BENADRYL) 25 MG tablet Take 50 mg by mouth every 8 (eight) hours as needed for allergies.   Marland Kitchen EPINEPHrine (EPI-PEN) 0.3 mg/0.3 mL DEVI Inject 0.3 mg into the muscle daily as needed (allergic reaction).   . fluticasone (FLONASE) 50 MCG/ACT nasal spray Place 2 sprays into both nostrils 2 (two) times daily as needed. Reported on 09/10/2015  . glipiZIDE (GLUCOTROL XL) 10 MG 24 hr tablet Take 1 tablet (10 mg total) by mouth daily with breakfast.  . glucose blood (ONE TOUCH ULTRA TEST) test strip CHECK BLOOD SUGAR ONCE DAILY AND AS NEEDED (DX. E11.9)  . losartan-hydrochlorothiazide (HYZAAR) 100-25 MG tablet Take 1 tablet by mouth daily.  Glory Rosebush DELICA LANCETS 26R MISC Check blood sugar once daily and as directed. Dx E11.9  . potassium chloride SA (K-DUR,KLOR-CON) 20 MEQ tablet Take 1 tablet (20 mEq total) by mouth daily.  . ticlopidine (TICLID) 250 MG tablet Take 1 tablet (250 mg total) by mouth 2 (two) times daily with a meal.  .  [DISCONTINUED] levETIRAcetam (KEPPRA) 250 MG tablet Take 1 tablet (250 mg total) by mouth 2 (two) times daily.   No facility-administered encounter medications on file as of 09/01/2017.     Activities of Daily Living In your present state of health, do you have any difficulty performing the following activities: 09/01/2017 04/29/2017  Hearing? N N  Vision? N N  Difficulty concentrating or making decisions? N N  Walking or climbing stairs? N N  Dressing or bathing? N N  Doing errands, shopping? N -  Preparing Food and eating ? N -  Using the Toilet?  N -  In the past six months, have you accidently leaked urine? N -  Do you have problems with loss of bowel control? N -  Managing your Medications? N -  Managing your Finances? N -  Housekeeping or managing your Housekeeping? N -  Some recent data might be hidden    Patient Care Team: Tower, [name], MD as PCP - General Dasher, Rayvon Char, MD as Consulting Physician (Dermatology) Salomon Fick., MD as Referring Physician (Dentistry) Leandrew Koyanagi, MD as Referring Physician (Ophthalmology)    Assessment:   This is a routine wellness examination for Kymani.   Hearing Screening   125Hz  250Hz  500Hz  1000Hz  2000Hz  3000Hz  4000Hz  6000Hz  8000Hz   Right ear:   0 0 0  0    Left ear:   0 0 0  0    Vision Screening Comments: Last vision exam in Sept 2018 with Dr. Wallace Going    Exercise Activities and Dietary recommendations Current Exercise Habits: The patient does not participate in regular exercise at present, Exercise limited by: None identified  Goals    . Weight (lb) < 200 lb (90.7 kg)     Target weight is 180-185 lbs. Starting 09/01/2017, I will continue to monitor intake of carbohydrates in an effort to keep A1C within normal limits and to lose weight.        Fall Risk Fall Risk  09/01/2017 10/29/2016 08/27/2016 08/22/2015  Falls in the past year? Yes No No Yes  Comment stepped in a hole and fell backwards; denies injury -  - -  Number falls in past yr: 1 - - 2 or more  Injury with Fall? No - - Yes   Depression Screen PHQ 2/9 Scores 09/01/2017 08/27/2016 08/22/2015 04/28/2012  PHQ - 2 Score 0 0 0 0  PHQ- 9 Score 0 - - -     Cognitive Function MMSE - Mini Mental State Exam 09/01/2017 08/27/2016  Orientation to time 5 5  Orientation to Place 5 5  Registration 3 3  Attention/ Calculation 0 0  Recall 3 3  Language- name 2 objects 0 0  Language- repeat 1 1  Language- follow 3 step command 3 3  Language- read & follow direction 0 0  Write a sentence 0 0  Copy design 0 0  Total score 20 20     PLEASE NOTE: A Mini-Cog screen was completed. Maximum score is 20. A value of 0 denotes this part of [place]stein MMSE was not completed or the patient failed this part of the Mini-Cog screening.   Mini-Cog Screening Orientation to Time - Max 5 pts Orientation to Place - Max 5 pts Registration - Max 3 pts Recall - Max 3 pts Language Repeat - Max 1 pts Language Follow 3 Step Command - Max 3 pts     Immunization History  Administered Date(s) Administered  . Influenza Split 05/05/2011, 04/28/2012  . Influenza Whole 04/24/2005, 06/03/2010  . Influenza,inj,Quad PF,6+ Mos 05/16/2013  . Influenza-Unspecified 04/01/2014  . Pneumococcal Conjugate-13 08/22/2015  . Pneumococcal Polysaccharide-23 12/03/2010  . Td 11/27/1997, 10/28/2011    Screening Tests Health Maintenance  Topic Date Due  . INFLUENZA VACCINE  10/04/2025 (Originally 02/04/2017)  . COLONOSCOPY  09/01/2048 (Originally 03/17/2017)  . HEMOGLOBIN A1C  09/02/2017  . MAMMOGRAM  09/24/2017  . OPHTHALMOLOGY EXAM  02/18/2018  . FOOT EXAM  03/16/2018  . TETANUS/TDAP  10/27/2021  . DEXA SCAN  Completed  . PNA vac Low Risk Adult  Completed  Plan:    I have personally reviewed, addressed, and noted the following in the patient's chart:  A. Medical and social history B. Use of alcohol, tobacco or illicit drugs  C. Current medications and  supplements D. Functional ability and status E.  Nutritional status F.  Physical activity G. Advance directives H. List of other physicians I.  Hospitalizations, surgeries, and ER visits in previous 12 months J.  [place] to include hearing, vision, cognitive, depression L. Referrals and appointments - none  In addition, I have reviewed and discussed with patient certain preventive protocols, quality metrics, and best practice recommendations. A written personalized care plan for preventive services as well as general preventive health recommendations were provided to patient.  See attached scanned questionnaire for additional information.   Signed,   Lindell Noe, MHA, BS, LPN Health Coach

## 2017-09-01 NOTE — Progress Notes (Signed)
PCP notes:   Health maintenance:  Colonoscopy - pt states she will not have another one Flu vaccine - pt continues to decline vaccination  Abnormal screenings:   Fall risk - hx of single fall Fall Risk  09/01/2017 10/29/2016 08/27/2016 08/22/2015  Falls in the past year? Yes No No Yes  Comment stepped in a hole and fell backwards; denies injury - - -  Number falls in past yr: 1 - - 2 or more  Injury with Fall? No - - Yes   Hearing - failed  Hearing Screening   125Hz  250Hz  500Hz  1000Hz  2000Hz  3000Hz  4000Hz  6000Hz  8000Hz   Right ear:   0 0 0  0     Patient concerns:   Patient has red, painful area to left hand on palm.   se concerns:  None  Next PCP appt:   09/08/2017 @ 0930  I reviewed health advisor's note, was available for consultation, and agree with documentation and plan. Loura Pardon MD

## 2017-09-01 NOTE — Patient Instructions (Signed)
Margaret Vazquez , Thank you for taking time to come for your Medicare Wellness Visit. I appreciate your ongoing commitment to your health goals. Please review the following plan we discussed and let me know if I can assist you in the future.   These are the goals we discussed: Goals    . Weight (lb) < 200 lb (90.7 kg)     Target weight is 180-185 lbs. Starting 09/01/2017, I will continue to monitor intake of carbohydrates in an effort to keep A1C within normal limits and to lose weight.        This is a list of the screening recommended for you and due dates:  Health Maintenance  Topic Date Due  . Flu Shot  10/04/2025*  . Colon Cancer Screening  09/01/2048*  . Hemoglobin A1C  09/02/2017  . Mammogram  09/24/2017  . Eye exam for diabetics  02/18/2018  . Complete foot exam   03/16/2018  . Tetanus Vaccine  10/27/2021  . DEXA scan (bone density measurement)  Completed  . Pneumonia vaccines  Completed  *Topic was postponed. The date shown is not the original due date.   Preventive Care for Adults  A healthy lifestyle and preventive care can promote health and wellness. Preventive health guidelines for adults include the following key practices.  . A routine yearly physical is a good way to check with your health care provider about your health and preventive screening. It is a chance to share any concerns and updates on your health and to receive a thorough exam.  . Visit your dentist for a routine exam and preventive care every 6 months. Brush your teeth twice a day and floss once a day. Good oral hygiene prevents tooth decay and gum disease.  . The frequency of eye exams is based on your age, health, family medical history, use  of contact lenses, and other factors. Follow your health care provider's recommendations for frequency of eye exams.  . Eat a healthy diet. Foods like vegetables, fruits, whole grains, low-fat dairy products, and lean protein foods contain the nutrients you need  without too many calories. Decrease your intake of foods high in solid fats, added sugars, and salt. Eat the right amount of calories for you. Get information about a proper diet from your health care provider, if necessary.  . Regular physical exercise is one of the most important things you can do for your health. Most adults should get at least 150 minutes of moderate-intensity exercise (any activity that increases your heart rate and causes you to sweat) each week. In addition, most adults need muscle-strengthening exercises on 2 or more days a week.  Silver Sneakers may be a benefit available to you. To determine eligibility, you may visit the website: www.silversneakers.com or contact program at 204-477-8189 Mon-Fri between 8AM-8PM.   . Maintain a healthy weight. The body mass index (BMI) is a screening tool to identify possible weight problems. It provides an estimate of body fat based on height and weight. Your health care provider can find your BMI and can help you achieve or maintain a healthy weight.   For adults 20 years and older: ? A BMI below 18.5 is considered underweight. ? A BMI of 18.5 to 24.9 is normal. ? A BMI of 25 to 29.9 is considered overweight. ? A BMI of 30 and above is considered obese.   . Maintain normal blood lipids and cholesterol levels by exercising and minimizing your intake of saturated fat. Eat a  balanced diet with plenty of fruit and vegetables. Blood tests for lipids and cholesterol should begin at age 41 and be repeated every 5 years. If your lipid or cholesterol levels are high, you are over 50, or you are at high risk for heart disease, you may need your cholesterol levels checked more frequently. Ongoing high lipid and cholesterol levels should be treated with medicines if diet and exercise are not working.  . If you smoke, find out from your health care provider how to quit. If you do not use tobacco, please do not start.  . If you choose to drink  alcohol, please do not consume more than 2 drinks per day. One drink is considered to be 12 ounces (355 mL) of beer, 5 ounces (148 mL) of wine, or 1.5 ounces (44 mL) of liquor.  . If you are 74-95 years old, ask your health care provider if you should take aspirin to prevent strokes.  . Use sunscreen. Apply sunscreen liberally and repeatedly throughout the day. You should seek shade when your shadow is shorter than you. Protect yourself by wearing long sleeves, pants, a wide-brimmed hat, and sunglasses year round, whenever you are outdoors.  . Once a month, do a whole body skin exam, using a mirror to look at the skin on your back. Tell your health care provider of new moles, moles that have irregular borders, moles that are larger than a pencil eraser, or moles that have changed in shape or color.

## 2017-09-07 ENCOUNTER — Other Ambulatory Visit: Payer: Self-pay | Admitting: Family Medicine

## 2017-09-07 DIAGNOSIS — Z1231 Encounter for screening mammogram for malignant neoplasm of breast: Secondary | ICD-10-CM

## 2017-09-08 ENCOUNTER — Encounter: Payer: Self-pay | Admitting: Family Medicine

## 2017-09-08 ENCOUNTER — Ambulatory Visit (INDEPENDENT_AMBULATORY_CARE_PROVIDER_SITE_OTHER): Payer: PPO | Admitting: Family Medicine

## 2017-09-08 VITALS — BP 126/64 | HR 67 | Temp 97.6°F | Ht 68.0 in | Wt 204.2 lb

## 2017-09-08 DIAGNOSIS — E1151 Type 2 diabetes mellitus with diabetic peripheral angiopathy without gangrene: Secondary | ICD-10-CM | POA: Diagnosis not present

## 2017-09-08 DIAGNOSIS — I480 Paroxysmal atrial fibrillation: Secondary | ICD-10-CM | POA: Diagnosis not present

## 2017-09-08 DIAGNOSIS — I10 Essential (primary) hypertension: Secondary | ICD-10-CM | POA: Diagnosis not present

## 2017-09-08 DIAGNOSIS — Z0000 Encounter for general adult medical examination without abnormal findings: Secondary | ICD-10-CM

## 2017-09-08 DIAGNOSIS — E538 Deficiency of other specified B group vitamins: Secondary | ICD-10-CM

## 2017-09-08 DIAGNOSIS — E1159 Type 2 diabetes mellitus with other circulatory complications: Secondary | ICD-10-CM

## 2017-09-08 DIAGNOSIS — E669 Obesity, unspecified: Secondary | ICD-10-CM

## 2017-09-08 DIAGNOSIS — E7800 Pure hypercholesterolemia, unspecified: Secondary | ICD-10-CM | POA: Diagnosis not present

## 2017-09-08 MED ORDER — LOSARTAN POTASSIUM-HCTZ 100-25 MG PO TABS: 1.0000 | ORAL_TABLET | Freq: Every day | ORAL | 3 refills | Status: DC

## 2017-09-08 MED ORDER — GLIPIZIDE ER 10 MG PO TB24: 10.0000 mg | ORAL_TABLET | Freq: Every day | ORAL | 3 refills | Status: DC

## 2017-09-08 MED ORDER — POTASSIUM CHLORIDE CRYS ER 20 MEQ PO TBCR: 20.0000 meq | EXTENDED_RELEASE_TABLET | Freq: Every day | ORAL | 3 refills | Status: DC
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
## 2017-09-08 NOTE — Patient Instructions (Addendum)
Diabetes is worse - due to lack of exercise  Add some exercise and work up to 30 minutes per day (walking outdoors or at the mall)  Try to get most of your carbohydrates from produce (with the exception of white potatoes)  Eat less bread/pasta/rice/snack foods/cereals/sweets and other items from the middle of the grocery store (processed carbs)   Follow up in 3 months with labs prior   Get your mammogram as planned   Start taking your B12 every day instead of 3 days per week   We will set up the cologuard program

## 2017-09-09 NOTE — Assessment & Plan Note (Signed)
bp in fair control at this time  BP Readings from Last 1 Encounters:  09/08/17 126/64   No changes needed Disc lifstyle change with low sodium diet and exercise  Labs reviewed

## 2017-09-09 NOTE — Assessment & Plan Note (Signed)
Reviewed health habits including diet and exercise and skin cancer prevention Reviewed appropriate screening tests for age  Also reviewed health mt list, fam hx and immunization status , as well as social and family history   Rev amw  Declines hearing aides  Disc fall prev  Declines flu shot and zoster vaccine  Enc wt loss  Will have mammogram on 3/28 Nl last dexa  Enc exercise to imp DM control  Inc B12 to daily  F/u 3 mo  Set up for cologuard kit

## 2017-09-09 NOTE — Assessment & Plan Note (Signed)
Discussed how this problem influences overall health and the risks it imposes  Reviewed plan for weight loss with lower calorie diet (via better food choices and also portion control or program like weight watchers) and exercise building up to or more than 30 minutes 5 days per week including some aerobic activity    

## 2017-09-09 NOTE — Assessment & Plan Note (Signed)
Lab Results  Component Value Date   [id number] 09/01/2017   Drifting down Enc to take her oral B12 daily inst of 3 times per week

## 2017-09-09 NOTE — Assessment & Plan Note (Signed)
Lab Results  Component Value Date   HGBA1C 7.5 (H) 09/01/2017   This is up from 6.5 a year ago  Disc imp of wt loss  Plan made for 30 min of exercise per day Low glycemic diet On ARB utd eye/foot care  F/u 3 mo

## 2017-09-09 NOTE — Assessment & Plan Note (Signed)
Disc goals for lipids and reasons to control them Rev labs with pt Rev low sat fat diet in detail Not at goal  Is intolerant of statins

## 2017-09-09 NOTE — Assessment & Plan Note (Signed)
No recent problems Continues to get ticlid from [facility] call for px when needed

## 2017-09-10 ENCOUNTER — Other Ambulatory Visit: Payer: Self-pay | Admitting: Family Medicine

## 2017-09-10 ENCOUNTER — Other Ambulatory Visit: Payer: PPO

## 2017-09-10 MED ORDER — GLUCOSE BLOOD VI STRP: ORAL_STRIP | 1 refills | Status: DC

## 2017-09-10 NOTE — Telephone Encounter (Signed)
Copied from [place] [id number]. Topic: Quick Communication - Rx Refill/Question >> Sep 10, 2017 11:14 AM [name] wrote: Medication: glucose blood (ONE TOUCH ULTRA TEST) test strip   Has the patient contacted their pharmacy? Yes.     (Agent: If no, request that the patient contact the pharmacy for the refill.)   Preferred Pharmacy (with phone number or street name): [id number] - [place], [place]   Agent: Please be advised that RX refills may take up to 3 business days. We ask that you follow-up with your pharmacy.

## 2017-09-10 NOTE — Telephone Encounter (Signed)
Rx sent to pharmacy   

## 2017-09-16 DIAGNOSIS — Z1212 Encounter for screening for malignant neoplasm of rectum: Secondary | ICD-10-CM | POA: Diagnosis not present

## 2017-09-16 DIAGNOSIS — Z1211 Encounter for screening for malignant neoplasm of colon: Secondary | ICD-10-CM | POA: Diagnosis not present

## 2017-09-18 LAB — COLOGUARD: Cologuard: NEGATIVE

## 2017-10-01 ENCOUNTER — Encounter: Payer: Self-pay | Admitting: *Deleted

## 2017-10-01 ENCOUNTER — Ambulatory Visit
Admission: RE | Admit: 2017-10-01 | Discharge: 2017-10-01 | Disposition: A | Discharge status: Home / Self Care | Payer: PPO | Source: Ambulatory Visit | Attending: Family Medicine | Admitting: Family Medicine

## 2017-10-01 DIAGNOSIS — Z1231 Encounter for screening mammogram for malignant neoplasm of breast: Secondary | ICD-10-CM

## 2017-10-13 ENCOUNTER — Telehealth: Payer: Self-pay | Admitting: Family Medicine

## 2017-10-13 ENCOUNTER — Ambulatory Visit: Payer: Self-pay

## 2017-10-13 NOTE — Telephone Encounter (Signed)
See triage note, appointment scheduled 10/15/17.

## 2017-10-13 NOTE — Telephone Encounter (Signed)
Left message to call back and discuss medications. Glucophage refilled 09/08/17.

## 2017-10-13 NOTE — Telephone Encounter (Signed)
Copied from [place] [id number]. Topic: Quick Communication - See Telephone Encounter >> Oct 13, 2017 12:30 PM Boyd Kerbs wrote: CRM for notification.   Pt. Seen dr. In March and her blood sugar has been elevated to 200 lately, and Blood thinner she gets from [place] she can not get anymore.  Asking if could call in medication for her on Blood Thinner and Blood Sugar  She prefers not to take Coumadin but has to watch cost of prescriptions too.   [facility] [place], [place] - [address]  [place] [address]  Phone: [phone] Fax: [phone]     See Telephone encounter for: 10/13/17.

## 2017-10-13 NOTE — Telephone Encounter (Signed)
Pt calling to report that her blood sugars are up to the range of 150-230 since starting the glipizide. She reports fatigue and weakness. Her usual blood sugar range is 90-100. Pt also called to report that her ticlopidine that she was getting from [place] is no longer an option. Pt states [place] is  no longer allowed to provide her blood thinner that she was taking due to her previous TIA's. She states she has enough ticlopine to last a week. Pt cannot come to 30 minute appt Wednesday due to a skin procedure. Appt made for Thursday am with Dr. Glori Bickers. Care advice and call back indicators provided.  Reason for Disposition . [1] Symptoms of high blood sugar (e.g., frequent urination, weak, weight loss) AND [2] not able to test blood glucose  Answer Assessment - Initial Assessment Questions 1. BLOOD GLUCOSE: "What is your blood glucose level?"      150-230 2. ONSET: "When did you check the blood glucose?"     Twice a day 3. USUAL RANGE: "What is your glucose level usually?" (e.g., usual fasting morning value, usual evening value)     90 to 100 4. KETONES: "Do you check for ketones (urine or blood test strips)?" If yes, ask: "What does the test show now?"      no 5. TYPE 1 or 2:  "Do you know what type of diabetes you have?"  (e.g., Type 1, Type 2, Gestational; doesn't know)      Type 2  6. INSULIN: "Do you take insulin?" If yes, ask: "Have you missed any shots recently?" no 7. DIABETES PILLS: "Do you take any pills for your diabetes?" If yes, ask: "Have you missed taking any pills recently?"     Yes Glipizide 8. OTHER SYMPTOMS: "Do you have any symptoms?" (e.g., fever, frequent urination, difficulty breathing, dizziness, weakness, vomiting)     fatigue 9. PREGNANCY: "Is there any chance you are pregnant?" "When was your last menstrual period?"     n/a  Protocols used: DIABETES - HIGH BLOOD SUGAR-A-AH

## 2017-10-14 DIAGNOSIS — C44622 Squamous cell carcinoma of skin of right upper limb, including shoulder: Secondary | ICD-10-CM | POA: Diagnosis not present

## 2017-10-15 ENCOUNTER — Encounter: Payer: Self-pay | Admitting: Family Medicine

## 2017-10-15 ENCOUNTER — Telehealth: Payer: Self-pay | Admitting: Internal Medicine

## 2017-10-15 ENCOUNTER — Ambulatory Visit (INDEPENDENT_AMBULATORY_CARE_PROVIDER_SITE_OTHER): Payer: PPO | Admitting: Family Medicine

## 2017-10-15 VITALS — BP 118/66 | HR 61 | Temp 97.6°F | Ht 68.0 in | Wt 198.8 lb

## 2017-10-15 DIAGNOSIS — E669 Obesity, unspecified: Secondary | ICD-10-CM | POA: Diagnosis not present

## 2017-10-15 DIAGNOSIS — I10 Essential (primary) hypertension: Secondary | ICD-10-CM | POA: Diagnosis not present

## 2017-10-15 DIAGNOSIS — D0461 Carcinoma in situ of skin of right upper limb, including shoulder: Secondary | ICD-10-CM | POA: Diagnosis not present

## 2017-10-15 DIAGNOSIS — E1151 Type 2 diabetes mellitus with diabetic peripheral angiopathy without gangrene: Secondary | ICD-10-CM | POA: Diagnosis not present

## 2017-10-15 DIAGNOSIS — Z8673 Personal history of transient ischemic attack (TIA), and cerebral infarction without residual deficits: Secondary | ICD-10-CM

## 2017-10-15 DIAGNOSIS — R0789 Other chest pain: Secondary | ICD-10-CM | POA: Diagnosis not present

## 2017-10-15 DIAGNOSIS — L905 Scar conditions and fibrosis of skin: Secondary | ICD-10-CM | POA: Diagnosis not present

## 2017-10-15 MED ORDER — METFORMIN HCL ER 500 MG PO TB24: 500.0000 mg | ORAL_TABLET | Freq: Every day | ORAL | 11 refills | Status: DC

## 2017-10-15 NOTE — Assessment & Plan Note (Signed)
ticlid is no longer available (about to run out)  She gets hives from plavix and asa  Unsure what to do next for cva prev-will reach out to Dr Leonie Man (her neurologist)

## 2017-10-15 NOTE — Assessment & Plan Note (Signed)
Discussed how this problem influences overall health and the risks it imposes  Reviewed plan for weight loss with lower calorie diet (via better food choices and also portion control or program like weight watchers) and exercise building up to or more than 30 minutes 5 days per week including some aerobic activity    

## 2017-10-15 NOTE — Progress Notes (Signed)
Subjective:    Patient ID: Margaret Vazquez, female    DOB: 10/16/1935, 82 y.o.   MRN: [id number]  HPI Here for elevated blood sugars/diabetes   Wt Readings from Last 3 Encounters:  10/15/17 198 lb 12 oz (90.2 kg)  09/08/17 204 lb 4 oz (92.6 kg)  09/01/17 199 lb (90.3 kg)   30.22 kg/m   Had derm procedure on wrist yesterday-it is sore But doing ok    Last visit  Lab Results  Component Value Date   HGBA1C 7.5 (H) 09/01/2017   This was up  Working on diet  Made a plan for exercise-to work up to 30 minutes per day (she cannot exercise- is too tired)   Low exercise tolerance - she gets chest pressure with exercise more than 15 minutes  Sweats No sob or nausea   EKG today show sinus bradycardia with rate of 57 and no acute changes Looks like last cardiology visit may be a year ago?    Takes glipizide xl 10 mg daily   Intolerant of metformin- gave her diarrhea   Blood glucoses are running high- 150s- 230  Really watching her diet in fact down 6 lbs (great)  Lot of stress - with her husband  Has had other health problems    ticlid is no longer available in [place] or here  (hx of cva/for stroke prev) The note from Dr Leonie Man from June Note    I spoke to the patient today while she was accompanying her sister for her schedule office visit. Patient informed me that she had been getting Ticlid from [place] but she has been told that she may no longer be able to do so. I reviewed the patient's chart and noted that she had history of aspirin allergy and had not felt so good on Plavix prior to been switched to Ticlid. I recommend patient switch back to Plavix 75 mg daily when she runs out of her Ticlid. She voiced understanding.     however chart lists reaction of hives from this (and aspirin)   bp is stable today  No cp or palpitations or headaches or edema  No side effects to medicines  BP Readings from Last 3 Encounters:  10/15/17 118/66  09/08/17 126/64  09/01/17  136/70      Patient Active Problem List   Diagnosis Date Noted  . Chest pressure 10/15/2017  . Routine general medical examination at a health care facility 08/24/2016  . Paroxysmal atrial fibrillation ([place]) 04/07/2016  . Fall 08/13/2015  . Obesity (BMI 30-39.9) 08/23/2014  . Type 2 diabetes mellitus with diabetic peripheral angiopathy without gangrene, without long-term current use of insulin ([place]) 01/12/2014  . TIA (transient ischemic attack) 01/11/2014  . CVA (cerebral infarction) 01/11/2014  . LOOP Recorder LINQ 06/07/2013  . Fatigue 06/07/2013  . Sinusitis, chronic 05/16/2013  . H/O: CVA (cerebrovascular accident) 02/15/2013  . Encounter for Medicare annual wellness exam 10/27/2012  . Encopresis(307.7) 12/15/2011  . Post-menopausal 10/28/2011  . PERSONAL HX COLONIC POLYPS 09/04/2009  . B12 deficiency 03/06/2009  . ALLERGIC RHINITIS 02/14/2008  . BACK PAIN, LUMBAR 11/03/2007  . Hyperlipidemia 11/24/2006  . Essential hypertension 11/24/2006  . GERD 10/09/2006  . OVERACTIVE BLADDER 10/09/2006  . INCONTINENCE, URGE 10/09/2006  . SKIN CANCER, HX OF 10/09/2006   Past Medical History:  Diagnosis Date  . Allergy history, drug    Aspirin  . CAD (coronary artery disease) 04/08/11   non obst by cath  . Cervical stenosis of  spine    With neck pain  . Colon polyps 2009  . Complication of anesthesia    slow to wake after anesthesia in 1980s  . Diabetes mellitus    type II  . GERD (gastroesophageal reflux disease)   . Hyperlipidemia    myalgias with Lipitor and Zetia  . Hypertension   . Migraine   . Skin cancer    hx of basal cell/ak's  . Stroke [facility]) August 2014  . TIA (transient ischemic attack)    Past Surgical History:  Procedure Laterality Date  . ABD [place]  07/2003   Negative  . APPENDECTOMY    . BREAST EXCISIONAL BIOPSY Left 1992   benign  . BREAST SURGERY  1992   breast biopsy  . CARDIAC CATHETERIZATION  04/07/2011   non obst CAD (Dr Burt Knack)  . CATARACT  EXTRACTION W/PHACO Left 04/01/2017   Procedure: CATARACT EXTRACTION PHACO AND INTRAOCULAR LENS PLACEMENT ([place]) LEFT DIABETIC;  Surgeon: Leandrew Koyanagi, MD;  Location: [place];  Service: Ophthalmology;  Laterality: Left;  Diabetic - oral meds  . CATARACT EXTRACTION W/PHACO Right 04/29/2017   Procedure: CATARACT EXTRACTION PHACO AND INTRAOCULAR LENS PLACEMENT ([place]) RIGHT DIABETIC;  Surgeon: Leandrew Koyanagi, MD;  Location: [place];  Service: Ophthalmology;  Laterality: Right;  Diabetic - oral meds  . COLONOSCOPY  12/2007   Adenomatous colon polyps  . CYSTOSCOPY W/ DECANNULATION  03/2000   Normal  . EYE SURGERY  2005   tear duct surgery  . LOOP RECORDER IMPLANT N/A 02/16/2013   Procedure: LOOP RECORDER IMPLANT;  Surgeon: Deboraha Sprang, MD;  Location: [facility] CATH LAB;  Service: Cardiovascular;  Laterality: N/A;  . NASAL SINUS SURGERY  01/2005  . STRESS CARDIOLITE  11/1999   Normal/ negative  . [place] DUCT SURGERY  2005  . TEE WITHOUT CARDIOVERSION N/A 02/16/2013   Procedure: TRANSESOPHAGEAL ECHOCARDIOGRAM (TEE);  Surgeon: Josue Hector, MD;  Location: [facility] ENDOSCOPY;  Service: Cardiovascular;  Laterality: N/A;  . TUBAL LIGATION     BTL   Social History   Tobacco Use  . Smoking status: Never Smoker  . Smokeless tobacco: Never Used  Substance Use Topics  . Alcohol use: No    Alcohol/week: 0.0 oz  . Drug use: No   Family History  Problem Relation Age of Onset  . Lung cancer Brother   . Diabetes Brother   . Pancreatic cancer Brother   . Heart disease Mother   . Heart disease Father   . Brain cancer Other   . Skin cancer Daughter   . Diabetes Sister   . Colon cancer Neg Hx    Allergies  Allergen Reactions  . Bee Venom Hives, Shortness Of Breath and Swelling  . Nabumetone Anaphylaxis  . Amoxicillin-Pot Clavulanate Hives and Swelling    To lips.  . Aspirin Hives  . Atorvastatin Swelling     joint pain/swelling, inc liver tests  . Clopidogrel Bisulfate  Hives  . Codeine Nausea And Vomiting  . Ezetimibe Other (See Comments)     fatigue  . Metformin And Related Other (See Comments)    Diarrhea   . Valsartan Other (See Comments)     fatigue  . Other Hives    **Red Meat**  SOB   Current Outpatient Medications on File Prior to Visit  Medication Sig Dispense Refill  . diphenhydrAMINE (BENADRYL) 25 MG tablet Take 50 mg by mouth every 8 (eight) hours as needed for allergies.     Marland Kitchen EPINEPHrine (EPI-PEN)  0.3 mg/0.3 mL DEVI Inject 0.3 mg into the muscle daily as needed (allergic reaction).     . fluticasone (FLONASE) 50 MCG/ACT nasal spray Place 2 sprays into both nostrils 2 (two) times daily as needed. Reported on 09/10/2015    . glipiZIDE (GLUCOTROL XL) 10 MG 24 hr tablet Take 1 tablet (10 mg total) by mouth daily with breakfast. 90 tablet 3  . glucose blood (ONE TOUCH ULTRA TEST) test strip CHECK BLOOD SUGAR ONCE DAILY AND AS NEEDED (DX. E11.9) 100 each 1  . losartan-hydrochlorothiazide (HYZAAR) 100-25 MG tablet Take 1 tablet by mouth daily. 90 tablet 3  . ONETOUCH DELICA LANCETS 78G MISC Check blood sugar once daily and as directed. Dx E11.9 100 each 1  . potassium chloride SA (K-DUR,KLOR-CON) 20 MEQ tablet Take 1 tablet (20 mEq total) by mouth daily. 90 tablet 3  . ticlopidine (TICLID) 250 MG tablet Take 1 tablet (250 mg total) by mouth 2 (two) times daily with a meal. 180 tablet 3   No current facility-administered medications on file prior to visit.     Review of Systems  Constitutional: Positive for diaphoresis and fatigue. Negative for activity change, appetite change, fever and unexpected weight change.  HENT: Positive for postnasal drip. Negative for congestion, ear pain, rhinorrhea, sinus pressure and sore throat.   Eyes: Negative for pain, redness and visual disturbance.  Respiratory: Positive for chest tightness. Negative for cough, shortness of breath, wheezing and stridor.   Cardiovascular: Positive for chest pain. Negative for  palpitations and leg swelling.  Gastrointestinal: Negative for abdominal pain, blood in stool, constipation and diarrhea.       Denies heartburn  Endocrine: Negative for polydipsia and polyuria.  Genitourinary: Negative for dysuria, frequency and urgency.  Musculoskeletal: Negative for arthralgias, back pain and myalgias.  Skin: Negative for pallor and rash.       Skin procedure R wrist yesterday-wrapped and sore  Allergic/Immunologic: Negative for environmental allergies.  Neurological: Negative for dizziness, syncope and headaches.  Hematological: Negative for adenopathy. Does not bruise/bleed easily.       Stressors-caring for husband  Psychiatric/Behavioral: Negative for decreased concentration and dysphoric mood. The patient is not nervous/anxious.        Objective:   Physical Exam  Constitutional: She appears well-developed and well-nourished. No distress.  obese and well appearing   HENT:  Head: Normocephalic and atraumatic.  Mouth/Throat: Oropharynx is clear and moist.  Eyes: Pupils are equal, round, and reactive to light. Conjunctivae and EOM are normal.  Neck: Normal range of motion. Neck supple. No JVD present. Carotid bruit is not present. No thyromegaly present.  Cardiovascular: Normal rate, regular rhythm, normal heart sounds and intact distal pulses. Exam reveals no gallop.  Pulmonary/Chest: Effort normal and breath sounds normal. No respiratory distress. She has no wheezes. She has no rales.  No crackles  Good air exch  Abdominal: Soft. Bowel sounds are normal. She exhibits no distension, no abdominal bruit and no mass. There is no tenderness. There is no rebound and no guarding.  Musculoskeletal: She exhibits no edema or tenderness.  No edema/palp cords /erythema or tenderness in LEs Neg Homans' sign  Lymphadenopathy:    She has no cervical adenopathy.  Neurological: She is alert. She has normal reflexes.  Skin: Skin is warm and dry. No rash noted. No pallor.  R  wrist is wrapped   Psychiatric: She has a normal mood and affect.          Assessment & Plan:  Problem List Items Addressed This Visit      Cardiovascular and Mediastinum   Essential hypertension    bp in fair control at this time  BP Readings from Last 1 Encounters:  10/15/17 118/66   No changes needed Disc lifstyle change with low sodium diet and exercise        Type 2 diabetes mellitus with diabetic peripheral angiopathy without gangrene, without long-term current use of insulin (HCC) - Primary    Glucose is rising (fating and pp)  Lab Results  Component Value Date   HGBA1C 7.5 (H) 09/01/2017   Eating well but unable to exercise due to poor exercise tolerance/chest pressure (will f/u with cardiol) On glipizide Intol of metformin bid  (diarrhea) Will try metformin xr 500 mg daily and see if she tolerates this  If she does-can titrate up (she will update in 1-2 weeks) If not will consider januvia or lantus (pt wants to avoid insulin) Disc DM diet/low glycemic eating  3 mo f/u from last visit still stands Enc further wt loss       Relevant Medications   metFORMIN (GLUCOPHAGE-XR) 500 MG 24 hr tablet     Other   Chest pressure    With exertion-pt has to rest after 15 minutes of any activity  No sob or nausea  Sweats easily however  No doubt deconditioned Pt states this has been d/w cardiology in the past but she is due for an appt and her symptoms have worsened  EKG today is reassuring with sinus brady and no acute changes Has a loop recorder- prev CVA Ref to cardiology for eval  If worse-adv to go to ED/ she voiced understanding       Relevant Orders   EKG 12-Lead (Completed)   Ambulatory referral to Cardiology   H/O: CVA (cerebrovascular accident)    ticlid is no longer available (about to run out)  She gets hives from plavix and asa  Unsure what to do next for cva prev-will reach out to Dr Leonie Man (her neurologist)       Obesity (BMI 30-39.9)     Discussed how this problem influences overall health and the risks it imposes  Reviewed plan for weight loss with lower calorie diet (via better food choices and also portion control or program like weight watchers) and exercise building up to or more than 30 minutes 5 days per week including some aerobic activity         Relevant Medications   metFORMIN (GLUCOPHAGE-XR) 500 MG 24 hr tablet

## 2017-10-15 NOTE — Assessment & Plan Note (Signed)
Glucose is rising (fating and pp)  Lab Results  Component Value Date   HGBA1C 7.5 (H) 09/01/2017   Eating well but unable to exercise due to poor exercise tolerance/chest pressure (will f/u with cardiol) On glipizide Intol of metformin bid  (diarrhea) Will try metformin xr 500 mg daily and see if she tolerates this  If she does-can titrate up (she will update in 1-2 weeks) If not will consider januvia or lantus (pt wants to avoid insulin) Disc DM diet/low glycemic eating  3 mo f/u from last visit still stands Enc further wt loss

## 2017-10-15 NOTE — Assessment & Plan Note (Signed)
With exertion-pt has to rest after 15 minutes of any activity  No sob or nausea  Sweats easily however  No doubt deconditioned Pt states this has been d/w cardiology in the past but she is due for an appt and her symptoms have worsened  EKG today is reassuring with sinus brady and no acute changes Has a loop recorder- prev CVA Ref to cardiology for eval  If worse-adv to go to ED/ she voiced understanding

## 2017-10-15 NOTE — Telephone Encounter (Signed)
Spoke with Haynes Dage, RN at Dr Marliss Coots office [facility]). She stated Dr Glori Bickers was pleased with a f/up appt with Dr Caryl Comes in two weeks. She stated pt needed to be seen for intermittent chest pressure with exertion and perhaps needs a stress test.

## 2017-10-15 NOTE — Assessment & Plan Note (Signed)
bp in fair control at this time  BP Readings from Last 1 Encounters:  10/15/17 118/66   No changes needed Disc lifstyle change with low sodium diet and exercise

## 2017-10-15 NOTE — Telephone Encounter (Signed)
New message  Sinus Bradycardia, rate 57 per Haynes Dage at [place] of Chest Pain: STAT if CP now or developed within 24 hours  1. Are you having CP right now? Chest pressure with exercise  2. Are you experiencing any other symptoms (ex. SOB, nausea, vomiting, sweating)? NO  3. How long have you been experiencing CP? At least 1 month  4. Is your CP continuous or coming and going? Coming and going   5. Have you taken Nitroglycerin? n/a ?

## 2017-10-15 NOTE — Patient Instructions (Addendum)
For diabetes- keep eating well  Let's try metformin xr (it may not give you the diarrhea that the short acting metformin gave you)  We will start with 500 mg once daily and we can increase it  Call in 1-2 weeks- tell us how you are tolerating it and how blood sugar is doing  If it does- stop it and let me know  Continue the glipizide   We will refer you to cardiology for the chest symptoms  If you get worse symptoms that do not go away-go to the ED   I will send a message to Dr Leonie Man re: what to do about your blood thinner   Take care of yourself

## 2017-10-20 ENCOUNTER — Telehealth: Payer: Self-pay

## 2017-10-20 MED ORDER — CILOSTAZOL 100 MG PO TABS: 100.0000 mg | ORAL_TABLET | Freq: Two times a day (BID) | ORAL | 11 refills | Status: DC

## 2017-10-20 NOTE — Telephone Encounter (Signed)
Dr Leonie Man recommended trying Pletal  I sent it to her [facility]= please re direct if she wants a different pharmacy Alert me if any problems or side effects  Sending this to [place] and [place]  thaks

## 2017-10-20 NOTE — Telephone Encounter (Signed)
You are welcome

## 2017-10-20 NOTE — Telephone Encounter (Signed)
I am routing to Dr Leonie Man for advisement Last note says change to plavix- but this gives her hives  What would be next choice for anti coagulation?

## 2017-10-20 NOTE — Telephone Encounter (Signed)
Just saw the staff note recommending Pletal- thanks so much Dr Leonie Man

## 2017-10-20 NOTE — Telephone Encounter (Signed)
Patient was in office today with spouse for his AWV appt. Patient is concerned that she only has 2 days remaining of current anticoagulation medication.   Patient is requesting PCP prescribe another medication. PCP notified.

## 2017-10-22 NOTE — Telephone Encounter (Signed)
Spoke with patient and advised her that Cilostazol had been called in to [facility].

## 2017-10-28 DIAGNOSIS — D0461 Carcinoma in situ of skin of right upper limb, including shoulder: Secondary | ICD-10-CM | POA: Diagnosis not present

## 2017-10-29 ENCOUNTER — Encounter: Payer: Self-pay | Admitting: Internal Medicine

## 2017-10-29 ENCOUNTER — Ambulatory Visit: Payer: PPO | Admitting: Internal Medicine

## 2017-10-29 VITALS — BP 138/70 | HR 71 | Ht 68.0 in | Wt 201.6 lb

## 2017-10-29 DIAGNOSIS — R0609 Other forms of dyspnea: Secondary | ICD-10-CM | POA: Diagnosis not present

## 2017-10-29 DIAGNOSIS — R079 Chest pain, unspecified: Secondary | ICD-10-CM

## 2017-10-29 DIAGNOSIS — R0789 Other chest pain: Secondary | ICD-10-CM

## 2017-10-29 DIAGNOSIS — R0600 Dyspnea, unspecified: Secondary | ICD-10-CM

## 2017-10-29 MED ORDER — METOPROLOL TARTRATE 50 MG PO TABS: 50.0000 mg | ORAL_TABLET | Freq: Once | ORAL | 0 refills | Status: DC

## 2017-10-29 NOTE — Progress Notes (Signed)
F.[email or web address]      Patient Care Team: Tower, [name], MD as PCP - General Dasher, Rayvon Char, MD as Consulting Physician (Dermatology) Salomon Fick., MD as Referring Physician (Dentistry) Leandrew Koyanagi, MD as Referring Physician (Ophthalmology)   HPI  Margaret Vazquez is a 82 y.o. female Seen in followup for a loop recorder implanted for cryptogenic stroke. She has significant hypertension which is much improved  She was found to have atrial fibrillation that prompted a visit to the atrial fibrillation clinic.This event occurred in the context of treatment anaphylaxis w epinephrine.. Discussion with Dr. Leonie Man suggested no indication for anticoagulation     She has remote history of chest pain and underwent catheterization 2012 that demonstrated no obstructive disease. (Details not available)  Recently has noted worsening DOE and exertional chest discomfort with duration increasing and more easily provoked.    Long standing diabetes   No edema   She has Carotid disease but has been intolerant of statins      Past Medical History:  Diagnosis Date  . Allergy history, drug    Aspirin  . CAD (coronary artery disease) 04/08/11   non obst by cath  . Cervical stenosis of spine    With neck pain  . Colon polyps 2009  . Complication of anesthesia    slow to wake after anesthesia in 1980s  . Diabetes mellitus    type II  . GERD (gastroesophageal reflux disease)   . Hyperlipidemia    myalgias with Lipitor and Zetia  . Hypertension   . Migraine   . Skin cancer    hx of basal cell/ak's  . Stroke [facility]) August 2014  . TIA (transient ischemic attack)     Past Surgical History:  Procedure Laterality Date  . ABD [place]  07/2003   Negative  . APPENDECTOMY    . BREAST EXCISIONAL BIOPSY Left 1992   benign  . BREAST SURGERY  1992   breast biopsy  . CARDIAC CATHETERIZATION  04/07/2011   non obst CAD (Dr Burt Knack)  . CATARACT EXTRACTION W/PHACO Left 04/01/2017   Procedure: CATARACT  EXTRACTION PHACO AND INTRAOCULAR LENS PLACEMENT ([place]) LEFT DIABETIC;  Surgeon: Leandrew Koyanagi, MD;  Location: [place];  Service: Ophthalmology;  Laterality: Left;  Diabetic - oral meds  . CATARACT EXTRACTION W/PHACO Right 04/29/2017   Procedure: CATARACT EXTRACTION PHACO AND INTRAOCULAR LENS PLACEMENT ([place]) RIGHT DIABETIC;  Surgeon: Leandrew Koyanagi, MD;  Location: [place];  Service: Ophthalmology;  Laterality: Right;  Diabetic - oral meds  . COLONOSCOPY  12/2007   Adenomatous colon polyps  . CYSTOSCOPY W/ DECANNULATION  03/2000   Normal  . EYE SURGERY  2005   tear duct surgery  . LOOP RECORDER IMPLANT N/A 02/16/2013   Procedure: LOOP RECORDER IMPLANT;  Surgeon: Deboraha Sprang, MD;  Location: [facility] CATH LAB;  Service: Cardiovascular;  Laterality: N/A;  . NASAL SINUS SURGERY  01/2005  . STRESS CARDIOLITE  11/1999   Normal/ negative  . [place] DUCT SURGERY  2005  . TEE WITHOUT CARDIOVERSION N/A 02/16/2013   Procedure: TRANSESOPHAGEAL ECHOCARDIOGRAM (TEE);  Surgeon: Josue Hector, MD;  Location: [facility] ENDOSCOPY;  Service: Cardiovascular;  Laterality: N/A;  . TUBAL LIGATION     BTL    Current Outpatient Medications  Medication Sig Dispense Refill  . cilostazol (PLETAL) 100 MG tablet Take 1 tablet (100 mg total) by mouth 2 (two) times daily. 60 tablet 11  . diphenhydrAMINE (BENADRYL) 25 MG tablet Take 50 mg by  mouth every 8 (eight) hours as needed for allergies.     Marland Kitchen EPINEPHrine (EPI-PEN) 0.3 mg/0.3 mL DEVI Inject 0.3 mg into the muscle daily as needed (allergic reaction).     . fluticasone (FLONASE) 50 MCG/ACT nasal spray Place 2 sprays into both nostrils 2 (two) times daily as needed. Reported on 09/10/2015    . glipiZIDE (GLUCOTROL XL) 10 MG 24 hr tablet Take 1 tablet (10 mg total) by mouth daily with breakfast. 90 tablet 3  . glucose blood (ONE TOUCH ULTRA TEST) test strip CHECK BLOOD SUGAR ONCE DAILY AND AS NEEDED (DX. E11.9) 100 each 1  .  losartan-hydrochlorothiazide (HYZAAR) 100-25 MG tablet Take 1 tablet by mouth daily. 90 tablet 3  . metFORMIN (GLUCOPHAGE-XR) 500 MG 24 hr tablet Take 1 tablet (500 mg total) by mouth daily with breakfast. 30 tablet 11  . ONETOUCH DELICA LANCETS 99991111 MISC Check blood sugar once daily and as directed. Dx E11.9 100 each 1  . potassium chloride SA (K-DUR,KLOR-CON) 20 MEQ tablet Take 1 tablet (20 mEq total) by mouth daily. 90 tablet 3   No current facility-administered medications for this visit.     Allergies  Allergen Reactions  . Bee Venom Hives, Shortness Of Breath and Swelling  . Nabumetone Anaphylaxis  . Amoxicillin-Pot Clavulanate Hives and Swelling    To lips.  . Aspirin Hives  . Atorvastatin Swelling     joint pain/swelling, inc liver tests  . Clopidogrel Bisulfate Hives  . Codeine Nausea And Vomiting  . Ezetimibe Other (See Comments)     fatigue  . Metformin And Related Other (See Comments)    Diarrhea   . Valsartan Other (See Comments)     fatigue  . Other Hives    **Red Meat**  SOB    Review of Systems negative except from HPI and PMH  Physical Exam BP 138/70   Pulse 71   Ht '5\' 8"'$  (1.727 m)   Wt 201 lb 9.6 oz (91.4 kg)   BMI 30.65 kg/m  Well developed and nourished in no acute distress HENT normal Neck supple with JVP-flat Clear Regular rate and rhythm, no murmurs or gallops Abd-soft with active BS No Clubbing cyanosis edema Skin-warm and dry A & Oriented  Grossly normal sensory and motor function    ECG  Personally reviewed   Sinus rhythm at 57 Intervals 15/10/44  Assessment and  Plan  Cryptogenic stroke  Implantable loop recorder  Elevated blood pressure  Hyperlipidemia  Carotid disease   Her symptoms are concerning for progressive coronary disease with exertional chest discomfort and dyspnea.  7 years ago she had coronary disease nonobstructive present.  Details are not available.  She poorly tolerated and adenosine Myoview; we will proceed  with CT scanning and FFR.  We spent more than 50% of our >25 min visit in face to face counseling regarding the above

## 2017-10-29 NOTE — Patient Instructions (Addendum)
Medication Instructions:  Your physician recommends that you continue on your current medications as directed. Please refer to the Current Medication list given to you today.  Labwork: None ordered.  Testing/Procedures: Non-Cardiac CT Angiography (CTA), is a special type of CT scan that uses a computer to produce multi-dimensional views of major blood vessels throughout the body. In CT angiography, a contrast material is injected through an IV to help visualize the blood vessels  Follow-Up: Your physician recommends that you schedule a follow-up appointment as needed.  Any Other Special Instructions Will Be Listed Below (If Applicable).  Please arrive at the [facility] main entrance of [facility] at ___________ (30-45 minutes prior to test start time)  [facility] [place], [place] [id number] [phone]  Proceed to the [facility] Radiology Department (First Floor).  Please follow these instructions carefully (unless otherwise directed):   On the Night Before the Test: . Drink plenty of water. . Do not consume any caffeinated/decaffeinated beverages or chocolate 12 hours prior to your test. . Do not take any antihistamines 12 hours prior to your test. . If you take Glipizide and Metformin, do not take 24 hours prior to test.  On the Day of the Test: . Drink plenty of water. Do not drink any water within one hour of the test. . Do not eat any food 4 hours prior to the test. . You may take your regular medications prior to the test. . IF NOT ON A BETA BLOCKER - Take 50 mg of lopressor (metoprolol) one hour before the test.   After the Test: . Drink plenty of water. . After receiving IV contrast, you may experience a mild flushed feeling. This is normal. . On occasion, you may experience a mild rash up to 24 hours after the test. This is not dangerous. If this occurs, you can take Benadryl 25 mg and increase your fluid intake. . If you  experience trouble breathing, this can be serious. If it is severe call 911 IMMEDIATELY. If it is mild, please call our office. . If you take any of these medications: Glipizide/Metformin, Avandament, Glucavance, please do not take 48 hours after completing test.     If you need a refill on your cardiac medications before your next appointment, please call your pharmacy.

## 2017-11-16 ENCOUNTER — Other Ambulatory Visit: Payer: Self-pay

## 2017-11-16 DIAGNOSIS — R079 Chest pain, unspecified: Secondary | ICD-10-CM

## 2017-12-01 ENCOUNTER — Telehealth: Payer: Self-pay | Admitting: Family Medicine

## 2017-12-01 DIAGNOSIS — E1151 Type 2 diabetes mellitus with diabetic peripheral angiopathy without gangrene: Secondary | ICD-10-CM

## 2017-12-01 DIAGNOSIS — I10 Essential (primary) hypertension: Secondary | ICD-10-CM

## 2017-12-01 NOTE — Telephone Encounter (Signed)
-----   Message from Ellamae Sia sent at 11/23/2017 10:57 AM EDT ----- Regarding: Lab orders for Thursday, 5.30.19 Lab orders for a 3 month follow up appt.

## 2017-12-02 ENCOUNTER — Other Ambulatory Visit: Payer: PPO | Admitting: *Deleted

## 2017-12-02 DIAGNOSIS — R079 Chest pain, unspecified: Secondary | ICD-10-CM

## 2017-12-03 ENCOUNTER — Other Ambulatory Visit: Payer: PPO

## 2017-12-03 LAB — BASIC METABOLIC PANEL
BUN/Creatinine Ratio: 14 (ref 12–28)
BUN: 14 mg/dL (ref 8–27)
CO2: 22 mmol/L (ref 20–29)
Calcium: 9 mg/dL (ref 8.7–10.3)
Chloride: 104 mmol/L (ref 96–106)
Creatinine, Ser: 0.98 mg/dL (ref 0.57–1.00)
GFR calc Af Amer: 63 mL/min/{1.73_m2} (ref >59)
GFR calc non Af Amer: 55 mL/min/{1.73_m2} — ABNORMAL LOW (ref >59)
Glucose: 130 mg/dL — ABNORMAL HIGH (ref 65–99)
Potassium: 4.1 mmol/L (ref 3.5–5.2)
Sodium: 141 mmol/L (ref 134–144)

## 2017-12-07 DIAGNOSIS — E119 Type 2 diabetes mellitus without complications: Secondary | ICD-10-CM | POA: Diagnosis not present

## 2017-12-07 LAB — HM DIABETES EYE EXAM

## 2017-12-09 ENCOUNTER — Ambulatory Visit: Payer: PPO | Admitting: Family Medicine

## 2017-12-09 ENCOUNTER — Ambulatory Visit (HOSPITAL_COMMUNITY)
Admission: RE | Admit: 2017-12-09 | Discharge: 2017-12-09 | Disposition: A | Discharge status: Home / Self Care | Payer: PPO | Source: Ambulatory Visit | Attending: Internal Medicine | Admitting: Internal Medicine

## 2017-12-09 ENCOUNTER — Encounter (HOSPITAL_COMMUNITY): Payer: Self-pay

## 2017-12-09 DIAGNOSIS — I288 Other diseases of pulmonary vessels: Secondary | ICD-10-CM | POA: Insufficient documentation

## 2017-12-09 DIAGNOSIS — I2510 Atherosclerotic heart disease of native coronary artery without angina pectoris: Secondary | ICD-10-CM | POA: Diagnosis not present

## 2017-12-09 DIAGNOSIS — R079 Chest pain, unspecified: Secondary | ICD-10-CM | POA: Diagnosis not present

## 2017-12-09 DIAGNOSIS — R0609 Other forms of dyspnea: Secondary | ICD-10-CM

## 2017-12-09 DIAGNOSIS — R0600 Dyspnea, unspecified: Secondary | ICD-10-CM

## 2017-12-09 MED ORDER — NITROGLYCERIN 0.4 MG SL SUBL
SUBLINGUAL_TABLET | SUBLINGUAL | Status: AC
  Administered 2017-12-09: 0.8 mg via SUBLINGUAL
  Filled 2017-12-09: qty 2

## 2017-12-09 MED ORDER — IOPAMIDOL (ISOVUE-370) INJECTION 76%
INTRAVENOUS | Status: AC
  Administered 2017-12-09: 80 mL
  Filled 2017-12-09: qty 100

## 2017-12-09 MED ORDER — NITROGLYCERIN 0.4 MG SL SUBL
0.8000 mg | SUBLINGUAL_TABLET | Freq: Once | SUBLINGUAL | Status: AC
  Administered 2017-12-09: 0.8 mg via SUBLINGUAL
  Filled 2017-12-09: qty 25

## 2017-12-09 NOTE — Progress Notes (Signed)
Patient tolerated CT without incident and ate peanut butter crackers and drank a diet coke. Ambulated to waiting room steady gait.

## 2017-12-10 DIAGNOSIS — R0609 Other forms of dyspnea: Secondary | ICD-10-CM | POA: Diagnosis not present

## 2017-12-11 ENCOUNTER — Encounter: Payer: Self-pay | Admitting: Family Medicine

## 2017-12-11 ENCOUNTER — Ambulatory Visit (INDEPENDENT_AMBULATORY_CARE_PROVIDER_SITE_OTHER): Payer: PPO | Admitting: Family Medicine

## 2017-12-11 VITALS — BP 122/64 | HR 76 | Temp 97.9°F | Ht 68.0 in | Wt 199.2 lb

## 2017-12-11 DIAGNOSIS — I10 Essential (primary) hypertension: Secondary | ICD-10-CM | POA: Diagnosis not present

## 2017-12-11 DIAGNOSIS — E1151 Type 2 diabetes mellitus with diabetic peripheral angiopathy without gangrene: Secondary | ICD-10-CM | POA: Diagnosis not present

## 2017-12-11 LAB — POCT GLYCOSYLATED HEMOGLOBIN (HGB A1C): Hemoglobin A1C: 6.8 % — AB (ref 4.0–5.6)

## 2017-12-11 NOTE — Patient Instructions (Addendum)
Your A1C is improved   Keep working in [employer]  Continue the glipizide and the metformin xr   Use some heat on sore spot in your back - try some stretching   Follow up in 6 months   Take care of yourself

## 2017-12-11 NOTE — Progress Notes (Signed)
Subjective:    Patient ID: Margaret Vazquez, female    DOB: [birth year], 82 y.o.   MRN: [id number]  HPI Here for f/u of chronic health problems   Wt Readings from Last 3 Encounters:  12/11/17 199 lb 4 oz (90.4 kg)  10/29/17 201 lb 9.6 oz (91.4 kg)  10/15/17 198 lb 12 oz (90.2 kg)   30.30 kg/m   DM2 Lab Results  Component Value Date   HGBA1C 7.5 (H) 09/01/2017   Due for A1c today On glipizide (intol to metformin) -so added metformin xr (still some loose stool but much more tolerable)  Eye exam 6/19 -no retinopathy   Lab Results  Component Value Date   HGBA1C 6.8 (A) 12/11/2017  improved !    Blood sugars are better than they were before she started the metformin xr  Diet has been good  Has not tried any exercise yet due to fatigue/sob/pain   Feels better off the ticlid in general  Has more energy  pletal is better     bp is stable today  No cp or palpitations or headaches or edema  No side effects to medicines  BP Readings from Last 3 Encounters:  12/11/17 122/64  12/09/17 131/61  12/09/17 123/74     Lab Results  Component Value Date   CREATININE 0.98 12/02/2017   BUN 14 12/02/2017   NA 141 12/02/2017   K 4.1 12/02/2017   CL 104 12/02/2017   CO2 22 12/02/2017   Lab Results  Component Value Date   WBC 5.8 09/01/2017   HGB 13.7 09/01/2017   HCT 41.0 09/01/2017   MCV 96.9 09/01/2017   PLT 228.0 09/01/2017    Patient Active Problem List   Diagnosis Date Noted  . Chest pressure 10/15/2017  . Routine general medical examination at a health care facility 08/24/2016  . Paroxysmal atrial fibrillation ([place]) 04/07/2016  . Fall 08/13/2015  . Obesity (BMI 30-39.9) 08/23/2014  . Type 2 diabetes mellitus with diabetic peripheral angiopathy without gangrene, without long-term current use of insulin ([place]) 01/12/2014  . TIA (transient ischemic attack) 01/11/2014  . CVA (cerebral infarction) 01/11/2014  . LOOP Recorder LINQ 06/07/2013  . Fatigue 06/07/2013  .  Sinusitis, chronic 05/16/2013  . H/O: CVA (cerebrovascular accident) 02/15/2013  . Encounter for Medicare annual wellness exam 10/27/2012  . Encopresis(307.7) 12/15/2011  . Post-menopausal 10/28/2011  . PERSONAL HX COLONIC POLYPS 09/04/2009  . B12 deficiency 03/06/2009  . ALLERGIC RHINITIS 02/14/2008  . BACK PAIN, LUMBAR 11/03/2007  . Hyperlipidemia 11/24/2006  . Essential hypertension 11/24/2006  . GERD 10/09/2006  . OVERACTIVE BLADDER 10/09/2006  . INCONTINENCE, URGE 10/09/2006  . SKIN CANCER, HX OF 10/09/2006   Past Medical History:  Diagnosis Date  . Allergy history, drug    Aspirin  . CAD (coronary artery disease) 04/08/11   non obst by cath  . Cervical stenosis of spine    With neck pain  . Colon polyps 2009  . Complication of anesthesia    slow to wake after anesthesia in 1980s  . Diabetes mellitus    type II  . GERD (gastroesophageal reflux disease)   . Hyperlipidemia    myalgias with Lipitor and Zetia  . Hypertension   . Migraine   . Skin cancer    hx of basal cell/ak's  . Stroke [facility]) August 2014  . TIA (transient ischemic attack)    Past Surgical History:  Procedure Laterality Date  . ABD [place]  07/2003   Negative  .  APPENDECTOMY    . BREAST EXCISIONAL BIOPSY Left 1992   benign  . BREAST SURGERY  1992   breast biopsy  . CARDIAC CATHETERIZATION  04/07/2011   non obst CAD (Dr Burt Knack)  . CATARACT EXTRACTION W/PHACO Left 04/01/2017   Procedure: CATARACT EXTRACTION PHACO AND INTRAOCULAR LENS PLACEMENT ([place]) LEFT DIABETIC;  Surgeon: Leandrew Koyanagi, MD;  Location: [place];  Service: Ophthalmology;  Laterality: Left;  Diabetic - oral meds  . CATARACT EXTRACTION W/PHACO Right 04/29/2017   Procedure: CATARACT EXTRACTION PHACO AND INTRAOCULAR LENS PLACEMENT ([place]) RIGHT DIABETIC;  Surgeon: Leandrew Koyanagi, MD;  Location: [place];  Service: Ophthalmology;  Laterality: Right;  Diabetic - oral meds  . COLONOSCOPY  12/2007   Adenomatous  colon polyps  . CYSTOSCOPY W/ DECANNULATION  03/2000   Normal  . EYE SURGERY  2005   tear duct surgery  . LOOP RECORDER IMPLANT N/A 02/16/2013   Procedure: LOOP RECORDER IMPLANT;  Surgeon: Deboraha Sprang, MD;  Location: [facility] CATH LAB;  Service: Cardiovascular;  Laterality: N/A;  . NASAL SINUS SURGERY  01/2005  . STRESS CARDIOLITE  11/1999   Normal/ negative  . [place] DUCT SURGERY  2005  . TEE WITHOUT CARDIOVERSION N/A 02/16/2013   Procedure: TRANSESOPHAGEAL ECHOCARDIOGRAM (TEE);  Surgeon: Josue Hector, MD;  Location: [facility] ENDOSCOPY;  Service: Cardiovascular;  Laterality: N/A;  . TUBAL LIGATION     BTL   Social History   Tobacco Use  . Smoking status: Never Smoker  . Smokeless tobacco: Never Used  Substance Use Topics  . Alcohol use: No    Alcohol/week: 0.0 oz  . Drug use: No   Family History  Problem Relation Age of Onset  . Lung cancer Brother   . Diabetes Brother   . Pancreatic cancer Brother   . Heart disease Mother   . Heart disease Father   . Brain cancer Other   . Skin cancer Daughter   . Diabetes Sister   . Colon cancer Neg Hx    Allergies  Allergen Reactions  . Bee Venom Hives, Shortness Of Breath and Swelling  . Nabumetone Anaphylaxis  . Amoxicillin-Pot Clavulanate Hives and Swelling    To lips.  . Aspirin Hives  . Atorvastatin Swelling     joint pain/swelling, inc liver tests  . Clopidogrel Bisulfate Hives  . Codeine Nausea And Vomiting  . Ezetimibe Other (See Comments)     fatigue  . Metformin And Related Other (See Comments)    Diarrhea   . Valsartan Other (See Comments)     fatigue  . Other Hives    **Red Meat**  SOB   Current Outpatient Medications on File Prior to Visit  Medication Sig Dispense Refill  . diphenhydrAMINE (BENADRYL) 25 MG tablet Take 50 mg by mouth every 8 (eight) hours as needed for allergies.     Marland Kitchen EPINEPHrine (EPI-PEN) 0.3 mg/0.3 mL DEVI Inject 0.3 mg into the muscle daily as needed (allergic reaction).     . fluticasone  (FLONASE) 50 MCG/ACT nasal spray Place 2 sprays into both nostrils 2 (two) times daily as needed. Reported on 09/10/2015    . glipiZIDE (GLUCOTROL XL) 10 MG 24 hr tablet Take 1 tablet (10 mg total) by mouth daily with breakfast. 90 tablet 3  . glucose blood (ONE TOUCH ULTRA TEST) test strip CHECK BLOOD SUGAR ONCE DAILY AND AS NEEDED (DX. E11.9) 100 each 1  . losartan-hydrochlorothiazide (HYZAAR) 100-25 MG tablet Take 1 tablet by mouth daily. 90 tablet 3  .  metFORMIN (GLUCOPHAGE-XR) 500 MG 24 hr tablet Take 1 tablet (500 mg total) by mouth daily with breakfast. 30 tablet 11  . ONETOUCH DELICA LANCETS 99991111 MISC Check blood sugar once daily and as directed. Dx E11.9 100 each 1  . potassium chloride SA (K-DUR,KLOR-CON) 20 MEQ tablet Take 1 tablet (20 mEq total) by mouth daily. 90 tablet 3  . metoprolol tartrate (LOPRESSOR) 50 MG tablet Take 1 tablet (50 mg total) by mouth once for 1 dose. Take one hour before cardiac CT 1 tablet 0   No current facility-administered medications on file prior to visit.     Review of Systems  Constitutional: Negative for activity change, appetite change, fatigue, fever and unexpected weight change.  HENT: Negative for congestion, ear pain, rhinorrhea, sinus pressure and sore throat.   Eyes: Negative for pain, redness and visual disturbance.  Respiratory: Negative for cough, shortness of breath and wheezing.   Cardiovascular: Negative for chest pain and palpitations.  Gastrointestinal: Negative for abdominal pain, blood in stool, constipation and diarrhea.  Endocrine: Negative for polydipsia and polyuria.  Genitourinary: Negative for dysuria, frequency and urgency.  Musculoskeletal: Positive for back pain. Negative for arthralgias and myalgias.  Skin: Negative for pallor and rash.  Allergic/Immunologic: Negative for environmental allergies.  Neurological: Negative for dizziness, syncope and headaches.  Hematological: Negative for adenopathy. Does not bruise/bleed  easily.  Psychiatric/Behavioral: Negative for decreased concentration and dysphoric mood. The patient is not nervous/anxious.        Objective:   Physical Exam  Constitutional: She appears well-developed and well-nourished. No distress.  obese and well appearing   HENT:  Head: Normocephalic and atraumatic.  Mouth/Throat: Oropharynx is clear and moist.  Eyes: Pupils are equal, round, and reactive to light. Conjunctivae and EOM are normal.  Neck: Normal range of motion. Neck supple. No JVD present. Carotid bruit is not present. No thyromegaly present.  Cardiovascular: Normal rate, regular rhythm, normal heart sounds and intact distal pulses. Exam reveals no gallop.  Pulmonary/Chest: Effort normal and breath sounds normal. No respiratory distress. She has no wheezes. She has no rales.  No crackles  Abdominal: Soft. Bowel sounds are normal. She exhibits no distension, no abdominal bruit and no mass. There is no tenderness.  Musculoskeletal: She exhibits no edema.  Lymphadenopathy:    She has no cervical adenopathy.  Neurological: She is alert. She has normal reflexes.  Skin: Skin is warm and dry. No rash noted.  Psychiatric: She has a normal mood and affect.          Assessment & Plan:   Problem List Items Addressed This Visit      Cardiovascular and Mediastinum   Essential hypertension    bp in fair control at this time  BP Readings from Last 1 Encounters:  12/11/17 122/64   No changes needed Most recent labs reviewed  Disc lifstyle change with low sodium diet and exercise        Type 2 diabetes mellitus with diabetic peripheral angiopathy without gangrene, without long-term current use of insulin ([place]) - Primary    Lab Results  Component Value Date   HGBA1C 6.8 (A) 12/11/2017   Improved with addn of low dose metformin xr which she is tolerating  Eye and foot care reviewed       Relevant Orders   POCT A1C (Completed)

## 2017-12-13 NOTE — Assessment & Plan Note (Signed)
Lab Results  Component Value Date   HGBA1C 6.8 (A) 12/11/2017   Improved with addn of low dose metformin xr which she is tolerating  Eye and foot care reviewed

## 2017-12-13 NOTE — Assessment & Plan Note (Signed)
bp in fair control at this time  BP Readings from Last 1 Encounters:  12/11/17 122/64   No changes needed Most recent labs reviewed  Disc lifstyle change with low sodium diet and exercise

## 2017-12-14 ENCOUNTER — Telehealth: Payer: Self-pay | Admitting: Internal Medicine

## 2017-12-14 NOTE — Telephone Encounter (Signed)
Notified pt of coronary CT results per Notes recorded by Deboraha Sprang, MD on 12/13/2017 at 1:35 PM EDT Please Inform Patient that CT A  is abnormal and will require further eval  ths should be pursued with cath given high degree of stenosis anticipated in the LAD  Thanks  I informed pt I will forward to Dr. Olin Pia nurse and she will give you a call back this week with additional instructions. She stated understanding and thankful for the call

## 2017-12-14 NOTE — Telephone Encounter (Signed)
Pt calling and want to know results of her CT she had done 6.5.19

## 2017-12-15 NOTE — Telephone Encounter (Signed)
Pt aware she will be contacted by scheduling for an appt to be evaluated for a heart cath.

## 2017-12-15 NOTE — Telephone Encounter (Signed)
LVM for return call. 

## 2017-12-22 ENCOUNTER — Ambulatory Visit (INDEPENDENT_AMBULATORY_CARE_PROVIDER_SITE_OTHER): Payer: PPO | Admitting: Physician Assistant

## 2017-12-22 ENCOUNTER — Encounter: Payer: Self-pay | Admitting: Physician Assistant

## 2017-12-22 VITALS — BP 130/62 | HR 78 | Ht 68.0 in | Wt 195.0 lb

## 2017-12-22 DIAGNOSIS — R079 Chest pain, unspecified: Secondary | ICD-10-CM

## 2017-12-22 DIAGNOSIS — R931 Abnormal findings on diagnostic imaging of heart and coronary circulation: Secondary | ICD-10-CM | POA: Diagnosis not present

## 2017-12-22 DIAGNOSIS — E119 Type 2 diabetes mellitus without complications: Secondary | ICD-10-CM | POA: Diagnosis not present

## 2017-12-22 DIAGNOSIS — I25119 Atherosclerotic heart disease of native coronary artery with unspecified angina pectoris: Secondary | ICD-10-CM

## 2017-12-22 DIAGNOSIS — Z01812 Encounter for preprocedural laboratory examination: Secondary | ICD-10-CM | POA: Diagnosis not present

## 2017-12-22 DIAGNOSIS — Z8673 Personal history of transient ischemic attack (TIA), and cerebral infarction without residual deficits: Secondary | ICD-10-CM

## 2017-12-22 DIAGNOSIS — I10 Essential (primary) hypertension: Secondary | ICD-10-CM | POA: Diagnosis not present

## 2017-12-22 DIAGNOSIS — I480 Paroxysmal atrial fibrillation: Secondary | ICD-10-CM | POA: Diagnosis not present

## 2017-12-22 NOTE — Progress Notes (Signed)
Cardiology Office Note    Date:  12/22/2017   ID:  Margaret Vazquez, DOB 1936-03-03, MRN [id number]  PCP:  Abner Greenspan, MD  Cardiologist:  Dr. Caryl Comes  Chief Complaint  Patient presents with  . Follow-up    Eval for heart cath. Seen for Dr. Caryl Comes    History of Present Illness:  Margaret Vazquez is a 82 y.o. female with PMH of CAD, DM II, HLD intolerant to Lipitor and Zetia, hypertension and history of CVA status post loop recorder.  Previous cardiac catheterization in 2012 showed very mild nonobstructive disease.  Patient had a loop recorder placed previously for cryptogenic stroke, this revealed atrial fibrillation.  This episode of atrial fibrillation occurred in the context of treatment of anaphylaxis with epinephrine.  After discussing with Dr. Leonie Man of neurology service, it was suggested there is no indication for anticoagulation for solitary episode. She has been followed by Dr. Caryl Comes.  During the recent office visit in April 2019, she complained of exertional shortness of breath and a chest discomfort that has been ongoing for the past several months.  She says she would noticed a chest tightness after walking around for 15 minutes prompting her to stop.  She is currently not on aspirin given history of allergy.  She also cannot take Plavix either due to hives.  She eventually underwent coronary CT which showed potential obstructive disease in the proximal to mid LAD and also diagonal area with abnormal FFR.  On the initial coronary CT, there was also mention of severe left circumflex disease, however FFR of this area was normal.  Patient presents today to general cardiology service for consideration of an outpatient cardiac catheterization.  I reviewed the recent coronary CT with Dr. Gwenlyn Found DOD, given the her symptom, we recommended cardiac catheterization.  Benefit and risk of the procedure has been discussed with the patient who displayed clear understanding and agreed to proceed.  I will  obtain CBC, basic metabolic panel prior to the cardiac catheterization.  Since she is unable to take the aspirin and Plavix, and with prior history of stroke, she cannot take Effient either, therefore only antiplatelet medication she can potentially take is Brilinta.  May need to consider bare-metal stenting instead of drug-eluting stent if she does require intervention    Past Medical History:  Diagnosis Date  . Allergy history, drug    Aspirin  . CAD (coronary artery disease) 04/08/11   non obst by cath  . Cervical stenosis of spine    With neck pain  . Colon polyps 2009  . Complication of anesthesia    slow to wake after anesthesia in 1980s  . Diabetes mellitus    type II  . GERD (gastroesophageal reflux disease)   . Hyperlipidemia    myalgias with Lipitor and Zetia  . Hypertension   . Migraine   . Skin cancer    hx of basal cell/ak's  . Stroke [facility]) August 2014  . TIA (transient ischemic attack)     Past Surgical History:  Procedure Laterality Date  . ABD [place]  07/2003   Negative  . APPENDECTOMY    . BREAST EXCISIONAL BIOPSY Left 1992   benign  . BREAST SURGERY  1992   breast biopsy  . CARDIAC CATHETERIZATION  04/07/2011   non obst CAD (Dr Burt Knack)  . CATARACT EXTRACTION W/PHACO Left 04/01/2017   Procedure: CATARACT EXTRACTION PHACO AND INTRAOCULAR LENS PLACEMENT ([place]) LEFT DIABETIC;  Surgeon: Leandrew Koyanagi, MD;  Location: [facility];  Service: Ophthalmology;  Laterality: Left;  Diabetic - oral meds  . CATARACT EXTRACTION W/PHACO Right 04/29/2017   Procedure: CATARACT EXTRACTION PHACO AND INTRAOCULAR LENS PLACEMENT ([place]) RIGHT DIABETIC;  Surgeon: Leandrew Koyanagi, MD;  Location: [facility];  Service: Ophthalmology;  Laterality: Right;  Diabetic - oral meds  . COLONOSCOPY  12/2007   Adenomatous colon polyps  . CYSTOSCOPY W/ DECANNULATION  03/2000   Normal  . EYE SURGERY  2005   tear duct surgery  . LOOP RECORDER IMPLANT N/A 02/16/2013    Procedure: LOOP RECORDER IMPLANT;  Surgeon: Deboraha Sprang, MD;  Location: [facility] CATH LAB;  Service: Cardiovascular;  Laterality: N/A;  . NASAL SINUS SURGERY  01/2005  . STRESS CARDIOLITE  11/1999   Normal/ negative  . [place] DUCT SURGERY  2005  . TEE WITHOUT CARDIOVERSION N/A 02/16/2013   Procedure: TRANSESOPHAGEAL ECHOCARDIOGRAM (TEE);  Surgeon: Josue Hector, MD;  Location: [facility] ENDOSCOPY;  Service: Cardiovascular;  Laterality: N/A;  . TUBAL LIGATION     BTL    Current Medications: Outpatient Medications Prior to Visit  Medication Sig Dispense Refill  . diphenhydrAMINE (BENADRYL) 25 MG tablet Take 50 mg by mouth every 8 (eight) hours as needed for allergies.     Marland Kitchen EPINEPHrine (EPI-PEN) 0.3 mg/0.3 mL DEVI Inject 0.3 mg into the muscle daily as needed (allergic reaction).     . fluticasone (FLONASE) 50 MCG/ACT nasal spray Place 2 sprays into both nostrils 2 (two) times daily as needed. Reported on 09/10/2015    . glipiZIDE (GLUCOTROL XL) 10 MG 24 hr tablet Take 1 tablet (10 mg total) by mouth daily with breakfast. 90 tablet 3  . glucose blood (ONE TOUCH ULTRA TEST) test strip CHECK BLOOD SUGAR ONCE DAILY AND AS NEEDED (DX. E11.9) 100 each 1  . losartan-hydrochlorothiazide (HYZAAR) 100-25 MG tablet Take 1 tablet by mouth daily. 90 tablet 3  . metFORMIN (GLUCOPHAGE-XR) 500 MG 24 hr tablet Take 1 tablet (500 mg total) by mouth daily with breakfast. 30 tablet 11  . metoprolol tartrate (LOPRESSOR) 50 MG tablet Take 1 tablet (50 mg total) by mouth once for 1 dose. Take one hour before cardiac CT 1 tablet 0  . ONETOUCH DELICA LANCETS 76E MISC Check blood sugar once daily and as directed. Dx E11.9 100 each 1  . potassium chloride SA (K-DUR,KLOR-CON) 20 MEQ tablet Take 1 tablet (20 mEq total) by mouth daily. 90 tablet 3   No facility-administered medications prior to visit.      Allergies:   Bee venom; Nabumetone; Amoxicillin-pot clavulanate; Aspirin; Atorvastatin; Clopidogrel bisulfate; Codeine;  Ezetimibe; Metformin and related; Valsartan; and Other   Social History   Socioeconomic History  . Marital status: Married    Spouse name: Eddie Dibbles  . Number of children: 3  . Years of education: 48  . Highest education level: Not on file  Occupational History  . Occupation: Retired    Fish farm manager: RETIRED  Social Needs  . Financial resource strain: Not on file  . Food insecurity:    Worry: Not on file    Inability: Not on file  . Transportation needs:    Medical: Not on file    Non-medical: Not on file  Tobacco Use  . Smoking status: Never Smoker  . Smokeless tobacco: Never Used  Substance and Sexual Activity  . Alcohol use: No    Alcohol/week: 0.0 oz  . Drug use: No  . Sexual activity: Never  Lifestyle  . Physical activity:  Days per week: Not on file    Minutes per session: Not on file  . Stress: Not on file  Relationships  . Social connections:    Talks on phone: Not on file    Gets together: Not on file    Attends religious service: Not on file    Active member of club or organization: Not on file    Attends meetings of clubs or organizations: Not on file    Relationship status: Not on file  Other Topics Concern  . Not on file  Social History Narrative   3 children   Does not drink caffeinated beverages   Cares for SIL with dementia     Family History:  The patient's family history includes Brain cancer in her other; Diabetes in her brother and sister; Heart disease in her father and mother; Lung cancer in her brother; Pancreatic cancer in her brother; Skin cancer in her daughter.   ROS:   Please see the history of present illness.    ROS All other systems reviewed and are negative.   PHYSICAL EXAM:   VS:  BP 130/62 (BP Location: Left Arm, Patient Position: Sitting, Cuff Size: Large)   Pulse 78   Ht 5\' 8"  (1.727 m)   Wt 195 lb (88.5 kg)   BMI 29.65 kg/m    GEN: Well nourished, well developed, in no acute distress  HEENT: normal  Neck: no JVD, carotid  bruits, or masses Cardiac: RRR; no murmurs, rubs, or gallops,no edema  Respiratory:  clear to auscultation bilaterally, normal work of breathing GI: soft, nontender, nondistended, + BS MS: no deformity or atrophy  Skin: warm and dry, no rash Neuro:  Alert and Oriented x 3, Strength and sensation are intact Psych: euthymic mood, full affect  Wt Readings from Last 3 Encounters:  12/22/17 195 lb (88.5 kg)  12/11/17 199 lb 4 oz (90.4 kg)  10/29/17 201 lb 9.6 oz (91.4 kg)      Studies/Labs Reviewed:   EKG:  EKG is ordered today.  The ekg ordered today demonstrates normal sinus rhythm, no significant ST-T wave changes  Recent Labs: 09/01/2017: ALT 13; Hemoglobin 13.7; Platelets 228.0; TSH 4.00 12/02/2017: BUN 14; Creatinine, Ser 0.98; Potassium 4.1; Sodium 141   Lipid Panel    Component Value Date/Time   CHOL 200 09/01/2017 1002   TRIG 188.0 (H) 09/01/2017 1002   HDL 48.80 09/01/2017 1002   CHOLHDL 4 09/01/2017 1002   VLDL 37.6 09/01/2017 1002   LDLCALC 114 (H) 09/01/2017 1002   LDLDIRECT 107.0 08/27/2016 0948    Additional studies/ records that were reviewed today include:   Cath 04/09/2011 PROCEDURAL FINDINGS:  Aortic pressure is 157/83 with a mean of 115.  CORONARY ANGIOGRAPHY:  The left main is patent.  There is no obstructive disease.  It divides into the LAD and left circumflex.  LAD:  The LAD has mild nonobstructive stenosis throughout the proximal vessel.  There is 20-30% luminal narrowing.  The vessel is essentially widely patent and gives off a tiny first diagonal and a moderate caliber second diagonal branch.  LEFT CIRCUMFLEX:  There is a small to moderate caliber intermediate branch with no significant stenosis.  The AV groove circumflex has a 50% mid stenosis.  It supplies a single OM branch.  RIGHT CORONARY ARTERY:  This is a large, dominant vessel.  There is no obstructive disease throughout the proximal mid or distal RCA.  There are luminal  irregularities with up to 30% stenosis.  The vessel gives off a large PDA branch and 3 posterolateral branches.  There is mild distal vessel disease noted, but there are no areas of critical stenosis anywhere.  FINAL ASSESSMENT:  Mild diffuse nonobstructive coronary artery disease as described.  RECOMMENDATIONS:  The patient should be treated with medical therapy with focus on control of her blood pressure.  She is allergic to both aspirin and Plavix and antiplatelet therapy is probably not necessary.   Coronary CT 12/09/2017 IMPRESSION: 1. Coronary calcium score of 357. This was 2 percentile for age and sex matched control.  2. Normal coronary origin with right dominance.  3. Severe stenosis in the proximal non-dominant LCX artery, mild stenosis in the distal left main, moderate stenosis in the proximal to mid LAD and proximal first diagonal artery and possible severe stenosis in the proximal ramus intermedius. Additional analysis with CT FFR will be submitted.  4. Mildly dilated pulmonary artery measuring 34 mm suggestive of pulmonary hypertension.  1. Left Main:  No significant stenosis.  2. LAD: Proximal CT FFR: 0.86, mid LAD CT FFR: 0.79. 3. Diagonal artery: 0.77. 4. LCX: No significant stenosis. 5. Ramus intermedius: CT FFR: 086. 6. RCA: No significant stenosis.  IMPRESSION: 1. CT FFR analysis showed hemodynamically significant stenosis in the proximal to mid LAD and first diagonal artery. Cardiac catheterization is recommended.  ASSESSMENT:    1. Abnormal cardiac CT angiography   2. Pre-procedure lab exam   3. Exertional chest pain   4. Coronary artery disease involving native coronary artery of native heart with angina pectoris ([place])   5. Controlled type 2 diabetes mellitus without complication, without long-term current use of insulin ([place])   6. Essential hypertension   7. H/O: CVA (cerebrovascular accident)   8. PAF (paroxysmal atrial fibrillation)  (HCC)      PLAN:  In order of problems listed above:  1. Abnormal coronary CT: Coronary CT was ordered for ischemic work-up given recent exertional chest discomfort, this showed possible significant blockage in the proximal to mid LAD territory involving the diagonal, and questionable left circumflex lesion.  - Risk and benefit of procedure explained to the patient who display clear understanding and agree to proceed.  Discussed with patient possible procedural risk include bleeding, vascular injury, renal injury, arrythmia, MI, stroke and loss of limb or life.  -Patient is allergic to both aspirin and Plavix, she cannot take Effient due to a prior history of stroke, only antiplatelet medication available would be Brilinta.  May need to consider bare-metal stent to shorten the duration of therapy.  -Instructed the patient to stop taking Pletal.  In fact this is a medication that is already been stopped by her PCP, however she has been taking it.  2. CAD: Previous cardiac catheterization in 2012 showed a nonobstructive disease  3. Hypertension: Blood pressure stable  4. PAF: Solitary episode occurred in the setting of epinephrine use for anaphylaxis, no plan for anticoagulation therapy  5. History of CVA: Followed by neurology, cryptogenic stroke  6. DM2: Managed by primary care provider    Medication Adjustments/Labs and Tests Ordered: Current medicines are reviewed at length with the patient today.  Concerns regarding medicines are outlined above.  Medication changes, Labs and Tests ordered today are listed in the Patient Instructions below. Patient Instructions  Medication Instructions: Stop taking: Pletal   If you need a refill on your cardiac medications before your next appointment, please call your pharmacy.   Labwork: Your physician recommends that you return for lab  work in: Today (CBC/BMP)   Procedures/Testing: Your physician has requested that you have a cardiac  catheterization. Cardiac catheterization is used to diagnose and/or treat various heart conditions. Doctors may recommend this procedure for a number of different reasons. The most common reason is to evaluate chest pain. Chest pain can be a symptom of coronary artery disease (CAD), and cardiac catheterization can show whether plaque is narrowing or blocking your heart's arteries. This procedure is also used to evaluate the valves, as well as measure the blood flow and oxygen levels in different parts of your heart. For further information please visit [email or web address]. Please follow instruction sheet, as given.     Follow-Up: Your physician wants you to follow-up in 3-4 weeks Almyra Deforest, [place]  Special Instructions:   [place] [address] [place] [address] Dept: [phone] Loc: [place]  12/22/2017  You are scheduled for a Cardiac Catheterization on Thursday, June 20 with Dr. Quay Burow.  1. Please arrive at the [facility] (Main Entrance A) at [facility]: [address] [place], [place] [address] at 8:00 AM (two hours before your procedure to ensure your preparation). Free valet parking service is available.   Special note: Every effort is made to have your procedure done on time. Please understand that emergencies sometimes delay scheduled procedures.  2. Diet: Do not eat or drink anything after midnight prior to your procedure except sips of water to take medications.  3. Labs: Today  4. Medication instructions in preparation for your procedure:   Hold Metformin, Glipizide, Hyzaar, and Potassium the morning of procedure.  Hold Metformin 48 hours after procedure    On the morning of your procedure, take any morning medicines NOT listed above.  You may use sips of water.  5. Plan for one night stay--bring personal belongings. 6. Bring a current  list of your medications and current insurance cards. 7. You MUST have a responsible person to drive you home. 8. Someone MUST be with you the first 24 hours after you arrive home or your discharge will be delayed. 9. Please wear clothes that are easy to get on and off and wear slip-on shoes.  Thank you for allowing [place] to care for you!   -- [place] [facility]    Thank you for choosing Heartcare at [facility]!!       Signed, Almyra Deforest, [place]  12/22/2017 4:57 PM    [place], [place], [place]  [address] Phone: [phone]; Fax: [phone]

## 2017-12-22 NOTE — H&P (View-Only) (Signed)
Cardiology Office Note    Date:  12/22/2017   ID:  Margaret Vazquez, DOB 09-08-1935, MRN [id number]  PCP:  Abner Greenspan, MD  Cardiologist:  Dr. Caryl Comes  Chief Complaint  Patient presents with  . Follow-up    Eval for heart cath. Seen for Dr. Caryl Comes    History of Present Illness:  Margaret Vazquez is a 82 y.o. female with PMH of CAD, DM II, HLD intolerant to Lipitor and Zetia, hypertension and history of CVA status post loop recorder.  Previous cardiac catheterization in 2012 showed very mild nonobstructive disease.  Patient had a loop recorder placed previously for cryptogenic stroke, this revealed atrial fibrillation.  This episode of atrial fibrillation occurred in the context of treatment of anaphylaxis with epinephrine.  After discussing with Dr. Leonie Man of neurology service, it was suggested there is no indication for anticoagulation for solitary episode. She has been followed by Dr. Caryl Comes.  During the recent office visit in April 2019, she complained of exertional shortness of breath and a chest discomfort that has been ongoing for the past several months.  She says she would noticed a chest tightness after walking around for 15 minutes prompting her to stop.  She is currently not on aspirin given history of allergy.  She also cannot take Plavix either due to hives.  She eventually underwent coronary CT which showed potential obstructive disease in the proximal to mid LAD and also diagonal area with abnormal FFR.  On the initial coronary CT, there was also mention of severe left circumflex disease, however FFR of this area was normal.  Patient presents today to general cardiology service for consideration of an outpatient cardiac catheterization.  I reviewed the recent coronary CT with Dr. Gwenlyn Found DOD, given the her symptom, we recommended cardiac catheterization.  Benefit and risk of the procedure has been discussed with the patient who displayed clear understanding and agreed to proceed.  I will  obtain CBC, basic metabolic panel prior to the cardiac catheterization.  Since she is unable to take the aspirin and Plavix, and with prior history of stroke, she cannot take Effient either, therefore only antiplatelet medication she can potentially take is Brilinta.  May need to consider bare-metal stenting instead of drug-eluting stent if she does require intervention    Past Medical History:  Diagnosis Date  . Allergy history, drug    Aspirin  . CAD (coronary artery disease) 04/08/11   non obst by cath  . Cervical stenosis of spine    With neck pain  . Colon polyps 2009  . Complication of anesthesia    slow to wake after anesthesia in 1980s  . Diabetes mellitus    type II  . GERD (gastroesophageal reflux disease)   . Hyperlipidemia    myalgias with Lipitor and Zetia  . Hypertension   . Migraine   . Skin cancer    hx of basal cell/ak's  . Stroke [facility]) August 2014  . TIA (transient ischemic attack)     Past Surgical History:  Procedure Laterality Date  . ABD [place]  07/2003   Negative  . APPENDECTOMY    . BREAST EXCISIONAL BIOPSY Left 1992   benign  . BREAST SURGERY  1992   breast biopsy  . CARDIAC CATHETERIZATION  04/07/2011   non obst CAD (Dr Burt Knack)  . CATARACT EXTRACTION W/PHACO Left 04/01/2017   Procedure: CATARACT EXTRACTION PHACO AND INTRAOCULAR LENS PLACEMENT ([place]) LEFT DIABETIC;  Surgeon: Leandrew Koyanagi, MD;  Location: [facility]  SURGERY CN[facility];  Service: Ophthalmology;  Laterality: Left;  Diabetic - oral meds  . CATARACT EXTRACTION W/PHACO Right 04/29/2017   Procedure: CATARACT EXTRACTION PHACO AND INTRAOCULAR LENS PLACEMENT ([place]) RIGHT DIABETIC;  Surgeon: Leandrew Koyanagi, MD;  Location: [facility];  Service: Ophthalmology;  Laterality: Right;  Diabetic - oral meds  . COLONOSCOPY  12/2007   Adenomatous colon polyps  . CYSTOSCOPY W/ DECANNULATION  03/2000   Normal  . EYE SURGERY  2005   tear duct surgery  . LOOP RECORDER IMPLANT N/A 02/16/2013    Procedure: LOOP RECORDER IMPLANT;  Surgeon: Deboraha Sprang, MD;  Location: [facility] CATH LAB;  Service: Cardiovascular;  Laterality: N/A;  . NASAL SINUS SURGERY  01/2005  . STRESS CARDIOLITE  11/1999   Normal/ negative  . [place] DUCT SURGERY  2005  . TEE WITHOUT CARDIOVERSION N/A 02/16/2013   Procedure: TRANSESOPHAGEAL ECHOCARDIOGRAM (TEE);  Surgeon: Josue Hector, MD;  Location: [facility] ENDOSCOPY;  Service: Cardiovascular;  Laterality: N/A;  . TUBAL LIGATION     BTL    Current Medications: Outpatient Medications Prior to Visit  Medication Sig Dispense Refill  . diphenhydrAMINE (BENADRYL) 25 MG tablet Take 50 mg by mouth every 8 (eight) hours as needed for allergies.     Marland Kitchen EPINEPHrine (EPI-PEN) 0.3 mg/0.3 mL DEVI Inject 0.3 mg into the muscle daily as needed (allergic reaction).     . fluticasone (FLONASE) 50 MCG/ACT nasal spray Place 2 sprays into both nostrils 2 (two) times daily as needed. Reported on 09/10/2015    . glipiZIDE (GLUCOTROL XL) 10 MG 24 hr tablet Take 1 tablet (10 mg total) by mouth daily with breakfast. 90 tablet 3  . glucose blood (ONE TOUCH ULTRA TEST) test strip CHECK BLOOD SUGAR ONCE DAILY AND AS NEEDED (DX. E11.9) 100 each 1  . losartan-hydrochlorothiazide (HYZAAR) 100-25 MG tablet Take 1 tablet by mouth daily. 90 tablet 3  . metFORMIN (GLUCOPHAGE-XR) 500 MG 24 hr tablet Take 1 tablet (500 mg total) by mouth daily with breakfast. 30 tablet 11  . metoprolol tartrate (LOPRESSOR) 50 MG tablet Take 1 tablet (50 mg total) by mouth once for 1 dose. Take one hour before cardiac CT 1 tablet 0  . ONETOUCH DELICA LANCETS 09T MISC Check blood sugar once daily and as directed. Dx E11.9 100 each 1  . potassium chloride SA (K-DUR,KLOR-CON) 20 MEQ tablet Take 1 tablet (20 mEq total) by mouth daily. 90 tablet 3   No facility-administered medications prior to visit.      Allergies:   Bee venom; Nabumetone; Amoxicillin-pot clavulanate; Aspirin; Atorvastatin; Clopidogrel bisulfate; Codeine;  Ezetimibe; Metformin and related; Valsartan; and Other   Social History   Socioeconomic History  . Marital status: Married    Spouse name: Eddie Dibbles  . Number of children: 3  . Years of education: 42  . Highest education level: Not on file  Occupational History  . Occupation: Retired    Fish farm manager: RETIRED  Social Needs  . Financial resource strain: Not on file  . Food insecurity:    Worry: Not on file    Inability: Not on file  . Transportation needs:    Medical: Not on file    Non-medical: Not on file  Tobacco Use  . Smoking status: Never Smoker  . Smokeless tobacco: Never Used  Substance and Sexual Activity  . Alcohol use: No    Alcohol/week: 0.0 oz  . Drug use: No  . Sexual activity: Never  Lifestyle  . Physical activity:  Days per week: Not on file    Minutes per session: Not on file  . Stress: Not on file  Relationships  . Social connections:    Talks on phone: Not on file    Gets together: Not on file    Attends religious service: Not on file    Active member of club or organization: Not on file    Attends meetings of clubs or organizations: Not on file    Relationship status: Not on file  Other Topics Concern  . Not on file  Social History Narrative   3 children   Does not drink caffeinated beverages   Cares for SIL with dementia     Family History:  The patient's family history includes Brain cancer in her other; Diabetes in her brother and sister; Heart disease in her father and mother; Lung cancer in her brother; Pancreatic cancer in her brother; Skin cancer in her daughter.   ROS:   Please see the history of present illness.    ROS All other systems reviewed and are negative.   PHYSICAL EXAM:   VS:  BP 130/62 (BP Location: Left Arm, Patient Position: Sitting, Cuff Size: Large)   Pulse 78   Ht 5\' 8"  (1.727 m)   Wt 195 lb (88.5 kg)   BMI 29.65 kg/m    GEN: Well nourished, well developed, in no acute distress  HEENT: normal  Neck: no JVD, carotid  bruits, or masses Cardiac: RRR; no murmurs, rubs, or gallops,no edema  Respiratory:  clear to auscultation bilaterally, normal work of breathing GI: soft, nontender, nondistended, + BS MS: no deformity or atrophy  Skin: warm and dry, no rash Neuro:  Alert and Oriented x 3, Strength and sensation are intact Psych: euthymic mood, full affect  Wt Readings from Last 3 Encounters:  12/22/17 195 lb (88.5 kg)  12/11/17 199 lb 4 oz (90.4 kg)  10/29/17 201 lb 9.6 oz (91.4 kg)      Studies/Labs Reviewed:   EKG:  EKG is ordered today.  The ekg ordered today demonstrates normal sinus rhythm, no significant ST-T wave changes  Recent Labs: 09/01/2017: ALT 13; Hemoglobin 13.7; Platelets 228.0; TSH 4.00 12/02/2017: BUN 14; Creatinine, Ser 0.98; Potassium 4.1; Sodium 141   Lipid Panel    Component Value Date/Time   CHOL 200 09/01/2017 1002   TRIG 188.0 (H) 09/01/2017 1002   HDL 48.80 09/01/2017 1002   CHOLHDL 4 09/01/2017 1002   VLDL 37.6 09/01/2017 1002   LDLCALC 114 (H) 09/01/2017 1002   LDLDIRECT 107.0 08/27/2016 0948    Additional studies/ records that were reviewed today include:   Cath 04/09/2011 PROCEDURAL FINDINGS:  Aortic pressure is 157/83 with a mean of 115.  CORONARY ANGIOGRAPHY:  The left main is patent.  There is no obstructive disease.  It divides into the LAD and left circumflex.  LAD:  The LAD has mild nonobstructive stenosis throughout the proximal vessel.  There is 20-30% luminal narrowing.  The vessel is essentially widely patent and gives off a tiny first diagonal and a moderate caliber second diagonal branch.  LEFT CIRCUMFLEX:  There is a small to moderate caliber intermediate branch with no significant stenosis.  The AV groove circumflex has a 50% mid stenosis.  It supplies a single OM branch.  RIGHT CORONARY ARTERY:  This is a large, dominant vessel.  There is no obstructive disease throughout the proximal mid or distal RCA.  There are luminal  irregularities with up to 30% stenosis.  The vessel gives off a large PDA branch and 3 posterolateral branches.  There is mild distal vessel disease noted, but there are no areas of critical stenosis anywhere.  FINAL ASSESSMENT:  Mild diffuse nonobstructive coronary artery disease as described.  RECOMMENDATIONS:  The patient should be treated with medical therapy with focus on control of her blood pressure.  She is allergic to both aspirin and Plavix and antiplatelet therapy is probably not necessary.   Coronary CT 12/09/2017 IMPRESSION: 1. Coronary calcium score of 357. This was 36 percentile for age and sex matched control.  2. Normal coronary origin with right dominance.  3. Severe stenosis in the proximal non-dominant LCX artery, mild stenosis in the distal left main, moderate stenosis in the proximal to mid LAD and proximal first diagonal artery and possible severe stenosis in the proximal ramus intermedius. Additional analysis with CT FFR will be submitted.  4. Mildly dilated pulmonary artery measuring 34 mm suggestive of pulmonary hypertension.  1. Left Main:  No significant stenosis.  2. LAD: Proximal CT FFR: 0.86, mid LAD CT FFR: 0.79. 3. Diagonal artery: 0.77. 4. LCX: No significant stenosis. 5. Ramus intermedius: CT FFR: 086. 6. RCA: No significant stenosis.  IMPRESSION: 1. CT FFR analysis showed hemodynamically significant stenosis in the proximal to mid LAD and first diagonal artery. Cardiac catheterization is recommended.  ASSESSMENT:    1. Abnormal cardiac CT angiography   2. Pre-procedure lab exam   3. Exertional chest pain   4. Coronary artery disease involving native coronary artery of native heart with angina pectoris ([place])   5. Controlled type 2 diabetes mellitus without complication, without long-term current use of insulin ([place])   6. Essential hypertension   7. H/O: CVA (cerebrovascular accident)   8. PAF (paroxysmal atrial fibrillation)  (HCC)      PLAN:  In order of problems listed above:  1. Abnormal coronary CT: Coronary CT was ordered for ischemic work-up given recent exertional chest discomfort, this showed possible significant blockage in the proximal to mid LAD territory involving the diagonal, and questionable left circumflex lesion.  - Risk and benefit of procedure explained to the patient who display clear understanding and agree to proceed.  Discussed with patient possible procedural risk include bleeding, vascular injury, renal injury, arrythmia, MI, stroke and loss of limb or life.  -Patient is allergic to both aspirin and Plavix, she cannot take Effient due to a prior history of stroke, only antiplatelet medication available would be Brilinta.  May need to consider bare-metal stent to shorten the duration of therapy.  -Instructed the patient to stop taking Pletal.  In fact this is a medication that is already been stopped by her PCP, however she has been taking it.  2. CAD: Previous cardiac catheterization in 2012 showed a nonobstructive disease  3. Hypertension: Blood pressure stable  4. PAF: Solitary episode occurred in the setting of epinephrine use for anaphylaxis, no plan for anticoagulation therapy  5. History of CVA: Followed by neurology, cryptogenic stroke  6. DM2: Managed by primary care provider    Medication Adjustments/Labs and Tests Ordered: Current medicines are reviewed at length with the patient today.  Concerns regarding medicines are outlined above.  Medication changes, Labs and Tests ordered today are listed in the Patient Instructions below. Patient Instructions  Medication Instructions: Stop taking: Pletal   If you need a refill on your cardiac medications before your next appointment, please call your pharmacy.   Labwork: Your physician recommends that you return for lab  work in: Today (CBC/BMP)   Procedures/Testing: Your physician has requested that you have a cardiac  catheterization. Cardiac catheterization is used to diagnose and/or treat various heart conditions. Doctors may recommend this procedure for a number of different reasons. The most common reason is to evaluate chest pain. Chest pain can be a symptom of coronary artery disease (CAD), and cardiac catheterization can show whether plaque is narrowing or blocking your heart's arteries. This procedure is also used to evaluate the valves, as well as measure the blood flow and oxygen levels in different parts of your heart. For further information please visit HugeFiesta.[email or web address]. Please follow instruction sheet, as given.     Follow-Up: Your physician wants you to follow-up in 3-4 weeks Almyra Deforest, [place]  Special Instructions:   [place] [address] [place] [address] Dept: [phone] Loc: [place]  12/22/2017  You are scheduled for a Cardiac Catheterization on Thursday, June 20 with Dr. Quay Burow.  1. Please arrive at the [facility] (Main Entrance A) at [facility]: [address] [place], [place] [address] at 8:00 AM (two hours before your procedure to ensure your preparation). Free valet parking service is available.   Special note: Every effort is made to have your procedure done on time. Please understand that emergencies sometimes delay scheduled procedures.  2. Diet: Do not eat or drink anything after midnight prior to your procedure except sips of water to take medications.  3. Labs: Today  4. Medication instructions in preparation for your procedure:   Hold Metformin, Glipizide, Hyzaar, and Potassium the morning of procedure.  Hold Metformin 48 hours after procedure    On the morning of your procedure, take any morning medicines NOT listed above.  You may use sips of water.  5. Plan for one night stay--bring personal belongings. 6. Bring a current  list of your medications and current insurance cards. 7. You MUST have a responsible person to drive you home. 8. Someone MUST be with you the first 24 hours after you arrive home or your discharge will be delayed. 9. Please wear clothes that are easy to get on and off and wear slip-on shoes.  Thank you for allowing [place] to care for you!   -- [place] [facility]    Thank you for choosing Heartcare at [facility]!!       Signed, Almyra Deforest, [place]  12/22/2017 4:57 PM    [place], [place], [place]  [address] Phone: [phone]; Fax: [phone]

## 2017-12-22 NOTE — Patient Instructions (Addendum)
Medication Instructions: Stop taking: Pletal   If you need a refill on your cardiac medications before your next appointment, please call your pharmacy.   Labwork: Your physician recommends that you return for lab work in: Today (CBC/BMP)   Procedures/Testing: Your physician has requested that you have a cardiac catheterization. Cardiac catheterization is used to diagnose and/or treat various heart conditions. Doctors may recommend this procedure for a number of different reasons. The most common reason is to evaluate chest pain. Chest pain can be a symptom of coronary artery disease (CAD), and cardiac catheterization can show whether plaque is narrowing or blocking your heart's arteries. This procedure is also used to evaluate the valves, as well as measure the blood flow and oxygen levels in different parts of your heart. For further information please visit HugeFiesta.[email or web address]. Please follow instruction sheet, as given.     Follow-Up: Your physician wants you to follow-up in 3-4 weeks Almyra Deforest, [place]  Special Instructions:   [place] [address] [place] [address] Dept: [phone] Loc: [place]  12/22/2017  You are scheduled for a Cardiac Catheterization on Thursday, June 20 with Dr. Quay Burow.  1. Please arrive at the [facility] (Main Entrance A) at [facility]: [address] [place], [place] [address] at 8:00 AM (two hours before your procedure to ensure your preparation). Free valet parking service is available.   Special note: Every effort is made to have your procedure done on time. Please understand that emergencies sometimes delay scheduled procedures.  2. Diet: Do not eat or drink anything after midnight prior to your procedure except sips of water to take medications.  3. Labs: Today  4. Medication instructions in preparation for your  procedure:   Hold Metformin, Glipizide, Hyzaar, and Potassium the morning of procedure.  Hold Metformin 48 hours after procedure    On the morning of your procedure, take any morning medicines NOT listed above.  You may use sips of water.  5. Plan for one night stay--bring personal belongings. 6. Bring a current list of your medications and current insurance cards. 7. You MUST have a responsible person to drive you home. 8. Someone MUST be with you the first 24 hours after you arrive home or your discharge will be delayed. 9. Please wear clothes that are easy to get on and off and wear slip-on shoes.  Thank you for allowing [place] to care for you!   -- [place] [facility]    Thank you for choosing Heartcare at [facility]!!

## 2017-12-23 ENCOUNTER — Other Ambulatory Visit: Payer: Self-pay | Admitting: Physician Assistant

## 2017-12-23 LAB — BASIC METABOLIC PANEL
BUN/Creatinine Ratio: 19 (ref 12–28)
BUN: 21 mg/dL (ref 8–27)
CO2: 22 mmol/L (ref 20–29)
Calcium: 9.4 mg/dL (ref 8.7–10.3)
Chloride: 103 mmol/L (ref 96–106)
Creatinine, Ser: 1.13 mg/dL — ABNORMAL HIGH (ref 0.57–1.00)
GFR calc Af Amer: 53 mL/min/{1.73_m2} — ABNORMAL LOW (ref >59)
GFR calc non Af Amer: 46 mL/min/{1.73_m2} — ABNORMAL LOW (ref >59)
Glucose: 130 mg/dL — ABNORMAL HIGH (ref 65–99)
Potassium: 4.2 mmol/L (ref 3.5–5.2)
Sodium: 141 mmol/L (ref 134–144)

## 2017-12-23 LAB — CBC
Hematocrit: 40.8 % (ref 34.0–46.6)
Hemoglobin: 13.9 g/dL (ref 11.1–15.9)
MCH: 32.7 pg (ref 26.6–33.0)
MCHC: 34.1 g/dL (ref 31.5–35.7)
MCV: 96 fL (ref 79–97)
Platelets: 270 10*3/uL (ref 150–450)
RBC: 4.25 x10E6/uL (ref 3.77–5.28)
RDW: 13.6 % (ref 12.3–15.4)
WBC: 8.2 10*3/uL (ref 3.4–10.8)

## 2017-12-23 NOTE — Progress Notes (Signed)
Precath lab ok, kidney function slightly down, likely a little dehydrated. Have cath scheduled for tomorrow, will need to hold her Hyzaar in the AM of the cath. Pre cath hydration already ordered and once she gets to the hospital, the nurse will start hydrating her kidney further

## 2017-12-24 ENCOUNTER — Ambulatory Visit (HOSPITAL_COMMUNITY)
Admission: RE | Admit: 2017-12-24 | Discharge: 2017-12-25 | Disposition: A | Discharge status: Home / Self Care | Payer: PPO | Source: Ambulatory Visit | Attending: Cardiovascular Disease | Admitting: Cardiovascular Disease

## 2017-12-24 ENCOUNTER — Encounter (HOSPITAL_COMMUNITY)
Admission: RE | Disposition: A | Discharge status: Home / Self Care | Payer: Self-pay | Source: Ambulatory Visit | Attending: Cardiovascular Disease

## 2017-12-24 ENCOUNTER — Other Ambulatory Visit: Payer: Self-pay

## 2017-12-24 ENCOUNTER — Encounter (HOSPITAL_COMMUNITY): Payer: Self-pay | Admitting: General Practice

## 2017-12-24 DIAGNOSIS — Z885 Allergy status to narcotic agent status: Secondary | ICD-10-CM | POA: Diagnosis not present

## 2017-12-24 DIAGNOSIS — Z886 Allergy status to analgesic agent status: Secondary | ICD-10-CM | POA: Insufficient documentation

## 2017-12-24 DIAGNOSIS — I200 Unstable angina: Secondary | ICD-10-CM

## 2017-12-24 DIAGNOSIS — E1169 Type 2 diabetes mellitus with other specified complication: Secondary | ICD-10-CM | POA: Diagnosis present

## 2017-12-24 DIAGNOSIS — I208 Other forms of angina pectoris: Secondary | ICD-10-CM | POA: Diagnosis present

## 2017-12-24 DIAGNOSIS — M791 Myalgia, unspecified site: Secondary | ICD-10-CM | POA: Insufficient documentation

## 2017-12-24 DIAGNOSIS — Z86010 Personal history of colonic polyps: Secondary | ICD-10-CM | POA: Insufficient documentation

## 2017-12-24 DIAGNOSIS — E785 Hyperlipidemia, unspecified: Secondary | ICD-10-CM | POA: Insufficient documentation

## 2017-12-24 DIAGNOSIS — Z9841 Cataract extraction status, right eye: Secondary | ICD-10-CM | POA: Insufficient documentation

## 2017-12-24 DIAGNOSIS — Z91030 Bee allergy status: Secondary | ICD-10-CM | POA: Insufficient documentation

## 2017-12-24 DIAGNOSIS — Z85828 Personal history of other malignant neoplasm of skin: Secondary | ICD-10-CM | POA: Insufficient documentation

## 2017-12-24 DIAGNOSIS — Z8673 Personal history of transient ischemic attack (TIA), and cerebral infarction without residual deficits: Secondary | ICD-10-CM | POA: Insufficient documentation

## 2017-12-24 DIAGNOSIS — Z880 Allergy status to penicillin: Secondary | ICD-10-CM | POA: Insufficient documentation

## 2017-12-24 DIAGNOSIS — Z9851 Tubal ligation status: Secondary | ICD-10-CM | POA: Insufficient documentation

## 2017-12-24 DIAGNOSIS — I25118 Atherosclerotic heart disease of native coronary artery with other forms of angina pectoris: Secondary | ICD-10-CM

## 2017-12-24 DIAGNOSIS — I25119 Atherosclerotic heart disease of native coronary artery with unspecified angina pectoris: Secondary | ICD-10-CM | POA: Diagnosis not present

## 2017-12-24 DIAGNOSIS — I10 Essential (primary) hypertension: Secondary | ICD-10-CM | POA: Diagnosis not present

## 2017-12-24 DIAGNOSIS — Z9842 Cataract extraction status, left eye: Secondary | ICD-10-CM | POA: Diagnosis not present

## 2017-12-24 DIAGNOSIS — I480 Paroxysmal atrial fibrillation: Secondary | ICD-10-CM | POA: Diagnosis not present

## 2017-12-24 DIAGNOSIS — Z9889 Other specified postprocedural states: Secondary | ICD-10-CM | POA: Insufficient documentation

## 2017-12-24 DIAGNOSIS — Z7984 Long term (current) use of oral hypoglycemic drugs: Secondary | ICD-10-CM | POA: Insufficient documentation

## 2017-12-24 DIAGNOSIS — Z79899 Other long term (current) drug therapy: Secondary | ICD-10-CM | POA: Insufficient documentation

## 2017-12-24 DIAGNOSIS — Z8249 Family history of ischemic heart disease and other diseases of the circulatory system: Secondary | ICD-10-CM | POA: Insufficient documentation

## 2017-12-24 DIAGNOSIS — E1151 Type 2 diabetes mellitus with diabetic peripheral angiopathy without gangrene: Secondary | ICD-10-CM | POA: Diagnosis not present

## 2017-12-24 DIAGNOSIS — I2510 Atherosclerotic heart disease of native coronary artery without angina pectoris: Secondary | ICD-10-CM | POA: Diagnosis present

## 2017-12-24 DIAGNOSIS — Z955 Presence of coronary angioplasty implant and graft: Secondary | ICD-10-CM

## 2017-12-24 DIAGNOSIS — Z833 Family history of diabetes mellitus: Secondary | ICD-10-CM | POA: Insufficient documentation

## 2017-12-24 HISTORY — DX: Reserved for concepts with insufficient information to code with codable children: IMO0002

## 2017-12-24 HISTORY — PX: CORONARY ANGIOPLASTY WITH STENT PLACEMENT: SHX49

## 2017-12-24 HISTORY — PX: CORONARY STENT INTERVENTION: CATH118234

## 2017-12-24 HISTORY — DX: Type 2 diabetes mellitus without complications: E11.9

## 2017-12-24 HISTORY — DX: Basal cell carcinoma of skin, unspecified: C44.91

## 2017-12-24 HISTORY — DX: Chronic kidney disease, stage 2 (mild): N18.2

## 2017-12-24 HISTORY — PX: LEFT HEART CATH AND CORONARY ANGIOGRAPHY: CATH118249

## 2017-12-24 LAB — GLUCOSE, CAPILLARY
Glucose-Capillary: 125 mg/dL — ABNORMAL HIGH (ref 65–99)
Glucose-Capillary: 112 mg/dL — ABNORMAL HIGH (ref 65–99)
Glucose-Capillary: 105 mg/dL — ABNORMAL HIGH (ref 65–99)
Glucose-Capillary: 139 mg/dL — ABNORMAL HIGH (ref 65–99)
Glucose-Capillary: 106 mg/dL — ABNORMAL HIGH (ref 65–99)

## 2017-12-24 LAB — POCT ACTIVATED CLOTTING TIME: Activated Clotting Time: 472 seconds

## 2017-12-24 SURGERY — LEFT HEART CATH AND CORONARY ANGIOGRAPHY
Anesthesia: LOCAL

## 2017-12-24 MED ORDER — TICAGRELOR 90 MG PO TABS
ORAL_TABLET | ORAL | Status: DC | PRN
  Administered 2017-12-24: 180 mg via ORAL

## 2017-12-24 MED ORDER — HEPARIN SODIUM (PORCINE) 1000 UNIT/ML IJ SOLN
INTRAMUSCULAR | Status: DC | PRN
  Administered 2017-12-24: 4500 [IU] via INTRAVENOUS

## 2017-12-24 MED ORDER — ENSURE ENLIVE PO LIQD
237.0000 mL | Freq: Two times a day (BID) | ORAL | Status: DC
  Administered 2017-12-25: 237 mL via ORAL
  Filled 2017-12-24 (×5): qty 237

## 2017-12-24 MED ORDER — HEPARIN (PORCINE) IN NACL 2-0.9 UNITS/ML
INTRAMUSCULAR | Status: AC | PRN
  Administered 2017-12-24: 1000 mL

## 2017-12-24 MED ORDER — VERAPAMIL HCL 2.5 MG/ML IV SOLN
INTRA_ARTERIAL | Status: DC | PRN
  Administered 2017-12-24: 12:00:00 via INTRA_ARTERIAL

## 2017-12-24 MED ORDER — HYDRALAZINE HCL 20 MG/ML IJ SOLN
5.0000 mg | INTRAMUSCULAR | Status: AC | PRN
  Filled 2017-12-24: qty 1

## 2017-12-24 MED ORDER — HYDROCHLOROTHIAZIDE 25 MG PO TABS
25.0000 mg | ORAL_TABLET | Freq: Every day | ORAL | Status: DC
  Administered 2017-12-24 – 2017-12-25 (×2): 25 mg via ORAL
  Filled 2017-12-24 (×2): qty 1

## 2017-12-24 MED ORDER — SODIUM CHLORIDE 0.9 % IV SOLN
INTRAVENOUS | Status: AC | PRN
  Administered 2017-12-24 (×2): 1.75 mg/kg/h via INTRAVENOUS

## 2017-12-24 MED ORDER — HEPARIN (PORCINE) IN NACL 1000-0.9 UT/500ML-% IV SOLN
INTRAVENOUS | Status: AC
  Filled 2017-12-24: qty 500

## 2017-12-24 MED ORDER — SODIUM CHLORIDE 0.9% FLUSH: 3.0000 mL | INTRAVENOUS | Status: DC | PRN

## 2017-12-24 MED ORDER — POTASSIUM CHLORIDE CRYS ER 20 MEQ PO TBCR: 20.0000 meq | EXTENDED_RELEASE_TABLET | Freq: Every day | ORAL | Status: DC

## 2017-12-24 MED ORDER — SODIUM CHLORIDE 0.9% FLUSH: 3.0000 mL | Freq: Two times a day (BID) | INTRAVENOUS | Status: DC

## 2017-12-24 MED ORDER — BIVALIRUDIN TRIFLUOROACETATE 250 MG IV SOLR
INTRAVENOUS | Status: AC
  Filled 2017-12-24: qty 250

## 2017-12-24 MED ORDER — VERAPAMIL HCL 2.5 MG/ML IV SOLN
INTRAVENOUS | Status: AC
  Filled 2017-12-24: qty 2

## 2017-12-24 MED ORDER — LIDOCAINE HCL (PF) 1 % IJ SOLN
INTRAMUSCULAR | Status: DC | PRN
  Administered 2017-12-24: 2 mL

## 2017-12-24 MED ORDER — GLUCOSE BLOOD VI STRP: 1.0000 | ORAL_STRIP | Status: DC | PRN

## 2017-12-24 MED ORDER — NITROGLYCERIN 1 MG/10 ML FOR IR/CATH LAB
INTRA_ARTERIAL | Status: AC
  Filled 2017-12-24: qty 10

## 2017-12-24 MED ORDER — SODIUM CHLORIDE 0.9 % IV SOLN: 250.0000 mL | INTRAVENOUS | Status: DC | PRN

## 2017-12-24 MED ORDER — BIVALIRUDIN BOLUS VIA INFUSION - CUPID
INTRAVENOUS | Status: DC | PRN
  Administered 2017-12-24: 66.375 mg via INTRAVENOUS

## 2017-12-24 MED ORDER — SODIUM CHLORIDE 0.9% FLUSH
3.0000 mL | Freq: Two times a day (BID) | INTRAVENOUS | Status: DC
  Administered 2017-12-25: 3 mL via INTRAVENOUS

## 2017-12-24 MED ORDER — TICAGRELOR 90 MG PO TABS
90.0000 mg | ORAL_TABLET | Freq: Two times a day (BID) | ORAL | Status: DC
  Administered 2017-12-24 – 2017-12-25 (×2): 90 mg via ORAL
  Filled 2017-12-24 (×2): qty 1

## 2017-12-24 MED ORDER — ANGIOPLASTY BOOK
Freq: Once | Status: AC
  Administered 2017-12-24: 1
  Filled 2017-12-24: qty 1

## 2017-12-24 MED ORDER — ACETAMINOPHEN 325 MG PO TABS
650.0000 mg | ORAL_TABLET | ORAL | Status: DC | PRN
  Administered 2017-12-24: 650 mg via ORAL
  Filled 2017-12-24: qty 2

## 2017-12-24 MED ORDER — LOSARTAN POTASSIUM-HCTZ 100-25 MG PO TABS: 1.0000 | ORAL_TABLET | Freq: Every day | ORAL | Status: DC

## 2017-12-24 MED ORDER — ONDANSETRON HCL 4 MG/2ML IJ SOLN: 4.0000 mg | Freq: Four times a day (QID) | INTRAMUSCULAR | Status: DC | PRN

## 2017-12-24 MED ORDER — IOHEXOL 350 MG/ML SOLN
INTRAVENOUS | Status: DC | PRN
  Administered 2017-12-24: 278 mL via INTRACARDIAC

## 2017-12-24 MED ORDER — SODIUM CHLORIDE 0.9 % WEIGHT BASED INFUSION
3.0000 mL/kg/h | INTRAVENOUS | Status: DC
  Administered 2017-12-24: 3 mL/kg/h via INTRAVENOUS

## 2017-12-24 MED ORDER — LOSARTAN POTASSIUM 50 MG PO TABS
100.0000 mg | ORAL_TABLET | Freq: Every day | ORAL | Status: DC
  Administered 2017-12-24 – 2017-12-25 (×2): 100 mg via ORAL
  Filled 2017-12-24 (×2): qty 2

## 2017-12-24 MED ORDER — LABETALOL HCL 5 MG/ML IV SOLN: 10.0000 mg | INTRAVENOUS | Status: AC | PRN

## 2017-12-24 MED ORDER — SODIUM CHLORIDE 0.9 % IV SOLN
INTRAVENOUS | Status: AC
  Administered 2017-12-24: 19:00:00 via INTRAVENOUS

## 2017-12-24 MED ORDER — TICAGRELOR 90 MG PO TABS
ORAL_TABLET | ORAL | Status: AC
  Filled 2017-12-24: qty 2

## 2017-12-24 MED ORDER — SODIUM CHLORIDE 0.9 % WEIGHT BASED INFUSION: 1.0000 mL/kg/h | INTRAVENOUS | Status: DC

## 2017-12-24 MED ORDER — GLIPIZIDE ER 10 MG PO TB24
10.0000 mg | ORAL_TABLET | Freq: Every day | ORAL | Status: DC
  Administered 2017-12-25: 10 mg via ORAL
  Filled 2017-12-24: qty 1

## 2017-12-24 MED ORDER — BIVALIRUDIN TRIFLUOROACETATE 250 MG IV SOLR
1.7500 mg/kg/h | INTRAVENOUS | Status: AC
Start: 2017-12-24 — End: 2017-12-24
  Filled 2017-12-24 (×2): qty 250

## 2017-12-24 MED ORDER — ATROPINE SULFATE 1 MG/10ML IJ SOSY
PREFILLED_SYRINGE | INTRAMUSCULAR | Status: AC
  Filled 2017-12-24: qty 10

## 2017-12-24 MED ORDER — LIDOCAINE HCL (PF) 1 % IJ SOLN
INTRAMUSCULAR | Status: AC
  Filled 2017-12-24: qty 30

## 2017-12-24 MED ORDER — HEPARIN SODIUM (PORCINE) 1000 UNIT/ML IJ SOLN
INTRAMUSCULAR | Status: AC
  Filled 2017-12-24: qty 1

## 2017-12-24 SURGICAL SUPPLY — 20 items
BALLN SAPPHIRE 2.0X12 (BALLOONS) ×2
BALLN SAPPHIRE ~~LOC~~ 2.5X12 (BALLOONS) ×1 IMPLANT
BALLOON SAPPHIRE 2.0X12 (BALLOONS) IMPLANT
CATH INFINITI 5FR ANG PIGTAIL (CATHETERS) ×1 IMPLANT
CATH OPTITORQUE TIG 4.0 5F (CATHETERS) ×1 IMPLANT
CATH VISTA GUIDE 6FR XB3.5 (CATHETERS) ×1 IMPLANT
DEVICE RAD TR BAND REGULAR (VASCULAR PRODUCTS) ×1 IMPLANT
GLIDESHEATH SLEND A-KIT 6F 22G (SHEATH) ×1 IMPLANT
GUIDEWIRE INQWIRE 1.5J.035X260 (WIRE) IMPLANT
INQWIRE 1.5J .035X260CM (WIRE) ×2
KIT ENCORE 26 ADVANTAGE (KITS) ×1 IMPLANT
KIT HEART LEFT (KITS) ×2 IMPLANT
PACK CARDIAC CATHETERIZATION (CUSTOM PROCEDURE TRAY) ×2 IMPLANT
STENT SYNERGY DES 2.25X16 (Permanent Stent) ×1 IMPLANT
SYR MEDRAD MARK V 150ML (SYRINGE) ×2 IMPLANT
TRANSDUCER W/STOPCOCK (MISCELLANEOUS) ×2 IMPLANT
TUBING CIL FLEX 10 FLL-RA (TUBING) ×2 IMPLANT
WIRE ASAHI PROWATER 180CM (WIRE) ×1 IMPLANT
WIRE HI TORQ VERSACORE-J 145CM (WIRE) ×1 IMPLANT
WIRE RUNTHROUGH .014X180CM (WIRE) ×1 IMPLANT

## 2017-12-24 NOTE — Progress Notes (Signed)
[facility] BAND REMOVAL  LOCATION:    right radial  DEFLATED PER PROTOCOL:    Yes.    TIME BAND OFF / DRESSING APPLIED:    21:15   SITE UPON ARRIVAL:    Level 0  SITE AFTER BAND REMOVAL:    Level 0  CIRCULATION SENSATION AND MOVEMENT:    Within Normal Limits   Yes.    COMMENTS:   Post TR band instructions given. Pt tolerated well.

## 2017-12-24 NOTE — Interval H&P Note (Signed)
Cath Lab Visit (complete for each Cath Lab visit)  Clinical Evaluation Leading to the Procedure:   ACS: No.  Non-ACS:    Anginal Classification: CCS II  Anti-ischemic medical therapy: No Therapy  Non-Invasive Test Results: No non-invasive testing performed  Prior CABG: No previous CABG      History and Physical Interval Note:  12/24/2017 11:50 AM  Margaret Vazquez  has presented today for surgery, with the diagnosis of abnormal ct  The various methods of treatment have been discussed with the patient and family. After consideration of risks, benefits and other options for treatment, the patient has consented to  Procedure(s): LEFT HEART CATH AND CORONARY ANGIOGRAPHY (N/A) as a surgical intervention .  The patient's history has been reviewed, patient examined, no change in status, stable for surgery.  I have reviewed the patient's chart and labs.  Questions were answered to the patient's satisfaction.     Quay Burow

## 2017-12-25 ENCOUNTER — Encounter (HOSPITAL_COMMUNITY): Payer: Self-pay | Admitting: Physician Assistant

## 2017-12-25 DIAGNOSIS — Z833 Family history of diabetes mellitus: Secondary | ICD-10-CM | POA: Diagnosis not present

## 2017-12-25 DIAGNOSIS — M791 Myalgia, unspecified site: Secondary | ICD-10-CM | POA: Diagnosis not present

## 2017-12-25 DIAGNOSIS — I10 Essential (primary) hypertension: Secondary | ICD-10-CM | POA: Diagnosis not present

## 2017-12-25 DIAGNOSIS — E1151 Type 2 diabetes mellitus with diabetic peripheral angiopathy without gangrene: Secondary | ICD-10-CM | POA: Diagnosis not present

## 2017-12-25 DIAGNOSIS — Z8249 Family history of ischemic heart disease and other diseases of the circulatory system: Secondary | ICD-10-CM | POA: Diagnosis not present

## 2017-12-25 DIAGNOSIS — Z85828 Personal history of other malignant neoplasm of skin: Secondary | ICD-10-CM | POA: Diagnosis not present

## 2017-12-25 DIAGNOSIS — Z7984 Long term (current) use of oral hypoglycemic drugs: Secondary | ICD-10-CM | POA: Diagnosis not present

## 2017-12-25 DIAGNOSIS — E785 Hyperlipidemia, unspecified: Secondary | ICD-10-CM | POA: Diagnosis not present

## 2017-12-25 DIAGNOSIS — Z8673 Personal history of transient ischemic attack (TIA), and cerebral infarction without residual deficits: Secondary | ICD-10-CM | POA: Diagnosis not present

## 2017-12-25 DIAGNOSIS — Z86010 Personal history of colonic polyps: Secondary | ICD-10-CM | POA: Diagnosis not present

## 2017-12-25 DIAGNOSIS — I480 Paroxysmal atrial fibrillation: Secondary | ICD-10-CM | POA: Diagnosis not present

## 2017-12-25 DIAGNOSIS — I25119 Atherosclerotic heart disease of native coronary artery with unspecified angina pectoris: Secondary | ICD-10-CM | POA: Diagnosis not present

## 2017-12-25 DIAGNOSIS — I25118 Atherosclerotic heart disease of native coronary artery with other forms of angina pectoris: Secondary | ICD-10-CM | POA: Diagnosis not present

## 2017-12-25 DIAGNOSIS — I200 Unstable angina: Secondary | ICD-10-CM

## 2017-12-25 LAB — CBC
HCT: 37.2 % (ref 36.0–46.0)
Hemoglobin: 12.3 g/dL (ref 12.0–15.0)
MCH: 31.5 pg (ref 26.0–34.0)
MCHC: 33.1 g/dL (ref 30.0–36.0)
MCV: 95.4 fL (ref 78.0–100.0)
Platelets: 203 10*3/uL (ref 150–400)
RBC: 3.9 MIL/uL (ref 3.87–5.11)
RDW: 12.3 % (ref 11.5–15.5)
WBC: 8.7 10*3/uL (ref 4.0–10.5)

## 2017-12-25 LAB — BASIC METABOLIC PANEL
Anion gap: 8 (ref 5–15)
BUN: 15 mg/dL (ref 6–20)
CO2: 22 mmol/L (ref 22–32)
Calcium: 8.4 mg/dL — ABNORMAL LOW (ref 8.9–10.3)
Chloride: 110 mmol/L (ref 101–111)
Creatinine, Ser: 0.82 mg/dL (ref 0.44–1.00)
GFR calc Af Amer: >60 mL/min (ref >60)
GFR calc non Af Amer: >60 mL/min (ref >60)
Glucose, Bld: 116 mg/dL — ABNORMAL HIGH (ref 65–99)
Potassium: 3.6 mmol/L (ref 3.5–5.1)
Sodium: 140 mmol/L (ref 135–145)

## 2017-12-25 LAB — GLUCOSE, CAPILLARY
Glucose-Capillary: 111 mg/dL — ABNORMAL HIGH (ref 65–99)
Glucose-Capillary: 133 mg/dL — ABNORMAL HIGH (ref 65–99)

## 2017-12-25 MED ORDER — NITROGLYCERIN 0.4 MG SL SUBL: 0.4000 mg | SUBLINGUAL_TABLET | SUBLINGUAL | 3 refills | Status: DC | PRN

## 2017-12-25 MED ORDER — TICAGRELOR 90 MG PO TABS: 90.0000 mg | ORAL_TABLET | Freq: Two times a day (BID) | ORAL | 11 refills | Status: DC

## 2017-12-25 MED ORDER — POTASSIUM CHLORIDE CRYS ER 20 MEQ PO TBCR
20.0000 meq | EXTENDED_RELEASE_TABLET | Freq: Every day | ORAL | Status: DC
  Administered 2017-12-25: 20 meq via ORAL
  Filled 2017-12-25: qty 1

## 2017-12-25 NOTE — Discharge Summary (Addendum)
Discharge Summary    Patient ID: Margaret LABRAKE,  MRN: [id number], DOB/AGE: 1936-02-04 82 y.o.  Admit date: 12/24/2017 Discharge date: 12/25/2017  Primary Care Provider: Loura Pardon A Primary Cardiologist: Dr. Caryl Comes  Discharge Diagnoses    Principal Problem:   Progressive angina [facility]) Active Problems:   Hyperlipidemia   Essential hypertension   H/O: CVA (cerebrovascular accident)   Type 2 diabetes mellitus with diabetic peripheral angiopathy without gangrene, without long-term current use of insulin (HCC)   CAD (coronary artery disease)   CKD (chronic kidney disease), stage II    Diagnostic Studies/Procedures    Procedures - See full report for details. CORONARY STENT INTERVENTION  LEFT HEART CATH AND CORONARY ANGIOGRAPHY  Conclusion     Mid RCA lesion is 30% stenosed.  Prox Cx to Mid Cx lesion is 80% stenosed.  A stent was successfully placed.  Post intervention, there is a 0% residual stenosis.  The left ventricular systolic function is normal.  LV end diastolic pressure is normal.  The left ventricular ejection fraction is 55-65% by visual estimate.      _____________     History of Present Illness     Margaret Vazquez is a 82 y.o. female with history of 82 y.o. female with previously nonobstructive CAD, DM II, HLD (intolerant of Lipitor and Zetia), HTN, CVA, prior isolated episode of AF (in setting of anaphylaxis/epinephrine - no AC recommended), probable CKD stage II presented for planned cath due to exertional chest tightness and abnormal cardiac CT.   Previous cardiac catheterization in 2012 showed very mild nonobstructive disease. Patient had a loop recorder placed previously for cryptogenic stroke. This did reveal one episode of atrial fibrillation but this occurred in the context of treatment of anaphylaxis with epinephrine. After discussing with Dr. Leonie Man of neurology service, it was suggested there is no indication for anticoagulation for solitary  episode. She has been followed by Dr. Caryl Comes. In 10/2017 she noted progressive exertional dyspnea and chest tightness. She eventually underwent coronary CT which showed potential obstructive disease in the proximal to mid LAD and also diagonal area with abnormal FFR.  On the initial coronary CT, there was also mention of severe left circumflex disease, however FFR of this area was normal. She cannot take ASA or Plavix due to hives. She was admitted for further evaluation with definitive cath.   Hospital Course     1. CAD with recent progressive angina pectoris - cardiac cath 12/24/17 showed 30% mid RCA and 80% prox-mid Cx. She received DES to mid AV groove Cx. EF 55-65%. She was started on antiplatelet monotherapy with Brilinta with recommendation to continue uninterrupted for 12 months. Discussed importance of not missing doses with patient. She is not a candidate for aspirin or Plavix due to h/o hives and not a candidate for Effient due to prior history of stroke. Although she had various complaints about a restless night unrelated to coronary status (call bell issues, blankets, room temperature), this morning she has no recurrent angina or dyspnea. She ambulated well with cardiac rehab. She was referred to [facility]. Rec'd 30 day free rx for Brilinta. Of note, Pletal was stopped prior to admission by PCP per [facility] note.  2. Possible CKD stage II - OP cr baseline around 0.9-1.1. Hyzaar held in prep for cath. Cr stable post-cath, written to resume.  3. Hyperlipidemia - LDL 114 in 08/2017. Intolerant of atorvastatin (joint swelling) and Zetia (fatigue). Consider OP lipid clinic referral. Pt wishes to hold off  for now and discuss as OP.  4. HTN - resuming home meds this AM.  5. DM - continue glipizide. She was instructed to hold metformin at least 48 hr post cath, to resume 6/23 (takes in AM with breakfast).  She has f/u 7/10 with Isaac Laud - she greatly enjoyed her interactions with him. Dr. Sallyanne Kuster has  seen and examined the patient today and feels she is stable for discharge.  _____________  Discharge Vitals Blood pressure (!) 144/74, pulse 67, temperature 98.1 F (36.7 C), temperature source Oral, resp. rate 18, height '5\' 9"'$  (1.753 m), weight 202 lb 6.1 oz (91.8 kg), SpO2 97 %.  Filed Weights   12/24/17 0824 12/25/17 0327  Weight: 195 lb (88.5 kg) 202 lb 6.1 oz (91.8 kg)    Labs & Radiologic Studies    CBC Recent Labs    12/22/17 1611 12/25/17 0210  WBC 8.2 8.7  HGB 13.9 12.3  HCT 40.8 37.2  MCV 96 95.4  PLT 270 [id number]   Basic Metabolic Panel Recent Labs    12/22/17 1611 12/25/17 0210  NA 141 140  K 4.2 3.6  CL 103 110  CO2 22 22  GLUCOSE 130* 116*  BUN 21 15  CREATININE 1.13* 0.82  CALCIUM 9.4 8.4*   ____________  Ct Coronary Morph W/cta Cor W/score W/ca W/cm &/or Wo/cm  Addendum Date: 12/10/2017   ADDENDUM REPORT: 12/10/2017 08:44 CLINICAL DATA:  82 year old female with known non-obstructive CAD on a cardiac catheterization 10 years ago, now with worsening DOE. EXAM: Cardiac/Coronary  CT TECHNIQUE: The patient was scanned on a [employer]. FINDINGS: A 120 kV prospective scan was triggered in the descending thoracic aorta at 111 HU's. Axial non-contrast 3 mm slices were carried out through the heart. The data set was analyzed on a dedicated work station and scored using the [place]. Gantry rotation speed was 250 msecs and collimation was .6 mm. 5 mg of iv metoprolol and 0.8 mg of sl NTG was given. The 3D data set was reconstructed in 5% intervals of the 67-82 % of the R-R cycle. Diastolic phases were analyzed on a dedicated work station using MPR, MIP and VRT modes. The patient received 80 cc of contrast. Aorta:  Normal size.  No calcifications.  No dissection. Aortic Valve:  Trileaflet.  No calcifications. Coronary Arteries:  Normal coronary origin.  Right dominance. RCA is a large dominant artery that gives rise to PDA and PLA. There mild diffuse  calcified plaque with maximum stenosis 25-50% in the mid and distal portion. PDA and PLA have mild luminal irregularities. Left main is a large artery that gives rise to LAD, a small ramus intermedius and LCX arteries. Left main has mild non-calcified plaque in its distal segment with associated stenosis 25-50%. LAD is a medium size vessel that has moderate mixed plaque in the proximal to mid segment with associated stenosis 25-50% and a focal stenosis 50-69%. D1 is a small artery that has moderate non-calcified plaque in the proximal segment with associated stenosis 50-69%. RI is a small artery with possible severe stenosis in its proximal portion. LCX is a medium size non-dominant artery that has severe mixed plaque in its proximal segment with associated stenosis >70%. Other findings: Normal pulmonary vein drainage into the left atrium. Normal let atrial appendage without a thrombus. Mildly dilated pulmonary artery measuring 34 mm. IMPRESSION: 1. Coronary calcium score of 357. This was 80 percentile for age and sex matched control. 2. Normal coronary origin with right dominance.  3. Severe stenosis in the proximal non-dominant LCX artery, mild stenosis in the distal left main, moderate stenosis in the proximal to mid LAD and proximal first diagonal artery and possible severe stenosis in the proximal ramus intermedius. Additional analysis with CT FFR will be submitted. 4. Mildly dilated pulmonary artery measuring 34 mm suggestive of pulmonary hypertension. Electronically Signed   By: Ena Dawley   On: 12/10/2017 08:44   Result Date: 12/10/2017 EXAM: OVER-READ INTERPRETATION  CT CHEST The following report is an over-read performed by radiologist Dr. Rolm Baptise of [facility], [place] on 12/09/2017. This over-read does not include interpretation of cardiac or coronary anatomy or pathology. The coronary CTA interpretation by the cardiologist is attached. COMPARISON:  Chest CT 04/07/2011 FINDINGS: Vascular:  Heart is normal size.  Visualized aorta is normal caliber. Mediastinum/Nodes: No adenopathy in the lower mediastinum or hila. Lungs/Pleura: Visualized lungs clear.  No effusions. Upper Abdomen: Imaging into the upper abdomen shows no acute findings. Musculoskeletal: Loop recorder device noted in the left anterior chest wall. No acute bony abnormality. IMPRESSION: No acute or significant extracardiac abnormality. Electronically Signed: By: Rolm Baptise M.D. On: 12/09/2017 13:37   Ct Coronary Fractional Flow Reserve Data Prep  Result Date: 12/10/2017 EXAM: FF/RCT ANALYSIS FINDINGS: FFRct analysis was performed on the original cardiac CT angiogram dataset. Diagrammatic representation of the FFRct analysis is provided in a separate PDF document in PACS. This dictation was created using the PDF document and an interactive 3D model of the results. 3D model is not available in the EMR/PACS. Normal FFR range is >0.80. 1. Left Main:  No significant stenosis. 2. LAD: Proximal CT FFR: 0.86, mid LAD CT FFR: 0.79. 3. Diagonal artery: 0.77. 4. LCX: No significant stenosis. 5. Ramus intermedius: CT FFR: 086. 6. RCA: No significant stenosis. IMPRESSION: 1. CT FFR analysis showed hemodynamically significant stenosis in the proximal to mid LAD and first diagonal artery. Cardiac catheterization is recommended. Electronically Signed   By: Ena Dawley   On: 12/10/2017 19:18   Disposition   Pt is being discharged home today in good condition.  Follow-up Plans & Appointments    Follow-up Information    Almyra Deforest, [place] Follow up.   Specialties:  Cardiology, Radiology Why:  Keep follow-up appointment as planned below. Contact information: [address] [place] [address] [phone]          Discharge Instructions    AMB Referral to Cardiac Rehabilitation - Phase II   Complete by:  As directed    Diagnosis:  Coronary Stents   Amb Referral to Cardiac Rehabilitation   Complete by:  As  directed    Diagnosis:  Coronary Stents   Diet - low sodium heart healthy   Complete by:  As directed    Discharge instructions   Complete by:  As directed    IMPORTANT! You will need to hold your metformin for at least 48 hours after cardiac cath. To make it simple, you can restart this medicine 12/27/17.   Increase activity slowly   Complete by:  As directed    No driving for 2 days. No lifting over 5 lbs for 1 week. No sexual activity for 1 week. Keep procedure site clean & dry. If you notice increased pain, swelling, bleeding or pus, call/return!  You may shower, but no soaking baths/hot tubs/pools for 1 week.      Discharge Medications   Allergies as of 12/25/2017      Reactions   Bee  Venom Hives, Shortness Of Breath, Swelling   Nabumetone Anaphylaxis   Amoxicillin-pot Clavulanate Hives, Swelling   To lips.   Aspirin Hives   Atorvastatin Swelling    joint pain/swelling, inc liver tests   Clopidogrel Bisulfate Hives   Codeine Nausea And Vomiting   Ezetimibe Other (See Comments)    fatigue   Metformin And Related Other (See Comments)   Diarrhea    Valsartan Other (See Comments)    fatigue   Other Hives   **Red Meat**  SOB      Medication List    STOP taking these medications   cilostazol 100 MG tablet Commonly known as:  PLETAL     TAKE these medications   diphenhydrAMINE 25 MG tablet Commonly known as:  BENADRYL Take 50 mg by mouth every 8 (eight) hours as needed for allergies.   EPINEPHrine 0.3 mg/0.3 mL Devi Commonly known as:  EPI-PEN Inject 0.3 mg into the muscle daily as needed (allergic reaction).   fluticasone 50 MCG/ACT nasal spray Commonly known as:  FLONASE Place 2 sprays into both nostrils 2 (two) times daily as needed. Reported on 09/10/2015   glipiZIDE 10 MG 24 hr tablet Commonly known as:  GLUCOTROL XL Take 1 tablet (10 mg total) by mouth daily with breakfast.   glucose blood test strip Commonly known as:  ONE TOUCH ULTRA TEST CHECK BLOOD  SUGAR ONCE DAILY AND AS NEEDED (DX. E11.9)   losartan-hydrochlorothiazide 100-25 MG tablet Commonly known as:  HYZAAR Take 1 tablet by mouth daily.   metFORMIN 500 MG 24 hr tablet Commonly known as:  GLUCOPHAGE-XR Take 1 tablet (500 mg total) by mouth daily with breakfast.   nitroGLYCERIN 0.4 MG SL tablet Commonly known as:  NITROSTAT Place 1 tablet (0.4 mg total) under the tongue every 5 (five) minutes as needed for chest pain (up to 3 doses. If taking 3rd dose call 911).   ONETOUCH DELICA LANCETS 99991111 Misc Check blood sugar once daily and as directed. Dx E11.9   potassium chloride SA 20 MEQ tablet Commonly known as:  K-DUR,KLOR-CON Take 1 tablet (20 mEq total) by mouth daily.   ticagrelor 90 MG Tabs tablet Commonly known as:  BRILINTA Take 1 tablet (90 mg total) by mouth 2 (two) times daily.        Allergies:  Allergies  Allergen Reactions  . Bee Venom Hives, Shortness Of Breath and Swelling  . Nabumetone Anaphylaxis  . Amoxicillin-Pot Clavulanate Hives and Swelling    To lips.  . Aspirin Hives  . Atorvastatin Swelling     joint pain/swelling, inc liver tests  . Clopidogrel Bisulfate Hives  . Codeine Nausea And Vomiting  . Ezetimibe Other (See Comments)     fatigue  . Metformin And Related Other (See Comments)    Diarrhea   . Valsartan Other (See Comments)     fatigue  . Other Hives    **Red Meat**  SOB    Outstanding Labs/Studies   N/A  Duration of Discharge Encounter   Greater than 30 minutes including physician time.  Signed, Charlie Pitter PA-C 12/25/2017, 10:51 AM

## 2017-12-25 NOTE — Progress Notes (Signed)
  #   5.  S/W GABRIELI @ [facility] # [phone]    BRILINTA 90 MG BID  COVER- YES  CO-PAY- $ 45.00  TIER- 3 DRUG  PRIOR APPROVAL- NO  NO DEDUCTIBLE   PREFERRED PHARMACY- YES [facility]

## 2017-12-25 NOTE — Progress Notes (Addendum)
Progress Note  Patient Name: Margaret Vazquez Date of Encounter: 12/25/2017  Primary Cardiologist: Virl Axe, MD  Subjective   She states she had a terrible night - had to keep arm straight post-cath, issues with call bell, the response time to her urinary urgency, blankets not apportioned comfortably over her bed. Reports feeling "something" in her chest but attributes this to being aware of the telemetry box hitting her skin.  Inpatient Medications    Scheduled Meds: . feeding supplement (ENSURE ENLIVE)  237 mL Oral BID BM  . glipiZIDE  10 mg Oral Q breakfast  . losartan  100 mg Oral Daily   And  . hydrochlorothiazide  25 mg Oral Daily  . sodium chloride flush  3 mL Intravenous Q12H  . ticagrelor  90 mg Oral BID   Continuous Infusions: . sodium chloride     PRN Meds: sodium chloride, acetaminophen, ondansetron (ZOFRAN) IV, sodium chloride flush   Vital Signs    Vitals:   12/24/17 2100 12/24/17 2146 12/25/17 0327 12/25/17 0733  BP: (!) 178/57 (!) 164/61 (!) 145/76 (!) 144/74  Pulse: 60 62 65 67  Resp: 15 14 17 18   Temp:   98.2 F (36.8 C) 98.1 F (36.7 C)  TempSrc:   Oral Oral  SpO2: 96% 96% 97% 97%  Weight:   202 lb 6.1 oz (91.8 kg)   Height:        Intake/Output Summary (Last 24 hours) at 12/25/2017 0819 Last data filed at 12/25/2017 0758 Gross per 24 hour  Intake 1369.9 ml  Output 1200 ml  Net 169.9 ml   Filed Weights   12/24/17 0824 12/25/17 0327  Weight: 195 lb (88.5 kg) 202 lb 6.1 oz (91.8 kg)    Telemetry    NSR - Personally Reviewed   Physical Exam   GEN: No acute distress.  HEENT: Normocephalic, atraumatic, sclera non-icteric. Neck: No JVD or bruits. Cardiac: RRR no murmurs, rubs, or gallops.  Radials/DP/PT 1+ and equal bilaterally.  Respiratory: Clear to auscultation bilaterally. Breathing is unlabored. GI: Soft, nontender, non-distended, BS +x 4. MS: no deformity. Extremities: No clubbing or cyanosis. No edema. Distal pedal pulses  are 2+ and equal bilaterally. Right radial cath site without hematoma or ecchymosis; good pulse. Neuro:  AAOx3. Follows commands. Psych:  Responds to questions appropriately with a normal affect.  Labs    Chemistry Recent Labs  Lab 12/22/17 1611 12/25/17 0210  NA 141 140  K 4.2 3.6  CL 103 110  CO2 22 22  GLUCOSE 130* 116*  BUN 21 15  CREATININE 1.13* 0.82  CALCIUM 9.4 8.4*  GFRNONAA 46* >60  GFRAA 53* >60  ANIONGAP  --  8     Hematology Recent Labs  Lab 12/22/17 1611 12/25/17 0210  WBC 8.2 8.7  RBC 4.25 3.90  HGB 13.9 12.3  HCT 40.8 37.2  MCV 96 95.4  MCH 32.7 31.5  MCHC 34.1 33.1  RDW 13.6 12.3  PLT 270 203    Radiology    No results found.  Patient Profile     82 y.o. female with previously nonobstructive CAD, DM II, HLD (intolerant of Lipitor and Zetia), HTN, CVA, prior isolated episode of AF (in setting of anaphylaxis/epinephrine - no AC recommended) presented for planned cath due to exertional chest tightness and abnormal cardiac CT.   Assessment & Plan    1. CAD with recent progressive angina pectoris - s/p DES to mid AV groove Cx (residual 30% mRCA, EF 55-65%). Allergic  to Plavix and aspirin, not able to take Effient due to h/o stroke. Recommended to continue Brilinta uninterrupted for 12 months. Discussed importance of not missing doses with patient. Will need to see how she does with cardiac rehab this AM. Hopeful for dc today. Care management consult for Brilinta. 30 day free rx written and placed on shadow chart.  2. Possible CKD stage II - OP cr baseline around 0.9-1.1. Hyzaar held in prep for cath. Cr stable post-cath, written to resume.  3. Hyperlipidemia - LDL 114 in 08/2017. Intolerant of atorvastatin (joint swelling) and Zetia (fatigue). Consider OP lipid clinic referral. Pt wishes to hold off for now and discuss as OP.  4. HTN - resuming home meds this AM. Will also resume KCl.  5. DM - continue glipizide. Hold metformin at least 48 hr post  cath.  Has f/u 7/10 with Isaac Laud - she greatly enjoyed her interactions with him.  For questions or updates, please contact [place] Please consult [email or web address] for contact info under Cardiology/STEMI.  Signed, Charlie Pitter, PA-C 12/25/2017, 8:19 AM    I have seen and examined the patient along with Charlie Pitter, PA-C.  I have reviewed the chart, notes and new data.  I agree with PA's note.  Key new complaints: no angina Key examination changes: no problems at radial access site Key new findings / data: cath findings reviewed  PLAN: DC home. Reinforced mandatory DAPT. Needs aggressive lipid lowering, consider PCSK9 inh.  Sanda Klein, MD, [place] [phone] 12/25/2017, 10:13 AM

## 2017-12-25 NOTE — Care Management Note (Signed)
Case Management Note  Patient Details  Name: Margaret Vazquez MRN: [id number] Date of Birth: October 02, 1935  Subjective/Objective:   From home , pta indep, s/p stent intervention, will be on brilinta,  RN Albin Felling will give patient the 30 day savings coupon.  NCM spoke with patient gave her the co pay amt for her refills, she states she goes to [facility] in [place] on [address].  They do have brilinta in stock.                   Action/Plan: DC home when ready.  Expected Discharge Date:                  Expected Discharge Plan:  Home/Self Care  In-House Referral:     Discharge planning Services  CM Consult, Medication Assistance  Post Acute Care Choice:    Choice offered to:     DME Arranged:    DME Agency:     HH Arranged:    HH Agency:     Status of Service:  Completed, signed off  If discussed at [employer] of Stay Meetings, dates discussed:    Additional Comments:  [name], RN 12/25/2017, 9:13 AM

## 2017-12-25 NOTE — Progress Notes (Signed)
CARDIAC REHAB PHASE I   PRE:  Rate/Rhythm: 68 SR  BP:  Sitting: 166/52      SaO2: 98 RA  MODE:  Ambulation: 270 ft   POST:  Rate/Rhythm: 73 SR  BP:  Sitting: 173/69     144/67    SaO2: 97 RA   Pt ambulated 239ft in hallway with assist of one, and slow, steady gait. Pt educated on importance of Brilinta, and NTG. Pt allergic to ASA. Pt has stent card at bedside. Pt given heart healthy and diabetic diets. Encouraged risk factor modification as she is intolerant to statins. Reviewed restrictions and exercise guidelines with pt. Will refer to CRP [facility] [place].   [id number] Rufina Falco, RN BSN 12/25/2017 9:05 AM

## 2017-12-29 ENCOUNTER — Other Ambulatory Visit: Payer: Self-pay | Admitting: Family Medicine

## 2018-01-13 ENCOUNTER — Ambulatory Visit (INDEPENDENT_AMBULATORY_CARE_PROVIDER_SITE_OTHER): Payer: PPO | Admitting: Physician Assistant

## 2018-01-13 ENCOUNTER — Encounter: Payer: Self-pay | Admitting: Physician Assistant

## 2018-01-13 VITALS — BP 120/66 | HR 57 | Ht 69.0 in | Wt 192.0 lb

## 2018-01-13 DIAGNOSIS — I10 Essential (primary) hypertension: Secondary | ICD-10-CM

## 2018-01-13 DIAGNOSIS — I480 Paroxysmal atrial fibrillation: Secondary | ICD-10-CM

## 2018-01-13 DIAGNOSIS — E119 Type 2 diabetes mellitus without complications: Secondary | ICD-10-CM

## 2018-01-13 DIAGNOSIS — E785 Hyperlipidemia, unspecified: Secondary | ICD-10-CM

## 2018-01-13 DIAGNOSIS — Z8673 Personal history of transient ischemic attack (TIA), and cerebral infarction without residual deficits: Secondary | ICD-10-CM

## 2018-01-13 DIAGNOSIS — I2510 Atherosclerotic heart disease of native coronary artery without angina pectoris: Secondary | ICD-10-CM

## 2018-01-13 NOTE — Progress Notes (Signed)
Cardiology Office Note    Date:  01/15/2018   ID:  Margaret Vazquez, DOB 1935-09-09, MRN [id number]  PCP:  Abner Greenspan, MD  Cardiologist:  Dr Caryl Comes  Chief Complaint  Patient presents with  . Follow-up    post cath, seen for Dr. Caryl Comes    History of Present Illness:  Margaret Vazquez is a 82 y.o. female with PMH of CAD, DM II, HLD intolerant to Lipitor and Zetia, hypertension and history of CVA s/p loop recorder.  Previous cardiac catheterization in 2012 showed very mild nonobstructive disease.  Patient had a loop recorder placed previously for cryptogenic stroke, this revealed atrial fibrillation.  This episode of atrial fibrillation occurred in the context of treatment of anaphylaxis with epinephrine.  After discussing with Dr. Leonie Man of neurology service, it was suggested there is no indication for anticoagulation for solitary episode. She has been followed by Dr. Caryl Comes.  During the recent office visit in April 2019, she complained of exertional shortness of breath and a chest discomfort that has been ongoing for the past several months.  She says she would noticed a chest tightness after walking around for 15 minutes prompting her to stop.  She is currently not on aspirin given history of allergy.  She also cannot take Plavix either due to hives.  She eventually underwent coronary CT which showed potential obstructive disease in the proximal to mid LAD and also diagonal area with abnormal FFR.  On the initial coronary CT, there was also mention of severe left circumflex disease, however FFR of this area was normal.  She eventually underwent cardiac catheterization on 12/24/2017 which showed a 30% mid LAD lesion, 80% proximal to mid left circumflex lesion treated with DES, EF 55 to 65%.  Patient presents today along with her husband.  She denies any chest discomfort or shortness of breath.  She is tolerating the Brilinta monotherapy without any issue.  She does not have any lower lower extremity  edema, orthopnea or PND.    Past Medical History:  Diagnosis Date  . Allergy history, drug    Aspirin  . Basal cell carcinoma    "back"  . CAD (coronary artery disease)    a. Previously nonobstructive then progressive angina with abnl CT -> cardiac cath 12/24/17 showed 30% mid RCA and 80% prox-mid Cx. She received DES to mid AV groove Cx. EF 55-65%.   . Cervical stenosis of spine    With neck pain  . CKD (chronic kidney disease), stage II   . Colon polyps 2009  . Complication of anesthesia 1980s   slow to wake after anesthesia "when I had breast biopsy"  . GERD (gastroesophageal reflux disease)   . Hyperlipidemia    myalgias with Lipitor and Zetia  . Hypertension   . Migraine    "stopped at age 33" (12/24/2017)  . Squamous carcinoma    "iced off and cut off; mostly arms" (12/24/2017)  . Stroke [facility])    "told me I'd had 2 strokes in 02/2013"; denies residual on 12/24/2017  . TIA (transient ischemic attack)   . Type II diabetes mellitus ([place])     Past Surgical History:  Procedure Laterality Date  . ABD [place]  07/2003   Negative  . APPENDECTOMY    . BASAL CELL CARCINOMA EXCISION     "back"  . BREAST BIOPSY Left 1992   benign  . CARDIAC CATHETERIZATION  04/07/2011   non obst CAD (Dr Burt Knack)  . CATARACT EXTRACTION W/PHACO  Left 04/01/2017   Procedure: CATARACT EXTRACTION PHACO AND INTRAOCULAR LENS PLACEMENT ([place]) LEFT DIABETIC;  Surgeon: Leandrew Koyanagi, MD;  Location: [place];  Service: Ophthalmology;  Laterality: Left;  Diabetic - oral meds  . CATARACT EXTRACTION W/PHACO Right 04/29/2017   Procedure: CATARACT EXTRACTION PHACO AND INTRAOCULAR LENS PLACEMENT ([place]) RIGHT DIABETIC;  Surgeon: Leandrew Koyanagi, MD;  Location: [place];  Service: Ophthalmology;  Laterality: Right;  Diabetic - oral meds  . COLONOSCOPY  12/2007   Adenomatous colon polyps  . CORONARY ANGIOPLASTY WITH STENT PLACEMENT  12/24/2017  . CORONARY STENT INTERVENTION N/A 12/24/2017    Procedure: CORONARY STENT INTERVENTION;  Surgeon: Lorretta Harp, MD;  Location: [place] CV LAB;  Service: Cardiovascular;  Laterality: N/A;  . CYSTOSCOPY W/ DECANNULATION  03/2000   Normal  . LEFT HEART CATH AND CORONARY ANGIOGRAPHY N/A 12/24/2017   Procedure: LEFT HEART CATH AND CORONARY ANGIOGRAPHY;  Surgeon: Lorretta Harp, MD;  Location: [place] CV LAB;  Service: Cardiovascular;  Laterality: N/A;  . LOOP RECORDER IMPLANT N/A 02/16/2013   Procedure: LOOP RECORDER IMPLANT;  Surgeon: Deboraha Sprang, MD;  Location: [facility] CATH LAB;  Service: Cardiovascular;  Laterality: N/A;  . NASAL SINUS SURGERY  01/2005  . SQUAMOUS CELL CARCINOMA EXCISION     "mostly arms;" (12/24/2017)  . STRESS CARDIOLITE  11/1999   Normal/ negative  . TEAR DUCT PROBING  2005   "? side"  . TEE WITHOUT CARDIOVERSION N/A 02/16/2013   Procedure: TRANSESOPHAGEAL ECHOCARDIOGRAM (TEE);  Surgeon: Josue Hector, MD;  Location[facility] ENDOSCOPY;  Service: Cardiovascular;  Laterality: N/A;  . TUBAL LIGATION     BTL    Current Medications: Outpatient Medications Prior to Visit  Medication Sig Dispense Refill  . diphenhydrAMINE (BENADRYL) 25 MG tablet Take 50 mg by mouth every 8 (eight) hours as needed for allergies.     Marland Kitchen EPINEPHrine (EPI-PEN) 0.3 mg/0.3 mL DEVI Inject 0.3 mg into the muscle daily as needed (allergic reaction).     . fluticasone (FLONASE) 50 MCG/ACT nasal spray Place 2 sprays into both nostrils 2 (two) times daily as needed. Reported on 09/10/2015    . glipiZIDE (GLUCOTROL XL) 10 MG 24 hr tablet Take 1 tablet (10 mg total) by mouth daily with breakfast. 90 tablet 3  . losartan-hydrochlorothiazide (HYZAAR) 100-25 MG tablet Take 1 tablet by mouth daily. 90 tablet 3  . metFORMIN (GLUCOPHAGE-XR) 500 MG 24 hr tablet Take 1 tablet (500 mg total) by mouth daily with breakfast. 30 tablet 11  . nitroGLYCERIN (NITROSTAT) 0.4 MG SL tablet Place 1 tablet (0.4 mg total) under the tongue every 5 (five) minutes as needed  for chest pain (up to 3 doses. If taking 3rd dose call 911). 25 tablet 3  . potassium chloride SA (K-DUR,KLOR-CON) 20 MEQ tablet Take 1 tablet (20 mEq total) by mouth daily. 90 tablet 3  . ticagrelor (BRILINTA) 90 MG TABS tablet Take 1 tablet (90 mg total) by mouth 2 (two) times daily. 60 tablet 11  . glucose blood (ONE TOUCH ULTRA TEST) test strip CHECK BLOOD SUGAR ONCE DAILY AND AS NEEDED (DX. E11.51) 100 each 1  . ONETOUCH DELICA LANCETS 99991111 MISC Check blood sugar once daily and as directed. Dx E11.9 100 each 1   No facility-administered medications prior to visit.      Allergies:   Bee venom; Nabumetone; Amoxicillin-pot clavulanate; Aspirin; Atorvastatin; Clopidogrel bisulfate; Codeine; Ezetimibe; Metformin and related; Valsartan; and Other   Social History   Socioeconomic History  .  Marital status: Married    Spouse name: Eddie Dibbles  . Number of children: 3  . Years of education: 15  . Highest education level: Not on file  Occupational History  . Occupation: Retired    Fish farm manager: RETIRED  Social Needs  . Financial resource strain: Not on file  . Food insecurity:    Worry: Not on file    Inability: Not on file  . Transportation needs:    Medical: Not on file    Non-medical: Not on file  Tobacco Use  . Smoking status: Never Smoker  . Smokeless tobacco: Never Used  Substance and Sexual Activity  . Alcohol use: Never    Alcohol/week: 0.0 oz    Frequency: Never  . Drug use: Never  . Sexual activity: Not Currently  Lifestyle  . Physical activity:    Days per week: Not on file    Minutes per session: Not on file  . Stress: Not on file  Relationships  . Social connections:    Talks on phone: Not on file    Gets together: Not on file    Attends religious service: Not on file    Active member of club or organization: Not on file    Attends meetings of clubs or organizations: Not on file    Relationship status: Not on file  Other Topics Concern  . Not on file  Social History  Narrative   3 children   Does not drink caffeinated beverages   Cares for SIL with dementia     Family History:  The patient's family history includes Brain cancer in her other; Diabetes in her brother and sister; Heart disease in her father and mother; Lung cancer in her brother; Pancreatic cancer in her brother; Skin cancer in her daughter.   ROS:   Please see the history of present illness.    ROS All other systems reviewed and are negative.   PHYSICAL EXAM:   VS:  BP 120/66   Pulse (!) 57   Ht 5' 9"$  (1.753 m)   Wt 192 lb (87.1 kg)   SpO2 94%   BMI 28.35 kg/m    GEN: Well nourished, well developed, in no acute distress  HEENT: normal  Neck: no JVD, carotid bruits, or masses Cardiac: RRR; no murmurs, rubs, or gallops,no edema  Respiratory:  clear to auscultation bilaterally, normal work of breathing GI: soft, nontender, nondistended, + BS MS: no deformity or atrophy  Skin: warm and dry, no rash Neuro:  Alert and Oriented x 3, Strength and sensation are intact Psych: euthymic mood, full affect  Wt Readings from Last 3 Encounters:  01/13/18 192 lb (87.1 kg)  12/25/17 202 lb 6.1 oz (91.8 kg)  12/22/17 195 lb (88.5 kg)      Studies/Labs Reviewed:   EKG:  EKG is ordered today.  The ekg ordered today demonstrates sinus bradycardia, HR 57, no significant ST-T wave changes  Recent Labs: 09/01/2017: ALT 13; TSH 4.00 12/25/2017: BUN 15; Creatinine, Ser 0.82; Hemoglobin 12.3; Platelets 203; Potassium 3.6; Sodium 140   Lipid Panel    Component Value Date/Time   CHOL 200 09/01/2017 1002   TRIG 188.0 (H) 09/01/2017 1002   HDL 48.80 09/01/2017 1002   CHOLHDL 4 09/01/2017 1002   VLDL 37.6 09/01/2017 1002   LDLCALC 114 (H) 09/01/2017 1002   LDLDIRECT 107.0 08/27/2016 0948    Additional studies/ records that were reviewed today include:   Cath 12/24/2017  Mid RCA lesion is 30% stenosed.  Prox Cx to Mid Cx lesion is 80% stenosed.  A stent was successfully  placed.  Post intervention, there is a 0% residual stenosis.  The left ventricular systolic function is normal.  LV end diastolic pressure is normal.  The left ventricular ejection fraction is 55-65% by visual estimate.    ASSESSMENT:    1. Coronary artery disease involving native coronary artery of native heart without angina pectoris   2. Essential hypertension   3. Controlled type 2 diabetes mellitus without complication, without long-term current use of insulin ([place])   4. Hyperlipidemia, unspecified hyperlipidemia type   5. PAF (paroxysmal atrial fibrillation) ([place])   6. H/O: CVA (cerebrovascular accident)      PLAN:  In order of problems listed above:  1. CAD: Status post DES to left circumflex artery.  Cannot tolerate aspirin, Plavix or Effient, currently on Brilinta monotherapy.  2. Hypertension: Blood pressure fairly well controlled.  3. Hyperlipidemia: Multiple intolerance, given the significant coronary artery disease that was seen on the recent cardiac catheterization, will arrange for lipid clinic visit  4. DM2: Managed by primary care provider  5. PAF: Solitary episode was seen on previous loop recorder in the setting of treatment of anaphylaxis with epinephrine.  No systemic anticoagulation unless recurrence.  6. History of CVA: No recurrence    Medication Adjustments/Labs and Tests Ordered: Current medicines are reviewed at length with the patient today.  Concerns regarding medicines are outlined above.  Medication changes, Labs and Tests ordered today are listed in the Patient Instructions below. Patient Instructions  Medication Instructions:  Your physician recommends that you continue on your current medications as directed. Please refer to the Current Medication list given to you today.  Labwork: None   Testing/Procedures: None   Follow-Up: Your provider recommends that you schedule a follow-up appointment in: 3 Months with Dr Caryl Comes Your  provider recommends that you schedule a follow-up appointment in: First Available with PhD in lipid clinic for pcsk9  Any Other Special Instructions Will Be Listed Below (If Applicable). If you need a refill on your cardiac medications before your next appointment, please call your pharmacy.     Hilbert Corrigan, [place]  01/15/2018 11:32 PM    [address], [place], [place]  [address] Phone: [phone]; Fax: [phone]

## 2018-01-13 NOTE — Patient Instructions (Signed)
Medication Instructions:  Your physician recommends that you continue on your current medications as directed. Please refer to the Current Medication list given to you today.  Labwork: None   Testing/Procedures: None   Follow-Up: Your provider recommends that you schedule a follow-up appointment in: 3 Months with Dr Caryl Comes Your provider recommends that you schedule a follow-up appointment in: First Available with PhD in lipid clinic for pcsk9  Any Other Special Instructions Will Be Listed Below (If Applicable). If you need a refill on your cardiac medications before your next appointment, please call your pharmacy.

## 2018-01-15 ENCOUNTER — Encounter: Payer: Self-pay | Admitting: Physician Assistant

## 2018-01-20 ENCOUNTER — Telehealth: Payer: Self-pay | Admitting: Internal Medicine

## 2018-01-20 NOTE — Telephone Encounter (Signed)
New Message     Pt c/o BP issue: STAT if pt c/o blurred vision, one-sided weakness or slurred speech  1. What are your last 5 BP readings?   07/17 106/50-98/58 mid afternoon/97/58 at 1:15  2. Are you having any other symptoms (ex. Dizziness, headache, blurred vision, passed out)? Dizziness   3. What is your BP issue? Patient states her B/P is running low.  Patient wants to know if this is ok, pls advise

## 2018-01-20 NOTE — Telephone Encounter (Signed)
Spoke with pt today who has some c/o low bps the last few days since she has had her stent placed. I have advised her to take her bp before taking her Losartan. If her SBP < 110, pt should hold her BP medication. I have also advised her to speak with her PCP who is managing her HTN. She may want to make dosage changes to her BP meds. Pt agreed to plan and will contact her PCP.

## 2018-02-02 ENCOUNTER — Ambulatory Visit: Payer: PPO

## 2018-02-02 NOTE — Progress Notes (Unsigned)
Patient ID: MIKENZY YAO                 DOB: 25-Aug-1935                    MRN: [id number]     HPI: Margaret Vazquez is a 82 y.o. female patient of Dr Caryl Comes referred to lipid clinic by Almyra Deforest PA. PMH is significant for CAD with prox cx to Mid cx lesion 80% stenosed , DM-II, hypertension, hyperlipidemia,and hx of CVA.   *Repeat lipid is interested in PCSK9i*  Current Medications:   Intolerances:  Atorvastatin Ezetimibe '10mg'$   LDL goal: '70mg'$ /dL  Diet:   Exercise:   Family History:   Social History:   Labs:  Past Medical History:  Diagnosis Date  . Allergy history, drug    Aspirin  . Basal cell carcinoma    "back"  . CAD (coronary artery disease)    a. Previously nonobstructive then progressive angina with abnl CT -> cardiac cath 12/24/17 showed 30% mid RCA and 80% prox-mid Cx. She received DES to mid AV groove Cx. EF 55-65%.   . Cervical stenosis of spine    With neck pain  . CKD (chronic kidney disease), stage II   . Colon polyps 2009  . Complication of anesthesia 1980s   slow to wake after anesthesia "when I had breast biopsy"  . GERD (gastroesophageal reflux disease)   . Hyperlipidemia    myalgias with Lipitor and Zetia  . Hypertension   . Migraine    "stopped at age 55" (12/24/2017)  . Squamous carcinoma    "iced off and cut off; mostly arms" (12/24/2017)  . Stroke [facility])    "told me I'd had 2 strokes in 02/2013"; denies residual on 12/24/2017  . TIA (transient ischemic attack)   . Type II diabetes mellitus ([place])     Current Outpatient Medications on File Prior to Visit  Medication Sig Dispense Refill  . diphenhydrAMINE (BENADRYL) 25 MG tablet Take 50 mg by mouth every 8 (eight) hours as needed for allergies.     Marland Kitchen EPINEPHrine (EPI-PEN) 0.3 mg/0.3 mL DEVI Inject 0.3 mg into the muscle daily as needed (allergic reaction).     . fluticasone (FLONASE) 50 MCG/ACT nasal spray Place 2 sprays into both nostrils 2 (two) times daily as needed. Reported on 09/10/2015    .  glipiZIDE (GLUCOTROL XL) 10 MG 24 hr tablet Take 1 tablet (10 mg total) by mouth daily with breakfast. 90 tablet 3  . losartan-hydrochlorothiazide (HYZAAR) 100-25 MG tablet Take 1 tablet by mouth daily. 90 tablet 3  . metFORMIN (GLUCOPHAGE-XR) 500 MG 24 hr tablet Take 1 tablet (500 mg total) by mouth daily with breakfast. 30 tablet 11  . nitroGLYCERIN (NITROSTAT) 0.4 MG SL tablet Place 1 tablet (0.4 mg total) under the tongue every 5 (five) minutes as needed for chest pain (up to 3 doses. If taking 3rd dose call 911). 25 tablet 3  . potassium chloride SA (K-DUR,KLOR-CON) 20 MEQ tablet Take 1 tablet (20 mEq total) by mouth daily. 90 tablet 3  . ticagrelor (BRILINTA) 90 MG TABS tablet Take 1 tablet (90 mg total) by mouth 2 (two) times daily. 60 tablet 11   No current facility-administered medications on file prior to visit.     Allergies  Allergen Reactions  . Bee Venom Hives, Shortness Of Breath and Swelling  . Nabumetone Anaphylaxis  . Amoxicillin-Pot Clavulanate Hives and Swelling    To lips.  Marland Kitchen  Aspirin Hives  . Atorvastatin Swelling     joint pain/swelling, inc liver tests  . Clopidogrel Bisulfate Hives  . Codeine Nausea And Vomiting  . Ezetimibe Other (See Comments)     fatigue  . Metformin And Related Other (See Comments)    Diarrhea   . Valsartan Other (See Comments)     fatigue  . Other Hives    **Red Meat**  SOB    No problem-specific Assessment & Plan notes found for this encounter.     Alta Goding Rodriguez-Guzman PharmD, BCPS, [place] [address] 02/02/2018 9:00 AM

## 2018-02-09 ENCOUNTER — Encounter: Payer: Self-pay | Admitting: Family Medicine

## 2018-02-09 ENCOUNTER — Ambulatory Visit (INDEPENDENT_AMBULATORY_CARE_PROVIDER_SITE_OTHER): Payer: PPO | Admitting: Family Medicine

## 2018-02-09 VITALS — BP 116/68 | HR 63 | Temp 98.3°F | Ht 68.0 in | Wt 192.2 lb

## 2018-02-09 DIAGNOSIS — I10 Essential (primary) hypertension: Secondary | ICD-10-CM | POA: Diagnosis not present

## 2018-02-09 MED ORDER — LOSARTAN POTASSIUM-HCTZ 50-12.5 MG PO TABS: 1.0000 | ORAL_TABLET | Freq: Every day | ORAL | 3 refills | Status: DC

## 2018-02-09 NOTE — Progress Notes (Signed)
Subjective:    Patient ID: Margaret Vazquez, female    DOB: March 01, 1936, 82 y.o.   MRN: [id number]  HPI Here with concerns of low blood pressure   Wt Readings from Last 3 Encounters:  02/09/18 192 lb 4 oz (87.2 kg)  01/13/18 192 lb (87.1 kg)  12/25/17 202 lb 6.1 oz (91.8 kg)   29.23 kg/m   Blood pressure last 3 readings here BP Readings from Last 3 Encounters:  02/09/18 116/68  01/13/18 120/66  12/25/17 (!) 144/74   Taking hyzaar 100-25 mg daily  Also supplement K   Pulse Readings from Last 3 Encounters:  02/09/18 63  01/13/18 (!) 57  12/25/17 67     At home - usually 112-115/55-60 Had one very low reading one day - 90s/40s  She lay down (felt very tired) and drank a lot of water   Thinks she drinks enough fluids   Blood glucose has ran well  More active     Walking for exercise  Once in a while she is a little dizzy when she stands after bending over   Has c/o low bps since she had stent placed  Was told by cardiology to hold bp med if SBP is below 110   Lab Results  Component Value Date   CREATININE 0.82 12/25/2017   BUN 15 12/25/2017   NA 140 12/25/2017   K 3.6 12/25/2017   CL 110 12/25/2017   CO2 22 12/25/2017   Lab Results  Component Value Date   ALT 13 09/01/2017   AST 13 09/01/2017   ALKPHOS 83 09/01/2017   BILITOT 0.4 09/01/2017   Lab Results  Component Value Date   WBC 8.7 12/25/2017   HGB 12.3 12/25/2017   HCT 37.2 12/25/2017   MCV 95.4 12/25/2017   PLT 203 12/25/2017   Lab Results  Component Value Date   TSH 4.00 09/01/2017     Hx of paroxysmal a-fib -on brilinta  Hx of CAD (has px for nitroglycerin)  Also TIA  Patient Active Problem List   Diagnosis Date Noted  . Progressive angina ([place]) 12/25/2017  . CKD (chronic kidney disease), stage II 12/25/2017  . CAD (coronary artery disease) 12/24/2017  . Routine general medical examination at a health care facility 08/24/2016  . Paroxysmal atrial fibrillation ([place]) 04/07/2016    . Fall 08/13/2015  . Obesity (BMI 30-39.9) 08/23/2014  . Type 2 diabetes mellitus with diabetic peripheral angiopathy without gangrene, without long-term current use of insulin ([place]) 01/12/2014  . TIA (transient ischemic attack) 01/11/2014  . CVA (cerebral infarction) 01/11/2014  . LOOP Recorder LINQ 06/07/2013  . Fatigue 06/07/2013  . Sinusitis, chronic 05/16/2013  . H/O: CVA (cerebrovascular accident) 02/15/2013  . Encounter for Medicare annual wellness exam 10/27/2012  . Encopresis(307.7) 12/15/2011  . Post-menopausal 10/28/2011  . PERSONAL HX COLONIC POLYPS 09/04/2009  . B12 deficiency 03/06/2009  . ALLERGIC RHINITIS 02/14/2008  . BACK PAIN, LUMBAR 11/03/2007  . Hyperlipidemia 11/24/2006  . Essential hypertension 11/24/2006  . GERD 10/09/2006  . OVERACTIVE BLADDER 10/09/2006  . INCONTINENCE, URGE 10/09/2006  . SKIN CANCER, HX OF 10/09/2006   Past Medical History:  Diagnosis Date  . Allergy history, drug    Aspirin  . Basal cell carcinoma    "back"  . CAD (coronary artery disease)    a. Previously nonobstructive then progressive angina with abnl CT -> cardiac cath 12/24/17 showed 30% mid RCA and 80% prox-mid Cx. She received DES to mid AV groove Cx. EF  55-65%.   . Cervical stenosis of spine    With neck pain  . CKD (chronic kidney disease), stage II   . Colon polyps 2009  . Complication of anesthesia 1980s   slow to wake after anesthesia "when I had breast biopsy"  . GERD (gastroesophageal reflux disease)   . Hyperlipidemia    myalgias with Lipitor and Zetia  . Hypertension   . Migraine    "stopped at age 23" (12/24/2017)  . Squamous carcinoma    "iced off and cut off; mostly arms" (12/24/2017)  . Stroke [facility])    "told me I'd had 2 strokes in 02/2013"; denies residual on 12/24/2017  . TIA (transient ischemic attack)   . Type II diabetes mellitus ([place])    Past Surgical History:  Procedure Laterality Date  . ABD [place]  07/2003   Negative  . APPENDECTOMY    . BASAL  CELL CARCINOMA EXCISION     "back"  . BREAST BIOPSY Left 1992   benign  . CARDIAC CATHETERIZATION  04/07/2011   non obst CAD (Dr Burt Knack)  . CATARACT EXTRACTION W/PHACO Left 04/01/2017   Procedure: CATARACT EXTRACTION PHACO AND INTRAOCULAR LENS PLACEMENT ([place]) LEFT DIABETIC;  Surgeon: Leandrew Koyanagi, MD;  Location: [place];  Service: Ophthalmology;  Laterality: Left;  Diabetic - oral meds  . CATARACT EXTRACTION W/PHACO Right 04/29/2017   Procedure: CATARACT EXTRACTION PHACO AND INTRAOCULAR LENS PLACEMENT ([place]) RIGHT DIABETIC;  Surgeon: Leandrew Koyanagi, MD;  Location: [place];  Service: Ophthalmology;  Laterality: Right;  Diabetic - oral meds  . COLONOSCOPY  12/2007   Adenomatous colon polyps  . CORONARY ANGIOPLASTY WITH STENT PLACEMENT  12/24/2017  . CORONARY STENT INTERVENTION N/A 12/24/2017   Procedure: CORONARY STENT INTERVENTION;  Surgeon: Lorretta Harp, MD;  Location: [place] CV LAB;  Service: Cardiovascular;  Laterality: N/A;  . CYSTOSCOPY W/ DECANNULATION  03/2000   Normal  . LEFT HEART CATH AND CORONARY ANGIOGRAPHY N/A 12/24/2017   Procedure: LEFT HEART CATH AND CORONARY ANGIOGRAPHY;  Surgeon: Lorretta Harp, MD;  Location: [place] CV LAB;  Service: Cardiovascular;  Laterality: N/A;  . LOOP RECORDER IMPLANT N/A 02/16/2013   Procedure: LOOP RECORDER IMPLANT;  Surgeon: Deboraha Sprang, MD;  Location: [facility] CATH LAB;  Service: Cardiovascular;  Laterality: N/A;  . NASAL SINUS SURGERY  01/2005  . SQUAMOUS CELL CARCINOMA EXCISION     "mostly arms;" (12/24/2017)  . STRESS CARDIOLITE  11/1999   Normal/ negative  . TEAR DUCT PROBING  2005   "? side"  . TEE WITHOUT CARDIOVERSION N/A 02/16/2013   Procedure: TRANSESOPHAGEAL ECHOCARDIOGRAM (TEE);  Surgeon: Josue Hector, MD;  Location: [facility] ENDOSCOPY;  Service: Cardiovascular;  Laterality: N/A;  . TUBAL LIGATION     BTL   Social History   Tobacco Use  . Smoking status: Never Smoker  . Smokeless  tobacco: Never Used  Substance Use Topics  . Alcohol use: Never    Alcohol/week: 0.0 oz    Frequency: Never  . Drug use: Never   Family History  Problem Relation Age of Onset  . Lung cancer Brother   . Diabetes Brother   . Pancreatic cancer Brother   . Heart disease Mother   . Heart disease Father   . Brain cancer Other   . Skin cancer Daughter   . Diabetes Sister   . Colon cancer Neg Hx    Allergies  Allergen Reactions  . Bee Venom Hives, Shortness Of Breath and Swelling  .  Nabumetone Anaphylaxis  . Amoxicillin-Pot Clavulanate Hives and Swelling    To lips.  . Aspirin Hives  . Atorvastatin Swelling     joint pain/swelling, inc liver tests  . Clopidogrel Bisulfate Hives  . Codeine Nausea And Vomiting  . Ezetimibe Other (See Comments)     fatigue  . Metformin And Related Other (See Comments)    Diarrhea   . Valsartan Other (See Comments)     fatigue  . Other Hives    **Red Meat**  SOB   Current Outpatient Medications on File Prior to Visit  Medication Sig Dispense Refill  . diphenhydrAMINE (BENADRYL) 25 MG tablet Take 50 mg by mouth every 8 (eight) hours as needed for allergies.     Marland Kitchen EPINEPHrine (EPI-PEN) 0.3 mg/0.3 mL DEVI Inject 0.3 mg into the muscle daily as needed (allergic reaction).     . fluticasone (FLONASE) 50 MCG/ACT nasal spray Place 2 sprays into both nostrils 2 (two) times daily as needed. Reported on 09/10/2015    . glipiZIDE (GLUCOTROL XL) 10 MG 24 hr tablet Take 1 tablet (10 mg total) by mouth daily with breakfast. 90 tablet 3  . metFORMIN (GLUCOPHAGE-XR) 500 MG 24 hr tablet Take 1 tablet (500 mg total) by mouth daily with breakfast. 30 tablet 11  . nitroGLYCERIN (NITROSTAT) 0.4 MG SL tablet Place 1 tablet (0.4 mg total) under the tongue every 5 (five) minutes as needed for chest pain (up to 3 doses. If taking 3rd dose call 911). 25 tablet 3  . potassium chloride SA (K-DUR,KLOR-CON) 20 MEQ tablet Take 1 tablet (20 mEq total) by mouth daily. 90 tablet 3    . ticagrelor (BRILINTA) 90 MG TABS tablet Take 1 tablet (90 mg total) by mouth 2 (two) times daily. 60 tablet 11   No current facility-administered medications on file prior to visit.     Review of Systems  Constitutional: Positive for fatigue. Negative for activity change, appetite change, fever and unexpected weight change.  HENT: Negative for congestion, ear pain, rhinorrhea, sinus pressure and sore throat.   Eyes: Negative for pain, redness and visual disturbance.  Respiratory: Negative for cough, shortness of breath and wheezing.   Cardiovascular: Negative for chest pain and palpitations.  Gastrointestinal: Negative for abdominal pain, blood in stool, constipation and diarrhea.  Endocrine: Negative for polydipsia and polyuria.  Genitourinary: Negative for dysuria, frequency and urgency.  Musculoskeletal: Negative for arthralgias, back pain and myalgias.  Skin: Negative for pallor and rash.  Allergic/Immunologic: Negative for environmental allergies.  Neurological: Positive for light-headedness. Negative for dizziness, syncope and headaches.  Hematological: Negative for adenopathy. Does not bruise/bleed easily.  Psychiatric/Behavioral: Negative for decreased concentration and dysphoric mood. The patient is not nervous/anxious.        Stressors        Objective:   Physical Exam  Constitutional: She appears well-developed and well-nourished. No distress.  overwt and well app  HENT:  Head: Normocephalic and atraumatic.  Mouth/Throat: Oropharynx is clear and moist.  Eyes: Pupils are equal, round, and reactive to light. Conjunctivae and EOM are normal.  Neck: Normal range of motion. Neck supple. No JVD present. Carotid bruit is not present. No thyromegaly present.  Cardiovascular: Normal rate, regular rhythm, normal heart sounds and intact distal pulses. Exam reveals no gallop.  Pulmonary/Chest: Effort normal and breath sounds normal. No respiratory distress. She has no wheezes. She  has no rales.  No crackles  Abdominal: Soft. Bowel sounds are normal. She exhibits no distension, no abdominal bruit  and no mass. There is no tenderness.  Musculoskeletal: She exhibits no edema.  Lymphadenopathy:    She has no cervical adenopathy.  Neurological: She is alert. She has normal reflexes. She displays normal reflexes. No cranial nerve deficit. Coordination normal.  Skin: Skin is warm and dry. No rash noted.  Psychiatric: She has a normal mood and affect.          Assessment & Plan:   Problem List Items Addressed This Visit      Cardiovascular and Mediastinum   Essential hypertension - Primary    Some symptomatic HTN since stent placement  Will cut losartan-hct in half to 50-12.5 Pt monitors at home and will update [place] Disc parameters  Disc activity/ nutrition/fluid intake  Will update in a week      Relevant Medications   losartan-hydrochlorothiazide (HYZAAR) 50-12.5 MG tablet

## 2018-02-09 NOTE — Assessment & Plan Note (Signed)
Some symptomatic HTN since stent placement  Will cut losartan-hct in half to 50-12.5 Pt monitors at home and will update [place] Disc parameters  Disc activity/ nutrition/fluid intake  Will update in a week

## 2018-02-09 NOTE — Patient Instructions (Addendum)
Cut your blood pressure medicine in 1/2 (losartan hct)  Take 1/2 pill daily (the ones you have left)  The new px will be for 1/2 strength so you will take 1 pill daily   Alert [place] if blood pressure runs over 140 on the top or 90 on the bottom Stay active  Stay hydrated  Don't skip meals   Let me know if you do not feel better

## 2018-03-02 DIAGNOSIS — D0462 Carcinoma in situ of skin of left upper limb, including shoulder: Secondary | ICD-10-CM | POA: Diagnosis not present

## 2018-03-02 DIAGNOSIS — Z85828 Personal history of other malignant neoplasm of skin: Secondary | ICD-10-CM | POA: Diagnosis not present

## 2018-03-02 DIAGNOSIS — D485 Neoplasm of uncertain behavior of skin: Secondary | ICD-10-CM | POA: Diagnosis not present

## 2018-03-02 DIAGNOSIS — L570 Actinic keratosis: Secondary | ICD-10-CM | POA: Diagnosis not present

## 2018-03-02 DIAGNOSIS — X32XXXA Exposure to sunlight, initial encounter: Secondary | ICD-10-CM | POA: Diagnosis not present

## 2018-03-02 DIAGNOSIS — Z08 Encounter for follow-up examination after completed treatment for malignant neoplasm: Secondary | ICD-10-CM | POA: Diagnosis not present

## 2018-03-03 ENCOUNTER — Telehealth: Payer: Self-pay | Admitting: *Deleted

## 2018-03-03 NOTE — Telephone Encounter (Signed)
PRIMARY CARDIOLOGIST DR  [facility] Pre-operative Risk Assessment    Request for surgical clearance:  1. What type of surgery is being performed?  FIRST  PREMOLAR TOOTH#5 ;SINGLE TOOTH EXTRACTION  2. When is this surgery scheduled? TBD  3. What type of clearance is required (medical clearance vs. Pharmacy clearance to hold med vs. Both)? BOTH  4. Are there any medications that need to be held prior to surgery and how long? BRILINTA  5. Practice name and name of physician performing surgery? Harless Nakayama DDS  6. What is your office phone number [phone]    7.   What is your office fax number [phone]  8.   Anesthesia type (None, local, MAC, general) ? LOCAL ANEST.- LIDOCAINE 2% 1:100,000 EPI-TWO 1.8 ML CARPULES. SLIGHT SOFT TISSUE  REFLECTION AND WE WILL ACHIEVE ADEQUATE HEMOSTASIS OF THE EXTRACTION SITE BEFORE DISMISSING THE PAT/   Margaret Vazquez 03/03/2018, 5:06 PM  _________________________________________________________________   (provider comments below)

## 2018-03-04 NOTE — Telephone Encounter (Addendum)
   Patient had a cardiac cath June 2019 and a drug-eluting stent was inserted in the circumflex.  Because of medication intolerances, she is on Brilinta monotherapy.  She needs to continue on the Brilinta until June 2020.  Single dental extractions are normally done without discontinuing antiplatelet therapy or anticoagulation, it is hoped that you can go ahead and do the extraction.  If that is not possible, please recontact [place].  I will forward this to the requesting physician and remove from the preop pool.  Rosaria Ferries, PA-C 03/04/2018 4:52 PM

## 2018-04-28 ENCOUNTER — Ambulatory Visit: Payer: PPO | Admitting: Internal Medicine

## 2018-04-28 ENCOUNTER — Encounter: Payer: Self-pay | Admitting: Internal Medicine

## 2018-04-28 VITALS — BP 130/78 | HR 54 | Ht 68.0 in | Wt 191.8 lb

## 2018-04-28 DIAGNOSIS — E785 Hyperlipidemia, unspecified: Secondary | ICD-10-CM | POA: Diagnosis not present

## 2018-04-28 DIAGNOSIS — I639 Cerebral infarction, unspecified: Secondary | ICD-10-CM | POA: Diagnosis not present

## 2018-04-28 DIAGNOSIS — I2510 Atherosclerotic heart disease of native coronary artery without angina pectoris: Secondary | ICD-10-CM

## 2018-04-28 DIAGNOSIS — I10 Essential (primary) hypertension: Secondary | ICD-10-CM

## 2018-04-28 NOTE — Progress Notes (Signed)
[email or web address]      Patient Care Team: Tower, [name], MD as PCP - General Deboraha Sprang, MD as PCP - Cardiology (Cardiology) Dasher, Rayvon Char, MD as Consulting Physician (Dermatology) Salomon Fick., MD as Referring Physician (Dentistry) Leandrew Koyanagi, MD as Referring Physician (Ophthalmology)   HPI  Margaret Vazquez is a 81 y.o. female Seen in followup for a loop recorder implanted for cryptogenic stroke. She has significant hypertension which is much improved  She was found to have atrial fibrillation that prompted a visit to the atrial fibrillation clinic.This event occurred in the context of treatment anaphylaxis w epinephrine.. Discussion with Dr. Leonie Man suggested no indication for anticoagulation    She was found to have coronary artery disease based on abnormal FFR.  And her circumflex was stented    She has felt much better but still has some exercise intolerance.  No edema.    HR noted to be low at home   She finds that she can walk much better.  When she tries to walk fast however she had recurrent chest discomfort and dyspnea.   Long standing diabetes      She has Carotid disease but has been intolerant of statins      Past Medical History:  Diagnosis Date  . Allergy history, drug    Aspirin  . Basal cell carcinoma    "back"  . CAD (coronary artery disease)    a. Previously nonobstructive then progressive angina with abnl CT -> cardiac cath 12/24/17 showed 30% mid RCA and 80% prox-mid Cx. She received DES to mid AV groove Cx. EF 55-65%.   . Cervical stenosis of spine    With neck pain  . CKD (chronic kidney disease), stage II   . Colon polyps 2009  . Complication of anesthesia 1980s   slow to wake after anesthesia "when I had breast biopsy"  . GERD (gastroesophageal reflux disease)   . Hyperlipidemia    myalgias with Lipitor and Zetia  . Hypertension   . Migraine    "stopped at age 89" (12/24/2017)  . Squamous carcinoma    "iced off and cut off;  mostly arms" (12/24/2017)  . Stroke [facility])    "told me I'd had 2 strokes in 02/2013"; denies residual on 12/24/2017  . TIA (transient ischemic attack)   . Type II diabetes mellitus ([place])     Past Surgical History:  Procedure Laterality Date  . ABD [place]  07/2003   Negative  . APPENDECTOMY    . BASAL CELL CARCINOMA EXCISION     "back"  . BREAST BIOPSY Left 1992   benign  . CARDIAC CATHETERIZATION  04/07/2011   non obst CAD (Dr Burt Knack)  . CATARACT EXTRACTION W/PHACO Left 04/01/2017   Procedure: CATARACT EXTRACTION PHACO AND INTRAOCULAR LENS PLACEMENT ([place]) LEFT DIABETIC;  Surgeon: Leandrew Koyanagi, MD;  Location: [place];  Service: Ophthalmology;  Laterality: Left;  Diabetic - oral meds  . CATARACT EXTRACTION W/PHACO Right 04/29/2017   Procedure: CATARACT EXTRACTION PHACO AND INTRAOCULAR LENS PLACEMENT ([place]) RIGHT DIABETIC;  Surgeon: Leandrew Koyanagi, MD;  Location: [place];  Service: Ophthalmology;  Laterality: Right;  Diabetic - oral meds  . COLONOSCOPY  12/2007   Adenomatous colon polyps  . CORONARY ANGIOPLASTY WITH STENT PLACEMENT  12/24/2017  . CORONARY STENT INTERVENTION N/A 12/24/2017   Procedure: CORONARY STENT INTERVENTION;  Surgeon: Lorretta Harp, MD;  Location: [place] CV LAB;  Service: Cardiovascular;  Laterality: N/A;  . CYSTOSCOPY W/ DECANNULATION  03/2000   Normal  . LEFT HEART CATH AND CORONARY ANGIOGRAPHY N/A 12/24/2017   Procedure: LEFT HEART CATH AND CORONARY ANGIOGRAPHY;  Surgeon: Lorretta Harp, MD;  Location: [place] CV LAB;  Service: Cardiovascular;  Laterality: N/A;  . LOOP RECORDER IMPLANT N/A 02/16/2013   Procedure: LOOP RECORDER IMPLANT;  Surgeon: Deboraha Sprang, MD;  Location: [facility] CATH LAB;  Service: Cardiovascular;  Laterality: N/A;  . NASAL SINUS SURGERY  01/2005  . SQUAMOUS CELL CARCINOMA EXCISION     "mostly arms;" (12/24/2017)  . STRESS CARDIOLITE  11/1999   Normal/ negative  . TEAR DUCT PROBING  2005   "? side"    . TEE WITHOUT CARDIOVERSION N/A 02/16/2013   Procedure: TRANSESOPHAGEAL ECHOCARDIOGRAM (TEE);  Surgeon: Josue Hector, MD;  Location: [facility] ENDOSCOPY;  Service: Cardiovascular;  Laterality: N/A;  . TUBAL LIGATION     BTL    Current Outpatient Medications  Medication Sig Dispense Refill  . diphenhydrAMINE (BENADRYL) 25 MG tablet Take 50 mg by mouth every 8 (eight) hours as needed for allergies.     Marland Kitchen EPINEPHrine (EPI-PEN) 0.3 mg/0.3 mL DEVI Inject 0.3 mg into the muscle daily as needed (allergic reaction).     . fluticasone (FLONASE) 50 MCG/ACT nasal spray Place 2 sprays into both nostrils 2 (two) times daily as needed. Reported on 09/10/2015    . glipiZIDE (GLUCOTROL XL) 10 MG 24 hr tablet Take 1 tablet (10 mg total) by mouth daily with breakfast. 90 tablet 3  . losartan-hydrochlorothiazide (HYZAAR) 50-12.5 MG tablet Take 0.5 tablets by mouth daily.    . metFORMIN (GLUCOPHAGE-XR) 500 MG 24 hr tablet Take 1 tablet (500 mg total) by mouth daily with breakfast. 30 tablet 11  . nitroGLYCERIN (NITROSTAT) 0.4 MG SL tablet Place 1 tablet (0.4 mg total) under the tongue every 5 (five) minutes as needed for chest pain (up to 3 doses. If taking 3rd dose call 911). 25 tablet 3  . potassium chloride SA (K-DUR,KLOR-CON) 20 MEQ tablet Take 1 tablet (20 mEq total) by mouth daily. 90 tablet 3  . ticagrelor (BRILINTA) 90 MG TABS tablet Take 1 tablet (90 mg total) by mouth 2 (two) times daily. 60 tablet 11   No current facility-administered medications for this visit.     Allergies  Allergen Reactions  . Bee Venom Hives, Shortness Of Breath and Swelling  . Nabumetone Anaphylaxis  . Amoxicillin-Pot Clavulanate Hives and Swelling    To lips.  . Aspirin Hives  . Atorvastatin Swelling     joint pain/swelling, inc liver tests  . Clopidogrel Bisulfate Hives  . Codeine Nausea And Vomiting  . Ezetimibe Other (See Comments)     fatigue  . Metformin And Related Other (See Comments)    Diarrhea   . Valsartan  Other (See Comments)     fatigue  . Other Hives    **Red Meat**  SOB    Review of Systems negative except from HPI and PMH  Physical Exam BP 130/78   Pulse (!) 54   Ht 5\' 8"  (1.727 m)   Wt 191 lb 12.8 oz (87 kg)   SpO2 95%   BMI 29.16 kg/m  Well developed and nourished in no acute distress HENT normal Neck supple with JVP-flat Clear Regular rate and rhythm, no murmurs or gallops Abd-soft with active BS No Clubbing cyanosis edema Skin-warm and dry A & Oriented  Grossly normal sensory and motor function   ECG Personally reviewed  Sinus at 54 Interval 16/09/42  Walking in the office max heart rate 72  Assessment and  Plan  Cryptogenic stroke  Implantable loop recorder  Elevated blood pressure  Hyperlipidemia  Carotid disease  Coronary disease s/p Cx stenting  No interval atrial fibrillation  Dyspnea much improved following stenting.  Still having some discomfort with exuberant walking.  Differential could include chronotropic incompetence and microvascular disease.  We will have her undertake a trial of PRN nitroglycerin before she walks.  If that does not's abate, we will submit her for treadmill testing to look for heart rate excursion  Intolerant of statins  LDL elevated  Will refer to pharmacy for consideration of PSCK9 therapy

## 2018-04-28 NOTE — Patient Instructions (Signed)
Medication Instructions:  Your physician has recommended you make the following change in your medication:   1. Begin taking your nitroglycerin before you begin your walk. Let us know if this helps.   Labwork: None ordered.  Testing/Procedures: None ordered.  Follow-Up: Your physician recommends that you schedule a follow-up appointment in:   3 months with Almyra Deforest  12 months with Dr Caryl Comes  Any Other Special Instructions Will Be Listed Below (If Applicable).     If you need a refill on your cardiac medications before your next appointment, please call your pharmacy.

## 2018-05-05 DIAGNOSIS — D0462 Carcinoma in situ of skin of left upper limb, including shoulder: Secondary | ICD-10-CM | POA: Diagnosis not present

## 2018-05-17 ENCOUNTER — Other Ambulatory Visit: Payer: Self-pay | Admitting: Pharmacist

## 2018-05-17 NOTE — Patient Outreach (Addendum)
[facility]) Care Management  05/17/2018  ALOIS MINCER Feb 11, 1936 [id number]   Incoming call from Zandra Abts in response to the [facility] Medication Adherence Campaign. Speak with patient. HIPAA identifiers verified and verbal consent received.  Ms. Bucklin reports that she takes her losartan-hydrochlorothiazide 50-12.5 mg -1 tablet once daily as directed. Denies any missed doses or barriers to adherence. Reports that she takes her medication every morning with breakfast and checks her blood pressure as well. Reports that her blood pressure yesterday was 130/64, with a pulse of 60. Counsel about blood pressure management and monitoring technique.  Note that per Epic Medication list, patient's losartan-hydrochlorothiazide 50-12.5 mg direction is recorded as 0.5 tablet once daily. However, in patient's PCP note from 02/09/18, the provider indicated that she was going to cut the patient's previous prescription of losartan 100-25 mg once daily in half...which would be to a full tablet of the 50-12.5 mg daily. Note that the Epic Medication dispensing report confirms that the prescription by Dr. Glori Bickers was written as a full tablet of 50-12.5 mg daily and patient reviews her bottle and confirms that this is how it is written and how she is taking it as well. Update patient's Epic medication list accordingly.  Ms. Betton denies any further medication questions/concerns at this time. Will close pharmacy episode.  Harlow Asa, PharmD, [facility] [phone]

## 2018-05-28 DIAGNOSIS — H04223 Epiphora due to insufficient drainage, bilateral lacrimal glands: Secondary | ICD-10-CM | POA: Diagnosis not present

## 2018-06-08 ENCOUNTER — Other Ambulatory Visit (INDEPENDENT_AMBULATORY_CARE_PROVIDER_SITE_OTHER): Payer: PPO

## 2018-06-08 DIAGNOSIS — I10 Essential (primary) hypertension: Secondary | ICD-10-CM

## 2018-06-08 DIAGNOSIS — E1151 Type 2 diabetes mellitus with diabetic peripheral angiopathy without gangrene: Secondary | ICD-10-CM

## 2018-06-08 LAB — COMPREHENSIVE METABOLIC PANEL
ALT: 12 U/L (ref 0–35)
AST: 13 U/L (ref 0–37)
Albumin: 4 g/dL (ref 3.5–5.2)
Alkaline Phosphatase: 71 U/L (ref 39–117)
BUN: 20 mg/dL (ref 6–23)
CO2: 29 mEq/L (ref 19–32)
Calcium: 9.1 mg/dL (ref 8.4–10.5)
Chloride: 103 mEq/L (ref 96–112)
Creatinine, Ser: 1.03 mg/dL (ref 0.40–1.20)
GFR: 54.7 mL/min — ABNORMAL LOW (ref >60.00)
Glucose, Bld: 93 mg/dL (ref 70–99)
Potassium: 4 mEq/L (ref 3.5–5.1)
Sodium: 139 mEq/L (ref 135–145)
Total Bilirubin: 0.5 mg/dL (ref 0.2–1.2)
Total Protein: 7.1 g/dL (ref 6.0–8.3)

## 2018-06-08 LAB — HEMOGLOBIN A1C: Hgb A1c MFr Bld: 6.5 % (ref 4.6–6.5)

## 2018-06-08 LAB — LIPID PANEL
Cholesterol: 185 mg/dL (ref 0–200)
HDL: 42.6 mg/dL (ref >39.00)
LDL Cholesterol: 117 mg/dL — ABNORMAL HIGH (ref 0–99)
NonHDL: 142.8
Total CHOL/HDL Ratio: 4
Triglycerides: 129 mg/dL (ref 0.0–149.0)
VLDL: 25.8 mg/dL (ref 0.0–40.0)

## 2018-06-10 ENCOUNTER — Other Ambulatory Visit: Payer: PPO

## 2018-06-15 ENCOUNTER — Ambulatory Visit (INDEPENDENT_AMBULATORY_CARE_PROVIDER_SITE_OTHER): Payer: PPO | Admitting: Family Medicine

## 2018-06-15 ENCOUNTER — Encounter: Payer: Self-pay | Admitting: Family Medicine

## 2018-06-15 VITALS — BP 130/76 | HR 55 | Temp 97.8°F | Ht 68.0 in | Wt 191.4 lb

## 2018-06-15 DIAGNOSIS — I2510 Atherosclerotic heart disease of native coronary artery without angina pectoris: Secondary | ICD-10-CM | POA: Diagnosis not present

## 2018-06-15 DIAGNOSIS — I10 Essential (primary) hypertension: Secondary | ICD-10-CM | POA: Diagnosis not present

## 2018-06-15 DIAGNOSIS — E785 Hyperlipidemia, unspecified: Secondary | ICD-10-CM | POA: Diagnosis not present

## 2018-06-15 DIAGNOSIS — N182 Chronic kidney disease, stage 2 (mild): Secondary | ICD-10-CM | POA: Diagnosis not present

## 2018-06-15 DIAGNOSIS — B070 Plantar wart: Secondary | ICD-10-CM | POA: Diagnosis not present

## 2018-06-15 DIAGNOSIS — E1151 Type 2 diabetes mellitus with diabetic peripheral angiopathy without gangrene: Secondary | ICD-10-CM | POA: Diagnosis not present

## 2018-06-15 DIAGNOSIS — E1169 Type 2 diabetes mellitus with other specified complication: Secondary | ICD-10-CM | POA: Diagnosis not present

## 2018-06-15 NOTE — Patient Instructions (Addendum)
Re schedule your appointment with the cholesterol specialist please-this is important  I'm glad you are doing well   Continue current medicines  Get some compound W over the counter for your plantar wart   We will see you in march as planned

## 2018-06-15 NOTE — Assessment & Plan Note (Signed)
Renal labs reviewed Enc good fluid intake

## 2018-06-15 NOTE — Assessment & Plan Note (Signed)
Lab Results  Component Value Date   HGBA1C 6.5 06/08/2018   This is improved Eye and foot care is good (disc tx for plantar wart)  Continue better diet /exercise  F/u 6 mo  Commended for wt loss

## 2018-06-15 NOTE — Progress Notes (Signed)
Subjective:    Patient ID: Margaret Vazquez, female    DOB: [birth year], 82 y.o.   MRN: [id number]  HPI Here for f/u of chronic medical problems   Feeling very good lately  Looking forward to the holidays   Wt Readings from Last 3 Encounters:  06/15/18 191 lb 6.4 oz (86.8 kg)  04/28/18 191 lb 12.8 oz (87 kg)  02/09/18 192 lb 4 oz (87.2 kg)  stable  Taking good care of herself -walking (energy level is back after stents for CAD)  170s in June Has lost quite a bit of weight  29.10 kg/m   bp is stable today  No cp or palpitations or headaches or edema  No side effects to medicines  BP Readings from Last 3 Encounters:  06/15/18 130/76  04/28/18 130/78  02/09/18 116/68       Chemistry      Component Value Date/Time   NA 139 06/08/2018 0837   NA 141 12/22/2017 1611   NA 144 08/29/2011 2307   K 4.0 06/08/2018 0837   K 3.6 08/29/2011 2307   CL 103 06/08/2018 0837   CL 103 08/29/2011 2307   CO2 29 06/08/2018 0837   CO2 26 08/29/2011 2307   BUN 20 06/08/2018 0837   BUN 21 12/22/2017 1611   BUN 14 08/29/2011 2307   CREATININE 1.03 06/08/2018 0837   CREATININE 0.98 01/27/2014 1448      Component Value Date/Time   CALCIUM 9.1 06/08/2018 0837   CALCIUM 8.9 08/29/2011 2307   ALKPHOS 71 06/08/2018 0837   AST 13 06/08/2018 0837   ALT 12 06/08/2018 0837   BILITOT 0.5 06/08/2018 0837     quit drinking diet drinks- has water instead    DM2 Lab Results  Component Value Date   HGBA1C 6.5 06/08/2018  this is down from 6.8  Eye exam 6/19 neg Metformin xr and glipizide  Doing really well  Also eating very well   Hyperlipidemia Lab Results  Component Value Date   CHOL 185 06/08/2018   CHOL 200 09/01/2017   CHOL 189 03/02/2017   Lab Results  Component Value Date   HDL 42.60 06/08/2018   HDL 48.80 09/01/2017   HDL 47.20 03/02/2017   Lab Results  Component Value Date   LDLCALC 117 (H) 06/08/2018   LDLCALC 114 (H) 09/01/2017   LDLCALC 108 (H) 03/02/2017   Lab  Results  Component Value Date   TRIG 129.0 06/08/2018   TRIG 188.0 (H) 09/01/2017   TRIG 170.0 (H) 03/02/2017   Lab Results  Component Value Date   CHOLHDL 4 06/08/2018   CHOLHDL 4 09/01/2017   CHOLHDL 4 03/02/2017   Lab Results  Component Value Date   LDLDIRECT 107.0 08/27/2016   LDLDIRECT 136.5 10/20/2011   LDLDIRECT 134.7 08/08/2008  unfortunately intolerant of statins  Tried 4 of them Also did not tolerate zetia  Saw a lipid specialist at her cardiology office  Was going to talk about [place]  Patient Active Problem List   Diagnosis Date Noted  . Plantar wart, left foot 06/15/2018  . Progressive angina ([place]) 12/25/2017  . CKD (chronic kidney disease), stage II 12/25/2017  . CAD (coronary artery disease) 12/24/2017  . Routine general medical examination at a health care facility 08/24/2016  . Paroxysmal atrial fibrillation ([place]) 04/07/2016  . Fall 08/13/2015  . Obesity (BMI 30-39.9) 08/23/2014  . Type 2 diabetes mellitus with diabetic peripheral angiopathy without gangrene, without long-term current use of insulin ([place]) 01/12/2014  .  TIA (transient ischemic attack) 01/11/2014  . CVA (cerebral infarction) 01/11/2014  . LOOP Recorder LINQ 06/07/2013  . Fatigue 06/07/2013  . Sinusitis, chronic 05/16/2013  . H/O: CVA (cerebrovascular accident) 02/15/2013  . Encounter for Medicare annual wellness exam 10/27/2012  . Encopresis(307.7) 12/15/2011  . Post-menopausal 10/28/2011  . PERSONAL HX COLONIC POLYPS 09/04/2009  . B12 deficiency 03/06/2009  . ALLERGIC RHINITIS 02/14/2008  . BACK PAIN, LUMBAR 11/03/2007  . Hyperlipidemia associated with type 2 diabetes mellitus ([place]) 11/24/2006  . Essential hypertension 11/24/2006  . GERD 10/09/2006  . OVERACTIVE BLADDER 10/09/2006  . INCONTINENCE, URGE 10/09/2006  . SKIN CANCER, HX OF 10/09/2006   Past Medical History:  Diagnosis Date  . Allergy history, drug    Aspirin  . Basal cell carcinoma    "back"  . CAD (coronary  artery disease)    a. Previously nonobstructive then progressive angina with abnl CT -> cardiac cath 12/24/17 showed 30% mid RCA and 80% prox-mid Cx. She received DES to mid AV groove Cx. EF 55-65%.   . Cervical stenosis of spine    With neck pain  . CKD (chronic kidney disease), stage II   . Colon polyps 2009  . Complication of anesthesia 1980s   slow to wake after anesthesia "when I had breast biopsy"  . GERD (gastroesophageal reflux disease)   . Hyperlipidemia    myalgias with Lipitor and Zetia  . Hypertension   . Migraine    "stopped at age 79" (12/24/2017)  . Squamous carcinoma    "iced off and cut off; mostly arms" (12/24/2017)  . Stroke [facility])    "told me I'd had 2 strokes in 02/2013"; denies residual on 12/24/2017  . TIA (transient ischemic attack)   . Type II diabetes mellitus ([place])    Past Surgical History:  Procedure Laterality Date  . ABD [place]  07/2003   Negative  . APPENDECTOMY    . BASAL CELL CARCINOMA EXCISION     "back"  . BREAST BIOPSY Left 1992   benign  . CARDIAC CATHETERIZATION  04/07/2011   non obst CAD (Dr Burt Knack)  . CATARACT EXTRACTION W/PHACO Left 04/01/2017   Procedure: CATARACT EXTRACTION PHACO AND INTRAOCULAR LENS PLACEMENT ([place]) LEFT DIABETIC;  Surgeon: Leandrew Koyanagi, MD;  Location: [facility];  Service: Ophthalmology;  Laterality: Left;  Diabetic - oral meds  . CATARACT EXTRACTION W/PHACO Right 04/29/2017   Procedure: CATARACT EXTRACTION PHACO AND INTRAOCULAR LENS PLACEMENT ([place]) RIGHT DIABETIC;  Surgeon: Leandrew Koyanagi, MD;  Location: [place] [facility];  Service: Ophthalmology;  Laterality: Right;  Diabetic - oral meds  . COLONOSCOPY  12/2007   Adenomatous colon polyps  . CORONARY ANGIOPLASTY WITH STENT PLACEMENT  12/24/2017  . CORONARY STENT INTERVENTION N/A 12/24/2017   Procedure: CORONARY STENT INTERVENTION;  Surgeon: Lorretta Harp, MD;  Location: [place] [facility];  Service: Cardiovascular;  Laterality: N/A;  . CYSTOSCOPY W/  DECANNULATION  03/2000   Normal  . LEFT HEART CATH AND CORONARY ANGIOGRAPHY N/A 12/24/2017   Procedure: LEFT HEART CATH AND CORONARY ANGIOGRAPHY;  Surgeon: Lorretta Harp, MD;  Location: [place] [facility];  Service: Cardiovascular;  Laterality: N/A;  . LOOP RECORDER IMPLANT N/A 02/16/2013   Procedure: LOOP RECORDER IMPLANT;  Surgeon: Deboraha Sprang, MD;  Location: [facility] CATH LAB;  Service: Cardiovascular;  Laterality: N/A;  . NASAL SINUS SURGERY  01/2005  . SQUAMOUS CELL CARCINOMA EXCISION     "mostly arms;" (12/24/2017)  . STRESS CARDIOLITE  11/1999   Normal/ negative  .  TEAR DUCT PROBING  2005   "? side"  . TEE WITHOUT CARDIOVERSION N/A 02/16/2013   Procedure: TRANSESOPHAGEAL ECHOCARDIOGRAM (TEE);  Surgeon: Josue Hector, MD;  Location: [facility] ENDOSCOPY;  Service: Cardiovascular;  Laterality: N/A;  . TUBAL LIGATION     BTL   Social History   Tobacco Use  . Smoking status: Never Smoker  . Smokeless tobacco: Never Used  Substance Use Topics  . Alcohol use: Never    Alcohol/week: 0.0 standard drinks    Frequency: Never  . Drug use: Never   Family History  Problem Relation Age of Onset  . Lung cancer Brother   . Diabetes Brother   . Pancreatic cancer Brother   . Heart disease Mother   . Heart disease Father   . Brain cancer Other   . Skin cancer Daughter   . Diabetes Sister   . Colon cancer Neg Hx    Allergies  Allergen Reactions  . Bee Venom Hives, Shortness Of Breath and Swelling  . Nabumetone Anaphylaxis  . Amoxicillin-Pot Clavulanate Hives and Swelling    To lips.  . Aspirin Hives  . Atorvastatin Swelling     joint pain/swelling, inc liver tests  . Clopidogrel Bisulfate Hives  . Codeine Nausea And Vomiting  . Ezetimibe Other (See Comments)     fatigue  . Metformin And Related Other (See Comments)    Diarrhea   . Valsartan Other (See Comments)     fatigue  . Other Hives    **Red Meat**  SOB   Current Outpatient Medications on File Prior to Visit  Medication  Sig Dispense Refill  . diphenhydrAMINE (BENADRYL) 25 MG tablet Take 50 mg by mouth every 8 (eight) hours as needed for allergies.     Marland Kitchen EPINEPHrine (EPI-PEN) 0.3 mg/0.3 mL DEVI Inject 0.3 mg into the muscle daily as needed (allergic reaction).     . fluticasone (FLONASE) 50 MCG/ACT nasal spray Place 2 sprays into both nostrils 2 (two) times daily as needed. Reported on 09/10/2015    . glipiZIDE (GLUCOTROL XL) 10 MG 24 hr tablet Take 1 tablet (10 mg total) by mouth daily with breakfast. 90 tablet 3  . losartan-hydrochlorothiazide (HYZAAR) 50-12.5 MG tablet Take 1 tablet by mouth daily.     . metFORMIN (GLUCOPHAGE-XR) 500 MG 24 hr tablet Take 1 tablet (500 mg total) by mouth daily with breakfast. 30 tablet 11  . nitroGLYCERIN (NITROSTAT) 0.4 MG SL tablet Place 1 tablet (0.4 mg total) under the tongue every 5 (five) minutes as needed for chest pain (up to 3 doses. If taking 3rd dose call 911). 25 tablet 3  . potassium chloride SA (K-DUR,KLOR-CON) 20 MEQ tablet Take 1 tablet (20 mEq total) by mouth daily. 90 tablet 3  . prednisoLONE acetate (PRED FORTE) 1 % ophthalmic suspension SHAKE LQ AND INT 1 GTT IN OU BID FOR 10 DAYS  0  . ticagrelor (BRILINTA) 90 MG TABS tablet Take 1 tablet (90 mg total) by mouth 2 (two) times daily. 60 tablet 11   No current facility-administered medications on file prior to visit.     Review of Systems  Constitutional: Negative for activity change, appetite change, fatigue, fever and unexpected weight change.  HENT: Negative for congestion, ear pain, rhinorrhea, sinus pressure and sore throat.   Eyes: Negative for pain, redness and visual disturbance.  Respiratory: Negative for cough, shortness of breath and wheezing.   Cardiovascular: Negative for chest pain and palpitations.  Gastrointestinal: Negative for abdominal pain, blood in  stool, constipation and diarrhea.  Endocrine: Negative for polydipsia and polyuria.  Genitourinary: Negative for dysuria, frequency and  urgency.  Musculoskeletal: Negative for arthralgias, back pain and myalgias.  Skin: Negative for pallor and rash.       Something on bottom of L foot- hurts   Allergic/Immunologic: Negative for environmental allergies.  Neurological: Negative for dizziness, syncope and headaches.  Hematological: Negative for adenopathy. Does not bruise/bleed easily.  Psychiatric/Behavioral: Negative for decreased concentration and dysphoric mood. The patient is not nervous/anxious.        Objective:   Physical Exam  Constitutional: She appears well-developed and well-nourished. No distress.  overwt and well app Wt loss noted   HENT:  Head: Normocephalic and atraumatic.  Mouth/Throat: Oropharynx is clear and moist.  Eyes: Pupils are equal, round, and reactive to light. Conjunctivae and EOM are normal.  Neck: Normal range of motion. Neck supple. No JVD present. Carotid bruit is not present. No thyromegaly present.  Cardiovascular: Normal rate, regular rhythm, normal heart sounds and intact distal pulses. Exam reveals no gallop.  Pulmonary/Chest: Effort normal and breath sounds normal. No stridor. No respiratory distress. She has no wheezes. She has no rales.  No crackles  Abdominal: Soft. Bowel sounds are normal. She exhibits no distension, no abdominal bruit and no mass. There is no tenderness.  Musculoskeletal: She exhibits no edema.  Lymphadenopathy:    She has no cervical adenopathy.  Neurological: She is alert. She has normal reflexes.  Skin: Skin is warm and dry. No rash noted.  Small plantar wart in arch of L foot    Psychiatric: She has a normal mood and affect.  Mood is good           Assessment & Plan:   Problem List Items Addressed This Visit      Cardiovascular and Mediastinum   Type 2 diabetes mellitus with diabetic peripheral angiopathy without gangrene, without long-term current use of insulin ([place])    Lab Results  Component Value Date   HGBA1C 6.5 06/08/2018   This is  improved Eye and foot care is good (disc tx for plantar wart)  Continue better diet /exercise  F/u 6 mo  Commended for wt loss       Essential hypertension   CAD (coronary artery disease)    S/p stent placement and feeling much better Enc her to f/u with the lipid clinic re cholesterol and intolerance of medications         Endocrine   Hyperlipidemia associated with type 2 diabetes mellitus ([place]) - Primary    LDL not at goal for pt with DM and CAD Disc goals for lipids and reasons to control them Rev last labs with pt Rev low sat fat diet in detail Enc her strongly to f/u with lipid clinic as advised Intol of 4 statins and zetia ? If candidate for pCYK9 inhibitor         Musculoskeletal and Integument   Plantar wart, left foot    consv tx recommended Start with compound W otc No neuropathy        Genitourinary   CKD (chronic kidney disease), stage II    Renal labs reviewed Enc good fluid intake

## 2018-06-15 NOTE — Assessment & Plan Note (Signed)
consv tx recommended Start with compound W otc No neuropathy

## 2018-06-15 NOTE — Assessment & Plan Note (Signed)
LDL not at goal for pt with DM and CAD Disc goals for lipids and reasons to control them Rev last labs with pt Rev low sat fat diet in detail Enc her strongly to f/u with lipid clinic as advised Intol of 4 statins and zetia ? If candidate for pCYK9 inhibitor

## 2018-06-15 NOTE — Assessment & Plan Note (Signed)
S/p stent placement and feeling much better Enc her to f/u with the lipid clinic re cholesterol and intolerance of medications

## 2018-07-06 ENCOUNTER — Encounter: Payer: Self-pay | Admitting: Physician Assistant

## 2018-07-06 ENCOUNTER — Ambulatory Visit: Payer: PPO | Admitting: Physician Assistant

## 2018-07-06 VITALS — BP 144/72 | HR 58 | Ht 68.0 in | Wt 192.8 lb

## 2018-07-06 DIAGNOSIS — E785 Hyperlipidemia, unspecified: Secondary | ICD-10-CM | POA: Diagnosis not present

## 2018-07-06 DIAGNOSIS — I2510 Atherosclerotic heart disease of native coronary artery without angina pectoris: Secondary | ICD-10-CM | POA: Diagnosis not present

## 2018-07-06 DIAGNOSIS — I10 Essential (primary) hypertension: Secondary | ICD-10-CM

## 2018-07-06 DIAGNOSIS — E119 Type 2 diabetes mellitus without complications: Secondary | ICD-10-CM | POA: Diagnosis not present

## 2018-07-06 DIAGNOSIS — I480 Paroxysmal atrial fibrillation: Secondary | ICD-10-CM

## 2018-07-06 DIAGNOSIS — Z8673 Personal history of transient ischemic attack (TIA), and cerebral infarction without residual deficits: Secondary | ICD-10-CM

## 2018-07-06 NOTE — Patient Instructions (Signed)
Medication Instructions:  The current medical regimen is effective;  continue present plan and medications.  If you need a refill on your cardiac medications before your next appointment, please call your pharmacy.   Follow-Up: At [facility], you and your health needs are our priority.  As part of our continuing mission to provide you with exceptional heart care, we have created designated Provider Care Teams.  These Care Teams include your primary Cardiologist (physician) and Advanced Practice Providers (APPs -  Physician Assistants and Nurse Practitioners) who all work together to provide you with the care you need, when you need it. . Follow up with Dr.Klein in August 2020. Call 3 months before to schedule.  Any Other Special Instructions Will Be Listed Below (If Applicable). Please call us if you do not hear from the clinical trial before mid January.

## 2018-07-06 NOTE — Progress Notes (Signed)
Cardiology Office Note    Date:  07/06/2018   ID:  Margaret Vazquez, DOB Jan 21, 1936, MRN [id number]  PCP:  Abner Greenspan, MD  Cardiologist:  Dr. Caryl Comes   Chief Complaint  Patient presents with  . Follow-up    seen for Dr. Caryl Comes.     History of Present Illness:  KAREN SARRATT is a 82 y.o. female with PMH of CAD, PAF, DM II, HLDintolerant to Lipitor and Zetia, hypertension and history of CVA s/p loop recorder. Previous cardiac catheterization in 2012 showed very mild nonobstructive disease. Patient had a loop recorder placed previously for cryptogenic stroke, this revealed atrial fibrillation. This episode of atrial fibrillation occurred in the context of treatment of anaphylaxis with epinephrine. After discussing with Dr. Leonie Man of neurology service, it was suggested there was no indication for anticoagulation for solitary episode. She has been followed by Dr. Caryl Comes. During the recent office visit in April 2019, she complained of exertional shortness of breath and a chest discomfort that has been ongoing for the past several months. She says she would noticed a chest tightness after walking around for 15 minutes prompting her to stop. She is currently not on aspirin given history of allergy. She also cannot take Plavix either due to hives. She eventually underwent coronary CT which showed potential obstructive disease in the proximal to mid LAD and also diagonal area with abnormal FFR. On the initial coronary CT, there was also mention of severe left circumflex disease, however FFR of this area was normal.  She eventually underwent cardiac catheterization on 12/24/2017 which showed a 30% mid LAD lesion, 80% proximal to mid left circumflex lesion treated with DES, EF 55 to 65%.  Patient was last seen by Dr. Caryl Comes on 04/28/2018, at which time she was doing well but occasionally experience chest discomfort with more strenuous activity but not with every day activity.  Dr. Caryl Comes recommended a  trial of nitroglycerin before strenuous activity, however if this does not resolve her issue may consider GXT to rule out chronotropic incompetence.  She presents today for follow-up, in the past 2 months, she has only experienced chest pain and the needed to use nitroglycerin once.  She has not had any further issues.  I will hold off on GXT at this time.  Otherwise she denies any dizziness, blurred vision or feeling of passing out.  There has been no lower extremity edema.  She was initially referred to the lipid clinic for consideration of PCSK9 inhibitor, however she was late to the appointment.  She says that she was contacted by someone from the hospital regarding a clinical trial.  However I do not see telephone note regarding such clinical trial.  If she is still does not receive any phone call regarding the clinical trial by mid January, I recommended for her to contact us so we can set her up with PCSK9 inhibitor in the lipid clinic again.  Her blood pressure is borderline elevated today, she is only taking half of the tablet of losartan-hydrochlorothiazide.  Apparently she had significant weakness in the dizziness on the previous dose, I will continue her on the lower dose of blood pressure control.    Past Medical History:  Diagnosis Date  . Allergy history, drug    Aspirin  . Basal cell carcinoma    "back"  . CAD (coronary artery disease)    a. Previously nonobstructive then progressive angina with abnl CT -> cardiac cath 12/24/17 showed 30% mid RCA and  80% prox-mid Cx. She received DES to mid AV groove Cx. EF 55-65%.   . Cervical stenosis of spine    With neck pain  . CKD (chronic kidney disease), stage II   . Colon polyps 2009  . Complication of anesthesia 1980s   slow to wake after anesthesia "when I had breast biopsy"  . GERD (gastroesophageal reflux disease)   . Hyperlipidemia    myalgias with Lipitor and Zetia  . Hypertension   . Migraine    "stopped at age 44" (12/24/2017)   . Squamous carcinoma    "iced off and cut off; mostly arms" (12/24/2017)  . Stroke [facility])    "told me I'd had 2 strokes in 02/2013"; denies residual on 12/24/2017  . TIA (transient ischemic attack)   . Type II diabetes mellitus ([place])     Past Surgical History:  Procedure Laterality Date  . ABD [place]  07/2003   Negative  . APPENDECTOMY    . BASAL CELL CARCINOMA EXCISION     "back"  . BREAST BIOPSY Left 1992   benign  . CARDIAC CATHETERIZATION  04/07/2011   non obst CAD (Dr Burt Knack)  . CATARACT EXTRACTION W/PHACO Left 04/01/2017   Procedure: CATARACT EXTRACTION PHACO AND INTRAOCULAR LENS PLACEMENT ([place]) LEFT DIABETIC;  Surgeon: Leandrew Koyanagi, MD;  Location: [place];  Service: Ophthalmology;  Laterality: Left;  Diabetic - oral meds  . CATARACT EXTRACTION W/PHACO Right 04/29/2017   Procedure: CATARACT EXTRACTION PHACO AND INTRAOCULAR LENS PLACEMENT ([place]) RIGHT DIABETIC;  Surgeon: Leandrew Koyanagi, MD;  Location: [place];  Service: Ophthalmology;  Laterality: Right;  Diabetic - oral meds  . COLONOSCOPY  12/2007   Adenomatous colon polyps  . CORONARY ANGIOPLASTY WITH STENT PLACEMENT  12/24/2017  . CORONARY STENT INTERVENTION N/A 12/24/2017   Procedure: CORONARY STENT INTERVENTION;  Surgeon: Lorretta Harp, MD;  Location: [place] CV LAB;  Service: Cardiovascular;  Laterality: N/A;  . CYSTOSCOPY W/ DECANNULATION  03/2000   Normal  . LEFT HEART CATH AND CORONARY ANGIOGRAPHY N/A 12/24/2017   Procedure: LEFT HEART CATH AND CORONARY ANGIOGRAPHY;  Surgeon: Lorretta Harp, MD;  Location: [place] CV LAB;  Service: Cardiovascular;  Laterality: N/A;  . LOOP RECORDER IMPLANT N/A 02/16/2013   Procedure: LOOP RECORDER IMPLANT;  Surgeon: Deboraha Sprang, MD;  Location: [facility] CATH LAB;  Service: Cardiovascular;  Laterality: N/A;  . NASAL SINUS SURGERY  01/2005  . SQUAMOUS CELL CARCINOMA EXCISION     "mostly arms;" (12/24/2017)  . STRESS CARDIOLITE  11/1999   Normal/  negative  . TEAR DUCT PROBING  2005   "? side"  . TEE WITHOUT CARDIOVERSION N/A 02/16/2013   Procedure: TRANSESOPHAGEAL ECHOCARDIOGRAM (TEE);  Surgeon: Josue Hector, MD;  Location: [facility] ENDOSCOPY;  Service: Cardiovascular;  Laterality: N/A;  . TUBAL LIGATION     BTL    Current Medications: Outpatient Medications Prior to Visit  Medication Sig Dispense Refill  . diphenhydrAMINE (BENADRYL) 25 MG tablet Take 50 mg by mouth every 8 (eight) hours as needed for allergies.     Marland Kitchen EPINEPHrine (EPI-PEN) 0.3 mg/0.3 mL DEVI Inject 0.3 mg into the muscle daily as needed (allergic reaction).     . fluticasone (FLONASE) 50 MCG/ACT nasal spray Place 2 sprays into both nostrils 2 (two) times daily as needed. Reported on 09/10/2015    . glipiZIDE (GLUCOTROL XL) 10 MG 24 hr tablet Take 1 tablet (10 mg total) by mouth daily with breakfast. 90 tablet 3  . losartan-hydrochlorothiazide (HYZAAR)  50-12.5 MG tablet Take 1 tablet by mouth daily.     . metFORMIN (GLUCOPHAGE-XR) 500 MG 24 hr tablet Take 1 tablet (500 mg total) by mouth daily with breakfast. 30 tablet 11  . nitroGLYCERIN (NITROSTAT) 0.4 MG SL tablet Place 1 tablet (0.4 mg total) under the tongue every 5 (five) minutes as needed for chest pain (up to 3 doses. If taking 3rd dose call 911). 25 tablet 3  . potassium chloride SA (K-DUR,KLOR-CON) 20 MEQ tablet Take 1 tablet (20 mEq total) by mouth daily. 90 tablet 3  . prednisoLONE acetate (PRED FORTE) 1 % ophthalmic suspension SHAKE LQ AND INT 1 GTT IN OU BID FOR 10 DAYS  0  . ticagrelor (BRILINTA) 90 MG TABS tablet Take 1 tablet (90 mg total) by mouth 2 (two) times daily. 60 tablet 11   No facility-administered medications prior to visit.      Allergies:   Bee venom; Nabumetone; Amoxicillin-pot clavulanate; Aspirin; Atorvastatin; Clopidogrel bisulfate; Codeine; Ezetimibe; Metformin and related; Valsartan; and Other   Social History   Socioeconomic History  . Marital status: Married    Spouse name: Eddie Dibbles    . Number of children: 3  . Years of education: 60  . Highest education level: Not on file  Occupational History  . Occupation: Retired    Fish farm manager: RETIRED  Social Needs  . Financial resource strain: Not on file  . Food insecurity:    Worry: Not on file    Inability: Not on file  . Transportation needs:    Medical: Not on file    Non-medical: Not on file  Tobacco Use  . Smoking status: Never Smoker  . Smokeless tobacco: Never Used  Substance and Sexual Activity  . Alcohol use: Never    Alcohol/week: 0.0 standard drinks    Frequency: Never  . Drug use: Never  . Sexual activity: Not Currently  Lifestyle  . Physical activity:    Days per week: Not on file    Minutes per session: Not on file  . Stress: Not on file  Relationships  . Social connections:    Talks on phone: Not on file    Gets together: Not on file    Attends religious service: Not on file    Active member of club or organization: Not on file    Attends meetings of clubs or organizations: Not on file    Relationship status: Not on file  Other Topics Concern  . Not on file  Social History Narrative   3 children   Does not drink caffeinated beverages   Cares for SIL with dementia     Family History:  The patient's family history includes Brain cancer in an other family member; Diabetes in her brother and sister; Heart disease in her father and mother; Lung cancer in her brother; Pancreatic cancer in her brother; Skin cancer in her daughter.   ROS:   Please see the history of present illness.    ROS All other systems reviewed and are negative.   PHYSICAL EXAM:   VS:  BP (!) 144/72   Pulse (!) 58   Ht 5' 8"$  (1.727 m)   Wt 192 lb 12.8 oz (87.5 kg)   BMI 29.32 kg/m    GEN: Well nourished, well developed, in no acute distress  HEENT: normal  Neck: no JVD, carotid bruits, or masses Cardiac: RRR; no murmurs, rubs, or gallops,no edema  Respiratory:  clear to auscultation bilaterally, normal work of  breathing GI: soft,  nontender, nondistended, + BS MS: no deformity or atrophy  Skin: warm and dry, no rash Neuro:  Alert and Oriented x 3, Strength and sensation are intact Psych: euthymic mood, full affect  Wt Readings from Last 3 Encounters:  07/06/18 192 lb 12.8 oz (87.5 kg)  06/15/18 191 lb 6.4 oz (86.8 kg)  04/28/18 191 lb 12.8 oz (87 kg)      Studies/Labs Reviewed:   EKG:  EKG is not ordered today.   Recent Labs: 09/01/2017: TSH 4.00 12/25/2017: Hemoglobin 12.3; Platelets 203 06/08/2018: ALT 12; BUN 20; Creatinine, Ser 1.03; Potassium 4.0; Sodium 139   Lipid Panel    Component Value Date/Time   CHOL 185 06/08/2018 0837   TRIG 129.0 06/08/2018 0837   HDL 42.60 06/08/2018 0837   CHOLHDL 4 06/08/2018 0837   VLDL 25.8 06/08/2018 0837   LDLCALC 117 (H) 06/08/2018 0837   LDLDIRECT 107.0 08/27/2016 0948    Additional studies/ records that were reviewed today include:   Cath 12/24/2017  Mid RCA lesion is 30% stenosed.  Prox Cx to Mid Cx lesion is 80% stenosed.  A stent was successfully placed.  Post intervention, there is a 0% residual stenosis.  The left ventricular systolic function is normal.  LV end diastolic pressure is normal.  The left ventricular ejection fraction is 55-65% by visual estimate.    ASSESSMENT:    1. Coronary artery disease involving native coronary artery of native heart without angina pectoris   2. Hyperlipidemia LDL goal <70   3. Controlled type 2 diabetes mellitus without complication, without long-term current use of insulin ([place])   4. PAF (paroxysmal atrial fibrillation) ([place])   5. Essential hypertension   6. H/O: CVA (cerebrovascular accident)      PLAN:  In order of problems listed above:  1. CAD: In the past 2 months, she has only used nitroglycerin once.  We will continue on the Brilinta.  Not on aspirin given the history of intolerance.  2. Hyperlipidemia: She was referred to the lipid clinic, however showed up late.   She later got in touch with someone from the hospital regarding a potential clinical trial.  She says that she was supposed to be contacted in early January to start on the clinical trial, I am unable to locate any such record in the epic system.  Once she has more information, she will need to let us know.  Ideally she needs to be could started on PCSK9 inhibitor.  LDL goal less than 70.  3. Hypertension: Blood pressure borderline elevated, she is only taking half of the losartan-hydrochlorothiazide.  Apparently the previous full dose was causing her some dizziness.  Dizziness has resolved after reducing the dosage of her blood pressure medication  4. PAF: Solitary episode occurred in the setting of epinephrine use for anaphylaxis.  No plan for anticoagulation unless recurrence.  5. Ectropion: Patient says she is consider potential surgery for her ectropion of eyelid, she is aware that she will need to continue Brilinta for at least 1 year unless surgery is absolutely urgent.  The earliest that she can come off Brilinta will be after June 20 of 2020.  6. DM2: managed by primary care provider  7. History of CVA: Continue Brilinta.  After 1 year of Brilinta, may need to consider continue Brilinta long-term on a lower dose of 60 mg twice daily given her history of stroke.    Medication Adjustments/Labs and Tests Ordered: Current medicines are reviewed at length with the patient  today.  Concerns regarding medicines are outlined above.  Medication changes, Labs and Tests ordered today are listed in the Patient Instructions below. Patient Instructions  Medication Instructions:  The current medical regimen is effective;  continue present plan and medications.  If you need a refill on your cardiac medications before your next appointment, please call your pharmacy.   Follow-Up: At [facility], you and your health needs are our priority.  As part of our continuing mission to provide you with  exceptional heart care, we have created designated Provider Care Teams.  These Care Teams include your primary Cardiologist (physician) and Advanced Practice Providers (APPs -  Physician Assistants and Nurse Practitioners) who all work together to provide you with the care you need, when you need it. . Follow up with Dr.Klein in August 2020. Call 3 months before to schedule.  Any Other Special Instructions Will Be Listed Below (If Applicable). Please call us if you do not hear from the clinical trial before mid January.       Hilbert Corrigan, [place]  07/06/2018 10:23 AM    [address], [place], [place]  [address] Phone: [phone]; Fax: [phone]

## 2018-07-13 ENCOUNTER — Telehealth: Payer: Self-pay

## 2018-07-13 NOTE — Telephone Encounter (Signed)
Agree with advisement  Thanks

## 2018-07-13 NOTE — Telephone Encounter (Signed)
Patient calls in to report that she started feeling cold, with chills and the shakes this afternoon after leaving a funeral.  She states that she just can't get warm and is wondering what to do.  She has been in the hospital/ER with her husband who has pneumonia and is concerned and has been exposed to numerous germs.  Triage Assessment:  Blood sugar: 90's BP: 189/87, HR 76 Temp: 99.6  Lung: no SOB, wheezing, cough or congestion Cardiac: denies chest pain, palpitations, no report of edema.  GI/GU: WNL, no dysuria, nausea/vomitting or pain  EENT: Reports she does have increased nasal congestion and drainage.  But no sore throat and no cough and no ear pain or pressure.   Skin/musculoskeletal: Cold, chills "cannot get warm" and body aches.   Neuro: Alert and oriented, no dizziness, numbness/tingling or visual changes.  Denies HA.    Husband is coming in tomorrow to see Dr. Glori Bickers, if she is no better and/or getting worse, she would like to be seen.  Patient is going to take 2 Tylenol this pm, rest and increase fluid intake.    I did set her up an acute appointment with Debbie tomorrow at 330.  She is to call and cancel first thing in am if feeling better.  Also, if symptoms progress with elevating bp, sob, chest pain etc then patient is to go on to the ER tonight for eval.  Patient verbalizes understanding and will follow up with [place] tomorrow.  Dr. Glori Bickers, please advise if you wish anything different for patient.  Also, I will copy to Tor Netters, NP as patient will be seeing her acutely tomorrow if no improvement with symptoms.

## 2018-07-14 ENCOUNTER — Ambulatory Visit: Payer: PPO | Admitting: Family Medicine

## 2018-07-14 ENCOUNTER — Encounter: Payer: Self-pay | Admitting: Family Medicine

## 2018-07-14 ENCOUNTER — Ambulatory Visit (INDEPENDENT_AMBULATORY_CARE_PROVIDER_SITE_OTHER): Payer: PPO | Admitting: Family Medicine

## 2018-07-14 VITALS — BP 120/64 | HR 65 | Temp 98.1°F | Ht 68.0 in | Wt 191.8 lb

## 2018-07-14 DIAGNOSIS — B9789 Other viral agents as the cause of diseases classified elsewhere: Secondary | ICD-10-CM | POA: Diagnosis not present

## 2018-07-14 DIAGNOSIS — J069 Acute upper respiratory infection, unspecified: Secondary | ICD-10-CM | POA: Diagnosis not present

## 2018-07-14 NOTE — Telephone Encounter (Signed)
Noted  

## 2018-07-14 NOTE — Patient Instructions (Signed)
Good to see you today  I think you have a virus  Drink plenty of water  Use saline nasal spray 3-5 times a day for nasal congestion  You can take Tylenol 2 tablets as needed twice a day for fever/chills, pain

## 2018-07-14 NOTE — Progress Notes (Signed)
Subjective:    Patient ID: Margaret Vazquez, female    DOB: 1935/07/13, 83 y.o.   MRN: [id number]  HPI This is an 83 yo female who presents today with nasal congestion. Had a cough x 2 days last week, yellow phlegm, this resolved. Now with two days of nasal drainage, yellow- bloody nasal drainage. Yesterday was at a funeral and had an episode of chills and temp up to 100.1. Took two tylenol x1 yesterday. No fever today. Feels sore in her back. No sinus pressure. Roaring in her ears. No SOB/wheeze. Good fluid intake. Feels tired. Husband seeing Dr. Glori Bickers for follow up of pneumonia.  Right eye red, went to eye doctor, needs eyelid surgery, has blocked duct. Can't have surgery until after June due to stents. Was recently started on eye ointment. No pain.   Past Medical History:  Diagnosis Date  . Allergy history, drug    Aspirin  . Basal cell carcinoma    "back"  . CAD (coronary artery disease)    a. Previously nonobstructive then progressive angina with abnl CT -> cardiac cath 12/24/17 showed 30% mid RCA and 80% prox-mid Cx. She received DES to mid AV groove Cx. EF 55-65%.   . Cervical stenosis of spine    With neck pain  . CKD (chronic kidney disease), stage II   . Colon polyps 2009  . Complication of anesthesia 1980s   slow to wake after anesthesia "when I had breast biopsy"  . GERD (gastroesophageal reflux disease)   . Hyperlipidemia    myalgias with Lipitor and Zetia  . Hypertension   . Migraine    "stopped at age 56" (12/24/2017)  . Squamous carcinoma    "iced off and cut off; mostly arms" (12/24/2017)  . Stroke [facility])    "told me I'd had 2 strokes in 02/2013"; denies residual on 12/24/2017  . TIA (transient ischemic attack)   . Type II diabetes mellitus ([place])    Past Surgical History:  Procedure Laterality Date  . ABD [place]  07/2003   Negative  . APPENDECTOMY    . BASAL CELL CARCINOMA EXCISION     "back"  . BREAST BIOPSY Left 1992   benign  . CARDIAC CATHETERIZATION  04/07/2011     non obst CAD (Dr Burt Knack)  . CATARACT EXTRACTION W/PHACO Left 04/01/2017   Procedure: CATARACT EXTRACTION PHACO AND INTRAOCULAR LENS PLACEMENT ([place]) LEFT DIABETIC;  Surgeon: Leandrew Koyanagi, MD;  Location: [place];  Service: Ophthalmology;  Laterality: Left;  Diabetic - oral meds  . CATARACT EXTRACTION W/PHACO Right 04/29/2017   Procedure: CATARACT EXTRACTION PHACO AND INTRAOCULAR LENS PLACEMENT ([place]) RIGHT DIABETIC;  Surgeon: Leandrew Koyanagi, MD;  Location: [place];  Service: Ophthalmology;  Laterality: Right;  Diabetic - oral meds  . COLONOSCOPY  12/2007   Adenomatous colon polyps  . CORONARY ANGIOPLASTY WITH STENT PLACEMENT  12/24/2017  . CORONARY STENT INTERVENTION N/A 12/24/2017   Procedure: CORONARY STENT INTERVENTION;  Surgeon: Lorretta Harp, MD;  Location: [place] CV LAB;  Service: Cardiovascular;  Laterality: N/A;  . CYSTOSCOPY W/ DECANNULATION  03/2000   Normal  . LEFT HEART CATH AND CORONARY ANGIOGRAPHY N/A 12/24/2017   Procedure: LEFT HEART CATH AND CORONARY ANGIOGRAPHY;  Surgeon: Lorretta Harp, MD;  Location: [place] CV LAB;  Service: Cardiovascular;  Laterality: N/A;  . LOOP RECORDER IMPLANT N/A 02/16/2013   Procedure: LOOP RECORDER IMPLANT;  Surgeon: Deboraha Sprang, MD;  Location: [facility] CATH LAB;  Service: Cardiovascular;  Laterality: N/A;  . NASAL SINUS SURGERY  01/2005  . SQUAMOUS CELL CARCINOMA EXCISION     "mostly arms;" (12/24/2017)  . STRESS CARDIOLITE  11/1999   Normal/ negative  . TEAR DUCT PROBING  2005   "? side"  . TEE WITHOUT CARDIOVERSION N/A 02/16/2013   Procedure: TRANSESOPHAGEAL ECHOCARDIOGRAM (TEE);  Surgeon: Josue Hector, MD;  Location: [facility] ENDOSCOPY;  Service: Cardiovascular;  Laterality: N/A;  . TUBAL LIGATION     BTL   Family History  Problem Relation Age of Onset  . Lung cancer Brother   . Diabetes Brother   . Pancreatic cancer Brother   . Heart disease Mother   . Heart disease Father   . Brain cancer  Other   . Skin cancer Daughter   . Diabetes Sister   . Colon cancer Neg Hx    Social History   Tobacco Use  . Smoking status: Never Smoker  . Smokeless tobacco: Never Used  Substance Use Topics  . Alcohol use: Never    Alcohol/week: 0.0 standard drinks    Frequency: Never  . Drug use: Never      Review of Systems Per HPI    Objective:   Physical Exam Vitals signs reviewed.  Constitutional:      General: She is not in acute distress.    Appearance: Normal appearance. She is obese. She is not ill-appearing or toxic-appearing.  HENT:     Head: Normocephalic and atraumatic.     Right Ear: Tympanic membrane, ear canal and external ear normal.     Left Ear: Tympanic membrane, ear canal and external ear normal.     Nose: Congestion present.     Mouth/Throat:     Mouth: Mucous membranes are moist.     Pharynx: Oropharynx is clear.  Eyes:     Conjunctiva/sclera:     Right eye: Right conjunctiva is injected.  Neck:     Musculoskeletal: Normal range of motion and neck supple. No neck rigidity.  Cardiovascular:     Rate and Rhythm: Normal rate and regular rhythm.     Heart sounds: Normal heart sounds.  Pulmonary:     Effort: Pulmonary effort is normal.     Breath sounds: Normal breath sounds.  Lymphadenopathy:     Cervical: No cervical adenopathy.  Skin:    General: Skin is warm and dry.  Neurological:     Mental Status: She is alert and oriented to person, place, and time.  Psychiatric:        Mood and Affect: Mood normal.        Behavior: Behavior normal.        Thought Content: Thought content normal.        Judgment: Judgment normal.       BP 120/64   Pulse 65   Temp 98.1 F (36.7 C) (Oral)   Ht '5\' 8"'$  (1.727 m)   Wt 191 lb 12 oz (87 kg)   SpO2 98%   BMI 29.16 kg/m  Wt Readings from Last 3 Encounters:  07/14/18 191 lb 12 oz (87 kg)  07/06/18 192 lb 12.8 oz (87.5 kg)  06/15/18 191 lb 6.4 oz (86.8 kg)       Assessment & Plan:  1. Viral URI with  cough - Provided written and verbal information regarding diagnosis and treatment. -  Patient Instructions  Good to see you today  I think you have a virus  Drink plenty of water  Use saline nasal spray 3-5  times a day for nasal congestion  You can take Tylenol 2 tablets as needed twice a day for fever/chills, pain  - RTC precautions reviewed   Clarene Reamer, FNP-BC  [facility]  07/14/2018 5:17 PM

## 2018-07-21 ENCOUNTER — Encounter: Payer: Self-pay | Admitting: *Deleted

## 2018-07-21 DIAGNOSIS — Z006 Encounter for examination for normal comparison and control in clinical research program: Secondary | ICD-10-CM

## 2018-07-21 NOTE — Research (Signed)
Subject to research clinic for screening visit  In the [facility] 4 research study.  Subject met inclusion and exclusion criteria.  The informed consent form, study requirements and expectations were reviewed with the subject and questions and concerns were addressed prior to the signing of the consent form.  The subject verbalized understanding of the trial requirements.  The subject agreed to participate in the [place] 4 trial and signed the informed consent.  The informed consent was obtained prior to performance of any protocol-specific procedures for the subject.  A copy of the signed informed consent was given to the subject and a copy was placed in the subject's medical record.  Finger stick cholesterol check was 181.  Started in run-in phase of the trial.  Next visit scheduled.

## 2018-08-02 ENCOUNTER — Ambulatory Visit (INDEPENDENT_AMBULATORY_CARE_PROVIDER_SITE_OTHER): Payer: HMO | Admitting: Primary Care

## 2018-08-02 VITALS — BP 140/84 | HR 65 | Temp 98.2°F | Ht 68.0 in | Wt 194.0 lb

## 2018-08-02 DIAGNOSIS — M79605 Pain in left leg: Secondary | ICD-10-CM | POA: Insufficient documentation

## 2018-08-02 MED ORDER — TIZANIDINE HCL 4 MG PO TABS: ORAL_TABLET | ORAL | 0 refills | Status: DC

## 2018-08-02 MED ORDER — PREDNISONE 20 MG PO TABS: ORAL_TABLET | ORAL | 0 refills | Status: DC

## 2018-08-02 NOTE — Progress Notes (Signed)
Subjective:    Patient ID: Margaret Vazquez, female    DOB: December 24, 1935, 83 y.o.   MRN: [id number]  HPI  Margaret Vazquez is an 83 year old female with a history of TIA, hypertension, CAD, Type 2 Diabetes, back pain who presents today with a chief complaint of lower extremity pain.  Her pain is located to the posterior upper portion of her lower extremity, distal to her buttocks. Her pain began two nights ago several hours after lifting wood from a tree that she and her husband trimmed. She describes her pain as sharp, shooting, dull pain with radiation to her posterior knee.   She's been taking Tylenol and taking warm baths without much improvement. No improvement with position changes. Cannot get comfortable. Pain with sitting in a chair and sitting in her recliner. A1C of 6.5 in December 2019, fasting blood sugars running 120-140 on average. She denies injury/trauma, changes in bowel/bladder patterns.   Review of Systems  Musculoskeletal:       Left lower extremity pain  Skin: Negative for color change.  Neurological: Negative for weakness and numbness.       Past Medical History:  Diagnosis Date  . Allergy history, drug    Aspirin  . Basal cell carcinoma    "back"  . CAD (coronary artery disease)    a. Previously nonobstructive then progressive angina with abnl CT -> cardiac cath 12/24/17 showed 30% mid RCA and 80% prox-mid Cx. She received DES to mid AV groove Cx. EF 55-65%.   . Cervical stenosis of spine    With neck pain  . CKD (chronic kidney disease), stage II   . Colon polyps 2009  . Complication of anesthesia 1980s   slow to wake after anesthesia "when I had breast biopsy"  . GERD (gastroesophageal reflux disease)   . Hyperlipidemia    myalgias with Lipitor and Zetia  . Hypertension   . Migraine    "stopped at age 80" (12/24/2017)  . Squamous carcinoma    "iced off and cut off; mostly arms" (12/24/2017)  . Stroke [facility])    "told me I'd had 2 strokes in 02/2013"; denies  residual on 12/24/2017  . TIA (transient ischemic attack)   . Type II diabetes mellitus ([place])      Social History   Socioeconomic History  . Marital status: Married    Spouse name: Eddie Dibbles  . Number of children: 3  . Years of education: 30  . Highest education level: Not on file  Occupational History  . Occupation: Retired    Fish farm manager: RETIRED  Social Needs  . Financial resource strain: Not on file  . Food insecurity:    Worry: Not on file    Inability: Not on file  . Transportation needs:    Medical: Not on file    Non-medical: Not on file  Tobacco Use  . Smoking status: Never Smoker  . Smokeless tobacco: Never Used  Substance and Sexual Activity  . Alcohol use: Never    Alcohol/week: 0.0 standard drinks    Frequency: Never  . Drug use: Never  . Sexual activity: Not Currently  Lifestyle  . Physical activity:    Days per week: Not on file    Minutes per session: Not on file  . Stress: Not on file  Relationships  . Social connections:    Talks on phone: Not on file    Gets together: Not on file    Attends religious service: Not on file  Active member of club or organization: Not on file    Attends meetings of clubs or organizations: Not on file    Relationship status: Not on file  . Intimate partner violence:    Fear of current or ex partner: Not on file    Emotionally abused: Not on file    Physically abused: Not on file    Forced sexual activity: Not on file  Other Topics Concern  . Not on file  Social History Narrative   3 children   Does not drink caffeinated beverages   Cares for SIL with dementia    Past Surgical History:  Procedure Laterality Date  . ABD [place]  07/2003   Negative  . APPENDECTOMY    . BASAL CELL CARCINOMA EXCISION     "back"  . BREAST BIOPSY Left 1992   benign  . CARDIAC CATHETERIZATION  04/07/2011   non obst CAD (Dr Burt Knack)  . CATARACT EXTRACTION W/PHACO Left 04/01/2017   Procedure: CATARACT EXTRACTION PHACO AND INTRAOCULAR LENS  PLACEMENT ([place]) LEFT DIABETIC;  Surgeon: Leandrew Koyanagi, MD;  Location: [place];  Service: Ophthalmology;  Laterality: Left;  Diabetic - oral meds  . CATARACT EXTRACTION W/PHACO Right 04/29/2017   Procedure: CATARACT EXTRACTION PHACO AND INTRAOCULAR LENS PLACEMENT ([place]) RIGHT DIABETIC;  Surgeon: Leandrew Koyanagi, MD;  Location: [place];  Service: Ophthalmology;  Laterality: Right;  Diabetic - oral meds  . COLONOSCOPY  12/2007   Adenomatous colon polyps  . CORONARY ANGIOPLASTY WITH STENT PLACEMENT  12/24/2017  . CORONARY STENT INTERVENTION N/A 12/24/2017   Procedure: CORONARY STENT INTERVENTION;  Surgeon: Lorretta Harp, MD;  Location: [place] CV LAB;  Service: Cardiovascular;  Laterality: N/A;  . CYSTOSCOPY W/ DECANNULATION  03/2000   Normal  . LEFT HEART CATH AND CORONARY ANGIOGRAPHY N/A 12/24/2017   Procedure: LEFT HEART CATH AND CORONARY ANGIOGRAPHY;  Surgeon: Lorretta Harp, MD;  Location: [place] CV LAB;  Service: Cardiovascular;  Laterality: N/A;  . LOOP RECORDER IMPLANT N/A 02/16/2013   Procedure: LOOP RECORDER IMPLANT;  Surgeon: Deboraha Sprang, MD;  Location: [facility] CATH LAB;  Service: Cardiovascular;  Laterality: N/A;  . NASAL SINUS SURGERY  01/2005  . SQUAMOUS CELL CARCINOMA EXCISION     "mostly arms;" (12/24/2017)  . STRESS CARDIOLITE  11/1999   Normal/ negative  . TEAR DUCT PROBING  2005   "? side"  . TEE WITHOUT CARDIOVERSION N/A 02/16/2013   Procedure: TRANSESOPHAGEAL ECHOCARDIOGRAM (TEE);  Surgeon: Josue Hector, MD;  Location: [facility] ENDOSCOPY;  Service: Cardiovascular;  Laterality: N/A;  . TUBAL LIGATION     BTL    Family History  Problem Relation Age of Onset  . Lung cancer Brother   . Diabetes Brother   . Pancreatic cancer Brother   . Heart disease Mother   . Heart disease Father   . Brain cancer Other   . Skin cancer Daughter   . Diabetes Sister   . Colon cancer Neg Hx     Allergies  Allergen Reactions  . Bee Venom  Hives, Shortness Of Breath and Swelling  . Nabumetone Anaphylaxis  . Amoxicillin-Pot Clavulanate Hives and Swelling    To lips.  . Aspirin Hives  . Atorvastatin Swelling     joint pain/swelling, inc liver tests  . Clopidogrel Bisulfate Hives  . Codeine Nausea And Vomiting  . Ezetimibe Other (See Comments)     fatigue  . Metformin And Related Other (See Comments)    Diarrhea   .  Valsartan Other (See Comments)     fatigue  . Other Hives    **Red Meat**  SOB    Current Outpatient Medications on File Prior to Visit  Medication Sig Dispense Refill  . diphenhydrAMINE (BENADRYL) 25 MG tablet Take 50 mg by mouth every 8 (eight) hours as needed for allergies.     Marland Kitchen EPINEPHrine (EPI-PEN) 0.3 mg/0.3 mL DEVI Inject 0.3 mg into the muscle daily as needed (allergic reaction).     . fluticasone (FLONASE) 50 MCG/ACT nasal spray Place 2 sprays into both nostrils 2 (two) times daily as needed. Reported on 09/10/2015    . glipiZIDE (GLUCOTROL XL) 10 MG 24 hr tablet Take 1 tablet (10 mg total) by mouth daily with breakfast. 90 tablet 3  . losartan-hydrochlorothiazide (HYZAAR) 50-12.5 MG tablet Take 1 tablet by mouth daily.     . metFORMIN (GLUCOPHAGE-XR) 500 MG 24 hr tablet Take 1 tablet (500 mg total) by mouth daily with breakfast. 30 tablet 11  . nitroGLYCERIN (NITROSTAT) 0.4 MG SL tablet Place 1 tablet (0.4 mg total) under the tongue every 5 (five) minutes as needed for chest pain (up to 3 doses. If taking 3rd dose call 911). 25 tablet 3  . potassium chloride SA (K-DUR,KLOR-CON) 20 MEQ tablet Take 1 tablet (20 mEq total) by mouth daily. 90 tablet 3  . prednisoLONE acetate (PRED FORTE) 1 % ophthalmic suspension SHAKE LQ AND INT 1 GTT IN OU BID FOR 10 DAYS  0  . ticagrelor (BRILINTA) 90 MG TABS tablet Take 1 tablet (90 mg total) by mouth 2 (two) times daily. 60 tablet 11   No current facility-administered medications on file prior to visit.     BP 140/84   Pulse 65   Temp 98.2 F (36.8 C) (Oral)    Ht '5\' 8"'$  (1.727 m)   Wt 194 lb (88 kg)   SpO2 98%   BMI 29.50 kg/m    Objective:   Physical Exam  Constitutional: She appears well-nourished.  Musculoskeletal:     Left upper leg: She exhibits no tenderness and no swelling.       Legs:     Comments: Appears uncomfortable during exam, cannot sit on exam table for long. 5/5 strength to bilateral lower extremities.   Skin: Skin is warm and dry. No erythema.           Assessment & Plan:

## 2018-08-02 NOTE — Assessment & Plan Note (Signed)
Suspect sciatic nerve involvement given location of symptoms and presentation. Diabetes is under great control with last A1C of 6.5 in December 2019. Rx for short term prednisone course and Tizanidine HS sent to pharmacy. Cannot take NSAID's as she's on Brilinta.  Discussed heat/ice, Tylenol , stretching.  Follow up with PCP if no improvement.

## 2018-08-02 NOTE — Patient Instructions (Signed)
Start prednisone 20 mg tablets for leg pain. Take 2 tablets for 3 days then 1 tablet for 3 days.  You can take the tizanidine at bedtime as needed for muscle spasm/sleep.  Apply a heating pad to the site as needed. Make sure to get up and stretch to prevent further cramping/pain.  It was a pleasure meeting you!

## 2018-08-06 ENCOUNTER — Telehealth: Payer: Self-pay | Admitting: Primary Care

## 2018-08-06 NOTE — Telephone Encounter (Signed)
Would not recommend another round of prednisone.  She can use the muscle relaxer Tizanidine as prescribed, may cause drowsiness. Also use Tylenol arthritis [phone] mg every 8 hours. Have her see Dr. Glori Bickers if no improvement.

## 2018-08-06 NOTE — Telephone Encounter (Signed)
Pt stated Margaret Vazquez said if she isn't feeling any better to call back and let her know. Please advise pt.

## 2018-08-06 NOTE — Telephone Encounter (Signed)
Spoken to patient she stated that she still have some pain. The prednisone is helping and it ease the pain. Patient is concern since she still having pain if she need another round of prednisone. Please advise.

## 2018-08-06 NOTE — Telephone Encounter (Signed)
Spoken and notified patient of Margaret Vazquez's comments. Patient verbalized understanding.  

## 2018-08-07 ENCOUNTER — Ambulatory Visit (HOSPITAL_COMMUNITY)
Admission: EM | Admit: 2018-08-07 | Discharge: 2018-08-07 | Disposition: A | Discharge status: Home / Self Care | Payer: HMO | Attending: Family Medicine | Admitting: Family Medicine

## 2018-08-07 ENCOUNTER — Encounter (HOSPITAL_COMMUNITY): Payer: Self-pay | Admitting: Emergency Medicine

## 2018-08-07 DIAGNOSIS — B029 Zoster without complications: Secondary | ICD-10-CM

## 2018-08-07 MED ORDER — TRAMADOL HCL 50 MG PO TABS: 50.0000 mg | ORAL_TABLET | Freq: Four times a day (QID) | ORAL | 0 refills | Status: DC | PRN

## 2018-08-07 MED ORDER — VALACYCLOVIR HCL 1 G PO TABS: 1000.0000 mg | ORAL_TABLET | Freq: Three times a day (TID) | ORAL | 0 refills | Status: DC

## 2018-08-07 NOTE — ED Provider Notes (Signed)
[place]    CSN: [id number] Arrival date & time: 08/07/18  1014     History   Chief Complaint Chief Complaint  Patient presents with  . Rash    HPI Margaret Vazquez is a 83 y.o. female.   Patient presents with a rash.  Started out with pain in her left buttock.  She was treated with prednisone for what was called sciatica but then she broke out in a rash and treatment did not change.  HPI  Past Medical History:  Diagnosis Date  . Allergy history, drug    Aspirin  . Basal cell carcinoma    "back"  . CAD (coronary artery disease)    a. Previously nonobstructive then progressive angina with abnl CT -> cardiac cath 12/24/17 showed 30% mid RCA and 80% prox-mid Cx. She received DES to mid AV groove Cx. EF 55-65%.   . Cervical stenosis of spine    With neck pain  . CKD (chronic kidney disease), stage II   . Colon polyps 2009  . Complication of anesthesia 1980s   slow to wake after anesthesia "when I had breast biopsy"  . GERD (gastroesophageal reflux disease)   . Hyperlipidemia    myalgias with Lipitor and Zetia  . Hypertension   . Migraine    "stopped at age 59" (12/24/2017)  . Squamous carcinoma    "iced off and cut off; mostly arms" (12/24/2017)  . Stroke [facility])    "told me I'd had 2 strokes in 02/2013"; denies residual on 12/24/2017  . TIA (transient ischemic attack)   . Type II diabetes mellitus [facility])     Patient Active Problem List   Diagnosis Date Noted  . Acute pain of left lower extremity 08/02/2018  . Plantar wart, left foot 06/15/2018  . Progressive angina ([place]) 12/25/2017  . CKD (chronic kidney disease), stage II 12/25/2017  . CAD (coronary artery disease) 12/24/2017  . Routine general medical examination at a health care facility 08/24/2016  . Paroxysmal atrial fibrillation ([place]) 04/07/2016  . Fall 08/13/2015  . Obesity (BMI 30-39.9) 08/23/2014  . Type 2 diabetes mellitus with diabetic peripheral angiopathy without gangrene, without long-term  current use of insulin ([place]) 01/12/2014  . TIA (transient ischemic attack) 01/11/2014  . CVA (cerebral infarction) 01/11/2014  . LOOP Recorder LINQ 06/07/2013  . Fatigue 06/07/2013  . Sinusitis, chronic 05/16/2013  . H/O: CVA (cerebrovascular accident) 02/15/2013  . Encounter for Medicare annual wellness exam 10/27/2012  . Encopresis(307.7) 12/15/2011  . Post-menopausal 10/28/2011  . PERSONAL HX COLONIC POLYPS 09/04/2009  . B12 deficiency 03/06/2009  . ALLERGIC RHINITIS 02/14/2008  . BACK PAIN, LUMBAR 11/03/2007  . Hyperlipidemia associated with type 2 diabetes mellitus ([place]) 11/24/2006  . Essential hypertension 11/24/2006  . GERD 10/09/2006  . OVERACTIVE BLADDER 10/09/2006  . INCONTINENCE, URGE 10/09/2006  . SKIN CANCER, HX OF 10/09/2006    Past Surgical History:  Procedure Laterality Date  . ABD [place]  07/2003   Negative  . APPENDECTOMY    . BASAL CELL CARCINOMA EXCISION     "back"  . BREAST BIOPSY Left 1992   benign  . CARDIAC CATHETERIZATION  04/07/2011   non obst CAD (Dr Burt Knack)  . CATARACT EXTRACTION W/PHACO Left 04/01/2017   Procedure: CATARACT EXTRACTION PHACO AND INTRAOCULAR LENS PLACEMENT ([place]) LEFT DIABETIC;  Surgeon: Leandrew Koyanagi, MD;  Location: [place];  Service: Ophthalmology;  Laterality: Left;  Diabetic - oral meds  . CATARACT EXTRACTION W/PHACO Right 04/29/2017   Procedure: CATARACT  EXTRACTION PHACO AND INTRAOCULAR LENS PLACEMENT ([place]) RIGHT DIABETIC;  Surgeon: Leandrew Koyanagi, MD;  Location: [place];  Service: Ophthalmology;  Laterality: Right;  Diabetic - oral meds  . COLONOSCOPY  12/2007   Adenomatous colon polyps  . CORONARY ANGIOPLASTY WITH STENT PLACEMENT  12/24/2017  . CORONARY STENT INTERVENTION N/A 12/24/2017   Procedure: CORONARY STENT INTERVENTION;  Surgeon: Lorretta Harp, MD;  Location: [place] CV LAB;  Service: Cardiovascular;  Laterality: N/A;  . CYSTOSCOPY W/ DECANNULATION  03/2000   Normal  . LEFT  HEART CATH AND CORONARY ANGIOGRAPHY N/A 12/24/2017   Procedure: LEFT HEART CATH AND CORONARY ANGIOGRAPHY;  Surgeon: Lorretta Harp, MD;  Location: [place] CV LAB;  Service: Cardiovascular;  Laterality: N/A;  . LOOP RECORDER IMPLANT N/A 02/16/2013   Procedure: LOOP RECORDER IMPLANT;  Surgeon: Deboraha Sprang, MD;  Location: [facility] CATH LAB;  Service: Cardiovascular;  Laterality: N/A;  . NASAL SINUS SURGERY  01/2005  . SQUAMOUS CELL CARCINOMA EXCISION     "mostly arms;" (12/24/2017)  . STRESS CARDIOLITE  11/1999   Normal/ negative  . TEAR DUCT PROBING  2005   "? side"  . TEE WITHOUT CARDIOVERSION N/A 02/16/2013   Procedure: TRANSESOPHAGEAL ECHOCARDIOGRAM (TEE);  Surgeon: Josue Hector, MD;  Location: [facility] ENDOSCOPY;  Service: Cardiovascular;  Laterality: N/A;  . TUBAL LIGATION     BTL    OB History   No obstetric history on file.      Home Medications    Prior to Admission medications   Medication Sig Start Date End Date Taking? Authorizing Provider  diphenhydrAMINE (BENADRYL) 25 MG tablet Take 50 mg by mouth every 8 (eight) hours as needed for allergies.     [provider]  EPINEPHrine (EPI-PEN) 0.3 mg/0.3 mL DEVI Inject 0.3 mg into the muscle daily as needed (allergic reaction).  12/20/10   Tower, [name], MD  erythromycin ophthalmic ointment  07/12/18   [provider]  fluticasone (FLONASE) 50 MCG/ACT nasal spray Place 2 sprays into both nostrils 2 (two) times daily as needed. Reported on 09/10/2015 01/20/14   [provider]  glipiZIDE (GLUCOTROL XL) 10 MG 24 hr tablet Take 1 tablet (10 mg total) by mouth daily with breakfast. 09/08/17   Tower, [name], MD  losartan-hydrochlorothiazide (HYZAAR) 50-12.5 MG tablet Take 1 tablet by mouth daily.     [provider]  metFORMIN (GLUCOPHAGE-XR) 500 MG 24 hr tablet Take 1 tablet (500 mg total) by mouth daily with breakfast. 10/15/17   Tower, [name], MD  nitroGLYCERIN (NITROSTAT) 0.4 MG SL tablet Place 1 tablet  (0.4 mg total) under the tongue every 5 (five) minutes as needed for chest pain (up to 3 doses. If taking 3rd dose call 911). 12/25/17   Dunn, Nedra Hai, PA-C  potassium chloride SA (K-DUR,KLOR-CON) 20 MEQ tablet Take 1 tablet (20 mEq total) by mouth daily. 09/08/17   Tower, [name], MD  prednisoLONE acetate (PRED FORTE) 1 % ophthalmic suspension SHAKE LQ AND INT 1 GTT IN OU BID FOR 10 DAYS 05/28/18   [provider]  predniSONE (DELTASONE) 20 MG tablet Take 2 tablets for 3 days then 1 tablet for 3 days. 08/02/18   Pleas Koch, NP  ticagrelor (BRILINTA) 90 MG TABS tablet Take 1 tablet (90 mg total) by mouth 2 (two) times daily. 12/25/17   Dunn, Nedra Hai, PA-C  tiZANidine (ZANAFLEX) 4 MG tablet Take 1 tablet by mouth at bedtime as needed for muscle spasm/sleep.  08/02/18   Pleas Koch, NP    Family History Family History  Problem Relation Age of Onset  . Lung cancer Brother   . Diabetes Brother   . Pancreatic cancer Brother   . Heart disease Mother   . Heart disease Father   . Brain cancer Other   . Skin cancer Daughter   . Diabetes Sister   . Colon cancer Neg Hx     Social History Social History   Tobacco Use  . Smoking status: Never Smoker  . Smokeless tobacco: Never Used  Substance Use Topics  . Alcohol use: Never    Alcohol/week: 0.0 standard drinks    Frequency: Never  . Drug use: Never     Allergies   Bee venom; Nabumetone; Amoxicillin-pot clavulanate; Aspirin; Atorvastatin; Clopidogrel bisulfate; Codeine; Ezetimibe; Metformin and related; Valsartan; and Other   Review of Systems Review of Systems  Musculoskeletal: Negative.        Pain in buttock and perineum  Skin: Positive for rash.     Physical Exam Triage Vital Signs ED Triage Vitals  Enc Vitals Group     BP 08/07/18 1056 (!) 141/67     Pulse Rate 08/07/18 1056 65     Resp 08/07/18 1056 18     Temp 08/07/18 1056 (!) 97.2 F (36.2 C)     Temp src --      SpO2 08/07/18 1056 100 %      Weight --      Height --      Head Circumference --      Peak Flow --      Pain Score 08/07/18 1057 10     Pain Loc --      Pain Edu? --      Excl. in [place]? --    No data found.  Updated Vital Signs BP (!) 141/67   Pulse 65   Temp (!) 97.2 F (36.2 C)   Resp 18   SpO2 100%   Visual Acuity Right Eye Distance:   Left Eye Distance:   Bilateral Distance:    Right Eye Near:   Left Eye Near:    Bilateral Near:     Physical Exam Vitals signs and nursing note reviewed. Exam conducted with a chaperone present.  Constitutional:      Appearance: Normal appearance.  Skin:    Comments: Rash on left buttock thigh and perineum consistent with zoster  Neurological:     Mental Status: She is alert.      UC Treatments / Results  Labs (all labs ordered are listed, but only abnormal results are displayed) Labs Reviewed - No data to display  EKG None  Radiology No results found.  Procedures Procedures (including critical care time)  Medications Ordered in UC Medications - No data to display  Initial Impression / Assessment and Plan / UC Course  I have reviewed the triage vital signs and the nursing notes.  Pertinent labs & imaging results that were available during my care of the patient were reviewed by me and considered in my medical decision making (see chart for details).     Herpes zoster.  Will treat with  Valacyclovir and tramadol Final Clinical Impressions(s) / UC Diagnoses   Final diagnoses:  None   Discharge Instructions   None    ED Prescriptions    None     Controlled Substance Prescriptions [place] Controlled Substance Registry consulted? Yes, I have consulted the [place] Controlled Substances Registry for  this patient, and feel the risk/benefit ratio today is favorable for proceeding with this prescription for a controlled substance.   Wardell Honour, MD 08/07/18 [phone]

## 2018-08-07 NOTE — ED Triage Notes (Signed)
Pt c/o stinging pain in her L buttocks, states she noticed a rash there as well. Pain x1 week.

## 2018-08-10 ENCOUNTER — Encounter: Payer: Self-pay | Admitting: Family Medicine

## 2018-08-10 ENCOUNTER — Ambulatory Visit (INDEPENDENT_AMBULATORY_CARE_PROVIDER_SITE_OTHER): Payer: HMO | Admitting: Family Medicine

## 2018-08-10 VITALS — BP 110/58 | HR 86 | Temp 98.6°F | Ht 68.0 in

## 2018-08-10 DIAGNOSIS — N390 Urinary tract infection, site not specified: Secondary | ICD-10-CM | POA: Insufficient documentation

## 2018-08-10 DIAGNOSIS — B029 Zoster without complications: Secondary | ICD-10-CM | POA: Insufficient documentation

## 2018-08-10 DIAGNOSIS — R350 Frequency of micturition: Secondary | ICD-10-CM

## 2018-08-10 DIAGNOSIS — M79605 Pain in left leg: Secondary | ICD-10-CM

## 2018-08-10 DIAGNOSIS — N3000 Acute cystitis without hematuria: Secondary | ICD-10-CM

## 2018-08-10 LAB — POC URINALSYSI DIPSTICK (AUTOMATED)
Bilirubin, UA: 1
Blood, UA: NEGATIVE
Glucose, UA: NEGATIVE
Ketones, UA: 5
Nitrite, UA: NEGATIVE
Protein, UA: POSITIVE — AB
Spec Grav, UA: >=1.03 — AB (ref 1.010–1.025)
Urobilinogen, UA: 1 E.U./dL
pH, UA: 5 (ref 5.0–8.0)

## 2018-08-10 MED ORDER — SULFAMETHOXAZOLE-TRIMETHOPRIM 800-160 MG PO TABS: 1.0000 | ORAL_TABLET | Freq: Two times a day (BID) | ORAL | 0 refills | Status: DC

## 2018-08-10 NOTE — Assessment & Plan Note (Signed)
Hesitant to use keflex due to allergy to pcn Will tx with 5 d of bactrim DS Enc fluids cx pending

## 2018-08-10 NOTE — Patient Instructions (Addendum)
For the uti - drink lots of fluids and take the bactrim as directed   The pain you are having (even the deep pain) is from shingles  Take the tramadol as needed / you can take tylenol with it safely   When you are almost out of tramadol - let us know when you need it refilled (don't wait for the weekend)  Pain control is the goal for now   Tramadol may cause sedation and some constipation  Get a stool softener and /or miralax if needed for constipation   Take care of yourself   Keep me posted   Use cool water on rash to keep it clean  Cold compress may feel good also

## 2018-08-10 NOTE — Progress Notes (Signed)
Subjective:    Patient ID: Margaret Vazquez, female    DOB: 09-08-1935, 83 y.o.   MRN: [id number]  HPI Here for f/u of shingles  Also urinary symptoms   Diagnosed with zoster in ED on 2/1 Had prev had pain (thought it may be sciatica)  Then developed a ras on L buttock and thigh and perineum   She was px valtrex Tramadol for pain   Was prev on prednisone -? If made it worse   She is miserable in pain  Very sensitive to the touch Extends to the labia   She is taking tylenol and tramadol   Has never had shingles vaccine   Urinary symptoms  Frequency and urgency  Runs to the bathroom and then not much volume Discomfort to urinate  No blood in urine  No fever    UA today  Results for orders placed or performed in visit on 08/10/18  POCT Urinalysis Dipstick (Automated)  Result Value Ref Range   Color, UA Amber    Clarity, UA Cloudy    Glucose, UA Negative Negative   Bilirubin, UA 1 mg/dL    Ketones, UA 5 mg/dL    Spec Grav, UA >=1.030 (A) 1.010 - 1.025   Blood, UA Negative    pH, UA 5.0 5.0 - 8.0   Protein, UA Positive (A) Negative   Urobilinogen, UA 1.0 0.2 or 1.0 E.U./dL   Nitrite, UA Negative    Leukocytes, UA Large (3+) (A) Negative     Patient Active Problem List   Diagnosis Date Noted  . UTI (urinary tract infection) 08/10/2018  . Herpes zoster 08/10/2018  . Acute pain of left lower extremity 08/02/2018  . Plantar wart, left foot 06/15/2018  . Progressive angina ([place]) 12/25/2017  . CKD (chronic kidney disease), stage II 12/25/2017  . CAD (coronary artery disease) 12/24/2017  . Routine general medical examination at a health care facility 08/24/2016  . Paroxysmal atrial fibrillation ([place]) 04/07/2016  . Fall 08/13/2015  . Obesity (BMI 30-39.9) 08/23/2014  . Type 2 diabetes mellitus with diabetic peripheral angiopathy without gangrene, without long-term current use of insulin ([place]) 01/12/2014  . TIA (transient ischemic attack) 01/11/2014  . CVA  (cerebral infarction) 01/11/2014  . LOOP Recorder LINQ 06/07/2013  . Fatigue 06/07/2013  . Sinusitis, chronic 05/16/2013  . H/O: CVA (cerebrovascular accident) 02/15/2013  . Encounter for Medicare annual wellness exam 10/27/2012  . Encopresis(307.7) 12/15/2011  . Post-menopausal 10/28/2011  . PERSONAL HX COLONIC POLYPS 09/04/2009  . B12 deficiency 03/06/2009  . ALLERGIC RHINITIS 02/14/2008  . BACK PAIN, LUMBAR 11/03/2007  . Hyperlipidemia associated with type 2 diabetes mellitus ([place]) 11/24/2006  . Essential hypertension 11/24/2006  . GERD 10/09/2006  . OVERACTIVE BLADDER 10/09/2006  . INCONTINENCE, URGE 10/09/2006  . SKIN CANCER, HX OF 10/09/2006   Past Medical History:  Diagnosis Date  . Allergy history, drug    Aspirin  . Basal cell carcinoma    "back"  . CAD (coronary artery disease)    a. Previously nonobstructive then progressive angina with abnl CT -> cardiac cath 12/24/17 showed 30% mid RCA and 80% prox-mid Cx. She received DES to mid AV groove Cx. EF 55-65%.   . Cervical stenosis of spine    With neck pain  . CKD (chronic kidney disease), stage II   . Colon polyps 2009  . Complication of anesthesia 1980s   slow to wake after anesthesia "when I had breast biopsy"  . GERD (gastroesophageal reflux disease)   .  Hyperlipidemia    myalgias with Lipitor and Zetia  . Hypertension   . Migraine    "stopped at age 80" (12/24/2017)  . Squamous carcinoma    "iced off and cut off; mostly arms" (12/24/2017)  . Stroke [facility])    "told me I'd had 2 strokes in 02/2013"; denies residual on 12/24/2017  . TIA (transient ischemic attack)   . Type II diabetes mellitus ([place])    Past Surgical History:  Procedure Laterality Date  . ABD [place]  07/2003   Negative  . APPENDECTOMY    . BASAL CELL CARCINOMA EXCISION     "back"  . BREAST BIOPSY Left 1992   benign  . CARDIAC CATHETERIZATION  04/07/2011   non obst CAD (Dr Burt Knack)  . CATARACT EXTRACTION W/PHACO Left 04/01/2017   Procedure:  CATARACT EXTRACTION PHACO AND INTRAOCULAR LENS PLACEMENT ([place]) LEFT DIABETIC;  Surgeon: Leandrew Koyanagi, MD;  Location: [place];  Service: Ophthalmology;  Laterality: Left;  Diabetic - oral meds  . CATARACT EXTRACTION W/PHACO Right 04/29/2017   Procedure: CATARACT EXTRACTION PHACO AND INTRAOCULAR LENS PLACEMENT ([place]) RIGHT DIABETIC;  Surgeon: Leandrew Koyanagi, MD;  Location: [place];  Service: Ophthalmology;  Laterality: Right;  Diabetic - oral meds  . COLONOSCOPY  12/2007   Adenomatous colon polyps  . CORONARY ANGIOPLASTY WITH STENT PLACEMENT  12/24/2017  . CORONARY STENT INTERVENTION N/A 12/24/2017   Procedure: CORONARY STENT INTERVENTION;  Surgeon: Lorretta Harp, MD;  Location: [place] CV LAB;  Service: Cardiovascular;  Laterality: N/A;  . CYSTOSCOPY W/ DECANNULATION  03/2000   Normal  . LEFT HEART CATH AND CORONARY ANGIOGRAPHY N/A 12/24/2017   Procedure: LEFT HEART CATH AND CORONARY ANGIOGRAPHY;  Surgeon: Lorretta Harp, MD;  Location: [place] CV LAB;  Service: Cardiovascular;  Laterality: N/A;  . LOOP RECORDER IMPLANT N/A 02/16/2013   Procedure: LOOP RECORDER IMPLANT;  Surgeon: Deboraha Sprang, MD;  Location: [facility] CATH LAB;  Service: Cardiovascular;  Laterality: N/A;  . NASAL SINUS SURGERY  01/2005  . SQUAMOUS CELL CARCINOMA EXCISION     "mostly arms;" (12/24/2017)  . STRESS CARDIOLITE  11/1999   Normal/ negative  . TEAR DUCT PROBING  2005   "? side"  . TEE WITHOUT CARDIOVERSION N/A 02/16/2013   Procedure: TRANSESOPHAGEAL ECHOCARDIOGRAM (TEE);  Surgeon: Josue Hector, MD;  Location[facility] ENDOSCOPY;  Service: Cardiovascular;  Laterality: N/A;  . TUBAL LIGATION     BTL   Social History   Tobacco Use  . Smoking status: Never Smoker  . Smokeless tobacco: Never Used  Substance Use Topics  . Alcohol use: Never    Alcohol/week: 0.0 standard drinks    Frequency: Never  . Drug use: Never   Family History  Problem Relation Age of Onset  . Lung  cancer Brother   . Diabetes Brother   . Pancreatic cancer Brother   . Heart disease Mother   . Heart disease Father   . Brain cancer Other   . Skin cancer Daughter   . Diabetes Sister   . Colon cancer Neg Hx    Allergies  Allergen Reactions  . Bee Venom Hives, Shortness Of Breath and Swelling  . Nabumetone Anaphylaxis  . Amoxicillin-Pot Clavulanate Hives and Swelling    To lips.  . Aspirin Hives  . Atorvastatin Swelling     joint pain/swelling, inc liver tests  . Clopidogrel Bisulfate Hives  . Codeine Nausea And Vomiting  . Ezetimibe Other (See Comments)     fatigue  .  Metformin And Related Other (See Comments)    Diarrhea   . Valsartan Other (See Comments)     fatigue  . Other Hives    **Red Meat**  SOB   Current Outpatient Medications on File Prior to Visit  Medication Sig Dispense Refill  . diphenhydrAMINE (BENADRYL) 25 MG tablet Take 50 mg by mouth every 8 (eight) hours as needed for allergies.     Marland Kitchen EPINEPHrine (EPI-PEN) 0.3 mg/0.3 mL DEVI Inject 0.3 mg into the muscle daily as needed (allergic reaction).     Marland Kitchen erythromycin ophthalmic ointment     . fluticasone (FLONASE) 50 MCG/ACT nasal spray Place 2 sprays into both nostrils 2 (two) times daily as needed. Reported on 09/10/2015    . glipiZIDE (GLUCOTROL XL) 10 MG 24 hr tablet Take 1 tablet (10 mg total) by mouth daily with breakfast. 90 tablet 3  . losartan-hydrochlorothiazide (HYZAAR) 50-12.5 MG tablet Take 1 tablet by mouth daily.     . metFORMIN (GLUCOPHAGE-XR) 500 MG 24 hr tablet Take 1 tablet (500 mg total) by mouth daily with breakfast. 30 tablet 11  . nitroGLYCERIN (NITROSTAT) 0.4 MG SL tablet Place 1 tablet (0.4 mg total) under the tongue every 5 (five) minutes as needed for chest pain (up to 3 doses. If taking 3rd dose call 911). 25 tablet 3  . potassium chloride SA (K-DUR,KLOR-CON) 20 MEQ tablet Take 1 tablet (20 mEq total) by mouth daily. 90 tablet 3  . prednisoLONE acetate (PRED FORTE) 1 % ophthalmic  suspension SHAKE LQ AND INT 1 GTT IN OU BID FOR 10 DAYS  0  . ticagrelor (BRILINTA) 90 MG TABS tablet Take 1 tablet (90 mg total) by mouth 2 (two) times daily. 60 tablet 11  . tiZANidine (ZANAFLEX) 4 MG tablet Take 1 tablet by mouth at bedtime as needed for muscle spasm/sleep. 14 tablet 0  . traMADol (ULTRAM) 50 MG tablet Take 1 tablet (50 mg total) by mouth every 6 (six) hours as needed. 15 tablet 0  . valACYclovir (VALTREX) 1000 MG tablet Take 1 tablet (1,000 mg total) by mouth 3 (three) times daily. 21 tablet 0   No current facility-administered medications on file prior to visit.     Review of Systems  Constitutional: Positive for fatigue. Negative for activity change, appetite change, fever and unexpected weight change.  HENT: Negative for congestion, ear pain, rhinorrhea, sinus pressure and sore throat.   Eyes: Negative for pain, redness and visual disturbance.  Respiratory: Negative for cough, shortness of breath and wheezing.   Cardiovascular: Negative for chest pain and palpitations.  Gastrointestinal: Negative for abdominal pain, blood in stool, constipation and diarrhea.  Endocrine: Negative for polydipsia and polyuria.  Genitourinary: Positive for dysuria, frequency and urgency. Negative for hematuria and pelvic pain.  Musculoskeletal: Positive for back pain. Negative for arthralgias and myalgias.       Back and leg /groin pain from zoster   Skin: Positive for rash. Negative for pallor.  Allergic/Immunologic: Negative for environmental allergies.  Neurological: Negative for dizziness, syncope and headaches.  Hematological: Negative for adenopathy. Does not bruise/bleed easily.  Psychiatric/Behavioral: Negative for decreased concentration and dysphoric mood. The patient is not nervous/anxious.        Objective:   Physical Exam Constitutional:      General: She is not in acute distress.    Appearance: Normal appearance. She is obese.     Comments: Uncomfortable appearing     HENT:     Head: Normocephalic and atraumatic.  Mouth/Throat:     Mouth: Mucous membranes are moist.     Pharynx: Oropharynx is clear.  Eyes:     Extraocular Movements: Extraocular movements intact.     Conjunctiva/sclera: Conjunctivae normal.     Pupils: Pupils are equal, round, and reactive to light.  Cardiovascular:     Rate and Rhythm: Normal rate.     Pulses: Normal pulses.     Heart sounds: Normal heart sounds.  Pulmonary:     Effort: Pulmonary effort is normal. No respiratory distress.     Breath sounds: Normal breath sounds. No wheezing.  Abdominal:     General: Abdomen is flat. Bowel sounds are normal. There is no distension.     Palpations: Abdomen is soft. There is no mass.     Comments: Mild suprapubic tenderness w/o rebound  No cva tenderness  Musculoskeletal:     Right lower leg: No edema.     Left lower leg: No edema.  Lymphadenopathy:     Cervical: No cervical adenopathy.  Skin:    General: Skin is warm and dry.     Findings: Rash present.     Comments: Patchy vesicular rash in L1, L2 dermatomes of LLE and buttock area including labia majora Tender without swelling Some areas on buttocks are drying up  Pain correlates with these areas   Neurological:     Mental Status: She is alert. Mental status is at baseline.     Gait: Gait normal.     Deep Tendon Reflexes: Reflexes normal.  Psychiatric:        Mood and Affect: Mood normal.           Assessment & Plan:   Problem List Items Addressed This Visit      Genitourinary   UTI (urinary tract infection) - Primary    Hesitant to use keflex due to allergy to pcn Will tx with 5 d of bactrim DS Enc fluids cx pending       Relevant Medications   sulfamethoxazole-trimethoprim (BACTRIM DS,SEPTRA DS) 800-160 MG tablet   Other Relevant Orders   Urine Culture     Other   Acute pain of left lower extremity    Rash later broke out- cause is zoster       Herpes zoster    In L1, L2 dermatome on L -  with vesicular rash -some spots on buttocks starting to dry  Includes L labia majora Adv to continue and finish valtrex Tramadol for pain (stressed imp of calling for refill when needed)  Can use tylenol with that as well  Cool compress Consider gabapentin depending on progress- will update Handout given       Relevant Medications   sulfamethoxazole-trimethoprim (BACTRIM DS,SEPTRA DS) 800-160 MG tablet    Other Visit Diagnoses    Urinary frequency       Relevant Orders   POCT Urinalysis Dipstick (Automated) (Completed)

## 2018-08-10 NOTE — Assessment & Plan Note (Signed)
Rash later broke out- cause is zoster

## 2018-08-10 NOTE — Assessment & Plan Note (Signed)
In L1, L2 dermatome on L - with vesicular rash -some spots on buttocks starting to dry  Includes L labia majora Adv to continue and finish valtrex Tramadol for pain (stressed imp of calling for refill when needed)  Can use tylenol with that as well  Cool compress Consider gabapentin depending on progress- will update Handout given

## 2018-08-12 ENCOUNTER — Other Ambulatory Visit: Payer: Self-pay | Admitting: Family Medicine

## 2018-08-12 MED ORDER — TRAMADOL HCL 50 MG PO TABS: 50.0000 mg | ORAL_TABLET | Freq: Four times a day (QID) | ORAL | 0 refills | Status: DC | PRN

## 2018-08-12 NOTE — Telephone Encounter (Signed)
See prev note. Last filled at ER on 08/07/18 #15 tabs with 0 refills

## 2018-08-12 NOTE — Telephone Encounter (Signed)
Pt called office due to being seen on 2/4. Pt was told to call in if she needs a refill on the Tramadol. Pt uses [facility] at [address].

## 2018-08-13 LAB — URINE CULTURE
MICRO NUMBER:: 154676
SPECIMEN QUALITY:: ADEQUATE

## 2018-08-16 ENCOUNTER — Telehealth: Payer: Self-pay | Admitting: Family Medicine

## 2018-08-16 NOTE — Telephone Encounter (Signed)
Pt notified of Dr. Marliss Coots comments. Pt said pain is getting better it's not nearly as bad as it was when she saw Dr. Glori Bickers. Pt said she took on tramadol pill yesterday and hasn't taken one at all today. Pt said that the rash is drying up it's getting "flaky and crusty" pt said overall her sxs have improved significantly but she just wasn't sure if there is anything else she could do to help the healing process because she is still pretty uncomfortable

## 2018-08-16 NOTE — Telephone Encounter (Signed)
She is doing everything right-no more to do  Keep me posted Glad the pain is gradually improving

## 2018-08-16 NOTE — Telephone Encounter (Signed)
Pt still have shingles and want to know if she need to get another refill on   Valacyclovir 1000 mg  Pharmacy [facility]

## 2018-08-16 NOTE — Telephone Encounter (Signed)
Left VM letting pt know Dr. Tower's comments  

## 2018-08-16 NOTE — Telephone Encounter (Signed)
More than 7 days of valcyclovir is not needed (once you finish, that is it)  How are her symptoms? How is the rash? Is tramadol helping the pain ?

## 2018-08-31 ENCOUNTER — Other Ambulatory Visit: Payer: Self-pay | Admitting: Family Medicine

## 2018-09-05 ENCOUNTER — Telehealth: Payer: Self-pay | Admitting: Family Medicine

## 2018-09-05 DIAGNOSIS — N182 Chronic kidney disease, stage 2 (mild): Secondary | ICD-10-CM

## 2018-09-05 DIAGNOSIS — E1151 Type 2 diabetes mellitus with diabetic peripheral angiopathy without gangrene: Secondary | ICD-10-CM

## 2018-09-05 DIAGNOSIS — E1169 Type 2 diabetes mellitus with other specified complication: Secondary | ICD-10-CM

## 2018-09-05 DIAGNOSIS — E538 Deficiency of other specified B group vitamins: Secondary | ICD-10-CM

## 2018-09-05 DIAGNOSIS — E785 Hyperlipidemia, unspecified: Secondary | ICD-10-CM

## 2018-09-05 DIAGNOSIS — I10 Essential (primary) hypertension: Secondary | ICD-10-CM

## 2018-09-05 NOTE — Telephone Encounter (Signed)
-----   Message from Ellamae Sia sent at 08/30/2018  2:56 PM EST ----- Regarding: Lab orders for Thursday, 3.5.20 Patient is scheduled for CPX labs, please order future labs, Thanks , Karna Christmas

## 2018-09-06 DIAGNOSIS — X32XXXA Exposure to sunlight, initial encounter: Secondary | ICD-10-CM | POA: Diagnosis not present

## 2018-09-06 DIAGNOSIS — C44629 Squamous cell carcinoma of skin of left upper limb, including shoulder: Secondary | ICD-10-CM | POA: Diagnosis not present

## 2018-09-06 DIAGNOSIS — L570 Actinic keratosis: Secondary | ICD-10-CM | POA: Diagnosis not present

## 2018-09-06 DIAGNOSIS — L280 Lichen simplex chronicus: Secondary | ICD-10-CM | POA: Diagnosis not present

## 2018-09-06 DIAGNOSIS — D485 Neoplasm of uncertain behavior of skin: Secondary | ICD-10-CM | POA: Diagnosis not present

## 2018-09-06 DIAGNOSIS — D0462 Carcinoma in situ of skin of left upper limb, including shoulder: Secondary | ICD-10-CM | POA: Diagnosis not present

## 2018-09-06 DIAGNOSIS — Z08 Encounter for follow-up examination after completed treatment for malignant neoplasm: Secondary | ICD-10-CM | POA: Diagnosis not present

## 2018-09-06 DIAGNOSIS — Z85828 Personal history of other malignant neoplasm of skin: Secondary | ICD-10-CM | POA: Diagnosis not present

## 2018-09-09 ENCOUNTER — Ambulatory Visit: Payer: PPO

## 2018-09-09 ENCOUNTER — Other Ambulatory Visit (INDEPENDENT_AMBULATORY_CARE_PROVIDER_SITE_OTHER): Payer: HMO

## 2018-09-09 DIAGNOSIS — N182 Chronic kidney disease, stage 2 (mild): Secondary | ICD-10-CM | POA: Diagnosis not present

## 2018-09-09 DIAGNOSIS — E1151 Type 2 diabetes mellitus with diabetic peripheral angiopathy without gangrene: Secondary | ICD-10-CM | POA: Diagnosis not present

## 2018-09-09 DIAGNOSIS — E538 Deficiency of other specified B group vitamins: Secondary | ICD-10-CM | POA: Diagnosis not present

## 2018-09-09 DIAGNOSIS — E1169 Type 2 diabetes mellitus with other specified complication: Secondary | ICD-10-CM | POA: Diagnosis not present

## 2018-09-09 DIAGNOSIS — I10 Essential (primary) hypertension: Secondary | ICD-10-CM | POA: Diagnosis not present

## 2018-09-09 DIAGNOSIS — E785 Hyperlipidemia, unspecified: Secondary | ICD-10-CM

## 2018-09-09 LAB — CBC WITH DIFFERENTIAL/PLATELET
Basophils Absolute: 0.1 10*3/uL (ref 0.0–0.1)
Basophils Relative: 1.4 % (ref 0.0–3.0)
Eosinophils Absolute: 0.2 10*3/uL (ref 0.0–0.7)
Eosinophils Relative: 3.3 % (ref 0.0–5.0)
HCT: 40.8 % (ref 36.0–46.0)
Hemoglobin: 13.6 g/dL (ref 12.0–15.0)
Lymphocytes Relative: 38.6 % (ref 12.0–46.0)
Lymphs Abs: 2.3 10*3/uL (ref 0.7–4.0)
MCHC: 33.2 g/dL (ref 30.0–36.0)
MCV: 97.5 fl (ref 78.0–100.0)
Monocytes Absolute: 0.5 10*3/uL (ref 0.1–1.0)
Monocytes Relative: 8 % (ref 3.0–12.0)
Neutro Abs: 2.9 10*3/uL (ref 1.4–7.7)
Neutrophils Relative %: 48.7 % (ref 43.0–77.0)
Platelets: 240 10*3/uL (ref 150.0–400.0)
RBC: 4.19 Mil/uL (ref 3.87–5.11)
RDW: 14.5 % (ref 11.5–15.5)
WBC: 6 10*3/uL (ref 4.0–10.5)

## 2018-09-09 LAB — COMPREHENSIVE METABOLIC PANEL
ALT: 12 U/L (ref 0–35)
AST: 12 U/L (ref 0–37)
Albumin: 3.9 g/dL (ref 3.5–5.2)
Alkaline Phosphatase: 63 U/L (ref 39–117)
BUN: 17 mg/dL (ref 6–23)
CO2: 30 mEq/L (ref 19–32)
Calcium: 9 mg/dL (ref 8.4–10.5)
Chloride: 105 mEq/L (ref 96–112)
Creatinine, Ser: 0.95 mg/dL (ref 0.40–1.20)
GFR: 56.47 mL/min — ABNORMAL LOW (ref >60.00)
Glucose, Bld: 108 mg/dL — ABNORMAL HIGH (ref 70–99)
Potassium: 3.9 mEq/L (ref 3.5–5.1)
Sodium: 141 mEq/L (ref 135–145)
Total Bilirubin: 0.5 mg/dL (ref 0.2–1.2)
Total Protein: 6.9 g/dL (ref 6.0–8.3)

## 2018-09-09 LAB — HEMOGLOBIN A1C: Hgb A1c MFr Bld: 6.6 % — ABNORMAL HIGH (ref 4.6–6.5)

## 2018-09-09 LAB — LIPID PANEL
Cholesterol: 178 mg/dL (ref 0–200)
HDL: 46.2 mg/dL (ref >39.00)
LDL Cholesterol: 102 mg/dL — ABNORMAL HIGH (ref 0–99)
NonHDL: 132.26
Total CHOL/HDL Ratio: 4
Triglycerides: 153 mg/dL — ABNORMAL HIGH (ref 0.0–149.0)
VLDL: 30.6 mg/dL (ref 0.0–40.0)

## 2018-09-09 LAB — TSH: TSH: 4.46 u[IU]/mL (ref 0.35–4.50)

## 2018-09-09 LAB — VITAMIN B12: Vitamin B-12: 428 pg/mL (ref 211–911)

## 2018-09-10 ENCOUNTER — Telehealth: Payer: Self-pay | Admitting: *Deleted

## 2018-09-10 NOTE — Telephone Encounter (Signed)
I would not think it could be related to shingles unless shingles was in her arm (it was not)-if she developed rash there let me know  Any chest pain or stroke symptoms ? Does this feel like her angina? Any weakness ?

## 2018-09-10 NOTE — Telephone Encounter (Signed)
Patient called stating that she is getting over the shingles. Patient stated that her right arm does not feel right. Patient stated that for 3-4 days she has had some tingling that comes and goes in her right arm. Patient stated that she is not having any other symptoms. Patient wants to know if this is something that she should be concerned about? Patient states that she has an upcoming appointment scheduled with Dr. Glori Bickers next week.

## 2018-09-12 ENCOUNTER — Other Ambulatory Visit: Payer: Self-pay

## 2018-09-12 ENCOUNTER — Encounter: Payer: Self-pay | Admitting: *Deleted

## 2018-09-12 ENCOUNTER — Emergency Department
Admission: EM | Admit: 2018-09-12 | Discharge: 2018-09-12 | Disposition: A | Discharge status: Home / Self Care | Payer: HMO | Attending: Emergency Medicine | Admitting: Emergency Medicine

## 2018-09-12 ENCOUNTER — Emergency Department: Payer: HMO

## 2018-09-12 DIAGNOSIS — I10 Essential (primary) hypertension: Secondary | ICD-10-CM | POA: Diagnosis not present

## 2018-09-12 DIAGNOSIS — Z955 Presence of coronary angioplasty implant and graft: Secondary | ICD-10-CM | POA: Diagnosis not present

## 2018-09-12 DIAGNOSIS — Z79899 Other long term (current) drug therapy: Secondary | ICD-10-CM | POA: Diagnosis not present

## 2018-09-12 DIAGNOSIS — I129 Hypertensive chronic kidney disease with stage 1 through stage 4 chronic kidney disease, or unspecified chronic kidney disease: Secondary | ICD-10-CM | POA: Insufficient documentation

## 2018-09-12 DIAGNOSIS — E1122 Type 2 diabetes mellitus with diabetic chronic kidney disease: Secondary | ICD-10-CM | POA: Diagnosis not present

## 2018-09-12 DIAGNOSIS — Z8673 Personal history of transient ischemic attack (TIA), and cerebral infarction without residual deficits: Secondary | ICD-10-CM | POA: Insufficient documentation

## 2018-09-12 DIAGNOSIS — R040 Epistaxis: Secondary | ICD-10-CM

## 2018-09-12 DIAGNOSIS — N182 Chronic kidney disease, stage 2 (mild): Secondary | ICD-10-CM | POA: Insufficient documentation

## 2018-09-12 DIAGNOSIS — R202 Paresthesia of skin: Secondary | ICD-10-CM

## 2018-09-12 DIAGNOSIS — Z7984 Long term (current) use of oral hypoglycemic drugs: Secondary | ICD-10-CM | POA: Insufficient documentation

## 2018-09-12 DIAGNOSIS — Z85828 Personal history of other malignant neoplasm of skin: Secondary | ICD-10-CM | POA: Diagnosis not present

## 2018-09-12 DIAGNOSIS — I2510 Atherosclerotic heart disease of native coronary artery without angina pectoris: Secondary | ICD-10-CM | POA: Insufficient documentation

## 2018-09-12 LAB — COMPREHENSIVE METABOLIC PANEL
ALT: 16 U/L (ref 0–44)
AST: 19 U/L (ref 15–41)
Albumin: 3.9 g/dL (ref 3.5–5.0)
Alkaline Phosphatase: 58 U/L (ref 38–126)
Anion gap: 10 (ref 5–15)
BUN: 16 mg/dL (ref 8–23)
CO2: 24 mmol/L (ref 22–32)
Calcium: 9.1 mg/dL (ref 8.9–10.3)
Chloride: 105 mmol/L (ref 98–111)
Creatinine, Ser: 0.85 mg/dL (ref 0.44–1.00)
GFR calc Af Amer: >60 mL/min (ref >60)
GFR calc non Af Amer: >60 mL/min (ref >60)
Glucose, Bld: 94 mg/dL (ref 70–99)
Potassium: 4 mmol/L (ref 3.5–5.1)
Sodium: 139 mmol/L (ref 135–145)
Total Bilirubin: 0.8 mg/dL (ref 0.3–1.2)
Total Protein: 7.5 g/dL (ref 6.5–8.1)

## 2018-09-12 LAB — CBC WITH DIFFERENTIAL/PLATELET
Abs Immature Granulocytes: 0.01 10*3/uL (ref 0.00–0.07)
Basophils Absolute: 0.1 10*3/uL (ref 0.0–0.1)
Basophils Relative: 1 %
Eosinophils Absolute: 0.2 10*3/uL (ref 0.0–0.5)
Eosinophils Relative: 3 %
HCT: 40.2 % (ref 36.0–46.0)
Hemoglobin: 13.5 g/dL (ref 12.0–15.0)
Immature Granulocytes: 0 %
Lymphocytes Relative: 31 %
Lymphs Abs: 2.1 10*3/uL (ref 0.7–4.0)
MCH: 32.2 pg (ref 26.0–34.0)
MCHC: 33.6 g/dL (ref 30.0–36.0)
MCV: 95.9 fL (ref 80.0–100.0)
Monocytes Absolute: 0.5 10*3/uL (ref 0.1–1.0)
Monocytes Relative: 8 %
Neutro Abs: 3.7 10*3/uL (ref 1.7–7.7)
Neutrophils Relative %: 57 %
Platelets: 241 10*3/uL (ref 150–400)
RBC: 4.19 MIL/uL (ref 3.87–5.11)
RDW: 13.5 % (ref 11.5–15.5)
WBC: 6.5 10*3/uL (ref 4.0–10.5)
nRBC: 0 % (ref 0.0–0.2)

## 2018-09-12 LAB — PROTIME-INR
INR: 1 (ref 0.8–1.2)
Prothrombin Time: 12.7 seconds (ref 11.4–15.2)

## 2018-09-12 LAB — APTT: aPTT: 29 seconds (ref 24–36)

## 2018-09-12 LAB — GLUCOSE, CAPILLARY: Glucose-Capillary: 80 mg/dL (ref 70–99)

## 2018-09-12 MED ORDER — OXYMETAZOLINE HCL 0.05 % NA SOLN
1.0000 | Freq: Once | NASAL | Status: AC
  Administered 2018-09-12: 1 via NASAL
  Filled 2018-09-12: qty 30

## 2018-09-12 MED ORDER — OXYMETAZOLINE HCL 0.05 % NA SOLN: 2.0000 | Freq: Two times a day (BID) | NASAL | 0 refills | Status: AC

## 2018-09-12 NOTE — ED Triage Notes (Signed)
Patient c/o right arm "tingling" for 3-4 days. Patient called her MD, but did not receive a call back. Patient also c/o intermittent nose bleed with clots. Patient denies nausea, dizziness or pain at this time. Patient c/o tremors in right hand that began last night.

## 2018-09-12 NOTE — ED Notes (Signed)
.   Pt is resting, Respirations even and unlabored, NAD. Stretcher lowest postion and locked. Call bell within reach. Denies any needs at this time RN will continue to monitor.  Family at bedside.  

## 2018-09-12 NOTE — ED Notes (Signed)
Pt presents with numbness that started 2-3 days ago, pt states she had a nose bleed today and that made her come to the ER. Pt states she has the numbness that comes and goes. Pt has hx of TIA 2017 and STENT 12/2017. Family at bedside. PT is NAD

## 2018-09-12 NOTE — ED Notes (Signed)
Patient taken to CT scan.

## 2018-09-12 NOTE — ED Notes (Signed)
CBG:90 

## 2018-09-12 NOTE — ED Provider Notes (Signed)
[facility] Emergency Department Provider Note ____________________________________________   First MD Initiated Contact with Patient 09/12/18 1329     (approximate)  I have reviewed the triage vital signs and the nursing notes.   HISTORY  Chief Complaint Numbness    HPI Margaret Vazquez is a 83 y.o. female with PMH as noted below who presents with 2 main complaints.  She has had right upper extremity intermittent tingling over the last 3 to 4 days.  It is not associated with actual numbness or weakness.  She states it lasts for a few seconds to a few minutes and then resolves.  It happens approximately every 15 minutes.  She has had no trauma.  She denies any neck pain.  She has no tingling, numbness, or droop in her face or other extremities.  She denies any vision changes or speech disturbance.  In addition the patient reports epistaxis from her left nare since this morning described as a small amount of blood which she dabs with a napkin.  She has had a few small clots.  She denies any prior epistaxis history.  Past Medical History:  Diagnosis Date  . Allergy history, drug    Aspirin  . Basal cell carcinoma    "back"  . CAD (coronary artery disease)    a. Previously nonobstructive then progressive angina with abnl CT -> cardiac cath 12/24/17 showed 30% mid RCA and 80% prox-mid Cx. She received DES to mid AV groove Cx. EF 55-65%.   . Cervical stenosis of spine    With neck pain  . CKD (chronic kidney disease), stage II   . Colon polyps 2009  . Complication of anesthesia 1980s   slow to wake after anesthesia "when I had breast biopsy"  . GERD (gastroesophageal reflux disease)   . Hyperlipidemia    myalgias with Lipitor and Zetia  . Hypertension   . Migraine    "stopped at age 33" (12/24/2017)  . Squamous carcinoma    "iced off and cut off; mostly arms" (12/24/2017)  . Stroke [facility])    "told me I'd had 2 strokes in 02/2013"; denies residual on 12/24/2017   . TIA (transient ischemic attack)   . Type II diabetes mellitus [facility])     Patient Active Problem List   Diagnosis Date Noted  . Herpes zoster 08/10/2018  . Acute pain of left lower extremity 08/02/2018  . Plantar wart, left foot 06/15/2018  . Progressive angina ([place]) 12/25/2017  . CKD (chronic kidney disease), stage II 12/25/2017  . CAD (coronary artery disease) 12/24/2017  . Routine general medical examination at a health care facility 08/24/2016  . Paroxysmal atrial fibrillation ([place]) 04/07/2016  . Fall 08/13/2015  . Obesity (BMI 30-39.9) 08/23/2014  . Type 2 diabetes mellitus with diabetic peripheral angiopathy without gangrene, without long-term current use of insulin ([place]) 01/12/2014  . TIA (transient ischemic attack) 01/11/2014  . CVA (cerebral infarction) 01/11/2014  . LOOP Recorder LINQ 06/07/2013  . Fatigue 06/07/2013  . Sinusitis, chronic 05/16/2013  . H/O: CVA (cerebrovascular accident) 02/15/2013  . Encounter for Medicare annual wellness exam 10/27/2012  . Encopresis(307.7) 12/15/2011  . Post-menopausal 10/28/2011  . PERSONAL HX COLONIC POLYPS 09/04/2009  . B12 deficiency 03/06/2009  . ALLERGIC RHINITIS 02/14/2008  . BACK PAIN, LUMBAR 11/03/2007  . Hyperlipidemia associated with type 2 diabetes mellitus ([place]) 11/24/2006  . Essential hypertension 11/24/2006  . GERD 10/09/2006  . OVERACTIVE BLADDER 10/09/2006  . INCONTINENCE, URGE 10/09/2006  . SKIN CANCER, HX  OF 10/09/2006    Past Surgical History:  Procedure Laterality Date  . ABD [place]  07/2003   Negative  . APPENDECTOMY    . BASAL CELL CARCINOMA EXCISION     "back"  . BREAST BIOPSY Left 1992   benign  . CARDIAC CATHETERIZATION  04/07/2011   non obst CAD (Dr Burt Knack)  . CATARACT EXTRACTION W/PHACO Left 04/01/2017   Procedure: CATARACT EXTRACTION PHACO AND INTRAOCULAR LENS PLACEMENT ([place]) LEFT DIABETIC;  Surgeon: Leandrew Koyanagi, MD;  Location: [place];  Service: Ophthalmology;  Laterality:  Left;  Diabetic - oral meds  . CATARACT EXTRACTION W/PHACO Right 04/29/2017   Procedure: CATARACT EXTRACTION PHACO AND INTRAOCULAR LENS PLACEMENT ([place]) RIGHT DIABETIC;  Surgeon: Leandrew Koyanagi, MD;  Location: [place];  Service: Ophthalmology;  Laterality: Right;  Diabetic - oral meds  . COLONOSCOPY  12/2007   Adenomatous colon polyps  . CORONARY ANGIOPLASTY WITH STENT PLACEMENT  12/24/2017  . CORONARY STENT INTERVENTION N/A 12/24/2017   Procedure: CORONARY STENT INTERVENTION;  Surgeon: Lorretta Harp, MD;  Location: [place] CV LAB;  Service: Cardiovascular;  Laterality: N/A;  . CYSTOSCOPY W/ DECANNULATION  03/2000   Normal  . LEFT HEART CATH AND CORONARY ANGIOGRAPHY N/A 12/24/2017   Procedure: LEFT HEART CATH AND CORONARY ANGIOGRAPHY;  Surgeon: Lorretta Harp, MD;  Location: [place] CV LAB;  Service: Cardiovascular;  Laterality: N/A;  . LOOP RECORDER IMPLANT N/A 02/16/2013   Procedure: LOOP RECORDER IMPLANT;  Surgeon: Deboraha Sprang, MD;  Location: [facility] CATH LAB;  Service: Cardiovascular;  Laterality: N/A;  . NASAL SINUS SURGERY  01/2005  . SQUAMOUS CELL CARCINOMA EXCISION     "mostly arms;" (12/24/2017)  . STRESS CARDIOLITE  11/1999   Normal/ negative  . TEAR DUCT PROBING  2005   "? side"  . TEE WITHOUT CARDIOVERSION N/A 02/16/2013   Procedure: TRANSESOPHAGEAL ECHOCARDIOGRAM (TEE);  Surgeon: Josue Hector, MD;  Location[facility] ENDOSCOPY;  Service: Cardiovascular;  Laterality: N/A;  . TUBAL LIGATION     BTL    Prior to Admission medications   Medication Sig Start Date End Date Taking? Authorizing Provider  diphenhydrAMINE (BENADRYL) 25 MG tablet Take 50 mg by mouth every 8 (eight) hours as needed for allergies.     [provider]  EPINEPHrine (EPI-PEN) 0.3 mg/0.3 mL DEVI Inject 0.3 mg into the muscle daily as needed (allergic reaction).  12/20/10   Tower, [name], MD  erythromycin ophthalmic ointment  07/12/18   [provider]  fluticasone  (FLONASE) 50 MCG/ACT nasal spray Place 2 sprays into both nostrils 2 (two) times daily as needed. Reported on 09/10/2015 01/20/14   [provider]  glipiZIDE (GLUCOTROL XL) 10 MG 24 hr tablet TAKE 1 TABLET BY MOUTH DAILY WITH BREAKFAST 08/31/18   Tower, [name], MD  losartan-hydrochlorothiazide (HYZAAR) 50-12.5 MG tablet Take 1 tablet by mouth daily.     [provider]  metFORMIN (GLUCOPHAGE-XR) 500 MG 24 hr tablet TAKE 1 TABLET(500 MG) BY MOUTH DAILY WITH BREAKFAST 08/31/18   Tower, [name], MD  nitroGLYCERIN (NITROSTAT) 0.4 MG SL tablet Place 1 tablet (0.4 mg total) under the tongue every 5 (five) minutes as needed for chest pain (up to 3 doses. If taking 3rd dose call 911). 12/25/17   Dunn, Nedra Hai, PA-C  oxymetazoline (AFRIN) 0.05 % nasal spray Place 2 sprays into left nostril every 12 (twelve) hours for 3 days. 09/12/18 09/15/18  Arta Silence, MD  potassium chloride SA (K-DUR,KLOR-CON) 20 MEQ  tablet Take 1 tablet (20 mEq total) by mouth daily. 09/08/17   Tower, [name], MD  prednisoLONE acetate (PRED FORTE) 1 % ophthalmic suspension SHAKE LQ AND INT 1 GTT IN OU BID FOR 10 DAYS 05/28/18   [provider]  sulfamethoxazole-trimethoprim (BACTRIM DS,SEPTRA DS) 800-160 MG tablet Take 1 tablet by mouth 2 (two) times daily. 08/10/18   Tower, [name], MD  ticagrelor (BRILINTA) 90 MG TABS tablet Take 1 tablet (90 mg total) by mouth 2 (two) times daily. 12/25/17   Dunn, Nedra Hai, PA-C  tiZANidine (ZANAFLEX) 4 MG tablet Take 1 tablet by mouth at bedtime as needed for muscle spasm/sleep. 08/02/18   Pleas Koch, NP  traMADol (ULTRAM) 50 MG tablet Take 1 tablet (50 mg total) by mouth every 6 (six) hours as needed. 08/12/18   Tower, [name], MD  valACYclovir (VALTREX) 1000 MG tablet Take 1 tablet (1,000 mg total) by mouth 3 (three) times daily. 08/07/18   Wardell Honour, MD    Allergies Bee venom; Nabumetone; Amoxicillin-pot clavulanate; Aspirin; Atorvastatin; Clopidogrel bisulfate;  Codeine; Ezetimibe; Metformin and related; Valsartan; and Other  Family History  Problem Relation Age of Onset  . Lung cancer Brother   . Diabetes Brother   . Pancreatic cancer Brother   . Heart disease Mother   . Heart disease Father   . Brain cancer Other   . Skin cancer Daughter   . Diabetes Sister   . Colon cancer Neg Hx     Social History Social History   Tobacco Use  . Smoking status: Never Smoker  . Smokeless tobacco: Never Used  Substance Use Topics  . Alcohol use: Never    Alcohol/week: 0.0 standard drinks    Frequency: Never  . Drug use: Never    Review of Systems  Constitutional: No fever. Eyes: No visual changes. ENT: No neck pain. Cardiovascular: Denies chest pain. Respiratory: Denies shortness of breath. Gastrointestinal: No vomiting. Genitourinary: Negative for dysuria.  Musculoskeletal: Negative for back pain. Skin: Negative for rash. Neurological: Negative for headaches, focal weakness or numbness.   ____________________________________________   PHYSICAL EXAM:  VITAL SIGNS: ED Triage Vitals  Enc Vitals Group     BP 09/12/18 1240 (!) 152/86     Pulse Rate 09/12/18 1240 (!) 55     Resp 09/12/18 1240 18     Temp 09/12/18 1240 97.8 F (36.6 C)     Temp Source 09/12/18 1240 Oral     SpO2 09/12/18 1240 96 %     Weight 09/12/18 1241 192 lb (87.1 kg)     Height 09/12/18 1241 5' 8"$  (1.727 m)     Head Circumference --      Peak Flow --      Pain Score 09/12/18 1241 0     Pain Loc --      Pain Edu? --      Excl. in [place]? --     Constitutional: Alert and oriented. Well appearing and in no acute distress. Eyes: Conjunctivae are normal.  Head: Atraumatic. Nose: No congestion/rhinnorhea. Mouth/Throat: Mucous membranes are moist.  Small amount of blood at anterior left nare. Neck: Normal range of motion.  Cardiovascular: Normal rate, regular rhythm. Grossly normal heart sounds.  Good peripheral circulation. Respiratory: Normal respiratory  effort.  No retractions. Lungs CTAB. Gastrointestinal: No distention.  Musculoskeletal:  Extremities warm and well perfused.  Neurologic:  Normal speech and language.  5/5 motor strength and intact sensation of bilateral upper and lower extremities.  No facial droop.  Cranial nerves III through XII intact.  Normal coordination with no ataxia.  No gross focal neurologic deficits are appreciated.  Skin:  Skin is warm and dry. No rash noted. Psychiatric: Mood and affect are normal. Speech and behavior are normal.  ____________________________________________   LABS (all labs ordered are listed, but only abnormal results are displayed)  Labs Reviewed  CBC WITH DIFFERENTIAL/PLATELET  COMPREHENSIVE METABOLIC PANEL  APTT  PROTIME-INR  GLUCOSE, CAPILLARY   ____________________________________________  EKG  ED ECG REPORT I, Arta Silence, the attending physician, personally viewed and interpreted this ECG.  Date: 09/12/2018 EKG Time: 1316 Rate: 61 Rhythm: normal sinus rhythm QRS Axis: normal Intervals: normal ST/T Wave abnormalities: normal Narrative Interpretation: no evidence of acute ischemia  ____________________________________________  RADIOLOGY  CT head: No acute abnormality  ____________________________________________   PROCEDURES  Procedure(s) performed: No  Procedures  Critical Care performed: No ____________________________________________   INITIAL IMPRESSION / ASSESSMENT AND PLAN / ED COURSE  Pertinent labs & imaging results that were available during my care of the patient were reviewed by me and considered in my medical decision making (see chart for details).  83 year old female with PMH as noted above presents with small volume of anterior epistaxis from left nare as well as with intermittent right arm paresthesias with no other neurologic symptoms.  On exam, the patient is very well-appearing for her age.  Her vital signs are normal except  for hypertension.  Neuro exam is nonfocal.  She has minimal blood in the left nare which she is able to dab with a tissue.  The epistaxis is consistent with mild anterior epistaxis.  There is no indication for packing.  I will administer Afrin.  The right upper extremity paresthesias are consistent with radiculopathy or other benign etiology.  I have an extremely low suspicion for CNS cause.  We will obtain a CT to rule out any findings suggestive of subacute stroke as well as some basic labs.  ----------------------------------------- 3:15 PM on 09/12/2018 -----------------------------------------  Lab work-up is completely within normal limits.  CT head shows no acute findings.  The patient's epistaxis has resolved with Afrin.  She feels well and would like to go home.  She is stable for discharge at this time.  Return precautions given, and she expresses understanding.  She has follow-up with her doctor 4 days from now. ____________________________________________   FINAL CLINICAL IMPRESSION(S) / ED DIAGNOSES  Final diagnoses:  Anterior epistaxis  Paresthesia of right upper extremity      NEW MEDICATIONS STARTED DURING THIS VISIT:  New Prescriptions   OXYMETAZOLINE (AFRIN) 0.05 % NASAL SPRAY    Place 2 sprays into left nostril every 12 (twelve) hours for 3 days.     Note:  This document was prepared using Dragon voice recognition software and may include unintentional dictation errors.    Arta Silence, MD 09/12/18 1515

## 2018-09-12 NOTE — Discharge Instructions (Addendum)
Follow-up with your doctor this week as scheduled.  Use the Afrin every 12 hours for the next 2 to 3 days.  Return to the ER for new or worsening nosebleed, weakness or numbness, or any other new or worsening symptoms that concern you.

## 2018-09-12 NOTE — ED Notes (Addendum)
NAD noted at time of D/C. Pt denies questions or concerns. Pt taken to the lobby via wheelchair by Yong Channel, Therapist, sports.

## 2018-09-13 ENCOUNTER — Other Ambulatory Visit: Payer: Self-pay | Admitting: Pharmacist

## 2018-09-13 ENCOUNTER — Telehealth: Payer: Self-pay | Admitting: *Deleted

## 2018-09-13 ENCOUNTER — Ambulatory Visit (INDEPENDENT_AMBULATORY_CARE_PROVIDER_SITE_OTHER): Payer: HMO | Admitting: Family Medicine

## 2018-09-13 ENCOUNTER — Encounter: Payer: Self-pay | Admitting: Family Medicine

## 2018-09-13 VITALS — BP 112/64 | HR 72 | Temp 97.8°F | Ht 68.25 in | Wt 189.4 lb

## 2018-09-13 DIAGNOSIS — N182 Chronic kidney disease, stage 2 (mild): Secondary | ICD-10-CM | POA: Diagnosis not present

## 2018-09-13 DIAGNOSIS — I480 Paroxysmal atrial fibrillation: Secondary | ICD-10-CM | POA: Diagnosis not present

## 2018-09-13 DIAGNOSIS — E669 Obesity, unspecified: Secondary | ICD-10-CM

## 2018-09-13 DIAGNOSIS — I10 Essential (primary) hypertension: Secondary | ICD-10-CM

## 2018-09-13 DIAGNOSIS — Z23 Encounter for immunization: Secondary | ICD-10-CM | POA: Diagnosis not present

## 2018-09-13 DIAGNOSIS — Z0000 Encounter for general adult medical examination without abnormal findings: Secondary | ICD-10-CM | POA: Diagnosis not present

## 2018-09-13 DIAGNOSIS — E538 Deficiency of other specified B group vitamins: Secondary | ICD-10-CM

## 2018-09-13 DIAGNOSIS — E1151 Type 2 diabetes mellitus with diabetic peripheral angiopathy without gangrene: Secondary | ICD-10-CM

## 2018-09-13 DIAGNOSIS — R202 Paresthesia of skin: Secondary | ICD-10-CM | POA: Diagnosis not present

## 2018-09-13 DIAGNOSIS — E785 Hyperlipidemia, unspecified: Secondary | ICD-10-CM

## 2018-09-13 DIAGNOSIS — E1169 Type 2 diabetes mellitus with other specified complication: Secondary | ICD-10-CM | POA: Diagnosis not present

## 2018-09-13 DIAGNOSIS — Z1231 Encounter for screening mammogram for malignant neoplasm of breast: Secondary | ICD-10-CM | POA: Diagnosis not present

## 2018-09-13 DIAGNOSIS — B029 Zoster without complications: Secondary | ICD-10-CM | POA: Diagnosis not present

## 2018-09-13 MED ORDER — METFORMIN HCL ER 500 MG PO TB24: ORAL_TABLET | ORAL | 3 refills | Status: DC

## 2018-09-13 MED ORDER — GLIPIZIDE ER 10 MG PO TB24: 10.0000 mg | ORAL_TABLET | Freq: Every day | ORAL | 3 refills | Status: DC

## 2018-09-13 MED ORDER — POTASSIUM CHLORIDE CRYS ER 20 MEQ PO TBCR: 20.0000 meq | EXTENDED_RELEASE_TABLET | Freq: Every day | ORAL | 3 refills | Status: DC

## 2018-09-13 NOTE — Assessment & Plan Note (Signed)
Tingling of R arm on and off  No motor changes Went to ED and CT of head was normal ? If poss radicular  Will monitor and alert if this worsens or fails to improve

## 2018-09-13 NOTE — Assessment & Plan Note (Signed)
Rash is healed-still some discomfort  Planning to get on wt list for shingrix when symptoms improve further

## 2018-09-13 NOTE — Telephone Encounter (Signed)
That is from her hospitalization from 6/19 - there was a question of it and then labs improved  I plan to remove this problem from her medicine list  Thanks

## 2018-09-13 NOTE — Assessment & Plan Note (Signed)
No evidence of this

## 2018-09-13 NOTE — Assessment & Plan Note (Signed)
Disc goals for lipids and reasons to control them Rev last labs with pt Rev low sat fat diet in detail slt imp in LDL but not at goal Intol of 4 statins and zetia  Now in a study for new medication for lipids

## 2018-09-13 NOTE — Assessment & Plan Note (Signed)
Reviewed health habits including diet and exercise and skin cancer prevention Reviewed appropriate screening tests for age  Also reviewed health mt list, fam hx and immunization status , as well as social and family history   See HPI Labs reviewed  Mammogram referral done  Pt will consider shingrix later on  dexa 5/13 normal No cognitive worries /changes Advance directive discussed- pt has paperwork to complete and plans to do it and bring a copy  Flu vaccine today

## 2018-09-13 NOTE — Telephone Encounter (Signed)
Did not get this message Friday (error), advised pt DR. Tower will address with her at [place] today

## 2018-09-13 NOTE — Assessment & Plan Note (Signed)
bp in fair control at this time  BP Readings from Last 1 Encounters:  09/13/18 112/64   No changes needed Most recent labs reviewed  Disc lifstyle change with low sodium diet and exercise

## 2018-09-13 NOTE — Progress Notes (Signed)
Subjective:    Patient ID: Margaret Vazquez, female    DOB: Feb 19, 1936, 83 y.o.   MRN: [id number]  HPI I have personally reviewed the Medicare Annual Wellness questionnaire and have noted 1. The patient's medical and social history 2. Their use of alcohol, tobacco or illicit drugs 3. Their current medications and supplements 4. The patient's functional ability including ADL's, fall risks, home safety risks and hearing or visual             impairment. 5. Diet and physical activities 6. Evidence for depression or mood disorders  The patients weight, height, BMI have been recorded in the chart and visual acuity is per eye clinic.  I have made referrals, counseling and provided education to the patient based review of the above and I have provided the pt with a written personalized care plan for preventive services. Reviewed and updated provider list, see scanned forms.  See scanned forms.  Routine anticipatory guidance given to patient.  See health maintenance. Colon cancer screening  Colonoscopy 9/13 (5 y recall) -did not do due to age cologuard neg 3/19  Breast cancer screening 3/19 mammogram -she wants [place] to set it up  Self breast exam- no lumps  Flu vaccine-did not get  Tetanus vaccine 4/13 Pneumovax completed Zoster vaccine - just had shingles /waiting for pain to abate before vaccination  dexa 5/13 normal bmd  No falls or fractures and takes vit D and ca  Advance directive- does not have  Cognitive function addressed- see scanned forms- and if abnormal then additional documentation follows.  No issues or problems   PMH and SH reviewed  Meds, vitals, and allergies reviewed.   ROS: See HPI.  Otherwise negative.    Wt Readings from Last 3 Encounters:  09/12/18 192 lb (87.1 kg)  08/02/18 194 lb (88 kg)  07/14/18 191 lb 12 oz (87 kg)   wt is stable    28.59 kg/m    Hearing Screening   125Hz$  250Hz$  500Hz$  1000Hz$  2000Hz$  3000Hz$  4000Hz$  6000Hz$  8000Hz$   Right ear:   0 0 0  40     Left ear:   0 0 40  40    Vision Screening Comments: Pt had eye exam in Sept 2019 at [facility] center has not noticed hearing loss- not bothered by this  Husband does turn to TV too loud for her actually   Was seen in ED for RUE tingling yesterday (CT of head ok and was released) Thinks it was due to sleeping on that side  Also epistaxis L nare (mild) - tx with afrin (resolved)   bp is stable today  No cp or palpitations or headaches or edema  No side effects to medicines  BP Readings from Last 3 Encounters:  09/13/18 112/64  09/12/18 (!) 151/65  08/10/18 (!) 110/58     Pulse Readings from Last 3 Encounters:  09/13/18 72  09/12/18 61  08/10/18 86    DM2 Lab Results  Component Value Date   HGBA1C 6.6 (H) 09/09/2018  last time 6.5  [facility]  Diet -good Exercise - none /does not feel like it (basement steps frequently) -no motivation to do more than that   Hyperlipidemia Lab Results  Component Value Date   CHOL 178 09/09/2018   CHOL 185 06/08/2018   CHOL 200 09/01/2017   Lab Results  Component Value Date   HDL 46.20 09/09/2018   HDL 42.60 06/08/2018   HDL 48.80 09/01/2017  Lab Results  Component Value Date   LDLCALC 102 (H) 09/09/2018   LDLCALC 117 (H) 06/08/2018   LDLCALC 114 (H) 09/01/2017   Lab Results  Component Value Date   TRIG 153.0 (H) 09/09/2018   TRIG 129.0 06/08/2018   TRIG 188.0 (H) 09/01/2017   Lab Results  Component Value Date   CHOLHDL 4 09/09/2018   CHOLHDL 4 06/08/2018   CHOLHDL 4 09/01/2017   Lab Results  Component Value Date   LDLDIRECT 107.0 08/27/2016   LDLDIRECT 136.5 10/20/2011   LDLDIRECT 134.7 08/08/2008  intol of 4 statins and zetia  She is in a clinical study for cholesterol medicine   CKD 2 Lab Results  Component Value Date   CREATININE 0.85 09/12/2018   BUN 16 09/12/2018   NA 139 09/12/2018   K 4.0 09/12/2018   CL 105 09/12/2018   CO2 24 09/12/2018    Lab Results  Component Value Date    WBC 6.5 09/12/2018   HGB 13.5 09/12/2018   HCT 40.2 09/12/2018   MCV 95.9 09/12/2018   PLT 241 09/12/2018    Lab Results  Component Value Date   TSH 4.46 09/09/2018    Lab Results  Component Value Date   ALT 16 09/12/2018   AST 19 09/12/2018   ALKPHOS 58 09/12/2018   BILITOT 0.8 09/12/2018    Patient Active Problem List   Diagnosis Date Noted  . Medicare annual wellness visit, subsequent 09/13/2018  . Screening mammogram, encounter for 09/13/2018  . Herpes zoster 08/10/2018  . Acute pain of left lower extremity 08/02/2018  . Plantar wart, left foot 06/15/2018  . Progressive angina ([place]) 12/25/2017  . CKD (chronic kidney disease), stage II 12/25/2017  . CAD (coronary artery disease) 12/24/2017  . Routine general medical examination at a health care facility 08/24/2016  . PAF (paroxysmal atrial fibrillation) ([place]) 04/07/2016  . Fall 08/13/2015  . Obesity (BMI 30-39.9) 08/23/2014  . Type 2 diabetes mellitus with diabetic peripheral angiopathy without gangrene, without long-term current use of insulin ([place]) 01/12/2014  . TIA (transient ischemic attack) 01/11/2014  . CVA (cerebral infarction) 01/11/2014  . LOOP Recorder LINQ 06/07/2013  . Fatigue 06/07/2013  . Sinusitis, chronic 05/16/2013  . H/O: CVA (cerebrovascular accident) 02/15/2013  . Encounter for Medicare annual wellness exam 10/27/2012  . Encopresis(307.7) 12/15/2011  . Post-menopausal 10/28/2011  . PERSONAL HX COLONIC POLYPS 09/04/2009  . B12 deficiency 03/06/2009  . ALLERGIC RHINITIS 02/14/2008  . BACK PAIN, LUMBAR 11/03/2007  . Hyperlipidemia associated with type 2 diabetes mellitus ([place]) 11/24/2006  . Essential hypertension 11/24/2006  . GERD 10/09/2006  . OVERACTIVE BLADDER 10/09/2006  . INCONTINENCE, URGE 10/09/2006  . SKIN CANCER, HX OF 10/09/2006   Past Medical History:  Diagnosis Date  . Allergy history, drug    Aspirin  . Basal cell carcinoma    "back"  . CAD (coronary artery disease)    a.  Previously nonobstructive then progressive angina with abnl CT -> cardiac cath 12/24/17 showed 30% mid RCA and 80% prox-mid Cx. She received DES to mid AV groove Cx. EF 55-65%.   . Cervical stenosis of spine    With neck pain  . CKD (chronic kidney disease), stage II   . Colon polyps 2009  . Complication of anesthesia 1980s   slow to wake after anesthesia "when I had breast biopsy"  . GERD (gastroesophageal reflux disease)   . Hyperlipidemia    myalgias with Lipitor and Zetia  . Hypertension   . Migraine    "  stopped at age 30" (12/24/2017)  . Squamous carcinoma    "iced off and cut off; mostly arms" (12/24/2017)  . Stroke [facility])    "told me I'd had 2 strokes in 02/2013"; denies residual on 12/24/2017  . TIA (transient ischemic attack)   . Type II diabetes mellitus ([place])    Past Surgical History:  Procedure Laterality Date  . ABD [place]  07/2003   Negative  . APPENDECTOMY    . BASAL CELL CARCINOMA EXCISION     "back"  . BREAST BIOPSY Left 1992   benign  . CARDIAC CATHETERIZATION  04/07/2011   non obst CAD (Dr Burt Knack)  . CATARACT EXTRACTION W/PHACO Left 04/01/2017   Procedure: CATARACT EXTRACTION PHACO AND INTRAOCULAR LENS PLACEMENT ([place]) LEFT DIABETIC;  Surgeon: Leandrew Koyanagi, MD;  Location: [place];  Service: Ophthalmology;  Laterality: Left;  Diabetic - oral meds  . CATARACT EXTRACTION W/PHACO Right 04/29/2017   Procedure: CATARACT EXTRACTION PHACO AND INTRAOCULAR LENS PLACEMENT ([place]) RIGHT DIABETIC;  Surgeon: Leandrew Koyanagi, MD;  Location: [place];  Service: Ophthalmology;  Laterality: Right;  Diabetic - oral meds  . COLONOSCOPY  12/2007   Adenomatous colon polyps  . CORONARY ANGIOPLASTY WITH STENT PLACEMENT  12/24/2017  . CORONARY STENT INTERVENTION N/A 12/24/2017   Procedure: CORONARY STENT INTERVENTION;  Surgeon: Lorretta Harp, MD;  Location: [place] CV LAB;  Service: Cardiovascular;  Laterality: N/A;  . CYSTOSCOPY W/ DECANNULATION  03/2000    Normal  . LEFT HEART CATH AND CORONARY ANGIOGRAPHY N/A 12/24/2017   Procedure: LEFT HEART CATH AND CORONARY ANGIOGRAPHY;  Surgeon: Lorretta Harp, MD;  Location: [place] CV LAB;  Service: Cardiovascular;  Laterality: N/A;  . LOOP RECORDER IMPLANT N/A 02/16/2013   Procedure: LOOP RECORDER IMPLANT;  Surgeon: Deboraha Sprang, MD;  Location: [facility] CATH LAB;  Service: Cardiovascular;  Laterality: N/A;  . NASAL SINUS SURGERY  01/2005  . SQUAMOUS CELL CARCINOMA EXCISION     "mostly arms;" (12/24/2017)  . STRESS CARDIOLITE  11/1999   Normal/ negative  . TEAR DUCT PROBING  2005   "? side"  . TEE WITHOUT CARDIOVERSION N/A 02/16/2013   Procedure: TRANSESOPHAGEAL ECHOCARDIOGRAM (TEE);  Surgeon: Josue Hector, MD;  Location: [facility] ENDOSCOPY;  Service: Cardiovascular;  Laterality: N/A;  . TUBAL LIGATION     BTL   Social History   Tobacco Use  . Smoking status: Never Smoker  . Smokeless tobacco: Never Used  Substance Use Topics  . Alcohol use: Never    Alcohol/week: 0.0 standard drinks    Frequency: Never  . Drug use: Never   Family History  Problem Relation Age of Onset  . Lung cancer Brother   . Diabetes Brother   . Pancreatic cancer Brother   . Heart disease Mother   . Heart disease Father   . Brain cancer Other   . Skin cancer Daughter   . Diabetes Sister   . Colon cancer Neg Hx    Allergies  Allergen Reactions  . Bee Venom Hives, Shortness Of Breath and Swelling  . Nabumetone Anaphylaxis  . Amoxicillin-Pot Clavulanate Hives and Swelling    To lips.  . Aspirin Hives  . Atorvastatin Swelling     joint pain/swelling, inc liver tests  . Clopidogrel Bisulfate Hives  . Codeine Nausea And Vomiting  . Ezetimibe Other (See Comments)     fatigue  . Metformin And Related Other (See Comments)    Diarrhea   . Valsartan Other (See Comments)  fatigue  . Other Hives    **Red Meat**  SOB   Current Outpatient Medications on File Prior to Visit  Medication Sig Dispense Refill  .  diphenhydrAMINE (BENADRYL) 25 MG tablet Take 50 mg by mouth every 8 (eight) hours as needed for allergies.     Marland Kitchen EPINEPHrine (EPI-PEN) 0.3 mg/0.3 mL DEVI Inject 0.3 mg into the muscle daily as needed (allergic reaction).     Marland Kitchen erythromycin ophthalmic ointment     . fluticasone (FLONASE) 50 MCG/ACT nasal spray Place 2 sprays into both nostrils 2 (two) times daily as needed. Reported on 09/10/2015    . losartan-hydrochlorothiazide (HYZAAR) 50-12.5 MG tablet Take 1 tablet by mouth daily.     . nitroGLYCERIN (NITROSTAT) 0.4 MG SL tablet Place 1 tablet (0.4 mg total) under the tongue every 5 (five) minutes as needed for chest pain (up to 3 doses. If taking 3rd dose call 911). 25 tablet 3  . oxymetazoline (AFRIN) 0.05 % nasal spray Place 2 sprays into left nostril every 12 (twelve) hours for 3 days. 15 mL 0  . prednisoLONE acetate (PRED FORTE) 1 % ophthalmic suspension SHAKE LQ AND INT 1 GTT IN OU BID FOR 10 DAYS  0  . ticagrelor (BRILINTA) 90 MG TABS tablet Take 1 tablet (90 mg total) by mouth 2 (two) times daily. 60 tablet 11  . tiZANidine (ZANAFLEX) 4 MG tablet Take 1 tablet by mouth at bedtime as needed for muscle spasm/sleep. 14 tablet 0  . traMADol (ULTRAM) 50 MG tablet Take 1 tablet (50 mg total) by mouth every 6 (six) hours as needed. 20 tablet 0   No current facility-administered medications on file prior to visit.     Review of Systems  Constitutional: Positive for fatigue. Negative for activity change, appetite change, fever and unexpected weight change.  HENT: Positive for nosebleeds. Negative for congestion, ear pain, rhinorrhea, sinus pressure and sore throat.   Eyes: Negative for pain, redness and visual disturbance.  Respiratory: Negative for cough, shortness of breath and wheezing.   Cardiovascular: Negative for chest pain, palpitations and leg swelling.  Gastrointestinal: Negative for abdominal pain, blood in stool, constipation and diarrhea.  Endocrine: Negative for polydipsia and  polyuria.  Genitourinary: Negative for dysuria, frequency and urgency.  Musculoskeletal: Negative for arthralgias, back pain and myalgias.       Left middle trigger finger  Skin: Negative for pallor and rash.  Allergic/Immunologic: Negative for environmental allergies.  Neurological: Negative for dizziness, syncope and headaches.       Tingling in R arm on and off   Hematological: Negative for adenopathy. Does not bruise/bleed easily.  Psychiatric/Behavioral: Negative for decreased concentration and dysphoric mood. The patient is not nervous/anxious.        Stressors        Objective:   Physical Exam Constitutional:      General: She is not in acute distress.    Appearance: Normal appearance. She is well-developed. She is not ill-appearing.     Comments: Overweight   HENT:     Head: Normocephalic and atraumatic.     Right Ear: Tympanic membrane, ear canal and external ear normal.     Left Ear: Tympanic membrane, ear canal and external ear normal.     Mouth/Throat:     Mouth: Mucous membranes are moist.     Pharynx: Oropharynx is clear.  Eyes:     General: No scleral icterus.    Conjunctiva/sclera: Conjunctivae normal.     Pupils: Pupils are  equal, round, and reactive to light.  Neck:     Musculoskeletal: Normal range of motion and neck supple.     Thyroid: No thyromegaly.     Vascular: No carotid bruit or JVD.  Cardiovascular:     Rate and Rhythm: Normal rate and regular rhythm.     Heart sounds: Normal heart sounds. No gallop.   Pulmonary:     Effort: Pulmonary effort is normal. No respiratory distress.     Breath sounds: Normal breath sounds. No wheezing or rales.  Chest:     Chest wall: No tenderness.  Abdominal:     General: Bowel sounds are normal. There is no distension or abdominal bruit.     Palpations: Abdomen is soft. There is no mass.     Tenderness: There is no abdominal tenderness.  Genitourinary:    Comments: Breast exam: No mass, nodules, thickening,  tenderness, bulging, retraction, inflamation, nipple discharge or skin changes noted.  No axillary or clavicular LA.     Musculoskeletal: Normal range of motion.        General: No tenderness.     Right lower leg: No edema.     Left lower leg: No edema.     Comments: No kyphosis  Lymphadenopathy:     Cervical: No cervical adenopathy.  Skin:    General: Skin is warm and dry.     Capillary Refill: Capillary refill takes less than 2 seconds.     Coloration: Skin is not pale.     Findings: No erythema or rash.     Comments: sks and solar lentigines diffusely with solar aging  Scars from recent skin cancer removals noted   Neurological:     Mental Status: She is alert. Mental status is at baseline.     Cranial Nerves: No cranial nerve deficit.     Motor: No abnormal muscle tone.     Coordination: Coordination normal.     Deep Tendon Reflexes: Reflexes are normal and symmetric. Reflexes normal.  Psychiatric:        Mood and Affect: Mood normal.           Assessment & Plan:   Problem List Items Addressed This Visit      Cardiovascular and Mediastinum   Essential hypertension    bp in fair control at this time  BP Readings from Last 1 Encounters:  09/13/18 112/64   No changes needed Most recent labs reviewed  Disc lifstyle change with low sodium diet and exercise        Type 2 diabetes mellitus with diabetic peripheral angiopathy without gangrene, without long-term current use of insulin (HCC)    Well controlled Lab Results  Component Value Date   HGBA1C 6.6 (H) 09/09/2018   Glipizide and metformin  Also ARB  Good foot and eye care  Intol to 4 statins and zetia -now in a study for new cholesterol medicine       Relevant Medications   glipiZIDE (GLUCOTROL XL) 10 MG 24 hr tablet   metFORMIN (GLUCOPHAGE-XR) 500 MG 24 hr tablet   PAF (paroxysmal atrial fibrillation) (HCC)    In nl rhythm today  No c/o  On brilinta         Endocrine   Hyperlipidemia associated  with type 2 diabetes mellitus ([place])    Disc goals for lipids and reasons to control them Rev last labs with pt Rev low sat fat diet in detail slt imp in LDL but not at goal Intol of  4 statins and zetia  Now in a study for new medication for lipids      Relevant Medications   glipiZIDE (GLUCOTROL XL) 10 MG 24 hr tablet   metFORMIN (GLUCOPHAGE-XR) 500 MG 24 hr tablet     Other   B12 deficiency    Lab Results  Component Value Date   VITAMINB12 428 09/09/2018  improved with oral daily B12        Tingling of upper extremity    Tingling of R arm on and off  No motor changes Went to ED and CT of head was normal ? If poss radicular  Will monitor and alert if this worsens or fails to improve      Obesity (BMI 30-39.9)    Discussed how this problem influences overall health and the risks it imposes  Reviewed plan for weight loss with lower calorie diet (via better food choices and also portion control or program like weight watchers) and exercise building up to or more than 30 minutes 5 days per week including some aerobic activity   Pt is not interested in exercise       Relevant Medications   glipiZIDE (GLUCOTROL XL) 10 MG 24 hr tablet   metFORMIN (GLUCOPHAGE-XR) 500 MG 24 hr tablet   Routine general medical examination at a health care facility    Reviewed health habits including diet and exercise and skin cancer prevention Reviewed appropriate screening tests for age  Also reviewed health mt list, fam hx and immunization status , as well as social and family history   See HPI Labs reviewed  Mammogram referral done  Pt will consider shingrix later on  dexa 5/13 normal No cognitive worries /changes Advance directive discussed- pt has paperwork to complete and plans to do it and bring a copy  Flu vaccine today        Herpes zoster    Rash is healed-still some discomfort  Planning to get on wt list for shingrix when symptoms improve further       Medicare annual  wellness visit, subsequent - Primary    Reviewed health habits including diet and exercise and skin cancer prevention Reviewed appropriate screening tests for age  Also reviewed health mt list, fam hx and immunization status , as well as social and family history   See HPI Labs reviewed  Mammogram referral done  Pt will consider shingrix later on  dexa 5/13 normal No cognitive worries /changes Advance directive discussed- pt has paperwork to complete and plans to do it and bring a copy  Flu vaccine today       Screening mammogram, encounter for    Scheduled annual screening mammogram Nl breast exam today  Encouraged monthly self exams        Relevant Orders   MM 3D SCREEN BREAST BILATERAL    Other Visit Diagnoses    CKD (chronic kidney disease), stage II       Need for influenza vaccination       Relevant Orders   Flu Vaccine QUAD 6+ mos PF IM (Fluarix Quad PF) (Completed)

## 2018-09-13 NOTE — Assessment & Plan Note (Signed)
In nl rhythm today  No c/o  On brilinta

## 2018-09-13 NOTE — Assessment & Plan Note (Signed)
Discussed how this problem influences overall health and the risks it imposes  Reviewed plan for weight loss with lower calorie diet (via better food choices and also portion control or program like weight watchers) and exercise building up to or more than 30 minutes 5 days per week including some aerobic activity   Pt is not interested in exercise

## 2018-09-13 NOTE — Assessment & Plan Note (Signed)
Scheduled annual screening mammogram Nl breast exam today  Encouraged monthly self exams   

## 2018-09-13 NOTE — Patient Instructions (Addendum)
Please work on your paperwork for the advance directive   Let us know if arm tingling worsens or if you develop weakness or chest pain let us know and get to the ER  Take care of yourself  Stay as active as you can   Flu shot today  We will schedule your mammogram at check out   Follow up in 6 months

## 2018-09-13 NOTE — Patient Outreach (Signed)
[facility]) Care Management  [place]   09/13/2018  Margaret Vazquez 1936-03-29 [id number]  [facility] Medication: losartan-hydrochlorothiazide 50-12.5 mg Current insurance:[employer]   Outreach:  Outreach call to Zandra Abts regarding her request for follow up from the [facility] Medication Adherence Campaign. No answer and no voicemail picks up.  Will close pharmacy episode.   Harlow Asa, PharmD, [place] [facility] [phone]

## 2018-09-13 NOTE — Telephone Encounter (Signed)
I went to give pt a flu shot after her OV with DR. Tower, she saw on her check out papers that it said she has the diagnosis of CKD (chronic kidney disease), stage II, she said no one has ever told her she has kidney and the fact that she is in stage 2 scares her, pt would like Dr. Glori Bickers if possible to call her and discuss this since she was unaware of this diagnosis

## 2018-09-13 NOTE — Telephone Encounter (Signed)
Pt notified of Dr. Tower's comments and verbalized understanding  

## 2018-09-13 NOTE — Assessment & Plan Note (Addendum)
Lab Results  Component Value Date   VITAMINB12 428 09/09/2018  improved with oral daily B12

## 2018-09-13 NOTE — Assessment & Plan Note (Signed)
Well controlled Lab Results  Component Value Date   HGBA1C 6.6 (H) 09/09/2018   Glipizide and metformin  Also ARB  Good foot and eye care  Intol to 4 statins and zetia -now in a study for new cholesterol medicine

## 2018-09-14 NOTE — Progress Notes (Signed)
Dr. Frederico Hamman T. Kayron Hicklin, MD, [facility] and [facility] Medicine [place], [address] Phone: [phone] Fax: [phone]  09/15/2018  Patient: Margaret Vazquez, MRN: [id number], DOB: 01-15-1936, 83 y.o.  Primary Physician:  Tower, [name], MD   Chief Complaint  Patient presents with  . Trigger finger    Left Middle   Subjective:   Margaret Vazquez is a 83 y.o. very pleasant female patient who presents with the following:  Pleasant 83 year old who presents with a trigger finger and pain.  She is a very nice lady, and she presents with volar pain and swelling on the third digit of the left hand.  She is not had any accident or trauma she does have some triggering at the A1 pulley.  She does have a small nodule.  She does have tenderness and some modest swelling throughout the entirety of the volar tendon sheath  L middle finger  Past Medical History, Surgical History, Social History, Family History, Problem List, Medications, and Allergies have been reviewed and updated if relevant.  Patient Active Problem List   Diagnosis Date Noted  . Medicare annual wellness visit, subsequent 09/13/2018  . Screening mammogram, encounter for 09/13/2018  . Herpes zoster 08/10/2018  . Plantar wart, left foot 06/15/2018  . CAD (coronary artery disease) 12/24/2017  . Routine general medical examination at a health care facility 08/24/2016  . PAF (paroxysmal atrial fibrillation) ([place]) 04/07/2016  . Obesity (BMI 30-39.9) 08/23/2014  . Type 2 diabetes mellitus with diabetic peripheral angiopathy without gangrene, without long-term current use of insulin ([place]) 01/12/2014  . TIA (transient ischemic attack) 01/11/2014  . CVA (cerebral infarction) 01/11/2014  . LOOP Recorder LINQ 06/07/2013  . Fatigue 06/07/2013  . Sinusitis, chronic 05/16/2013  . H/O: CVA (cerebrovascular accident) 02/15/2013  . Tingling of upper extremity 02/14/2013  . Encounter for Medicare annual  wellness exam 10/27/2012  . Encopresis(307.7) 12/15/2011  . Post-menopausal 10/28/2011  . PERSONAL HX COLONIC POLYPS 09/04/2009  . B12 deficiency 03/06/2009  . ALLERGIC RHINITIS 02/14/2008  . BACK PAIN, LUMBAR 11/03/2007  . Hyperlipidemia associated with type 2 diabetes mellitus ([place]) 11/24/2006  . Essential hypertension 11/24/2006  . GERD 10/09/2006  . OVERACTIVE BLADDER 10/09/2006  . INCONTINENCE, URGE 10/09/2006  . SKIN CANCER, HX OF 10/09/2006    Past Medical History:  Diagnosis Date  . Allergy history, drug    Aspirin  . Basal cell carcinoma    "back"  . CAD (coronary artery disease)    a. Previously nonobstructive then progressive angina with abnl CT -> cardiac cath 12/24/17 showed 30% mid RCA and 80% prox-mid Cx. She received DES to mid AV groove Cx. EF 55-65%.   . Cervical stenosis of spine    With neck pain  . CKD (chronic kidney disease), stage II   . Colon polyps 2009  . Complication of anesthesia 1980s   slow to wake after anesthesia "when I had breast biopsy"  . GERD (gastroesophageal reflux disease)   . Hyperlipidemia    myalgias with Lipitor and Zetia  . Hypertension   . Migraine    "stopped at age 66" (12/24/2017)  . Squamous carcinoma    "iced off and cut off; mostly arms" (12/24/2017)  . Stroke [facility])    "told me I'd had 2 strokes in 02/2013"; denies residual on 12/24/2017  . TIA (transient ischemic attack)   . Type II diabetes mellitus ([place])     Past Surgical History:  Procedure Laterality Date  . ABD [place]  07/2003   Negative  . APPENDECTOMY    . BASAL CELL CARCINOMA EXCISION     "back"  . BREAST BIOPSY Left 1992   benign  . CARDIAC CATHETERIZATION  04/07/2011   non obst CAD (Dr Burt Knack)  . CATARACT EXTRACTION W/PHACO Left 04/01/2017   Procedure: CATARACT EXTRACTION PHACO AND INTRAOCULAR LENS PLACEMENT ([place]) LEFT DIABETIC;  Surgeon: Leandrew Koyanagi, MD;  Location: [facility];  Service: Ophthalmology;  Laterality: Left;  Diabetic - oral meds   . CATARACT EXTRACTION W/PHACO Right 04/29/2017   Procedure: CATARACT EXTRACTION PHACO AND INTRAOCULAR LENS PLACEMENT ([place]) RIGHT DIABETIC;  Surgeon: Leandrew Koyanagi, MD;  Location: [facility];  Service: Ophthalmology;  Laterality: Right;  Diabetic - oral meds  . COLONOSCOPY  12/2007   Adenomatous colon polyps  . CORONARY ANGIOPLASTY WITH STENT PLACEMENT  12/24/2017  . CORONARY STENT INTERVENTION N/A 12/24/2017   Procedure: CORONARY STENT INTERVENTION;  Surgeon: Lorretta Harp, MD;  Location: [facility] CV LAB;  Service: Cardiovascular;  Laterality: N/A;  . CYSTOSCOPY W/ DECANNULATION  03/2000   Normal  . LEFT HEART CATH AND CORONARY ANGIOGRAPHY N/A 12/24/2017   Procedure: LEFT HEART CATH AND CORONARY ANGIOGRAPHY;  Surgeon: Lorretta Harp, MD;  Location: [facility] CV LAB;  Service: Cardiovascular;  Laterality: N/A;  . LOOP RECORDER IMPLANT N/A 02/16/2013   Procedure: LOOP RECORDER IMPLANT;  Surgeon: Deboraha Sprang, MD;  Location: [facility] CATH LAB;  Service: Cardiovascular;  Laterality: N/A;  . NASAL SINUS SURGERY  01/2005  . SQUAMOUS CELL CARCINOMA EXCISION     "mostly arms;" (12/24/2017)  . STRESS CARDIOLITE  11/1999   Normal/ negative  . TEAR DUCT PROBING  2005   "? side"  . TEE WITHOUT CARDIOVERSION N/A 02/16/2013   Procedure: TRANSESOPHAGEAL ECHOCARDIOGRAM (TEE);  Surgeon: Josue Hector, MD;  Location: [facility] ENDOSCOPY;  Service: Cardiovascular;  Laterality: N/A;  . TUBAL LIGATION     BTL    Social History   Socioeconomic History  . Marital status: Married    Spouse name: Eddie Dibbles  . Number of children: 3  . Years of education: 54  . Highest education level: Not on file  Occupational History  . Occupation: Retired    Fish farm manager: RETIRED  Social Needs  . Financial resource strain: Not on file  . Food insecurity:    Worry: Not on file    Inability: Not on file  . Transportation needs:    Medical: Not on file    Non-medical: Not on file  Tobacco Use  . Smoking  status: Never Smoker  . Smokeless tobacco: Never Used  Substance and Sexual Activity  . Alcohol use: Never    Alcohol/week: 0.0 standard drinks    Frequency: Never  . Drug use: Never  . Sexual activity: Not Currently  Lifestyle  . Physical activity:    Days per week: Not on file    Minutes per session: Not on file  . Stress: Not on file  Relationships  . Social connections:    Talks on phone: Not on file    Gets together: Not on file    Attends religious service: Not on file    Active member of club or organization: Not on file    Attends meetings of clubs or organizations: Not on file    Relationship status: Not on file  . Intimate partner violence:    Fear of current or ex partner: Not on file  Emotionally abused: Not on file    Physically abused: Not on file    Forced sexual activity: Not on file  Other Topics Concern  . Not on file  Social History Narrative   3 children   Does not drink caffeinated beverages   Cares for SIL with dementia    Family History  Problem Relation Age of Onset  . Lung cancer Brother   . Diabetes Brother   . Pancreatic cancer Brother   . Heart disease Mother   . Heart disease Father   . Brain cancer Other   . Skin cancer Daughter   . Diabetes Sister   . Colon cancer Neg Hx     Allergies  Allergen Reactions  . Bee Venom Hives, Shortness Of Breath and Swelling  . Nabumetone Anaphylaxis  . Amoxicillin-Pot Clavulanate Hives and Swelling    To lips.  . Aspirin Hives  . Atorvastatin Swelling     joint pain/swelling, inc liver tests  . Clopidogrel Bisulfate Hives  . Codeine Nausea And Vomiting  . Ezetimibe Other (See Comments)     fatigue  . Metformin And Related Other (See Comments)    Diarrhea   . Valsartan Other (See Comments)     fatigue  . Other Hives    **Red Meat**  SOB    Medication list reviewed and updated in full in [place].  GEN: No fevers, chills. Nontoxic. Primarily MSK c/o today. MSK: Detailed in  the HPI GI: tolerating PO intake without difficulty Neuro: No numbness, parasthesias, or tingling associated. Otherwise the pertinent positives of the ROS are noted above.   Objective:   BP 110/60   Pulse 60   Temp 98.2 F (36.8 C) (Oral)   Ht 5' 8.25" (1.734 m)   Wt 191 lb 12 oz (87 kg)   BMI 28.94 kg/m    GEN: WDWN, NAD, Non-toxic, Alert & Oriented x 3 HEENT: Atraumatic, Normocephalic.  Ears and Nose: No external deformity. EXTR: No clubbing/cyanosis/edema NEURO: Normal gait.  PSYCH: Normally interactive. Conversant. Not depressed or anxious appearing.  Calm demeanor.    Left third digit does appear modestly swollen dorsally as well as volarly.  She has tenderness throughout the flexor tendon sheath and a small nodule distal to the first A1 pulley and some obvious triggering.  No other significant bony tenderness in the hand or wrist.  Radiology:  Assessment and Plan:   Acquired trigger finger  Tenosynovitis of finger  Acute flexor tenosynovitis of the third digit on the left hand along with third digit trigger finger ring on the left.  We discussed the pathophysiology of trigger fingers. Discussed the inflammatory nature of nodule creation and likely nodule abutting the A1 pulley system, this causing the patient's discomfort and sensations. We discussed that treatments for this include direct injection into the tendon sheath to attempt to shrink catching tissue. This can be done 1-2 times. Other treatments include surgical release. If the patient fails to trigger finger injections, I would recommend trigger finger release if the patient desires relief of the symptoms.   Tendon Sheath Injection Procedure Note Margaret Vazquez 15-Nov-1935 Date of procedure: 09/15/2018  Procedure: Tendon Sheath Injection for Trigger Finger and flexor tenosynovitis, L 3rd Indications: Pain  Procedure Details Verbal consent was obtained. Risks (including rare risk of infection, potential risk  for skin lightening and potential atrophy), benefits and alternatives were discussed. Prepped with Chloraprep and Ethyl Chloride used for anesthesia. Under sterile conditions, patient injected at palmar crease  aiming distally with 45 degree angle towards nodule; injected directly into tendon sheath. Medication flowed freely without resistance.  Needle size: 22 gauge 1 1/2 inch Injection: 1/2 cc of Lidocaine 1% and Depo-Medrol 20 mg Medication: Depo-Medrol 20 mg   Follow-up: prn   Signed,  Britain Anagnos T. Lashon Hillier, MD   Outpatient Encounter Medications as of 09/15/2018  Medication Sig  . diphenhydrAMINE (BENADRYL) 25 MG tablet Take 50 mg by mouth every 8 (eight) hours as needed for allergies.   Marland Kitchen EPINEPHrine (EPI-PEN) 0.3 mg/0.3 mL DEVI Inject 0.3 mg into the muscle daily as needed (allergic reaction).   Marland Kitchen erythromycin ophthalmic ointment   . fluticasone (FLONASE) 50 MCG/ACT nasal spray Place 2 sprays into both nostrils 2 (two) times daily as needed. Reported on 09/10/2015  . glipiZIDE (GLUCOTROL XL) 10 MG 24 hr tablet Take 1 tablet (10 mg total) by mouth daily with breakfast.  . losartan-hydrochlorothiazide (HYZAAR) 50-12.5 MG tablet Take 1 tablet by mouth daily.   . metFORMIN (GLUCOPHAGE-XR) 500 MG 24 hr tablet TAKE 1 TABLET(500 MG) BY MOUTH DAILY WITH BREAKFAST  . nitroGLYCERIN (NITROSTAT) 0.4 MG SL tablet Place 1 tablet (0.4 mg total) under the tongue every 5 (five) minutes as needed for chest pain (up to 3 doses. If taking 3rd dose call 911).  Marland Kitchen oxymetazoline (AFRIN) 0.05 % nasal spray Place 2 sprays into left nostril every 12 (twelve) hours for 3 days.  . potassium chloride SA (K-DUR,KLOR-CON) 20 MEQ tablet Take 1 tablet (20 mEq total) by mouth daily.  . prednisoLONE acetate (PRED FORTE) 1 % ophthalmic suspension SHAKE LQ AND INT 1 GTT IN OU BID FOR 10 DAYS  . ticagrelor (BRILINTA) 90 MG TABS tablet Take 1 tablet (90 mg total) by mouth 2 (two) times daily.  Marland Kitchen tiZANidine (ZANAFLEX) 4 MG tablet  Take 1 tablet by mouth at bedtime as needed for muscle spasm/sleep.  . traMADol (ULTRAM) 50 MG tablet Take 1 tablet (50 mg total) by mouth every 6 (six) hours as needed.   No facility-administered encounter medications on file as of 09/15/2018.

## 2018-09-15 ENCOUNTER — Encounter: Payer: Self-pay | Admitting: *Deleted

## 2018-09-15 ENCOUNTER — Ambulatory Visit (INDEPENDENT_AMBULATORY_CARE_PROVIDER_SITE_OTHER): Payer: HMO | Admitting: Family Medicine

## 2018-09-15 ENCOUNTER — Encounter: Payer: Self-pay | Admitting: Family Medicine

## 2018-09-15 ENCOUNTER — Other Ambulatory Visit: Payer: Self-pay

## 2018-09-15 VITALS — BP 110/60 | HR 60 | Temp 98.2°F | Ht 68.25 in | Wt 191.8 lb

## 2018-09-15 DIAGNOSIS — M659 Synovitis and tenosynovitis, unspecified: Secondary | ICD-10-CM

## 2018-09-15 DIAGNOSIS — M6530 Trigger finger, unspecified finger: Secondary | ICD-10-CM | POA: Diagnosis not present

## 2018-09-15 DIAGNOSIS — Z006 Encounter for examination for normal comparison and control in clinical research program: Secondary | ICD-10-CM

## 2018-09-15 MED ORDER — METHYLPREDNISOLONE ACETATE 40 MG/ML IJ SUSP
20.0000 mg | Freq: Once | INTRAMUSCULAR | Status: AC
  Administered 2018-09-15: 20 mg via INTRA_ARTICULAR

## 2018-09-15 NOTE — Addendum Note (Signed)
Addended by: Carter Kitten on: 09/15/2018 02:36 PM   Modules accepted: Orders

## 2018-09-17 ENCOUNTER — Other Ambulatory Visit: Payer: Self-pay

## 2018-09-17 ENCOUNTER — Telehealth: Payer: Self-pay | Admitting: Internal Medicine

## 2018-09-17 NOTE — Patient Outreach (Signed)
  [facility] Management Chronic Special Needs Program   09/17/2018  Name: Margaret Vazquez, DOB: 01-Jan-1936  MRN: [id number]  The client was discussed in 09/15/18's interdisciplinary care team meeting. The client's individualized care plan was developed based on completed Health Risk Assessment  The following issues were discussed:  Changes in health status and Key risk triggers/risk stratification  Participants present:   Mahlon Gammon, MSN, RN, CCM, CNS    Thea Silversmith, MSN, RN, CCM   Kainalu Heggs RN,BSN,CCM, CDE       Recommendations: send Advanced Directives packet, educational materials HTN, heart disease, stroke  Plan:  Plan to send copy of individualized care plan to client Plan to send individualized care plan to provider. Chronic Care Management Coordinator will outreach in 2-4 months  Follow-up:  2-4 months  Peter Garter RN, Jackquline Denmark, CDE Chronic Care Management Coordinator [facility] [phone]

## 2018-09-17 NOTE — Telephone Encounter (Signed)
ANESTHESIA: LOCAL  NO MEDS NEED TO BE HELD INCLUDING WARFARIN  FAX # [phone]

## 2018-09-17 NOTE — Telephone Encounter (Signed)
   Primary Cardiologist: Virl Axe, MD  Chart reviewed as part of pre-operative protocol coverage. Given past medical history and time since last visit, based on ACC/AHA guidelines, Margaret Vazquez would be at acceptable risk for the planned procedure without further cardiovascular testing.   Pt must remain on Brilinta due to coronary stent placement 12/2017. Per requesting provider, no cardiac meds are required to be held. Pt may proceed.   I will route this recommendation to the requesting party via Epic fax function and remove from pre-op pool.  Please call with questions.  Lyda Jester, PA-C 09/17/2018, 1:31 PM

## 2018-09-17 NOTE — Telephone Encounter (Signed)
°  ° °  [facility] Pre-operative Risk Assessment    Request for surgical clearance:  1. What type of surgery is being performed? Removal of squamous cells on wrist  2. When is this surgery scheduled? TBD  3. What type of clearance is required (medical clearance vs. Pharmacy clearance to hold med vs. Both)? Both   4. Are there any medications that need to be held prior to surgery and how long? no  5. Practice name and name of physician performing surgery? Dr. Kirkland Hun, [facility]  6. What is your office phone number   7.   What is your office fax number  8.   Anesthesia type (None, local, MAC, general) ? no  Pt went to dermatologist today and has 3 spots of squamous cells that need to be removed on her wrist. 2 can be scraped, but one needs dug into. Pt wants to make sure it is safe for her to have this done. Leave message  if pt can not pick up the phone    Margaret Vazquez 09/17/2018, 11:59 AM  _________________________________________________________________

## 2018-09-17 NOTE — Telephone Encounter (Addendum)
I called and left message for Dr. Doroteo Bradford office needing to confirm if Margaret Vazquez is needing to be held as well as needing fax # so that we may route clearance once cleared. Left message to please call back [phone] and ask for [facility]. I goggled to find the phone number for Dr. Doroteo Bradford office which is [phone].   Amy from Dr. Doroteo Bradford office called back and said she was confused because they did not ask for surgery clearance. Amy states they have removed numerous times squamous cells on the pt w/o needing cardiac clearance. I explained to Amy now that the operator started a pre op clearance form I will need to complete. I explained to her that our office has a protocol for pre op which one is pt needs to be seen within 6 months which pt was seen 07/06/18. Amy understands and said pt will not need to hold any meds including warfarin, anesthesia will be local and fax # is [phone]. I thanked Amy for her help.

## 2018-09-27 DIAGNOSIS — D0462 Carcinoma in situ of skin of left upper limb, including shoulder: Secondary | ICD-10-CM | POA: Diagnosis not present

## 2018-09-27 DIAGNOSIS — C44629 Squamous cell carcinoma of skin of left upper limb, including shoulder: Secondary | ICD-10-CM | POA: Diagnosis not present

## 2018-09-27 DIAGNOSIS — C44619 Basal cell carcinoma of skin of left upper limb, including shoulder: Secondary | ICD-10-CM | POA: Diagnosis not present

## 2018-10-01 NOTE — Research (Signed)
Subject to research clinic for randomization visit in the [employer] research trial.  Next appointment scheduled

## 2018-10-14 ENCOUNTER — Ambulatory Visit: Payer: HMO

## 2018-10-25 ENCOUNTER — Telehealth: Payer: Self-pay | Admitting: Family Medicine

## 2018-10-25 DIAGNOSIS — E1151 Type 2 diabetes mellitus with diabetic peripheral angiopathy without gangrene: Secondary | ICD-10-CM

## 2018-10-25 MED ORDER — ONETOUCH ULTRA 2 W/DEVICE KIT: PACK | 0 refills | Status: AC

## 2018-10-25 NOTE — Telephone Encounter (Signed)
Done and in the IN box to fax Thanks

## 2018-10-25 NOTE — Telephone Encounter (Signed)
Pt called and said her glucose one touch meter broke over the weekend. She is requesting a script for a new unit to be sent to [facility] on [address].  Current unit- 1 touch ultra 2

## 2018-10-25 NOTE — Telephone Encounter (Signed)
Rx sent electronically.  

## 2018-11-12 DIAGNOSIS — T1512XA Foreign body in conjunctival sac, left eye, initial encounter: Secondary | ICD-10-CM | POA: Diagnosis not present

## 2018-11-17 ENCOUNTER — Ambulatory Visit (INDEPENDENT_AMBULATORY_CARE_PROVIDER_SITE_OTHER): Payer: HMO | Admitting: Family Medicine

## 2018-11-17 ENCOUNTER — Encounter: Payer: Self-pay | Admitting: Family Medicine

## 2018-11-17 ENCOUNTER — Other Ambulatory Visit: Payer: Self-pay

## 2018-11-17 VITALS — BP 124/60 | HR 78 | Temp 98.2°F | Ht 68.25 in | Wt 194.6 lb

## 2018-11-17 DIAGNOSIS — R040 Epistaxis: Secondary | ICD-10-CM | POA: Diagnosis not present

## 2018-11-17 DIAGNOSIS — N6322 Unspecified lump in the left breast, upper inner quadrant: Secondary | ICD-10-CM

## 2018-11-17 NOTE — Assessment & Plan Note (Signed)
Palpated a linear firm and mobile area about 1-2 cm and non tender fam hx of breast cancer in sister (in her 62s) Pt herself has had b9 bx in the past  Rest of exam is normal  Ordered bilat diag mm and [place]  Pending results Enc to continue self exams

## 2018-11-17 NOTE — Assessment & Plan Note (Signed)
In pt on Brilinta who bleeds easily  Original episode sent her to ED Reviewed hospital records, lab results and studies in detail   This was resolved with afrin ns (which as also px to her) Since then L nostril occ drips blood/has a clot -resolved with a spray of afrin  No headache or other symptoms  She does use steroid ns daily (flonase)  No obv source of bleeding seen on exam but septum is hyperemic  Plan to hold flonase and saline a week (? If making it hard to heal)  Use tiny bit of vaseline in nostril at night  If no improvement may need further eval by ENT (would like to avoid cauterization if possible)  inst to call if worse or no imp  inst to hold pressure firmly for 15 minutes if a nose bleed does not stop

## 2018-11-17 NOTE — Progress Notes (Signed)
Subjective:    Patient ID: Margaret Vazquez, female    DOB: 11/19/1935, 83 y.o.   MRN: [id number]  HPI Here for lump in breast and nosebleed problem   Wt Readings from Last 3 Encounters:  11/17/18 194 lb 9 oz (88.3 kg)  09/15/18 191 lb 12 oz (87 kg)  09/13/18 189 lb 7 oz (85.9 kg)   29.37 kg/m   Last mammogram was 3/19  Had to re schedule mammogram in June  She had b9 excisional bx years ago L  Lump is in the R breast H/o dense breast tissue  Not painful  Like a little pea  Noticed it a week ago  No skin change No nipple d/c   Sister had breast cancer in 60s   Has a clot in L nostril- recurrent Had to go to the ED in march  She was given afrin nasal spray  occ blood drips out- mild and resolved with a spray of afrin  Not a heavy flow like the first time  She notes and occ blood clot  She also takes Brilinta   Patient Active Problem List   Diagnosis Date Noted  . Breast lump on left side at 10 o'clock position 11/17/2018  . Left-sided epistaxis 11/17/2018  . Medicare annual wellness visit, subsequent 09/13/2018  . Screening mammogram, encounter for 09/13/2018  . Herpes zoster 08/10/2018  . Plantar wart, left foot 06/15/2018  . CAD (coronary artery disease) 12/24/2017  . Routine general medical examination at a health care facility 08/24/2016  . PAF (paroxysmal atrial fibrillation) ([place] 04/07/2016  . Obesity (BMI 30-39.9) 08/23/2014  . Type 2 diabetes mellitus with diabetic peripheral angiopathy without gangrene, without long-term current use of insulin ([place]) 01/12/2014  . TIA (transient ischemic attack) 01/11/2014  . CVA (cerebral infarction) 01/11/2014  . LOOP Recorder LINQ 06/07/2013  . Fatigue 06/07/2013  . Sinusitis, chronic 05/16/2013  . H/O: CVA (cerebrovascular accident) 02/15/2013  . Tingling of upper extremity 02/14/2013  . Encounter for Medicare annual wellness exam 10/27/2012  . Encopresis(307.7) 12/15/2011  . Post-menopausal 10/28/2011  .  PERSONAL HX COLONIC POLYPS 09/04/2009  . B12 deficiency 03/06/2009  . ALLERGIC RHINITIS 02/14/2008  . BACK PAIN, LUMBAR 11/03/2007  . Hyperlipidemia associated with type 2 diabetes mellitus ([place]) 11/24/2006  . Essential hypertension 11/24/2006  . GERD 10/09/2006  . OVERACTIVE BLADDER 10/09/2006  . INCONTINENCE, URGE 10/09/2006  . SKIN CANCER, HX OF 10/09/2006   Past Medical History:  Diagnosis Date  . Allergy history, drug    Aspirin  . Basal cell carcinoma    "back"  . CAD (coronary artery disease)    a. Previously nonobstructive then progressive angina with abnl CT -> cardiac cath 12/24/17 showed 30% mid RCA and 80% prox-mid Cx. She received DES to mid AV groove Cx. EF 55-65%.   . Cervical stenosis of spine    With neck pain  . CKD (chronic kidney disease), stage II   . Colon polyps 2009  . Complication of anesthesia 1980s   slow to wake after anesthesia "when I had breast biopsy"  . GERD (gastroesophageal reflux disease)   . Hyperlipidemia    myalgias with Lipitor and Zetia  . Hypertension   . Migraine    "stopped at age 80" (12/24/2017)  . Squamous carcinoma    "iced off and cut off; mostly arms" (12/24/2017)  . Stroke [facility])    "told me I'd had 2 strokes in 02/2013"; denies residual on 12/24/2017  . TIA (transient ischemic  attack)   . Type II diabetes mellitus ([place])    Past Surgical History:  Procedure Laterality Date  . ABD [place]  07/2003   Negative  . APPENDECTOMY    . BASAL CELL CARCINOMA EXCISION     "back"  . BREAST BIOPSY Left 1992   benign  . CARDIAC CATHETERIZATION  04/07/2011   non obst CAD (Dr Burt Knack)  . CATARACT EXTRACTION W/PHACO Left 04/01/2017   Procedure: CATARACT EXTRACTION PHACO AND INTRAOCULAR LENS PLACEMENT ([place]) LEFT DIABETIC;  Surgeon: Leandrew Koyanagi, MD;  Location: [place];  Service: Ophthalmology;  Laterality: Left;  Diabetic - oral meds  . CATARACT EXTRACTION W/PHACO Right 04/29/2017   Procedure: CATARACT EXTRACTION PHACO AND  INTRAOCULAR LENS PLACEMENT ([place]) RIGHT DIABETIC;  Surgeon: Leandrew Koyanagi, MD;  Location: [place];  Service: Ophthalmology;  Laterality: Right;  Diabetic - oral meds  . COLONOSCOPY  12/2007   Adenomatous colon polyps  . CORONARY ANGIOPLASTY WITH STENT PLACEMENT  12/24/2017  . CORONARY STENT INTERVENTION N/A 12/24/2017   Procedure: CORONARY STENT INTERVENTION;  Surgeon: Lorretta Harp, MD;  Location: [place] CV LAB;  Service: Cardiovascular;  Laterality: N/A;  . CYSTOSCOPY W/ DECANNULATION  03/2000   Normal  . LEFT HEART CATH AND CORONARY ANGIOGRAPHY N/A 12/24/2017   Procedure: LEFT HEART CATH AND CORONARY ANGIOGRAPHY;  Surgeon: Lorretta Harp, MD;  Location: [place] CV LAB;  Service: Cardiovascular;  Laterality: N/A;  . LOOP RECORDER IMPLANT N/A 02/16/2013   Procedure: LOOP RECORDER IMPLANT;  Surgeon: Deboraha Sprang, MD;  Location: [facility] CATH LAB;  Service: Cardiovascular;  Laterality: N/A;  . NASAL SINUS SURGERY  01/2005  . SQUAMOUS CELL CARCINOMA EXCISION     "mostly arms;" (12/24/2017)  . STRESS CARDIOLITE  11/1999   Normal/ negative  . TEAR DUCT PROBING  2005   "? side"  . TEE WITHOUT CARDIOVERSION N/A 02/16/2013   Procedure: TRANSESOPHAGEAL ECHOCARDIOGRAM (TEE);  Surgeon: Josue Hector, MD;  [facility] ENDOSCOPY;  Service: Cardiovascular;  Laterality: N/A;  . TUBAL LIGATION     BTL   Social History   Tobacco Use  . Smoking status: Never Smoker  . Smokeless tobacco: Never Used  Substance Use Topics  . Alcohol use: Never    Alcohol/week: 0.0 standard drinks    Frequency: Never  . Drug use: Never   Family History  Problem Relation Age of Onset  . Lung cancer Brother   . Diabetes Brother   . Pancreatic cancer Brother   . Heart disease Mother   . Heart disease Father   . Brain cancer Other   . Skin cancer Daughter   . Diabetes Sister   . Colon cancer Neg Hx    Allergies  Allergen Reactions  . Bee Venom Hives, Shortness Of Breath and Swelling   . Nabumetone Anaphylaxis  . Amoxicillin-Pot Clavulanate Hives and Swelling    To lips.  . Aspirin Hives  . Atorvastatin Swelling     joint pain/swelling, inc liver tests  . Clopidogrel Bisulfate Hives  . Codeine Nausea And Vomiting  . Ezetimibe Other (See Comments)     fatigue  . Metformin And Related Other (See Comments)    Diarrhea   . Valsartan Other (See Comments)     fatigue  . Other Hives    **Red Meat**  SOB   Current Outpatient Medications on File Prior to Visit  Medication Sig Dispense Refill  . Blood Glucose Monitoring Suppl (ONE TOUCH ULTRA 2) w/Device KIT Use to  check blood sugar once daily 1 each 0  . diphenhydrAMINE (BENADRYL) 25 MG tablet Take 50 mg by mouth every 8 (eight) hours as needed for allergies.     Marland Kitchen EPINEPHrine (EPI-PEN) 0.3 mg/0.3 mL DEVI Inject 0.3 mg into the muscle daily as needed (allergic reaction).     Marland Kitchen erythromycin ophthalmic ointment     . fluticasone (FLONASE) 50 MCG/ACT nasal spray Place 2 sprays into both nostrils 2 (two) times daily as needed. Reported on 09/10/2015    . glipiZIDE (GLUCOTROL XL) 10 MG 24 hr tablet Take 1 tablet (10 mg total) by mouth daily with breakfast. 90 tablet 3  . losartan-hydrochlorothiazide (HYZAAR) 50-12.5 MG tablet Take 1 tablet by mouth daily.     . metFORMIN (GLUCOPHAGE-XR) 500 MG 24 hr tablet TAKE 1 TABLET(500 MG) BY MOUTH DAILY WITH BREAKFAST 90 tablet 3  . nitroGLYCERIN (NITROSTAT) 0.4 MG SL tablet Place 1 tablet (0.4 mg total) under the tongue every 5 (five) minutes as needed for chest pain (up to 3 doses. If taking 3rd dose call 911). 25 tablet 3  . potassium chloride SA (K-DUR,KLOR-CON) 20 MEQ tablet Take 1 tablet (20 mEq total) by mouth daily. 90 tablet 3  . prednisoLONE acetate (PRED FORTE) 1 % ophthalmic suspension SHAKE LQ AND INT 1 GTT IN OU BID FOR 10 DAYS  0  . ticagrelor (BRILINTA) 90 MG TABS tablet Take 1 tablet (90 mg total) by mouth 2 (two) times daily. 60 tablet 11   No current facility-administered  medications on file prior to visit.      Review of Systems  Constitutional: Negative for activity change, appetite change, chills, diaphoresis, fatigue, fever and unexpected weight change.  HENT: Positive for nosebleeds and postnasal drip. Negative for congestion, ear pain, facial swelling, rhinorrhea, sinus pressure and sore throat.   Eyes: Negative for pain, redness and visual disturbance.  Respiratory: Negative for cough, shortness of breath and wheezing.   Cardiovascular: Negative for chest pain and palpitations.  Gastrointestinal: Negative for abdominal pain, blood in stool, constipation and diarrhea.  Endocrine: Negative for polydipsia and polyuria.  Genitourinary: Negative for dysuria, frequency and urgency.       Breast lump  No pain   Musculoskeletal: Negative for arthralgias, back pain and myalgias.  Skin: Negative for pallor and rash.  Allergic/Immunologic: Negative for environmental allergies.  Neurological: Negative for dizziness, syncope and headaches.  Hematological: Negative for adenopathy. Does not bruise/bleed easily.  Psychiatric/Behavioral: Negative for decreased concentration and dysphoric mood. The patient is not nervous/anxious.        Objective:   Physical Exam Constitutional:      General: She is not in acute distress.    Appearance: Normal appearance. She is not ill-appearing or diaphoretic.     Comments: Overweight   HENT:     Head: Normocephalic and atraumatic.     Right Ear: External ear normal.     Left Ear: External ear normal.     Nose: No congestion or rhinorrhea.     Comments: L nostril has dried blood /no clot seen Septum is very hyperemic w/o active bleeding No wound noted  MM are not dry   No sinus tenderness     Mouth/Throat:     Mouth: Mucous membranes are moist.     Pharynx: Oropharynx is clear. No oropharyngeal exudate or posterior oropharyngeal erythema.  Eyes:     General: No scleral icterus.       Right eye: No discharge.  Left eye: No discharge.     Extraocular Movements: Extraocular movements intact.     Conjunctiva/sclera: Conjunctivae normal.     Pupils: Pupils are equal, round, and reactive to light.  Neck:     Musculoskeletal: Normal range of motion and neck supple. No muscular tenderness.     Vascular: No carotid bruit.  Cardiovascular:     Rate and Rhythm: Normal rate and regular rhythm.     Heart sounds: Normal heart sounds.  Pulmonary:     Effort: Pulmonary effort is normal. No respiratory distress.     Breath sounds: Normal breath sounds. No stridor. No wheezing.  Genitourinary:    Comments: L breast- 1-2 cm linear firm mobile density palpated at 10:00 position consistent with dense tissue No other M/lumps No skin change or nipple d/c Both breasts examined  Lymphadenopathy:     Cervical: No cervical adenopathy.  Skin:    General: Skin is warm and dry.     Capillary Refill: Capillary refill takes less than 2 seconds.     Coloration: Skin is not pale.     Findings: No erythema, lesion or rash.  Neurological:     Mental Status: She is alert. Mental status is at baseline.  Psychiatric:        Mood and Affect: Mood normal.           Assessment & Plan:   Problem List Items Addressed This Visit      Other   Breast lump on left side at 10 o'clock position - Primary    Palpated a linear firm and mobile area about 1-2 cm and non tender fam hx of breast cancer in sister (in her 79s) Pt herself has had b9 bx in the past  Rest of exam is normal  Ordered bilat diag mm and [place]  Pending results Enc to continue self exams       Relevant Orders   MM DIAG BREAST TOMO BILATERAL   US BREAST LTD UNI LEFT INC AXILLA   US BREAST LTD UNI RIGHT INC AXILLA   Left-sided epistaxis    In pt on Brilinta who bleeds easily  Original episode sent her to ED Reviewed hospital records, lab results and studies in detail   This was resolved with afrin ns (which as also px to her) Since then L nostril  occ drips blood/has a clot -resolved with a spray of afrin  No headache or other symptoms  She does use steroid ns daily (flonase)  No obv source of bleeding seen on exam but septum is hyperemic  Plan to hold flonase and saline a week (? If making it hard to heal)  Use tiny bit of vaseline in nostril at night  If no improvement may need further eval by ENT (would like to avoid cauterization if possible)  inst to call if worse or no imp  inst to hold pressure firmly for 15 minutes if a nose bleed does not stop

## 2018-11-17 NOTE — Patient Instructions (Addendum)
The office will call you to set up breast imaging   Stop flonase for a week to let area heal in your nose You can put a small amount of vaseline in your nostril at night  Use the afrin nasal spray as needed for bleeding If no improvement please let me know

## 2018-11-25 ENCOUNTER — Other Ambulatory Visit: Payer: Self-pay

## 2018-11-25 ENCOUNTER — Ambulatory Visit
Admission: RE | Admit: 2018-11-25 | Discharge: 2018-11-25 | Disposition: A | Discharge status: Home / Self Care | Payer: HMO | Source: Ambulatory Visit | Attending: Family Medicine | Admitting: Family Medicine

## 2018-11-25 ENCOUNTER — Ambulatory Visit: Payer: HMO

## 2018-11-25 ENCOUNTER — Telehealth: Payer: Self-pay | Admitting: Family Medicine

## 2018-11-25 DIAGNOSIS — N6322 Unspecified lump in the left breast, upper inner quadrant: Secondary | ICD-10-CM

## 2018-11-25 DIAGNOSIS — N6320 Unspecified lump in the left breast, unspecified quadrant: Secondary | ICD-10-CM | POA: Diagnosis not present

## 2018-11-25 NOTE — Telephone Encounter (Signed)
Aware, thanks!

## 2018-11-25 NOTE — Telephone Encounter (Signed)
Patient called to let you know she had her Mammogram today and the lump in her breast was from the Loop recorder that she still has under the skin. They never took the battery out so that is what it was, patient wanted you to know.

## 2018-12-03 ENCOUNTER — Ambulatory Visit: Payer: HMO

## 2018-12-08 ENCOUNTER — Other Ambulatory Visit: Payer: HMO

## 2018-12-10 ENCOUNTER — Telehealth: Payer: Self-pay | Admitting: *Deleted

## 2018-12-10 NOTE — Telephone Encounter (Signed)
Spoke with subject for visit 3 in the [place] [facility] research study.  No aes or saes to report. Unable to do clinic visit due to Covid 19 restrictions.  Will reschedule as soon as clinic opens up for face to face visits.

## 2018-12-13 ENCOUNTER — Other Ambulatory Visit: Payer: Self-pay

## 2018-12-13 ENCOUNTER — Ambulatory Visit (INDEPENDENT_AMBULATORY_CARE_PROVIDER_SITE_OTHER): Payer: HMO | Admitting: Family Medicine

## 2018-12-13 ENCOUNTER — Encounter: Payer: Self-pay | Admitting: Family Medicine

## 2018-12-13 VITALS — BP 110/70 | HR 58 | Temp 98.6°F | Ht 65.5 in | Wt 194.5 lb

## 2018-12-13 DIAGNOSIS — M79645 Pain in left finger(s): Secondary | ICD-10-CM | POA: Diagnosis not present

## 2018-12-13 DIAGNOSIS — M24542 Contracture, left hand: Secondary | ICD-10-CM

## 2018-12-13 DIAGNOSIS — M25442 Effusion, left hand: Secondary | ICD-10-CM | POA: Diagnosis not present

## 2018-12-13 DIAGNOSIS — M65332 Trigger finger, left middle finger: Secondary | ICD-10-CM | POA: Diagnosis not present

## 2018-12-13 NOTE — Progress Notes (Signed)
Airyana Sprunger T. Miguelina Fore, MD Primary [facility] and [facility] [place], [address] Phone: [phone]  FAX: [place] - 83 y.o. female  MRN [id number]  Date of Birth: 1935/09/21  Visit Date: 12/13/2018  PCP: Abner Greenspan, MD  Referred by: Tower, [name], MD  Chief Complaint  Patient presents with  . Trigger Finger    Left Middle   Subjective:   Margaret Vazquez is a 83 y.o. very pleasant female patient who presents with the following:  She is a well-known elderly lady, 83 years old, and I saw her 3 or 4 months ago when she was having some triggering on the left finger, third digit.  At that point I did a trigger finger injection, and she had some relief of symptoms decrease pain and decrease mechanical symptoms.  She presents today with acute pain, severe restriction of motion, notably significant swelling and skin discoloration and bluish texture in character.  It is tender to palpate throughout that finger and in the volar aspect of the hand.  She also has some restriction at the fourth digit as well.  Past Medical History, Surgical History, Social History, Family History, Problem List, Medications, and Allergies have been reviewed and updated if relevant.  Patient Active Problem List   Diagnosis Date Noted  . Breast lump on left side at 10 o'clock position 11/17/2018  . Left-sided epistaxis 11/17/2018  . Medicare annual wellness visit, subsequent 09/13/2018  . Screening mammogram, encounter for 09/13/2018  . Herpes zoster 08/10/2018  . Plantar wart, left foot 06/15/2018  . CAD (coronary artery disease) 12/24/2017  . Routine general medical examination at a health care facility 08/24/2016  . PAF (paroxysmal atrial fibrillation) ([place]) 04/07/2016  . Obesity (BMI 30-39.9) 08/23/2014  . Type 2 diabetes mellitus with diabetic peripheral angiopathy without gangrene, without long-term current use  of insulin ([place]) 01/12/2014  . TIA (transient ischemic attack) 01/11/2014  . CVA (cerebral infarction) 01/11/2014  . LOOP Recorder LINQ 06/07/2013  . Fatigue 06/07/2013  . Sinusitis, chronic 05/16/2013  . H/O: CVA (cerebrovascular accident) 02/15/2013  . Tingling of upper extremity 02/14/2013  . Encounter for Medicare annual wellness exam 10/27/2012  . Encopresis(307.7) 12/15/2011  . Post-menopausal 10/28/2011  . PERSONAL HX COLONIC POLYPS 09/04/2009  . B12 deficiency 03/06/2009  . ALLERGIC RHINITIS 02/14/2008  . BACK PAIN, LUMBAR 11/03/2007  . Hyperlipidemia associated with type 2 diabetes mellitus ([place]) 11/24/2006  . Essential hypertension 11/24/2006  . GERD 10/09/2006  . OVERACTIVE BLADDER 10/09/2006  . INCONTINENCE, URGE 10/09/2006  . SKIN CANCER, HX OF 10/09/2006    Past Medical History:  Diagnosis Date  . Allergy history, drug    Aspirin  . Basal cell carcinoma    "back"  . CAD (coronary artery disease)    a. Previously nonobstructive then progressive angina with abnl CT -> cardiac cath 12/24/17 showed 30% mid RCA and 80% prox-mid Cx. She received DES to mid AV groove Cx. EF 55-65%.   . Cervical stenosis of spine    With neck pain  . CKD (chronic kidney disease), stage II   . Colon polyps 2009  . Complication of anesthesia 1980s   slow to wake after anesthesia "when I had breast biopsy"  . GERD (gastroesophageal reflux disease)   . Hyperlipidemia    myalgias with Lipitor and Zetia  . Hypertension   . Migraine    "stopped at age 78" (12/24/2017)  .  Squamous carcinoma    "iced off and cut off; mostly arms" (12/24/2017)  . Stroke [facility])    "told me I'd had 2 strokes in 02/2013"; denies residual on 12/24/2017  . TIA (transient ischemic attack)   . Type II diabetes mellitus ([place])     Past Surgical History:  Procedure Laterality Date  . ABD [place]  07/2003   Negative  . APPENDECTOMY    . BASAL CELL CARCINOMA EXCISION     "back"  . BREAST BIOPSY Left 1992   benign  .  CARDIAC CATHETERIZATION  04/07/2011   non obst CAD (Dr Burt Knack)  . CATARACT EXTRACTION W/PHACO Left 04/01/2017   Procedure: CATARACT EXTRACTION PHACO AND INTRAOCULAR LENS PLACEMENT ([place]) LEFT DIABETIC;  Surgeon: Leandrew Koyanagi, MD;  Location: [facility];  Service: Ophthalmology;  Laterality: Left;  Diabetic - oral meds  . CATARACT EXTRACTION W/PHACO Right 04/29/2017   Procedure: CATARACT EXTRACTION PHACO AND INTRAOCULAR LENS PLACEMENT ([place]) RIGHT DIABETIC;  Surgeon: Leandrew Koyanagi, MD;  Location: [facility];  Service: Ophthalmology;  Laterality: Right;  Diabetic - oral meds  . COLONOSCOPY  12/2007   Adenomatous colon polyps  . CORONARY ANGIOPLASTY WITH STENT PLACEMENT  12/24/2017  . CORONARY STENT INTERVENTION N/A 12/24/2017   Procedure: CORONARY STENT INTERVENTION;  Surgeon: Lorretta Harp, MD;  Location: [place] CV LAB;  Service: Cardiovascular;  Laterality: N/A;  . CYSTOSCOPY W/ DECANNULATION  03/2000   Normal  . LEFT HEART CATH AND CORONARY ANGIOGRAPHY N/A 12/24/2017   Procedure: LEFT HEART CATH AND CORONARY ANGIOGRAPHY;  Surgeon: Lorretta Harp, MD;  Location: [place] CV LAB;  Service: Cardiovascular;  Laterality: N/A;  . LOOP RECORDER IMPLANT N/A 02/16/2013   Procedure: LOOP RECORDER IMPLANT;  Surgeon: Deboraha Sprang, MD;  Location: [facility] CATH LAB;  Service: Cardiovascular;  Laterality: N/A;  . NASAL SINUS SURGERY  01/2005  . SQUAMOUS CELL CARCINOMA EXCISION     "mostly arms;" (12/24/2017)  . STRESS CARDIOLITE  11/1999   Normal/ negative  . TEAR DUCT PROBING  2005   "? side"  . TEE WITHOUT CARDIOVERSION N/A 02/16/2013   Procedure: TRANSESOPHAGEAL ECHOCARDIOGRAM (TEE);  Surgeon: Josue Hector, MD;  Location: [facility] ENDOSCOPY;  Service: Cardiovascular;  Laterality: N/A;  . TUBAL LIGATION     BTL    Social History   Socioeconomic History  . Marital status: Married    Spouse name: Eddie Dibbles  . Number of children: 3  . Years of education: 73  . Highest  education level: Not on file  Occupational History  . Occupation: Retired    Fish farm manager: RETIRED  Social Needs  . Financial resource strain: Not on file  . Food insecurity:    Worry: Never true    Inability: Never true  . Transportation needs:    Medical: No    Non-medical: No  Tobacco Use  . Smoking status: Never Smoker  . Smokeless tobacco: Never Used  Substance and Sexual Activity  . Alcohol use: Never    Alcohol/week: 0.0 standard drinks    Frequency: Never  . Drug use: Never  . Sexual activity: Not Currently  Lifestyle  . Physical activity:    Days per week: Not on file    Minutes per session: Not on file  . Stress: Not on file  Relationships  . Social connections:    Talks on phone: Not on file    Gets together: Not on file    Attends religious service: Not on file  Active member of club or organization: Not on file    Attends meetings of clubs or organizations: Not on file    Relationship status: Not on file  . Intimate partner violence:    Fear of current or ex partner: Not on file    Emotionally abused: Not on file    Physically abused: Not on file    Forced sexual activity: Not on file  Other Topics Concern  . Not on file  Social History Narrative   3 children   Does not drink caffeinated beverages   Cares for SIL with dementia    Family History  Problem Relation Age of Onset  . Lung cancer Brother   . Diabetes Brother   . Pancreatic cancer Brother   . Heart disease Mother   . Heart disease Father   . Brain cancer Other   . Skin cancer Daughter   . Diabetes Sister   . Breast cancer Sister   . Colon cancer Neg Hx     Allergies  Allergen Reactions  . Bee Venom Hives, Shortness Of Breath and Swelling  . Nabumetone Anaphylaxis  . Amoxicillin-Pot Clavulanate Hives and Swelling    To lips.  . Aspirin Hives  . Atorvastatin Swelling     joint pain/swelling, inc liver tests  . Clopidogrel Bisulfate Hives  . Codeine Nausea And Vomiting  .  Ezetimibe Other (See Comments)     fatigue  . Metformin And Related Other (See Comments)    Diarrhea   . Valsartan Other (See Comments)     fatigue  . Other Hives    **Red Meat**  SOB    Medication list reviewed and updated in full in [place].  GEN: No fevers, chills. Nontoxic. Primarily MSK c/o today. MSK: Detailed in the HPI GI: tolerating PO intake without difficulty Neuro: No numbness, parasthesias, or tingling associated. Otherwise the pertinent positives of the ROS are noted above.   Objective:   BP 110/70   Pulse (!) 58   Temp 98.6 F (37 C) (Oral)   Ht 5' 5.5" (1.664 m)   Wt 194 lb 8 oz (88.2 kg)   SpO2 95%   BMI 31.87 kg/m    GEN: WDWN, NAD, Non-toxic, Alert & Oriented x 3 HEENT: Atraumatic, Normocephalic.  Ears and Nose: No external deformity. EXTR: No clubbing/cyanosis/edema NEURO: Normal gait.  PSYCH: Normally interactive. Conversant. Not depressed or anxious appearing.  Calm demeanor.    The left third finger is acutely swollen, has a bluish texture over and above her baseline birthmark, and it also has portions of the finger that are whitish in character.  It is acutely tender throughout the finger in all places and is acutely tender at the metacarpal head and in the volar aspect of the hand.  It does not appear to be acutely warm.  There is no fluctuance.  She is essentially unable to move this finger at all.  I tried to extend this finger multiple times and was unable to do so.  She also had some limitation of motion at the fourth digit as well with significantly decreased relative pain.  She is unable to make a full composite fist at all with her third and fourth finger, with her third finger having marked restriction of motion diffusely.  Radiology:  Assessment and Plan:   Contracture of finger joint, left - Plan: Ambulatory referral to Hand Surgery  Finger joint swelling, left - Plan: Ambulatory referral to Hand Surgery  Finger pain,  left - Plan: Ambulatory referral to Hand Surgery  Level of Medical Decision-Making in this case is Moderate.  Decidedly abnormal exam with acute diffuse pain, acute diffuse swelling, and unable to make a composite fist, unable to straighten the finger.  Diffusely tender throughout the entirety of the finger and the volar aspect of the metacarpal heads.  This is over and above an acquired trigger finger, and this needs assessment at a higher level of care.  We have made an appointment today to see Dr. Latanya Maudlin this afternoon, and I appreciate his help.  No orders of the defined types were placed in this encounter.  Orders Placed This Encounter  Procedures  . Ambulatory referral to Hand Surgery    Signed,  Frederico Hamman T. Sebrina Kessner, MD   Outpatient Encounter Medications as of 12/13/2018  Medication Sig  . Blood Glucose Monitoring Suppl (ONE TOUCH ULTRA 2) w/Device KIT Use to check blood sugar once daily  . diphenhydrAMINE (BENADRYL) 25 MG tablet Take 50 mg by mouth every 8 (eight) hours as needed for allergies.   Marland Kitchen EPINEPHrine (EPI-PEN) 0.3 mg/0.3 mL DEVI Inject 0.3 mg into the muscle daily as needed (allergic reaction).   Marland Kitchen erythromycin ophthalmic ointment   . fluticasone (FLONASE) 50 MCG/ACT nasal spray Place 2 sprays into both nostrils 2 (two) times daily as needed. Reported on 09/10/2015  . glipiZIDE (GLUCOTROL XL) 10 MG 24 hr tablet Take 1 tablet (10 mg total) by mouth daily with breakfast.  . losartan-hydrochlorothiazide (HYZAAR) 50-12.5 MG tablet Take 1 tablet by mouth daily.   . metFORMIN (GLUCOPHAGE-XR) 500 MG 24 hr tablet TAKE 1 TABLET(500 MG) BY MOUTH DAILY WITH BREAKFAST  . nitroGLYCERIN (NITROSTAT) 0.4 MG SL tablet Place 1 tablet (0.4 mg total) under the tongue every 5 (five) minutes as needed for chest pain (up to 3 doses. If taking 3rd dose call 911).  . potassium chloride SA (K-DUR,KLOR-CON) 20 MEQ tablet Take 1 tablet (20 mEq total) by mouth daily.  . prednisoLONE acetate (PRED  FORTE) 1 % ophthalmic suspension SHAKE LQ AND INT 1 GTT IN OU BID FOR 10 DAYS  . ticagrelor (BRILINTA) 90 MG TABS tablet Take 1 tablet (90 mg total) by mouth 2 (two) times daily.   No facility-administered encounter medications on file as of 12/13/2018.

## 2018-12-13 NOTE — Patient Instructions (Signed)
REFERRALS TO SPECIALISTS, SPECIAL TESTS (MRI, CT, ULTRASOUNDS)  [facility] or  [facility] will help you. ASK CHECK-IN FOR HELP.  Specialist appointment times vary a great deal, based on their schedule / openings. -- Some specialists have very long wait times. (Example. Dermatology)    

## 2018-12-14 ENCOUNTER — Telehealth: Payer: Self-pay | Admitting: *Deleted

## 2018-12-14 ENCOUNTER — Telehealth: Payer: Self-pay | Admitting: Internal Medicine

## 2018-12-14 NOTE — Telephone Encounter (Signed)
I spoke with Veronda Prude at [facility], she is ok with scheduling patient tomorrow at 2:15 with Jory Sims, NP. Patient is aware of appointment.

## 2018-12-14 NOTE — Telephone Encounter (Signed)
Okay thank you

## 2018-12-14 NOTE — Telephone Encounter (Signed)
° °  [facility] Pre-operative Risk Assessment    Request for surgical clearance:  1. What type of surgery is being performed? LEFT TRIGGER FINGER RELEASE  2. When is this surgery scheduled? 12/16/18  3. What type of clearance is required (medical clearance vs. Pharmacy clearance to hold med vs. Both)? Cardiac clearance  4. Are there any medications that need to be held prior to surgery and how long?n/a  5. Practice name and name of physician performing surgery? [facility] / Dr. Melrose Nakayama  6. What is your office phone number [phone]   7.   What is your office fax number [phone]  8.   Anesthesia type (None, local, MAC, general) ? IV regional w/ a block    Ermelinda Das 12/14/2018, 9:23 AM  _________________________________________________________________   (provider comments below)

## 2018-12-14 NOTE — Telephone Encounter (Signed)
   [facility] Pre-operative Risk Assessment    Request for surgical clearance:  1. What type of surgery is being performed? LT TRIGGER FINGER RELEASE  2. When is this surgery scheduled? 12/16/2018  3. What type of clearance is required (medical clearance vs. Pharmacy clearance to hold med vs. Both)? BOTH  4. Are there any medications that need to be held prior to surgery and how long? PENDING PROVIDER RECOMMENDATIONS   5. Practice name and name of physician performing surgery? [facility], Hessie Dibble, MD   6. What is your office phone number [phone]   7.   What is your office fax number [phone] ATTN: REBECCA LANG  8.   Anesthesia type (None, local, MAC, general) ? BIER BLOCK    Margaret Vazquez 12/14/2018, 6:21 PM  _________________________________________________________________   (provider comments below)

## 2018-12-14 NOTE — Telephone Encounter (Signed)
   Primary Cardiologist:Steven Caryl Comes, MD  Chart reviewed as part of pre-operative protocol coverage. Based on Margaret Vazquez's past medical history, time since last visit and current complaints of newly worsened shortness of breath going up and down her basement stairs and legs giving out with walking across her yard, although no chest pain/pressure, she will require a follow-up visit in order to better assess preoperative cardiovascular risk.  Her surgery is scheduled for 2 days from now and this may need to be postponed until she is evaluated. She understands.   Pre-op covering staff: - Please schedule appointment and call patient to inform them. - Please contact requesting surgeon's office via preferred method (i.e, phone, fax) to inform them of need for appointment prior to surgery.  If applicable, this message will also be routed to pharmacy pool and/or primary cardiologist for input on holding anticoagulant/antiplatelet agent as requested below so that this information is available at time of patient's appointment.   Daune Perch, NP  12/14/2018, 11:44 AM

## 2018-12-15 ENCOUNTER — Encounter: Payer: Self-pay | Admitting: Adult Health

## 2018-12-15 ENCOUNTER — Other Ambulatory Visit: Payer: Self-pay

## 2018-12-15 ENCOUNTER — Ambulatory Visit (INDEPENDENT_AMBULATORY_CARE_PROVIDER_SITE_OTHER): Payer: HMO | Admitting: Adult Health

## 2018-12-15 VITALS — BP 122/72 | HR 60 | Temp 97.2°F | Ht 69.0 in | Wt 194.2 lb

## 2018-12-15 DIAGNOSIS — E7800 Pure hypercholesterolemia, unspecified: Secondary | ICD-10-CM | POA: Diagnosis not present

## 2018-12-15 DIAGNOSIS — Z01810 Encounter for preprocedural cardiovascular examination: Secondary | ICD-10-CM

## 2018-12-15 DIAGNOSIS — I10 Essential (primary) hypertension: Secondary | ICD-10-CM

## 2018-12-15 DIAGNOSIS — I2510 Atherosclerotic heart disease of native coronary artery without angina pectoris: Secondary | ICD-10-CM

## 2018-12-15 NOTE — Telephone Encounter (Signed)
   Primary Cardiologist: Virl Axe, MD  Chart reviewed as part of pre-operative protocol coverage. Given past medical history and time since last visit, based on ACC/AHA guidelines, Margaret Vazquez would be at acceptable risk for the planned procedure without further cardiovascular testing.   Pt was seen by Jory Sims, NP, today for preop evaluation. Per documentation: 1.  Pre-Operative Cardiology Evaluation:   Based today's visit and based on ACC/AHA guidelines, Margaret Vazquez would be at acceptable risk for the planned procedure without further cardiovascular testing. She will hold Brilinta for next 24 hours.   I will route this recommendation to the requesting party via Epic fax function and remove from pre-op pool.  Please call with questions.  Lyda Jester, PA-C 12/15/2018, 3:04 PM

## 2018-12-15 NOTE — Progress Notes (Signed)
Cardiology Office Note   Date:  12/15/2018   ID:  Margaret Vazquez, DOB 1935/09/08, MRN [id number]  PCP:  Abner Greenspan, MD  Cardiologist:  Dr. Caryl Comes  CC: Pre-Operative Cardiology Evaluation    History of Present Illness: Margaret Vazquez is a 83 y.o. female who presents for ongoing assessment and management of CAD, PAF, Diabetes Type II, HDL intolerant to Lipitor and Zetia, hypertension, with hx of CVA s/p loop recorder.   She underwent coronary CT which showed potential obstructive disease in the proximal to mid LAD and also diagonal area with abnormal FFR. On the initial coronary CT, there was also mention of severe left circumflex disease, however FFR of this area was normal.She eventually underwent cardiac catheterization on 12/24/2017 which showed a 30% mid LAD lesion, 80% proximal to mid left circumflex lesion treated with DES, EF 55 to 65% on 12/23/2017.   She had been recommended for PCSK9  inhibitor due to intolerance of statins. She remained on Brilinta but is intolerant to ASA. She was mildly hypertensive on office visit was noted to have increased dizziness on higher doses of losartan/HCTZ and therefore no medication adjustments were made.    Of note, she has a history of ectropion of the eyelid, and was to wait for elective surgery for one year after having PCI/DES before planning surgery.  She is here for surgical clearance for different reason today. She is having trigger finger release by [employer], Dr. Rhona Raider on 12/16/2018.   She was seen last by Almyra Deforest, PA on 07/06/2018 and was doing well. She rarely used NTG, (only once with exertion). She has not had any further need to take NTG. She remains active. She is able to climb stairs from her basement without chest pain, but has a little dyspnea. She denies bleeding, dizziness, or fatigue.   Past Medical History:  Diagnosis Date  . Allergy history, drug    Aspirin  . Basal cell carcinoma    "back"  . CAD (coronary  artery disease)    a. Previously nonobstructive then progressive angina with abnl CT -> cardiac cath 12/24/17 showed 30% mid RCA and 80% prox-mid Cx. She received DES to mid AV groove Cx. EF 55-65%.   . Cervical stenosis of spine    With neck pain  . CKD (chronic kidney disease), stage II   . Colon polyps 2009  . Complication of anesthesia 1980s   slow to wake after anesthesia "when I had breast biopsy"  . GERD (gastroesophageal reflux disease)   . Hyperlipidemia    myalgias with Lipitor and Zetia  . Hypertension   . Migraine    "stopped at age 86" (12/24/2017)  . Squamous carcinoma    "iced off and cut off; mostly arms" (12/24/2017)  . Stroke [facility])    "told me I'd had 2 strokes in 02/2013"; denies residual on 12/24/2017  . TIA (transient ischemic attack)   . Type II diabetes mellitus ([place])     Past Surgical History:  Procedure Laterality Date  . ABD [place]  07/2003   Negative  . APPENDECTOMY    . BASAL CELL CARCINOMA EXCISION     "back"  . BREAST BIOPSY Left 1992   benign  . CARDIAC CATHETERIZATION  04/07/2011   non obst CAD (Dr Burt Knack)  . CATARACT EXTRACTION W/PHACO Left 04/01/2017   Procedure: CATARACT EXTRACTION PHACO AND INTRAOCULAR LENS PLACEMENT ([place]) LEFT DIABETIC;  Surgeon: Leandrew Koyanagi, MD;  Location: [place];  Service: Ophthalmology;  Laterality: Left;  Diabetic - oral meds  . CATARACT EXTRACTION W/PHACO Right 04/29/2017   Procedure: CATARACT EXTRACTION PHACO AND INTRAOCULAR LENS PLACEMENT ([place]) RIGHT DIABETIC;  Surgeon: Leandrew Koyanagi, MD;  Location: [place];  Service: Ophthalmology;  Laterality: Right;  Diabetic - oral meds  . COLONOSCOPY  12/2007   Adenomatous colon polyps  . CORONARY ANGIOPLASTY WITH STENT PLACEMENT  12/24/2017  . CORONARY STENT INTERVENTION N/A 12/24/2017   Procedure: CORONARY STENT INTERVENTION;  Surgeon: Lorretta Harp, MD;  Location: [place] CV LAB;  Service: Cardiovascular;  Laterality: N/A;  . CYSTOSCOPY  W/ DECANNULATION  03/2000   Normal  . LEFT HEART CATH AND CORONARY ANGIOGRAPHY N/A 12/24/2017   Procedure: LEFT HEART CATH AND CORONARY ANGIOGRAPHY;  Surgeon: Lorretta Harp, MD;  Location: [place] [facility];  Service: Cardiovascular;  Laterality: N/A;  . LOOP RECORDER IMPLANT N/A 02/16/2013   Procedure: LOOP RECORDER IMPLANT;  Surgeon: Deboraha Sprang, MD;  Location: [facility] CATH LAB;  Service: Cardiovascular;  Laterality: N/A;  . NASAL SINUS SURGERY  01/2005  . SQUAMOUS CELL CARCINOMA EXCISION     "mostly arms;" (12/24/2017)  . STRESS CARDIOLITE  11/1999   Normal/ negative  . TEAR DUCT PROBING  2005   "? side"  . TEE WITHOUT CARDIOVERSION N/A 02/16/2013   Procedure: TRANSESOPHAGEAL ECHOCARDIOGRAM (TEE);  Surgeon: Josue Hector, MD;  Location: [facility] ENDOSCOPY;  Service: Cardiovascular;  Laterality: N/A;  . TUBAL LIGATION     BTL     Current Outpatient Medications  Medication Sig Dispense Refill  . Blood Glucose Monitoring Suppl (ONE TOUCH ULTRA 2) w/Device KIT Use to check blood sugar once daily 1 each 0  . diphenhydrAMINE (BENADRYL) 25 MG tablet Take 50 mg by mouth every 8 (eight) hours as needed for allergies.     Marland Kitchen EPINEPHrine (EPI-PEN) 0.3 mg/0.3 mL DEVI Inject 0.3 mg into the muscle daily as needed (allergic reaction).     Marland Kitchen erythromycin ophthalmic ointment     . fluticasone (FLONASE) 50 MCG/ACT nasal spray Place 2 sprays into both nostrils 2 (two) times daily as needed. Reported on 09/10/2015    . glipiZIDE (GLUCOTROL XL) 10 MG 24 hr tablet Take 1 tablet (10 mg total) by mouth daily with breakfast. 90 tablet 3  . losartan-hydrochlorothiazide (HYZAAR) 50-12.5 MG tablet Take 1 tablet by mouth daily.     . metFORMIN (GLUCOPHAGE-XR) 500 MG 24 hr tablet TAKE 1 TABLET(500 MG) BY MOUTH DAILY WITH BREAKFAST 90 tablet 3  . nitroGLYCERIN (NITROSTAT) 0.4 MG SL tablet Place 1 tablet (0.4 mg total) under the tongue every 5 (five) minutes as needed for chest pain (up to 3 doses. If taking 3rd dose call  911). 25 tablet 3  . potassium chloride SA (K-DUR,KLOR-CON) 20 MEQ tablet Take 1 tablet (20 mEq total) by mouth daily. 90 tablet 3  . prednisoLONE acetate (PRED FORTE) 1 % ophthalmic suspension SHAKE LQ AND INT 1 GTT IN OU BID FOR 10 DAYS  0  . ticagrelor (BRILINTA) 90 MG TABS tablet Take 1 tablet (90 mg total) by mouth 2 (two) times daily. 60 tablet 11   No current facility-administered medications for this visit.     Allergies:   Bee venom; Nabumetone; Amoxicillin-pot clavulanate; Aspirin; Atorvastatin; Clopidogrel bisulfate; Codeine; Ezetimibe; Metformin and related; Valsartan; and Other    Social History:  The patient  reports that she has never smoked. She has never used smokeless tobacco. She reports that she does not drink alcohol or use  drugs.   Family History:  The patient's family history includes Brain cancer in an other family member; Breast cancer in her sister; Diabetes in her brother and sister; Heart disease in her father and mother; Lung cancer in her brother; Pancreatic cancer in her brother; Skin cancer in her daughter.    ROS: All other systems are reviewed and negative. Unless otherwise mentioned in H&P    PHYSICAL EXAM: VS:  BP 122/72   Pulse 60   Temp (!) 97.2 F (36.2 C)   Ht 5' 9"  (1.753 m)   Wt 194 lb 3.2 oz (88.1 kg)   SpO2 96%   BMI 28.68 kg/m  , BMI Body mass index is 28.68 kg/m. GEN: Well nourished, well developed, in no acute distress HEENT: normal Neck: no JVD, carotid bruits, or masses Cardiac: RRR; no murmurs, rubs, or gallops,no edema  Respiratory:  Clear to auscultation bilaterally, normal work of breathing GI: soft, nontender, nondistended, + BS MS: no deformity or atrophy. Middle finger of the left hand with red birth mark and mild deformity.  Skin: warm and dry, no rash Neuro:  Strength and sensation are intact Psych: euthymic mood, full affect   EKG:  NSR with sinus arrhythmia rate of 60 bpm.   Recent Labs: 09/09/2018: TSH 4.46  09/12/2018: ALT 16; BUN 16; Creatinine, Ser 0.85; Hemoglobin 13.5; Platelets 241; Potassium 4.0; Sodium 139    Lipid Panel    Component Value Date/Time   CHOL 178 09/09/2018 0859   TRIG 153.0 (H) 09/09/2018 0859   HDL 46.20 09/09/2018 0859   CHOLHDL 4 09/09/2018 0859   VLDL 30.6 09/09/2018 0859   LDLCALC 102 (H) 09/09/2018 0859   LDLDIRECT 107.0 08/27/2016 0948      Wt Readings from Last 3 Encounters:  12/15/18 194 lb 3.2 oz (88.1 kg)  12/13/18 194 lb 8 oz (88.2 kg)  11/17/18 194 lb 9 oz (88.3 kg)      Other studies Reviewed: Cath 12/24/2017  Mid RCA lesion is 30% stenosed.  Prox Cx to Mid Cx lesion is 80% stenosed.  A stent was successfully placed.  Post intervention, there is a 0% residual stenosis.  The left ventricular systolic function is normal.  LV end diastolic pressure is normal.  The left ventricular ejection fraction is 55-65% by visual estimate.   ASSESSMENT AND PLAN:  1.  Pre-Operative Cardiology Evaluation:   Chart reviewed as part of pre-operative protocol coverage. Given past medical history and time since last visit, based on ACC/AHA guidelines, Margaret Vazquez would be at acceptable risk for the planned procedure without further cardiovascular testing. She will hold Brilinta for next 24 hours.   2. CAD: Hx of PCI to the proximal mid left Cx, treated DES on 12/25/2018. She will continue on Brilinta as directed but may hold for 24 hours before surgery.   3. Hypertension: She is well controlled. No changes in her medications at this time. Continue losartan/ HCTZ as directed.   4. Hypercholesterolemia: Intolerant to statins. She will need to be referred to lipid clinic.   Phill Myron. Connee Ikner DNP, ANP, AACC  12/15/2018, 2:55 PM    Current medicines are reviewed at length with the patient today.    Labs/ tests ordered today include: None  Phill Myron. [place] Pugh, ANP, AACC   12/15/2018 2:48 PM    [address] Office [phone] Fax [phone]

## 2018-12-15 NOTE — Patient Instructions (Addendum)
Follow-Up: You will need a follow up appointment in 6 months.  Please call our office 2 months in advance, October 2020 to schedule this appointment.  You may see Deboraha Sprang, MD or one of the following Advanced Practice Providers on your designated Care Team:   Chanetta Marshall, NP Tommye Standard, [place]     Medication Instructions:  NO CHANGES- Your physician recommends that you continue on your current medications as directed. Please refer to the Current Medication list given to you today. If you need a refill on your cardiac medications before your next appointment, please call your pharmacy. Labwork: When you have labs (blood work) and your tests are completely normal, you will receive your results ONLY by [place] (if you have MyChart) -OR- A paper copy in the mail.  At [facility], you and your health needs are our priority.  As part of our continuing mission to provide you with exceptional heart care, we have created designated Provider Care Teams.  These Care Teams include your primary Cardiologist (physician) and Advanced Practice Providers (APPs -  Physician Assistants and Nurse Practitioners) who all work together to provide you with the care you need, when you need it.  Thank you for choosing [facility] at [facility]!!

## 2018-12-16 DIAGNOSIS — M65332 Trigger finger, left middle finger: Secondary | ICD-10-CM | POA: Diagnosis not present

## 2018-12-24 DIAGNOSIS — Z9889 Other specified postprocedural states: Secondary | ICD-10-CM | POA: Diagnosis not present

## 2018-12-30 ENCOUNTER — Ambulatory Visit: Payer: HMO

## 2019-01-12 DIAGNOSIS — Z9889 Other specified postprocedural states: Secondary | ICD-10-CM | POA: Diagnosis not present

## 2019-01-17 ENCOUNTER — Other Ambulatory Visit: Payer: Self-pay

## 2019-01-17 NOTE — Patient Outreach (Signed)
  [facility]) Care Management Chronic Special Needs Program  01/17/2019  Name: Margaret Vazquez DOB: 1935/10/22  MRN: [id number]  Ms. Margaret Vazquez is enrolled in a chronic special needs plan for Diabetes. Client called with no answer No answer and HIPAA compliant message left. 1st attempt Plan for 2nd outreach call in one week Chronic care management coordinator will attempt outreach in one week .   Peter Garter RN, Jackquline Denmark, CDE Chronic Care Management Coordinator [facility] Management [phone]

## 2019-01-18 ENCOUNTER — Other Ambulatory Visit: Payer: Self-pay

## 2019-01-18 NOTE — Patient Outreach (Signed)
[facility]) Care Management Chronic Special Needs Program  01/18/2019  Name: Margaret Vazquez DOB: 1936-01-03  MRN: [id number]  Ms. [name] is enrolled in a chronic special needs plan for Diabetes. Chronic Care Management Coordinator telephoned client to review health risk assessment and to develop individualized care plan and client returned call.  Introduced the chronic care management program, importance of client participation, and taking their care plan to all provider appointments and inpatient facilities.  Reviewed the transition of care process and possible referral to community care management.  Subjective: Client states she is past due having her 1 yr follow up appt with cardiology due to the Covid closures.  States she has had more SOB going up stairs and has been feeling more fatigued.  States she does have low blood sugars about once a week when she goes out.  States she tries to take something with her to eat like peanut butter crackers.  States she starts to feel low when her readings get around 90. States her morning sugars range around 100-110.   Goals Addressed            This Visit's Progress   . COMPLETED:  Acknowledge receipt of Advanced Directive package       Received forms    . Advanced Care Planning complete by 9 months   On track   . Client understands the importance of follow-up with providers by attending scheduled visits   On track   . Client will not report change from baseline and no repeated symptoms of stroke with in the next 9 months    On track   . Client will report no fall or injuries in the next 9 months.   On track   . Client will report no worsening of symptoms related to heart disease within the next 6 months       Call your cardiologist to schedule follow up visit    . Client will use Assistive Devices as needed and verbalize understanding of device use   On track   . Client will verbalize knowledge of self management of  Hypertension as evidences by BP reading of 140/90 or less; or as defined by provider   On track   . HEMOGLOBIN A1C < 7.0       Diabetes self management actions:  Glucose monitoring per provider recommendations  Eat Healthy  Check feet daily  Visit provider every 3-6 months as directed  Hbg A1C level every 3-6 months.  Eye Exam yearly    . Maintain timely refills of diabetic medication as prescribed within the year .   On track   . COMPLETED: Obtain annual  Lipid Profile, LDL-C       Completed 09/09/18    . Obtain Annual Eye (retinal)  Exam    On track   . Obtain Annual Foot Exam   On track   . Obtain annual screen for micro albuminuria (urine) , nephropathy (kidney problems)   On track   . Obtain Hemoglobin A1C at least 2 times per year   On track   . Visit Primary Care Provider or Endocrinologist at least 2 times per year    On track    Client is meeting diabetes self management goal of hemoglobin A1C of <7% with last reading of 6.6%.  Declines referral to social work for [employer] but did receive the forms. Instructed to call and schedule follow up appt with cardiology  Instructed that her  glipizide can cause her hypoglycemia and the importance of eating when she takes it.  Encouraged to discuss with her provider other diabetes medications to take instead of the glipizide  Instructed on hypoglycemia and the rule of 15-15.  Instructed on healthy snacks with CHO and protein Reviewed portion control and CHO counting  Reviewed number for 24 hour nurse Line Discussed COVID19 cause, symptoms, precautions (social distancing, stay at home order, hand washing), confirmed client knows how to contact provider.   Plan:  Send successful outreach letter with a copy of their individualized care plan, Send individual care plan to provider and Send educational material- Hypoglycemia, Diabetes snack list  Chronic care management coordination will outreach in:  4-5 Months     Peter Garter RN, [facility] [place] Management Coordinator [place] Management [phone]

## 2019-01-24 ENCOUNTER — Ambulatory Visit: Payer: Self-pay

## 2019-02-25 DIAGNOSIS — E119 Type 2 diabetes mellitus without complications: Secondary | ICD-10-CM | POA: Diagnosis not present

## 2019-02-25 LAB — HM DIABETES EYE EXAM

## 2019-03-03 ENCOUNTER — Encounter: Payer: Self-pay | Admitting: Family Medicine

## 2019-03-11 ENCOUNTER — Other Ambulatory Visit: Payer: Self-pay

## 2019-03-11 ENCOUNTER — Other Ambulatory Visit: Payer: Self-pay | Admitting: Physician Assistant

## 2019-03-11 MED ORDER — TICAGRELOR 90 MG PO TABS: 90.0000 mg | ORAL_TABLET | Freq: Two times a day (BID) | ORAL | 11 refills | Status: DC

## 2019-03-16 ENCOUNTER — Ambulatory Visit: Payer: HMO | Admitting: Family Medicine

## 2019-03-23 ENCOUNTER — Ambulatory Visit (INDEPENDENT_AMBULATORY_CARE_PROVIDER_SITE_OTHER): Payer: HMO | Admitting: Family Medicine

## 2019-03-23 ENCOUNTER — Other Ambulatory Visit: Payer: Self-pay

## 2019-03-23 ENCOUNTER — Encounter: Payer: Self-pay | Admitting: Family Medicine

## 2019-03-23 VITALS — BP 126/64 | HR 61 | Temp 96.5°F | Ht 69.0 in | Wt 192.1 lb

## 2019-03-23 DIAGNOSIS — I10 Essential (primary) hypertension: Secondary | ICD-10-CM

## 2019-03-23 DIAGNOSIS — Z87898 Personal history of other specified conditions: Secondary | ICD-10-CM | POA: Insufficient documentation

## 2019-03-23 DIAGNOSIS — Z23 Encounter for immunization: Secondary | ICD-10-CM

## 2019-03-23 DIAGNOSIS — E785 Hyperlipidemia, unspecified: Secondary | ICD-10-CM | POA: Diagnosis not present

## 2019-03-23 DIAGNOSIS — E1169 Type 2 diabetes mellitus with other specified complication: Secondary | ICD-10-CM

## 2019-03-23 DIAGNOSIS — G40109 Localization-related (focal) (partial) symptomatic epilepsy and epileptic syndromes with simple partial seizures, not intractable, without status epilepticus: Secondary | ICD-10-CM | POA: Diagnosis not present

## 2019-03-23 DIAGNOSIS — I25118 Atherosclerotic heart disease of native coronary artery with other forms of angina pectoris: Secondary | ICD-10-CM | POA: Diagnosis not present

## 2019-03-23 DIAGNOSIS — E1151 Type 2 diabetes mellitus with diabetic peripheral angiopathy without gangrene: Secondary | ICD-10-CM

## 2019-03-23 LAB — COMPREHENSIVE METABOLIC PANEL
ALT: 14 U/L (ref 0–35)
AST: 17 U/L (ref 0–37)
Albumin: 3.9 g/dL (ref 3.5–5.2)
Alkaline Phosphatase: 68 U/L (ref 39–117)
BUN: 16 mg/dL (ref 6–23)
CO2: 30 mEq/L (ref 19–32)
Calcium: 9.8 mg/dL (ref 8.4–10.5)
Chloride: 104 mEq/L (ref 96–112)
Creatinine, Ser: 0.93 mg/dL (ref 0.40–1.20)
GFR: 57.79 mL/min — ABNORMAL LOW (ref >60.00)
Glucose, Bld: 108 mg/dL — ABNORMAL HIGH (ref 70–99)
Potassium: 4 mEq/L (ref 3.5–5.1)
Sodium: 140 mEq/L (ref 135–145)
Total Bilirubin: 0.6 mg/dL (ref 0.2–1.2)
Total Protein: 6.9 g/dL (ref 6.0–8.3)

## 2019-03-23 LAB — POCT GLYCOSYLATED HEMOGLOBIN (HGB A1C): Hemoglobin A1C: 6.1 % — AB (ref 4.0–5.6)

## 2019-03-23 LAB — LIPID PANEL
Cholesterol: 168 mg/dL (ref 0–200)
HDL: 42.5 mg/dL (ref >39.00)
LDL Cholesterol: 91 mg/dL (ref 0–99)
NonHDL: 125.03
Total CHOL/HDL Ratio: 4
Triglycerides: 169 mg/dL — ABNORMAL HIGH (ref 0.0–149.0)
VLDL: 33.8 mg/dL (ref 0.0–40.0)

## 2019-03-23 NOTE — Assessment & Plan Note (Signed)
bp in fair control at this time  BP Readings from Last 1 Encounters:  03/23/19 126/64   No changes needed Most recent labs reviewed  Disc lifstyle change with low sodium diet and exercise  Labs today

## 2019-03-23 NOTE — Progress Notes (Signed)
Subjective:    Patient ID: Margaret Vazquez, female    DOB: 1936/06/26, 83 y.o.   MRN: [id number]  HPI Here for f/u of chronic medical problems   Wt Readings from Last 3 Encounters:  03/23/19 192 lb 1 oz (87.1 kg)  12/15/18 194 lb 3.2 oz (88.1 kg)  12/13/18 194 lb 8 oz (88.2 kg)  wt is down 2 lb   28.36 kg/m   A little more sob about a month ago  Has to do stairs from basement with laundry -hard to do  No swelling    Had trigger finger surgery went well Finger is still mildly sore   bp is stable today  No cp or palpitations or headaches or edema  No side effects to medicines  BP Readings from Last 3 Encounters:  03/23/19 126/64  12/15/18 122/72  12/13/18 110/70   Pulse Readings from Last 3 Encounters:  03/23/19 61  12/15/18 60  12/13/18 (!) 58      Lab Results  Component Value Date   CREATININE 0.85 09/12/2018   BUN 16 09/12/2018   NA 139 09/12/2018   K 4.0 09/12/2018   CL 105 09/12/2018   CO2 24 09/12/2018   Lab Results  Component Value Date   ALT 16 09/12/2018   AST 19 09/12/2018   ALKPHOS 58 09/12/2018   BILITOT 0.8 09/12/2018    Flu vaccine - will get today  Will do high dose    Hyperlipidemia Lab Results  Component Value Date   CHOL 178 09/09/2018   HDL 46.20 09/09/2018   LDLCALC 102 (H) 09/09/2018   LDLDIRECT 107.0 08/27/2016   TRIG 153.0 (H) 09/09/2018   CHOLHDL 4 09/09/2018   Intol to statins  Was in study - had 2 injections ? Med  She has had worse joint and muscle pain -had to stop study due to virus She let cardiology know she had side effects   Having problems with L buttock /low back -ongoing  Has not been to orthopedics about that    DM2 Lab Results  Component Value Date   HGBA1C 6.1 (A) 03/23/2019  excellent control!  Down from 6.6  She is making the effort to eat better  Eye exam was 8/20  Losartan  Cannot take statins    Patient Active Problem List   Diagnosis Date Noted   Focal motor seizure ([place]) 03/23/2019    Breast lump on left side at 10 o'clock position 11/17/2018   Left-sided epistaxis 11/17/2018   Medicare annual wellness visit, subsequent 09/13/2018   Screening mammogram, encounter for 09/13/2018   Herpes zoster 08/10/2018   Plantar wart, left foot 06/15/2018   Atherosclerotic heart disease of native coronary artery with other forms of angina pectoris ([place]) 12/24/2017   Routine general medical examination at a health care facility 08/24/2016   PAF (paroxysmal atrial fibrillation) ([place]) 04/07/2016   Overweight (BMI 25.0-29.9) 08/23/2014   Type 2 diabetes mellitus with diabetic peripheral angiopathy without gangrene, without long-term current use of insulin ([place]) 01/12/2014   TIA (transient ischemic attack) 01/11/2014   CVA (cerebral infarction) 01/11/2014   LOOP Recorder LINQ 06/07/2013   Fatigue 06/07/2013   Sinusitis, chronic 05/16/2013   H/O: CVA (cerebrovascular accident) 02/15/2013   Tingling of upper extremity 02/14/2013   Encounter for Medicare annual wellness exam 10/27/2012   Encopresis(307.7) 12/15/2011   Post-menopausal 10/28/2011   PERSONAL HX COLONIC POLYPS 09/04/2009   B12 deficiency 03/06/2009   ALLERGIC RHINITIS 02/14/2008   BACK PAIN, LUMBAR  11/03/2007   Hyperlipidemia associated with type 2 diabetes mellitus ([place]) 11/24/2006   Essential hypertension 11/24/2006   GERD 10/09/2006   OVERACTIVE BLADDER 10/09/2006   INCONTINENCE, URGE 10/09/2006   SKIN CANCER, HX OF 10/09/2006   Past Medical History:  Diagnosis Date   Allergy history, drug    Aspirin   Basal cell carcinoma    "back"   CAD (coronary artery disease)    a. Previously nonobstructive then progressive angina with abnl CT -> cardiac cath 12/24/17 showed 30% mid RCA and 80% prox-mid Cx. She received DES to mid AV groove Cx. EF 55-65%.    Cervical stenosis of spine    With neck pain   CKD (chronic kidney disease), stage II    Colon polyps 1497   Complication of  anesthesia 1980s   slow to wake after anesthesia "when I had breast biopsy"   GERD (gastroesophageal reflux disease)    Hyperlipidemia    myalgias with Lipitor and Zetia   Hypertension    Migraine    "stopped at age 43" (12/24/2017)   Squamous carcinoma    "iced off and cut off; mostly arms" (12/24/2017)   Stroke [facility])    "told me I'd had 2 strokes in 02/2013"; denies residual on 12/24/2017   TIA (transient ischemic attack)    Type II diabetes mellitus ([place])    Past Surgical History:  Procedure Laterality Date   ABD [place]  07/2003   Negative   APPENDECTOMY     BASAL CELL CARCINOMA EXCISION     "back"   BREAST BIOPSY Left 1992   benign   CARDIAC CATHETERIZATION  04/07/2011   non obst CAD (Dr Burt Knack)   CATARACT EXTRACTION W/PHACO Left 04/01/2017   Procedure: CATARACT EXTRACTION PHACO AND INTRAOCULAR LENS PLACEMENT ([place]) LEFT DIABETIC;  Surgeon: Leandrew Koyanagi, MD;  Location: [facility];  Service: Ophthalmology;  Laterality: Left;  Diabetic - oral meds   CATARACT EXTRACTION W/PHACO Right 04/29/2017   Procedure: CATARACT EXTRACTION PHACO AND INTRAOCULAR LENS PLACEMENT ([place]) RIGHT DIABETIC;  Surgeon: Leandrew Koyanagi, MD;  Location: [facility];  Service: Ophthalmology;  Laterality: Right;  Diabetic - oral meds   COLONOSCOPY  12/2007   Adenomatous colon polyps   CORONARY ANGIOPLASTY WITH STENT PLACEMENT  12/24/2017   CORONARY STENT INTERVENTION N/A 12/24/2017   Procedure: CORONARY STENT INTERVENTION;  Surgeon: Lorretta Harp, MD;  Location: [facility];  Service: Cardiovascular;  Laterality: N/A;   CYSTOSCOPY W/ DECANNULATION  03/2000   Normal   LEFT HEART CATH AND CORONARY ANGIOGRAPHY N/A 12/24/2017   Procedure: LEFT HEART CATH AND CORONARY ANGIOGRAPHY;  Surgeon: Lorretta Harp, MD;  Location: [facility];  Service: Cardiovascular;  Laterality: N/A;   LOOP RECORDER IMPLANT N/A 02/16/2013   Procedure: LOOP RECORDER IMPLANT;   Surgeon: Deboraha Sprang, MD;  Location: [facility] CATH LAB;  Service: Cardiovascular;  Laterality: N/A;   NASAL SINUS SURGERY  01/2005   SQUAMOUS CELL CARCINOMA EXCISION     "mostly arms;" (12/24/2017)   STRESS CARDIOLITE  11/1999   Normal/ negative   TEAR DUCT PROBING  2005   "? side"   TEE WITHOUT CARDIOVERSION N/A 02/16/2013   Procedure: TRANSESOPHAGEAL ECHOCARDIOGRAM (TEE);  Surgeon: Josue H[place], MD;  Location: [facility] ENDOSCOPY;  Service: Cardiovascular;  Laterality: N/A;   TUBAL LIGATION     BTL   Social History   Tobacco Use   Smoking status: Never Smoker   Smokeless tobacco: Never Used  Substance Use  Topics   Alcohol use: Never    Alcohol/week: 0.0 standard drinks    Frequency: Never   Drug use: Never   Family History  Problem Relation Age of Onset   Lung cancer Brother    Diabetes Brother    Pancreatic cancer Brother    Heart disease Mother    Heart disease Father    Brain cancer Other    Skin cancer Daughter    Diabetes Sister    Breast cancer Sister    Colon cancer Neg Hx    Allergies  Allergen Reactions   Bee Venom Hives, Shortness Of Breath and Swelling   Nabumetone Anaphylaxis   Amoxicillin-Pot Clavulanate Hives and Swelling    To lips.   Aspirin Hives   Atorvastatin Swelling     joint pain/swelling, inc liver tests   Clopidogrel Bisulfate Hives   Codeine Nausea And Vomiting   Ezetimibe Other (See Comments)     fatigue   Metformin And Related Other (See Comments)    Diarrhea    Valsartan Other (See Comments)     fatigue   Other Hives    **Red Meat**  SOB   Current Outpatient Medications on File Prior to Visit  Medication Sig Dispense Refill   Blood Glucose Monitoring Suppl (ONE TOUCH ULTRA 2) w/Device KIT Use to check blood sugar once daily 1 each 0   BRILINTA 90 MG TABS tablet TAKE 1 TABLET BY MOUTH TWICE DAILY 60 tablet 8   diphenhydrAMINE (BENADRYL) 25 MG tablet Take 50 mg by mouth every 8 (eight) hours as needed for  allergies.      EPINEPHrine (EPI-PEN) 0.3 mg/0.3 mL DEVI Inject 0.3 mg into the muscle daily as needed (allergic reaction).      erythromycin ophthalmic ointment      fluticasone (FLONASE) 50 MCG/ACT nasal spray Place 2 sprays into both nostrils 2 (two) times daily as needed. Reported on 09/10/2015     glipiZIDE (GLUCOTROL XL) 10 MG 24 hr tablet Take 1 tablet (10 mg total) by mouth daily with breakfast. 90 tablet 3   losartan-hydrochlorothiazide (HYZAAR) 50-12.5 MG tablet Take 1 tablet by mouth daily.      metFORMIN (GLUCOPHAGE-XR) 500 MG 24 hr tablet TAKE 1 TABLET(500 MG) BY MOUTH DAILY WITH BREAKFAST 90 tablet 3   nitroGLYCERIN (NITROSTAT) 0.4 MG SL tablet Place 1 tablet (0.4 mg total) under the tongue every 5 (five) minutes as needed for chest pain (up to 3 doses. If taking 3rd dose call 911). 25 tablet 3   potassium chloride SA (K-DUR,KLOR-CON) 20 MEQ tablet Take 1 tablet (20 mEq total) by mouth daily. 90 tablet 3   prednisoLONE acetate (PRED FORTE) 1 % ophthalmic suspension SHAKE LQ AND INT 1 GTT IN OU BID FOR 10 DAYS  0   No current facility-administered medications on file prior to visit.      Review of Systems  Constitutional: Negative for activity change, appetite change, fatigue, fever and unexpected weight change.  HENT: Negative for congestion, ear pain, rhinorrhea, sinus pressure and sore throat.   Eyes: Negative for pain, redness and visual disturbance.  Respiratory: Negative for cough, shortness of breath and wheezing.   Cardiovascular: Negative for chest pain and palpitations.  Gastrointestinal: Negative for abdominal pain, blood in stool, constipation and diarrhea.  Endocrine: Negative for polydipsia and polyuria.  Genitourinary: Negative for dysuria, frequency and urgency.  Musculoskeletal: Positive for arthralgias, back pain, gait problem and myalgias. Negative for joint swelling.  Skin: Negative for pallor and rash.  Allergic/Immunologic: Negative for environmental  allergies.  Neurological: Negative for dizziness, syncope and headaches.  Hematological: Negative for adenopathy. Does not bruise/bleed easily.  Psychiatric/Behavioral: Negative for decreased concentration and dysphoric mood. The patient is not nervous/anxious.        Objective:   Physical Exam Constitutional:      General: She is not in acute distress.    Appearance: Normal appearance. She is well-developed. She is not ill-appearing.     Comments: Overweight   HENT:     Head: Normocephalic and atraumatic.     Mouth/Throat:     Mouth: Mucous membranes are moist.     Pharynx: Oropharynx is clear.  Eyes:     General: No scleral icterus.    Conjunctiva/sclera: Conjunctivae normal.     Pupils: Pupils are equal, round, and reactive to light.  Neck:     Musculoskeletal: Normal range of motion and neck supple. No neck rigidity or muscular tenderness.     Thyroid: No thyromegaly.     Vascular: No carotid bruit or JVD.  Cardiovascular:     Rate and Rhythm: Normal rate and regular rhythm.     Pulses: Normal pulses.     Heart sounds: Normal heart sounds. No gallop.   Pulmonary:     Effort: Pulmonary effort is normal. No respiratory distress.     Breath sounds: Normal breath sounds. No wheezing or rales.     Comments: Good air exch  No crackles or rales Abdominal:     General: Bowel sounds are normal. There is no distension or abdominal bruit.     Palpations: Abdomen is soft. There is no mass.     Tenderness: There is no abdominal tenderness.     Hernia: No hernia is present.  Musculoskeletal:     Left lower leg: No edema.  Lymphadenopathy:     Cervical: No cervical adenopathy.  Skin:    General: Skin is warm and dry.     Findings: No rash.     Comments: Solar lentigines diffusely Solar aging and sks   Some bruises of different ages (on anticoag) with thin skin  Neurological:     Mental Status: She is alert.     Coordination: Coordination normal.     Deep Tendon Reflexes:  Reflexes are normal and symmetric. Reflexes normal.     Comments: Slow gait due to pain   Psychiatric:        Mood and Affect: Mood normal.           Assessment & Plan:   Problem List Items Addressed This Visit      Cardiovascular and Mediastinum   Essential hypertension    bp in fair control at this time  BP Readings from Last 1 Encounters:  03/23/19 126/64   No changes needed Most recent labs reviewed  Disc lifstyle change with low sodium diet and exercise  Labs today       Relevant Orders   Comprehensive metabolic panel (Completed)   Type 2 diabetes mellitus with diabetic peripheral angiopathy without gangrene, without long-term current use of insulin ([place]) - Primary    Lab Results  Component Value Date   HGBA1C 6.1 (A) 03/23/2019   In good control  Making a good diet effort utd eye and foot care On arb Cannot tolerate statins        Relevant Orders   POCT glycosylated hemoglobin (Hb A1C) (Completed)   Atherosclerotic heart disease of native coronary artery with other forms of angina pectoris (  [place])    Pt mentions more sob with exertion lately (stairs at home especially) No chest discomfort Nl exam  No pedal edema  Urged her to make cardiology aware         Endocrine   Hyperlipidemia associated with type 2 diabetes mellitus ([place])    Absolutely intolerant of statins  Was in a trial of new drug-unfortunately per pt caused pain and she stopped it  Lipid panel today  Diet is fairly good but expect LDL to be up based on above      Relevant Orders   Lipid panel (Completed)     Nervous and Auditory   Focal motor seizure ([place])    No recent seizures       Other Visit Diagnoses    Need for influenza vaccination       Relevant Orders   Flu Vaccine QUAD High Dose(Fluad) (Completed)

## 2019-03-23 NOTE — Assessment & Plan Note (Signed)
Lab Results  Component Value Date   HGBA1C 6.1 (A) 03/23/2019   In good control  Making a good diet effort utd eye and foot care On arb Cannot tolerate statins

## 2019-03-23 NOTE — Patient Instructions (Addendum)
It looks like you are due for cardiology visit in December Do call them now to let them know about the shortness of breath   Diabetes control is good   Keep eating a healthy diet Labs today -cholesterol and chemistries  Flu shot today

## 2019-03-23 NOTE — Assessment & Plan Note (Signed)
No recent seizures

## 2019-03-23 NOTE — Assessment & Plan Note (Signed)
Absolutely intolerant of statins  Was in a trial of new drug-unfortunately per pt caused pain and she stopped it  Lipid panel today  Diet is fairly good but expect LDL to be up based on above

## 2019-03-23 NOTE — Assessment & Plan Note (Signed)
Pt mentions more sob with exertion lately (stairs at home especially) No chest discomfort Nl exam  No pedal edema  Urged her to make cardiology aware

## 2019-03-24 ENCOUNTER — Encounter: Payer: Self-pay | Admitting: *Deleted

## 2019-03-31 DIAGNOSIS — D0462 Carcinoma in situ of skin of left upper limb, including shoulder: Secondary | ICD-10-CM | POA: Diagnosis not present

## 2019-03-31 DIAGNOSIS — X32XXXA Exposure to sunlight, initial encounter: Secondary | ICD-10-CM | POA: Diagnosis not present

## 2019-03-31 DIAGNOSIS — D485 Neoplasm of uncertain behavior of skin: Secondary | ICD-10-CM | POA: Diagnosis not present

## 2019-03-31 DIAGNOSIS — Z08 Encounter for follow-up examination after completed treatment for malignant neoplasm: Secondary | ICD-10-CM | POA: Diagnosis not present

## 2019-03-31 DIAGNOSIS — Z85828 Personal history of other malignant neoplasm of skin: Secondary | ICD-10-CM | POA: Diagnosis not present

## 2019-03-31 DIAGNOSIS — D0439 Carcinoma in situ of skin of other parts of face: Secondary | ICD-10-CM | POA: Diagnosis not present

## 2019-03-31 DIAGNOSIS — L570 Actinic keratosis: Secondary | ICD-10-CM | POA: Diagnosis not present

## 2019-03-31 DIAGNOSIS — D0461 Carcinoma in situ of skin of right upper limb, including shoulder: Secondary | ICD-10-CM | POA: Diagnosis not present

## 2019-04-08 ENCOUNTER — Other Ambulatory Visit: Payer: Self-pay | Admitting: Family Medicine

## 2019-04-19 DIAGNOSIS — D0461 Carcinoma in situ of skin of right upper limb, including shoulder: Secondary | ICD-10-CM | POA: Diagnosis not present

## 2019-04-19 DIAGNOSIS — D0462 Carcinoma in situ of skin of left upper limb, including shoulder: Secondary | ICD-10-CM | POA: Diagnosis not present

## 2019-05-11 ENCOUNTER — Other Ambulatory Visit: Payer: Self-pay

## 2019-05-11 NOTE — Patient Outreach (Signed)
  [place] [facility]) Care Management Chronic Special Needs Program  05/11/2019  Name: YISELA HAMMERSTROM DOB: 16-Nov-1935  MRN: [id number]  Ms. Yoali Boze is enrolled in a chronic special needs plan for Diabetes. Client called with no answer No answer and HIPAA compliant message left. 1st attempt Plan for 2nd outreach call in one week Chronic care management coordinator will attempt outreach in one week.   Peter Garter RN, Jackquline Denmark, CDE Chronic Care Management Coordinator [facility] Management [phone]

## 2019-05-12 ENCOUNTER — Other Ambulatory Visit: Payer: Self-pay

## 2019-05-12 NOTE — Patient Outreach (Signed)
  [place] [facility]) Care Management Chronic Special Needs Program  05/12/2019  Name: Margaret Vazquez DOB: 14-Jun-1936  MRN: [id number]  Margaret Vazquez is enrolled in a chronic special needs plan for Diabetes. Client called with no answer No answer and HIPAA compliant message left. RNCM returning message that client left Plan for 2nd outreach call in one week Chronic care management coordinator will attempt outreach in one week as scheduled.   Peter Garter RN, Margaret Vazquez, Margaret Vazquez Chronic Care Management Coordinator [facility] [phone]

## 2019-05-18 ENCOUNTER — Other Ambulatory Visit: Payer: Self-pay

## 2019-05-18 NOTE — Patient Outreach (Signed)
  [place] [facility]) Care Management Chronic Special Needs Program  05/18/2019  Name: Margaret Vazquez DOB: [birth year]  MRN: [id number]  [name] is enrolled in a chronic special needs plan for Diabetes. Reviewed and updated care plan.  Subjective: Client states she has been doing good.  States that her blood sugars range from 90-110 in the morning.  States she sometimes has low readings when she has been out and did not get to eat on time.  Denies any falls.  Denies any chest pain but states she sometimes has a little shortness of breath when she is walking.  States she is to see her cardiologist in December.  Goals Addressed            This Visit's Progress   . Advanced Care Planning complete by 9 months(continue 05/18/19)   On track   . Client understands the importance of follow-up with providers by attending scheduled visits   On track   . Client will not report change from baseline and no repeated symptoms of stroke with in the next 9 months    On track   . Client will report no fall or injuries in the next 9 months.(continue 05/18/19)   On track   . Client will report no worsening of symptoms related to heart disease within the next 6 months(continue 05/18/19)   On track   . Client will use Assistive Devices as needed and verbalize understanding of device use   On track   . Client will verbalize knowledge of self management of Hypertension as evidences by BP reading of 140/90 or less; or as defined by provider   On track   . HEMOGLOBIN A1C < 7.0        Diabetes self management actions:  Glucose monitoring per provider recommendations  Eat Healthy  Check feet daily  Visit provider every 3-6 months as directed  Hbg A1C level every 3-6 months.  Eye Exam yearly    . Maintain timely refills of diabetic medication as prescribed within the year .   On track   . COMPLETED: Obtain Annual Eye (retinal)  Exam        Completed 02/25/19    . Obtain Annual Foot Exam    On track   . Obtain annual screen for micro albuminuria (urine) , nephropathy (kidney problems)   On track   . COMPLETED: Obtain Hemoglobin A1C at least 2 times per year       Completed 09/09/18,03/23/19    . COMPLETED: Visit Primary Care Provider or Endocrinologist at least 2 times per year        Completed 09/13/18,11/17/18, 03/23/19     Client is meeting diabetes self management goal of hemoglobin A1C of <7% with last reading of 6.1%  Instructed to call her doctor if she has any increase in shortness of breath or chest pains Reinforced to follow a low CHO, low sodium diet and reviewed portion sizes of fruit Encouraged to take a snack with her when she is going out and reviewed on how to treat hypoglycemia Reviewed number for 24-hour nurse Line Reviewed COVID 19 precautions  Plan:  Send successful outreach letter with a copy of their individualized care plan and Send individual care plan to provider  Chronic care management coordinator will outreach in:  4-6 Months     Peter Garter RN, [facility] [place] Management Coordinator [place] [facility] [phone]

## 2019-06-01 ENCOUNTER — Ambulatory Visit: Payer: HMO

## 2019-07-07 ENCOUNTER — Ambulatory Visit: Payer: HMO | Attending: Internal Medicine

## 2019-07-07 DIAGNOSIS — Z20822 Contact with and (suspected) exposure to covid-19: Secondary | ICD-10-CM

## 2019-07-11 ENCOUNTER — Telehealth: Payer: Self-pay | Admitting: Family Medicine

## 2019-07-11 ENCOUNTER — Telehealth: Payer: Self-pay

## 2019-07-11 ENCOUNTER — Ambulatory Visit: Payer: Self-pay | Admitting: Family Medicine

## 2019-07-11 NOTE — Telephone Encounter (Signed)
Caller advise that result not back yet

## 2019-07-11 NOTE — Telephone Encounter (Signed)
Patient is calling to receive her COVID test result from 07/07/19 was advised that [facility] did not pick up the test. Was advised to rescheduled COVID test.

## 2019-07-11 NOTE — Telephone Encounter (Signed)
Pt called to get covid test results. Results sill pending.   [place]

## 2019-07-11 NOTE — Telephone Encounter (Signed)
Attempted to reach pt, no answer. Please see encounter..  From Agent:  Patient is calling because her 07/06/2019 COVID test was not collected. Patient does not feel sick and would like to know can she come out of quarantine and no symptoms.

## 2019-07-13 LAB — NOVEL CORONAVIRUS, NAA

## 2019-08-02 DIAGNOSIS — I639 Cerebral infarction, unspecified: Secondary | ICD-10-CM | POA: Insufficient documentation

## 2019-08-03 ENCOUNTER — Other Ambulatory Visit: Payer: Self-pay

## 2019-08-03 ENCOUNTER — Ambulatory Visit: Payer: HMO | Admitting: Internal Medicine

## 2019-08-03 ENCOUNTER — Encounter: Payer: Self-pay | Admitting: Internal Medicine

## 2019-08-03 VITALS — BP 134/70 | HR 64 | Ht 69.0 in | Wt 192.8 lb

## 2019-08-03 DIAGNOSIS — I10 Essential (primary) hypertension: Secondary | ICD-10-CM

## 2019-08-03 DIAGNOSIS — I480 Paroxysmal atrial fibrillation: Secondary | ICD-10-CM

## 2019-08-03 DIAGNOSIS — I639 Cerebral infarction, unspecified: Secondary | ICD-10-CM | POA: Diagnosis not present

## 2019-08-03 DIAGNOSIS — I25118 Atherosclerotic heart disease of native coronary artery with other forms of angina pectoris: Secondary | ICD-10-CM

## 2019-08-03 MED ORDER — ISOSORBIDE MONONITRATE ER 60 MG PO TB24: 60.0000 mg | ORAL_TABLET | Freq: Every day | ORAL | 3 refills | Status: DC

## 2019-08-03 MED ORDER — LOSARTAN POTASSIUM-HCTZ 50-12.5 MG PO TABS: 0.5000 | ORAL_TABLET | Freq: Every day | ORAL | 3 refills | Status: DC

## 2019-08-03 NOTE — Patient Instructions (Addendum)
Medication Instructions:  Your physician has recommended you make the following change in your medication:  Begin taking Imdur 60mg  - 1 tablet by mouth daily  Losartan Potassium-HCTZ 50mg -12.5mg , you will begin taking a 1/2 tablet by mouth daily.  *If you need a refill on your cardiac medications before your next appointment, please call your pharmacy*  Lab Work: None ordered.  If you have labs (blood work) drawn today and your tests are completely normal, you will receive your results only by: Marland Kitchen MyChart Message (if you have MyChart) OR . A paper copy in the mail If you have any lab test that is abnormal or we need to change your treatment, we will call you to review the results.  Testing/Procedures: None ordered.   Follow-Up: At [facility], you and your health needs are our priority.  As part of our continuing mission to provide you with exceptional heart care, we have created designated Provider Care Teams.  These Care Teams include your primary Cardiologist (physician) and Advanced Practice Providers (APPs -  Physician Assistants and Nurse Practitioners) who all work together to provide you with the care you need, when you need it.  Your next appointment:    09/08/2019 at 245pm - Telehealth Visit

## 2019-08-03 NOTE — Progress Notes (Signed)
[email or web address]      Patient Care Team: Tower, [name], MD as PCP - General (Family Medicine) Deboraha Sprang, MD as PCP - Cardiology (Cardiology) Dasher, Rayvon Char, MD as Consulting Physician (Dermatology) Salomon Fick., MD as Referring Physician (Dentistry) Leandrew Koyanagi, MD as Referring Physician (Ophthalmology) Dimitri Ped, RN as [place] [facility]   HPI  Margaret Vazquez is a 84 y.o. female Seen in followup for a loop recorder implanted for cryptogenic stroke. She has significant hypertension which is much improved  She was found to have atrial fibrillation that prompted a visit to the atrial fibrillation clinic.This event occurred in the context of treatment anaphylaxis w epinephrine.. Discussion with Dr. Leonie Man suggested no indication for anticoagulation    She was found to have coronary artery disease based on abnormal FFR.  And her circumflex was stented     Complaining of a 1 year history of relatively stable exertional chest discomfort relieved by rest.  Reminiscent of her prestenting discomfort.  Associated with shortness of breath.  No nocturnal dyspnea orthopnea or peripheral edema  Currently taking Brilinta.  Is allergic to aspirin and Plavix.  Some nuisance bleeding.    She has Carotid disease but has been intolerant of statins      Past Medical History:  Diagnosis Date  . Allergy history, drug    Aspirin  . Basal cell carcinoma    "back"  . CAD (coronary artery disease)    a. Previously nonobstructive then progressive angina with abnl CT -> cardiac cath 12/24/17 showed 30% mid RCA and 80% prox-mid Cx. She received DES to mid AV groove Cx. EF 55-65%.   . Cervical stenosis of spine    With neck pain  . CKD (chronic kidney disease), stage II   . Colon polyps 2009  . Complication of anesthesia 1980s   slow to wake after anesthesia "when I had breast biopsy"  . GERD (gastroesophageal reflux disease)   . Hyperlipidemia    myalgias with Lipitor and Zetia  . Hypertension   . Migraine    "stopped at age 69" (12/24/2017)  . Squamous carcinoma    "iced off and cut off; mostly arms" (12/24/2017)  . Stroke [facility])    "told me I'd had 2 strokes in 02/2013"; denies residual on 12/24/2017  . TIA (transient ischemic attack)   . Type II diabetes mellitus ([place])     Past Surgical History:  Procedure Laterality Date  . ABD [place]  07/2003   Negative  . APPENDECTOMY    . BASAL CELL CARCINOMA EXCISION     "back"  . BREAST BIOPSY Left 1992   benign  . CARDIAC CATHETERIZATION  04/07/2011   non obst CAD (Dr Burt Knack)  . CATARACT EXTRACTION W/PHACO Left 04/01/2017   Procedure: CATARACT EXTRACTION PHACO AND INTRAOCULAR LENS PLACEMENT ([place]) LEFT DIABETIC;  Surgeon: Leandrew Koyanagi, MD;  Location: [place];  Service: Ophthalmology;  Laterality: Left;  Diabetic - oral meds  . CATARACT EXTRACTION W/PHACO Right 04/29/2017   Procedure: CATARACT EXTRACTION PHACO AND INTRAOCULAR LENS PLACEMENT ([place]) RIGHT DIABETIC;  Surgeon: Leandrew Koyanagi, MD;  Location: [place];  Service: Ophthalmology;  Laterality: Right;  Diabetic - oral meds  . COLONOSCOPY  12/2007   Adenomatous colon polyps  . CORONARY ANGIOPLASTY WITH STENT PLACEMENT  12/24/2017  . CORONARY STENT INTERVENTION N/A 12/24/2017   Procedure: CORONARY STENT INTERVENTION;  Surgeon: Lorretta Harp, MD;  Location: [place] CV LAB;  Service: Cardiovascular;  Laterality:  N/A;  . CYSTOSCOPY W/ DECANNULATION  03/2000   Normal  . LEFT HEART CATH AND CORONARY ANGIOGRAPHY N/A 12/24/2017   Procedure: LEFT HEART CATH AND CORONARY ANGIOGRAPHY;  Surgeon: Lorretta Harp, MD;  Location: [place] CV LAB;  Service: Cardiovascular;  Laterality: N/A;  . LOOP RECORDER IMPLANT N/A 02/16/2013   Procedure: LOOP RECORDER IMPLANT;  Surgeon: Deboraha Sprang, MD;  Location: [facility] CATH LAB;  Service: Cardiovascular;  Laterality: N/A;  . NASAL SINUS SURGERY  01/2005  . SQUAMOUS  CELL CARCINOMA EXCISION     "mostly arms;" (12/24/2017)  . STRESS CARDIOLITE  11/1999   Normal/ negative  . TEAR DUCT PROBING  2005   "? side"  . TEE WITHOUT CARDIOVERSION N/A 02/16/2013   Procedure: TRANSESOPHAGEAL ECHOCARDIOGRAM (TEE);  Surgeon: Josue Hector, MD;  Location: [facility] ENDOSCOPY;  Service: Cardiovascular;  Laterality: N/A;  . TUBAL LIGATION     BTL    Current Outpatient Medications  Medication Sig Dispense Refill  . Blood Glucose Monitoring Suppl (ONE TOUCH ULTRA 2) w/Device KIT Use to check blood sugar once daily 1 each 0  . BRILINTA 90 MG TABS tablet TAKE 1 TABLET BY MOUTH TWICE DAILY 60 tablet 8  . diphenhydrAMINE (BENADRYL) 25 MG tablet Take 50 mg by mouth every 8 (eight) hours as needed for allergies.     Marland Kitchen EPINEPHrine (EPI-PEN) 0.3 mg/0.3 mL DEVI Inject 0.3 mg into the muscle daily as needed (allergic reaction).     Marland Kitchen erythromycin ophthalmic ointment     . fluticasone (FLONASE) 50 MCG/ACT nasal spray Place 2 sprays into both nostrils 2 (two) times daily as needed. Reported on 09/10/2015    . glipiZIDE (GLUCOTROL XL) 10 MG 24 hr tablet Take 1 tablet (10 mg total) by mouth daily with breakfast. 90 tablet 3  . losartan-hydrochlorothiazide (HYZAAR) 50-12.5 MG tablet TAKE 1 TABLET BY MOUTH DAILY 90 tablet 3  . metFORMIN (GLUCOPHAGE-XR) 500 MG 24 hr tablet TAKE 1 TABLET(500 MG) BY MOUTH DAILY WITH BREAKFAST 90 tablet 3  . nitroGLYCERIN (NITROSTAT) 0.4 MG SL tablet Place 1 tablet (0.4 mg total) under the tongue every 5 (five) minutes as needed for chest pain (up to 3 doses. If taking 3rd dose call 911). 25 tablet 3  . potassium chloride SA (K-DUR,KLOR-CON) 20 MEQ tablet Take 1 tablet (20 mEq total) by mouth daily. 90 tablet 3   No current facility-administered medications for this visit.    Allergies  Allergen Reactions  . Bee Venom Hives, Shortness Of Breath and Swelling  . Nabumetone Anaphylaxis  . Amoxicillin-Pot Clavulanate Hives and Swelling    To lips.  . Aspirin Hives   . Atorvastatin Swelling     joint pain/swelling, inc liver tests  . Clopidogrel Bisulfate Hives  . Codeine Nausea And Vomiting  . Ezetimibe Other (See Comments)     fatigue  . Metformin And Related Other (See Comments)    Diarrhea   . Valsartan Other (See Comments)     fatigue  . Other Hives    **Red Meat**  SOB    Review of Systems negative except from HPI and PMH  Physical Exam BP 134/70   Pulse 64   Ht _0  (1.753 m)   Wt 192 lb 12.8 oz (87.5 kg)   SpO2 97%   BMI 28.47 kg/m   Well developed and nourished in no acute distress HENT normal Neck supple with JVP-  flat   Clear Regular rate and rhythm, no murmurs or gallops Abd-soft  with active BS No Clubbing cyanosis edema Skin-warm and dry A & Oriented  Grossly normal sensory and motor function  ECG sinus rhythm at 64 Interval 18/09/39 Otherwise normal   Assessment and  Plan  Cryptogenic stroke  Implantable loop recorder end of service  Elevated blood pressure  Hyperlipidemia  Carotid disease  Coronary disease s/p Cx stenting  Exertional chest pain consistent with angina-new  No interval atrial fibrillation of which she is aware.  More exertional chest discomfort consistent with angina.  She may benefit from PCSK9 inhibitor therapy as she is intolerant of statins and ezetimibe.  We will begin her on medical therapy for her angina and then revisit her status prior to proceeding with catheterization.  We will begin with isosorbide 60 mg daily.  Continue her on losartan given her diabetes.  Hemoglobin A1c is 6.1.  Renal protection. We will decrease the dose 50/25--25/12.5.

## 2019-08-09 NOTE — Addendum Note (Signed)
Addended by: Rose Phi on: 08/09/2019 05:10 PM   Modules accepted: Orders

## 2019-08-16 ENCOUNTER — Ambulatory Visit (INDEPENDENT_AMBULATORY_CARE_PROVIDER_SITE_OTHER)
Admission: RE | Admit: 2019-08-16 | Discharge: 2019-08-16 | Disposition: A | Discharge status: Home / Self Care | Payer: HMO | Source: Ambulatory Visit | Attending: Family Medicine | Admitting: Family Medicine

## 2019-08-16 ENCOUNTER — Ambulatory Visit (INDEPENDENT_AMBULATORY_CARE_PROVIDER_SITE_OTHER): Payer: HMO | Admitting: Family Medicine

## 2019-08-16 ENCOUNTER — Other Ambulatory Visit: Payer: Self-pay

## 2019-08-16 ENCOUNTER — Encounter: Payer: Self-pay | Admitting: Family Medicine

## 2019-08-16 VITALS — BP 146/72 | HR 67 | Temp 96.9°F | Ht 69.0 in | Wt 195.1 lb

## 2019-08-16 DIAGNOSIS — G40109 Localization-related (focal) (partial) symptomatic epilepsy and epileptic syndromes with simple partial seizures, not intractable, without status epilepticus: Secondary | ICD-10-CM

## 2019-08-16 DIAGNOSIS — E785 Hyperlipidemia, unspecified: Secondary | ICD-10-CM | POA: Diagnosis not present

## 2019-08-16 DIAGNOSIS — E1169 Type 2 diabetes mellitus with other specified complication: Secondary | ICD-10-CM | POA: Diagnosis not present

## 2019-08-16 DIAGNOSIS — G8929 Other chronic pain: Secondary | ICD-10-CM | POA: Diagnosis not present

## 2019-08-16 DIAGNOSIS — E1151 Type 2 diabetes mellitus with diabetic peripheral angiopathy without gangrene: Secondary | ICD-10-CM

## 2019-08-16 DIAGNOSIS — M545 Low back pain: Secondary | ICD-10-CM

## 2019-08-16 NOTE — Progress Notes (Signed)
Subjective:    Patient ID: Margaret Vazquez, female    DOB: 25-Feb-1936, 84 y.o.   MRN: [id number]  This visit occurred during the SARS-CoV-2 public health emergency.  Safety protocols were in place, including screening questions prior to the visit, additional usage of staff PPE, and extensive cleaning of exam room while observing appropriate contact time as indicated for disinfecting solutions.    HPI Pt presents with L back pain   Wt Readings from Last 3 Encounters:  08/16/19 195 lb 1 oz (88.5 kg)  08/03/19 192 lb 12.8 oz (87.5 kg)  03/23/19 192 lb 1 oz (87.1 kg)   28.81 kg/m   This started last summer  [place] [place] if she bends over and stands up  Takes her breath away (grabs her) Has not caused her to fall  Hurts to lie on the L side   Sharp when it occurs   Walking is ok  No radiates to the leg  No numbness or weakness   Did change her mattress   Patient Active Problem List   Diagnosis Date Noted  . Cryptogenic stroke ([place]) 08/02/2019  . Focal motor seizure ([place]) 03/23/2019  . Breast lump on left side at 10 o'clock position 11/17/2018  . Left-sided epistaxis 11/17/2018  . Medicare annual wellness visit, subsequent 09/13/2018  . Screening mammogram, encounter for 09/13/2018  . Herpes zoster 08/10/2018  . Plantar wart, left foot 06/15/2018  . Atherosclerotic heart disease of native coronary artery with other forms of angina pectoris ([place]) 12/24/2017  . Routine general medical examination at a health care facility 08/24/2016  . PAF (paroxysmal atrial fibrillation) ([place]) 04/07/2016  . Overweight (BMI 25.0-29.9) 08/23/2014  . Type 2 diabetes mellitus with diabetic peripheral angiopathy without gangrene, without long-term current use of insulin ([place]) 01/12/2014  . TIA (transient ischemic attack) 01/11/2014  . CVA (cerebral infarction) 01/11/2014  . LOOP Recorder LINQ 06/07/2013  . Fatigue 06/07/2013  . Sinusitis, chronic 05/16/2013  . H/O: CVA (cerebrovascular accident)  02/15/2013  . Tingling of upper extremity 02/14/2013  . Encounter for Medicare annual wellness exam 10/27/2012  . Encopresis(307.7) 12/15/2011  . Post-menopausal 10/28/2011  . PERSONAL HX COLONIC POLYPS 09/04/2009  . B12 deficiency 03/06/2009  . ALLERGIC RHINITIS 02/14/2008  . BACK PAIN, LUMBAR 11/03/2007  . Hyperlipidemia associated with type 2 diabetes mellitus ([place]) 11/24/2006  . Essential hypertension 11/24/2006  . GERD 10/09/2006  . OVERACTIVE BLADDER 10/09/2006  . INCONTINENCE, URGE 10/09/2006  . SKIN CANCER, HX OF 10/09/2006   Past Medical History:  Diagnosis Date  . Allergy history, drug    Aspirin  . Basal cell carcinoma    "back"  . CAD (coronary artery disease)    a. Previously nonobstructive then progressive angina with abnl CT -> cardiac cath 12/24/17 showed 30% mid RCA and 80% prox-mid Cx. She received DES to mid AV groove Cx. EF 55-65%.   . Cervical stenosis of spine    With neck pain  . CKD (chronic kidney disease), stage II   . Colon polyps 2009  . Complication of anesthesia 1980s   slow to wake after anesthesia "when I had breast biopsy"  . GERD (gastroesophageal reflux disease)   . Hyperlipidemia    myalgias with Lipitor and Zetia  . Hypertension   . Migraine    "stopped at age 11" (12/24/2017)  . Squamous carcinoma    "iced off and cut off; mostly arms" (12/24/2017)  . Stroke [facility])    "told me I'd had 2 strokes in  02/2013"; denies residual on 12/24/2017  . TIA (transient ischemic attack)   . Type II diabetes mellitus ([place])    Past Surgical History:  Procedure Laterality Date  . ABD [place]  07/2003   Negative  . APPENDECTOMY    . BASAL CELL CARCINOMA EXCISION     "back"  . BREAST BIOPSY Left 1992   benign  . CARDIAC CATHETERIZATION  04/07/2011   non obst CAD (Dr Burt Knack)  . CATARACT EXTRACTION W/PHACO Left 04/01/2017   Procedure: CATARACT EXTRACTION PHACO AND INTRAOCULAR LENS PLACEMENT ([place]) LEFT DIABETIC;  Surgeon: Leandrew Koyanagi, MD;  Location:  [place];  Service: Ophthalmology;  Laterality: Left;  Diabetic - oral meds  . CATARACT EXTRACTION W/PHACO Right 04/29/2017   Procedure: CATARACT EXTRACTION PHACO AND INTRAOCULAR LENS PLACEMENT ([place]) RIGHT DIABETIC;  Surgeon: Leandrew Koyanagi, MD;  Location: [place];  Service: Ophthalmology;  Laterality: Right;  Diabetic - oral meds  . COLONOSCOPY  12/2007   Adenomatous colon polyps  . CORONARY ANGIOPLASTY WITH STENT PLACEMENT  12/24/2017  . CORONARY STENT INTERVENTION N/A 12/24/2017   Procedure: CORONARY STENT INTERVENTION;  Surgeon: Lorretta Harp, MD;  Location: [place] CV LAB;  Service: Cardiovascular;  Laterality: N/A;  . CYSTOSCOPY W/ DECANNULATION  03/2000   Normal  . LEFT HEART CATH AND CORONARY ANGIOGRAPHY N/A 12/24/2017   Procedure: LEFT HEART CATH AND CORONARY ANGIOGRAPHY;  Surgeon: Lorretta Harp, MD;  Location: [place] CV LAB;  Service: Cardiovascular;  Laterality: N/A;  . LOOP RECORDER IMPLANT N/A 02/16/2013   Procedure: LOOP RECORDER IMPLANT;  Surgeon: Deboraha Sprang, MD;  Location: [facility] CATH LAB;  Service: Cardiovascular;  Laterality: N/A;  . NASAL SINUS SURGERY  01/2005  . SQUAMOUS CELL CARCINOMA EXCISION     "mostly arms;" (12/24/2017)  . STRESS CARDIOLITE  11/1999   Normal/ negative  . TEAR DUCT PROBING  2005   "? side"  . TEE WITHOUT CARDIOVERSION N/A 02/16/2013   Procedure: TRANSESOPHAGEAL ECHOCARDIOGRAM (TEE);  Surgeon: Josue Hector, MD;  Location: [facility] ENDOSCOPY;  Service: Cardiovascular;  Laterality: N/A;  . TUBAL LIGATION     BTL   Social History   Tobacco Use  . Smoking status: Never Smoker  . Smokeless tobacco: Never Used  Substance Use Topics  . Alcohol use: Never    Alcohol/week: 0.0 standard drinks  . Drug use: Never   Family History  Problem Relation Age of Onset  . Lung cancer Brother   . Diabetes Brother   . Pancreatic cancer Brother   . Heart disease Mother   . Heart disease Father   . Brain cancer Other     . Skin cancer Daughter   . Diabetes Sister   . Breast cancer Sister   . Colon cancer Neg Hx    Allergies  Allergen Reactions  . Bee Venom Hives, Shortness Of Breath and Swelling  . Nabumetone Anaphylaxis  . Amoxicillin-Pot Clavulanate Hives and Swelling    To lips.  . Aspirin Hives  . Atorvastatin Swelling     joint pain/swelling, inc liver tests  . Clopidogrel Bisulfate Hives  . Codeine Nausea And Vomiting  . Ezetimibe Other (See Comments)     fatigue  . Metformin And Related Other (See Comments)    Diarrhea   . Valsartan Other (See Comments)     fatigue  . Other Hives    **Red Meat**  SOB   Current Outpatient Medications on File Prior to Visit  Medication Sig Dispense Refill  .  Blood Glucose Monitoring Suppl (ONE TOUCH ULTRA 2) w/Device KIT Use to check blood sugar once daily 1 each 0  . BRILINTA 90 MG TABS tablet TAKE 1 TABLET BY MOUTH TWICE DAILY 60 tablet 8  . diphenhydrAMINE (BENADRYL) 25 MG tablet Take 50 mg by mouth every 8 (eight) hours as needed for allergies.     Marland Kitchen EPINEPHrine (EPI-PEN) 0.3 mg/0.3 mL DEVI Inject 0.3 mg into the muscle daily as needed (allergic reaction).     Marland Kitchen erythromycin ophthalmic ointment     . fluticasone (FLONASE) 50 MCG/ACT nasal spray Place 2 sprays into both nostrils 2 (two) times daily as needed. Reported on 09/10/2015    . glipiZIDE (GLUCOTROL XL) 10 MG 24 hr tablet Take 1 tablet (10 mg total) by mouth daily with breakfast. 90 tablet 3  . isosorbide mononitrate (IMDUR) 60 MG 24 hr tablet Take 1 tablet (60 mg total) by mouth daily. 90 tablet 3  . losartan-hydrochlorothiazide (HYZAAR) 50-12.5 MG tablet Take 0.5 tablets by mouth daily. 45 tablet 3  . metFORMIN (GLUCOPHAGE-XR) 500 MG 24 hr tablet TAKE 1 TABLET(500 MG) BY MOUTH DAILY WITH BREAKFAST 90 tablet 3  . nitroGLYCERIN (NITROSTAT) 0.4 MG SL tablet Place 1 tablet (0.4 mg total) under the tongue every 5 (five) minutes as needed for chest pain (up to 3 doses. If taking 3rd dose call 911). 25  tablet 3  . potassium chloride SA (K-DUR,KLOR-CON) 20 MEQ tablet Take 1 tablet (20 mEq total) by mouth daily. 90 tablet 3   No current facility-administered medications on file prior to visit.     Review of Systems  Constitutional: Negative for activity change, appetite change, fatigue, fever and unexpected weight change.  HENT: Negative for congestion, ear pain, rhinorrhea, sinus pressure and sore throat.   Eyes: Negative for pain, redness and visual disturbance.  Respiratory: Negative for cough, shortness of breath and wheezing.   Cardiovascular: Negative for chest pain and palpitations.  Gastrointestinal: Negative for abdominal pain, blood in stool, constipation and diarrhea.  Endocrine: Negative for polydipsia and polyuria.  Genitourinary: Negative for dysuria, frequency and urgency.  Musculoskeletal: Positive for arthralgias and back pain. Negative for joint swelling and myalgias.  Skin: Negative for pallor and rash.  Allergic/Immunologic: Negative for environmental allergies.  Neurological: Negative for dizziness, syncope and headaches.  Hematological: Negative for adenopathy. Does not bruise/bleed easily.  Psychiatric/Behavioral: Negative for decreased concentration and dysphoric mood. The patient is not nervous/anxious.        Objective:   Physical Exam Constitutional:      General: She is not in acute distress.    Appearance: Normal appearance. She is well-developed. She is not ill-appearing or diaphoretic.  HENT:     Head: Normocephalic and atraumatic.  Eyes:     General: No scleral icterus.    Conjunctiva/sclera: Conjunctivae normal.     Pupils: Pupils are equal, round, and reactive to light.  Cardiovascular:     Rate and Rhythm: Normal rate and regular rhythm.  Pulmonary:     Effort: Pulmonary effort is normal.     Breath sounds: Normal breath sounds. No wheezing or rales.  Abdominal:     General: Bowel sounds are normal. There is no distension.     Palpations:  Abdomen is soft.     Tenderness: There is no abdominal tenderness.  Musculoskeletal:        General: Tenderness present.     Cervical back: Normal range of motion and neck supple.     Lumbar back:  Spasms and tenderness present. No edema or bony tenderness. Decreased range of motion. Negative right straight leg raise test and negative left straight leg raise test. No scoliosis.     Comments: L lumbar spasm with muscular tenderness and loss of lordosis  No bony tenderness Nl rom hips  No pain with SLR Gait is normal   (it does not catch her here) L lateral bend illicits pain   Lymphadenopathy:     Cervical: No cervical adenopathy.  Skin:    General: Skin is warm and dry.     Coloration: Skin is not pale.     Findings: No erythema or rash.  Neurological:     Mental Status: She is alert.     Cranial Nerves: No cranial nerve deficit.     Sensory: No sensory deficit.     Motor: No atrophy or abnormal muscle tone.     Coordination: Coordination normal.     Deep Tendon Reflexes: Reflexes are normal and symmetric. Reflexes normal.     Comments: Negative SLR  Psychiatric:        Mood and Affect: Mood normal.           Assessment & Plan:   Problem List Items Addressed This Visit      Cardiovascular and Mediastinum   Type 2 diabetes mellitus with diabetic peripheral angiopathy without gangrene, without long-term current use of insulin ([place])    Lab Results  Component Value Date   HGBA1C 6.1 (A) 03/23/2019   No clinical changes        Endocrine   Hyperlipidemia associated with type 2 diabetes mellitus ([place])    Intolerant of statins  Disc goals for lipids and reasons to control them Rev last labs with pt Rev low sat fat diet in detail         Nervous and Auditory   Focal motor seizure ([place])    No seizures since last visit        Other   BACK PAIN, LUMBAR - Primary    Acute on chronic  L lumbar muscular tenderness and pain with L lat bend  No neuro changes Xray  today in light of length of symptoms Recommend trial of acetaminophen at bedtime Muscle rub otc with massage prn  Pend rad rev to adv further  Suspect PT would help      Relevant Orders   DG Lumbar Spine Complete

## 2019-08-16 NOTE — Assessment & Plan Note (Signed)
No seizures since last visit

## 2019-08-16 NOTE — Assessment & Plan Note (Signed)
Lab Results  Component Value Date   HGBA1C 6.1 (A) 03/23/2019   No clinical changes

## 2019-08-16 NOTE — Assessment & Plan Note (Addendum)
Acute on chronic  Fleeting -"catches" her with certain movements or when lying on that side L lumbar muscular tenderness and pain with L lat bend  No neuro changes Xray today in light of length of symptoms Recommend trial of acetaminophen at bedtime Muscle rub otc with massage prn  Pend rad rev to adv further  Suspect PT would help

## 2019-08-16 NOTE — Patient Instructions (Addendum)
Let's get an xray of your low back  It sounds like you are having some spasm -when in certain positions (muscle irritability)     Walking should help  A muscle rub like icy hot/ben gay may help also  Tylenol at bed time is ok as well   Avoid positions that make it worse

## 2019-08-16 NOTE — Assessment & Plan Note (Signed)
Intolerant of statins  Disc goals for lipids and reasons to control them Rev last labs with pt Rev low sat fat diet in detail

## 2019-08-17 ENCOUNTER — Telehealth: Payer: Self-pay | Admitting: Family Medicine

## 2019-08-17 DIAGNOSIS — G8929 Other chronic pain: Secondary | ICD-10-CM

## 2019-08-17 NOTE — Telephone Encounter (Signed)
I'm hoping that PT can improve muscle tone and flexibility to stop the spasms (that are causing her "catching" back pain that occurs when she moves certain ways  Will send to [facility]

## 2019-08-17 NOTE — Telephone Encounter (Signed)
Pt notified of Dr. Marliss Coots comments. Pt will go to PT if possible in [place].

## 2019-08-17 NOTE — Telephone Encounter (Signed)
-----   Message from Tammi Sou, [place] sent at 08/17/2019 11:21 AM EST ----- Pt notified of Dr. Marliss Coots comments. She said she didn't understand the mychart message and what everything meant. I tried to explain to pt but she wasn't sure how is PT going to help and how is the progressing of this going to happen. Pt then asked is PT covered by insurance, I advise pt she should call her insurance to verify coverage. Pt then asked me do I think the PT office is clean because she doesn't want to catch covid. I advise her most doctor's offices have a procedure of cleaning everything down but she can always call ahead of her appt to ask them what their cleaning policy is. Pt said she had some questions about her Xray, I asked if she wanted me to relay her questions to Dr. Glori Bickers and she said no she will just wait until her CPE next month. Pt said she is hesitant to go to PT but if Dr. Glori Bickers thinks that she needs to go to PT then she will do it. Pt agreed to PT

## 2019-08-17 NOTE — Telephone Encounter (Signed)
PT scheduled and patient aware

## 2019-08-24 DIAGNOSIS — R2689 Other abnormalities of gait and mobility: Secondary | ICD-10-CM | POA: Diagnosis not present

## 2019-08-24 DIAGNOSIS — M545 Low back pain: Secondary | ICD-10-CM | POA: Diagnosis not present

## 2019-08-29 DIAGNOSIS — C44629 Squamous cell carcinoma of skin of left upper limb, including shoulder: Secondary | ICD-10-CM | POA: Diagnosis not present

## 2019-08-29 DIAGNOSIS — M545 Low back pain: Secondary | ICD-10-CM | POA: Diagnosis not present

## 2019-08-29 DIAGNOSIS — Z85828 Personal history of other malignant neoplasm of skin: Secondary | ICD-10-CM | POA: Diagnosis not present

## 2019-08-29 DIAGNOSIS — D2262 Melanocytic nevi of left upper limb, including shoulder: Secondary | ICD-10-CM | POA: Diagnosis not present

## 2019-08-29 DIAGNOSIS — X32XXXA Exposure to sunlight, initial encounter: Secondary | ICD-10-CM | POA: Diagnosis not present

## 2019-08-29 DIAGNOSIS — D2271 Melanocytic nevi of right lower limb, including hip: Secondary | ICD-10-CM | POA: Diagnosis not present

## 2019-08-29 DIAGNOSIS — L570 Actinic keratosis: Secondary | ICD-10-CM | POA: Diagnosis not present

## 2019-08-29 DIAGNOSIS — D2261 Melanocytic nevi of right upper limb, including shoulder: Secondary | ICD-10-CM | POA: Diagnosis not present

## 2019-08-29 DIAGNOSIS — L298 Other pruritus: Secondary | ICD-10-CM | POA: Diagnosis not present

## 2019-08-29 DIAGNOSIS — D485 Neoplasm of uncertain behavior of skin: Secondary | ICD-10-CM | POA: Diagnosis not present

## 2019-08-29 DIAGNOSIS — L820 Inflamed seborrheic keratosis: Secondary | ICD-10-CM | POA: Diagnosis not present

## 2019-08-29 DIAGNOSIS — L538 Other specified erythematous conditions: Secondary | ICD-10-CM | POA: Diagnosis not present

## 2019-08-29 DIAGNOSIS — R2689 Other abnormalities of gait and mobility: Secondary | ICD-10-CM | POA: Diagnosis not present

## 2019-09-01 ENCOUNTER — Telehealth: Payer: Self-pay | Admitting: Family Medicine

## 2019-09-01 DIAGNOSIS — M545 Low back pain: Secondary | ICD-10-CM | POA: Diagnosis not present

## 2019-09-01 DIAGNOSIS — R2689 Other abnormalities of gait and mobility: Secondary | ICD-10-CM | POA: Diagnosis not present

## 2019-09-01 NOTE — Telephone Encounter (Signed)
Unless she has had an anaphylactic reaction to a vaccine in the past (or is allergic to any of the components of the covid vaccine she plans to get) it is recommended Since she has had allergic reactions to other things (including oral medications) - the testing center should make her wait for about 30 minutes after her injection  This is my understanding

## 2019-09-01 NOTE — Telephone Encounter (Signed)
Pt wants to know if you think it is safe for her to receive the covid vaccine. She states she has heard both sides stating she should and should not and she wanted your input.

## 2019-09-02 NOTE — Telephone Encounter (Signed)
Pt notified of Dr. Marliss Coots comments and recommendations and she verbalized understanding

## 2019-09-05 DIAGNOSIS — R2689 Other abnormalities of gait and mobility: Secondary | ICD-10-CM | POA: Diagnosis not present

## 2019-09-05 DIAGNOSIS — M545 Low back pain: Secondary | ICD-10-CM | POA: Diagnosis not present

## 2019-09-06 DIAGNOSIS — Z9889 Other specified postprocedural states: Secondary | ICD-10-CM | POA: Insufficient documentation

## 2019-09-08 ENCOUNTER — Telehealth (INDEPENDENT_AMBULATORY_CARE_PROVIDER_SITE_OTHER): Payer: HMO | Admitting: Internal Medicine

## 2019-09-08 ENCOUNTER — Other Ambulatory Visit: Payer: Self-pay

## 2019-09-08 VITALS — BP 125/62 | HR 64 | Ht 69.0 in | Wt 198.0 lb

## 2019-09-08 DIAGNOSIS — R2689 Other abnormalities of gait and mobility: Secondary | ICD-10-CM | POA: Diagnosis not present

## 2019-09-08 DIAGNOSIS — I2510 Atherosclerotic heart disease of native coronary artery without angina pectoris: Secondary | ICD-10-CM | POA: Diagnosis not present

## 2019-09-08 DIAGNOSIS — I639 Cerebral infarction, unspecified: Secondary | ICD-10-CM

## 2019-09-08 DIAGNOSIS — I10 Essential (primary) hypertension: Secondary | ICD-10-CM | POA: Diagnosis not present

## 2019-09-08 DIAGNOSIS — M545 Low back pain: Secondary | ICD-10-CM | POA: Diagnosis not present

## 2019-09-08 DIAGNOSIS — Z9889 Other specified postprocedural states: Secondary | ICD-10-CM | POA: Diagnosis not present

## 2019-09-08 DIAGNOSIS — Z8673 Personal history of transient ischemic attack (TIA), and cerebral infarction without residual deficits: Secondary | ICD-10-CM

## 2019-09-08 DIAGNOSIS — E785 Hyperlipidemia, unspecified: Secondary | ICD-10-CM

## 2019-09-08 DIAGNOSIS — I480 Paroxysmal atrial fibrillation: Secondary | ICD-10-CM

## 2019-09-08 NOTE — Patient Instructions (Addendum)
Medication Instructions:  Your physician recommends that you continue on your current medications as directed. Please refer to the Current Medication list given to you today.  *If you need a refill on your cardiac medications before your next appointment, please call your pharmacy*   Lab Work: None ordered.  If you have labs (blood work) drawn today and your tests are completely normal, you will receive your results only by: Marland Kitchen MyChart Message (if you have MyChart) OR . A paper copy in the mail If you have any lab test that is abnormal or we need to change your treatment, we will call you to review the results.   Testing/Procedures: None ordered.    Follow-Up: At [facility], you and your health needs are our priority.  As part of our continuing mission to provide you with exceptional heart care, we have created designated Provider Care Teams.  These Care Teams include your primary Cardiologist (physician) and Advanced Practice Providers (APPs -  Physician Assistants and Nurse Practitioners) who all work together to provide you with the care you need, when you need it.  We recommend signing up for the patient portal called "MyChart".  Sign up information is provided on this After Visit Summary.  MyChart is used to connect with patients for Virtual Visits (Telemedicine).  Patients are able to view lab/test results, encounter notes, upcoming appointments, etc.  Non-urgent messages can be sent to your provider as well.   To learn more about what you can do with MyChart, go to [email or web address].    Your next appointment:   4 months with APP.  You will receive a letter reminding you to schedule this visit.  The format for your next appointment: in person

## 2019-09-08 NOTE — Progress Notes (Signed)
Electrophysiology TeleHealth Note   Due to national recommendations of social distancing due to COVID 19, an audio/video telehealth visit is felt to be most appropriate for this patient at this time.  See MyChart message from today for the patient's consent to telehealth for [facility].   Date:  09/08/2019   ID:  Margaret Vazquez, DOB 1935-09-23, MRN [id number]  Location: patient's home  Provider location: [address] [place]  Evaluation Performed: Follow-up visit  PCP:  Abner Greenspan, MD  Cardiologist:     Electrophysiologist:  SK   Chief Complaint:  angina  History of Present Illness:    Margaret Vazquez is a 84 y.o. female who presents via audio/video conferencing for a telehealth visit today.  Since last being seen in our clinic for Cryptogenic Stroke and new onset angina for which with known CAD, statin intolerant the patient reports feeling better with less angina and shortness of breath; no edema or PND  No palps  No lightheadedness or headache  Covid vaccine 1    The patient denies symptoms of fevers, chills, cough, or new SOB worrisome for COVID 19.   Past Medical History:  Diagnosis Date  . Allergy history, drug    Aspirin  . Basal cell carcinoma    "back"  . CAD (coronary artery disease)    a. Previously nonobstructive then progressive angina with abnl CT -> cardiac cath 12/24/17 showed 30% mid RCA and 80% prox-mid Cx. She received DES to mid AV groove Cx. EF 55-65%.   . Cervical stenosis of spine    With neck pain  . CKD (chronic kidney disease), stage II   . Colon polyps 2009  . Complication of anesthesia 1980s   slow to wake after anesthesia "when I had breast biopsy"  . GERD (gastroesophageal reflux disease)   . Hyperlipidemia    myalgias with Lipitor and Zetia  . Hypertension   . Migraine    "stopped at age 28" (12/24/2017)  . Squamous carcinoma    "iced off and cut off; mostly arms" (12/24/2017)  . Stroke [facility])    "told me I'd  had 2 strokes in 02/2013"; denies residual on 12/24/2017  . TIA (transient ischemic attack)   . Type II diabetes mellitus ([place])     Past Surgical History:  Procedure Laterality Date  . ABD [place]  07/2003   Negative  . APPENDECTOMY    . BASAL CELL CARCINOMA EXCISION     "back"  . BREAST BIOPSY Left 1992   benign  . CARDIAC CATHETERIZATION  04/07/2011   non obst CAD (Dr Burt Knack)  . CATARACT EXTRACTION W/PHACO Left 04/01/2017   Procedure: CATARACT EXTRACTION PHACO AND INTRAOCULAR LENS PLACEMENT ([place]) LEFT DIABETIC;  Surgeon: Leandrew Koyanagi, MD;  Location: [place];  Service: Ophthalmology;  Laterality: Left;  Diabetic - oral meds  . CATARACT EXTRACTION W/PHACO Right 04/29/2017   Procedure: CATARACT EXTRACTION PHACO AND INTRAOCULAR LENS PLACEMENT ([place]) RIGHT DIABETIC;  Surgeon: Leandrew Koyanagi, MD;  Location: [place];  Service: Ophthalmology;  Laterality: Right;  Diabetic - oral meds  . COLONOSCOPY  12/2007   Adenomatous colon polyps  . CORONARY ANGIOPLASTY WITH STENT PLACEMENT  12/24/2017  . CORONARY STENT INTERVENTION N/A 12/24/2017   Procedure: CORONARY STENT INTERVENTION;  Surgeon: Lorretta Harp, MD;  Location: [place] CV LAB;  Service: Cardiovascular;  Laterality: N/A;  . CYSTOSCOPY W/ DECANNULATION  03/2000   Normal  . LEFT HEART CATH AND CORONARY  ANGIOGRAPHY N/A 12/24/2017   Procedure: LEFT HEART CATH AND CORONARY ANGIOGRAPHY;  Surgeon: Lorretta Harp, MD;  Location: [place] CV LAB;  Service: Cardiovascular;  Laterality: N/A;  . LOOP RECORDER IMPLANT N/A 02/16/2013   Procedure: LOOP RECORDER IMPLANT;  Surgeon: Deboraha Sprang, MD;  Location: [facility] CATH LAB;  Service: Cardiovascular;  Laterality: N/A;  . NASAL SINUS SURGERY  01/2005  . SQUAMOUS CELL CARCINOMA EXCISION     "mostly arms;" (12/24/2017)  . STRESS CARDIOLITE  11/1999   Normal/ negative  . TEAR DUCT PROBING  2005   "? side"  . TEE WITHOUT CARDIOVERSION N/A 02/16/2013   Procedure:  TRANSESOPHAGEAL ECHOCARDIOGRAM (TEE);  Surgeon: Josue Hector, MD;  Location: [facility] ENDOSCOPY;  Service: Cardiovascular;  Laterality: N/A;  . TUBAL LIGATION     BTL    Current Outpatient Medications  Medication Sig Dispense Refill  . Blood Glucose Monitoring Suppl (ONE TOUCH ULTRA 2) w/Device KIT Use to check blood sugar once daily 1 each 0  . BRILINTA 90 MG TABS tablet TAKE 1 TABLET BY MOUTH TWICE DAILY 60 tablet 8  . diphenhydrAMINE (BENADRYL) 25 MG tablet Take 50 mg by mouth every 8 (eight) hours as needed for allergies.     Marland Kitchen EPINEPHrine (EPI-PEN) 0.3 mg/0.3 mL DEVI Inject 0.3 mg into the muscle daily as needed (allergic reaction).     Marland Kitchen erythromycin ophthalmic ointment     . fluticasone (FLONASE) 50 MCG/ACT nasal spray Place 2 sprays into both nostrils 2 (two) times daily as needed. Reported on 09/10/2015    . glipiZIDE (GLUCOTROL XL) 10 MG 24 hr tablet Take 1 tablet (10 mg total) by mouth daily with breakfast. 90 tablet 3  . isosorbide mononitrate (IMDUR) 60 MG 24 hr tablet Take 1 tablet (60 mg total) by mouth daily. 90 tablet 3  . losartan-hydrochlorothiazide (HYZAAR) 50-12.5 MG tablet Take 0.5 tablets by mouth daily. 45 tablet 3  . metFORMIN (GLUCOPHAGE-XR) 500 MG 24 hr tablet TAKE 1 TABLET(500 MG) BY MOUTH DAILY WITH BREAKFAST 90 tablet 3  . nitroGLYCERIN (NITROSTAT) 0.4 MG SL tablet Place 1 tablet (0.4 mg total) under the tongue every 5 (five) minutes as needed for chest pain (up to 3 doses. If taking 3rd dose call 911). 25 tablet 3  . potassium chloride SA (K-DUR,KLOR-CON) 20 MEQ tablet Take 1 tablet (20 mEq total) by mouth daily. 90 tablet 3   No current facility-administered medications for this visit.    Allergies:   Bee venom, Nabumetone, Amoxicillin-pot clavulanate, Aspirin, Atorvastatin, Clopidogrel bisulfate, Codeine, Ezetimibe, Metformin and related, Valsartan, and Other   Social History:  The patient  reports that she has never smoked. She has never used smokeless tobacco.  She reports that she does not drink alcohol or use drugs.   Family History:  The patient's   family history includes Brain cancer in an other family member; Breast cancer in her sister; Diabetes in her brother and sister; Heart disease in her father and mother; Lung cancer in her brother; Pancreatic cancer in her brother; Skin cancer in her daughter.   ROS:  Please see the history of present illness.   All other systems are personally reviewed and negative.    Exam:    Vital Signs:  BP 125/62   Pulse 64   Ht '5\' 9"'$  (1.753 m)   Wt 198 lb (89.8 kg)   BMI 29.24 kg/m        Labs/Other Tests and Data Reviewed:    Recent Labs:  09/09/2018: TSH 4.46 09/12/2018: Hemoglobin 13.5; Platelets 241 03/23/2019: ALT 14; BUN 16; Creatinine, Ser 0.93; Potassium 4.0; Sodium 140   Wt Readings from Last 3 Encounters:  09/08/19 198 lb (89.8 kg)  08/16/19 195 lb 1 oz (88.5 kg)  08/03/19 192 lb 12.8 oz (87.5 kg)     Other studies personally reviewed:     ASSESSMENT & PLAN:   Cryptogenic stroke  Implantable loop recorder end of service  Elevated blood pressure  Hyperlipidemia  Carotid disease  Coronary disease s/p Cx stenting  Exertional chest pain consistent with angina-new   Angina and dyspnea much improved on the imdur  BP well controlled with less LH seen with diastolics in the 02V   No intercurrent atrial fibrillation or flutter   Will need to consider PCSK9 therapy    COVID 19 screen The patient denies symptoms of COVID 19 at this time.  The importance of social distancing was discussed today.  Follow-up:  63m OV  App    Current medicines are reviewed at length with the patient today.   The patient does not have concerns regarding her medicines.  The following changes were made today:  none  Labs/ tests ordered today include:   No orders of the defined types were placed in this encounter.   Future tests ( post COVID )    Patient Risk:  after full review of this  patients clinical status, I feel that they are at moderate risk at this time.  Today, I have spent 6 minutes with the patient with telehealth technology discussing the above.  Signed, SVirl Axe MD  09/08/2019 3:11 PM     [phone](office) [phone](fax)

## 2019-09-12 DIAGNOSIS — M545 Low back pain: Secondary | ICD-10-CM | POA: Diagnosis not present

## 2019-09-12 DIAGNOSIS — R2689 Other abnormalities of gait and mobility: Secondary | ICD-10-CM | POA: Diagnosis not present

## 2019-09-13 ENCOUNTER — Telehealth: Payer: Self-pay | Admitting: Family Medicine

## 2019-09-13 DIAGNOSIS — E1151 Type 2 diabetes mellitus with diabetic peripheral angiopathy without gangrene: Secondary | ICD-10-CM

## 2019-09-13 DIAGNOSIS — E1169 Type 2 diabetes mellitus with other specified complication: Secondary | ICD-10-CM

## 2019-09-13 DIAGNOSIS — I10 Essential (primary) hypertension: Secondary | ICD-10-CM

## 2019-09-13 DIAGNOSIS — E785 Hyperlipidemia, unspecified: Secondary | ICD-10-CM

## 2019-09-13 DIAGNOSIS — E538 Deficiency of other specified B group vitamins: Secondary | ICD-10-CM

## 2019-09-13 NOTE — Telephone Encounter (Signed)
-----   Message from Ellamae Sia sent at 09/01/2019  3:40 PM EST ----- Regarding: lab orders for Wednesday, 3.10.21 Patient is scheduled for CPX labs, please order future labs, Thanks , Karna Christmas

## 2019-09-14 ENCOUNTER — Other Ambulatory Visit: Payer: Self-pay

## 2019-09-14 ENCOUNTER — Other Ambulatory Visit (INDEPENDENT_AMBULATORY_CARE_PROVIDER_SITE_OTHER): Payer: HMO

## 2019-09-14 ENCOUNTER — Other Ambulatory Visit: Payer: Self-pay | Admitting: *Deleted

## 2019-09-14 DIAGNOSIS — E538 Deficiency of other specified B group vitamins: Secondary | ICD-10-CM

## 2019-09-14 DIAGNOSIS — E1169 Type 2 diabetes mellitus with other specified complication: Secondary | ICD-10-CM | POA: Diagnosis not present

## 2019-09-14 DIAGNOSIS — E785 Hyperlipidemia, unspecified: Secondary | ICD-10-CM | POA: Diagnosis not present

## 2019-09-14 DIAGNOSIS — I10 Essential (primary) hypertension: Secondary | ICD-10-CM

## 2019-09-14 DIAGNOSIS — E1151 Type 2 diabetes mellitus with diabetic peripheral angiopathy without gangrene: Secondary | ICD-10-CM | POA: Diagnosis not present

## 2019-09-14 LAB — COMPREHENSIVE METABOLIC PANEL
ALT: 12 U/L (ref 0–35)
AST: 13 U/L (ref 0–37)
Albumin: 3.8 g/dL (ref 3.5–5.2)
Alkaline Phosphatase: 69 U/L (ref 39–117)
BUN: 15 mg/dL (ref 6–23)
CO2: 30 mEq/L (ref 19–32)
Calcium: 9.2 mg/dL (ref 8.4–10.5)
Chloride: 105 mEq/L (ref 96–112)
Creatinine, Ser: 0.94 mg/dL (ref 0.40–1.20)
GFR: 57.02 mL/min — ABNORMAL LOW (ref >60.00)
Glucose, Bld: 103 mg/dL — ABNORMAL HIGH (ref 70–99)
Potassium: 3.9 mEq/L (ref 3.5–5.1)
Sodium: 140 mEq/L (ref 135–145)
Total Bilirubin: 0.5 mg/dL (ref 0.2–1.2)
Total Protein: 7 g/dL (ref 6.0–8.3)

## 2019-09-14 LAB — CBC WITH DIFFERENTIAL/PLATELET
Basophils Absolute: 0.1 10*3/uL (ref 0.0–0.1)
Basophils Relative: 0.9 % (ref 0.0–3.0)
Eosinophils Absolute: 0.3 10*3/uL (ref 0.0–0.7)
Eosinophils Relative: 5.4 % — ABNORMAL HIGH (ref 0.0–5.0)
HCT: 41.4 % (ref 36.0–46.0)
Hemoglobin: 13.7 g/dL (ref 12.0–15.0)
Lymphocytes Relative: 34.9 % (ref 12.0–46.0)
Lymphs Abs: 2.1 10*3/uL (ref 0.7–4.0)
MCHC: 33 g/dL (ref 30.0–36.0)
MCV: 96.6 fl (ref 78.0–100.0)
Monocytes Absolute: 0.4 10*3/uL (ref 0.1–1.0)
Monocytes Relative: 7.1 % (ref 3.0–12.0)
Neutro Abs: 3.2 10*3/uL (ref 1.4–7.7)
Neutrophils Relative %: 51.7 % (ref 43.0–77.0)
Platelets: 221 10*3/uL (ref 150.0–400.0)
RBC: 4.29 Mil/uL (ref 3.87–5.11)
RDW: 13.6 % (ref 11.5–15.5)
WBC: 6.1 10*3/uL (ref 4.0–10.5)

## 2019-09-14 LAB — LIPID PANEL
Cholesterol: 185 mg/dL (ref 0–200)
HDL: 47.6 mg/dL (ref >39.00)
LDL Cholesterol: 114 mg/dL — ABNORMAL HIGH (ref 0–99)
NonHDL: 137.49
Total CHOL/HDL Ratio: 4
Triglycerides: 116 mg/dL (ref 0.0–149.0)
VLDL: 23.2 mg/dL (ref 0.0–40.0)

## 2019-09-14 LAB — VITAMIN B12: Vitamin B-12: 681 pg/mL (ref 211–911)

## 2019-09-14 LAB — HEMOGLOBIN A1C: Hgb A1c MFr Bld: 6.4 % (ref 4.6–6.5)

## 2019-09-14 LAB — TSH: TSH: 4.1 u[IU]/mL (ref 0.35–4.50)

## 2019-09-14 MED ORDER — GLIPIZIDE ER 10 MG PO TB24: 10.0000 mg | ORAL_TABLET | Freq: Every day | ORAL | 1 refills | Status: DC

## 2019-09-16 DIAGNOSIS — R2689 Other abnormalities of gait and mobility: Secondary | ICD-10-CM | POA: Diagnosis not present

## 2019-09-16 DIAGNOSIS — M545 Low back pain: Secondary | ICD-10-CM | POA: Diagnosis not present

## 2019-09-21 ENCOUNTER — Encounter: Payer: Self-pay | Admitting: Family Medicine

## 2019-09-21 ENCOUNTER — Ambulatory Visit (INDEPENDENT_AMBULATORY_CARE_PROVIDER_SITE_OTHER): Payer: HMO | Admitting: Family Medicine

## 2019-09-21 ENCOUNTER — Other Ambulatory Visit: Payer: Self-pay

## 2019-09-21 VITALS — BP 124/70 | HR 66 | Temp 96.9°F | Ht 68.0 in | Wt 193.1 lb

## 2019-09-21 DIAGNOSIS — Z85828 Personal history of other malignant neoplasm of skin: Secondary | ICD-10-CM

## 2019-09-21 DIAGNOSIS — E663 Overweight: Secondary | ICD-10-CM

## 2019-09-21 DIAGNOSIS — E1169 Type 2 diabetes mellitus with other specified complication: Secondary | ICD-10-CM

## 2019-09-21 DIAGNOSIS — E538 Deficiency of other specified B group vitamins: Secondary | ICD-10-CM

## 2019-09-21 DIAGNOSIS — E1151 Type 2 diabetes mellitus with diabetic peripheral angiopathy without gangrene: Secondary | ICD-10-CM

## 2019-09-21 DIAGNOSIS — I10 Essential (primary) hypertension: Secondary | ICD-10-CM | POA: Diagnosis not present

## 2019-09-21 DIAGNOSIS — Z0000 Encounter for general adult medical examination without abnormal findings: Secondary | ICD-10-CM | POA: Diagnosis not present

## 2019-09-21 DIAGNOSIS — I480 Paroxysmal atrial fibrillation: Secondary | ICD-10-CM | POA: Diagnosis not present

## 2019-09-21 DIAGNOSIS — E785 Hyperlipidemia, unspecified: Secondary | ICD-10-CM

## 2019-09-21 MED ORDER — POTASSIUM CHLORIDE CRYS ER 20 MEQ PO TBCR: 20.0000 meq | EXTENDED_RELEASE_TABLET | Freq: Every day | ORAL | 3 refills | Status: DC

## 2019-09-21 MED ORDER — GLIPIZIDE ER 10 MG PO TB24: 10.0000 mg | ORAL_TABLET | Freq: Every day | ORAL | 3 refills | Status: DC

## 2019-09-21 MED ORDER — METFORMIN HCL ER 500 MG PO TB24: ORAL_TABLET | ORAL | 3 refills | Status: DC

## 2019-09-21 NOTE — Assessment & Plan Note (Signed)
No clinical changes Continues cardiology care  

## 2019-09-21 NOTE — Patient Instructions (Addendum)
Try to get 1200-1500 mg of calcium per day with at least 1000 iu of vitamin D - for bone health   Please work on your advance directive   For cholesterol Avoid red meat/ fried foods/ egg yolks/ fatty breakfast meats/ butter, cheese and high fat dairy/ and shellfish    Stay active (mind and body)  Labs are fairly stable No medicine changes

## 2019-09-21 NOTE — Assessment & Plan Note (Signed)
Reviewed health habits including diet and exercise and skin cancer prevention Reviewed appropriate screening tests for age  Also reviewed health mt list, fam hx and immunization status , as well as social and family history   See HPI Labs reviewed cologuard neg 3/19 Declines shingrix vaccine  Had first covid shot and planning the 2nd dexa nl 5/13   No falls or fx/ adv pt to start ca and D for bone health and continue exercise Materials given to work on adv directive (reminded her to complete)  No cognitive concerns at all  Failed hearing screen (pt says hearing does not bother her and she is not interested in audiology eval at this time)

## 2019-09-21 NOTE — Assessment & Plan Note (Signed)
Lab Results  Component Value Date   [id number] 09/14/2019  stable with current supplementation

## 2019-09-21 NOTE — Assessment & Plan Note (Signed)
Well controlled with metformin and glipizide  Lab Results  Component Value Date   HGBA1C 6.4 09/14/2019   Eye exam utd Good foot care  Intolerant of statins  Taking arb F/u 3 mo  Enc good diet/exercise

## 2019-09-21 NOTE — Assessment & Plan Note (Signed)
With h/o CAD and CVA and a fib bp in fair control at this time  BP Readings from Last 1 Encounters:  09/21/19 124/70   No changes needed Most recent labs reviewed  Disc lifstyle change with low sodium diet and exercise

## 2019-09-21 NOTE — Assessment & Plan Note (Signed)
Disc goals for lipids and reasons to control them Rev last labs with pt Rev low sat fat diet in detail  Intol of statins in the past Pt mentioned that cardiology put her on something but she is not sure what (not in med list) ? If PCYK9 Goal LDL under 70 with CAD and DM2

## 2019-09-21 NOTE — Assessment & Plan Note (Signed)
Discussed how this problem influences overall health and the risks it imposes  Reviewed plan for weight loss with lower calorie diet (via better food choices and also portion control or program like weight watchers) and exercise building up to or more than 30 minutes 5 days per week including some aerobic activity   Commended on wt loss and mt so far

## 2019-09-21 NOTE — Assessment & Plan Note (Signed)
Pt continues derm visits Last lesion on L arm

## 2019-09-21 NOTE — Progress Notes (Signed)
Subjective:    Patient ID: Margaret Vazquez, female    DOB: October 26, 1935, 84 y.o.   MRN: [id number]  This visit occurred during the SARS-CoV-2 public health emergency.  Safety protocols were in place, including screening questions prior to the visit, additional usage of staff PPE, and extensive cleaning of exam room while observing appropriate contact time as indicated for disinfecting solutions.    HPI Pt presents for amw and health mt exam with review of chronic medical problems   I have personally reviewed the Medicare Annual Wellness questionnaire and have noted 1. The patient's medical and social history 2. Their use of alcohol, tobacco or illicit drugs 3. Their current medications and supplements 4. The patient's functional ability including ADL's, fall risks, home safety risks and hearing or visual             impairment. 5. Diet and physical activities 6. Evidence for depression or mood disorders  The patients weight, height, BMI have been recorded in the chart and visual acuity is per eye clinic.  I have made referrals, counseling and provided education to the patient based review of the above and I have provided the pt with a written personalized care plan for preventive services. Reviewed and updated provider list, see scanned forms.  See scanned forms.  Routine anticipatory guidance given to patient.  See health maintenance. Colon cancer screening   Colonoscopy 9/13 and cologuard neg 3/19  Breast cancer screening -mammogram 5/20  Self breast exam- no lumps  Flu vaccine 9/20 Tetanus vaccine 4/13 Td Pneumovax completed Zoster vaccine- not interested  Had her first covid vaccine - has a 2nd one coming up  Dexa 5/13 with BMD in the normal range  Falls-none Fractures-none  Supplements- not taking D or ca  Exercise - PT   Advance directive- needs to work on (has the packet at home)  Cognitive function addressed- see scanned forms- and if abnormal then additional  documentation follows.  No problems at all/doing well  Handles own affairs  PMH and SH reviewed  Meds, vitals, and allergies reviewed.   ROS: See HPI.  Otherwise negative.    Specialists involved in her care : Deboraha Sprang, MD Cardiology   Other Patient Care Team Members Specialty  Dasher, Rayvon Char, MD Dermatology  Salomon Fick., MD Dentistry  Leandrew Koyanagi, MD Ophthalmology     Weight : Wt Readings from Last 3 Encounters:  09/21/19 193 lb 1 oz (87.6 kg)  09/08/19 198 lb (89.8 kg)  08/16/19 195 lb 1 oz (88.5 kg)  down 5 lbs  She thinks she gained before  Has cut back on snacks Also doing PT 29.36 kg/m   Hearing/vision:  Hearing Screening   125Hz  250Hz  500Hz  1000Hz  2000Hz  3000Hz  4000Hz  6000Hz  8000Hz   Right ear:   0 0 0  0    Left ear:   0 0 0  40    Vision Screening Comments: Pt had eye exam in Sept 2020 at [facility] hearing loss does not bother her  She does not turn TV up - does ok  Not bothering her  Declines audiology    HTN -in setting of a fib and CAD  bp is stable today  No cp or palpitations or headaches or edema  No side effects to medicines  BP Readings from Last 3 Encounters:  09/21/19 124/70  09/08/19 125/62  08/16/19 (!) 146/72     Lab Results  Component Value Date   CREATININE 0.94  09/14/2019   BUN 15 09/14/2019   NA 140 09/14/2019   K 3.9 09/14/2019   CL 105 09/14/2019   CO2 30 09/14/2019  GFR was 57.2  Lab Results  Component Value Date   ALT 12 09/14/2019   AST 13 09/14/2019   ALKPHOS 69 09/14/2019   BILITOT 0.5 09/14/2019    Lab Results  Component Value Date   WBC 6.1 09/14/2019   HGB 13.7 09/14/2019   HCT 41.4 09/14/2019   MCV 96.6 09/14/2019   PLT 221.0 09/14/2019    Lab Results  Component Value Date   TSH 4.10 09/14/2019     DM2 Lab Results  Component Value Date   HGBA1C 6.4 09/14/2019  up from 6.1 glucose 103 fasting  Taking metformin and glipizide  In am glucose is always under 120    She stopped eating after dinner     Hyperlipidemia Lab Results  Component Value Date   CHOL 185 09/14/2019   CHOL 168 03/23/2019   CHOL 178 09/09/2018   Lab Results  Component Value Date   HDL 47.60 09/14/2019   HDL 42.50 03/23/2019   HDL 46.20 09/09/2018   Lab Results  Component Value Date   LDLCALC 114 (H) 09/14/2019   LDLCALC 91 03/23/2019   LDLCALC 102 (H) 09/09/2018   Lab Results  Component Value Date   TRIG 116.0 09/14/2019   TRIG 169.0 (H) 03/23/2019   TRIG 153.0 (H) 09/09/2018   Lab Results  Component Value Date   CHOLHDL 4 09/14/2019   CHOLHDL 4 03/23/2019   CHOLHDL 4 09/09/2018   Lab Results  Component Value Date   LDLDIRECT 107.0 08/27/2016   LDLDIRECT 136.5 10/20/2011   LDLDIRECT 134.7 08/08/2008   Intolerant of statins - has tried several  She avoids red meat  Egg occ  Berniece Salines /sausage too often  Has d/w cardiology= thinks she was put on something by Dr Caryl Comes -unsure what and not on med list   B12 def Lab Results  Component Value Date   VITAMINB12 681 09/14/2019    Going for therapy for back/hip  Pulled ligament in back of her knee after lifting  Doing exercises at home   Patient Active Problem List   Diagnosis Date Noted  . History of loop recorder 09/06/2019  . Cryptogenic stroke ([place]) 08/02/2019  . Focal motor seizure ([place]) 03/23/2019  . Breast lump on left side at 10 o'clock position 11/17/2018  . Left-sided epistaxis 11/17/2018  . Medicare annual wellness visit, subsequent 09/13/2018  . Screening mammogram, encounter for 09/13/2018  . Herpes zoster 08/10/2018  . Plantar wart, left foot 06/15/2018  . Atherosclerotic heart disease of native coronary artery with other forms of angina pectoris ([place]) 12/24/2017  . Routine general medical examination at a health care facility 08/24/2016  . PAF (paroxysmal atrial fibrillation) ([place]) 04/07/2016  . Overweight (BMI 25.0-29.9) 08/23/2014  . Type 2 diabetes mellitus with diabetic  peripheral angiopathy without gangrene, without long-term current use of insulin ([place]) 01/12/2014  . TIA (transient ischemic attack) 01/11/2014  . CVA (cerebral infarction) 01/11/2014  . LOOP Recorder LINQ 06/07/2013  . Fatigue 06/07/2013  . Sinusitis, chronic 05/16/2013  . H/O: CVA (cerebrovascular accident) 02/15/2013  . Encounter for Medicare annual wellness exam 10/27/2012  . Encopresis(307.7) 12/15/2011  . Post-menopausal 10/28/2011  . PERSONAL HX COLONIC POLYPS 09/04/2009  . B12 deficiency 03/06/2009  . ALLERGIC RHINITIS 02/14/2008  . BACK PAIN, LUMBAR 11/03/2007  . Hyperlipidemia associated with type 2 diabetes mellitus ([place]) 11/24/2006  .  Essential hypertension 11/24/2006  . GERD 10/09/2006  . OVERACTIVE BLADDER 10/09/2006  . INCONTINENCE, URGE 10/09/2006  . SKIN CANCER, HX OF 10/09/2006   Past Medical History:  Diagnosis Date  . Allergy history, drug    Aspirin  . Basal cell carcinoma    "back"  . CAD (coronary artery disease)    a. Previously nonobstructive then progressive angina with abnl CT -> cardiac cath 12/24/17 showed 30% mid RCA and 80% prox-mid Cx. She received DES to mid AV groove Cx. EF 55-65%.   . Cervical stenosis of spine    With neck pain  . CKD (chronic kidney disease), stage II   . Colon polyps 2009  . Complication of anesthesia 1980s   slow to wake after anesthesia "when I had breast biopsy"  . GERD (gastroesophageal reflux disease)   . Hyperlipidemia    myalgias with Lipitor and Zetia  . Hypertension   . Migraine    "stopped at age 58" (12/24/2017)  . Squamous carcinoma    "iced off and cut off; mostly arms" (12/24/2017)  . Stroke [facility])    "told me I'd had 2 strokes in 02/2013"; denies residual on 12/24/2017  . TIA (transient ischemic attack)   . Type II diabetes mellitus ([place])    Past Surgical History:  Procedure Laterality Date  . ABD [place]  07/2003   Negative  . APPENDECTOMY    . BASAL CELL CARCINOMA EXCISION     "back"  . BREAST BIOPSY  Left 1992   benign  . CARDIAC CATHETERIZATION  04/07/2011   non obst CAD (Dr Burt Knack)  . CATARACT EXTRACTION W/PHACO Left 04/01/2017   Procedure: CATARACT EXTRACTION PHACO AND INTRAOCULAR LENS PLACEMENT ([place]) LEFT DIABETIC;  Surgeon: Leandrew Koyanagi, MD;  Location: [place];  Service: Ophthalmology;  Laterality: Left;  Diabetic - oral meds  . CATARACT EXTRACTION W/PHACO Right 04/29/2017   Procedure: CATARACT EXTRACTION PHACO AND INTRAOCULAR LENS PLACEMENT ([place]) RIGHT DIABETIC;  Surgeon: Leandrew Koyanagi, MD;  Location: [place];  Service: Ophthalmology;  Laterality: Right;  Diabetic - oral meds  . COLONOSCOPY  12/2007   Adenomatous colon polyps  . CORONARY ANGIOPLASTY WITH STENT PLACEMENT  12/24/2017  . CORONARY STENT INTERVENTION N/A 12/24/2017   Procedure: CORONARY STENT INTERVENTION;  Surgeon: Lorretta Harp, MD;  Location: [place] CV LAB;  Service: Cardiovascular;  Laterality: N/A;  . CYSTOSCOPY W/ DECANNULATION  03/2000   Normal  . LEFT HEART CATH AND CORONARY ANGIOGRAPHY N/A 12/24/2017   Procedure: LEFT HEART CATH AND CORONARY ANGIOGRAPHY;  Surgeon: Lorretta Harp, MD;  Location: [place] CV LAB;  Service: Cardiovascular;  Laterality: N/A;  . LOOP RECORDER IMPLANT N/A 02/16/2013   Procedure: LOOP RECORDER IMPLANT;  Surgeon: Deboraha Sprang, MD;  Location: [facility] CATH LAB;  Service: Cardiovascular;  Laterality: N/A;  . NASAL SINUS SURGERY  01/2005  . SQUAMOUS CELL CARCINOMA EXCISION     "mostly arms;" (12/24/2017)  . STRESS CARDIOLITE  11/1999   Normal/ negative  . TEAR DUCT PROBING  2005   "? side"  . TEE WITHOUT CARDIOVERSION N/A 02/16/2013   Procedure: TRANSESOPHAGEAL ECHOCARDIOGRAM (TEE);  Surgeon: Josue Hector, MD;  Location[facility] ENDOSCOPY;  Service: Cardiovascular;  Laterality: N/A;  . TUBAL LIGATION     BTL   Social History   Tobacco Use  . Smoking status: Never Smoker  . Smokeless tobacco: Never Used  Substance Use Topics  . Alcohol  use: Never    Alcohol/week: 0.0 standard drinks  .  Drug use: Never   Family History  Problem Relation Age of Onset  . Lung cancer Brother   . Diabetes Brother   . Pancreatic cancer Brother   . Heart disease Mother   . Heart disease Father   . Brain cancer Other   . Skin cancer Daughter   . Diabetes Sister   . Breast cancer Sister   . Colon cancer Neg Hx    Allergies  Allergen Reactions  . Bee Venom Hives, Shortness Of Breath and Swelling  . Nabumetone Anaphylaxis  . Amoxicillin-Pot Clavulanate Hives and Swelling    To lips.  . Aspirin Hives  . Atorvastatin Swelling     joint pain/swelling, inc liver tests  . Clopidogrel Bisulfate Hives  . Codeine Nausea And Vomiting  . Ezetimibe Other (See Comments)     fatigue  . Metformin And Related Other (See Comments)    Diarrhea   . Valsartan Other (See Comments)     fatigue  . Other Hives    **Red Meat**  SOB   Current Outpatient Medications on File Prior to Visit  Medication Sig Dispense Refill  . Blood Glucose Monitoring Suppl (ONE TOUCH ULTRA 2) w/Device KIT Use to check blood sugar once daily 1 each 0  . BRILINTA 90 MG TABS tablet TAKE 1 TABLET BY MOUTH TWICE DAILY 60 tablet 8  . diphenhydrAMINE (BENADRYL) 25 MG tablet Take 50 mg by mouth every 8 (eight) hours as needed for allergies.     Marland Kitchen EPINEPHrine (EPI-PEN) 0.3 mg/0.3 mL DEVI Inject 0.3 mg into the muscle daily as needed (allergic reaction).     Marland Kitchen erythromycin ophthalmic ointment     . fluticasone (FLONASE) 50 MCG/ACT nasal spray Place 2 sprays into both nostrils 2 (two) times daily as needed. Reported on 09/10/2015    . isosorbide mononitrate (IMDUR) 60 MG 24 hr tablet Take 1 tablet (60 mg total) by mouth daily. 90 tablet 3  . losartan-hydrochlorothiazide (HYZAAR) 50-12.5 MG tablet Take 0.5 tablets by mouth daily. 45 tablet 3  . nitroGLYCERIN (NITROSTAT) 0.4 MG SL tablet Place 1 tablet (0.4 mg total) under the tongue every 5 (five) minutes as needed for chest pain (up to  3 doses. If taking 3rd dose call 911). 25 tablet 3   No current facility-administered medications on file prior to visit.    Review of Systems  Constitutional: Negative for activity change, appetite change, fatigue, fever and unexpected weight change.  HENT: Negative for congestion, ear pain, rhinorrhea, sinus pressure and sore throat.   Eyes: Negative for pain, redness and visual disturbance.  Respiratory: Negative for cough, shortness of breath and wheezing.   Cardiovascular: Negative for chest pain and palpitations.  Gastrointestinal: Negative for abdominal pain, blood in stool, constipation and diarrhea.  Endocrine: Negative for polydipsia and polyuria.  Genitourinary: Negative for dysuria, frequency and urgency.  Musculoskeletal: Positive for arthralgias, back pain and gait problem. Negative for myalgias.  Skin: Negative for pallor and rash.  Allergic/Immunologic: Negative for environmental allergies.  Neurological: Negative for dizziness, syncope and headaches.  Hematological: Negative for adenopathy. Does not bruise/bleed easily.  Psychiatric/Behavioral: Negative for decreased concentration and dysphoric mood. The patient is not nervous/anxious.        Objective:   Physical Exam Constitutional:      General: She is not in acute distress.    Appearance: Normal appearance. She is well-developed. She is not ill-appearing or diaphoretic.     Comments: overwt  HENT:     Head: Normocephalic and  atraumatic.     Right Ear: Tympanic membrane, ear canal and external ear normal.     Left Ear: Tympanic membrane, ear canal and external ear normal.     Nose: Nose normal. No congestion.     Mouth/Throat:     Mouth: Mucous membranes are moist.     Pharynx: Oropharynx is clear. No posterior oropharyngeal erythema.  Eyes:     General: No scleral icterus.    Extraocular Movements: Extraocular movements intact.     Conjunctiva/sclera: Conjunctivae normal.     Pupils: Pupils are equal,  round, and reactive to light.  Neck:     Thyroid: No thyromegaly.     Vascular: No carotid bruit or JVD.  Cardiovascular:     Rate and Rhythm: Normal rate and regular rhythm.     Pulses: Normal pulses.     Heart sounds: Normal heart sounds. No gallop.   Pulmonary:     Effort: Pulmonary effort is normal. No respiratory distress.     Breath sounds: Normal breath sounds. No wheezing.     Comments: Good air exch Chest:     Chest wall: No tenderness.  Abdominal:     General: Bowel sounds are normal. There is no distension or abdominal bruit.     Palpations: Abdomen is soft. There is no mass.     Tenderness: There is no abdominal tenderness.     Hernia: No hernia is present.  Genitourinary:    Comments: Breast exam: No mass, nodules, thickening, tenderness, bulging, retraction, inflamation, nipple discharge or skin changes noted.  No axillary or clavicular LA.     Musculoskeletal:        General: No tenderness. Normal range of motion.     Cervical back: Normal range of motion and neck supple. No rigidity. No muscular tenderness.     Right lower leg: No edema.     Left lower leg: No edema.     Comments: No kyphosis   Lymphadenopathy:     Cervical: No cervical adenopathy.  Skin:    General: Skin is warm and dry.     Coloration: Skin is not pale.     Findings: No erythema or rash.     Comments: Lentigines and solar aging with thin skin on arms Scars from prev procedures sks diffusely  Healing bx site on L forearm   Neurological:     Mental Status: She is alert. Mental status is at baseline.     Cranial Nerves: No cranial nerve deficit.     Motor: No abnormal muscle tone.     Coordination: Coordination normal.     Gait: Gait normal.     Deep Tendon Reflexes: Reflexes are normal and symmetric. Reflexes normal.  Psychiatric:        Mood and Affect: Mood normal.        Cognition and Memory: Cognition and memory normal.     Comments: Mentally sharp and talkative            Assessment & Plan:   Problem List Items Addressed This Visit      Cardiovascular and Mediastinum   Essential hypertension    With h/o CAD and CVA and a fib bp in fair control at this time  BP Readings from Last 1 Encounters:  09/21/19 124/70   No changes needed Most recent labs reviewed  Disc lifstyle change with low sodium diet and exercise        Type 2 diabetes mellitus with diabetic peripheral angiopathy  without gangrene, without long-term current use of insulin ([place])    Well controlled with metformin and glipizide  Lab Results  Component Value Date   HGBA1C 6.4 09/14/2019   Eye exam utd Good foot care  Intolerant of statins  Taking arb F/u 3 mo  Enc good diet/exercise      Relevant Medications   glipiZIDE (GLUCOTROL XL) 10 MG 24 hr tablet   metFORMIN (GLUCOPHAGE-XR) 500 MG 24 hr tablet   PAF (paroxysmal atrial fibrillation) (HCC)    No clinical changes Continues cardiology care         Endocrine   Hyperlipidemia associated with type 2 diabetes mellitus ([place])    Disc goals for lipids and reasons to control them Rev last labs with pt Rev low sat fat diet in detail  Intol of statins in the past Pt mentioned that cardiology put her on something but she is not sure what (not in med list) ? If [id number] Goal LDL under 70 with CAD and DM2      Relevant Medications   glipiZIDE (GLUCOTROL XL) 10 MG 24 hr tablet   metFORMIN (GLUCOPHAGE-XR) 500 MG 24 hr tablet     Other   B12 deficiency    Lab Results  Component Value Date   [id number] 035 09/14/2019  stable with current supplementation       SKIN CANCER, HX OF    Pt continues derm visits Last lesion on L arm      Overweight (BMI 25.0-29.9)    Discussed how this problem influences overall health and the risks it imposes  Reviewed plan for weight loss with lower calorie diet (via better food choices and also portion control or program like weight watchers) and exercise building up to or more than 30 minutes  5 days per week including some aerobic activity   Commended on wt loss and mt so far      Routine general medical examination at a health care facility    Reviewed health habits including diet and exercise and skin cancer prevention Reviewed appropriate screening tests for age  Also reviewed health mt list, fam hx and immunization status , as well as social and family history   See HPI Labs reviewed cologuard neg 3/19 Declines shingrix vaccine  Had first covid shot and planning the 2nd dexa nl 5/13   No falls or fx/ adv pt to start ca and D for bone health and continue exercise Materials given to work on adv directive (reminded her to complete)  No cognitive concerns at all  Failed hearing screen (pt says hearing does not bother her and she is not interested in audiology eval at this time)         Medicare annual wellness visit, subsequent - Primary    Reviewed health habits including diet and exercise and skin cancer prevention Reviewed appropriate screening tests for age  Also reviewed health mt list, fam hx and immunization status , as well as social and family history   See HPI Labs reviewed cologuard neg 3/19 Declines shingrix vaccine  Had first covid shot and planning the 2nd dexa nl 5/13   No falls or fx/ adv pt to start ca and D for bone health and continue exercise Materials given to work on adv directive (reminded her to complete)  No cognitive concerns at all  Failed hearing screen (pt says hearing does not bother her and she is not interested in audiology eval at this time)

## 2019-09-22 ENCOUNTER — Telehealth: Payer: Self-pay | Admitting: Family Medicine

## 2019-09-22 NOTE — Telephone Encounter (Signed)
Thanks for letting me know - it was on her med list after all (just is not a cholesterol medicine)

## 2019-09-22 NOTE — Telephone Encounter (Signed)
Pt called to let dr tower know new med that dr Caryl Comes put her on  isosorbide mononipisate 60mg   Once daily

## 2019-09-23 ENCOUNTER — Ambulatory Visit: Payer: Self-pay

## 2019-09-27 ENCOUNTER — Emergency Department: Payer: HMO

## 2019-09-27 ENCOUNTER — Other Ambulatory Visit: Payer: Self-pay

## 2019-09-27 ENCOUNTER — Emergency Department
Admission: EM | Admit: 2019-09-27 | Discharge: 2019-09-27 | Disposition: A | Discharge status: Home / Self Care | Payer: HMO | Attending: Emergency Medicine | Admitting: Emergency Medicine

## 2019-09-27 DIAGNOSIS — Z85828 Personal history of other malignant neoplasm of skin: Secondary | ICD-10-CM | POA: Diagnosis not present

## 2019-09-27 DIAGNOSIS — Z955 Presence of coronary angioplasty implant and graft: Secondary | ICD-10-CM | POA: Insufficient documentation

## 2019-09-27 DIAGNOSIS — Z7984 Long term (current) use of oral hypoglycemic drugs: Secondary | ICD-10-CM | POA: Diagnosis not present

## 2019-09-27 DIAGNOSIS — M25562 Pain in left knee: Secondary | ICD-10-CM | POA: Diagnosis present

## 2019-09-27 DIAGNOSIS — M2392 Unspecified internal derangement of left knee: Secondary | ICD-10-CM | POA: Diagnosis not present

## 2019-09-27 DIAGNOSIS — N182 Chronic kidney disease, stage 2 (mild): Secondary | ICD-10-CM | POA: Diagnosis not present

## 2019-09-27 DIAGNOSIS — Z95818 Presence of other cardiac implants and grafts: Secondary | ICD-10-CM | POA: Diagnosis not present

## 2019-09-27 DIAGNOSIS — W010XXA Fall on same level from slipping, tripping and stumbling without subsequent striking against object, initial encounter: Secondary | ICD-10-CM | POA: Insufficient documentation

## 2019-09-27 DIAGNOSIS — I2510 Atherosclerotic heart disease of native coronary artery without angina pectoris: Secondary | ICD-10-CM | POA: Diagnosis not present

## 2019-09-27 DIAGNOSIS — E1122 Type 2 diabetes mellitus with diabetic chronic kidney disease: Secondary | ICD-10-CM | POA: Diagnosis not present

## 2019-09-27 DIAGNOSIS — Z8673 Personal history of transient ischemic attack (TIA), and cerebral infarction without residual deficits: Secondary | ICD-10-CM | POA: Diagnosis not present

## 2019-09-27 DIAGNOSIS — Z79899 Other long term (current) drug therapy: Secondary | ICD-10-CM | POA: Insufficient documentation

## 2019-09-27 DIAGNOSIS — S8992XA Unspecified injury of left lower leg, initial encounter: Secondary | ICD-10-CM | POA: Diagnosis not present

## 2019-09-27 DIAGNOSIS — I129 Hypertensive chronic kidney disease with stage 1 through stage 4 chronic kidney disease, or unspecified chronic kidney disease: Secondary | ICD-10-CM | POA: Diagnosis not present

## 2019-09-27 MED ORDER — ACETAMINOPHEN 500 MG PO TABS: 1000.0000 mg | ORAL_TABLET | Freq: Once | ORAL | Status: DC

## 2019-09-27 NOTE — Discharge Instructions (Addendum)
Follow-up with your regular doctor as needed Follow-up with [facility] clinic orthopedics concerning your knee Apply ice 3 times daily for at least 15 minutes Wear the knee immobilizer to help the knee decrease in swelling Return emergency department worsening

## 2019-09-27 NOTE — ED Provider Notes (Signed)
[facility] Emergency Department Provider Note  ____________________________________________   First MD Initiated Contact with Patient 09/27/19 1843     (approximate)  I have reviewed the triage vital signs and the nursing notes.   HISTORY  Chief Complaint Leg Pain    HPI Margaret Vazquez is a 84 y.o. female presents emergency department complaining of left knee pain.  States she tried to walk to find her husband down in the woods and fell.  States that the left knee locked up.  She states she felt a sharp pain in the back of the knee and has been unable to bear weight or lift the knee since the incident.  She states it feels swollen and tight at the knee itself.  States she had had some pain in it for the past few weeks but today it got severe.    Past Medical History:  Diagnosis Date  . Allergy history, drug    Aspirin  . Basal cell carcinoma    "back"  . CAD (coronary artery disease)    a. Previously nonobstructive then progressive angina with abnl CT -> cardiac cath 12/24/17 showed 30% mid RCA and 80% prox-mid Cx. She received DES to mid AV groove Cx. EF 55-65%.   . Cervical stenosis of spine    With neck pain  . CKD (chronic kidney disease), stage II   . Colon polyps 2009  . Complication of anesthesia 1980s   slow to wake after anesthesia "when I had breast biopsy"  . GERD (gastroesophageal reflux disease)   . Hyperlipidemia    myalgias with Lipitor and Zetia  . Hypertension   . Migraine    "stopped at age 62" (12/24/2017)  . Squamous carcinoma    "iced off and cut off; mostly arms" (12/24/2017)  . Stroke [facility])    "told me I'd had 2 strokes in 02/2013"; denies residual on 12/24/2017  . TIA (transient ischemic attack)   . Type II diabetes mellitus [facility])     Patient Active Problem List   Diagnosis Date Noted  . History of loop recorder 09/06/2019  . Cryptogenic stroke ([place]) 08/02/2019  . Focal motor seizure ([place]) 03/23/2019  . Breast lump on  left side at 10 o'clock position 11/17/2018  . Left-sided epistaxis 11/17/2018  . Medicare annual wellness visit, subsequent 09/13/2018  . Screening mammogram, encounter for 09/13/2018  . Herpes zoster 08/10/2018  . Plantar wart, left foot 06/15/2018  . Atherosclerotic heart disease of native coronary artery with other forms of angina pectoris ([place]) 12/24/2017  . Routine general medical examination at a health care facility 08/24/2016  . PAF (paroxysmal atrial fibrillation) ([place]) 04/07/2016  . Overweight (BMI 25.0-29.9) 08/23/2014  . Type 2 diabetes mellitus with diabetic peripheral angiopathy without gangrene, without long-term current use of insulin ([place]) 01/12/2014  . TIA (transient ischemic attack) 01/11/2014  . CVA (cerebral infarction) 01/11/2014  . LOOP Recorder LINQ 06/07/2013  . Fatigue 06/07/2013  . Sinusitis, chronic 05/16/2013  . H/O: CVA (cerebrovascular accident) 02/15/2013  . Encounter for Medicare annual wellness exam 10/27/2012  . Encopresis(307.7) 12/15/2011  . Post-menopausal 10/28/2011  . PERSONAL HX COLONIC POLYPS 09/04/2009  . B12 deficiency 03/06/2009  . ALLERGIC RHINITIS 02/14/2008  . BACK PAIN, LUMBAR 11/03/2007  . Hyperlipidemia associated with type 2 diabetes mellitus ([place]) 11/24/2006  . Essential hypertension 11/24/2006  . GERD 10/09/2006  . OVERACTIVE BLADDER 10/09/2006  . INCONTINENCE, URGE 10/09/2006  . SKIN CANCER, HX OF 10/09/2006    Past Surgical  History:  Procedure Laterality Date  . ABD [place]  07/2003   Negative  . APPENDECTOMY    . BASAL CELL CARCINOMA EXCISION     "back"  . BREAST BIOPSY Left 1992   benign  . CARDIAC CATHETERIZATION  04/07/2011   non obst CAD (Dr Burt Knack)  . CATARACT EXTRACTION W/PHACO Left 04/01/2017   Procedure: CATARACT EXTRACTION PHACO AND INTRAOCULAR LENS PLACEMENT ([place]) LEFT DIABETIC;  Surgeon: Leandrew Koyanagi, MD;  Location: [place];  Service: Ophthalmology;  Laterality: Left;  Diabetic - oral meds    . CATARACT EXTRACTION W/PHACO Right 04/29/2017   Procedure: CATARACT EXTRACTION PHACO AND INTRAOCULAR LENS PLACEMENT ([place]) RIGHT DIABETIC;  Surgeon: Leandrew Koyanagi, MD;  Location: [place];  Service: Ophthalmology;  Laterality: Right;  Diabetic - oral meds  . COLONOSCOPY  12/2007   Adenomatous colon polyps  . CORONARY ANGIOPLASTY WITH STENT PLACEMENT  12/24/2017  . CORONARY STENT INTERVENTION N/A 12/24/2017   Procedure: CORONARY STENT INTERVENTION;  Surgeon: Lorretta Harp, MD;  Location: [place] CV LAB;  Service: Cardiovascular;  Laterality: N/A;  . CYSTOSCOPY W/ DECANNULATION  03/2000   Normal  . LEFT HEART CATH AND CORONARY ANGIOGRAPHY N/A 12/24/2017   Procedure: LEFT HEART CATH AND CORONARY ANGIOGRAPHY;  Surgeon: Lorretta Harp, MD;  Location: [place] CV LAB;  Service: Cardiovascular;  Laterality: N/A;  . LOOP RECORDER IMPLANT N/A 02/16/2013   Procedure: LOOP RECORDER IMPLANT;  Surgeon: Deboraha Sprang, MD;  Location: [facility] CATH LAB;  Service: Cardiovascular;  Laterality: N/A;  . NASAL SINUS SURGERY  01/2005  . SQUAMOUS CELL CARCINOMA EXCISION     "mostly arms;" (12/24/2017)  . STRESS CARDIOLITE  11/1999   Normal/ negative  . TEAR DUCT PROBING  2005   "? side"  . TEE WITHOUT CARDIOVERSION N/A 02/16/2013   Procedure: TRANSESOPHAGEAL ECHOCARDIOGRAM (TEE);  Surgeon: Josue Hector, MD;  Location: [facility] ENDOSCOPY;  Service: Cardiovascular;  Laterality: N/A;  . TUBAL LIGATION     BTL    Prior to Admission medications   Medication Sig Start Date End Date Taking? Authorizing Provider  Blood Glucose Monitoring Suppl (ONE TOUCH ULTRA 2) w/Device KIT Use to check blood sugar once daily 10/25/18   Tower, Alta A, MD  BRILINTA 90 MG TABS tablet TAKE 1 TABLET BY MOUTH TWICE DAILY 03/11/19   Deboraha Sprang, MD  diphenhydrAMINE (BENADRYL) 25 MG tablet Take 50 mg by mouth every 8 (eight) hours as needed for allergies.     [provider]  EPINEPHrine (EPI-PEN) 0.3  mg/0.3 mL DEVI Inject 0.3 mg into the muscle daily as needed (allergic reaction).  12/20/10   Tower, [name], MD  erythromycin ophthalmic ointment  07/12/18   [provider]  fluticasone (FLONASE) 50 MCG/ACT nasal spray Place 2 sprays into both nostrils 2 (two) times daily as needed. Reported on 09/10/2015 01/20/14   [provider]  glipiZIDE (GLUCOTROL XL) 10 MG 24 hr tablet Take 1 tablet (10 mg total) by mouth daily with breakfast. 09/21/19   Tower, [name], MD  isosorbide mononitrate (IMDUR) 60 MG 24 hr tablet Take 1 tablet (60 mg total) by mouth daily. 08/03/19   Deboraha Sprang, MD  losartan-hydrochlorothiazide (HYZAAR) 50-12.5 MG tablet Take 0.5 tablets by mouth daily. 08/03/19   Deboraha Sprang, MD  metFORMIN (GLUCOPHAGE-XR) 500 MG 24 hr tablet TAKE 1 TABLET(500 MG) BY MOUTH DAILY WITH BREAKFAST 09/21/19   Tower, [name], MD  nitroGLYCERIN (NITROSTAT) 0.4 MG SL tablet Place  1 tablet (0.4 mg total) under the tongue every 5 (five) minutes as needed for chest pain (up to 3 doses. If taking 3rd dose call 911). 12/25/17   Dunn, Nedra Hai, PA-C  potassium chloride SA (KLOR-CON) 20 MEQ tablet Take 1 tablet (20 mEq total) by mouth daily. 09/21/19   Tower, [name], MD    Allergies Bee venom, Nabumetone, Amoxicillin-pot clavulanate, Aspirin, Atorvastatin, Clopidogrel bisulfate, Codeine, Ezetimibe, Metformin and related, Valsartan, and Other  Family History  Problem Relation Age of Onset  . Lung cancer Brother   . Diabetes Brother   . Pancreatic cancer Brother   . Heart disease Mother   . Heart disease Father   . Brain cancer Other   . Skin cancer Daughter   . Diabetes Sister   . Breast cancer Sister   . Colon cancer Neg Hx     Social History Social History   Tobacco Use  . Smoking status: Never Smoker  . Smokeless tobacco: Never Used  Substance Use Topics  . Alcohol use: Never    Alcohol/week: 0.0 standard drinks  . Drug use: Never    Review of Systems  Constitutional: No  fever/chills Eyes: No visual changes. ENT: No sore throat. Respiratory: Denies cough Cardiovascular: Denies chest pain Gastrointestinal: Denies abdominal pain Genitourinary: Negative for dysuria. Musculoskeletal: Negative for back pain.  Positive for left knee pain Skin: Negative for rash. Psychiatric: no mood changes,     ____________________________________________   PHYSICAL EXAM:  VITAL SIGNS: ED Triage Vitals  Enc Vitals Group     BP 09/27/19 1641 (!) 129/99     Pulse Rate 09/27/19 1641 73     Resp 09/27/19 1641 18     Temp 09/27/19 1641 98.4 F (36.9 C)     Temp Source 09/27/19 1641 Oral     SpO2 09/27/19 1641 96 %     Weight 09/27/19 1642 192 lb (87.1 kg)     Height 09/27/19 1642 5' 8"  (1.727 m)     Head Circumference --      Peak Flow --      Pain Score 09/27/19 1651 10     Pain Loc --      Pain Edu? --      Excl. in [place]? --     Constitutional: Alert and oriented. Well appearing and in no acute distress. Eyes: Conjunctivae are normal.  Head: Atraumatic. Nose: No congestion/rhinnorhea. Mouth/Throat: Mucous membranes are moist.   Neck:  supple no lymphadenopathy noted Cardiovascular: Normal rate, regular rhythm.  Respiratory: Normal respiratory effort.  No retractions,  GU: deferred Musculoskeletal: Decreased range of motion of the left knee, area is tender at the posterior knee, some swelling noted posteriorly, question Baker's cyst rupture versus meniscus tear, neurovascular is intact, patient has difficulty bending the knee and doing a straight leg raise.   Neurologic:  Normal speech and language.  Skin:  Skin is warm, dry and intact. No rash noted. Psychiatric: Mood and affect are normal. Speech and behavior are normal.  ____________________________________________   LABS (all labs ordered are listed, but only abnormal results are displayed)  Labs Reviewed - No data to  display ____________________________________________   ____________________________________________  RADIOLOGY  X-ray of the left knee is negative for fracture  ____________________________________________   PROCEDURES  Procedure(s) performed: Knee immobilizer and walker   Procedures    ____________________________________________   INITIAL IMPRESSION / ASSESSMENT AND PLAN / ED COURSE  Pertinent labs & imaging results that were available during my care of  the patient were reviewed by me and considered in my medical decision making (see chart for details).   Patient is 84 year old female presents emergency department complaining of left knee pain.  See HPI  Discal exam shows left knee to be tender posteriorly, some swelling, pain is increased with trying to bend the knee and to do a straight leg raise.  Neurovascular is intact, calf is nontender  I do not feel that the patient has a DVT.  Feel that she probably has a meniscus tear or a ruptured Baker's cyst.  Due to this finding we will place her in a knee immobilizer and give her a walker.  She states she only wants to take Tylenol but does have some tramadol at home that she can take for pain not controlled by Tylenol.  Explained to her that she needs to ice the knee for 15 minutes 3 times daily.  Follow-up with [facility] clinic orthopedics.  Return emergency department worsening.  States she understands will comply.  She was discharged stable condition.    Margaret Vazquez was evaluated in Emergency Department on 09/27/2019 for the symptoms described in the history of present illness. She was evaluated in the context of the global COVID-19 pandemic, which necessitated consideration that the patient might be at risk for infection with the SARS-CoV-2 virus that causes COVID-19. Institutional protocols and algorithms that pertain to the evaluation of patients at risk for COVID-19 are in a state of rapid change based on information  released by regulatory bodies including the CDC and federal and state organizations. These policies and algorithms were followed during the patient's care in the ED.   As part of my medical decision making, I reviewed the following data within the [place] notes reviewed and incorporated, Old chart reviewed, Radiograph reviewed , Notes from prior ED visits and [place] Controlled Substance Database  ____________________________________________   FINAL CLINICAL IMPRESSION(S) / ED DIAGNOSES  Final diagnoses:  Internal derangement of left knee      NEW MEDICATIONS STARTED DURING THIS VISIT:  New Prescriptions   No medications on file     Note:  This document was prepared using Dragon voice recognition software and may include unintentional dictation errors.    Versie Starks, PA-C 09/27/19 1940    [place] , MD 09/27/19 2002

## 2019-09-27 NOTE — ED Triage Notes (Signed)
Pt states that she was doing physical therapy on her left leg doe lower back pain, states that the last exercise she did injured her left posterior knee, pt states yesterday she was walking and felt a sharp pain in the back of her left knee and leg, pt is holding leg straight due to pain to bend the left knee

## 2019-09-29 DIAGNOSIS — M1712 Unilateral primary osteoarthritis, left knee: Secondary | ICD-10-CM | POA: Diagnosis not present

## 2019-09-29 DIAGNOSIS — M25562 Pain in left knee: Secondary | ICD-10-CM | POA: Diagnosis not present

## 2019-09-29 DIAGNOSIS — M25462 Effusion, left knee: Secondary | ICD-10-CM | POA: Diagnosis not present

## 2019-09-29 DIAGNOSIS — M1612 Unilateral primary osteoarthritis, left hip: Secondary | ICD-10-CM | POA: Diagnosis not present

## 2019-10-02 DIAGNOSIS — M2392 Unspecified internal derangement of left knee: Secondary | ICD-10-CM | POA: Diagnosis not present

## 2019-10-05 ENCOUNTER — Other Ambulatory Visit: Payer: Self-pay

## 2019-10-05 NOTE — Patient Outreach (Signed)
  [facility]) Care Management Chronic Special Needs Program  10/05/2019  Name: Margaret Vazquez DOB: [birth year]  MRN: [id number]  Margaret Vazquez is enrolled in a chronic special needs plan for Diabetes. Received referral from [employer]) 09/29/19 concerning cost of clients Brilinta  Client called with no answer No answer and HIPAA compliant message left. Plan for 2nd outreach call in 2 weeks Chronic care management coordinator will attempt outreach in 2 weeks.   Peter Garter RN, Jackquline Denmark, CDE Chronic Care Management Coordinator [facility] Care Management [phone]

## 2019-10-11 DIAGNOSIS — C44629 Squamous cell carcinoma of skin of left upper limb, including shoulder: Secondary | ICD-10-CM | POA: Diagnosis not present

## 2019-10-11 DIAGNOSIS — C44729 Squamous cell carcinoma of skin of left lower limb, including hip: Secondary | ICD-10-CM | POA: Diagnosis not present

## 2019-10-14 DIAGNOSIS — M25462 Effusion, left knee: Secondary | ICD-10-CM | POA: Diagnosis not present

## 2019-10-14 DIAGNOSIS — M1712 Unilateral primary osteoarthritis, left knee: Secondary | ICD-10-CM | POA: Diagnosis not present

## 2019-10-14 DIAGNOSIS — M25562 Pain in left knee: Secondary | ICD-10-CM | POA: Diagnosis not present

## 2019-10-17 ENCOUNTER — Other Ambulatory Visit: Payer: Self-pay | Admitting: Orthopedic Surgery

## 2019-10-17 DIAGNOSIS — M1712 Unilateral primary osteoarthritis, left knee: Secondary | ICD-10-CM

## 2019-10-17 DIAGNOSIS — M25562 Pain in left knee: Secondary | ICD-10-CM

## 2019-10-17 DIAGNOSIS — M25462 Effusion, left knee: Secondary | ICD-10-CM

## 2019-10-20 ENCOUNTER — Other Ambulatory Visit: Payer: Self-pay

## 2019-10-20 NOTE — Patient Outreach (Signed)
[facility]) Care Management Chronic Special Needs Program  10/20/2019  Name: Margaret Vazquez DOB: 14-Jan-1936  MRN: [id number]  Margaret Vazquez is enrolled in a chronic special needs plan for Diabetes. Chronic Care Management Coordinator telephoned client to review health risk assessment and to develop individualized care plan.  Introduced the chronic care management program, importance of client participation, and taking their care plan to all provider appointments and inpatient facilities.  Reviewed the transition of care process and possible referral to community care management.  Subjective: Client states that she has been going to see an orthopedic doctor for her lt knee pain.  States she needs to get a MRI done.  States is having less pain since she has been taking prednisone.  States she wants to get back to being active and working in her yard.  States her blood sugars range from 80-110 in the morning but have been higher since she is on the prednisone taper.  States she has low blood sugars every few weeks if she does not eat enough breakfast.  States that her B/P has been good when checked.  Denies any chest pains or shortness of breath.  States she has only had one episode of atrial fib and that was several years ago when she was having an allergic reaction and received epinephrine.  States she can afford her medications and can afford her Brilinta. States she has had her COVID shots  Goals Addressed            This Visit's Progress   . Advanced Care Planning complete by 9 months(continue 05/18/19)   Not on track    Reviewed importance of having Advanced Directives forms completed    . Client understands the importance of follow-up with providers by attending scheduled visits   On track    Reinforced to keep scheduled appointments with providers    . Client will not report change from baseline and no repeated symptoms of stroke with in the next 9 months    On track      Reports no stroke symptoms Reviewed stoke symptoms and when to call 911 Sent EMMI-Signs of Stroke    . Client will report no fall or injuries in the next 12 months.(continue 10/20/19)   Not on track    Reports one fall due to knee injury Educated on fall precautions and home safety Stand up slowly Use cane or walker at all times Use grabber to pick up objects on the floor Keep home well-lit and wear your glasses  Sent EMMI-Preventing falls    . Client will report no worsening of symptoms related to heart disease within the next 12 months(continue 10/20/19)   On track    Reports no issues with heart disease Reviewed signs and symptoms to call doctor and when to call 911 Follow a low salt diet     . COMPLETED: Client will use Assistive Devices as needed and verbalize understanding of device use   On track    Reports no issues with glucometer Goal completed 10/20/19    . Client will verbalize knowledge of self management of Hypertension as evidences by BP reading of 140/90 or less; or as defined by provider   On track    Reports B/P less than 140/90 Plan to check B/P regularly Take B/P medications as ordered Plan to follow a low salt diet Sent EMMI: High Blood Pressure (Hypertension) What you can do     . HEMOGLOBIN A1C <  7.0        Last Hemoglobin A1C 6.4% on 09/14/19 Reviewed fasting blood sugar goals of 80-130 and less than 180 1 1/2-2 hours after meals Reinforced to follow a low carbohydrate low salt diet and to watch portion sizes Reviewed use and possible side effects of diabetes medications  Reviewed signs and symptoms of hypoglycemia and actions to take Reviewed diabetes action plan in [place] calendar    . Maintain timely refills of diabetic medication as prescribed within the year .   On track    Maintaining timely refills of medications per dispense report Reinforced importance of getting medications refilled on time Goal completed 10/20/19    . Obtain  annual  Lipid Profile, LDL-C   On track    Last completed 09/14/19 LDL 114 The goal for LDL is less than 70 mg/dL as you are at high risk for complications Try to avoid saturated fats, trans-fats and eat more fiber Client intolerant to statins    . Obtain Annual Eye (retinal)  Exam    On track    Last completed 02/25/19 Plan to have a dilated eye exam every year    . Obtain Annual Foot Exam   On track    Completed 09/21/19 Check your skin and feet every day for cuts, bruises, redness, blisters, or sores. Report to provider any problems with your feet Schedule a foot exam with your health care provider once every year    . Obtain annual screen for micro albuminuria (urine) , nephropathy (kidney problems)   On track    It is important for your doctor to check your urine for protein at least every year    . Obtain Hemoglobin A1C at least 2 times per year   On track    Last completed 09/14/19 It is important to have your Hemoglobin A1C checked every 6 months if you are at goal and every 3 months if you are not at goal    . Patient Stated to heal from lt knee injury and have better mobiltiy and less pain       Plan to keep appointments with orthopedic doctor Use ice on knee to help with discomfort as needed Advance activity as tolerated     . Visit Primary Care Provider or Endocrinologist at least 2 times per year    On track    Primary care visit 09/21/19 Annual Medicare Wellness visit 09/21/19     Client is meeting diabetes self management goal of hemoglobin A1C of <7% with last reading of 6.4% Reviewed number for 24-hour nurse Line Reviewed COVID 19 precautions  Plan:  Send successful outreach letter with a copy of their individualized care plan, Send individual care plan to provider and Send educational material-EMMI-High Blood Pressure(Hypertension): What you can do, Preventing falls, Signs of Stroke  Chronic care management coordination will outreach in:  12 months    [place], Jackquline Denmark, [place] [facility] [phone]

## 2019-10-24 DIAGNOSIS — M25562 Pain in left knee: Secondary | ICD-10-CM | POA: Diagnosis not present

## 2019-10-24 DIAGNOSIS — M1712 Unilateral primary osteoarthritis, left knee: Secondary | ICD-10-CM | POA: Diagnosis not present

## 2019-10-25 ENCOUNTER — Other Ambulatory Visit: Payer: Self-pay

## 2019-10-25 ENCOUNTER — Ambulatory Visit
Admission: RE | Admit: 2019-10-25 | Discharge: 2019-10-25 | Disposition: A | Discharge status: Home / Self Care | Payer: HMO | Source: Ambulatory Visit | Attending: Orthopedic Surgery | Admitting: Orthopedic Surgery

## 2019-10-25 DIAGNOSIS — M1712 Unilateral primary osteoarthritis, left knee: Secondary | ICD-10-CM | POA: Insufficient documentation

## 2019-10-25 DIAGNOSIS — M25562 Pain in left knee: Secondary | ICD-10-CM | POA: Diagnosis not present

## 2019-10-25 DIAGNOSIS — M25462 Effusion, left knee: Secondary | ICD-10-CM | POA: Diagnosis not present

## 2019-10-26 DIAGNOSIS — M1712 Unilateral primary osteoarthritis, left knee: Secondary | ICD-10-CM | POA: Diagnosis not present

## 2019-10-26 DIAGNOSIS — M25562 Pain in left knee: Secondary | ICD-10-CM | POA: Diagnosis not present

## 2019-11-01 ENCOUNTER — Ambulatory Visit: Payer: Self-pay

## 2019-11-23 ENCOUNTER — Other Ambulatory Visit (INDEPENDENT_AMBULATORY_CARE_PROVIDER_SITE_OTHER): Payer: HMO

## 2019-11-23 ENCOUNTER — Other Ambulatory Visit: Payer: Self-pay

## 2019-11-23 DIAGNOSIS — M1712 Unilateral primary osteoarthritis, left knee: Secondary | ICD-10-CM | POA: Diagnosis not present

## 2019-11-23 DIAGNOSIS — R300 Dysuria: Secondary | ICD-10-CM | POA: Diagnosis not present

## 2019-11-23 DIAGNOSIS — M25562 Pain in left knee: Secondary | ICD-10-CM | POA: Diagnosis not present

## 2019-11-23 LAB — POC URINALSYSI DIPSTICK (AUTOMATED)
Bilirubin, UA: NEGATIVE
Glucose, UA: NEGATIVE
Ketones, UA: NEGATIVE
Nitrite, UA: NEGATIVE
Protein, UA: NEGATIVE
Spec Grav, UA: >=1.03 — AB (ref 1.010–1.025)
Urobilinogen, UA: 0.2 E.U./dL
pH, UA: 6 (ref 5.0–8.0)

## 2019-11-25 ENCOUNTER — Other Ambulatory Visit: Payer: Self-pay | Admitting: Family Medicine

## 2019-11-25 LAB — URINE CULTURE
MICRO NUMBER:: 10496220
SPECIMEN QUALITY:: ADEQUATE

## 2019-11-25 NOTE — Telephone Encounter (Signed)
Urine culture is positive-(uti) I pended px for bactrim ds to send to pharmacy of choice Drink fluids Let us know if no improvement in symptoms

## 2019-11-25 NOTE — Telephone Encounter (Signed)
Left VM on home and cell # requesting pt to call the office back

## 2019-11-28 MED ORDER — SULFAMETHOXAZOLE-TRIMETHOPRIM 800-160 MG PO TABS: 1.0000 | ORAL_TABLET | Freq: Two times a day (BID) | ORAL | 0 refills | Status: DC

## 2019-11-28 NOTE — Telephone Encounter (Signed)
Rx sent 

## 2019-11-28 NOTE — Telephone Encounter (Signed)
I let patient know her results.  Please send rx to [facility] by [employer].

## 2019-12-10 ENCOUNTER — Other Ambulatory Visit: Payer: Self-pay | Admitting: Internal Medicine

## 2019-12-26 DIAGNOSIS — M25562 Pain in left knee: Secondary | ICD-10-CM | POA: Diagnosis not present

## 2020-01-16 ENCOUNTER — Encounter: Payer: Self-pay | Admitting: Student

## 2020-01-16 ENCOUNTER — Other Ambulatory Visit: Payer: Self-pay

## 2020-01-16 ENCOUNTER — Telehealth: Payer: Self-pay

## 2020-01-16 ENCOUNTER — Ambulatory Visit: Payer: HMO | Admitting: Student

## 2020-01-16 VITALS — BP 122/70 | HR 62 | Ht 68.0 in | Wt 186.0 lb

## 2020-01-16 DIAGNOSIS — I10 Essential (primary) hypertension: Secondary | ICD-10-CM | POA: Diagnosis not present

## 2020-01-16 DIAGNOSIS — Z9889 Other specified postprocedural states: Secondary | ICD-10-CM

## 2020-01-16 DIAGNOSIS — I639 Cerebral infarction, unspecified: Secondary | ICD-10-CM

## 2020-01-16 DIAGNOSIS — E785 Hyperlipidemia, unspecified: Secondary | ICD-10-CM | POA: Diagnosis not present

## 2020-01-16 DIAGNOSIS — I2510 Atherosclerotic heart disease of native coronary artery without angina pectoris: Secondary | ICD-10-CM

## 2020-01-16 NOTE — Telephone Encounter (Signed)
Referral fr [facility] per Oda Kilts, PA-C

## 2020-01-16 NOTE — Progress Notes (Signed)
PCP:  Abner Greenspan, MD Primary Cardiologist: Virl Axe, MD Electrophysiologist: Virl Axe, MD   Margaret Vazquez is a 84 y.o. female seen today for Virl Axe, MD for routine electrophysiology followup.  Since last being seen in our clinic the patient reports doing well overall. She denies any further chest pain now on imdur. She denies palpitations, dyspnea, PND, orthopnea, nausea, vomiting, dizziness, syncope, edema, weight gain, or early satiety. She bruises easily on brilinta. She has had a bruise on the back of her left calf for about a month. She also has mild R neck soreness, but relates this to sleeping on a new pillow  Past Medical History:  Diagnosis Date  . Allergy history, drug    Aspirin  . Basal cell carcinoma    "back"  . CAD (coronary artery disease)    a. Previously nonobstructive then progressive angina with abnl CT -> cardiac cath 12/24/17 showed 30% mid RCA and 80% prox-mid Cx. She received DES to mid AV groove Cx. EF 55-65%.   . Cervical stenosis of spine    With neck pain  . CKD (chronic kidney disease), stage II   . Colon polyps 2009  . Complication of anesthesia 1980s   slow to wake after anesthesia "when I had breast biopsy"  . GERD (gastroesophageal reflux disease)   . Hyperlipidemia    myalgias with Lipitor and Zetia  . Hypertension   . Migraine    "stopped at age 45" (12/24/2017)  . Squamous carcinoma    "iced off and cut off; mostly arms" (12/24/2017)  . Stroke [facility])    "told me I'd had 2 strokes in 02/2013"; denies residual on 12/24/2017  . TIA (transient ischemic attack)   . Type II diabetes mellitus ([place])    Past Surgical History:  Procedure Laterality Date  . ABD [place]  07/2003   Negative  . APPENDECTOMY    . BASAL CELL CARCINOMA EXCISION     "back"  . BREAST BIOPSY Left 1992   benign  . CARDIAC CATHETERIZATION  04/07/2011   non obst CAD (Dr Burt Knack)  . CATARACT EXTRACTION W/PHACO Left 04/01/2017   Procedure: CATARACT EXTRACTION PHACO AND  INTRAOCULAR LENS PLACEMENT ([place]) LEFT DIABETIC;  Surgeon: Leandrew Koyanagi, MD;  Location: [facility];  Service: Ophthalmology;  Laterality: Left;  Diabetic - oral meds  . CATARACT EXTRACTION W/PHACO Right 04/29/2017   Procedure: CATARACT EXTRACTION PHACO AND INTRAOCULAR LENS PLACEMENT ([place]) RIGHT DIABETIC;  Surgeon: Leandrew Koyanagi, MD;  Location: [place];  Service: Ophthalmology;  Laterality: Right;  Diabetic - oral meds  . COLONOSCOPY  12/2007   Adenomatous colon polyps  . CORONARY ANGIOPLASTY WITH STENT PLACEMENT  12/24/2017  . CORONARY STENT INTERVENTION N/A 12/24/2017   Procedure: CORONARY STENT INTERVENTION;  Surgeon: Lorretta Harp, MD;  Location: [place] CV LAB;  Service: Cardiovascular;  Laterality: N/A;  . CYSTOSCOPY W/ DECANNULATION  03/2000   Normal  . LEFT HEART CATH AND CORONARY ANGIOGRAPHY N/A 12/24/2017   Procedure: LEFT HEART CATH AND CORONARY ANGIOGRAPHY;  Surgeon: Lorretta Harp, MD;  Location: [place] CV LAB;  Service: Cardiovascular;  Laterality: N/A;  . LOOP RECORDER IMPLANT N/A 02/16/2013   Procedure: LOOP RECORDER IMPLANT;  Surgeon: Deboraha Sprang, MD;  Location[facility] C[facility] LAB;  Service: Cardiovascular;  Laterality: N/A;  . NASAL SINUS SURGERY  01/2005  . SQUAMOUS CELL CARCINOMA EXCISION     "mostly arms;" (12/24/2017)  . STRESS CARDIOLITE  11/1999   Normal/ negative  .  TEAR DUCT PROBING  2005   "? side"  . TEE WITHOUT CARDIOVERSION N/A 02/16/2013   Procedure: TRANSESOPHAGEAL ECHOCARDIOGRAM (TEE);  Surgeon: Josue Hector, MD;  Location: [facility] ENDOSCOPY;  Service: Cardiovascular;  Laterality: N/A;  . TUBAL LIGATION     BTL    Current Outpatient Medications  Medication Sig Dispense Refill  . neomycin-polymyxin-dexameth (MAXITROL) 0.1 % OINT Place into the left eye as needed.     . traMADol (ULTRAM) 50 MG tablet as needed.    . Betamethasone Sodium Phosphate 6 MG/ML SOLN once a week. Injection in left knee - For 3 weeks only      . Blood Glucose Monitoring Suppl (ONE TOUCH ULTRA 2) w/Device KIT Use to check blood sugar once daily 1 each 0  . BRILINTA 90 MG TABS tablet TAKE 1 TABLET BY MOUTH TWICE DAILY 60 tablet 3  . diphenhydrAMINE (BENADRYL) 25 MG tablet Take 50 mg by mouth every 8 (eight) hours as needed for allergies.     Marland Kitchen EPINEPHrine (EPI-PEN) 0.3 mg/0.3 mL DEVI Inject 0.3 mg into the muscle daily as needed (allergic reaction).     Marland Kitchen erythromycin ophthalmic ointment Place into the left eye as needed. Use on eyelid as needed    . fluticasone (FLONASE) 50 MCG/ACT nasal spray Place 2 sprays into both nostrils 2 (two) times daily as needed. Reported on 09/10/2015    . glipiZIDE (GLUCOTROL XL) 10 MG 24 hr tablet Take 1 tablet (10 mg total) by mouth daily with breakfast. 90 tablet 3  . isosorbide mononitrate (IMDUR) 60 MG 24 hr tablet Take 1 tablet (60 mg total) by mouth daily. 90 tablet 3  . losartan-hydrochlorothiazide (HYZAAR) 50-12.5 MG tablet Take 0.5 tablets by mouth daily. 45 tablet 3  . metFORMIN (GLUCOPHAGE-XR) 500 MG 24 hr tablet TAKE 1 TABLET(500 MG) BY MOUTH DAILY WITH BREAKFAST 90 tablet 3  . nitroGLYCERIN (NITROSTAT) 0.4 MG SL tablet Place 1 tablet (0.4 mg total) under the tongue every 5 (five) minutes as needed for chest pain (up to 3 doses. If taking 3rd dose call 911). 25 tablet 3  . potassium chloride SA (KLOR-CON) 20 MEQ tablet Take 1 tablet (20 mEq total) by mouth daily. 90 tablet 3   No current facility-administered medications for this visit.    Allergies  Allergen Reactions  . Bee Venom Hives, Shortness Of Breath and Swelling  . Nabumetone Anaphylaxis  . Amoxicillin-Pot Clavulanate Hives and Swelling    To lips.  . Aspirin Hives  . Atorvastatin Swelling     joint pain/swelling, inc liver tests  . Clopidogrel Bisulfate Hives  . Codeine Nausea And Vomiting  . Ezetimibe Other (See Comments)     fatigue  . Metformin And Related Other (See Comments)    Diarrhea   . Valsartan Other (See  Comments)     fatigue  . Other Hives    **Red Meat**  SOB    Social History   Socioeconomic History  . Marital status: Married    Spouse name: Eddie Dibbles  . Number of children: 3  . Years of education: 79  . Highest education level: Not on file  Occupational History  . Occupation: Retired    Fish farm manager: RETIRED  Tobacco Use  . Smoking status: Never Smoker  . Smokeless tobacco: Never Used  Vaping Use  . Vaping Use: Never used  Substance and Sexual Activity  . Alcohol use: Never    Alcohol/week: 0.0 standard drinks  . Drug use: Never  . Sexual activity:  Not Currently  Other Topics Concern  . Not on file  Social History Narrative   3 children   Does not drink caffeinated beverages   Cares for SIL with dementia   Social Determinants of Health   Financial Resource Strain:   . Difficulty of Paying Living Expenses:   Food Insecurity: No Food Insecurity  . Worried About Charity fundraiser in the Last Year: Never true  . Ran Out of Food in the Last Year: Never true  Transportation Needs: No Transportation Needs  . Lack of Transportation (Medical): No  . Lack of Transportation (Non-Medical): No  Physical Activity:   . Days of Exercise per Week:   . Minutes of Exercise per Session:   Stress:   . Feeling of Stress :   Social Connections:   . Frequency of Communication with Friends and Family:   . Frequency of Social Gatherings with Friends and Family:   . Attends Religious Services:   . Active Member of Clubs or Organizations:   . Attends Archivist Meetings:   Marland Kitchen Marital Status:   Intimate Partner Violence:   . Fear of Current or Ex-Partner:   . Emotionally Abused:   Marland Kitchen Physically Abused:   . Sexually Abused:    Review of Systems: All other systems reviewed and are otherwise negative except as noted above.  Physical Exam: Vitals:   01/16/20 1136  BP: 122/70  Pulse: 62  SpO2: 96%  Weight: 186 lb (84.4 kg)  Height: _0  (1.727 m)    GEN- The patient is  elderly appearing, alert and oriented x 3 today.   HEENT: normocephalic, atraumatic; sclera clear, conjunctiva pink; hearing intact; oropharynx clear; neck supple, no JVP Lymph- no cervical lymphadenopathy Lungs- Clear to ausculation bilaterally, normal work of breathing.  No wheezes, rales, rhonchi Heart- Regular rate and rhythm, no murmurs, rubs or gallops, PMI not laterally displaced GI- soft, non-tender, non-distended, bowel sounds present, no hepatosplenomegaly Extremities- no clubbing, cyanosis, or edema; DP/PT/radial pulses 2+ bilaterally MS- no significant deformity or atrophy Skin- warm and dry, no rash or lesion Psych- euthymic mood, full affect Neuro- strength and sensation are intact  EKG is not ordered. Personal review of EKG from 08/03/2019 shows NSR 64 bpm  Additional studies reviewed include: Previous EP notes, most recent labwork.   Assessment and Plan:  1. H/o cryptogenic stroke ILR EOS.  She had 1 episode of atrial fibrillation in the setting of treatment for anaphylaxis with epinephrine. It was thought that this was NOT an indication for [facility].   2. CAD with angina Cath 12/2017 with Prox to mid Cx stent placed. Denies  ischemic symptoms Continue imdur 60 mg daily  3. HTN Continue current medications  4. HLD Chronic statin intolerance Have discussed PCSK9i therapy. She was in the [place] study ([place]) and exited due to hip pain. I'm not sure she would qualify for any other PCSK9, but will inquire.   Shirley Friar, PA-C  01/16/20 11:42 AM

## 2020-01-16 NOTE — Patient Instructions (Signed)
Medication Instructions:  *If you need a refill on your cardiac medications before your next appointment, please call your pharmacy*  Lab Work: If you have labs (blood work) drawn today and your tests are completely normal, you will receive your results only by: Marland Kitchen MyChart Message (if you have MyChart) OR . A paper copy in the mail If you have any lab test that is abnormal or we need to change your treatment, we will call you to review the results.  Follow-Up: At [facility], you and your health needs are our priority.  As part of our continuing mission to provide you with exceptional heart care, we have created designated Provider Care Teams.  These Care Teams include your primary Cardiologist (physician) and Advanced Practice Providers (APPs -  Physician Assistants and Nurse Practitioners) who all work together to provide you with the care you need, when you need it.  We recommend signing up for the patient portal called "MyChart".  Sign up information is provided on this After Visit Summary.  MyChart is used to connect with patients for Virtual Visits (Telemedicine).  Patients are able to view lab/test results, encounter notes, upcoming appointments, etc.  Non-urgent messages can be sent to your provider as well.   To learn more about what you can do with MyChart, go to [email or web address].    Your next appointment:   Your physician wants you to follow-up in: 6 MONTHS with Dr. Caryl Comes.  You will receive a reminder letter in the mail two months in advance. If you don't receive a letter, please call our office to schedule the follow-up appointment.  The format for your next appointment:   In Person with Dr. Caryl Comes.

## 2020-01-16 NOTE — Telephone Encounter (Signed)
-----   Message from Shirley Friar, PA-C sent at 01/16/2020  2:39 PM EDT -----  Can we please refer her to Lipid clinic for statin intolerance?    Thank you!  ----- Message ----- From: Ramond Dial, RPH-CPP Sent: 01/16/2020  12:11 PM EDT To: Shirley Friar, PA-C  Beckie Busing is out of the office, but I think she would still be a candidate for PCSK9i.  Melissa ----- Message ----- From: Shirley Friar, Hershal Coria Sent: 01/16/2020  11:52 AM EDT To: Leeroy Bock, RPH-CPP   Good afternoon!  This is a patient who has chronic HLD and intolerant to statins and Zetia, with known CAD.   She was in the [place] clinical trial but had to stop "Inclisiran" due to hip pain.  Would she be a candidate for Repatha? Or have we exhausted her lipid treatment options?   Thank you!  Legrand Como [address] [place], PA-C  01/16/2020 11:54 AM

## 2020-01-18 ENCOUNTER — Other Ambulatory Visit: Payer: Self-pay | Admitting: Family Medicine

## 2020-01-18 DIAGNOSIS — Z1231 Encounter for screening mammogram for malignant neoplasm of breast: Secondary | ICD-10-CM

## 2020-01-20 DIAGNOSIS — M25562 Pain in left knee: Secondary | ICD-10-CM | POA: Diagnosis not present

## 2020-01-25 ENCOUNTER — Ambulatory Visit: Payer: HMO

## 2020-01-25 DIAGNOSIS — M25562 Pain in left knee: Secondary | ICD-10-CM | POA: Diagnosis not present

## 2020-02-01 ENCOUNTER — Telehealth: Payer: Self-pay | Admitting: *Deleted

## 2020-02-01 DIAGNOSIS — Z006 Encounter for examination for normal comparison and control in clinical research program: Secondary | ICD-10-CM

## 2020-02-01 NOTE — Telephone Encounter (Signed)
Left message for pt to call [phone] for [facility] 4 phone call visit.

## 2020-02-08 ENCOUNTER — Telehealth: Payer: Self-pay | Admitting: *Deleted

## 2020-02-08 DIAGNOSIS — Z006 Encounter for examination for normal comparison and control in clinical research program: Secondary | ICD-10-CM

## 2020-02-08 NOTE — Telephone Encounter (Signed)
Spoke with patient for [place] 4 phone visit. Pt does not wish to re-start IP at this time. No SAE to report. Medications updated in [place]. Next phone visit scheduled for January 2022.

## 2020-02-10 DIAGNOSIS — M1712 Unilateral primary osteoarthritis, left knee: Secondary | ICD-10-CM | POA: Diagnosis not present

## 2020-02-13 ENCOUNTER — Other Ambulatory Visit: Payer: Self-pay

## 2020-02-13 MED ORDER — TICAGRELOR 90 MG PO TABS: 90.0000 mg | ORAL_TABLET | Freq: Two times a day (BID) | ORAL | 0 refills | Status: DC

## 2020-02-13 NOTE — Telephone Encounter (Signed)
Patient contacted the office and states that she is in [place], and she forgot her [place]. She states she needs 14 tablets sent to the [facility] in [place], so it will last her until she gets home. Dr. Glori Bickers, Is this ok?

## 2020-02-13 NOTE — Telephone Encounter (Signed)
No problem I sent it

## 2020-02-15 DIAGNOSIS — T781XXA Other adverse food reactions, not elsewhere classified, initial encounter: Secondary | ICD-10-CM | POA: Diagnosis not present

## 2020-02-15 DIAGNOSIS — Z885 Allergy status to narcotic agent status: Secondary | ICD-10-CM | POA: Diagnosis not present

## 2020-02-15 DIAGNOSIS — T7840XA Allergy, unspecified, initial encounter: Secondary | ICD-10-CM | POA: Diagnosis not present

## 2020-02-17 ENCOUNTER — Ambulatory Visit: Payer: HMO

## 2020-02-27 DIAGNOSIS — E119 Type 2 diabetes mellitus without complications: Secondary | ICD-10-CM | POA: Diagnosis not present

## 2020-02-27 LAB — HM DIABETES EYE EXAM

## 2020-02-29 ENCOUNTER — Other Ambulatory Visit: Payer: Self-pay

## 2020-02-29 ENCOUNTER — Ambulatory Visit
Admission: RE | Admit: 2020-02-29 | Discharge: 2020-02-29 | Disposition: A | Discharge status: Home / Self Care | Payer: HMO | Source: Ambulatory Visit | Attending: Family Medicine | Admitting: Family Medicine

## 2020-02-29 DIAGNOSIS — Z1231 Encounter for screening mammogram for malignant neoplasm of breast: Secondary | ICD-10-CM

## 2020-03-07 ENCOUNTER — Encounter: Payer: Self-pay | Admitting: Family Medicine

## 2020-03-20 ENCOUNTER — Telehealth: Payer: Self-pay | Admitting: Family Medicine

## 2020-03-20 DIAGNOSIS — E1151 Type 2 diabetes mellitus with diabetic peripheral angiopathy without gangrene: Secondary | ICD-10-CM

## 2020-03-20 DIAGNOSIS — E1169 Type 2 diabetes mellitus with other specified complication: Secondary | ICD-10-CM

## 2020-03-20 DIAGNOSIS — E785 Hyperlipidemia, unspecified: Secondary | ICD-10-CM

## 2020-03-20 DIAGNOSIS — I10 Essential (primary) hypertension: Secondary | ICD-10-CM

## 2020-03-20 NOTE — Telephone Encounter (Signed)
-----   Message from Cloyd Stagers, RT sent at 03/20/2020 10:11 AM EDT ----- Regarding: Lab Orders for Wednesday 9.15.2021 Please place lab orders for Wednesday 9.15.2021, office visit for 6 month f/u on Monday 9.20.2021 Thank you, Dyke Maes RT(R)

## 2020-03-21 ENCOUNTER — Other Ambulatory Visit: Payer: Self-pay

## 2020-03-21 ENCOUNTER — Emergency Department
Admission: EM | Admit: 2020-03-21 | Discharge: 2020-03-21 | Disposition: A | Discharge status: Home / Self Care | Payer: HMO | Attending: Emergency Medicine | Admitting: Emergency Medicine

## 2020-03-21 ENCOUNTER — Emergency Department: Payer: HMO

## 2020-03-21 ENCOUNTER — Other Ambulatory Visit: Payer: HMO

## 2020-03-21 ENCOUNTER — Telehealth: Payer: Self-pay

## 2020-03-21 DIAGNOSIS — R0789 Other chest pain: Secondary | ICD-10-CM | POA: Diagnosis not present

## 2020-03-21 DIAGNOSIS — N182 Chronic kidney disease, stage 2 (mild): Secondary | ICD-10-CM | POA: Diagnosis not present

## 2020-03-21 DIAGNOSIS — Z20822 Contact with and (suspected) exposure to covid-19: Secondary | ICD-10-CM | POA: Diagnosis not present

## 2020-03-21 DIAGNOSIS — Z7951 Long term (current) use of inhaled steroids: Secondary | ICD-10-CM | POA: Diagnosis not present

## 2020-03-21 DIAGNOSIS — E1151 Type 2 diabetes mellitus with diabetic peripheral angiopathy without gangrene: Secondary | ICD-10-CM | POA: Insufficient documentation

## 2020-03-21 DIAGNOSIS — I480 Paroxysmal atrial fibrillation: Secondary | ICD-10-CM

## 2020-03-21 DIAGNOSIS — Z85820 Personal history of malignant melanoma of skin: Secondary | ICD-10-CM | POA: Diagnosis not present

## 2020-03-21 DIAGNOSIS — Z7984 Long term (current) use of oral hypoglycemic drugs: Secondary | ICD-10-CM | POA: Insufficient documentation

## 2020-03-21 DIAGNOSIS — I2510 Atherosclerotic heart disease of native coronary artery without angina pectoris: Secondary | ICD-10-CM | POA: Diagnosis not present

## 2020-03-21 DIAGNOSIS — E1122 Type 2 diabetes mellitus with diabetic chronic kidney disease: Secondary | ICD-10-CM | POA: Insufficient documentation

## 2020-03-21 DIAGNOSIS — I129 Hypertensive chronic kidney disease with stage 1 through stage 4 chronic kidney disease, or unspecified chronic kidney disease: Secondary | ICD-10-CM | POA: Diagnosis not present

## 2020-03-21 DIAGNOSIS — Z79899 Other long term (current) drug therapy: Secondary | ICD-10-CM | POA: Diagnosis not present

## 2020-03-21 DIAGNOSIS — I4891 Unspecified atrial fibrillation: Secondary | ICD-10-CM | POA: Diagnosis not present

## 2020-03-21 DIAGNOSIS — Z955 Presence of coronary angioplasty implant and graft: Secondary | ICD-10-CM | POA: Diagnosis not present

## 2020-03-21 DIAGNOSIS — J9811 Atelectasis: Secondary | ICD-10-CM | POA: Diagnosis not present

## 2020-03-21 DIAGNOSIS — Z8673 Personal history of transient ischemic attack (TIA), and cerebral infarction without residual deficits: Secondary | ICD-10-CM | POA: Diagnosis not present

## 2020-03-21 LAB — MAGNESIUM: Magnesium: 2.2 mg/dL (ref 1.7–2.4)

## 2020-03-21 LAB — CBC
HCT: 44.3 % (ref 36.0–46.0)
Hemoglobin: 15 g/dL (ref 12.0–15.0)
MCH: 31.9 pg (ref 26.0–34.0)
MCHC: 33.9 g/dL (ref 30.0–36.0)
MCV: 94.3 fL (ref 80.0–100.0)
Platelets: 219 10*3/uL (ref 150–400)
RBC: 4.7 MIL/uL (ref 3.87–5.11)
RDW: 12 % (ref 11.5–15.5)
WBC: 7.2 10*3/uL (ref 4.0–10.5)
nRBC: 0 % (ref 0.0–0.2)

## 2020-03-21 LAB — BASIC METABOLIC PANEL
Anion gap: 11 (ref 5–15)
BUN: 19 mg/dL (ref 8–23)
CO2: 24 mmol/L (ref 22–32)
Calcium: 9.3 mg/dL (ref 8.9–10.3)
Chloride: 105 mmol/L (ref 98–111)
Creatinine, Ser: 1.01 mg/dL — ABNORMAL HIGH (ref 0.44–1.00)
GFR calc Af Amer: >60 mL/min (ref >60)
GFR calc non Af Amer: 52 mL/min — ABNORMAL LOW (ref >60)
Glucose, Bld: 112 mg/dL — ABNORMAL HIGH (ref 70–99)
Potassium: 4.1 mmol/L (ref 3.5–5.1)
Sodium: 140 mmol/L (ref 135–145)

## 2020-03-21 LAB — TROPONIN I (HIGH SENSITIVITY)
Troponin I (High Sensitivity): 7 ng/L (ref <18)
Troponin I (High Sensitivity): 8 ng/L (ref <18)

## 2020-03-21 LAB — SARS CORONAVIRUS 2 BY RT PCR (HOSPITAL ORDER, PERFORMED IN ~~LOC~~ HOSPITAL LAB): SARS Coronavirus 2: NEGATIVE

## 2020-03-21 MED ORDER — ISOSORBIDE MONONITRATE ER 60 MG PO TB24
60.0000 mg | ORAL_TABLET | Freq: Once | ORAL | Status: AC
  Administered 2020-03-21: 60 mg via ORAL
  Filled 2020-03-21: qty 1

## 2020-03-21 MED ORDER — TICAGRELOR 90 MG PO TABS
90.0000 mg | ORAL_TABLET | Freq: Once | ORAL | Status: AC
  Administered 2020-03-21: 90 mg via ORAL
  Filled 2020-03-21: qty 1

## 2020-03-21 MED ORDER — HYDROCHLOROTHIAZIDE 10 MG/ML ORAL SUSPENSION
6.2500 mg | Freq: Once | ORAL | Status: AC
  Administered 2020-03-21: 6.25 mg via ORAL
  Filled 2020-03-21: qty 1.25

## 2020-03-21 MED ORDER — GLIPIZIDE ER 10 MG PO TB24
10.0000 mg | ORAL_TABLET | Freq: Once | ORAL | Status: AC
  Administered 2020-03-21: 10 mg via ORAL
  Filled 2020-03-21: qty 1

## 2020-03-21 MED ORDER — METFORMIN HCL ER 500 MG PO TB24
500.0000 mg | ORAL_TABLET | Freq: Once | ORAL | Status: AC
  Administered 2020-03-21: 500 mg via ORAL
  Filled 2020-03-21: qty 1

## 2020-03-21 MED ORDER — LOSARTAN POTASSIUM-HCTZ 50-12.5 MG PO TABS: 0.5000 | ORAL_TABLET | Freq: Once | ORAL | Status: DC

## 2020-03-21 MED ORDER — LOSARTAN POTASSIUM 50 MG PO TABS
25.0000 mg | ORAL_TABLET | Freq: Once | ORAL | Status: AC
  Administered 2020-03-21: 25 mg via ORAL
  Filled 2020-03-21: qty 1

## 2020-03-21 NOTE — Discharge Instructions (Signed)
You were seen in the ED because of your chest pain this morning.,  As we discussed you have evidence of new paroxysmal A. fib.  This means your heart is going in and out of the rhythm that we discussed.  This does put you at a small risk for increased stroke.   Please follow-up with Dr. Caryl Comes, your cardiologist, soon as possible. Continue all of your normal medications.   If you develop any worsening chest pains, sensations of palpitations or your heart racing, passing out or strokelike symptoms, please return to the ED immediately.

## 2020-03-21 NOTE — Telephone Encounter (Signed)
Thanks for letting me know - I will review correspondence

## 2020-03-21 NOTE — Telephone Encounter (Signed)
Patient contacted the office and states that she just wants Dr. Glori Bickers to be aware that she was in the hospital for her a-fib. She was discharged this morning.  FYI to Dr. Glori Bickers.

## 2020-03-21 NOTE — ED Notes (Signed)
E-signature not working at this time. Pt verbalized understanding of D/C instructions, prescriptions and follow up care with no further questions at this time. Pt in NAD and ambulatory at time of D/C.  

## 2020-03-21 NOTE — ED Triage Notes (Signed)
Pt states central chest pain radiating to throat that woke her up at 0500. Pt denies shob, known fever. Pt with history of stent placement. Took two ntg at home without relief.

## 2020-03-21 NOTE — ED Provider Notes (Signed)
[facility] Emergency Department Provider Note ____________________________________________   I have reviewed the triage vital signs and the nursing notes.  HISTORY  Chief Complaint Chest Pain   HPI Margaret Vazquez is a 84 y.o. femalewho presents to the ED for evaluation of chest pain.   Chart review indicates hx DM, HTN, HLD, TIA.  CAD with 12/24/2017 stent placement.  Single history of A. fib in the setting of epinephrine treatment for anaphylaxis remotely.  Not on rate control or anticoagulation.  Aspirin allergy on Brilinta only..  Patient reports being in her typical state of health until waking up this morning at 5 AM with sensation of chest pain and pressure.  She reports initially having 8/10 sensation of pressure to her central chest that is nonradiating and constant.  Patient reports associated nausea temporarily without vomiting.  Denies associated diaphoresis, syncope, shortness of breath, falls, head trauma, headache.  She reports taking sublingual nitroglycerin at home without improvement of her pain.  Denies taking other morning medications.  Patient reports the pain eased off in the past "few minutes" and she is reporting improving, 2/10 intensity pressure.  Telemetry notes that she is back in a sinus rhythm while her triage EKG demonstrates A. fib with RVR.    Past Medical History:  Diagnosis Date  . Allergy history, drug    Aspirin  . Basal cell carcinoma    "back"  . CAD (coronary artery disease)    a. Previously nonobstructive then progressive angina with abnl CT -> cardiac cath 12/24/17 showed 30% mid RCA and 80% prox-mid Cx. She received DES to mid AV groove Cx. EF 55-65%.   . Cervical stenosis of spine    With neck pain  . CKD (chronic kidney disease), stage II   . Colon polyps 2009  . Complication of anesthesia 1980s   slow to wake after anesthesia "when I had breast biopsy"  . GERD (gastroesophageal reflux disease)   .  Hyperlipidemia    myalgias with Lipitor and Zetia  . Hypertension   . Migraine    "stopped at age 10" (12/24/2017)  . Squamous carcinoma    "iced off and cut off; mostly arms" (12/24/2017)  . Stroke [facility])    "told me I'd had 2 strokes in 02/2013"; denies residual on 12/24/2017  . TIA (transient ischemic attack)   . Type II diabetes mellitus [facility])     Patient Active Problem List   Diagnosis Date Noted  . History of loop recorder 09/06/2019  . Cryptogenic stroke ([place]) 08/02/2019  . Focal motor seizure ([place]) 03/23/2019  . Breast lump on left side at 10 o'clock position 11/17/2018  . Left-sided epistaxis 11/17/2018  . Medicare annual wellness visit, subsequent 09/13/2018  . Screening mammogram, encounter for 09/13/2018  . Herpes zoster 08/10/2018  . Plantar wart, left foot 06/15/2018  . Atherosclerotic heart disease of native coronary artery with other forms of angina pectoris ([place]) 12/24/2017  . Routine general medical examination at a health care facility 08/24/2016  . PAF (paroxysmal atrial fibrillation) ([place]) 04/07/2016  . Overweight (BMI 25.0-29.9) 08/23/2014  . Type 2 diabetes mellitus with diabetic peripheral angiopathy without gangrene, without long-term current use of insulin ([place]) 01/12/2014  . TIA (transient ischemic attack) 01/11/2014  . CVA (cerebral infarction) 01/11/2014  . LOOP Recorder LINQ 06/07/2013  . Fatigue 06/07/2013  . Sinusitis, chronic 05/16/2013  . H/O: CVA (cerebrovascular accident) 02/15/2013  . Encounter for Medicare annual wellness exam 10/27/2012  . Encopresis(307.7) 12/15/2011  .  Post-menopausal 10/28/2011  . PERSONAL HX COLONIC POLYPS 09/04/2009  . B12 deficiency 03/06/2009  . ALLERGIC RHINITIS 02/14/2008  . BACK PAIN, LUMBAR 11/03/2007  . Hyperlipidemia associated with type 2 diabetes mellitus ([place]) 11/24/2006  . Essential hypertension 11/24/2006  . GERD 10/09/2006  . OVERACTIVE BLADDER 10/09/2006  . INCONTINENCE, URGE 10/09/2006  . SKIN  CANCER, HX OF 10/09/2006    Past Surgical History:  Procedure Laterality Date  . ABD [place]  07/2003   Negative  . APPENDECTOMY    . BASAL CELL CARCINOMA EXCISION     "back"  . BREAST BIOPSY Left 1992   benign  . CARDIAC CATHETERIZATION  04/07/2011   non obst CAD (Dr Burt Knack)  . CATARACT EXTRACTION W/PHACO Left 04/01/2017   Procedure: CATARACT EXTRACTION PHACO AND INTRAOCULAR LENS PLACEMENT ([place]) LEFT DIABETIC;  Surgeon: Leandrew Koyanagi, MD;  Location: [place];  Service: Ophthalmology;  Laterality: Left;  Diabetic - oral meds  . CATARACT EXTRACTION W/PHACO Right 04/29/2017   Procedure: CATARACT EXTRACTION PHACO AND INTRAOCULAR LENS PLACEMENT ([place]) RIGHT DIABETIC;  Surgeon: Leandrew Koyanagi, MD;  Location: [place];  Service: Ophthalmology;  Laterality: Right;  Diabetic - oral meds  . COLONOSCOPY  12/2007   Adenomatous colon polyps  . CORONARY ANGIOPLASTY WITH STENT PLACEMENT  12/24/2017  . CORONARY STENT INTERVENTION N/A 12/24/2017   Procedure: CORONARY STENT INTERVENTION;  Surgeon: Lorretta Harp, MD;  Location: [place] CV LAB;  Service: Cardiovascular;  Laterality: N/A;  . CYSTOSCOPY W/ DECANNULATION  03/2000   Normal  . LEFT HEART CATH AND CORONARY ANGIOGRAPHY N/A 12/24/2017   Procedure: LEFT HEART CATH AND CORONARY ANGIOGRAPHY;  Surgeon: Lorretta Harp, MD;  Location: [place] CV LAB;  Service: Cardiovascular;  Laterality: N/A;  . LOOP RECORDER IMPLANT N/A 02/16/2013   Procedure: LOOP RECORDER IMPLANT;  Surgeon: Deboraha Sprang, MD;  Location[facility] CATH LAB;  Service: Cardiovascular;  Laterality: N/A;  . NASAL SINUS SURGERY  01/2005  . SQUAMOUS CELL CARCINOMA EXCISION     "mostly arms;" (12/24/2017)  . STRESS CARDIOLITE  11/1999   Normal/ negative  . TEAR DUCT PROBING  2005   "? side"  . TEE WITHOUT CARDIOVERSION N/A 02/16/2013   Procedure: TRANSESOPHAGEAL ECHOCARDIOGRAM (TEE);  Surgeon: Josue Hector, MD;  Location: [facility] ENDOSCOPY;  Service:  Cardiovascular;  Laterality: N/A;  . TUBAL LIGATION     BTL    Prior to Admission medications   Medication Sig Start Date End Date Taking? Authorizing Provider  Betamethasone Sodium Phosphate 6 MG/ML SOLN once a week. Injection in left knee - For 3 weeks only 10/26/19   [provider]  Blood Glucose Monitoring Suppl (ONE TOUCH ULTRA 2) w/Device KIT Use to check blood sugar once daily 10/25/18   Tower, Garber A, MD  diphenhydrAMINE (BENADRYL) 25 MG tablet Take 50 mg by mouth every 8 (eight) hours as needed for allergies.     [provider]  EPINEPHrine (EPI-PEN) 0.3 mg/0.3 mL DEVI Inject 0.3 mg into the muscle daily as needed (allergic reaction).  12/20/10   Tower, [name], MD  erythromycin ophthalmic ointment Place into the left eye as needed. Use on eyelid as needed 07/12/18   [provider]  fluticasone (FLONASE) 50 MCG/ACT nasal spray Place 2 sprays into both nostrils 2 (two) times daily as needed. Reported on 09/10/2015 01/20/14   [provider]  glipiZIDE (GLUCOTROL XL) 10 MG 24 hr tablet Take 1 tablet (10 mg total) by mouth daily with breakfast. 09/21/19  Tower, [name], MD  isosorbide mononitrate (IMDUR) 60 MG 24 hr tablet Take 1 tablet (60 mg total) by mouth daily. 08/03/19   Deboraha Sprang, MD  losartan-hydrochlorothiazide (HYZAAR) 50-12.5 MG tablet Take 0.5 tablets by mouth daily. 08/03/19   Deboraha Sprang, MD  metFORMIN (GLUCOPHAGE-XR) 500 MG 24 hr tablet TAKE 1 TABLET(500 MG) BY MOUTH DAILY WITH BREAKFAST 09/21/19   Tower, [name], MD  neomycin-polymyxin-dexameth (MAXITROL) 0.1 % OINT Place into the left eye as needed.  02/25/19   [provider]  nitroGLYCERIN (NITROSTAT) 0.4 MG SL tablet Place 1 tablet (0.4 mg total) under the tongue every 5 (five) minutes as needed for chest pain (up to 3 doses. If taking 3rd dose call 911). 12/25/17   Dunn, Nedra Hai, PA-C  potassium chloride SA (KLOR-CON) 20 MEQ tablet Take 1 tablet (20 mEq total) by mouth daily.  09/21/19   Tower, [name], MD  ticagrelor (BRILINTA) 90 MG TABS tablet Take 1 tablet (90 mg total) by mouth 2 (two) times daily. 02/13/20   Tower, [name], MD  traMADol (ULTRAM) 50 MG tablet as needed. 12/16/18   [provider]    Allergies Bee venom, Nabumetone, Amoxicillin-pot clavulanate, Aspirin, Atorvastatin, Clopidogrel bisulfate, Codeine, Ezetimibe, Metformin and related, Valsartan, and Other  Family History  Problem Relation Age of Onset  . Lung cancer Brother   . Diabetes Brother   . Pancreatic cancer Brother   . Heart disease Mother   . Heart disease Father   . Brain cancer Other   . Skin cancer Daughter   . Diabetes Sister   . Breast cancer Sister   . Colon cancer Neg Hx     Social History Social History   Tobacco Use  . Smoking status: Never Smoker  . Smokeless tobacco: Never Used  Vaping Use  . Vaping Use: Never used  Substance Use Topics  . Alcohol use: Never    Alcohol/week: 0.0 standard drinks  . Drug use: Never    Review of Systems  Constitutional: No fever/chills Eyes: No visual changes. ENT: No sore throat. Cardiovascular: Positive for chest pain. Respiratory: Denies shortness of breath. Gastrointestinal: No abdominal pain.no vomiting.  No diarrhea.  No constipation.  Positive for nausea. Genitourinary: Negative for dysuria. Musculoskeletal: Negative for back pain. Skin: Negative for rash. Neurological: Negative for headaches, focal weakness or numbness.   ____________________________________________   PHYSICAL EXAM:  VITAL SIGNS: Vitals:   03/21/20 0930 03/21/20 1000  BP: 117/63 (!) 113/58  Pulse:  (!) 55  Resp: 16 14  Temp:    SpO2:  96%      Constitutional: Alert and oriented. Well appearing and in no acute distress. Eyes: Conjunctivae are normal. PERRL. EOMI. Head: Atraumatic. Nose: No congestion/rhinnorhea. Mouth/Throat: Mucous membranes are moist.  Oropharynx non-erythematous. Neck: No stridor. No cervical spine  tenderness to palpation. Cardiovascular: Normal rate, regular rhythm. Grossly normal heart sounds.  Good peripheral circulation. Respiratory: Normal respiratory effort.  No retractions. Lungs CTAB. Gastrointestinal: Soft , nondistended, nontender to palpation. No abdominal bruits. No CVA tenderness. Musculoskeletal: No lower extremity tenderness nor edema.  No joint effusions. No signs of acute trauma. Neurologic:  Normal speech and language. No gross focal neurologic deficits are appreciated. No gait instability noted. Skin:  Skin is warm, dry and intact. No rash noted. Psychiatric: Mood and affect are normal. Speech and behavior are normal.  ____________________________________________   LABS (all labs ordered are listed, but only abnormal results are displayed)  Labs Reviewed  BASIC  METABOLIC PANEL - Abnormal; Notable for the following components:      Result Value   Glucose, Bld 112 (*)    Creatinine, Ser 1.01 (*)    GFR calc non Af Amer 52 (*)    All other components within normal limits  SARS CORONAVIRUS 2 BY RT PCR (HOSPITAL ORDER, [place] [facility])  CBC  MAGNESIUM  TROPONIN I (HIGH SENSITIVITY)  TROPONIN I (HIGH SENSITIVITY)   ____________________________________________  12 Lead EKG Twelve-lead EKG at 712, A. fib with RVR with rates of 123 bpm.  Normal axis and intervals.  No evidence of acute ischemia.  Twelve-lead EKG at 7:30 AM. Sinus rhythm with rate of 68 bpm.  Normal axis and intervals.  No evidence of acute ischemia.  ____________________________________________  RADIOLOGY  ED MD interpretation: 2 view CXR reviewed and with no evidence of acute cardiopulmonary pathology  Official radiology report(s): DG Chest 2 View  Result Date: 03/21/2020 CLINICAL DATA:  Atrial fibrillation EXAM: CHEST - 2 VIEW COMPARISON:  September 10, 2015 FINDINGS: There is mild left base atelectasis. The lungs elsewhere are clear. Heart size and pulmonary vascularity  are normal. No adenopathy. There is aortic atherosclerosis. There is degenerative change in each shoulder. Loop recorder noted on the left anteriorly. IMPRESSION: Left base atelectasis. Lungs elsewhere clear. Heart size normal. Loop recorder on left. Aortic Atherosclerosis (ICD10-I70.0). Electronically Signed   By: Lowella Grip III M.D.   On: 03/21/2020 08:04    ____________________________________________   PROCEDURES and INTERVENTIONS  Procedure(s) performed (including Critical Care):  Procedures  Medications  glipiZIDE (GLUCOTROL XL) 24 hr tablet 10 mg (10 mg Oral Given 03/21/20 0808)  isosorbide mononitrate (IMDUR) 24 hr tablet 60 mg (60 mg Oral Given 03/21/20 0808)  metFORMIN (GLUCOPHAGE-XR) 24 hr tablet 500 mg (500 mg Oral Given 03/21/20 0808)  ticagrelor (BRILINTA) tablet 90 mg (90 mg Oral Given 03/21/20 0807)  losartan (COZAAR) tablet 25 mg (25 mg Oral Given 03/21/20 0807)  hydrochlorothiazide 10 mg/mL oral suspension 6.25 mg (6.25 mg Oral Given 03/21/20 0844)    ____________________________________________   MDM / ED COURSE  84 year old woman presents from home with isolated chest pain, most consistent with paroxysmal A. fib, and ultimately amenable to outpatient management with cardiology follow-up.  Patient presented in A. fib with RVR with rates in the 120s-140s, and hemodynamically stable.  Patient self converted to a sinus rhythm with rates in the 50s-60s prior to my initial evaluation, and she never returned to A. fib during her 3-hour observation period.  Blood work is reassuring without electrolyte derangements and with negative troponin x2.  Follow-up EKG demonstrates a sinus rhythm with slow rates without evidence of acute ischemia.  Patient symptoms resolved after her return to sinus rhythm.  Discussed with patient my recommendations for anticoagulation due to her elevated CHA2DS2-VASc score of 8, but patient and husband declined and wished to go home without  anticoagulation.  She understands the risks of stroke in the setting of paroxysmal A. fib with her multiple comorbid conditions, but wishes to follow-up with her cardiologist and further discuss blood thinners with her husband.  Further discussed the case with cardiology on-call, who agrees with outpatient management and recommendations for anticoagulation.  Patient is good follow-up with cardiology, Dr. Caryl Comes, and I urged her to see him within the next 5 days for follow-up.  We discussed outpatient management and return precautions for the ED.  Patient medically stable for discharge home.  Clinical Course as of Mar 21 1013  Wed Mar 21, 2020  0849 Educated patient and husband on reassuring work-up and her continued to be in a sinus rhythm.  She reports being chest pain-free and feels well at this time.  She is requesting to go home.  I advised her that I would feel more comfortable if she sees a cardiologist within the next week, as opposed to her previously scheduled appointment with with Dr. Cleda Mccreedy 1 month.  She is agreeable to stay for second troponin.  I paged [facility] cardiology, Dr. Garen Lah   [DS]  [phone] Discussed case with Dr. Garen Lah, and he agrees that work-up is benign and if on evidence of paroxysmal A. fib, patient is suitable for discharge and outpatient management with close cardiology follow-up with Dr. Caryl Comes.  He further recommends initiation of anticoagulation with Eliquis in the setting of her high CHA2DS2-VASc score.   [DS]  0908 I discussed this with the patient at the bedside and her husband.  We discussed the risks and benefits of anticoagulation and untreated A. fib.  We discussed stroke risk of Afib, as well as bleeding risk on anticoagulation.  She and her husband decide that she does not wish to start anticoagulation today, they wish to discuss amongst themselves and follow-up with Dr. Caryl Comes as an outpatient to continue this discussion.  She understands the risk of stroke is  present without thinners, and relatively high considering her age and comorbidities.    [DS]    Clinical Course User Index [DS] Vladimir Crofts, MD   ____________________________________________   FINAL CLINICAL IMPRESSION(S) / ED DIAGNOSES  Final diagnoses:  Paroxysmal A-fib ([place])  Other chest pain     ED Discharge Orders    None       Camdyn Laden   Note:  This document was prepared using Dragon voice recognition software and may include unintentional dictation errors.   Vladimir Crofts, MD 03/21/20 1019

## 2020-03-22 ENCOUNTER — Other Ambulatory Visit: Payer: HMO

## 2020-03-22 ENCOUNTER — Telehealth: Payer: Self-pay | Admitting: Internal Medicine

## 2020-03-22 ENCOUNTER — Other Ambulatory Visit (INDEPENDENT_AMBULATORY_CARE_PROVIDER_SITE_OTHER): Payer: HMO

## 2020-03-22 ENCOUNTER — Other Ambulatory Visit: Payer: Self-pay

## 2020-03-22 DIAGNOSIS — E1151 Type 2 diabetes mellitus with diabetic peripheral angiopathy without gangrene: Secondary | ICD-10-CM | POA: Diagnosis not present

## 2020-03-22 DIAGNOSIS — E1169 Type 2 diabetes mellitus with other specified complication: Secondary | ICD-10-CM

## 2020-03-22 DIAGNOSIS — I10 Essential (primary) hypertension: Secondary | ICD-10-CM

## 2020-03-22 DIAGNOSIS — E785 Hyperlipidemia, unspecified: Secondary | ICD-10-CM

## 2020-03-22 LAB — LIPID PANEL
Cholesterol: 190 mg/dL (ref 0–200)
HDL: 41.2 mg/dL (ref >39.00)
LDL Cholesterol: 109 mg/dL — ABNORMAL HIGH (ref 0–99)
NonHDL: 148.53
Total CHOL/HDL Ratio: 5
Triglycerides: 197 mg/dL — ABNORMAL HIGH (ref 0.0–149.0)
VLDL: 39.4 mg/dL (ref 0.0–40.0)

## 2020-03-22 LAB — COMPREHENSIVE METABOLIC PANEL
ALT: 14 U/L (ref 0–35)
AST: 17 U/L (ref 0–37)
Albumin: 4 g/dL (ref 3.5–5.2)
Alkaline Phosphatase: 63 U/L (ref 39–117)
BUN: 15 mg/dL (ref 6–23)
CO2: 30 mEq/L (ref 19–32)
Calcium: 9.5 mg/dL (ref 8.4–10.5)
Chloride: 105 mEq/L (ref 96–112)
Creatinine, Ser: 0.98 mg/dL (ref 0.40–1.20)
GFR: 54.27 mL/min — ABNORMAL LOW (ref >60.00)
Glucose, Bld: 92 mg/dL (ref 70–99)
Potassium: 3.9 mEq/L (ref 3.5–5.1)
Sodium: 140 mEq/L (ref 135–145)
Total Bilirubin: 0.5 mg/dL (ref 0.2–1.2)
Total Protein: 7 g/dL (ref 6.0–8.3)

## 2020-03-22 LAB — HEMOGLOBIN A1C: Hgb A1c MFr Bld: 6.6 % — ABNORMAL HIGH (ref 4.6–6.5)

## 2020-03-22 NOTE — Telephone Encounter (Signed)
Margaret Vazquez is calling stating the hospital advised her to schedule an appointment with Dr. Caryl Comes ASAP due to her having Afib. I advised besides virtual he isn't available until January and offered a PA, but she states she only trust Dr. Caryl Comes to see her. Please advise.

## 2020-03-22 NOTE — Telephone Encounter (Signed)
Spoke with pt who states she was seen in ED at [facility] 03/21/2020 for CP.  Pt reports she was in AFIB and needs f/u within one week.  Pt scheduled to see Tommye Standard, PA-C 03/30/2020 at 930am.  Reviewed ED precautions.Pt verbalizes understanding and agrees with current plan.

## 2020-03-26 ENCOUNTER — Ambulatory Visit (INDEPENDENT_AMBULATORY_CARE_PROVIDER_SITE_OTHER): Payer: HMO | Admitting: Family Medicine

## 2020-03-26 ENCOUNTER — Other Ambulatory Visit: Payer: Self-pay

## 2020-03-26 ENCOUNTER — Encounter: Payer: Self-pay | Admitting: Family Medicine

## 2020-03-26 VITALS — BP 122/60 | HR 62 | Temp 96.9°F | Ht 68.0 in | Wt 187.5 lb

## 2020-03-26 DIAGNOSIS — Z23 Encounter for immunization: Secondary | ICD-10-CM | POA: Diagnosis not present

## 2020-03-26 DIAGNOSIS — Z888 Allergy status to other drugs, medicaments and biological substances status: Secondary | ICD-10-CM | POA: Insufficient documentation

## 2020-03-26 DIAGNOSIS — I10 Essential (primary) hypertension: Secondary | ICD-10-CM | POA: Diagnosis not present

## 2020-03-26 DIAGNOSIS — E1169 Type 2 diabetes mellitus with other specified complication: Secondary | ICD-10-CM

## 2020-03-26 DIAGNOSIS — E1151 Type 2 diabetes mellitus with diabetic peripheral angiopathy without gangrene: Secondary | ICD-10-CM | POA: Diagnosis not present

## 2020-03-26 DIAGNOSIS — E785 Hyperlipidemia, unspecified: Secondary | ICD-10-CM

## 2020-03-26 DIAGNOSIS — I480 Paroxysmal atrial fibrillation: Secondary | ICD-10-CM

## 2020-03-26 NOTE — Assessment & Plan Note (Signed)
Lab Results  Component Value Date   HGBA1C 6.6 (H) 03/22/2020   This is fairly stable disc imp of low glycemic diet and wt loss to prevent DM2  Continues metformin and glipizide  Taking arb  Eye exam utd  Flu shot give today  Allergic to statins

## 2020-03-26 NOTE — Assessment & Plan Note (Signed)
Hives from statin in the past

## 2020-03-26 NOTE — Assessment & Plan Note (Signed)
Disc goals for lipids and reasons to control them Rev last labs with pt Rev low sat fat diet in detail  LDL slt improved Is statin allergic

## 2020-03-26 NOTE — Progress Notes (Signed)
Subjective:    Patient ID: Margaret Vazquez, female    DOB: [birth year], 84 y.o.   MRN: [id number]  This visit occurred during the SARS-CoV-2 public health emergency.  Safety protocols were in place, including screening questions prior to the visit, additional usage of staff PPE, and extensive cleaning of exam room while observing appropriate contact time as indicated for disinfecting solutions.    HPI Pt presents for f/u of chronic medical problems   Wt Readings from Last 3 Encounters:  03/26/20 187 lb 8 oz (85 kg)  03/21/20 185 lb (83.9 kg)  01/16/20 186 lb (84.4 kg)   28.51 kg/m  Was in ER for a fib recently-went in with elevated HR/ BP and chest pain  It corrected itself in ER Feeling fine now  Has appt in cardiology friday   She is covid immunized   HTN bp is stable today  No cp or palpitations or headaches or edema  No side effects to medicines  BP Readings from Last 3 Encounters:  03/26/20 122/60  03/21/20 (!) 113/58  01/16/20 122/70    Losartan hct imdur   Pulse Readings from Last 3 Encounters:  03/26/20 62  03/21/20 (!) 55  01/16/20 62    Has been tired lately  More exercise intolerant   Lab Results  Component Value Date   CREATININE 0.98 03/22/2020   BUN 15 03/22/2020   NA 140 03/22/2020   K 3.9 03/22/2020   CL 105 03/22/2020   CO2 30 03/22/2020   Lab Results  Component Value Date   ALT 14 03/22/2020   AST 17 03/22/2020   ALKPHOS 63 03/22/2020   BILITOT 0.5 03/22/2020     DM2 Lab Results  Component Value Date   HGBA1C 6.6 (H) 03/22/2020  last a1c was 6.4  On metformin and glipizide  Intol of statin  Taking arb Eye exam 8/21 Eating less and cut out snacks   flu shot-today  Hyperlipidemia  Intol to all statins even low dose intermittent (incl joint pain and hives-allergy)  zetia cause fatigue  Lab Results  Component Value Date   CHOL 190 03/22/2020   CHOL 185 09/14/2019   CHOL 168 03/23/2019   Lab Results  Component Value  Date   HDL 41.20 03/22/2020   HDL 47.60 09/14/2019   HDL 42.50 03/23/2019   Lab Results  Component Value Date   LDLCALC 109 (H) 03/22/2020   LDLCALC 114 (H) 09/14/2019   LDLCALC 91 03/23/2019   Lab Results  Component Value Date   TRIG 197.0 (H) 03/22/2020   TRIG 116.0 09/14/2019   TRIG 169.0 (H) 03/23/2019   Lab Results  Component Value Date   CHOLHDL 5 03/22/2020   CHOLHDL 4 09/14/2019   CHOLHDL 4 03/23/2019   Lab Results  Component Value Date   LDLDIRECT 107.0 08/27/2016   LDLDIRECT 136.5 10/20/2011   LDLDIRECT 134.7 08/08/2008   Patient Active Problem List   Diagnosis Date Noted  . Allergy to statin medication 03/26/2020  . History of loop recorder 09/06/2019  . Cryptogenic stroke ([place]) 08/02/2019  . Focal motor seizure ([place]) 03/23/2019  . Breast lump on left side at 10 o'clock position 11/17/2018  . Left-sided epistaxis 11/17/2018  . Medicare annual wellness visit, subsequent 09/13/2018  . Screening mammogram, encounter for 09/13/2018  . Herpes zoster 08/10/2018  . Plantar wart, left foot 06/15/2018  . Atherosclerotic heart disease of native coronary artery with other forms of angina pectoris ([place]) 12/24/2017  . Routine general medical  examination at a health care facility 08/24/2016  . PAF (paroxysmal atrial fibrillation) ([place]) 04/07/2016  . Overweight (BMI 25.0-29.9) 08/23/2014  . Type 2 diabetes mellitus with diabetic peripheral angiopathy without gangrene, without long-term current use of insulin ([place]) 01/12/2014  . TIA (transient ischemic attack) 01/11/2014  . CVA (cerebral infarction) 01/11/2014  . LOOP Recorder LINQ 06/07/2013  . Fatigue 06/07/2013  . Sinusitis, chronic 05/16/2013  . H/O: CVA (cerebrovascular accident) 02/15/2013  . Encounter for Medicare annual wellness exam 10/27/2012  . Encopresis(307.7) 12/15/2011  . Post-menopausal 10/28/2011  . PERSONAL HX COLONIC POLYPS 09/04/2009  . B12 deficiency 03/06/2009  . ALLERGIC RHINITIS 02/14/2008    . BACK PAIN, LUMBAR 11/03/2007  . Hyperlipidemia associated with type 2 diabetes mellitus ([place]) 11/24/2006  . Essential hypertension 11/24/2006  . GERD 10/09/2006  . OVERACTIVE BLADDER 10/09/2006  . INCONTINENCE, URGE 10/09/2006  . SKIN CANCER, HX OF 10/09/2006   Past Medical History:  Diagnosis Date  . Allergy history, drug    Aspirin  . Basal cell carcinoma    "back"  . CAD (coronary artery disease)    a. Previously nonobstructive then progressive angina with abnl CT -> cardiac cath 12/24/17 showed 30% mid RCA and 80% prox-mid Cx. She received DES to mid AV groove Cx. EF 55-65%.   . Cervical stenosis of spine    With neck pain  . CKD (chronic kidney disease), stage II   . Colon polyps 2009  . Complication of anesthesia 1980s   slow to wake after anesthesia "when I had breast biopsy"  . GERD (gastroesophageal reflux disease)   . Hyperlipidemia    myalgias with Lipitor and Zetia  . Hypertension   . Migraine    "stopped at age 42" (12/24/2017)  . Squamous carcinoma    "iced off and cut off; mostly arms" (12/24/2017)  . Stroke [facility])    "told me I'd had 2 strokes in 02/2013"; denies residual on 12/24/2017  . TIA (transient ischemic attack)   . Type II diabetes mellitus ([place])    Past Surgical History:  Procedure Laterality Date  . ABD [place]  07/2003   Negative  . APPENDECTOMY    . BASAL CELL CARCINOMA EXCISION     "back"  . BREAST BIOPSY Left 1992   benign  . CARDIAC CATHETERIZATION  04/07/2011   non obst CAD (Dr Burt Knack)  . CATARACT EXTRACTION W/PHACO Left 04/01/2017   Procedure: CATARACT EXTRACTION PHACO AND INTRAOCULAR LENS PLACEMENT ([place]) LEFT DIABETIC;  Surgeon: Leandrew Koyanagi, MD;  Location: [facility];  Service: Ophthalmology;  Laterality: Left;  Diabetic - oral meds  . CATARACT EXTRACTION W/PHACO Right 04/29/2017   Procedure: CATARACT EXTRACTION PHACO AND INTRAOCULAR LENS PLACEMENT ([place]) RIGHT DIABETIC;  Surgeon: Leandrew Koyanagi, MD;  Location: [facility];  Service: Ophthalmology;  Laterality: Right;  Diabetic - oral meds  . COLONOSCOPY  12/2007   Adenomatous colon polyps  . CORONARY ANGIOPLASTY WITH STENT PLACEMENT  12/24/2017  . CORONARY STENT INTERVENTION N/A 12/24/2017   Procedure: CORONARY STENT INTERVENTION;  Surgeon: Lorretta Harp, MD;  Location: [facility] CV LAB;  Service: Cardiovascular;  Laterality: N/A;  . CYSTOSCOPY W/ DECANNULATION  03/2000   Normal  . LEFT HEART CATH AND CORONARY ANGIOGRAPHY N/A 12/24/2017   Procedure: LEFT HEART CATH AND CORONARY ANGIOGRAPHY;  Surgeon: Lorretta Harp, MD;  Location: [facility] CV LAB;  Service: Cardiovascular;  Laterality: N/A;  . LOOP RECORDER IMPLANT N/A 02/16/2013   Procedure: LOOP RECORDER IMPLANT;  Surgeon: Remo Lipps  Peterson Lombard, MD;  Location: [facility] CATH LAB;  Service: Cardiovascular;  Laterality: N/A;  . NASAL SINUS SURGERY  01/2005  . SQUAMOUS CELL CARCINOMA EXCISION     "mostly arms;" (12/24/2017)  . STRESS CARDIOLITE  11/1999   Normal/ negative  . TEAR DUCT PROBING  2005   "? side"  . TEE WITHOUT CARDIOVERSION N/A 02/16/2013   Procedure: TRANSESOPHAGEAL ECHOCARDIOGRAM (TEE);  Surgeon: Josue Hector, MD;  Location: [facility] ENDOSCOPY;  Service: Cardiovascular;  Laterality: N/A;  . TUBAL LIGATION     BTL   Social History   Tobacco Use  . Smoking status: Never Smoker  . Smokeless tobacco: Never Used  Vaping Use  . Vaping Use: Never used  Substance Use Topics  . Alcohol use: Never    Alcohol/week: 0.0 standard drinks  . Drug use: Never   Family History  Problem Relation Age of Onset  . Lung cancer Brother   . Diabetes Brother   . Pancreatic cancer Brother   . Heart disease Mother   . Heart disease Father   . Brain cancer Other   . Skin cancer Daughter   . Diabetes Sister   . Breast cancer Sister   . Colon cancer Neg Hx    Allergies  Allergen Reactions  . Bee Venom Hives, Shortness Of Breath and Swelling  . Nabumetone Anaphylaxis  . Amoxicillin-Pot  Clavulanate Hives and Swelling    To lips.  . Aspirin Hives  . Atorvastatin Swelling     joint pain/swelling, inc liver tests  . Clopidogrel Bisulfate Hives  . Codeine Nausea And Vomiting  . Ezetimibe Other (See Comments)     fatigue  . Metformin And Related Other (See Comments)    Diarrhea   . Valsartan Other (See Comments)     fatigue  . Other Hives    **Red Meat**  SOB   Current Outpatient Medications on File Prior to Visit  Medication Sig Dispense Refill  . Betamethasone Sodium Phosphate 6 MG/ML SOLN once a week. Injection in left knee - For 3 weeks only    . Blood Glucose Monitoring Suppl (ONE TOUCH ULTRA 2) w/Device KIT Use to check blood sugar once daily 1 each 0  . diphenhydrAMINE (BENADRYL) 25 MG tablet Take 50 mg by mouth every 8 (eight) hours as needed for allergies.     Marland Kitchen EPINEPHrine (EPI-PEN) 0.3 mg/0.3 mL DEVI Inject 0.3 mg into the muscle daily as needed (allergic reaction).     Marland Kitchen erythromycin ophthalmic ointment Place into the left eye as needed. Use on eyelid as needed    . fluticasone (FLONASE) 50 MCG/ACT nasal spray Place 2 sprays into both nostrils 2 (two) times daily as needed. Reported on 09/10/2015    . glipiZIDE (GLUCOTROL XL) 10 MG 24 hr tablet Take 1 tablet (10 mg total) by mouth daily with breakfast. 90 tablet 3  . isosorbide mononitrate (IMDUR) 60 MG 24 hr tablet Take 1 tablet (60 mg total) by mouth daily. 90 tablet 3  . losartan-hydrochlorothiazide (HYZAAR) 50-12.5 MG tablet Take 0.5 tablets by mouth daily. 45 tablet 3  . metFORMIN (GLUCOPHAGE-XR) 500 MG 24 hr tablet TAKE 1 TABLET(500 MG) BY MOUTH DAILY WITH BREAKFAST 90 tablet 3  . neomycin-polymyxin-dexameth (MAXITROL) 0.1 % OINT Place into the left eye as needed.     . nitroGLYCERIN (NITROSTAT) 0.4 MG SL tablet Place 1 tablet (0.4 mg total) under the tongue every 5 (five) minutes as needed for chest pain (up to 3 doses. If taking 3rd  dose call 911). 25 tablet 3  . potassium chloride SA (KLOR-CON) 20 MEQ  tablet Take 1 tablet (20 mEq total) by mouth daily. 90 tablet 3  . ticagrelor (BRILINTA) 90 MG TABS tablet Take 1 tablet (90 mg total) by mouth 2 (two) times daily. 14 tablet 0  . traMADol (ULTRAM) 50 MG tablet as needed.     No current facility-administered medications on file prior to visit.     Review of Systems  Constitutional: Negative for activity change, appetite change, fatigue, fever and unexpected weight change.  HENT: Negative for congestion, ear pain, rhinorrhea, sinus pressure and sore throat.   Eyes: Negative for pain, redness and visual disturbance.  Respiratory: Negative for cough, shortness of breath and wheezing.   Cardiovascular: Negative for chest pain, palpitations and leg swelling.  Gastrointestinal: Negative for abdominal pain, blood in stool, constipation and diarrhea.  Endocrine: Negative for polydipsia and polyuria.  Genitourinary: Negative for dysuria, frequency and urgency.  Musculoskeletal: Negative for arthralgias, back pain and myalgias.  Skin: Negative for pallor and rash.  Allergic/Immunologic: Negative for environmental allergies.  Neurological: Negative for dizziness, syncope and headaches.  Hematological: Negative for adenopathy. Does not bruise/bleed easily.  Psychiatric/Behavioral: Negative for decreased concentration and dysphoric mood. The patient is not nervous/anxious.        Objective:   Physical Exam Constitutional:      General: She is not in acute distress.    Appearance: Normal appearance. She is well-developed and normal weight. She is not ill-appearing or diaphoretic.  HENT:     Head: Normocephalic and atraumatic.  Eyes:     Conjunctiva/sclera: Conjunctivae normal.     Pupils: Pupils are equal, round, and reactive to light.  Neck:     Thyroid: No thyromegaly.     Vascular: No carotid bruit or JVD.  Cardiovascular:     Rate and Rhythm: Normal rate and regular rhythm.     Heart sounds: Normal heart sounds. No gallop.   Pulmonary:      Effort: Pulmonary effort is normal. No respiratory distress.     Breath sounds: Normal breath sounds. No wheezing or rales.  Abdominal:     General: Bowel sounds are normal. There is no distension or abdominal bruit.     Palpations: Abdomen is soft. There is no mass.     Tenderness: There is no abdominal tenderness.  Musculoskeletal:     Cervical back: Normal range of motion and neck supple.     Right lower leg: No edema.     Left lower leg: No edema.  Lymphadenopathy:     Cervical: No cervical adenopathy.  Skin:    General: Skin is warm and dry.     Coloration: Skin is not pale.     Findings: No erythema or rash.  Neurological:     Mental Status: She is alert.     Sensory: No sensory deficit.     Motor: No weakness.     Coordination: Coordination normal.     Deep Tendon Reflexes: Reflexes are normal and symmetric. Reflexes normal.  Psychiatric:        Mood and Affect: Mood normal.           Assessment & Plan:   Problem List Items Addressed This Visit      Cardiovascular and Mediastinum   Essential hypertension    bp in fair control at this time  BP Readings from Last 1 Encounters:  03/26/20 122/60   No changes needed Most recent labs  reviewed  Disc lifstyle change with low sodium diet and exercise        Type 2 diabetes mellitus with diabetic peripheral angiopathy without gangrene, without long-term current use of insulin (HCC) - Primary    Lab Results  Component Value Date   HGBA1C 6.6 (H) 03/22/2020   This is fairly stable disc imp of low glycemic diet and wt loss to prevent DM2  Continues metformin and glipizide  Taking arb  Eye exam utd  Flu shot give today  Allergic to statins        PAF (paroxysmal atrial fibrillation) ([place])    Recent ER visit (also had cp) Converted back while there Doing better now  Taking brilinta  For cardiac f/u soon          Endocrine   Hyperlipidemia associated with type 2 diabetes mellitus ([place])    Disc  goals for lipids and reasons to control them Rev last labs with pt Rev low sat fat diet in detail  LDL slt improved Is statin allergic          Other   Allergy to statin medication    Hives from statin in the past        Other Visit Diagnoses    Need for influenza vaccination       Relevant Orders   Flu Vaccine QUAD High Dose(Fluad) (Completed)

## 2020-03-26 NOTE — Patient Instructions (Signed)
Flu shot today   Labs are fairly stable Try to get most of your carbohydrates from produce (with the exception of white potatoes)  Eat less bread/pasta/rice/snack foods/cereals/sweets and other items from the middle of the grocery store (processed carbs) Avoid red meat/ fried foods/ egg yolks/ fatty breakfast meats/ butter, cheese and high fat dairy/ and shellfish     Follow up with cardiology as planned

## 2020-03-26 NOTE — Assessment & Plan Note (Signed)
Recent ER visit (also had cp) Converted back while there Doing better now  Taking brilinta  For cardiac f/u soon

## 2020-03-26 NOTE — Assessment & Plan Note (Signed)
bp in fair control at this time  BP Readings from Last 1 Encounters:  03/26/20 122/60   No changes needed Most recent labs reviewed  Disc lifstyle change with low sodium diet and exercise

## 2020-03-29 ENCOUNTER — Telehealth: Payer: Self-pay | Admitting: Family Medicine

## 2020-03-29 MED ORDER — ONETOUCH ULTRA VI STRP: ORAL_STRIP | 1 refills | Status: DC

## 2020-03-29 NOTE — Progress Notes (Signed)
Cardiology Office Note Date:  03/30/2020  Patient ID:  Margaret Vazquez, Margaret Vazquez Jul 17, 1935, MRN [id number] PCP:  Abner Greenspan, MD  Cardiologist/Electrophysiologist: Dr. Caryl Comes    Chief Complaint:  post hospital, new AFib  History of Present Illness: Margaret Vazquez is a 84 y.o. female with history of HTN, HLD (intolerant of lipitor, statins, and zetia), CAD (catheterization on 12/24/2017 which showed a 30% mid LAD lesion, 80% proximal to mid left circumflex lesion treated with DES), stroke > loop,  DM  She was found to have only one event of atrial fibrillation that prompted a visit to the atrial fibrillation clinic.This event occurred in the context of treatment anaphylaxis w epinephrine.. Discussion with Dr. Leonie Man suggested no indication for anticoagulation    She is allergic to ASA and plavix  She comes in today to be seen for Dr. Caryl Comes, last seen by him 09/08/19 via tele health visit.  She denied symptoms, her ILR known to be EOS. Mentioned her angina much improved on Imdur and BP well controlled.  Thoughts about PCSK9 therapy, planned to see APP in 76mo  She saw A. Tillery, PA, she continued to feel well, noted that she was in the OMcIntyrestudy (Inclisiran) and exited due to hip pain. He was not sure she would qualify for any other PCSK9 [facility] and was referred to the lipid clinic.  [facility] Initial EKG reported AFib w/RVR and f/u back in SR. Narrative mentions while there she c/o mid (2/10) chest pain and in/out of AF and remained resolved with SR She was [facility] husband [facility], wanting to discuss with Dr. KCaryl ComesLABS K+ 3.9 BUN/Creat 19/1.01 > 15/0.98 Mag 2.2 WBC 7.2 H/H 15/44 Plts 219 HS Trop 7 > 8   TODAY She is feeling quite well, has not had any further palpitations She reports that that day she was woken with "an odd sensation" inher chest, not CP, certainly something  felt off, maybe palpitations. She got up, walked a bit, sat in the kitchen and did not go away, checked her BP was 180-200SBP and HR showed 140's, she assumed was a mistake, took them again and got about the same.  She woke her husband took a NTG without improvement and went to the hospital No overt SOB, no near syncope or syncope Made her feel weak and tired though.    Device information MDT ILR, implanted 02/16/2013 for cryptogenic stroke, EOS 2018   Past Medical History:  Diagnosis Date  . Allergy history, drug    Aspirin  . Basal cell carcinoma    "back"  . CAD (coronary artery disease)    a. Previously nonobstructive then progressive angina with abnl CT -> cardiac cath 12/24/17 showed 30% mid RCA and 80% prox-mid Cx. She received DES to mid AV groove Cx. EF 55-65%.   . Cervical stenosis of spine    With neck pain  . CKD (chronic kidney disease), stage II   . Colon polyps 2009  . Complication of anesthesia 1980s   slow to wake after anesthesia "when I had breast biopsy"  . GERD (gastroesophageal reflux disease)   . Hyperlipidemia    myalgias with Lipitor and Zetia  . Hypertension   . Migraine    "stopped at age 84 (12/24/2017)  . Squamous carcinoma    "iced off and cut off; mostly arms" (12/24/2017)  .  Stroke [facility])    "told me I'd had 2 strokes in 02/2013"; denies residual on 12/24/2017  . TIA (transient ischemic attack)   . Type II diabetes mellitus ([place])     Past Surgical History:  Procedure Laterality Date  . ABD [place]  07/2003   Negative  . APPENDECTOMY    . BASAL CELL CARCINOMA EXCISION     "back"  . BREAST BIOPSY Left 1992   benign  . CARDIAC CATHETERIZATION  04/07/2011   non obst CAD (Dr Burt Knack)  . CATARACT EXTRACTION W/PHACO Left 04/01/2017   Procedure: CATARACT EXTRACTION PHACO AND INTRAOCULAR LENS PLACEMENT ([place]) LEFT DIABETIC;  Surgeon: Leandrew Koyanagi, MD;  Location: [place];  Service: Ophthalmology;  Laterality: Left;  Diabetic - oral meds    . CATARACT EXTRACTION W/PHACO Right 04/29/2017   Procedure: CATARACT EXTRACTION PHACO AND INTRAOCULAR LENS PLACEMENT ([place]) RIGHT DIABETIC;  Surgeon: Leandrew Koyanagi, MD;  Location: [place];  Service: Ophthalmology;  Laterality: Right;  Diabetic - oral meds  . COLONOSCOPY  12/2007   Adenomatous colon polyps  . CORONARY ANGIOPLASTY WITH STENT PLACEMENT  12/24/2017  . CORONARY STENT INTERVENTION N/A 12/24/2017   Procedure: CORONARY STENT INTERVENTION;  Surgeon: Lorretta Harp, MD;  Location: [place] CV LAB;  Service: Cardiovascular;  Laterality: N/A;  . CYSTOSCOPY W/ DECANNULATION  03/2000   Normal  . LEFT HEART CATH AND CORONARY ANGIOGRAPHY N/A 12/24/2017   Procedure: LEFT HEART CATH AND CORONARY ANGIOGRAPHY;  Surgeon: Lorretta Harp, MD;  Location: [place] CV LAB;  Service: Cardiovascular;  Laterality: N/A;  . LOOP RECORDER IMPLANT N/A 02/16/2013   Procedure: LOOP RECORDER IMPLANT;  Surgeon: Deboraha Sprang, MD;  Location: [facility] CATH LAB;  Service: Cardiovascular;  Laterality: N/A;  . NASAL SINUS SURGERY  01/2005  . SQUAMOUS CELL CARCINOMA EXCISION     "mostly arms;" (12/24/2017)  . STRESS CARDIOLITE  11/1999   Normal/ negative  . TEAR DUCT PROBING  2005   "? side"  . TEE WITHOUT CARDIOVERSION N/A 02/16/2013   Procedure: TRANSESOPHAGEAL ECHOCARDIOGRAM (TEE);  Surgeon: Josue Hector, MD;  Location: [facility] ENDOSCOPY;  Service: Cardiovascular;  Laterality: N/A;  . TUBAL LIGATION     BTL    Current Outpatient Medications  Medication Sig Dispense Refill  . Betamethasone Sodium Phosphate 6 MG/ML SOLN once a week. Injection in left knee - For 3 weeks only    . Blood Glucose Monitoring Suppl (ONE TOUCH ULTRA 2) w/Device KIT Use to check blood sugar once daily 1 each 0  . diphenhydrAMINE (BENADRYL) 25 MG tablet Take 50 mg by mouth every 8 (eight) hours as needed for allergies.     Marland Kitchen EPINEPHrine (EPI-PEN) 0.3 mg/0.3 mL DEVI Inject 0.3 mg into the muscle daily as needed  (allergic reaction).     Marland Kitchen erythromycin ophthalmic ointment Place into the left eye as needed. Use on eyelid as needed    . fluticasone (FLONASE) 50 MCG/ACT nasal spray Place 2 sprays into both nostrils 2 (two) times daily as needed. Reported on 09/10/2015    . glipiZIDE (GLUCOTROL XL) 10 MG 24 hr tablet Take 1 tablet (10 mg total) by mouth daily with breakfast. 90 tablet 3  . glucose blood (ONETOUCH ULTRA) test strip USE TO CHECK BLOOD SUGAR ONCE DAILY (DX CODE: E11.51) 100 each 1  . isosorbide mononitrate (IMDUR) 60 MG 24 hr tablet Take 1 tablet (60 mg total) by mouth daily. 90 tablet 3  . losartan-hydrochlorothiazide (HYZAAR) 50-12.5 MG tablet Take 0.5  tablets by mouth daily. 45 tablet 3  . metFORMIN (GLUCOPHAGE-XR) 500 MG 24 hr tablet TAKE 1 TABLET(500 MG) BY MOUTH DAILY WITH BREAKFAST 90 tablet 3  . neomycin-polymyxin-dexameth (MAXITROL) 0.1 % OINT Place into the left eye as needed.     . nitroGLYCERIN (NITROSTAT) 0.4 MG SL tablet Place 1 tablet (0.4 mg total) under the tongue every 5 (five) minutes as needed for chest pain (up to 3 doses. If taking 3rd dose call 911). 25 tablet 3  . potassium chloride SA (KLOR-CON) 20 MEQ tablet Take 1 tablet (20 mEq total) by mouth daily. 90 tablet 3  . ticagrelor (BRILINTA) 90 MG TABS tablet Take 1 tablet (90 mg total) by mouth 2 (two) times daily. 14 tablet 0  . traMADol (ULTRAM) 50 MG tablet as needed.     No current facility-administered medications for this visit.    Allergies:   Bee venom, Nabumetone, Amoxicillin-pot clavulanate, Aspirin, Atorvastatin, Clopidogrel bisulfate, Codeine, Ezetimibe, Metformin and related, Valsartan, and Other   Social History:  The patient  reports that she has never smoked. She has never used smokeless tobacco. She reports that she does not drink alcohol and does not use drugs.   Family History:  The patient's family history includes Brain cancer in an other family member; Breast cancer in her sister; Diabetes in her  brother and sister; Heart disease in her father and mother; Lung cancer in her brother; Pancreatic cancer in her brother; Skin cancer in her daughter.  ROS:  Please see the history of present illness.    All other systems are reviewed and otherwise negative.   PHYSICAL EXAM:  VS:  BP 126/68   Pulse 68   Ht _0  (1.727 m)   Wt 189 lb (85.7 kg)   BMI 28.74 kg/m  BMI: Body mass index is 28.74 kg/m. Well nourished, well developed, in no acute distress HEENT: normocephalic, atraumatic Neck: no JVD, carotid bruits or masses Cardiac:  RRR; no significant murmurs, no rubs, or gallops Lungs:  CTA b/l, no wheezing, rhonchi or rales Abd: soft, nontender MS: no deformity or atrophy Ext: no edema Skin: warm and dry, no rash Neuro:  No gross deficits appreciated Psych: euthymic mood, full affect   EKG:  ER EKGs are reviewed AFib 123bpm > SR68bpm, no acute/ischemic looking changes    12/24/2017; LHC/PCI  Mid RCA lesion is 30% stenosed.  Prox Cx to Mid Cx lesion is 80% stenosed.  A stent was successfully placed.  Post intervention, there is a 0% residual stenosis.  The left ventricular systolic function is normal.  LV end diastolic pressure is normal.  The left ventricular ejection fraction is 55-65% by visual estimate.    01/11/2014: TTE  Study Conclusions  - Left ventricle: The cavity size was normal. Wall thickness was  normal. Systolic function was normal. The estimated ejection  fraction was in the range of 60% to 65%. Wall motion was normal;  there were no regional wall motion abnormalities. Doppler  parameters are consistent with abnormal left ventricular  relaxation (grade 1 diastolic dysfunction). The E/e&' ratio is  between 8-15, suggesting indeterminate LV Filling pressure.  - Aortic valve: Trileaflet. Sclerosis without stenosis. There was  no regurgitation.  - Left atrium: Moderately dilated (40 ml/m2).   Impressions:  - No significant change  compared to echo in 04/2013.    Recent Labs: 09/14/2019: TSH 4.10 03/21/2020: Hemoglobin 15.0; Magnesium 2.2; Platelets 219 03/22/2020: ALT 14; BUN 15; Creatinine, Ser 0.98; Potassium 3.9; Sodium 140  03/22/2020: Cholesterol 190; HDL 41.20; LDL Cholesterol 109; Total CHOL/HDL Ratio 5; Triglycerides 197.0; VLDL 39.4   Estimated Creatinine Clearance: 50.7 mL/min (by C-G formula based on SCr of 0.98 mg/dL).   Wt Readings from Last 3 Encounters:  03/30/20 189 lb (85.7 kg)  03/26/20 187 lb 8 oz (85 kg)  03/21/20 185 lb (83.9 kg)     Other studies reviewed: Additional studies/records reviewed today include: summarized above  ASSESSMENT AND PLAN:  1. Paroxysmal Afib     CHA2DS2Vasc is 54 (female, age, CAD, HTN, DM) (her h/o stroke is very remote (2014 at time of her implant, none further in d/w the pt, review of her chart)      I discussed AFib with the patient, rational for anticoagulation beyond her Brilinta. I reviewed the case with Dr. Caryl Comes. Did not think we could connect her stroke many years ago to her AFib today. She was symptomatic and aware of her AFib, duration reported 2-3 hours self terminated. His recommendation today is for now, no [place], continue her Brilinta, monitor for recurrent symptoms Update her echo.  I discussed with her if she has recurrent symptoms, thinks she is in AFib and not stopping or symptomatic, to seek attention and make [place] aware We will see her back in 6-8 weeks, she prefers to see Dr. Caryl Comes.  2. CAD     No anginal symptoms      On brilinta, intolerant of statin  3. HTN     Looks very good  4. HLD     statin intolerant     Not addressed today   Disposition: as above  Current medicines are reviewed at length with the patient today.  The patient did not have any concerns regarding medicines.  Venetia Night, PA-C 03/30/2020 1:03 PM     [address] [address] [phone] (office)    [phone] (fax)

## 2020-03-29 NOTE — Telephone Encounter (Signed)
Pt said she need a new prescription for one touch test strips sent to [facility] on [address].

## 2020-03-29 NOTE — Telephone Encounter (Signed)
Rx sent 

## 2020-03-30 ENCOUNTER — Ambulatory Visit: Payer: HMO | Admitting: Physician Assistant

## 2020-03-30 ENCOUNTER — Other Ambulatory Visit: Payer: Self-pay

## 2020-03-30 VITALS — BP 126/68 | HR 68 | Ht 68.0 in | Wt 189.0 lb

## 2020-03-30 DIAGNOSIS — I480 Paroxysmal atrial fibrillation: Secondary | ICD-10-CM | POA: Diagnosis not present

## 2020-03-30 DIAGNOSIS — I10 Essential (primary) hypertension: Secondary | ICD-10-CM

## 2020-03-30 DIAGNOSIS — I2510 Atherosclerotic heart disease of native coronary artery without angina pectoris: Secondary | ICD-10-CM | POA: Diagnosis not present

## 2020-03-30 NOTE — Patient Instructions (Signed)
Medication Instructions:   Your physician recommends that you continue on your current medications as directed. Please refer to the Current Medication list given to you today. *If you need a refill on your cardiac medications before your next appointment, please call your pharmacy*   Lab Work:  [place]   If you have labs (blood work) drawn today and your tests are completely normal, you will receive your results only by: Marland Kitchen MyChart Message (if you have MyChart) OR . A paper copy in the mail If you have any lab test that is abnormal or we need to change your treatment, we will call you to review the results.   Testing/Procedures: Your physician has requested that you have an echocardiogram. Echocardiography is a painless test that uses sound waves to create images of your heart. It provides your doctor with information about the size and shape of your heart and how well your heart's chambers and valves are working. This procedure takes approximately one hour. There are no restrictions for this procedure.    Follow-Up: At [facility], you and your health needs are our priority.  As part of our continuing mission to provide you with exceptional heart care, we have created designated Provider Care Teams.  These Care Teams include your primary Cardiologist (physician) and Advanced Practice Providers (APPs -  Physician Assistants and Nurse Practitioners) who all work together to provide you with the care you need, when you need it.  We recommend signing up for the patient portal called "MyChart".  Sign up information is provided on this After Visit Summary.  MyChart is used to connect with patients for Virtual Visits (Telemedicine).  Patients are able to view lab/test results, encounter notes, upcoming appointments, etc.  Non-urgent messages can be sent to your provider as well.   To learn more about what you can do with MyChart, go to [email or web address].    Your next  appointment:   6 week(s)  The format for your next appointment:   In Person  Provider:   You may see Virl Axe, MD Only    Other Instructions

## 2020-04-18 ENCOUNTER — Other Ambulatory Visit: Payer: Self-pay | Admitting: Internal Medicine

## 2020-04-19 MED ORDER — TICAGRELOR 90 MG PO TABS
90.0000 mg | ORAL_TABLET | Freq: Two times a day (BID) | ORAL | 3 refills | Status: DC
Start: 2020-04-19 — End: 2020-05-03

## 2020-04-23 ENCOUNTER — Other Ambulatory Visit: Payer: Self-pay

## 2020-04-23 ENCOUNTER — Ambulatory Visit (HOSPITAL_COMMUNITY): Payer: HMO | Attending: Cardiology

## 2020-04-23 DIAGNOSIS — I480 Paroxysmal atrial fibrillation: Secondary | ICD-10-CM

## 2020-04-23 LAB — ECHOCARDIOGRAM COMPLETE
Area-P 1/2: 1.79 cm2
S' Lateral: 2.6 cm

## 2020-05-02 NOTE — Progress Notes (Signed)
Patient Care Team: Tower, [name], MD as PCP - General (Family Medicine) Deboraha Sprang, MD as PCP - Cardiology (Cardiology) Deboraha Sprang, MD as PCP - Electrophysiology (Cardiology) Dasher, Rayvon Char, MD as Consulting Physician (Dermatology) Salomon Fick., MD as Referring Physician (Dentistry) Leandrew Koyanagi, MD as Referring Physician (Ophthalmology) Dimitri Ped, RN as [place] Management   HPI  Margaret Vazquez is a 84 y.o. female Seen in followup for a loop recorder implanted for cryptogenic stroke. She has significant hypertension which is much improved  She was found to have atrial fibrillation that prompted a visit to the atrial fibrillation clinic.This event occurred in the context of treatment anaphylaxis w epinephrine.. Discussion with Dr. Leonie Man suggested no indication for anticoagulation    Subsequently seen in the emergency room 03/21/2020 with atrial fibrillation with a rapid rate.  Associated with chest pain.  2019 coronary artery disease-CT>> abnormal FFR; circumflex was stented     Currently taking Brilinta.  Is allergic to aspirin and Plavix.  Continues with nuisance bleeding    Significant exercise intolerance, having to rest after vacuuming one room -- climbing stairs are exhausting   No chest pain, but some sob  Ton of stress and obligations, husband with cholecystitis and sister with cancer--  She has Carotid disease but has been intolerant of statins and has been referred for PCSK9 therapy  DATE TEST EF   6/19 LHC 55-65% CXp/m-80>>stent RCAm 30%  10/21 Echo   65-70 %         Date Cr K Hgb  9/21 0.98 3.9 15.0         Thromboembolic risk factors ( age  -2, HTN-1, TIA/CVA-2, Vasc disease -1, Gender-1) for a CHADSVASc Score of >=7      Past Medical History:  Diagnosis Date  . Allergy history, drug    Aspirin  . Basal cell carcinoma    "back"  . CAD (coronary artery disease)    a. Previously  nonobstructive then progressive angina with abnl CT -> cardiac cath 12/24/17 showed 30% mid RCA and 80% prox-mid Cx. She received DES to mid AV groove Cx. EF 55-65%.   . Cervical stenosis of spine    With neck pain  . CKD (chronic kidney disease), stage II   . Colon polyps 2009  . Complication of anesthesia 1980s   slow to wake after anesthesia "when I had breast biopsy"  . GERD (gastroesophageal reflux disease)   . Hyperlipidemia    myalgias with Lipitor and Zetia  . Hypertension   . Migraine    "stopped at age 62" (12/24/2017)  . Squamous carcinoma    "iced off and cut off; mostly arms" (12/24/2017)  . Stroke [facility])    "told me I'd had 2 strokes in 02/2013"; denies residual on 12/24/2017  . TIA (transient ischemic attack)   . Type II diabetes mellitus ([place])     Past Surgical History:  Procedure Laterality Date  . ABD [place]  07/2003   Negative  . APPENDECTOMY    . BASAL CELL CARCINOMA EXCISION     "back"  . BREAST BIOPSY Left 1992   benign  . CARDIAC CATHETERIZATION  04/07/2011   non obst CAD (Dr Burt Knack)  . CATARACT EXTRACTION W/PHACO Left 04/01/2017   Procedure: CATARACT EXTRACTION PHACO AND INTRAOCULAR LENS PLACEMENT ([place]) LEFT DIABETIC;  Surgeon: Leandrew Koyanagi, MD;  Location: [place];  Service: Ophthalmology;  Laterality: Left;  Diabetic -  oral meds  . CATARACT EXTRACTION W/PHACO Right 04/29/2017   Procedure: CATARACT EXTRACTION PHACO AND INTRAOCULAR LENS PLACEMENT ([place]) RIGHT DIABETIC;  Surgeon: Leandrew Koyanagi, MD;  Location: [place];  Service: Ophthalmology;  Laterality: Right;  Diabetic - oral meds  . COLONOSCOPY  12/2007   Adenomatous colon polyps  . CORONARY ANGIOPLASTY WITH STENT PLACEMENT  12/24/2017  . CORONARY STENT INTERVENTION N/A 12/24/2017   Procedure: CORONARY STENT INTERVENTION;  Surgeon: Lorretta Harp, MD;  Location: [place] CV LAB;  Service: Cardiovascular;  Laterality: N/A;  . CYSTOSCOPY W/ DECANNULATION  03/2000    Normal  . LEFT HEART CATH AND CORONARY ANGIOGRAPHY N/A 12/24/2017   Procedure: LEFT HEART CATH AND CORONARY ANGIOGRAPHY;  Surgeon: Lorretta Harp, MD;  Location: [place] CV LAB;  Service: Cardiovascular;  Laterality: N/A;  . LOOP RECORDER IMPLANT N/A 02/16/2013   Procedure: LOOP RECORDER IMPLANT;  Surgeon: Deboraha Sprang, MD;  Location: [facility] CATH LAB;  Service: Cardiovascular;  Laterality: N/A;  . NASAL SINUS SURGERY  01/2005  . SQUAMOUS CELL CARCINOMA EXCISION     "mostly arms;" (12/24/2017)  . STRESS CARDIOLITE  11/1999   Normal/ negative  . TEAR DUCT PROBING  2005   "? side"  . TEE WITHOUT CARDIOVERSION N/A 02/16/2013   Procedure: TRANSESOPHAGEAL ECHOCARDIOGRAM (TEE);  Surgeon: Josue Hector, MD;  Location: [facility] ENDOSCOPY;  Service: Cardiovascular;  Laterality: N/A;  . TUBAL LIGATION     BTL    Current Outpatient Medications  Medication Sig Dispense Refill  . Blood Glucose Monitoring Suppl (ONE TOUCH ULTRA 2) w/Device KIT Use to check blood sugar once daily 1 each 0  . diphenhydrAMINE (BENADRYL) 25 MG tablet Take 50 mg by mouth every 8 (eight) hours as needed for allergies.     Marland Kitchen EPINEPHrine (EPI-PEN) 0.3 mg/0.3 mL DEVI Inject 0.3 mg into the muscle daily as needed (allergic reaction).     Marland Kitchen erythromycin ophthalmic ointment Place into the left eye as needed. Use on eyelid as needed    . fluticasone (FLONASE) 50 MCG/ACT nasal spray Place 2 sprays into both nostrils 2 (two) times daily as needed. Reported on 09/10/2015    . glipiZIDE (GLUCOTROL XL) 10 MG 24 hr tablet Take 1 tablet (10 mg total) by mouth daily with breakfast. 90 tablet 3  . glucose blood (ONETOUCH ULTRA) test strip USE TO CHECK BLOOD SUGAR ONCE DAILY (DX CODE: E11.51) 100 each 1  . isosorbide mononitrate (IMDUR) 60 MG 24 hr tablet Take 1 tablet (60 mg total) by mouth daily. 90 tablet 3  . losartan-hydrochlorothiazide (HYZAAR) 50-12.5 MG tablet Take 0.5 tablets by mouth daily. 45 tablet 3  . metFORMIN (GLUCOPHAGE-XR) 500 MG  24 hr tablet TAKE 1 TABLET(500 MG) BY MOUTH DAILY WITH BREAKFAST 90 tablet 3  . neomycin-polymyxin-dexameth (MAXITROL) 0.1 % OINT Place into the left eye as needed.     . nitroGLYCERIN (NITROSTAT) 0.4 MG SL tablet Place 1 tablet (0.4 mg total) under the tongue every 5 (five) minutes as needed for chest pain (up to 3 doses. If taking 3rd dose call 911). 25 tablet 3  . potassium chloride SA (KLOR-CON) 20 MEQ tablet Take 1 tablet (20 mEq total) by mouth daily. 90 tablet 3  . ticagrelor (BRILINTA) 90 MG TABS tablet Take 1 tablet (90 mg total) by mouth 2 (two) times daily. 180 tablet 3   No current facility-administered medications for this visit.    Allergies  Allergen Reactions  . Bee Venom Hives, Shortness Of  Breath and Swelling  . Nabumetone Anaphylaxis  . Amoxicillin-Pot Clavulanate Hives and Swelling    To lips.  . Aspirin Hives  . Atorvastatin Swelling     joint pain/swelling, inc liver tests  . Clopidogrel Bisulfate Hives  . Codeine Nausea And Vomiting  . Ezetimibe Other (See Comments)     fatigue  . Metformin And Related Other (See Comments)    Diarrhea   . Valsartan Other (See Comments)     fatigue  . Other Hives    **[place]**  SOB    Review of Systems negative except from HPI and PMH  Physical Exam BP 140/70 (BP Location: Left Arm, Patient Position: Sitting, Cuff Size: Normal)   Pulse (!) 49   Ht _0  (1.753 m)   Wt 187 lb (84.8 kg)   SpO2 96%   BMI 27.62 kg/m  Well developed and nourished in no acute distress HENT normal Neck supple with JVP-  Flat  Clear Regular rate and rhythm, no murmurs or gallops Abd-soft with active BS No Clubbing cyanosis edema Skin-warm and dry A & Oriented  Grossly normal sensory and motor function  ECG sinus @ 49  15/08/44  ECG 03/21/2020 at 0712 hours atrial fibrillation at 123 ECG 03/21/2020 0730 hrs. sinus at 68 Assessment and  Plan  Cryptogenic stroke  Implantable loop recorder end of service  Elevated blood  pressure  Hyperlipidemia  Carotid disease  Coronary disease s/p Cx stenting  Exertional chest pain consistent with angina-new  Atrial fibrillation paroxysmal   Sinus bradycardia w chronotropic incompetence   Chest pain is relatively stable.  On isosorbide.  Triggered with rapid rates.  Otherwise she is unaware of her atrial fibrillation.  Have given her prescription for propranolol 10 to be taken every hour x3 doses in the event that she has recurrent chest pain associated with a rapid heart rate as detected by her blood pressure machine.  The frequency of her atrial fibrillation does not dictate specific antiarrhythmic therapies.  This may well change.  Moreover, her resting bradycardia precludes a strategy of preemptive rate control.  Hence, the as needed rate control as noted above.  She is chronotropically competent.  Walking a flight of stairs with exhaustion, her peak heart rate was 73.  We have discussed the role of pacing for relief of symptoms. The benefits and risks were reviewed including but not limited to death,  perforation, infection, lead dislodgement and device malfunction.  The patient understands agrees and is willing to proceed.  She would like to think about it a little bit.  We will plan to regroup in 3-4 weeks.  With her atrial fibrillation not detected outside of the context of exogenous stimulants and with her prior history of stroke it is reasonable to transition her from antiplatelet therapy to anticoagulation.  We have reviewed the risks and benefits.  We will stop Brilinta and begin her on Eliquis 5 twice daily  The issue of anticholesterol therapy remains open  BP is reasonable controlled  No interval angina except in the context of tachycardia continue her isosorbide

## 2020-05-03 ENCOUNTER — Other Ambulatory Visit: Payer: Self-pay

## 2020-05-03 ENCOUNTER — Encounter: Payer: Self-pay | Admitting: Internal Medicine

## 2020-05-03 ENCOUNTER — Ambulatory Visit: Payer: HMO | Admitting: Internal Medicine

## 2020-05-03 VITALS — BP 140/70 | HR 49 | Ht 69.0 in | Wt 187.0 lb

## 2020-05-03 DIAGNOSIS — I480 Paroxysmal atrial fibrillation: Secondary | ICD-10-CM | POA: Diagnosis not present

## 2020-05-03 DIAGNOSIS — I10 Essential (primary) hypertension: Secondary | ICD-10-CM | POA: Diagnosis not present

## 2020-05-03 DIAGNOSIS — D485 Neoplasm of uncertain behavior of skin: Secondary | ICD-10-CM | POA: Diagnosis not present

## 2020-05-03 DIAGNOSIS — D2261 Melanocytic nevi of right upper limb, including shoulder: Secondary | ICD-10-CM | POA: Diagnosis not present

## 2020-05-03 DIAGNOSIS — D0461 Carcinoma in situ of skin of right upper limb, including shoulder: Secondary | ICD-10-CM | POA: Diagnosis not present

## 2020-05-03 DIAGNOSIS — D2262 Melanocytic nevi of left upper limb, including shoulder: Secondary | ICD-10-CM | POA: Diagnosis not present

## 2020-05-03 DIAGNOSIS — Z85828 Personal history of other malignant neoplasm of skin: Secondary | ICD-10-CM | POA: Diagnosis not present

## 2020-05-03 DIAGNOSIS — C44622 Squamous cell carcinoma of skin of right upper limb, including shoulder: Secondary | ICD-10-CM | POA: Diagnosis not present

## 2020-05-03 DIAGNOSIS — L821 Other seborrheic keratosis: Secondary | ICD-10-CM | POA: Diagnosis not present

## 2020-05-03 DIAGNOSIS — X32XXXA Exposure to sunlight, initial encounter: Secondary | ICD-10-CM | POA: Diagnosis not present

## 2020-05-03 DIAGNOSIS — I2510 Atherosclerotic heart disease of native coronary artery without angina pectoris: Secondary | ICD-10-CM | POA: Diagnosis not present

## 2020-05-03 DIAGNOSIS — D225 Melanocytic nevi of trunk: Secondary | ICD-10-CM | POA: Diagnosis not present

## 2020-05-03 DIAGNOSIS — L570 Actinic keratosis: Secondary | ICD-10-CM | POA: Diagnosis not present

## 2020-05-03 DIAGNOSIS — D0462 Carcinoma in situ of skin of left upper limb, including shoulder: Secondary | ICD-10-CM | POA: Diagnosis not present

## 2020-05-03 MED ORDER — APIXABAN 5 MG PO TABS: 5.0000 mg | ORAL_TABLET | Freq: Two times a day (BID) | ORAL | 11 refills | Status: DC

## 2020-05-03 MED ORDER — PROPRANOLOL HCL 10 MG PO TABS: ORAL_TABLET | ORAL | 1 refills | Status: AC

## 2020-05-03 NOTE — Patient Instructions (Addendum)
Medication Instructions:  - Your physician has recommended you make the following change in your medication:   1) STOP Brilinta  2) START Eliquis 5 mg- take 1 tablet by mouth twice daily  3) START Propranolol 10 mg- take 1 tablet every 1 hour x 3 doses as needed for chest pain/ fast heart rate  Samples Given: Eliquis 5 mg Lot: [id number] Exp: 09/2021 # 3 boxes given  *If you need a refill on your cardiac medications before your next appointment, please call your pharmacy*   Lab Work: - none ordered  If you have labs (blood work) drawn today and your tests are completely normal, you will receive your results only by: Marland Kitchen MyChart Message (if you have MyChart) OR . A paper copy in the mail If you have any lab test that is abnormal or we need to change your treatment, we will call you to review the results.   Testing/Procedures: - none ordered   Follow-Up: At [facility], you and your health needs are our priority.  As part of our continuing mission to provide you with exceptional heart care, we have created designated Provider Care Teams.  These Care Teams include your primary Cardiologist (physician) and Advanced Practice Providers (APPs -  Physician Assistants and Nurse Practitioners) who all work together to provide you with the care you need, when you need it.  We recommend signing up for the patient portal called "MyChart".  Sign up information is provided on this After Visit Summary.  MyChart is used to connect with patients for Virtual Visits (Telemedicine).  Patients are able to view lab/test results, encounter notes, upcoming appointments, etc.  Non-urgent messages can be sent to your provider as well.   To learn more about what you can do with MyChart, go to [email or web address].    Your next appointment:   3-4 week(s)  The format for your next appointment:   Virtual Visit   Provider:   Virl Axe, MD   Other Instructions   Propranolol Tablets What is this  medicine? PROPRANOLOL (proe PRAN oh lole) is a beta blocker. It decreases the amount of work your heart has to do and helps your heart beat regularly. It treats high blood pressure and/or prevent chest pain (also called angina). It is also used after a heart attack to prevent a second one. This medicine may be used for other purposes; ask your health care provider or pharmacist if you have questions. COMMON BRAND NAME(S): Inderal What should I tell my health care provider before I take this medicine? They need to know if you have any of these conditions:  circulation problems or blood vessel disease  diabetes  history of heart attack or heart disease, vasospastic angina  kidney disease  liver disease  lung or breathing disease, like asthma or emphysema  pheochromocytoma  slow heart rate  thyroid disease  an unusual or allergic reaction to propranolol, other beta-blockers, medicines, foods, dyes, or preservatives  pregnant or trying to get pregnant  breast-feeding How should I use this medicine? Take this drug by mouth. Take it as directed on the prescription label at the same time every day. Keep taking it unless your health care provider tells you to stop. Talk to your health care provider about the use of this drug in children. Special care may be needed. Overdosage: If you think you have taken too much of this medicine contact a poison control center or emergency room at once. NOTE: This medicine is only for you.  Do not share this medicine with others. What if I miss a dose? If you miss a dose, take it as soon as you can. If it is almost time for your next dose, take only that dose. Do not take double or extra doses. What may interact with this medicine? Do not take this medicine with any of the following medications:  feverfew  phenothiazines like chlorpromazine, mesoridazine, prochlorperazine, thioridazine This medicine may also interact with the following  medications:  aluminum hydroxide gel  antipyrine  antiviral medicines for HIV or AIDS  barbiturates like phenobarbital  certain medicines for blood pressure, heart disease, irregular heart beat  cimetidine  ciprofloxacin  diazepam  fluconazole  haloperidol  isoniazid  medicines for cholesterol like cholestyramine or colestipol  medicines for mental depression  medicines for migraine headache like almotriptan, eletriptan, frovatriptan, naratriptan, rizatriptan, sumatriptan, zolmitriptan  NSAIDs, medicines for pain and inflammation, like ibuprofen or naproxen  phenytoin  rifampin  teniposide  theophylline  thyroid medicines  tolbutamide  warfarin  zileuton This list may not describe all possible interactions. Give your health care provider a list of all the medicines, herbs, non-prescription drugs, or dietary supplements you use. Also tell them if you smoke, drink alcohol, or use illegal drugs. Some items may interact with your medicine. What should I watch for while using this medicine? Visit your doctor or health care professional for regular check ups. Check your blood pressure and pulse rate regularly. Ask your health care professional what your blood pressure and pulse rate should be, and when you should contact them. You may get drowsy or dizzy. Do not drive, use machinery, or do anything that needs mental alertness until you know how this drug affects you. Do not stand or sit up quickly, especially if you are an older patient. This reduces the risk of dizzy or fainting spells. Alcohol can make you more drowsy and dizzy. Avoid alcoholic drinks. This medicine may increase blood sugar. Ask your healthcare provider if changes in diet or medicines are needed if you have diabetes. Do not treat yourself for coughs, colds, or pain while you are taking this medicine without asking your doctor or health care professional for advice. Some ingredients may increase your  blood pressure. What side effects may I notice from receiving this medicine? Side effects that you should report to your doctor or health care professional as soon as possible:  allergic reactions like skin rash, itching or hives, swelling of the face, lips, or tongue  breathing problems  cold hands or feet  difficulty sleeping, nightmares  dry peeling skin  hallucinations  muscle cramps or weakness   signs and symptoms of high blood sugar such as being more thirsty or hungry or having to urinate more than normal. You may also feel very tired or have blurry vision.  slow heart rate  swelling of the legs and ankles  vomiting Side effects that usually do not require medical attention (report to your doctor or health care professional if they continue or are bothersome):  change in sex drive or performance  diarrhea  dry sore eyes  hair loss  nausea  weak or tired This list may not describe all possible side effects. Call your doctor for medical advice about side effects. You may report side effects to FDA at 1-800-FDA-1088. Where should I keep my medicine? Keep out of the reach of children and pets. Store at room temperature between 20 and 25 degrees C (68 and 77 degrees F). Protect from  light. Throw away any unused drug after the expiration date. NOTE: This sheet is a summary. It may not cover all possible information. If you have questions about this medicine, talk to your doctor, pharmacist, or health care provider.  2020 Elsevier/Gold Standard (2019-01-28 19:25:51)   Eliquis (Apixaban) oral tablets What is this medicine? APIXABAN (a PIX a ban) is an anticoagulant (blood thinner). It is used to lower the chance of stroke in people with a medical condition called atrial fibrillation. It is also used to treat or prevent blood clots in the lungs or in the veins. This medicine may be used for other purposes; ask your health care provider or pharmacist if you have  questions. COMMON BRAND NAME(S): Eliquis What should I tell my health care provider before I take this medicine? They need to know if you have any of these conditions:  antiphospholipid antibody syndrome  bleeding disorders  bleeding in the brain  blood in your stools (black or tarry stools) or if you have blood in your vomit  history of blood clots  history of stomach bleeding  kidney disease  liver disease  mechanical heart valve  an unusual or allergic reaction to apixaban, other medicines, foods, dyes, or preservatives  pregnant or trying to get pregnant  breast-feeding How should I use this medicine? Take this medicine by mouth with a glass of water. Follow the directions on the prescription label. You can take it with or without food. If it upsets your stomach, take it with food. Take your medicine at regular intervals. Do not take it more often than directed. Do not stop taking except on your doctor's advice. Stopping this medicine may increase your risk of a blood clot. Be sure to refill your prescription before you run out of medicine. Talk to your pediatrician regarding the use of this medicine in children. Special care may be needed. Overdosage: If you think you have taken too much of this medicine contact a poison control center or emergency room at once. NOTE: This medicine is only for you. Do not share this medicine with others. What if I miss a dose? If you miss a dose, take it as soon as you can. If it is almost time for your next dose, take only that dose. Do not take double or extra doses. What may interact with this medicine? This medicine may interact with the following:  aspirin and aspirin-like medicines  certain medicines for fungal infections like ketoconazole and itraconazole  certain medicines for seizures like carbamazepine and phenytoin  certain medicines that treat or prevent blood clots like warfarin, enoxaparin, and  dalteparin  clarithromycin  NSAIDs, medicines for pain and inflammation, like ibuprofen or naproxen  rifampin  ritonavir  St. John's wort This list may not describe all possible interactions. Give your health care provider a list of all the medicines, herbs, non-prescription drugs, or dietary supplements you use. Also tell them if you smoke, drink alcohol, or use illegal drugs. Some items may interact with your medicine. What should I watch for while using this medicine? Visit your healthcare professional for regular checks on your progress. You may need blood work done while you are taking this medicine. Your condition will be monitored carefully while you are receiving this medicine. It is important not to miss any appointments. Avoid sports and activities that might cause injury while you are using this medicine. Severe falls or injuries can cause unseen bleeding. Be careful when using sharp tools or knives. Consider using an  Copy. Take special care brushing or flossing your teeth. Report any injuries, bruising, or red spots on the skin to your healthcare professional. If you are going to need surgery or other procedure, tell your healthcare professional that you are taking this medicine. Wear a medical ID bracelet or chain. Carry a card that describes your disease and details of your medicine and dosage times. What side effects may I notice from receiving this medicine? Side effects that you should report to your doctor or health care professional as soon as possible:  allergic reactions like skin rash, itching or hives, swelling of the face, lips, or tongue  signs and symptoms of bleeding such as bloody or black, tarry stools; red or dark-brown urine; spitting up blood or brown material that looks like coffee grounds; red spots on the skin; unusual bruising or bleeding from the eye, gums, or nose  signs and symptoms of a blood clot such as chest pain; shortness of breath; pain,  swelling, or warmth in the leg  signs and symptoms of a stroke such as changes in vision; confusion; trouble speaking or understanding; severe headaches; sudden numbness or weakness of the face, arm or leg; trouble walking; dizziness; loss of coordination This list may not describe all possible side effects. Call your doctor for medical advice about side effects. You may report side effects to FDA at 1-800-FDA-1088. Where should I keep my medicine? Keep out of the reach of children. Store at room temperature between 20 and 25 degrees C (68 and 77 degrees F). Throw away any unused medicine after the expiration date. NOTE: This sheet is a summary. It may not cover all possible information. If you have questions about this medicine, talk to your doctor, pharmacist, or health care provider.  2020 Elsevier/Gold Standard (2018-03-03 17:39:34)

## 2020-05-15 DIAGNOSIS — C44622 Squamous cell carcinoma of skin of right upper limb, including shoulder: Secondary | ICD-10-CM | POA: Diagnosis not present

## 2020-05-18 ENCOUNTER — Other Ambulatory Visit: Payer: Self-pay

## 2020-05-18 NOTE — Patient Outreach (Signed)
  [facility]) Care Management Chronic Special Needs Program    05/18/2020  Name: Margaret Vazquez, DOB: 1936/01/08  MRN: [id number]   Margaret Vazquez is enrolled in a chronic special needs plan for Diabetes.  [facility] will continue to provide services for this client through 07/06/2020. The [facility] care management team will assume care 07/07/2020.  Peter Garter RN, Jackquline Denmark, CDE Chronic Care Management Coordinator [facility] [phone]

## 2020-06-04 ENCOUNTER — Telehealth: Payer: Self-pay | Admitting: Internal Medicine

## 2020-06-04 NOTE — Telephone Encounter (Signed)
  Patient Consent for Virtual Visit         ILO Margaret Vazquez has provided verbal consent on 06/04/2020 for a virtual visit (video or telephone).   CONSENT FOR VIRTUAL VISIT FOR:  Margaret Vazquez  By participating in this virtual visit I agree to the following:  I hereby voluntarily request, consent and authorize [facility] and its employed or contracted physicians, physician assistants, nurse practitioners or other licensed health care professionals (the Practitioner), to provide me with telemedicine health care services (the "Services") as deemed necessary by the treating Practitioner. I acknowledge and consent to receive the Services by the Practitioner via telemedicine. I understand that the telemedicine visit will involve communicating with the Practitioner through live audiovisual communication technology and the disclosure of certain medical information by electronic transmission. I acknowledge that I have been given the opportunity to request an in-person assessment or other available alternative prior to the telemedicine visit and am voluntarily participating in the telemedicine visit.  I understand that I have the right to withhold or withdraw my consent to the use of telemedicine in the course of my care at any time, without affecting my right to future care or treatment, and that the Practitioner or I may terminate the telemedicine visit at any time. I understand that I have the right to inspect all information obtained and/or recorded in the course of the telemedicine visit and may receive copies of available information for a reasonable fee.  I understand that some of the potential risks of receiving the Services via telemedicine include:  Margaret Vazquez Delay or interruption in medical evaluation due to technological equipment failure or disruption; . Information transmitted may not be sufficient (e.g. poor resolution of images) to allow for appropriate medical decision making by the Practitioner;  and/or  . In rare instances, security protocols could fail, causing a breach of personal health information.  Furthermore, I acknowledge that it is my responsibility to provide information about my medical history, conditions and care that is complete and accurate to the best of my ability. I acknowledge that Practitioner's advice, recommendations, and/or decision may be based on factors not within their control, such as incomplete or inaccurate data provided by me or distortions of diagnostic images or specimens that may result from electronic transmissions. I understand that the practice of medicine is not an exact science and that Practitioner makes no warranties or guarantees regarding treatment outcomes. I acknowledge that a copy of this consent can be made available to me via my patient portal ([place]), or I can request a printed copy by calling the office of [facility].    I understand that my insurance will be billed for this visit.   I have read or had this consent read to me. . I understand the contents of this consent, which adequately explains the benefits and risks of the Services being provided via telemedicine.  . I have been provided ample opportunity to ask questions regarding this consent and the Services and have had my questions answered to my satisfaction. . I give my informed consent for the services to be provided through the use of telemedicine in my medical care

## 2020-06-06 DIAGNOSIS — D0462 Carcinoma in situ of skin of left upper limb, including shoulder: Secondary | ICD-10-CM | POA: Diagnosis not present

## 2020-06-07 ENCOUNTER — Other Ambulatory Visit: Payer: Self-pay

## 2020-06-07 ENCOUNTER — Encounter: Payer: Self-pay | Admitting: Internal Medicine

## 2020-06-07 ENCOUNTER — Telehealth (INDEPENDENT_AMBULATORY_CARE_PROVIDER_SITE_OTHER): Payer: HMO | Admitting: Internal Medicine

## 2020-06-07 VITALS — BP 149/65 | HR 53 | Ht 68.0 in | Wt 189.0 lb

## 2020-06-07 DIAGNOSIS — I10 Essential (primary) hypertension: Secondary | ICD-10-CM | POA: Diagnosis not present

## 2020-06-07 DIAGNOSIS — R001 Bradycardia, unspecified: Secondary | ICD-10-CM | POA: Diagnosis not present

## 2020-06-07 DIAGNOSIS — I639 Cerebral infarction, unspecified: Secondary | ICD-10-CM | POA: Diagnosis not present

## 2020-06-07 DIAGNOSIS — I480 Paroxysmal atrial fibrillation: Secondary | ICD-10-CM

## 2020-06-07 NOTE — Patient Instructions (Signed)
Medication Instructions:  - Your physician recommends that you continue on your current medications as directed. Please refer to the Current Medication list given to you today.  *If you need a refill on your cardiac medications before your next appointment, please call your pharmacy*   Lab Work: - none ordered  If you have labs (blood work) drawn today and your tests are completely normal, you will receive your results only by: Marland Kitchen MyChart Message (if you have MyChart) OR . A paper copy in the mail If you have any lab test that is abnormal or we need to change your treatment, we will call you to review the results.   Testing/Procedures: - none ordered   Follow-Up: At [facility], you and your health needs are our priority.  As part of our continuing mission to provide you with exceptional heart care, we have created designated Provider Care Teams.  These Care Teams include your primary Cardiologist (physician) and Advanced Practice Providers (APPs -  Physician Assistants and Nurse Practitioners) who all work together to provide you with the care you need, when you need it.  We recommend signing up for the patient portal called "MyChart".  Sign up information is provided on this After Visit Summary.  MyChart is used to connect with patients for Virtual Visits (Telemedicine).  Patients are able to view lab/test results, encounter notes, upcoming appointments, etc.  Non-urgent messages can be sent to your provider as well.   To learn more about what you can do with MyChart, go to [email or web address].    Your next appointment:   [facility] January 2022   The format for your next appointment:   In Person  Provider:   Virl Axe, MD   Other Instructions n/a

## 2020-06-07 NOTE — Progress Notes (Signed)
Electrophysiology TeleHealth Note   Due to national recommendations of social distancing due to COVID 19, an audio/video telehealth visit is felt to be most appropriate for this patient at this time.  See MyChart message from today for the patient's consent to telehealth for [name].   Date:  06/07/2020   ID:  Margaret Vazquez, DOB 1936-02-12, MRN [id number]  Location: patient's home  Provider location: [address] [place]  Evaluation Performed: Follow-up visit  PCP:  Abner Greenspan, MD    Electrophysiologist:  SK   Chief Complaint:  Dyspnea   History of Present Illness:    Margaret Vazquez is a 84 y.o. female who presents via audio/video conferencing for a telehealth visit today.  Since last being seen in our clinic for paroxysmal atrial fibrillation and rapid rates associated with chest pain, dyspnea on exertion associated with chronotropic incompetence for which we were considering pacing the patient reports 1) her sisters cancer has resolved " a miracle" 2) Remains fatigued and dyspneic with activity, but without chest pain or edema  Anticoagulation with apixoban without bleeding   She has Carotid disease but has been intolerant of statins and has been referred for PCSK9 therapy  DATE TEST EF   6/19 LHC 55-65% CXp/m-80>>stent RCAm 30%  10/21 Echo   65-70 %         Date Cr K Hgb  9/21 0.98 3.9 15.0         Thromboembolic risk factors ( age  -2, HTN-1, TIA/CVA-2, Vasc disease -1, Gender-1) for a CHADSVASc Score of >=7     Past Medical History:  Diagnosis Date  . Allergy history, drug    Aspirin  . Basal cell carcinoma    "back"  . CAD (coronary artery disease)    a. Previously nonobstructive then progressive angina with abnl CT -> cardiac cath 12/24/17 showed 30% mid RCA and 80% prox-mid Cx. She received DES to mid AV groove Cx. EF 55-65%.   . Cervical stenosis of spine    With neck pain  . CKD (chronic kidney disease), stage II    . Colon polyps 2009  . Complication of anesthesia 1980s   slow to wake after anesthesia "when I had breast biopsy"  . GERD (gastroesophageal reflux disease)   . Hyperlipidemia    myalgias with Lipitor and Zetia  . Hypertension   . Migraine    "stopped at age 49" (12/24/2017)  . Squamous carcinoma    "iced off and cut off; mostly arms" (12/24/2017)  . Stroke [facility])    "told me I'd had 2 strokes in 02/2013"; denies residual on 12/24/2017  . TIA (transient ischemic attack)   . Type II diabetes mellitus ([place])     Past Surgical History:  Procedure Laterality Date  . ABD [place]  07/2003   Negative  . APPENDECTOMY    . BASAL CELL CARCINOMA EXCISION     "back"  . BREAST BIOPSY Left 1992   benign  . CARDIAC CATHETERIZATION  04/07/2011   non obst CAD (Dr Burt Knack)  . CATARACT EXTRACTION W/PHACO Left 04/01/2017   Procedure: CATARACT EXTRACTION PHACO AND INTRAOCULAR LENS PLACEMENT ([place]) LEFT DIABETIC;  Surgeon: Leandrew Koyanagi, MD;  Location: [facility];  Service: Ophthalmology;  Laterality: Left;  Diabetic - oral meds  . CATARACT EXTRACTION W/PHACO Right 04/29/2017   Procedure: CATARACT EXTRACTION PHACO AND INTRAOCULAR LENS PLACEMENT ([place]) RIGHT DIABETIC;  Surgeon: Leandrew Koyanagi, MD;  Location: [place];  Service: Ophthalmology;  Laterality: Right;  Diabetic - oral meds  . COLONOSCOPY  12/2007   Adenomatous colon polyps  . CORONARY ANGIOPLASTY WITH STENT PLACEMENT  12/24/2017  . CORONARY STENT INTERVENTION N/A 12/24/2017   Procedure: CORONARY STENT INTERVENTION;  Surgeon: Lorretta Harp, MD;  Location: [place] CV LAB;  Service: Cardiovascular;  Laterality: N/A;  . CYSTOSCOPY W/ DECANNULATION  03/2000   Normal  . LEFT HEART CATH AND CORONARY ANGIOGRAPHY N/A 12/24/2017   Procedure: LEFT HEART CATH AND CORONARY ANGIOGRAPHY;  Surgeon: Lorretta Harp, MD;  Location: [place] CV LAB;  Service: Cardiovascular;  Laterality: N/A;  . LOOP RECORDER IMPLANT N/A  02/16/2013   Procedure: LOOP RECORDER IMPLANT;  Surgeon: Deboraha Sprang, MD;  Location: [facility] CATH LAB;  Service: Cardiovascular;  Laterality: N/A;  . MOHS SURGERY     right hand   . NASAL SINUS SURGERY  01/2005  . SQUAMOUS CELL CARCINOMA EXCISION     "mostly arms;" (12/24/2017)  . STRESS CARDIOLITE  11/1999   Normal/ negative  . TEAR DUCT PROBING  2005   "? side"  . TEE WITHOUT CARDIOVERSION N/A 02/16/2013   Procedure: TRANSESOPHAGEAL ECHOCARDIOGRAM (TEE);  Surgeon: Josue Hector, MD;  Location: [facility] ENDOSCOPY;  Service: Cardiovascular;  Laterality: N/A;  . TUBAL LIGATION     BTL    Current Outpatient Medications  Medication Sig Dispense Refill  . apixaban (ELIQUIS) 5 MG TABS tablet Take 1 tablet (5 mg total) by mouth 2 (two) times daily. 60 tablet 11  . Blood Glucose Monitoring Suppl (ONE TOUCH ULTRA 2) w/Device KIT Use to check blood sugar once daily 1 each 0  . diphenhydrAMINE (BENADRYL) 25 MG tablet Take 50 mg by mouth every 8 (eight) hours as needed for allergies.     Marland Kitchen EPINEPHrine (EPI-PEN) 0.3 mg/0.3 mL DEVI Inject 0.3 mg into the muscle daily as needed (allergic reaction).     Marland Kitchen erythromycin ophthalmic ointment Place into the left eye as needed. Use on eyelid as needed    . fluticasone (FLONASE) 50 MCG/ACT nasal spray Place 2 sprays into both nostrils 2 (two) times daily as needed. Reported on 09/10/2015    . glipiZIDE (GLUCOTROL XL) 10 MG 24 hr tablet Take 1 tablet (10 mg total) by mouth daily with breakfast. 90 tablet 3  . glucose blood (ONETOUCH ULTRA) test strip USE TO CHECK BLOOD SUGAR ONCE DAILY (DX CODE: E11.51) 100 each 1  . isosorbide mononitrate (IMDUR) 60 MG 24 hr tablet Take 1 tablet (60 mg total) by mouth daily. 90 tablet 3  . losartan-hydrochlorothiazide (HYZAAR) 50-12.5 MG tablet Take 0.5 tablets by mouth daily. 45 tablet 3  . metFORMIN (GLUCOPHAGE-XR) 500 MG 24 hr tablet TAKE 1 TABLET(500 MG) BY MOUTH DAILY WITH BREAKFAST 90 tablet 3  . neomycin-polymyxin-dexameth  (MAXITROL) 0.1 % OINT Place into the left eye as needed.     . nitroGLYCERIN (NITROSTAT) 0.4 MG SL tablet Place 1 tablet (0.4 mg total) under the tongue every 5 (five) minutes as needed for chest pain (up to 3 doses. If taking 3rd dose call 911). 25 tablet 3  . potassium chloride SA (KLOR-CON) 20 MEQ tablet Take 1 tablet (20 mEq total) by mouth daily. 90 tablet 3  . propranolol (INDERAL) 10 MG tablet Take 1 tablet (10 mg) by mouth every 1 hour x 3 doses as needed for chest pain/ fast heart rates 30 tablet 1   No current facility-administered medications for this visit.    Allergies:  Bee venom, Nabumetone, Amoxicillin-pot clavulanate, Aspirin, Atorvastatin, Clopidogrel bisulfate, Codeine, Ezetimibe, Metformin and related, Valsartan, and Other   Social History:  The patient  reports that she has never smoked. She has never used smokeless tobacco. She reports that she does not drink alcohol and does not use drugs.   Family History:  The patient's   family history includes Brain cancer in an other family member; Breast cancer in her sister; Diabetes in her brother and sister; Heart disease in her father and mother; Lung cancer in her brother; Pancreatic cancer in her brother; Skin cancer in her daughter.   ROS:  Please see the history of present illness.   All other systems are personally reviewed and negative.    Exam:    Vital Signs:  BP (!) 149/65   Pulse (!) 53   Ht 5' 8"  (1.727 m)   Wt 189 lb (85.7 kg)   BMI 28.74 kg/m        Labs/Other Tests and Data Reviewed:    Recent Labs: 09/14/2019: TSH 4.10 03/21/2020: Hemoglobin 15.0; Magnesium 2.2; Platelets 219 03/22/2020: ALT 14; BUN 15; Creatinine, Ser 0.98; Potassium 3.9; Sodium 140   Wt Readings from Last 3 Encounters:  06/07/20 189 lb (85.7 kg)  05/03/20 187 lb (84.8 kg)  03/30/20 189 lb (85.7 kg)     Other studies personally reviewed: Additional studies/ records that were reviewed today include:   Review of the above records  today demonstrates:   ASSESSMENT & PLAN:    Cryptogenic stroke  Implantable loop recorder end of service  Elevated blood pressure  Hyperlipidemia  Carotid disease  Coronary disease s/p Cx stenting  Exertional chest pain consistent with angina-new  Atrial fibrillation paroxysmal   Sinus bradycardia w chronotropic incompetence  No interval atrial fibrillation of which she is aware.  Continues to complain of exercise intolerance.  Discussed again the role of pacing for the relief of symptoms.  She would like to proceed, but would like to wait until after the new year and I would like to wait until  after the middle of January when she is scheduled to have other skin cancers resected.  Euvolemic continue current meds  On Anticoagulation;  No bleeding issues   Would anticipate loop recorder removal    COVID 19 screen The patient denies symptoms of COVID 19 at this time.  The importance of social distancing was discussed today.  Follow-up:  Mid January office visit    Current medicines are reviewed at length with the patient today.   The patient does not have concerns regarding her medicines.  The following changes were made today:  none  Labs/ tests ordered today include:   No orders of the defined types were placed in this encounter.   Future tests ( post COVID )    Patient Risk:  after full review of this patients clinical status, I feel that they are at moderate  risk at this time.  Today, I have spent 6* minutes with the patient with telehealth technology discussing the above.  Signed, Virl Axe, MD  06/07/2020 12:02 PM     [place] [address] [place] [address] [phone] (office) [phone] (fax)

## 2020-06-20 DIAGNOSIS — L089 Local infection of the skin and subcutaneous tissue, unspecified: Secondary | ICD-10-CM | POA: Diagnosis not present

## 2020-07-04 ENCOUNTER — Other Ambulatory Visit: Payer: Self-pay | Admitting: Family Medicine

## 2020-07-04 NOTE — Telephone Encounter (Signed)
Will route to cardiology since they changed instructions on med

## 2020-07-13 ENCOUNTER — Other Ambulatory Visit: Payer: Self-pay

## 2020-07-16 DIAGNOSIS — C44622 Squamous cell carcinoma of skin of right upper limb, including shoulder: Secondary | ICD-10-CM | POA: Diagnosis not present

## 2020-07-16 DIAGNOSIS — D0461 Carcinoma in situ of skin of right upper limb, including shoulder: Secondary | ICD-10-CM | POA: Diagnosis not present

## 2020-07-26 ENCOUNTER — Ambulatory Visit: Payer: HMO | Admitting: Internal Medicine

## 2020-07-26 ENCOUNTER — Other Ambulatory Visit: Payer: Self-pay

## 2020-07-26 ENCOUNTER — Encounter: Payer: Self-pay | Admitting: Internal Medicine

## 2020-07-26 VITALS — BP 138/60 | HR 62 | Ht 69.0 in | Wt 189.0 lb

## 2020-07-26 DIAGNOSIS — I480 Paroxysmal atrial fibrillation: Secondary | ICD-10-CM

## 2020-07-26 DIAGNOSIS — I10 Essential (primary) hypertension: Secondary | ICD-10-CM

## 2020-07-26 DIAGNOSIS — I639 Cerebral infarction, unspecified: Secondary | ICD-10-CM | POA: Diagnosis not present

## 2020-07-26 DIAGNOSIS — R001 Bradycardia, unspecified: Secondary | ICD-10-CM | POA: Diagnosis not present

## 2020-07-26 NOTE — Patient Instructions (Signed)
Medication Instructions:  °- Your physician recommends that you continue on your current medications as directed. Please refer to the Current Medication list given to you today. ° °*If you need a refill on your cardiac medications before your next appointment, please call your pharmacy* ° ° °Lab Work: °- none ordered ° °If you have labs (blood work) drawn today and your tests are completely normal, you will receive your results only by: °• MyChart Message (if you have MyChart) OR °• A paper copy in the mail °If you have any lab test that is abnormal or we need to change your treatment, we will call you to review the results. ° ° °Testing/Procedures: °- none ordered ° ° °Follow-Up: °At [facility], you and your health needs are our priority.  As part of our continuing mission to provide you with exceptional heart care, we have created designated Provider Care Teams.  These Care Teams include your primary Cardiologist (physician) and Advanced Practice Providers (APPs -  Physician Assistants and Nurse Practitioners) who all work together to provide you with the care you need, when you need it. ° °We recommend signing up for the patient portal called "MyChart".  Sign up information is provided on this After Visit Summary.  MyChart is used to connect with patients for Virtual Visits (Telemedicine).  Patients are able to view lab/test results, encounter notes, upcoming appointments, etc.  Non-urgent messages can be sent to your provider as well.   °To learn more about what you can do with MyChart, go to [email or web address].   ° °Your next appointment:   °4 month(s) ° °The format for your next appointment:   °In Person ° °Provider:   °Steven Klein, MD ° ° °Other Instructions °n/a ° °

## 2020-07-26 NOTE — Progress Notes (Signed)
Patient Care Team: Tower, [name], MD as PCP - General (Family Medicine) Deboraha Sprang, MD as PCP - Cardiology (Cardiology) Deboraha Sprang, MD as PCP - Electrophysiology (Cardiology) Dasher, Rayvon Char, MD as Consulting Physician (Dermatology) Salomon Fick., MD as Referring Physician (Dentistry) Leandrew Koyanagi, MD as Referring Physician (Ophthalmology)   HPI  Margaret Vazquez is a 85 y.o. female Seen in followup for a loop recorder implanted for cryptogenic stroke. She has significant hypertension which is much improved  She was found to have atrial fibrillation that prompted a visit to the atrial fibrillation clinic.This event occurred in the context of treatment anaphylaxis w epinephrine.. Discussion with Dr. Leonie Man suggested no indication for anticoagulation    Subsequently seen in the emergency room 03/21/2020 with atrial fibrillation with a rapid rate.  Associated with chest pain.  2019 coronary artery disease-CT>> abnormal FFR; circumflex was stented     Currently taking Brilinta.  Is allergic to aspirin and Plavix.  She was transitioned to Eliquis   Previously we had identified chronotropic incompetence potentially contributing to her significant exercise intolerance, walking the stairs her peak heart rate in the office was 73.  Discussed the role of pacemaker implantation but she had wanted to defer until January.  Continues with significant exercise intolerance but "lots going on.   She has Carotid disease but has been intolerant of statins and has been referred for PCSK9 therapy  DATE TEST EF   6/19 LHC 55-65% CXp/m-80>>stent RCAm 30%  10/21 Echo   65-70 %         Date Cr K Hgb  9/21 0.98 3.9 15.0         Thromboembolic risk factors ( age  -2, HTN-1, TIA/CVA-2, Vasc disease -1, Gender-1) for a CHADSVASc Score of >=7      Past Medical History:  Diagnosis Date  . Allergy history, drug    Aspirin  . Basal cell carcinoma    "back"  . CAD  (coronary artery disease)    a. Previously nonobstructive then progressive angina with abnl CT -> cardiac cath 12/24/17 showed 30% mid RCA and 80% prox-mid Cx. She received DES to mid AV groove Cx. EF 55-65%.   . Cervical stenosis of spine    With neck pain  . CKD (chronic kidney disease), stage II   . Colon polyps 2009  . Complication of anesthesia 1980s   slow to wake after anesthesia "when I had breast biopsy"  . GERD (gastroesophageal reflux disease)   . Hyperlipidemia    myalgias with Lipitor and Zetia  . Hypertension   . Migraine    "stopped at age 45" (12/24/2017)  . Squamous carcinoma    "iced off and cut off; mostly arms" (12/24/2017)  . Stroke [facility])    "told me I'd had 2 strokes in 02/2013"; denies residual on 12/24/2017  . TIA (transient ischemic attack)   . Type II diabetes mellitus ([place])     Past Surgical History:  Procedure Laterality Date  . ABD [place]  07/2003   Negative  . APPENDECTOMY    . BASAL CELL CARCINOMA EXCISION     "back"  . BREAST BIOPSY Left 1992   benign  . CARDIAC CATHETERIZATION  04/07/2011   non obst CAD (Dr Burt Knack)  . CATARACT EXTRACTION W/PHACO Left 04/01/2017   Procedure: CATARACT EXTRACTION PHACO AND INTRAOCULAR LENS PLACEMENT ([place]) LEFT DIABETIC;  Surgeon: Leandrew Koyanagi, MD;  Location: [place];  Service: Ophthalmology;  Laterality: Left;  Diabetic - oral meds  . CATARACT EXTRACTION W/PHACO Right 04/29/2017   Procedure: CATARACT EXTRACTION PHACO AND INTRAOCULAR LENS PLACEMENT ([place]) RIGHT DIABETIC;  Surgeon: Leandrew Koyanagi, MD;  Location: [facility];  Service: Ophthalmology;  Laterality: Right;  Diabetic - oral meds  . COLONOSCOPY  12/2007   Adenomatous colon polyps  . CORONARY ANGIOPLASTY WITH STENT PLACEMENT  12/24/2017  . CORONARY STENT INTERVENTION N/A 12/24/2017   Procedure: CORONARY STENT INTERVENTION;  Surgeon: Lorretta Harp, MD;  Location: [place] CV LAB;  Service: Cardiovascular;  Laterality: N/A;  .  CYSTOSCOPY W/ DECANNULATION  03/2000   Normal  . LEFT HEART CATH AND CORONARY ANGIOGRAPHY N/A 12/24/2017   Procedure: LEFT HEART CATH AND CORONARY ANGIOGRAPHY;  Surgeon: Lorretta Harp, MD;  Location: [place] CV LAB;  Service: Cardiovascular;  Laterality: N/A;  . LOOP RECORDER IMPLANT N/A 02/16/2013   Procedure: LOOP RECORDER IMPLANT;  Surgeon: Deboraha Sprang, MD;  Location: [facility] CATH LAB;  Service: Cardiovascular;  Laterality: N/A;  . MOHS SURGERY     right hand   . NASAL SINUS SURGERY  01/2005  . SQUAMOUS CELL CARCINOMA EXCISION     "mostly arms;" (12/24/2017)  . STRESS CARDIOLITE  11/1999   Normal/ negative  . TEAR DUCT PROBING  2005   "? side"  . TEE WITHOUT CARDIOVERSION N/A 02/16/2013   Procedure: TRANSESOPHAGEAL ECHOCARDIOGRAM (TEE);  Surgeon: Josue Hector, MD;  Location: [facility] ENDOSCOPY;  Service: Cardiovascular;  Laterality: N/A;  . TUBAL LIGATION     BTL    Current Outpatient Medications  Medication Sig Dispense Refill  . apixaban (ELIQUIS) 5 MG TABS tablet Take 1 tablet (5 mg total) by mouth 2 (two) times daily. 60 tablet 11  . Blood Glucose Monitoring Suppl (ONE TOUCH ULTRA 2) w/Device KIT Use to check blood sugar once daily 1 each 0  . diphenhydrAMINE (BENADRYL) 25 MG tablet Take 50 mg by mouth every 8 (eight) hours as needed for allergies.     Marland Kitchen EPINEPHrine (EPI-PEN) 0.3 mg/0.3 mL DEVI Inject 0.3 mg into the muscle daily as needed (allergic reaction).     Marland Kitchen erythromycin ophthalmic ointment Place into the left eye as needed. Use on eyelid as needed    . fluticasone (FLONASE) 50 MCG/ACT nasal spray Place 2 sprays into both nostrils 2 (two) times daily as needed. Reported on 09/10/2015    . glipiZIDE (GLUCOTROL XL) 10 MG 24 hr tablet Take 1 tablet (10 mg total) by mouth daily with breakfast. 90 tablet 3  . glucose blood (ONETOUCH ULTRA) test strip USE TO CHECK BLOOD SUGAR ONCE DAILY (DX CODE: E11.51) 100 each 1  . isosorbide mononitrate (IMDUR) 60 MG 24 hr tablet Take 1 tablet  (60 mg total) by mouth daily. 90 tablet 3  . losartan-hydrochlorothiazide (HYZAAR) 50-12.5 MG tablet Take 0.5 tablet (25-6.25 mg) by mouth once daily 45 tablet 3  . metFORMIN (GLUCOPHAGE-XR) 500 MG 24 hr tablet TAKE 1 TABLET(500 MG) BY MOUTH DAILY WITH BREAKFAST 90 tablet 3  . neomycin-polymyxin-dexameth (MAXITROL) 0.1 % OINT Place into the left eye as needed.     . nitroGLYCERIN (NITROSTAT) 0.4 MG SL tablet Place 1 tablet (0.4 mg total) under the tongue every 5 (five) minutes as needed for chest pain (up to 3 doses. If taking 3rd dose call 911). 25 tablet 3  . potassium chloride SA (KLOR-CON) 20 MEQ tablet Take 1 tablet (20 mEq total) by mouth daily. 90 tablet 3  . propranolol (INDERAL)  10 MG tablet Take 1 tablet (10 mg) by mouth every 1 hour x 3 doses as needed for chest pain/ fast heart rates 30 tablet 1   No current facility-administered medications for this visit.    Allergies  Allergen Reactions  . Bee Venom Hives, Shortness Of Breath and Swelling  . Nabumetone Anaphylaxis  . Amoxicillin-Pot Clavulanate Hives and Swelling    To lips.  . Aspirin Hives  . Atorvastatin Swelling     joint pain/swelling, inc liver tests  . Clopidogrel Bisulfate Hives  . Codeine Nausea And Vomiting  . Ezetimibe Other (See Comments)     fatigue  . Metformin And Related Other (See Comments)    Diarrhea   . Valsartan Other (See Comments)     fatigue  . Other Hives    **Red Meat**  SOB    Review of Systems negative except from HPI and PMH  Physical Exam BP 138/60 (BP Location: Left Arm, Patient Position: Sitting, Cuff Size: Normal)   Pulse 62   Ht 5' 9"  (1.753 m)   Wt 189 lb (85.7 kg)   SpO2 98%   BMI 27.91 kg/m  Well developed and nourished in no acute distress HENT normal Neck supple with JVP-  flat   Clear Regular rate and rhythm, no murmurs or gallops Abd-soft with active BS No Clubbing cyanosis edema Skin-warm and dry A & Oriented  Grossly normal sensory and motor function  ECG  sinus at 62 Interval 17/08/40 Assessment and  Plan  Cryptogenic stroke  Implantable loop recorder end of service  Elevated blood pressure  Hyperlipidemia  Carotid disease  Coronary disease s/p Cx stenting  Exertional chest pain consistent with angina-new  Atrial fibrillation paroxysmal   Sinus bradycardia w chronotropic incompetence    No significant interval atrial fibrillation.  Continue anticoagulation.   Continues with shortness of breath.  Exercise today in the office was able to generate a heart rate of 80 after 2 flights of stairs.  Moderate shortness of breath.  No chest pain.  Continue to wait for PCSK9 consultation  Blood pressures are better.  She would like to defer decision regarding pacing for her chronotropic incompetence.  She hopes it will get better.  I think her limitations are probably more related to her knee into her cardiovascular status and will defer we will plan to revisit this in about 4 months

## 2020-07-27 ENCOUNTER — Other Ambulatory Visit: Payer: Self-pay | Admitting: Internal Medicine

## 2020-07-31 ENCOUNTER — Telehealth: Payer: Self-pay | Admitting: *Deleted

## 2020-07-31 DIAGNOSIS — Z006 Encounter for examination for normal comparison and control in clinical research program: Secondary | ICD-10-CM

## 2020-07-31 DIAGNOSIS — H02035 Senile entropion of left lower eyelid: Secondary | ICD-10-CM | POA: Diagnosis not present

## 2020-07-31 NOTE — Telephone Encounter (Signed)
Spoke to pt via phone for [place] 4 Visit 5. Pt is doing well. No Ae/ SAE or medication changes to report. Pt can't remember why she stopped IP, but does not wish to restart at this time. Next phone visit schedule for July 2022

## 2020-07-31 NOTE — Telephone Encounter (Signed)
LVM for pt to call CV research at [phone] for [facility] 4 phone visit

## 2020-08-10 ENCOUNTER — Telehealth: Payer: Self-pay | Admitting: *Deleted

## 2020-08-10 DIAGNOSIS — Z006 Encounter for examination for normal comparison and control in clinical research program: Secondary | ICD-10-CM

## 2020-08-10 NOTE — Telephone Encounter (Addendum)
Called pt to inform her of the new addendum to the [place] 4 consent. Explained to her the  addendum (version [id number]) is to provide her with new information on the study drug Inclisiran, which has been FDA approved for clinical use and no new side effects were reported. Pt verbalized understanding. A copy of the informed consent was mailed to the pt.

## 2020-08-30 DIAGNOSIS — H02132 Senile ectropion of right lower eyelid: Secondary | ICD-10-CM | POA: Diagnosis not present

## 2020-09-10 DIAGNOSIS — H02103 Unspecified ectropion of right eye, unspecified eyelid: Secondary | ICD-10-CM | POA: Insufficient documentation

## 2020-09-10 DIAGNOSIS — H02132 Senile ectropion of right lower eyelid: Secondary | ICD-10-CM | POA: Insufficient documentation

## 2020-09-10 DIAGNOSIS — H04563 Stenosis of bilateral lacrimal punctum: Secondary | ICD-10-CM | POA: Insufficient documentation

## 2020-09-10 DIAGNOSIS — H04223 Epiphora due to insufficient drainage, bilateral lacrimal glands: Secondary | ICD-10-CM | POA: Insufficient documentation

## 2020-09-17 ENCOUNTER — Telehealth: Payer: Self-pay | Admitting: Family Medicine

## 2020-09-17 DIAGNOSIS — E1151 Type 2 diabetes mellitus with diabetic peripheral angiopathy without gangrene: Secondary | ICD-10-CM

## 2020-09-17 DIAGNOSIS — I10 Essential (primary) hypertension: Secondary | ICD-10-CM

## 2020-09-17 DIAGNOSIS — E785 Hyperlipidemia, unspecified: Secondary | ICD-10-CM

## 2020-09-17 DIAGNOSIS — E538 Deficiency of other specified B group vitamins: Secondary | ICD-10-CM

## 2020-09-17 DIAGNOSIS — E1169 Type 2 diabetes mellitus with other specified complication: Secondary | ICD-10-CM

## 2020-09-17 NOTE — Telephone Encounter (Signed)
-----   Message from Ellamae Sia sent at 09/03/2020 10:52 AM EST ----- Regarding: Lab orders for Tuesday, 3.15.22 Patient is scheduled for CPX labs, please order future labs, Thanks , Karna Christmas

## 2020-09-18 ENCOUNTER — Other Ambulatory Visit (INDEPENDENT_AMBULATORY_CARE_PROVIDER_SITE_OTHER): Payer: HMO

## 2020-09-18 ENCOUNTER — Other Ambulatory Visit: Payer: Self-pay

## 2020-09-18 DIAGNOSIS — E785 Hyperlipidemia, unspecified: Secondary | ICD-10-CM

## 2020-09-18 DIAGNOSIS — E1169 Type 2 diabetes mellitus with other specified complication: Secondary | ICD-10-CM

## 2020-09-18 DIAGNOSIS — E538 Deficiency of other specified B group vitamins: Secondary | ICD-10-CM | POA: Diagnosis not present

## 2020-09-18 DIAGNOSIS — I10 Essential (primary) hypertension: Secondary | ICD-10-CM | POA: Diagnosis not present

## 2020-09-18 DIAGNOSIS — E1151 Type 2 diabetes mellitus with diabetic peripheral angiopathy without gangrene: Secondary | ICD-10-CM

## 2020-09-18 LAB — COMPREHENSIVE METABOLIC PANEL
ALT: 11 U/L (ref 0–35)
AST: 13 U/L (ref 0–37)
Albumin: 3.9 g/dL (ref 3.5–5.2)
Alkaline Phosphatase: 66 U/L (ref 39–117)
BUN: 18 mg/dL (ref 6–23)
CO2: 30 mEq/L (ref 19–32)
Calcium: 9.1 mg/dL (ref 8.4–10.5)
Chloride: 105 mEq/L (ref 96–112)
Creatinine, Ser: 0.9 mg/dL (ref 0.40–1.20)
GFR: 59.35 mL/min — ABNORMAL LOW (ref >60.00)
Glucose, Bld: 85 mg/dL (ref 70–99)
Potassium: 4.2 mEq/L (ref 3.5–5.1)
Sodium: 142 mEq/L (ref 135–145)
Total Bilirubin: 0.6 mg/dL (ref 0.2–1.2)
Total Protein: 6.6 g/dL (ref 6.0–8.3)

## 2020-09-18 LAB — LIPID PANEL
Cholesterol: 187 mg/dL (ref 0–200)
HDL: 51 mg/dL (ref >39.00)
LDL Cholesterol: 117 mg/dL — ABNORMAL HIGH (ref 0–99)
NonHDL: 135.94
Total CHOL/HDL Ratio: 4
Triglycerides: 94 mg/dL (ref 0.0–149.0)
VLDL: 18.8 mg/dL (ref 0.0–40.0)

## 2020-09-18 LAB — VITAMIN B12: Vitamin B-12: 712 pg/mL (ref 211–911)

## 2020-09-18 LAB — TSH: TSH: 5.14 u[IU]/mL — ABNORMAL HIGH (ref 0.35–4.50)

## 2020-09-18 LAB — CBC WITH DIFFERENTIAL/PLATELET
Basophils Absolute: 0.2 10*3/uL — ABNORMAL HIGH (ref 0.0–0.1)
Basophils Relative: 3.4 % — ABNORMAL HIGH (ref 0.0–3.0)
Eosinophils Absolute: 0.3 10*3/uL (ref 0.0–0.7)
Eosinophils Relative: 5.1 % — ABNORMAL HIGH (ref 0.0–5.0)
HCT: 42.8 % (ref 36.0–46.0)
Hemoglobin: 14.3 g/dL (ref 12.0–15.0)
Lymphocytes Relative: 30.1 % (ref 12.0–46.0)
Lymphs Abs: 1.9 10*3/uL (ref 0.7–4.0)
MCHC: 33.5 g/dL (ref 30.0–36.0)
MCV: 94.9 fl (ref 78.0–100.0)
Monocytes Absolute: 0.4 10*3/uL (ref 0.1–1.0)
Monocytes Relative: 7 % (ref 3.0–12.0)
Neutro Abs: 3.4 10*3/uL (ref 1.4–7.7)
Neutrophils Relative %: 54.4 % (ref 43.0–77.0)
Platelets: 219 10*3/uL (ref 150.0–400.0)
RBC: 4.5 Mil/uL (ref 3.87–5.11)
RDW: 13.8 % (ref 11.5–15.5)
WBC: 6.2 10*3/uL (ref 4.0–10.5)

## 2020-09-18 LAB — HEMOGLOBIN A1C: Hgb A1c MFr Bld: 6.5 % (ref 4.6–6.5)

## 2020-09-21 ENCOUNTER — Other Ambulatory Visit: Payer: Self-pay | Admitting: Family Medicine

## 2020-09-21 ENCOUNTER — Telehealth: Payer: Self-pay

## 2020-09-21 NOTE — Telephone Encounter (Signed)
Pt notified of Dr. Tower's comments  

## 2020-09-21 NOTE — Telephone Encounter (Signed)
Looks like B12 level was nl on recent draw Lab Results  Component Value Date   VITAMINB12 712 09/18/2020

## 2020-09-21 NOTE — Telephone Encounter (Signed)
Patient is returning a call about rescheduling an appointment, did reschedule with patient but she very concerned about her B12 levels and is wondering if she should follow up before her physical.

## 2020-09-24 ENCOUNTER — Ambulatory Visit: Payer: HMO

## 2020-09-25 ENCOUNTER — Encounter: Payer: HMO | Admitting: Family Medicine

## 2020-10-05 DIAGNOSIS — H02102 Unspecified ectropion of right lower eyelid: Secondary | ICD-10-CM | POA: Diagnosis not present

## 2020-10-05 DIAGNOSIS — Z880 Allergy status to penicillin: Secondary | ICD-10-CM | POA: Diagnosis not present

## 2020-10-05 DIAGNOSIS — H02103 Unspecified ectropion of right eye, unspecified eyelid: Secondary | ICD-10-CM | POA: Diagnosis not present

## 2020-10-05 DIAGNOSIS — H02106 Unspecified ectropion of left eye, unspecified eyelid: Secondary | ICD-10-CM | POA: Diagnosis not present

## 2020-10-05 DIAGNOSIS — E119 Type 2 diabetes mellitus without complications: Secondary | ICD-10-CM | POA: Diagnosis not present

## 2020-10-05 DIAGNOSIS — H04563 Stenosis of bilateral lacrimal punctum: Secondary | ICD-10-CM | POA: Diagnosis not present

## 2020-10-05 DIAGNOSIS — Z885 Allergy status to narcotic agent status: Secondary | ICD-10-CM | POA: Diagnosis not present

## 2020-10-05 DIAGNOSIS — I2510 Atherosclerotic heart disease of native coronary artery without angina pectoris: Secondary | ICD-10-CM | POA: Diagnosis not present

## 2020-10-05 DIAGNOSIS — I10 Essential (primary) hypertension: Secondary | ICD-10-CM | POA: Diagnosis not present

## 2020-10-05 DIAGNOSIS — Z951 Presence of aortocoronary bypass graft: Secondary | ICD-10-CM | POA: Diagnosis not present

## 2020-10-05 DIAGNOSIS — Z7984 Long term (current) use of oral hypoglycemic drugs: Secondary | ICD-10-CM | POA: Diagnosis not present

## 2020-10-05 DIAGNOSIS — I499 Cardiac arrhythmia, unspecified: Secondary | ICD-10-CM | POA: Diagnosis not present

## 2020-10-05 DIAGNOSIS — H02132 Senile ectropion of right lower eyelid: Secondary | ICD-10-CM | POA: Diagnosis not present

## 2020-10-05 DIAGNOSIS — H04213 Epiphora due to excess lacrimation, bilateral lacrimal glands: Secondary | ICD-10-CM | POA: Diagnosis not present

## 2020-10-05 DIAGNOSIS — Z886 Allergy status to analgesic agent status: Secondary | ICD-10-CM | POA: Diagnosis not present

## 2020-10-05 DIAGNOSIS — H02135 Senile ectropion of left lower eyelid: Secondary | ICD-10-CM | POA: Diagnosis not present

## 2020-10-05 DIAGNOSIS — H02105 Unspecified ectropion of left lower eyelid: Secondary | ICD-10-CM | POA: Diagnosis not present

## 2020-10-05 DIAGNOSIS — Z7901 Long term (current) use of anticoagulants: Secondary | ICD-10-CM | POA: Diagnosis not present

## 2020-10-05 DIAGNOSIS — H04223 Epiphora due to insufficient drainage, bilateral lacrimal glands: Secondary | ICD-10-CM | POA: Diagnosis not present

## 2020-10-18 DIAGNOSIS — C44622 Squamous cell carcinoma of skin of right upper limb, including shoulder: Secondary | ICD-10-CM | POA: Diagnosis not present

## 2020-10-18 DIAGNOSIS — D225 Melanocytic nevi of trunk: Secondary | ICD-10-CM | POA: Diagnosis not present

## 2020-10-18 DIAGNOSIS — L821 Other seborrheic keratosis: Secondary | ICD-10-CM | POA: Diagnosis not present

## 2020-10-18 DIAGNOSIS — D0462 Carcinoma in situ of skin of left upper limb, including shoulder: Secondary | ICD-10-CM | POA: Diagnosis not present

## 2020-10-18 DIAGNOSIS — D2262 Melanocytic nevi of left upper limb, including shoulder: Secondary | ICD-10-CM | POA: Diagnosis not present

## 2020-10-18 DIAGNOSIS — D485 Neoplasm of uncertain behavior of skin: Secondary | ICD-10-CM | POA: Diagnosis not present

## 2020-10-18 DIAGNOSIS — D2272 Melanocytic nevi of left lower limb, including hip: Secondary | ICD-10-CM | POA: Diagnosis not present

## 2020-10-18 DIAGNOSIS — L570 Actinic keratosis: Secondary | ICD-10-CM | POA: Diagnosis not present

## 2020-10-18 DIAGNOSIS — D2261 Melanocytic nevi of right upper limb, including shoulder: Secondary | ICD-10-CM | POA: Diagnosis not present

## 2020-10-18 DIAGNOSIS — X32XXXA Exposure to sunlight, initial encounter: Secondary | ICD-10-CM | POA: Diagnosis not present

## 2020-10-18 DIAGNOSIS — D2271 Melanocytic nevi of right lower limb, including hip: Secondary | ICD-10-CM | POA: Diagnosis not present

## 2020-11-01 ENCOUNTER — Encounter: Payer: HMO | Admitting: Family Medicine

## 2020-11-05 ENCOUNTER — Ambulatory Visit (INDEPENDENT_AMBULATORY_CARE_PROVIDER_SITE_OTHER): Payer: HMO | Admitting: Family Medicine

## 2020-11-05 ENCOUNTER — Encounter: Payer: Self-pay | Admitting: Family Medicine

## 2020-11-05 ENCOUNTER — Other Ambulatory Visit: Payer: Self-pay

## 2020-11-05 VITALS — BP 116/92 | HR 61 | Temp 96.1°F | Ht 67.5 in | Wt 192.0 lb

## 2020-11-05 DIAGNOSIS — E1151 Type 2 diabetes mellitus with diabetic peripheral angiopathy without gangrene: Secondary | ICD-10-CM

## 2020-11-05 DIAGNOSIS — Z0000 Encounter for general adult medical examination without abnormal findings: Secondary | ICD-10-CM

## 2020-11-05 DIAGNOSIS — E538 Deficiency of other specified B group vitamins: Secondary | ICD-10-CM

## 2020-11-05 DIAGNOSIS — E663 Overweight: Secondary | ICD-10-CM

## 2020-11-05 DIAGNOSIS — R7989 Other specified abnormal findings of blood chemistry: Secondary | ICD-10-CM | POA: Diagnosis not present

## 2020-11-05 DIAGNOSIS — E1169 Type 2 diabetes mellitus with other specified complication: Secondary | ICD-10-CM

## 2020-11-05 DIAGNOSIS — E785 Hyperlipidemia, unspecified: Secondary | ICD-10-CM | POA: Diagnosis not present

## 2020-11-05 DIAGNOSIS — I10 Essential (primary) hypertension: Secondary | ICD-10-CM

## 2020-11-05 NOTE — Assessment & Plan Note (Signed)
Lab Results  Component Value Date   HGBA1C 6.5 09/18/2020   Very well controlled with diet and medications Metformin xr 500 mg daily  Glipizide xl 10 mg daily  Taking arb Statin allergic-cannot have  Eye exam utd Nl foot exam  F/u 70mo

## 2020-11-05 NOTE — Patient Instructions (Addendum)
Please work on your advance directive and get [place] a copy   I want to re check thyroid test in a month You may develop hypothyroidism in the future and we will watch it   Continue to avoid the red meat because it is bad for cholesterol anyway

## 2020-11-05 NOTE — Assessment & Plan Note (Signed)
Disc goals for lipids and reasons to control them Rev last labs with pt Rev low sat fat diet in detail Statin allergic/cannot have  LDL is 117 with better HDL this check  Will continue to avoid red meat and high sat/trans fat foods

## 2020-11-05 NOTE — Assessment & Plan Note (Signed)
New mild TSH elevation Lab Results  Component Value Date   TSH 5.14 (H) 09/18/2020    No new symptoms  Will plan to re check in a month with FT4

## 2020-11-05 NOTE — Assessment & Plan Note (Signed)
Lab Results  Component Value Date   [id number] 449 09/18/2020   Plan to continue current supplementation

## 2020-11-05 NOTE — Assessment & Plan Note (Signed)
Encouraged healthy diet and exercise

## 2020-11-05 NOTE — Assessment & Plan Note (Signed)
Reviewed health habits including diet and exercise and skin cancer prevention Reviewed appropriate screening tests for age  Also reviewed health mt list, fam hx and immunization status , as well as social and family history   See HPI Labs reviewed  Declines further colon cancer screening  Mammogram utd and due in august-pt aware  covid vaccinated Declines shingrix  Nl dexa in 2013 with no falls or fractures, taking vitamin D Given materials to work on advance directive No cognitive concerns Hearing screen addressed/no complaints with hearing or desire to work up further  utd eye/vision care   

## 2020-11-05 NOTE — Assessment & Plan Note (Signed)
Reviewed health habits including diet and exercise and skin cancer prevention Reviewed appropriate screening tests for age  Also reviewed health mt list, fam hx and immunization status , as well as social and family history   See HPI Labs reviewed  Declines further colon cancer screening  Mammogram utd and due in august-pt aware  covid vaccinated Declines shingrix  Nl dexa in 2013 with no falls or fractures, taking vitamin D Given materials to work on advance directive No cognitive concerns Hearing screen addressed/no complaints with hearing or desire to work up further  utd eye/vision care

## 2020-11-05 NOTE — Progress Notes (Signed)
Subjective:    Patient ID: Margaret Vazquez, female    DOB: [birth year], 85 y.o.   MRN: [id number]  This visit occurred during the SARS-CoV-2 public health emergency.  Safety protocols were in place, including screening questions prior to the visit, additional usage of staff PPE, and extensive cleaning of exam room while observing appropriate contact time as indicated for disinfecting solutions.    HPI Pt presents for amw and health mt exam   I have personally reviewed the Medicare Annual Wellness questionnaire and have noted 1. The patient's medical and social history 2. Their use of alcohol, tobacco or illicit drugs 3. Their current medications and supplements 4. The patient's functional ability including ADL's, fall risks, home safety risks and hearing or visual             impairment. 5. Diet and physical activities 6. Evidence for depression or mood disorders  The patients weight, height, BMI have been recorded in the chart and visual acuity is per eye clinic.  I have made referrals, counseling and provided education to the patient based review of the above and I have provided the pt with a written personalized care plan for preventive services. Reviewed and updated provider list, see scanned forms.  See scanned forms.  Routine anticipatory guidance given to patient.  See health maintenance. Colon cancer screening  Colonoscopy 913 , cologuard neg 3/19 , does not want to do more screening  Breast cancer screening  Mammogram 8/21 Self breast exam-no lumps  Sister had breast cancer  Flu vaccine 9/21 covid-vaccinated Tetanus vaccine  4/13 Td Pneumovax complete Zoster vaccine-declines shingrix  Dexa  5/13 ,normal BMD Falls-none  Fractures-none Supplements-vitamin D Exercise - cares for her husband  Walking hurts her L knee  / also heart issues   Advance directive-working on with a nurse p in her family  Cognitive function addressed- see scanned forms- and if abnormal then  additional documentation follows.   No problems/ good  Takes care of everything at home  Cares for husband Very mentally sharp    PMH and SH reviewed  Meds, vitals, and allergies reviewed.   ROS: See HPI.  Otherwise negative.    Weight : Wt Readings from Last 3 Encounters:  11/05/20 192 lb (87.1 kg)  07/26/20 189 lb (85.7 kg)  06/07/20 189 lb (85.7 kg)   29.63 kg/m   Hearing/vision:  Hearing Screening   _0  _1  _2  _3  _4  _5  _6  _7  _8   Right ear:   40 0 0  0    Left ear:   0 40 40  0    Vision Screening Comments: Pt had eye exam with Dr. Mordecai Rasmussen at [facility]. In March 2022 no trouble with hearing at home -no problems  Does not want further eval yet   Care team Delbra Zellars-pcp [facility]-cardiology Das[place]- derm [place]-ophy   HTN bp is stable today  No cp or palpitations or headaches or edema  No side effects to medicines  BP Readings from Last 3 Encounters:  11/05/20 (!) 116/92  07/26/20 138/60  06/07/20 (!) 149/65     A fib- taking eliquis imdur 60 mg daily  Losartan hct 50-12.5 mg 1/2 tab daily  inderal 10 mg -prn  Pulse Readings from Last 3 Encounters:  11/05/20 61  07/26/20 62  06/07/20 (!) 53     DM2 with vasc complications Lab Results  Component Value Date   HGBA1C 6.5 09/18/2020  down from 6.6  Metformin xr 500 mg daily  Glipizide xl 10 mg daily  Glucose around 100 in am  Taking arb  Statin allergic   Watches diet fairly carefully   Hyperlipidemia Lab Results  Component Value Date   CHOL 187 09/18/2020   CHOL 190 03/22/2020   CHOL 185 09/14/2019   Lab Results  Component Value Date   HDL 51.00 09/18/2020   HDL 41.20 03/22/2020   HDL 47.60 09/14/2019   Lab Results  Component Value Date   LDLCALC 117 (H) 09/18/2020   LDLCALC 109 (H) 03/22/2020   LDLCALC 114 (H) 09/14/2019   Lab Results  Component Value Date   TRIG 94.0 09/18/2020   TRIG 197.0 (H) 03/22/2020   TRIG 116.0 09/14/2019    Lab Results  Component Value Date   CHOLHDL 4 09/18/2020   CHOLHDL 5 03/22/2020   CHOLHDL 4 09/14/2019   Lab Results  Component Value Date   LDLDIRECT 107.0 08/27/2016   LDLDIRECT 136.5 10/20/2011   LDLDIRECT 134.7 08/08/2008  statin allergic  Cardiology aware    Is allergic to red meat  Wants to see an allergist eventually  No red meat in 2 y  Had alpha gal    Vit B12 def Lab Results  Component Value Date   LTJQZESP23 300 09/18/2020   Other labs Lab Results  Component Value Date   CREATININE 0.90 09/18/2020   BUN 18 09/18/2020   NA 142 09/18/2020   K 4.2 09/18/2020   CL 105 09/18/2020   CO2 30 09/18/2020   Lab Results  Component Value Date   ALT 11 09/18/2020   AST 13 09/18/2020   ALKPHOS 66 09/18/2020   BILITOT 0.6 09/18/2020   Lab Results  Component Value Date   TSH 5.14 (H) 09/18/2020    Patient Active Problem List   Diagnosis Date Noted  . Elevated TSH 11/05/2020  . Allergy to statin medication 03/26/2020  . History of loop recorder 09/06/2019  . History of seizure 03/23/2019  . Breast lump on left side at 10 o'clock position 11/17/2018  . Left-sided epistaxis 11/17/2018  . Medicare annual wellness visit, subsequent 09/13/2018  . Screening mammogram, encounter for 09/13/2018  . Herpes zoster 08/10/2018  . Plantar wart, left foot 06/15/2018  . Coronary artery disease 12/24/2017  . Routine general medical examination at a health care facility 08/24/2016  . PAF (paroxysmal atrial fibrillation) ([place]) 04/07/2016  . Overweight (BMI 25.0-29.9) 08/23/2014  . Type 2 diabetes mellitus with diabetic peripheral angiopathy without gangrene, without long-term current use of insulin ([place]) 01/12/2014  . TIA (transient ischemic attack) 01/11/2014  . History of CVA (cerebrovascular accident) 01/11/2014  . LOOP Recorder LINQ 06/07/2013  . Sinusitis, chronic 05/16/2013  . H/O: CVA (cerebrovascular accident) 02/15/2013  . Encounter for Medicare annual  wellness exam 10/27/2012  . Encopresis(307.7) 12/15/2011  . Post-menopausal 10/28/2011  . PERSONAL HX COLONIC POLYPS 09/04/2009  . B12 deficiency 03/06/2009  . ALLERGIC RHINITIS 02/14/2008  . BACK PAIN, LUMBAR 11/03/2007  . Hyperlipidemia associated with type 2 diabetes mellitus ([place]) 11/24/2006  . Essential hypertension 11/24/2006  . GERD 10/09/2006  . OVERACTIVE BLADDER 10/09/2006  . INCONTINENCE, URGE 10/09/2006  . SKIN CANCER, HX OF 10/09/2006   Past Medical History:  Diagnosis Date  . Allergy history, drug    Aspirin  . Basal cell carcinoma    "back"  . CAD (coronary artery disease)    a. Previously nonobstructive then progressive angina with abnl CT -> cardiac cath 12/24/17 showed 30% mid RCA and 80% prox-mid Cx. She received  DES to mid AV groove Cx. EF 55-65%.   . Cervical stenosis of spine    With neck pain  . CKD (chronic kidney disease), stage II   . Colon polyps 2009  . Complication of anesthesia 1980s   slow to wake after anesthesia "when I had breast biopsy"  . GERD (gastroesophageal reflux disease)   . Hyperlipidemia    myalgias with Lipitor and Zetia  . Hypertension   . Migraine    "stopped at age 69" (12/24/2017)  . Squamous carcinoma    "iced off and cut off; mostly arms" (12/24/2017)  . Stroke [facility])    "told me I'd had 2 strokes in 02/2013"; denies residual on 12/24/2017  . TIA (transient ischemic attack)   . Type II diabetes mellitus ([place])    Past Surgical History:  Procedure Laterality Date  . ABD [place]  07/2003   Negative  . APPENDECTOMY    . BASAL CELL CARCINOMA EXCISION     "back"  . BREAST BIOPSY Left 1992   benign  . CARDIAC CATHETERIZATION  04/07/2011   non obst CAD (Dr Burt Knack)  . CATARACT EXTRACTION W/PHACO Left 04/01/2017   Procedure: CATARACT EXTRACTION PHACO AND INTRAOCULAR LENS PLACEMENT ([place]) LEFT DIABETIC;  Surgeon: Leandrew Koyanagi, MD;  Location: [facility];  Service: Ophthalmology;  Laterality: Left;  Diabetic - oral meds  .  CATARACT EXTRACTION W/PHACO Right 04/29/2017   Procedure: CATARACT EXTRACTION PHACO AND INTRAOCULAR LENS PLACEMENT ([place]) RIGHT DIABETIC;  Surgeon: Leandrew Koyanagi, MD;  Location: [facility];  Service: Ophthalmology;  Laterality: Right;  Diabetic - oral meds  . COLONOSCOPY  12/2007   Adenomatous colon polyps  . CORONARY ANGIOPLASTY WITH STENT PLACEMENT  12/24/2017  . CORONARY STENT INTERVENTION N/A 12/24/2017   Procedure: CORONARY STENT INTERVENTION;  Surgeon: Lorretta Harp, MD;  Location: [facility] CV LAB;  Service: Cardiovascular;  Laterality: N/A;  . CYSTOSCOPY W/ DECANNULATION  03/2000   Normal  . LEFT HEART CATH AND CORONARY ANGIOGRAPHY N/A 12/24/2017   Procedure: LEFT HEART CATH AND CORONARY ANGIOGRAPHY;  Surgeon: Lorretta Harp, MD;  Location: [facility] CV LAB;  Service: Cardiovascular;  Laterality: N/A;  . LOOP RECORDER IMPLANT N/A 02/16/2013   Procedure: LOOP RECORDER IMPLANT;  Surgeon: Deboraha Sprang, MD;  Location: [facility] CATH LAB;  Service: Cardiovascular;  Laterality: N/A;  . MOHS SURGERY     right hand   . NASAL SINUS SURGERY  01/2005  . SQUAMOUS CELL CARCINOMA EXCISION     "mostly arms;" (12/24/2017)  . STRESS CARDIOLITE  11/1999   Normal/ negative  . TEAR DUCT PROBING  2005   "? side"  . TEE WITHOUT CARDIOVERSION N/A 02/16/2013   Procedure: TRANSESOPHAGEAL ECHOCARDIOGRAM (TEE);  Surgeon: Josue Hector, MD;  Location: [facility] ENDOSCOPY;  Service: Cardiovascular;  Laterality: N/A;  . TUBAL LIGATION     BTL   Social History   Tobacco Use  . Smoking status: Never Smoker  . Smokeless tobacco: Never Used  Vaping Use  . Vaping Use: Never used  Substance Use Topics  . Alcohol use: Never    Alcohol/week: 0.0 standard drinks  . Drug use: Never   Family History  Problem Relation Age of Onset  . Lung cancer Brother   . Diabetes Brother   . Pancreatic cancer Brother   . Heart disease Mother   . Heart disease Father   . Brain cancer Other   . Skin cancer  Daughter   . Diabetes Sister   .  Breast cancer Sister   . Colon cancer Neg Hx    Allergies  Allergen Reactions  . Bee Venom Hives, Shortness Of Breath and Swelling  . Nabumetone Anaphylaxis  . Amoxicillin-Pot Clavulanate Hives and Swelling    To lips.  . Aspirin Hives  . Atorvastatin Swelling     joint pain/swelling, inc liver tests  . Clopidogrel Bisulfate Hives  . Codeine Nausea And Vomiting  . Ezetimibe Other (See Comments)     fatigue  . Metformin And Related Other (See Comments)    Diarrhea   . Valsartan Other (See Comments)     fatigue  . Other Hives    **Red Meat**  SOB   Current Outpatient Medications on File Prior to Visit  Medication Sig Dispense Refill  . apixaban (ELIQUIS) 5 MG TABS tablet Take 1 tablet (5 mg total) by mouth 2 (two) times daily. 60 tablet 11  . Blood Glucose Monitoring Suppl (ONE TOUCH ULTRA 2) w/Device KIT Use to check blood sugar once daily 1 each 0  . diphenhydrAMINE (BENADRYL) 25 MG tablet Take 50 mg by mouth every 8 (eight) hours as needed for allergies.     Marland Kitchen EPINEPHrine (EPI-PEN) 0.3 mg/0.3 mL DEVI Inject 0.3 mg into the muscle daily as needed (allergic reaction).     . fluticasone (FLONASE) 50 MCG/ACT nasal spray Place 2 sprays into both nostrils 2 (two) times daily as needed. Reported on 09/10/2015    . glipiZIDE (GLUCOTROL XL) 10 MG 24 hr tablet Take 1 tablet (10 mg total) by mouth daily with breakfast. 90 tablet 3  . glucose blood (ONETOUCH ULTRA) test strip USE TO CHECK BLOOD SUGAR ONCE DAILY (DX CODE: E11.51) 100 each 1  . isosorbide mononitrate (IMDUR) 60 MG 24 hr tablet TAKE 1 TABLET(60 MG) BY MOUTH DAILY 90 tablet 3  . losartan-hydrochlorothiazide (HYZAAR) 50-12.5 MG tablet Take 0.5 tablet (25-6.25 mg) by mouth once daily 45 tablet 3  . metFORMIN (GLUCOPHAGE-XR) 500 MG 24 hr tablet TAKE 1 TABLET(500 MG) BY MOUTH DAILY WITH BREAKFAST 90 tablet 0  . nitroGLYCERIN (NITROSTAT) 0.4 MG SL tablet Place 1 tablet (0.4 mg total) under the tongue  every 5 (five) minutes as needed for chest pain (up to 3 doses. If taking 3rd dose call 911). 25 tablet 3  . potassium chloride SA (KLOR-CON) 20 MEQ tablet Take 1 tablet (20 mEq total) by mouth daily. 90 tablet 3  . propranolol (INDERAL) 10 MG tablet Take 1 tablet (10 mg) by mouth every 1 hour x 3 doses as needed for chest pain/ fast heart rates 30 tablet 1   No current facility-administered medications on file prior to visit.    Review of Systems  Constitutional: Negative for activity change, appetite change, fatigue, fever and unexpected weight change.  HENT: Negative for congestion, ear pain, rhinorrhea, sinus pressure and sore throat.   Eyes: Negative for pain, redness and visual disturbance.  Respiratory: Negative for cough, shortness of breath and wheezing.   Cardiovascular: Negative for chest pain and palpitations.  Gastrointestinal: Negative for abdominal pain, blood in stool, constipation and diarrhea.  Endocrine: Negative for polydipsia and polyuria.  Genitourinary: Negative for dysuria, frequency and urgency.  Musculoskeletal: Positive for arthralgias and back pain. Negative for myalgias.  Skin: Negative for pallor and rash.  Allergic/Immunologic: Negative for environmental allergies.  Neurological: Negative for dizziness, syncope and headaches.  Hematological: Negative for adenopathy. Does not bruise/bleed easily.  Psychiatric/Behavioral: Negative for decreased concentration and dysphoric mood. The patient is not nervous/anxious.  Stressors noted       Objective:   Physical Exam Constitutional:      General: She is not in acute distress.    Appearance: Normal appearance. She is well-developed. She is obese. She is not ill-appearing or diaphoretic.  HENT:     Head: Normocephalic and atraumatic.     Right Ear: Tympanic membrane, ear canal and external ear normal.     Left Ear: Tympanic membrane, ear canal and external ear normal.     Nose: Nose normal. No congestion.      Mouth/Throat:     Mouth: Mucous membranes are moist.     Pharynx: Oropharynx is clear. No posterior oropharyngeal erythema.  Eyes:     General: No scleral icterus.    Extraocular Movements: Extraocular movements intact.     Conjunctiva/sclera: Conjunctivae normal.     Pupils: Pupils are equal, round, and reactive to light.  Neck:     Thyroid: No thyromegaly.     Vascular: No carotid bruit or JVD.  Cardiovascular:     Rate and Rhythm: Normal rate and regular rhythm.     Pulses: Normal pulses.     Heart sounds: Normal heart sounds. No gallop.   Pulmonary:     Effort: Pulmonary effort is normal. No respiratory distress.     Breath sounds: Normal breath sounds. No wheezing.     Comments: Good air exch Chest:     Chest wall: No tenderness.  Abdominal:     General: Bowel sounds are normal. There is no distension or abdominal bruit.     Palpations: Abdomen is soft. There is no mass.     Tenderness: There is no abdominal tenderness.     Hernia: No hernia is present.  Genitourinary:    Comments: Breast exam: No mass, nodules, thickening, tenderness, bulging, retraction, inflamation, nipple discharge or skin changes noted.  No axillary or clavicular LA.     Musculoskeletal:        General: No tenderness. Normal range of motion.     Cervical back: Normal range of motion and neck supple. No rigidity. No muscular tenderness.     Right lower leg: No edema.     Left lower leg: No edema.     Comments: No kyphosis   Lymphadenopathy:     Cervical: No cervical adenopathy.  Skin:    General: Skin is warm and dry.     Coloration: Skin is not pale.     Findings: No erythema or rash.     Comments: Solar lentigines diffusely sks also  Neurological:     Mental Status: She is alert. Mental status is at baseline.     Cranial Nerves: No cranial nerve deficit.     Motor: No abnormal muscle tone.     Coordination: Coordination normal.     Gait: Gait normal.     Deep Tendon Reflexes: Reflexes  are normal and symmetric. Reflexes normal.  Psychiatric:        Mood and Affect: Mood normal.        Cognition and Memory: Cognition and memory normal.     Comments: Mentally sharp           Assessment & Plan:   Problem List Items Addressed This Visit      Cardiovascular and Mediastinum   Essential hypertension    In the setting of a fib and CAD bp in fair control at this time  BP Readings from Last 1 Encounters:  11/05/20 (!) 116/92  No changes needed Most recent labs reviewed  Disc lifstyle change with low sodium diet and exercise   Plan to continue imdur 60 mg daily and losartan hct 50-12.5 daily       Type 2 diabetes mellitus with diabetic peripheral angiopathy without gangrene, without long-term current use of insulin (HCC)    Lab Results  Component Value Date   HGBA1C 6.5 09/18/2020   Very well controlled with diet and medications Metformin xr 500 mg daily  Glipizide xl 10 mg daily  Taking arb Statin allergic-cannot have  Eye exam utd Nl foot exam  F/u 5mo       Endocrine   Hyperlipidemia [place] type 2 diabetes mellitus [place]    Disc goals for lipids and reasons to control them Rev last labs with pt Rev low sat fat diet in detail Statin allergic/cannot have  LDL is 117 with better HDL this check  Will continue to avoid red meat and high sat/trans fat foods         Other   B12 deficiency    Lab Results  Component Value Date   VITAMINB12 712 09/18/2020   Plan to continue current supplementation        Encounter for Medicare annual wellness exam - Primary    Reviewed health habits including diet and exercise and skin cancer prevention Reviewed appropriate screening tests for age  Also reviewed health mt list, fam hx and immunization status , as well as social and family history   See HPI Labs reviewed  Declines further colon cancer screening  Mammogram utd and due in august-pt aware  covid vaccinated Declines shingrix  Nl dexa  in 2013 with no falls or fractures, taking vitamin D Given materials to work on advance directive No cognitive concerns Hearing screen addressed/no complaints with hearing or desire to work up further  utd eye/vision care        Overweight (BMI 25.0-29.9)    Encouraged healthy diet and exercise       Routine general medical examination at a health care facility    Reviewed health habits including diet and exercise and skin cancer prevention Reviewed appropriate screening tests for age  Also reviewed health mt list, fam hx and immunization status , as well as social and family history   See HPI Labs reviewed  Declines further colon cancer screening  Mammogram utd and due in august-pt aware  covid vaccinated Declines shingrix  Nl dexa in 2013 with no falls or fractures, taking vitamin D Given materials to work on advance directive No cognitive concerns Hearing screen addressed/no complaints with hearing or desire to work up further  utd eye/vision care      Elevated TSH    New mild TSH elevation Lab Results  Component Value Date   TSH 5.14 (H) 09/18/2020    No new symptoms  Will plan to re check in a month with FT4

## 2020-11-05 NOTE — Assessment & Plan Note (Signed)
In the setting of a fib and CAD bp in fair control at this time  BP Readings from Last 1 Encounters:  11/05/20 (!) 116/92   No changes needed Most recent labs reviewed  Disc lifstyle change with low sodium diet and exercise   Plan to continue imdur 60 mg daily and losartan hct 50-12.5 daily

## 2020-11-28 DIAGNOSIS — C44622 Squamous cell carcinoma of skin of right upper limb, including shoulder: Secondary | ICD-10-CM | POA: Diagnosis not present

## 2020-11-29 ENCOUNTER — Ambulatory Visit: Payer: HMO | Admitting: Internal Medicine

## 2020-11-29 ENCOUNTER — Encounter: Payer: Self-pay | Admitting: Internal Medicine

## 2020-11-29 ENCOUNTER — Other Ambulatory Visit: Payer: Self-pay

## 2020-11-29 VITALS — BP 130/68 | HR 56 | Ht 67.5 in | Wt 191.0 lb

## 2020-11-29 DIAGNOSIS — I2510 Atherosclerotic heart disease of native coronary artery without angina pectoris: Secondary | ICD-10-CM | POA: Diagnosis not present

## 2020-11-29 DIAGNOSIS — I4589 Other specified conduction disorders: Secondary | ICD-10-CM

## 2020-11-29 DIAGNOSIS — R001 Bradycardia, unspecified: Secondary | ICD-10-CM | POA: Diagnosis not present

## 2020-11-29 DIAGNOSIS — Z9889 Other specified postprocedural states: Secondary | ICD-10-CM | POA: Diagnosis not present

## 2020-11-29 DIAGNOSIS — I639 Cerebral infarction, unspecified: Secondary | ICD-10-CM | POA: Diagnosis not present

## 2020-11-29 DIAGNOSIS — I480 Paroxysmal atrial fibrillation: Secondary | ICD-10-CM

## 2020-11-29 DIAGNOSIS — I10 Essential (primary) hypertension: Secondary | ICD-10-CM | POA: Diagnosis not present

## 2020-11-29 DIAGNOSIS — E785 Hyperlipidemia, unspecified: Secondary | ICD-10-CM | POA: Diagnosis not present

## 2020-11-29 NOTE — Progress Notes (Signed)
Patient Care Team: Tower, [name], MD as PCP - General (Family Medicine) Deboraha Sprang, MD as PCP - Cardiology (Cardiology) Deboraha Sprang, MD as PCP - Electrophysiology (Cardiology) Dasher, Rayvon Char, MD as Consulting Physician (Dermatology) Salomon Fick., MD as Referring Physician (Dentistry) Leandrew Koyanagi, MD as Referring Physician (Ophthalmology)   HPI  Margaret Vazquez is a 85 y.o. female seen in follow-up for chronotropic incompetence and exercise intolerance.  Peak heart rate in the office was 73.  Has been deferring a discussion of pacing for almost a year  Initially followed  for a loop recorder implanted for cryptogenic stroke. She has significant hypertension which is much improved  Initially had an episode of atrial fibrillation in the context of treatment for anaphylaxis and epinephrine.  Subsequently presented with atrial fibrillation with a rapid rate and associated with chest pain.  2019 coronary artery disease-CT>> abnormal FFR; circumflex was stented     Currently taking Brilinta.  Is allergic to aspirin and Plavix.  She was transitioned to Eliquis without bleeding   She has Carotid disease but has been intolerant of statins and has been referred for PCSK9 therapy  Continues to struggle with exercise intolerance.  Shortness of breath and fatigue are much more limiting than her knee.  She remains reluctant to think about pacing well.  Some intermittent but not exertional chest pain.  DATE TEST EF   6/19 LHC 55-65% CXp/m-80>>stent RCAm 30%  10/21 Echo   65-70 %         Date Cr K TSH Hgb  9/21 0.98 3.9  15.0   3/22 0.9 4.2 5.14 22.2   Thromboembolic risk factors ( age  -2, HTN-1, TIA/CVA-2, Vasc disease -1, Gender-1) for a CHADSVASc Score of >=7      Past Medical History:  Diagnosis Date  . Allergy history, drug    Aspirin  . Basal cell carcinoma    "back"  . CAD (coronary artery disease)    a. Previously nonobstructive then  progressive angina with abnl CT -> cardiac cath 12/24/17 showed 30% mid RCA and 80% prox-mid Cx. She received DES to mid AV groove Cx. EF 55-65%.   . Cervical stenosis of spine    With neck pain  . CKD (chronic kidney disease), stage II   . Colon polyps 2009  . Complication of anesthesia 1980s   slow to wake after anesthesia "when I had breast biopsy"  . GERD (gastroesophageal reflux disease)   . Hyperlipidemia    myalgias with Lipitor and Zetia  . Hypertension   . Migraine    "stopped at age 84" (12/24/2017)  . Squamous carcinoma    "iced off and cut off; mostly arms" (12/24/2017)  . Stroke [facility])    "told me I'd had 2 strokes in 02/2013"; denies residual on 12/24/2017  . TIA (transient ischemic attack)   . Type II diabetes mellitus ([place])     Past Surgical History:  Procedure Laterality Date  . ABD [place]  07/2003   Negative  . APPENDECTOMY    . BASAL CELL CARCINOMA EXCISION     "back"  . BREAST BIOPSY Left 1992   benign  . CARDIAC CATHETERIZATION  04/07/2011   non obst CAD (Dr Burt Knack)  . CATARACT EXTRACTION W/PHACO Left 04/01/2017   Procedure: CATARACT EXTRACTION PHACO AND INTRAOCULAR LENS PLACEMENT ([place]) LEFT DIABETIC;  Surgeon: Leandrew Koyanagi, MD;  Location: [place];  Service: Ophthalmology;  Laterality: Left;  Diabetic -  oral meds  . CATARACT EXTRACTION W/PHACO Right 04/29/2017   Procedure: CATARACT EXTRACTION PHACO AND INTRAOCULAR LENS PLACEMENT ([place]) RIGHT DIABETIC;  Surgeon: Leandrew Koyanagi, MD;  Location: [place];  Service: Ophthalmology;  Laterality: Right;  Diabetic - oral meds  . COLONOSCOPY  12/2007   Adenomatous colon polyps  . CORONARY ANGIOPLASTY WITH STENT PLACEMENT  12/24/2017  . CORONARY STENT INTERVENTION N/A 12/24/2017   Procedure: CORONARY STENT INTERVENTION;  Surgeon: Lorretta Harp, MD;  Location: [place] CV LAB;  Service: Cardiovascular;  Laterality: N/A;  . CYSTOSCOPY W/ DECANNULATION  03/2000   Normal  . LEFT HEART  CATH AND CORONARY ANGIOGRAPHY N/A 12/24/2017   Procedure: LEFT HEART CATH AND CORONARY ANGIOGRAPHY;  Surgeon: Lorretta Harp, MD;  Location: [place] CV LAB;  Service: Cardiovascular;  Laterality: N/A;  . LOOP RECORDER IMPLANT N/A 02/16/2013   Procedure: LOOP RECORDER IMPLANT;  Surgeon: Deboraha Sprang, MD;  Location: [facility] CATH LAB;  Service: Cardiovascular;  Laterality: N/A;  . MOHS SURGERY     right hand   . NASAL SINUS SURGERY  01/2005  . SQUAMOUS CELL CARCINOMA EXCISION     "mostly arms;" (12/24/2017)  . STRESS CARDIOLITE  11/1999   Normal/ negative  . TEAR DUCT PROBING  2005   "? side"  . TEE WITHOUT CARDIOVERSION N/A 02/16/2013   Procedure: TRANSESOPHAGEAL ECHOCARDIOGRAM (TEE);  Surgeon: Josue Hector, MD;  Location: [facility] ENDOSCOPY;  Service: Cardiovascular;  Laterality: N/A;  . TUBAL LIGATION     BTL    Current Outpatient Medications  Medication Sig Dispense Refill  . apixaban (ELIQUIS) 5 MG TABS tablet Take 1 tablet (5 mg total) by mouth 2 (two) times daily. 60 tablet 11  . Blood Glucose Monitoring Suppl (ONE TOUCH ULTRA 2) w/Device KIT Use to check blood sugar once daily 1 each 0  . Cyanocobalamin (VITAMIN B-12 PO) Take 1 tablet by mouth daily.    . diphenhydrAMINE (BENADRYL) 25 MG tablet Take 50 mg by mouth every 8 (eight) hours as needed for allergies.     Marland Kitchen EPINEPHrine (EPI-PEN) 0.3 mg/0.3 mL DEVI Inject 0.3 mg into the muscle daily as needed (allergic reaction).     . fluticasone (FLONASE) 50 MCG/ACT nasal spray Place 2 sprays into both nostrils 2 (two) times daily as needed. Reported on 09/10/2015    . glipiZIDE (GLUCOTROL XL) 10 MG 24 hr tablet Take 1 tablet (10 mg total) by mouth daily with breakfast. 90 tablet 3  . glucose blood (ONETOUCH ULTRA) test strip USE TO CHECK BLOOD SUGAR ONCE DAILY (DX CODE: E11.51) 100 each 1  . isosorbide mononitrate (IMDUR) 60 MG 24 hr tablet TAKE 1 TABLET(60 MG) BY MOUTH DAILY 90 tablet 3  . losartan-hydrochlorothiazide (HYZAAR) 50-12.5 MG  tablet Take 0.5 tablet (25-6.25 mg) by mouth once daily 45 tablet 3  . metFORMIN (GLUCOPHAGE-XR) 500 MG 24 hr tablet TAKE 1 TABLET(500 MG) BY MOUTH DAILY WITH BREAKFAST 90 tablet 0  . nitroGLYCERIN (NITROSTAT) 0.4 MG SL tablet Place 1 tablet (0.4 mg total) under the tongue every 5 (five) minutes as needed for chest pain (up to 3 doses. If taking 3rd dose call 911). 25 tablet 3  . potassium chloride SA (KLOR-CON) 20 MEQ tablet Take 1 tablet (20 mEq total) by mouth daily. 90 tablet 3  . propranolol (INDERAL) 10 MG tablet Take 1 tablet (10 mg) by mouth every 1 hour x 3 doses as needed for chest pain/ fast heart rates 30 tablet 1  No current facility-administered medications for this visit.    Allergies  Allergen Reactions  . Bee Venom Hives, Shortness Of Breath and Swelling  . Nabumetone Anaphylaxis  . Amoxicillin-Pot Clavulanate Hives and Swelling    To lips.  . Aspirin Hives  . Atorvastatin Swelling     joint pain/swelling, inc liver tests  . Clopidogrel Bisulfate Hives  . Codeine Nausea And Vomiting  . Ezetimibe Other (See Comments)     fatigue  . Metformin And Related Other (See Comments)    Diarrhea   . Valsartan Other (See Comments)     fatigue  . Other Hives    **Red Meat**  SOB    Review of Systems negative except from HPI and PMH  Physical Exam BP 130/68 (BP Location: Left Arm, Patient Position: Sitting, Cuff Size: Normal)   Pulse (!) 56   Ht 5' 7.5" (1.715 m)   Wt 191 lb (86.6 kg)   SpO2 94%   BMI 29.47 kg/m  Well developed and nourished in no acute distress HENT normal Neck supple with JVP-  flat   Clear Regular rate and rhythm, no murmurs or gallops Abd-soft with active BS No Clubbing cyanosis edema Skin-warm and dry A & Oriented  Grossly normal sensory and motor function  ECG sinus at 62 Interval 17/08/40 Assessment and  Plan  Cryptogenic stroke  Implantable loop recorder end of service  Elevated blood pressure  Coronary disease s/p Cx  stenting  Dyspnea on exertion  Exertional chest pain consistent with angina-new  Atrial fibrillation paroxysmal   Sinus bradycardia w chronotropic incompetence    No significant atrial fibrillation On Anticoagulant for thromboembolic risk reduction.  No bleeding issues.  Continue Apixoban   at 62m; she will continue to use her propranolol 10 mg as needed  Dyspnea on exertion without volume overload.  Continue to suspect chronotropic incompetence.  As such, we will undertake formal treadmill testing a modified protocol.  Also we will continue her on the low-dose diuretic losartan/HCT 25/6.25  accompanied by potassium repletion at 20 mEq daily  The recurring of chest discomfort also raises the specter of progressive and recurrent coronary disease.  Following the above, if unenlightening, we will undertake Myoview scanning  Blood pressure is better.  We will continue her on losartan hydrochlorothiazide and her Imdur 60

## 2020-11-29 NOTE — Patient Instructions (Signed)
Medication Instructions:  - Your physician recommends that you continue on your current medications as directed. Please refer to the Current Medication list given to you today.  *If you need a refill on your cardiac medications before your next appointment, please call your pharmacy*   Lab Work: - none ordered  If you have labs (blood work) drawn today and your tests are completely normal, you will receive your results only by: Marland Kitchen MyChart Message (if you have MyChart) OR . A paper copy in the mail If you have any lab test that is abnormal or we need to change your treatment, we will call you to review the results.   Testing/Procedures: - Your physician has requested that you have an exercise tolerance test.  (Will need to be on a day Dr. Caryl Comes & Nira Conn are in the office at a 7:30 am, 8:00 am, 8:30 am slot depending on provider schedule)  - you may eat a light breakfast/ lunch prior to your procedure - no caffeine for 24 hours prior to your test (coffee, tea, soft drinks, or chocolate)  - no smoking/ vaping for 4 hours prior to your test - you may take your regular medications the day of your test - bring any inhalers with you to your test - wear comfortable clothing & tennis/ non-skid shoes to walk on the treadmill    Follow-Up: At [facility], you and your health needs are our priority.  As part of our continuing mission to provide you with exceptional heart care, we have created designated Provider Care Teams.  These Care Teams include your primary Cardiologist (physician) and Advanced Practice Providers (APPs -  Physician Assistants and Nurse Practitioners) who all work together to provide you with the care you need, when you need it.  We recommend signing up for the patient portal called "MyChart".  Sign up information is provided on this After Visit Summary.  MyChart is used to connect with patients for Virtual Visits (Telemedicine).  Patients are able to view lab/test  results, encounter notes, upcoming appointments, etc.  Non-urgent messages can be sent to your provider as well.   To learn more about what you can do with MyChart, go to [email or web address].    Your next appointment:   Pending the results of your treadmill test   The format for your next appointment:   In Person  Provider:   Virl Axe, MD   Other Instructions   Exercise Stress Test An exercise stress test is a test to check how your heart works during exercise. You will need to walk on a treadmill or ride an exercise bike for this test. An electrocardiogram (ECG) will record your heartbeat when you are at rest and when you are exercising. You may have an ultrasound or nuclear test after the exercise test. The test is done to check for coronary artery disease (CAD). It is also done to:  See how well you can exercise.  Watch for high blood pressure during exercise.  Test how well you can exercise after treatment.  Check the blood flow to your arms and legs. If your test result is not normal, more testing may be needed. What happens before the procedure?  Follow instructions from your doctor about what you cannot eat or drink. ? Do not have any drinks or foods that have caffeine in them for 24 hours before the test, or as told by your doctor. This includes coffee, tea (even decaf tea), sodas, chocolate, and cocoa.  Ask  your doctor about changing or stopping your normal medicines. This is important if you: ? Take diabetes medicines. ? Take beta-blocker medicines. ? Wear a nitroglycerin patch.  If you use an inhaler, bring it with you to the test.  Do not put lotions, powders, creams, or oils on your chest before the test.  Wear comfortable shoes and clothing.  Do not use any products that have nicotine or tobacco in them, such as cigarettes and e-cigarettes. Stop using them at least 4 hours before the test. If you need help quitting, ask your doctor. What happens  during the procedure?  Patches (electrodes) will be put on your chest.  Wires will be connected to the patches. The wires will send signals to a machine to record your heartbeat.  Your heart rate will be watched while you are resting and while you are exercising. Your blood pressure will also be watched during the test.  You will walk on a treadmill or use an exercise bike. If you cannot use these, you may be asked to turn a crank with your hands.  The activity will get harder and will raise your heart rate.  You may be asked to breathe into a tube a few times during the test. This measures the gases that you breathe out.  You will be asked how you are feeling throughout the test.  You will exercise until your heart reaches a target heart rate. You will stop early if: ? You feel dizzy. ? You have chest pain. ? You are out of breath. ? Your blood pressure is too high or too low. ? You have an irregular heartbeat. ? You have pain or aching in your arms or legs. The procedure may vary among doctors and hospitals.   What happens after the procedure?  Your blood pressure, heart rate, breathing rate, and blood oxygen level will be watched after the test.  You may return to your normal diet and activities as told by your doctor.  It is up to you to get the results of your test. Ask your doctor, or the department that is doing the test, when your results will be ready. Summary  An exercise stress test is a test to check how your heart works during exercise.  This test is done to check for coronary artery disease.  Your heart rate will be watched while you are resting and while you are exercising.  Follow instructions from your doctor about what you cannot eat or drink before the test. This information is not intended to replace advice given to you by your health care provider. Make sure you discuss any questions you have with your health care provider. Document Revised: 02/24/2020  Document Reviewed: 02/24/2020 Elsevier Patient Education  [place].

## 2020-12-05 ENCOUNTER — Telehealth: Payer: Self-pay | Admitting: Family Medicine

## 2020-12-05 DIAGNOSIS — R7989 Other specified abnormal findings of blood chemistry: Secondary | ICD-10-CM

## 2020-12-05 NOTE — Telephone Encounter (Signed)
-----   Message from Cloyd Stagers, RT sent at 11/19/2020  2:23 PM EDT ----- Regarding: Lab Orders for Thursday 6.2.2022 Please place lab orders for Thursday 6.2.2022, appt notes state "1 month thyroid" Thank you, Dyke Maes RT(R)

## 2020-12-06 ENCOUNTER — Other Ambulatory Visit: Payer: HMO

## 2020-12-12 DIAGNOSIS — D0462 Carcinoma in situ of skin of left upper limb, including shoulder: Secondary | ICD-10-CM | POA: Diagnosis not present

## 2020-12-17 ENCOUNTER — Other Ambulatory Visit: Payer: Self-pay | Admitting: Family Medicine

## 2020-12-20 ENCOUNTER — Other Ambulatory Visit: Payer: Self-pay

## 2020-12-20 ENCOUNTER — Other Ambulatory Visit (INDEPENDENT_AMBULATORY_CARE_PROVIDER_SITE_OTHER): Payer: HMO

## 2020-12-20 ENCOUNTER — Other Ambulatory Visit: Payer: Self-pay | Admitting: Family Medicine

## 2020-12-20 ENCOUNTER — Telehealth: Payer: Self-pay

## 2020-12-20 DIAGNOSIS — R7989 Other specified abnormal findings of blood chemistry: Secondary | ICD-10-CM | POA: Diagnosis not present

## 2020-12-20 LAB — TSH: TSH: 4.53 u[IU]/mL — ABNORMAL HIGH (ref 0.35–4.50)

## 2020-12-20 LAB — T4, FREE: Free T4: 0.77 ng/dL (ref 0.60–1.60)

## 2020-12-20 NOTE — Telephone Encounter (Signed)
Pt left v/m about lab testing requesting cb; pt got my chart notice. Sending note to [place].

## 2020-12-21 NOTE — Telephone Encounter (Signed)
Patient is returning call to [place] about results. Please advise. EM

## 2020-12-21 NOTE — Telephone Encounter (Signed)
Patient called and encounter completed in other encounter.

## 2021-01-01 DIAGNOSIS — B029 Zoster without complications: Secondary | ICD-10-CM | POA: Diagnosis not present

## 2021-01-04 ENCOUNTER — Telehealth: Payer: Self-pay | Admitting: *Deleted

## 2021-01-04 NOTE — Chronic Care Management (AMB) (Signed)
  Chronic Care Management   Outreach Note  01/04/2021 Name: Margaret Vazquez MRN: [id number] DOB: 1936-03-28  Margaret Vazquez is a 85 y.o. year old female who is a primary care patient of Tower, Margaret Fanny, MD. I reached out to Margaret Vazquez by phone today in response to a referral sent by Ms. Margaret Vazquez's PCP Tower, Margaret Fanny, MD     An unsuccessful telephone outreach was attempted today. The patient was referred to the case management team for assistance with care management and care coordination.   Follow Up Plan: A HIPAA compliant phone message was left for the patient providing contact information and requesting a return call.  If patient returns call to provider office, please advise to call Embedded Care Management Care Guide English Margaret Vazquez at [place], [place] Management  Direct Dial: [phone]

## 2021-01-09 NOTE — Chronic Care Management (AMB) (Signed)
  Chronic Care Management   Note  01/09/2021 Name: [name] MRN: [id number] DOB: Jul 27, 1935  Margaret Vazquez is a 85 y.o. year old female who is a primary care patient of Tower, [name], MD. I reached out to Zandra Abts by phone today in response to a referral sent by Ms. Verl Dicker Willison's PCP Tower, [name], MD     Ms. Voyles was given information about Chronic Care Management services today including:  CCM service includes personalized support from designated clinical staff supervised by her physician, including individualized plan of care and coordination with other care providers 24/7 contact phone numbers for assistance for urgent and routine care needs. Service will only be billed when office clinical staff spend 20 minutes or more in a month to coordinate care. Only one practitioner may furnish and bill the service in a calendar month. The patient may stop CCM services at any time (effective at the end of the month) by phone call to the office staff. The patient will be responsible for cost sharing (co-pay) of up to 20% of the service fee (after annual deductible is met).  Patient agreed to services and verbal consent obtained.   Follow up plan: Telephone appointment with care management team member scheduled for:01/21/2021  Julian Hy, [place] Management  Direct Dial: [phone]

## 2021-01-21 ENCOUNTER — Telehealth: Payer: Self-pay | Admitting: Internal Medicine

## 2021-01-21 ENCOUNTER — Telehealth: Payer: HMO

## 2021-01-21 ENCOUNTER — Telehealth: Payer: Self-pay

## 2021-01-21 NOTE — Telephone Encounter (Signed)
  Care Management   Follow Up Note   01/21/2021 Name: Margaret Vazquez MRN: [id number] DOB: Jan 27, 1936   Referred by: Tower, [name], MD Reason for referral : Chronic Care Management (Initial assessement)   An unsuccessful telephone outreach was attempted today. The patient was referred to the case management team for assistance with care management and care coordination.   Follow Up Plan: A HIPPA compliant phone message was left for the patient providing contact information and requesting a return call.  Quinn Plowman RN,BSN,CCM RN Case Manager [place]  [phone]

## 2021-01-21 NOTE — Telephone Encounter (Signed)
Patient made aware of pre-test instructions for her 01/22/21 gxt @ 8am.  - you may eat a light breakfast/ lunch prior to your procedure - no caffeine for 24 hours prior to your test (coffee, tea, soft drinks, or chocolate)  - no smoking/ vaping for 4 hours prior to your test - you may take your regular medications the day of your test except for: DO NOT take Propanolol for 24 hours prior - bring any inhalers with you to your test - wear comfortable clothing & tennis/ non-skid shoes to walk on the treadmill  Pt propanolol is prn. Patient sts that she has not had to use the medication.  Patient verbalized understanding to the instructions given above.

## 2021-01-22 ENCOUNTER — Other Ambulatory Visit: Payer: Self-pay

## 2021-01-22 ENCOUNTER — Ambulatory Visit: Payer: HMO | Admitting: Internal Medicine

## 2021-01-25 ENCOUNTER — Telehealth: Payer: Self-pay | Admitting: *Deleted

## 2021-01-25 NOTE — Chronic Care Management (AMB) (Signed)
  Care Management   Note  01/25/2021 Name: Margaret Vazquez MRN: [id number] DOB: Aug 05, 1935  [name] is a 85 y.o. year old female who is a primary care patient of Tower, [name], MD and is actively engaged with the care management team. I reached out to Zandra Abts by phone today to assist with re-scheduling an initial visit with the RN Case Manager  Follow up plan: Unsuccessful telephone outreach attempt made. A HIPAA compliant phone message was left for the patient providing contact information and requesting a return call.   Julian Hy, [place] Management  Direct Dial: [phone]

## 2021-01-29 NOTE — Chronic Care Management (AMB) (Signed)
  Care Management   Note  01/29/2021 Name: [name] MRN: [id number] DOB: 1936-06-23  Margaret Vazquez is a 85 y.o. year old female who is a primary care patient of Tower, [name], MD and is actively engaged with the care management team. I reached out to Zandra Abts by phone today to assist with re-scheduling an initial visit with the RN Case Manager  Follow up plan: 2nd attempt Unsuccessful telephone outreach attempt made. A HIPAA compliant phone message was left for the patient providing contact information and requesting a return call.   SIGNATURE

## 2021-01-30 ENCOUNTER — Other Ambulatory Visit: Payer: Self-pay | Admitting: Internal Medicine

## 2021-01-31 NOTE — Chronic Care Management (AMB) (Signed)
  Care Management   Note  01/31/2021 Name: Margaret Vazquez MRN: [id number] DOB: Apr 07, 1936  Margaret Vazquez is a 85 y.o. year old female who is a primary care patient of Tower, [name], MD and is actively engaged with the care management team. I reached out to Zandra Abts by phone today to assist with re-scheduling an initial visit with the RN Case Manager  Follow up plan: We have been unable to make contact with the patient for initial outreach from [place], [place], [place]: [phone]

## 2021-02-05 ENCOUNTER — Other Ambulatory Visit: Payer: Self-pay

## 2021-02-05 ENCOUNTER — Ambulatory Visit: Payer: HMO

## 2021-02-05 LAB — EXERCISE TOLERANCE TEST
Peak HR: 104 {beats}/min
Rest HR: 64 {beats}/min

## 2021-02-19 ENCOUNTER — Telehealth: Payer: Self-pay | Admitting: *Deleted

## 2021-02-19 DIAGNOSIS — L03115 Cellulitis of right lower limb: Secondary | ICD-10-CM | POA: Diagnosis not present

## 2021-02-19 NOTE — Chronic Care Management (AMB) (Signed)
  Care Management   Note  02/19/2021 Name: [name] MRN: [id number] DOB: [birth year]  Margaret Vazquez is a 85 y.o. year old female who is a primary care patient of Tower, [name], MD and is actively engaged with the care management team. I reached out to Zandra Abts by phone today to assist with re-scheduling an initial visit with the RN Case Manager  Follow up plan: Unsuccessful telephone outreach attempt made. A HIPAA compliant phone message was left for the patient providing contact information and requesting a return call.   Spoke with Ms Morais and confirmed change of CCM RNCM appt to 03/04/2021  Rescheduled 02/22/2021 appointment to 03/04/2021 @ 9am   Wilson Dusenbery, [place] Management  Direct Dial: [phone]

## 2021-02-22 ENCOUNTER — Telehealth: Payer: HMO

## 2021-03-04 ENCOUNTER — Telehealth: Payer: Self-pay

## 2021-03-04 ENCOUNTER — Telehealth: Payer: HMO

## 2021-03-04 NOTE — Telephone Encounter (Signed)
  Care Management   Follow Up Note   03/04/2021 Name: Margaret Vazquez MRN: [id number] DOB: 09/21/1935   Referred by: Tower, [name], MD Reason for referral : Chronic Care Management (Outreach attempt Initial assessment)   An unsuccessful telephone outreach was attempted today. The patient was referred to the case management team for assistance with care management and care coordination.  Unable to reach patient after several attempts from [facility] and care guide.   Follow Up Plan: Primary care provider notified by care guide of unsuccessful outreach attempts. No further outreach from [facility] needed at this time.   Quinn Plowman RN,BSN,CCM RN Case Manager [place]  [phone]

## 2021-03-12 DIAGNOSIS — Z006 Encounter for examination for normal comparison and control in clinical research program: Secondary | ICD-10-CM

## 2021-03-12 NOTE — Research (Signed)
Phone call made for 21 month visit for ORION 4 trial. Patient states she has been doing well since last phone call and hasn't had any recent medication changes nor any recent cardiac or any other AE to occur since last call. Next phone call to be made on 3//12/2021

## 2021-03-19 ENCOUNTER — Telehealth: Payer: HMO

## 2021-04-01 ENCOUNTER — Ambulatory Visit (INDEPENDENT_AMBULATORY_CARE_PROVIDER_SITE_OTHER): Payer: HMO

## 2021-04-01 DIAGNOSIS — I2510 Atherosclerotic heart disease of native coronary artery without angina pectoris: Secondary | ICD-10-CM

## 2021-04-01 DIAGNOSIS — I480 Paroxysmal atrial fibrillation: Secondary | ICD-10-CM

## 2021-04-01 DIAGNOSIS — I10 Essential (primary) hypertension: Secondary | ICD-10-CM

## 2021-04-01 NOTE — Chronic Care Management (AMB) (Signed)
Chronic Care Management   CCM RN Visit Note  04/01/2021 Name: Margaret Vazquez MRN: [id number] DOB: 1935-07-24  Subjective: Margaret Vazquez is a 85 y.o. year old female who is a primary care patient of Tower, [name], MD. The care management team was consulted for assistance with disease management and care coordination needs.    Engaged with patient by telephone for initial visit in response to provider referral for case management and/or care coordination services.   Consent to Services:  The patient was given the following information about Chronic Care Management services today, agreed to services, and gave verbal consent: 1. CCM service includes personalized support from designated clinical staff supervised by the primary care provider, including individualized plan of care and coordination with other care providers 2. 24/7 contact phone numbers for assistance for urgent and routine care needs. 3. Service will only be billed when office clinical staff spend 20 minutes or more in a month to coordinate care. 4. Only one practitioner may furnish and bill the service in a calendar month. 5.The patient may stop CCM services at any time (effective at the end of the month) by phone call to the office staff. 6. The patient will be responsible for cost sharing (co-pay) of up to 20% of the service fee (after annual deductible is met). Patient agreed to services and consent obtained.  Patient agreed to services and verbal consent obtained.   Assessment: Review of patient past medical history, allergies, medications, health status, including review of consultants reports, laboratory and other test data, was performed as part of comprehensive evaluation and provision of chronic care management services.   SDOH (Social Determinants of Health) assessments and interventions performed:  SDOH Interventions    Flowsheet Row Most Recent Value  SDOH Interventions   Food Insecurity Interventions Intervention Not  Indicated  Housing Interventions Intervention Not Indicated  Transportation Interventions Intervention Not Indicated        CCM Care Plan  Allergies  Allergen Reactions   Bee Venom Hives, Shortness Of Breath and Swelling   Nabumetone Anaphylaxis   Amoxicillin-Pot Clavulanate Hives and Swelling    To lips.   Aspirin Hives   Atorvastatin Swelling     joint pain/swelling, inc liver tests   Clopidogrel Bisulfate Hives   Codeine Nausea And Vomiting   Ezetimibe Other (See Comments)     fatigue   Metformin And Related Other (See Comments)    Diarrhea    Valsartan Other (See Comments)     fatigue   Other Hives    **Red Meat**  SOB    Outpatient Encounter Medications as of 04/01/2021  Medication Sig   apixaban (ELIQUIS) 5 MG TABS tablet Take 1 tablet (5 mg total) by mouth 2 (two) times daily.   Cyanocobalamin (VITAMIN B-12 PO) Take 1 tablet by mouth daily.   diphenhydrAMINE (BENADRYL) 25 MG tablet Take 50 mg by mouth every 8 (eight) hours as needed for allergies.    EPINEPHrine (EPI-PEN) 0.3 mg/0.3 mL DEVI Inject 0.3 mg into the muscle daily as needed (allergic reaction).    fluticasone (FLONASE) 50 MCG/ACT nasal spray Place 2 sprays into both nostrils 2 (two) times daily as needed. Reported on 09/10/2015   glipiZIDE (GLUCOTROL XL) 10 MG 24 hr tablet TAKE 1 TABLET(10 MG) BY MOUTH DAILY WITH BREAKFAST   isosorbide mononitrate (IMDUR) 60 MG 24 hr tablet TAKE 1 TABLET(60 MG) BY MOUTH DAILY   losartan-hydrochlorothiazide (HYZAAR) 50-12.5 MG tablet Take 0.5 tablet (25-6.25 mg) by mouth once  daily   metFORMIN (GLUCOPHAGE-XR) 500 MG 24 hr tablet TAKE 1 TABLET(500 MG) BY MOUTH DAILY WITH BREAKFAST   nitroGLYCERIN (NITROSTAT) 0.4 MG SL tablet Place 1 tablet (0.4 mg total) under the tongue every 5 (five) minutes as needed for chest pain (up to 3 doses. If taking 3rd dose call 911).   potassium chloride SA (KLOR-CON) 20 MEQ tablet Take 1 tablet (20 mEq total) by mouth daily.   propranolol  (INDERAL) 10 MG tablet Take 1 tablet (10 mg) by mouth every 1 hour x 3 doses as needed for chest pain/ fast heart rates   Blood Glucose Monitoring Suppl (ONE TOUCH ULTRA 2) w/Device KIT Use to check blood sugar once daily   glucose blood (ONETOUCH ULTRA) test strip USE TO CHECK BLOOD SUGAR ONCE DAILY (DX CODE: E11.51)   No facility-administered encounter medications on file as of 04/01/2021.    Patient Active Problem List   Diagnosis Date Noted   Elevated TSH 11/05/2020   Allergy to statin medication 03/26/2020   History of loop recorder 09/06/2019   History of seizure 03/23/2019   Breast lump on left side at 10 o'clock position 11/17/2018   Left-sided epistaxis 11/17/2018   Medicare annual wellness visit, subsequent 09/13/2018   Screening mammogram, encounter for 09/13/2018   Herpes zoster 08/10/2018   Plantar wart, left foot 06/15/2018   Coronary artery disease 12/24/2017   Routine general medical examination at a health care facility 08/24/2016   PAF (paroxysmal atrial fibrillation) ([place]) 04/07/2016   Overweight (BMI 25.0-29.9) 08/23/2014   Type 2 diabetes mellitus with diabetic peripheral angiopathy without gangrene, without long-term current use of insulin ([place]) 01/12/2014   TIA (transient ischemic attack) 01/11/2014   History of CVA (cerebrovascular accident) 01/11/2014   LOOP Recorder LINQ 06/07/2013   Sinusitis, chronic 05/16/2013   H/O: CVA (cerebrovascular accident) 02/15/2013   Encounter for Medicare annual wellness exam 10/27/2012   Encopresis(307.7) 12/15/2011   Post-menopausal 10/28/2011   PERSONAL HX COLONIC POLYPS 09/04/2009   B12 deficiency 03/06/2009   ALLERGIC RHINITIS 02/14/2008   BACK PAIN, LUMBAR 11/03/2007   Hyperlipidemia associated with type 2 diabetes mellitus ([place]) 11/24/2006   Essential hypertension 11/24/2006   GERD 10/09/2006   OVERACTIVE BLADDER 10/09/2006   INCONTINENCE, URGE 10/09/2006   SKIN CANCER, HX OF 10/09/2006    Conditions to be  addressed/monitored:Atrial Fibrillation, CAD, and HTN  Care Plan : Cardiovascular  Updates made by Dannielle Karvonen, RN since 04/01/2021 12:00 AM     Problem: Disease progression ( Coronary artery disease, hypertension, Paroxysmal Atrial Fibrillation)   Priority: Medium     Long-Range Goal: Disease Progression Prevented or Minimized   Start Date: 04/01/2021  Expected End Date: 07/05/2021  This Visit's Progress: On track  Priority: Medium  Note:   Current Barriers:  Knowledge deficit related to long term care plan for self management of cardiovascular conditions in patient with hypertension, coronary artery disease, and paroxysmal atrial fibrillation. Patient reports having follow up visit with cardiologist on 11/29/2020.  She reports monitoring her blood pressure at home on occasion.  Patient states she is independent in her care. She reports being able to afford her medications and takes them as prescribed. She states she is able to provider her own transportation to her appointments.   Patient reports having services with [facility]. Case Manager Clinical Goal(s):  Patient will take medications as prescribed Patient will follow up with providers as recommended. Patient will monitor blood pressure at least 1-2 times per week and record Patient  will follow a low fat/ DASH, heart healthy diet Interventions:  Collaboration with Tower, [name], MD regarding development and update of comprehensive plan of care as evidenced by provider attestation and co-signature Inter-disciplinary care team collaboration (see longitudinal plan of care) Evaluation of current treatment plan related to hypertension self management and patient's adherence to plan as established by provider. Reviewed medications with patient and discussed importance of compliance Discussed plans with patient for ongoing care management follow up and provided patient with direct contact information for care management team Reviewed  scheduled/upcoming provider appointments  Sent patient education articles in MyChart on low salt/ heart healthy diet.  Offered follow up with Licensed clinical social worker regarding grief management.  Patient Goals: - Monitor your blood pressure at least 1-2 times per week and record - Take your medications as prescribed and refill timely - follow up with your providers as recommended - Notify your doctor of any new or ongoing symptoms - Follow a low salt, heart healthy diet - get enough sleep -contact your RN case manager for social work follow up for grief counseling if needed. - Review education articles sent to you in MyChart on low salt/ heart healthy diet.   Follow Up Plan: The patient has been provided with contact information for the care management team and has been advised to call with any health related questions or concerns.  The care management team will reach out to the patient again over the next 1-2 months.       Plan:The patient has been provided with contact information for the care management team and has been advised to call with any health related questions or concerns.  and The care management team will reach out to the patient again over the next 1-2 months. Quinn Plowman RN,BSN,CCM RN Case Manager [place]  [phone]

## 2021-04-01 NOTE — Patient Instructions (Signed)
Visit Information:  Thank you for taking the time to speak with me today.   PATIENT GOALS:   Goals Addressed             This Visit's Progress    Monitor and manage my chronic health conditions ( Hypertension, Atrial fibrillation, coronary artery disease_   On track    Timeframe:  Long-Range Goal Priority:  Medium Start Date:     04/01/2021                        Expected End Date: 07/05/2021                      Follow Up Date 04/30/2021    - Monitor your blood pressure at least 1-2 times per week and record - Take your medications as prescribed and refill timely - follow up with your providers as recommended - Notify your doctor of any new or ongoing symptoms - Follow a low salt, heart healthy diet - get enough sleep -contact your RN case manager for social work follow up for grief counseling if needed. - Review education articles sent to you in MyChart on low salt/ heart healthy diet.     Why is this important?   You won't feel high blood pressure, but it can still hurt your blood vessels.  High blood pressure can cause heart or kidney problems. It can also cause a stroke.  Making lifestyle changes like losing a little weight or eating less salt will help.  Checking your blood pressure at home and at different times of the day can help to control blood pressure.  If the doctor prescribes medicine remember to take it the way the doctor ordered.  Call the office if you cannot afford the medicine or if there are questions about it.             Consent to CCM Services: Margaret Vazquez was given information about Chronic Care Management services including:  CCM service includes personalized support from designated clinical staff supervised by her physician, including individualized plan of care and coordination with other care providers 24/7 contact phone numbers for assistance for urgent and routine care needs. Service will only be billed when office clinical staff spend 20  minutes or more in a month to coordinate care. Only one practitioner may furnish and bill the service in a calendar month. The patient may stop CCM services at any time (effective at the end of the month) by phone call to the office staff. The patient will be responsible for cost sharing (co-pay) of up to 20% of the service fee (after annual deductible is met).  Patient agreed to services and verbal consent obtained.   Patient verbalizes understanding of instructions provided today and agrees to view in [place].   The patient has been provided with contact information for the care management team and has been advised to call with any health related questions or concerns.  The care management team will reach out to the patient again over the next 1-2 months.   Quinn Plowman RN,BSN,CCM RN Case Manager Margaret Vazquez  [phone]   CLINICAL CARE PLAN: Patient Care Plan: Cardiovascular     Problem Identified: Disease progression ( Coronary artery disease, hypertension, Paroxysmal Atrial Fibrillation)   Priority: Medium     Long-Range Goal: Disease Progression Prevented or Minimized   Start Date: 04/01/2021  Expected End Date: 07/05/2021  This Visit's Progress: On track  Priority: Medium  Note:   Current Barriers:  Knowledge deficit related to long term care plan for self management of cardiovascular conditions in patient with hypertension, coronary artery disease, and paroxysmal atrial fibrillation. Patient reports having follow up visit with cardiologist on 11/29/2020.  She reports monitoring her blood pressure at home on occasion.  Patient states she is independent in her care. She reports being able to afford her medications and takes them as prescribed. She states she is able to provider her own transportation to her appointments.   Patient reports having services with [facility].  Case Manager Clinical Goal(s):  Patient will take medications as prescribed Patient will follow up  with providers as recommended. Patient will monitor blood pressure at least 1-2 times per week and record Patient will follow a low fat/ DASH, heart healthy diet Interventions:  Collaboration with Tower, [name], MD regarding development and update of comprehensive plan of care as evidenced by provider attestation and co-signature Inter-disciplinary care team collaboration (see longitudinal plan of care) Evaluation of current treatment plan related to hypertension self management and patient's adherence to plan as established by provider. Reviewed medications with patient and discussed importance of compliance Discussed plans with patient for ongoing care management follow up and provided patient with direct contact information for care management team Reviewed scheduled/upcoming provider appointments  Sent patient education articles in MyChart on low salt/ heart healthy diet.  Offered follow up with Licensed clinical social worker regarding grief management.  Patient Goals: - Monitor your blood pressure at least 1-2 times per week and record - Take your medications as prescribed and refill timely - follow up with your providers as recommended - Notify your doctor of any new or ongoing symptoms - Follow a low salt, heart healthy diet - get enough sleep -contact your RN case manager for social work follow up for grief counseling if needed. - Review education articles sent to you in MyChart on low salt/ heart healthy diet.   Follow Up Plan: The patient has been provided with contact information for the care management team and has been advised to call with any health related questions or concerns.  The care management team will reach out to the patient again over the next 1-2 months.

## 2021-04-05 DIAGNOSIS — I480 Paroxysmal atrial fibrillation: Secondary | ICD-10-CM | POA: Diagnosis not present

## 2021-04-05 DIAGNOSIS — I10 Essential (primary) hypertension: Secondary | ICD-10-CM

## 2021-04-05 DIAGNOSIS — I2510 Atherosclerotic heart disease of native coronary artery without angina pectoris: Secondary | ICD-10-CM

## 2021-04-23 DIAGNOSIS — E119 Type 2 diabetes mellitus without complications: Secondary | ICD-10-CM | POA: Diagnosis not present

## 2021-04-23 DIAGNOSIS — H353131 Nonexudative age-related macular degeneration, bilateral, early dry stage: Secondary | ICD-10-CM | POA: Diagnosis not present

## 2021-04-23 LAB — HM DIABETES EYE EXAM

## 2021-04-26 ENCOUNTER — Encounter: Payer: Self-pay | Admitting: Family Medicine

## 2021-04-29 ENCOUNTER — Telehealth: Payer: Self-pay | Admitting: Family Medicine

## 2021-04-29 DIAGNOSIS — I10 Essential (primary) hypertension: Secondary | ICD-10-CM

## 2021-04-29 DIAGNOSIS — R7989 Other specified abnormal findings of blood chemistry: Secondary | ICD-10-CM

## 2021-04-29 DIAGNOSIS — E1169 Type 2 diabetes mellitus with other specified complication: Secondary | ICD-10-CM

## 2021-04-29 DIAGNOSIS — E785 Hyperlipidemia, unspecified: Secondary | ICD-10-CM

## 2021-04-29 DIAGNOSIS — E1151 Type 2 diabetes mellitus with diabetic peripheral angiopathy without gangrene: Secondary | ICD-10-CM

## 2021-04-29 NOTE — Telephone Encounter (Signed)
-----   Message from Ellamae Sia sent at 04/22/2021 12:15 PM EDT ----- Regarding: Lab orders for Wednesday, 10.26.22 Lab orders for a 6 month follow up appt

## 2021-04-30 ENCOUNTER — Ambulatory Visit (INDEPENDENT_AMBULATORY_CARE_PROVIDER_SITE_OTHER): Payer: HMO

## 2021-04-30 DIAGNOSIS — I10 Essential (primary) hypertension: Secondary | ICD-10-CM

## 2021-04-30 DIAGNOSIS — I2510 Atherosclerotic heart disease of native coronary artery without angina pectoris: Secondary | ICD-10-CM

## 2021-04-30 DIAGNOSIS — E785 Hyperlipidemia, unspecified: Secondary | ICD-10-CM

## 2021-04-30 DIAGNOSIS — E1169 Type 2 diabetes mellitus with other specified complication: Secondary | ICD-10-CM

## 2021-05-01 ENCOUNTER — Other Ambulatory Visit (INDEPENDENT_AMBULATORY_CARE_PROVIDER_SITE_OTHER): Payer: HMO

## 2021-05-01 ENCOUNTER — Other Ambulatory Visit: Payer: HMO

## 2021-05-01 ENCOUNTER — Other Ambulatory Visit: Payer: Self-pay

## 2021-05-01 DIAGNOSIS — E1169 Type 2 diabetes mellitus with other specified complication: Secondary | ICD-10-CM | POA: Diagnosis not present

## 2021-05-01 DIAGNOSIS — E785 Hyperlipidemia, unspecified: Secondary | ICD-10-CM

## 2021-05-01 DIAGNOSIS — I10 Essential (primary) hypertension: Secondary | ICD-10-CM | POA: Diagnosis not present

## 2021-05-01 DIAGNOSIS — R7989 Other specified abnormal findings of blood chemistry: Secondary | ICD-10-CM | POA: Diagnosis not present

## 2021-05-01 DIAGNOSIS — E1151 Type 2 diabetes mellitus with diabetic peripheral angiopathy without gangrene: Secondary | ICD-10-CM | POA: Diagnosis not present

## 2021-05-01 LAB — BASIC METABOLIC PANEL
BUN: 20 mg/dL (ref 6–23)
CO2: 31 mEq/L (ref 19–32)
Calcium: 9.2 mg/dL (ref 8.4–10.5)
Chloride: 105 mEq/L (ref 96–112)
Creatinine, Ser: 1.1 mg/dL (ref 0.40–1.20)
GFR: 46.44 mL/min — ABNORMAL LOW (ref >60.00)
Glucose, Bld: 110 mg/dL — ABNORMAL HIGH (ref 70–99)
Potassium: 4.1 mEq/L (ref 3.5–5.1)
Sodium: 141 mEq/L (ref 135–145)

## 2021-05-01 LAB — LIPID PANEL
Cholesterol: 188 mg/dL (ref 0–200)
HDL: 39.9 mg/dL (ref >39.00)
NonHDL: 147.89
Total CHOL/HDL Ratio: 5
Triglycerides: 204 mg/dL — ABNORMAL HIGH (ref 0.0–149.0)
VLDL: 40.8 mg/dL — ABNORMAL HIGH (ref 0.0–40.0)

## 2021-05-01 LAB — TSH: TSH: 2.21 u[IU]/mL (ref 0.35–5.50)

## 2021-05-01 LAB — HEMOGLOBIN A1C: Hgb A1c MFr Bld: 6.2 % (ref 4.6–6.5)

## 2021-05-01 LAB — LDL CHOLESTEROL, DIRECT: Direct LDL: 119 mg/dL

## 2021-05-01 LAB — T4, FREE: Free T4: 0.89 ng/dL (ref 0.60–1.60)

## 2021-05-01 NOTE — Patient Instructions (Signed)
Visit Information:  Thank you for taking the time to speak with me today.   PATIENT GOALS:  Goals Addressed             This Visit's Progress    Monitor and manage my chronic health conditions ( Hypertension, Atrial fibrillation, coronary artery disease_   On track    Timeframe:  Long-Range Goal Priority:  Medium Start Date:     04/01/2021                        Expected End Date: 09/03/2021                     Follow Up Date 06/04/2021   - Continue to monitor your blood pressure at least 1-2 times per week and record -  Continue to take your medications as prescribed and refill timely - follow up with your providers as recommended - Notify your doctor of any new or ongoing symptoms - Follow a low salt, heart healthy diet - get enough sleep -contact your RN case manager for social work follow up for grief counseling if needed.   Why is this important?   You won't feel high blood pressure, but it can still hurt your blood vessels.  High blood pressure can cause heart or kidney problems. It can also cause a stroke.  Making lifestyle changes like losing a little weight or eating less salt will help.  Checking your blood pressure at home and at different times of the day can help to control blood pressure.  If the doctor prescribes medicine remember to take it the way the doctor ordered.  Call the office if you cannot afford the medicine or if there are questions about it.             Patient verbalizes understanding of instructions provided today and agrees to view in [place].   The patient has been provided with contact information for the care management team and has been advised to call with any health related questions or concerns.  The care management team will reach out to the patient again over the next 1-2 months   Quinn Plowman RN,BSN,CCM RN Case Manager [place]  [phone]

## 2021-05-01 NOTE — Chronic Care Management (AMB) (Signed)
Chronic Care Management   CCM RN Visit Note  Late entry for 04/30/2021 05/01/2021 Name: Margaret Vazquez MRN: [id number] DOB: March 20, 1936  Subjective: Margaret Vazquez is a 85 y.o. year old female who is a primary care patient of Tower, [name], MD. The care management team was consulted for assistance with disease management and care coordination needs.    Engaged with patient by telephone for follow up visit in response to provider referral for case management and/or care coordination services.   Consent to Services:  The patient was given information about Chronic Care Management services, agreed to services, and gave verbal consent prior to initiation of services.  Please see initial visit note for detailed documentation.   Patient agreed to services and verbal consent obtained.   Assessment: Review of patient past medical history, allergies, medications, health status, including review of consultants reports, laboratory and other test data, was performed as part of comprehensive evaluation and provision of chronic care management services.   SDOH (Social Determinants of Health) assessments and interventions performed:    CCM Care Plan  Allergies  Allergen Reactions   Bee Venom Hives, Shortness Of Breath and Swelling   Nabumetone Anaphylaxis   Amoxicillin-Pot Clavulanate Hives and Swelling    To lips.   Aspirin Hives   Atorvastatin Swelling     joint pain/swelling, inc liver tests   Clopidogrel Bisulfate Hives   Codeine Nausea And Vomiting   Ezetimibe Other (See Comments)     fatigue   Metformin And Related Other (See Comments)    Diarrhea    Valsartan Other (See Comments)     fatigue   Other Hives    **Red Meat**  SOB    Outpatient Encounter Medications as of 04/30/2021  Medication Sig   apixaban (ELIQUIS) 5 MG TABS tablet Take 1 tablet (5 mg total) by mouth 2 (two) times daily.   Blood Glucose Monitoring Suppl (ONE TOUCH ULTRA 2) w/Device KIT Use to check blood sugar  once daily   Cyanocobalamin (VITAMIN B-12 PO) Take 1 tablet by mouth daily.   diphenhydrAMINE (BENADRYL) 25 MG tablet Take 50 mg by mouth every 8 (eight) hours as needed for allergies.    EPINEPHrine (EPI-PEN) 0.3 mg/0.3 mL DEVI Inject 0.3 mg into the muscle daily as needed (allergic reaction).    fluticasone (FLONASE) 50 MCG/ACT nasal spray Place 2 sprays into both nostrils 2 (two) times daily as needed. Reported on 09/10/2015   glipiZIDE (GLUCOTROL XL) 10 MG 24 hr tablet TAKE 1 TABLET(10 MG) BY MOUTH DAILY WITH BREAKFAST   glucose blood (ONETOUCH ULTRA) test strip USE TO CHECK BLOOD SUGAR ONCE DAILY (DX CODE: E11.51)   isosorbide mononitrate (IMDUR) 60 MG 24 hr tablet TAKE 1 TABLET(60 MG) BY MOUTH DAILY   losartan-hydrochlorothiazide (HYZAAR) 50-12.5 MG tablet Take 0.5 tablet (25-6.25 mg) by mouth once daily   metFORMIN (GLUCOPHAGE-XR) 500 MG 24 hr tablet TAKE 1 TABLET(500 MG) BY MOUTH DAILY WITH BREAKFAST   nitroGLYCERIN (NITROSTAT) 0.4 MG SL tablet Place 1 tablet (0.4 mg total) under the tongue every 5 (five) minutes as needed for chest pain (up to 3 doses. If taking 3rd dose call 911).   potassium chloride SA (KLOR-CON) 20 MEQ tablet Take 1 tablet (20 mEq total) by mouth daily.   propranolol (INDERAL) 10 MG tablet Take 1 tablet (10 mg) by mouth every 1 hour x 3 doses as needed for chest pain/ fast heart rates   No facility-administered encounter medications on file as of 04/30/2021.  Patient Active Problem List   Diagnosis Date Noted   Elevated TSH 11/05/2020   Allergy to statin medication 03/26/2020   History of loop recorder 09/06/2019   History of seizure 03/23/2019   Breast lump on left side at 10 o'clock position 11/17/2018   Left-sided epistaxis 11/17/2018   Medicare annual wellness visit, subsequent 09/13/2018   Screening mammogram, encounter for 09/13/2018   Herpes zoster 08/10/2018   Plantar wart, left foot 06/15/2018   Coronary artery disease 12/24/2017   Routine general  medical examination at a health care facility 08/24/2016   PAF (paroxysmal atrial fibrillation) ([place]) 04/07/2016   Overweight (BMI 25.0-29.9) 08/23/2014   Type 2 diabetes mellitus with diabetic peripheral angiopathy without gangrene, without long-term current use of insulin ([place]) 01/12/2014   TIA (transient ischemic attack) 01/11/2014   History of CVA (cerebrovascular accident) 01/11/2014   LOOP Recorder LINQ 06/07/2013   Sinusitis, chronic 05/16/2013   H/O: CVA (cerebrovascular accident) 02/15/2013   Encounter for Medicare annual wellness exam 10/27/2012   Encopresis(307.7) 12/15/2011   Post-menopausal 10/28/2011   PERSONAL HX COLONIC POLYPS 09/04/2009   B12 deficiency 03/06/2009   ALLERGIC RHINITIS 02/14/2008   BACK PAIN, LUMBAR 11/03/2007   Hyperlipidemia associated with type 2 diabetes mellitus ([place]) 11/24/2006   Essential hypertension 11/24/2006   GERD 10/09/2006   OVERACTIVE BLADDER 10/09/2006   INCONTINENCE, URGE 10/09/2006   SKIN CANCER, HX OF 10/09/2006    Conditions to be addressed/monitored:Atrial Fibrillation, CAD, and HTN  Care Plan : Cardiovascular  Updates made by Dannielle Karvonen, RN since 05/01/2021 12:00 AM     Problem: Disease progression ( Coronary artery disease, hypertension, Paroxysmal Atrial Fibrillation)   Priority: Medium     Long-Range Goal: Disease Progression Prevented or Minimized   Start Date: 04/01/2021  Expected End Date: 09/03/2021  This Visit's Progress: On track  Recent Progress: On track  Priority: Medium  Note:   Current Barriers:  Knowledge deficit related to long term care plan for self management of cardiovascular conditions in patient with hypertension, coronary artery disease, and paroxysmal atrial fibrillation. Patient states she continues to deal with the recent death of her daughter and her sister's oldest son past away 1 week ago.  RNCM allowed patient opportunity to express her feelings.  Offered patient referral to Education officer, museum  for grief counseling resources.  Patient declined stating she is managing right now on her own. She states she has a strong support system through her church and with family.   Patient states she is doing better health wise. She reports pulse rate ranging from 59 to low 70's. She reports 1 blood pressure reading of 130/87.  She states she has not been as consistent with monitoring her blood pressure. RNCM reminded patient of next follow up visit with her primary care provider is 05/08/2022.  Encouraged patient to continue to monitor pulse/ bloood pressure and report any concerns to her provider.   Case Manager Clinical Goal(s):  Patient will take medications as prescribed Patient will follow up with providers as recommended. Patient will monitor blood pressure at least 1-2 times per week and record Patient will follow a low fat/ DASH, heart healthy diet Interventions:  Collaboration with Tower, [name], MD regarding development and update of comprehensive plan of care as evidenced by provider attestation and co-signature Inter-disciplinary care team collaboration (see longitudinal plan of care) Evaluation of current treatment plan related to hypertension self management and patient's adherence to plan as established by provider. Reviewed medications with patient and  discussed importance of compliance Discussed plans with patient for ongoing care management follow up and provided patient with direct contact information for care management team Reviewed scheduled/upcoming provider appointments  Sent patient education articles in MyChart on low salt/ heart healthy diet.  Offered follow up with Licensed clinical social worker regarding grief management.  Patient Goals: - Continue to monitor your blood pressure at least 1-2 times per week and record -  Continue to take your medications as prescribed and refill timely - follow up with your providers as recommended - Notify your doctor of any new or  ongoing symptoms - Follow a low salt, heart healthy diet - get enough sleep -contact your RN case manager for social work follow up for grief counseling if needed.   Follow Up Plan: The patient has been provided with contact information for the care management team and has been advised to call with any health related questions or concerns.  The care management team will reach out to the patient again over the next 1-2 months.         Plan:The patient has been provided with contact information for the care management team and has been advised to call with any health related questions or concerns.  The care management team will reach out to the patient again over the next 1-2 months. Quinn Plowman RN,BSN,CCM RN Case Manager [place]  [phone]

## 2021-05-02 DIAGNOSIS — L570 Actinic keratosis: Secondary | ICD-10-CM | POA: Diagnosis not present

## 2021-05-02 DIAGNOSIS — D2262 Melanocytic nevi of left upper limb, including shoulder: Secondary | ICD-10-CM | POA: Diagnosis not present

## 2021-05-02 DIAGNOSIS — D485 Neoplasm of uncertain behavior of skin: Secondary | ICD-10-CM | POA: Diagnosis not present

## 2021-05-02 DIAGNOSIS — Z85828 Personal history of other malignant neoplasm of skin: Secondary | ICD-10-CM | POA: Diagnosis not present

## 2021-05-02 DIAGNOSIS — D2261 Melanocytic nevi of right upper limb, including shoulder: Secondary | ICD-10-CM | POA: Diagnosis not present

## 2021-05-02 DIAGNOSIS — D2271 Melanocytic nevi of right lower limb, including hip: Secondary | ICD-10-CM | POA: Diagnosis not present

## 2021-05-02 DIAGNOSIS — X32XXXA Exposure to sunlight, initial encounter: Secondary | ICD-10-CM | POA: Diagnosis not present

## 2021-05-02 DIAGNOSIS — C44622 Squamous cell carcinoma of skin of right upper limb, including shoulder: Secondary | ICD-10-CM | POA: Diagnosis not present

## 2021-05-06 DIAGNOSIS — E785 Hyperlipidemia, unspecified: Secondary | ICD-10-CM

## 2021-05-06 DIAGNOSIS — E1169 Type 2 diabetes mellitus with other specified complication: Secondary | ICD-10-CM | POA: Diagnosis not present

## 2021-05-06 DIAGNOSIS — I2510 Atherosclerotic heart disease of native coronary artery without angina pectoris: Secondary | ICD-10-CM

## 2021-05-06 DIAGNOSIS — I10 Essential (primary) hypertension: Secondary | ICD-10-CM | POA: Diagnosis not present

## 2021-05-08 ENCOUNTER — Encounter: Payer: Self-pay | Admitting: Family Medicine

## 2021-05-08 ENCOUNTER — Other Ambulatory Visit: Payer: Self-pay

## 2021-05-08 ENCOUNTER — Ambulatory Visit (INDEPENDENT_AMBULATORY_CARE_PROVIDER_SITE_OTHER): Payer: HMO | Admitting: Family Medicine

## 2021-05-08 VITALS — BP 128/84 | HR 57 | Temp 97.2°F | Ht 67.5 in | Wt 189.2 lb

## 2021-05-08 DIAGNOSIS — F4321 Adjustment disorder with depressed mood: Secondary | ICD-10-CM | POA: Diagnosis not present

## 2021-05-08 DIAGNOSIS — F4320 Adjustment disorder, unspecified: Secondary | ICD-10-CM | POA: Insufficient documentation

## 2021-05-08 DIAGNOSIS — E1169 Type 2 diabetes mellitus with other specified complication: Secondary | ICD-10-CM

## 2021-05-08 DIAGNOSIS — E1151 Type 2 diabetes mellitus with diabetic peripheral angiopathy without gangrene: Secondary | ICD-10-CM

## 2021-05-08 DIAGNOSIS — Z23 Encounter for immunization: Secondary | ICD-10-CM | POA: Diagnosis not present

## 2021-05-08 DIAGNOSIS — E785 Hyperlipidemia, unspecified: Secondary | ICD-10-CM | POA: Diagnosis not present

## 2021-05-08 DIAGNOSIS — I10 Essential (primary) hypertension: Secondary | ICD-10-CM

## 2021-05-08 NOTE — Assessment & Plan Note (Signed)
Lab Results  Component Value Date   HGBA1C 6.2 05/01/2021   Mindful about diet/also eating less in grief Taking metformin xr 500 mg daily  Glipizide xl 10 mg daily (inst to hold if she skips meals) On arb Statin allergic  Eye exam last month Good foot care

## 2021-05-08 NOTE — Assessment & Plan Note (Signed)
Lost daughter unexpectedly (found dead)  Pending post mort, some ? Of drug overdose  She is actually functioning well  Disc need to let grief happen Offered counseling  Has good support

## 2021-05-08 NOTE — Progress Notes (Signed)
Subjective:    Patient ID: Margaret Vazquez, female    DOB: 26-Jul-1935, 85 y.o.   MRN: [id number]  This visit occurred during the SARS-CoV-2 public health emergency.  Safety protocols were in place, including screening questions prior to the visit, additional usage of staff PPE, and extensive cleaning of exam room while observing appropriate contact time as indicated for disinfecting solutions.   HPI Pt presents for f/u of chronic health problems including DM2 and HTN and hyperlipidemia  Wt Readings from Last 3 Encounters:  05/08/21 189 lb 4 oz (85.8 kg)  11/29/20 191 lb (86.6 kg)  11/05/20 192 lb (87.1 kg)   29.20 kg/m  Grief, lost daughter and nephew  Found daughter dead, ? Of drug overdose, does not know yet , post mort pending Had a h/o melanoma years before   Worries about her husband also   She talks to church friends  Declines grief counseling  Some bad days   HTN bp is stable today  No cp or palpitations or headaches or edema  No side effects to medicines  BP Readings from Last 3 Encounters:  05/08/21 128/84  11/29/20 130/68  11/05/20 (!) 116/92     Pulse Readings from Last 3 Encounters:  05/08/21 (!) 57  11/29/20 (!) 56  11/05/20 61   Taking Losartan hct 50-12.5 mg 1/2 pill daily Imdur 60 mg daily Propranolol as needed for rapid pulse  DM2 Lab Results  Component Value Date   HGBA1C 6.2 05/01/2021   Appetite is fair - less with grief  Not eating as much  Grapes and peanut butter   Down from 6.2 Taking metformin xr 500 mg daily  Glipizide xl 10 mg daily  Statin allergic Taking arb Eye care utd in October   Flu shot given today   Hyperlipidemia Lab Results  Component Value Date   CHOL 188 05/01/2021   CHOL 187 09/18/2020   CHOL 190 03/22/2020   Lab Results  Component Value Date   HDL 39.90 05/01/2021   HDL 51.00 09/18/2020   HDL 41.20 03/22/2020   Lab Results  Component Value Date   LDLCALC 117 (H) 09/18/2020   LDLCALC 109 (H)  03/22/2020   LDLCALC 114 (H) 09/14/2019   Lab Results  Component Value Date   TRIG 204.0 (H) 05/01/2021   TRIG 94.0 09/18/2020   TRIG 197.0 (H) 03/22/2020   Lab Results  Component Value Date   CHOLHDL 5 05/01/2021   CHOLHDL 4 09/18/2020   CHOLHDL 5 03/22/2020   Lab Results  Component Value Date   LDLDIRECT 119.0 05/01/2021   LDLDIRECT 107.0 08/27/2016   LDLDIRECT 136.5 10/20/2011   Statin allergic  HDL is down -? Less active   Patient Active Problem List   Diagnosis Date Noted   Grief reaction 05/08/2021   Elevated TSH 11/05/2020   Allergy to statin medication 03/26/2020   History of loop recorder 09/06/2019   History of seizure 03/23/2019   Breast lump on left side at 10 o'clock position 11/17/2018   Left-sided epistaxis 11/17/2018   Medicare annual wellness visit, subsequent 09/13/2018   Screening mammogram, encounter for 09/13/2018   Herpes zoster 08/10/2018   Plantar wart, left foot 06/15/2018   Coronary artery disease 12/24/2017   Routine general medical examination at a health care facility 08/24/2016   PAF (paroxysmal atrial fibrillation) ([place]) 04/07/2016   Overweight (BMI 25.0-29.9) 08/23/2014   Type 2 diabetes mellitus with diabetic peripheral angiopathy without gangrene, without long-term current use of insulin (  [place]) 01/12/2014   TIA (transient ischemic attack) 01/11/2014   History of CVA (cerebrovascular accident) 01/11/2014   LOOP Recorder LINQ 06/07/2013   Sinusitis, chronic 05/16/2013   H/O: CVA (cerebrovascular accident) 02/15/2013   Encounter for Medicare annual wellness exam 10/27/2012   Encopresis(307.7) 12/15/2011   Post-menopausal 10/28/2011   PERSONAL HX COLONIC POLYPS 09/04/2009   B12 deficiency 03/06/2009   ALLERGIC RHINITIS 02/14/2008   BACK PAIN, LUMBAR 11/03/2007   Hyperlipidemia associated with type 2 diabetes mellitus ([place]) 11/24/2006   Essential hypertension 11/24/2006   GERD 10/09/2006   OVERACTIVE BLADDER 10/09/2006    INCONTINENCE, URGE 10/09/2006   SKIN CANCER, HX OF 10/09/2006   Past Medical History:  Diagnosis Date   Allergy history, drug    Aspirin   Basal cell carcinoma    "back"   CAD (coronary artery disease)    a. Previously nonobstructive then progressive angina with abnl CT -> cardiac cath 12/24/17 showed 30% mid RCA and 80% prox-mid Cx. She received DES to mid AV groove Cx. EF 55-65%.    Cervical stenosis of spine    With neck pain   CKD (chronic kidney disease), stage II    Colon polyps 5638   Complication of anesthesia 1980s   slow to wake after anesthesia "when I had breast biopsy"   GERD (gastroesophageal reflux disease)    Hyperlipidemia    myalgias with Lipitor and Zetia   Hypertension    Migraine    "stopped at age 33" (12/24/2017)   Squamous carcinoma    "iced off and cut off; mostly arms" (12/24/2017)   Stroke [facility])    "told me I'd had 2 strokes in 02/2013"; denies residual on 12/24/2017   TIA (transient ischemic attack)    Type II diabetes mellitus ([place])    Past Surgical History:  Procedure Laterality Date   ABD [place]  07/2003   Negative   APPENDECTOMY     BASAL CELL CARCINOMA EXCISION     "back"   BREAST BIOPSY Left 1992   benign   CARDIAC CATHETERIZATION  04/07/2011   non obst CAD (Dr Burt Knack)   CATARACT EXTRACTION W/PHACO Left 04/01/2017   Procedure: CATARACT EXTRACTION PHACO AND INTRAOCULAR LENS PLACEMENT ([place]) LEFT DIABETIC;  Surgeon: Leandrew Koyanagi, MD;  Location: [place];  Service: Ophthalmology;  Laterality: Left;  Diabetic - oral meds   CATARACT EXTRACTION W/PHACO Right 04/29/2017   Procedure: CATARACT EXTRACTION PHACO AND INTRAOCULAR LENS PLACEMENT ([place]) RIGHT DIABETIC;  Surgeon: Leandrew Koyanagi, MD;  Location: [place];  Service: Ophthalmology;  Laterality: Right;  Diabetic - oral meds   COLONOSCOPY  12/2007   Adenomatous colon polyps   CORONARY ANGIOPLASTY WITH STENT PLACEMENT  12/24/2017   CORONARY STENT INTERVENTION N/A  12/24/2017   Procedure: CORONARY STENT INTERVENTION;  Surgeon: Lorretta Harp, MD;  Location: [place] CV LAB;  Service: Cardiovascular;  Laterality: N/A;   CYSTOSCOPY W/ DECANNULATION  03/2000   Normal   LEFT HEART CATH AND CORONARY ANGIOGRAPHY N/A 12/24/2017   Procedure: LEFT HEART CATH AND CORONARY ANGIOGRAPHY;  Surgeon: Lorretta Harp, MD;  Location: [place] CV LAB;  Service: Cardiovascular;  Laterality: N/A;   LOOP RECORDER IMPLANT N/A 02/16/2013   Procedure: LOOP RECORDER IMPLANT;  Surgeon: Deboraha Sprang, MD;  Location: [facility] CATH LAB;  Service: Cardiovascular;  Laterality: N/A;   MOHS SURGERY     right hand    NASAL SINUS SURGERY  01/2005   SQUAMOUS CELL CARCINOMA EXCISION     "  mostly arms;" (12/24/2017)   STRESS CARDIOLITE  11/1999   Normal/ negative   TEAR DUCT PROBING  2005   "? side"   TEE WITHOUT CARDIOVERSION N/A 02/16/2013   Procedure: TRANSESOPHAGEAL ECHOCARDIOGRAM (TEE);  Surgeon: Josue Hector, MD;  Location: [facility] ENDOSCOPY;  Service: Cardiovascular;  Laterality: N/A;   TUBAL LIGATION     BTL   Social History   Tobacco Use   Smoking status: Never   Smokeless tobacco: Never  Vaping Use   Vaping Use: Never used  Substance Use Topics   Alcohol use: Never    Alcohol/week: 0.0 standard drinks   Drug use: Never   Family History  Problem Relation Age of Onset   Lung cancer Brother    Diabetes Brother    Pancreatic cancer Brother    Heart disease Mother    Heart disease Father    Brain cancer Other    Skin cancer Daughter    Diabetes Sister    Breast cancer Sister    Colon cancer Neg Hx    Allergies  Allergen Reactions   Bee Venom Hives, Shortness Of Breath and Swelling   Nabumetone Anaphylaxis   Amoxicillin-Pot Clavulanate Hives and Swelling    To lips.   Aspirin Hives   Atorvastatin Swelling     joint pain/swelling, inc liver tests   Clopidogrel Bisulfate Hives   Codeine Nausea And Vomiting   Ezetimibe Other (See Comments)     fatigue    Metformin And Related Other (See Comments)    Diarrhea    Valsartan Other (See Comments)     fatigue   Other Hives    **Red Meat**  SOB   Current Outpatient Medications on File Prior to Visit  Medication Sig Dispense Refill   apixaban (ELIQUIS) 5 MG TABS tablet Take 1 tablet (5 mg total) by mouth 2 (two) times daily. 60 tablet 11   Blood Glucose Monitoring Suppl (ONE TOUCH ULTRA 2) w/Device KIT Use to check blood sugar once daily 1 each 0   Cyanocobalamin (VITAMIN B-12 PO) Take 1 tablet by mouth daily.     diphenhydrAMINE (BENADRYL) 25 MG tablet Take 50 mg by mouth every 8 (eight) hours as needed for allergies.      EPINEPHrine (EPI-PEN) 0.3 mg/0.3 mL DEVI Inject 0.3 mg into the muscle daily as needed (allergic reaction).      fluticasone (FLONASE) 50 MCG/ACT nasal spray Place 2 sprays into both nostrils 2 (two) times daily as needed. Reported on 09/10/2015     glipiZIDE (GLUCOTROL XL) 10 MG 24 hr tablet TAKE 1 TABLET(10 MG) BY MOUTH DAILY WITH BREAKFAST 90 tablet 1   glucose blood (ONETOUCH ULTRA) test strip USE TO CHECK BLOOD SUGAR ONCE DAILY (DX CODE: E11.51) 100 each 1   isosorbide mononitrate (IMDUR) 60 MG 24 hr tablet TAKE 1 TABLET(60 MG) BY MOUTH DAILY 90 tablet 3   losartan-hydrochlorothiazide (HYZAAR) 50-12.5 MG tablet Take 0.5 tablet (25-6.25 mg) by mouth once daily 45 tablet 3   metFORMIN (GLUCOPHAGE-XR) 500 MG 24 hr tablet TAKE 1 TABLET(500 MG) BY MOUTH DAILY WITH BREAKFAST 90 tablet 3   nitroGLYCERIN (NITROSTAT) 0.4 MG SL tablet Place 1 tablet (0.4 mg total) under the tongue every 5 (five) minutes as needed for chest pain (up to 3 doses. If taking 3rd dose call 911). 25 tablet 3   potassium chloride SA (KLOR-CON) 20 MEQ tablet Take 1 tablet (20 mEq total) by mouth daily. 90 tablet 3   propranolol (INDERAL) 10 MG tablet  Take 1 tablet (10 mg) by mouth every 1 hour x 3 doses as needed for chest pain/ fast heart rates 30 tablet 1   No current facility-administered medications on file  prior to visit.    Review of Systems  Constitutional:  Positive for fatigue. Negative for activity change, appetite change, fever and unexpected weight change.  HENT:  Negative for congestion, ear pain, rhinorrhea, sinus pressure and sore throat.   Eyes:  Negative for pain, redness and visual disturbance.  Respiratory:  Negative for cough, shortness of breath and wheezing.   Cardiovascular:  Negative for chest pain and palpitations.  Gastrointestinal:  Negative for abdominal pain, blood in stool, constipation and diarrhea.  Endocrine: Negative for polydipsia and polyuria.  Genitourinary:  Negative for dysuria, frequency and urgency.  Musculoskeletal:  Negative for arthralgias, back pain and myalgias.  Skin:  Negative for pallor and rash.  Allergic/Immunologic: Negative for environmental allergies.  Neurological:  Negative for dizziness, syncope and headaches.  Hematological:  Negative for adenopathy. Does not bruise/bleed easily.  Psychiatric/Behavioral:  Positive for dysphoric mood. Negative for decreased concentration. The patient is not nervous/anxious.       Objective:   Physical Exam Constitutional:      General: She is not in acute distress.    Appearance: Normal appearance. She is well-developed and normal weight. She is not ill-appearing or diaphoretic.  HENT:     Head: Normocephalic and atraumatic.  Eyes:     Conjunctiva/sclera: Conjunctivae normal.     Pupils: Pupils are equal, round, and reactive to light.  Neck:     Thyroid: No thyromegaly.     Vascular: No carotid bruit or JVD.  Cardiovascular:     Rate and Rhythm: Normal rate and regular rhythm.     Heart sounds: Normal heart sounds.    No gallop.  Pulmonary:     Effort: Pulmonary effort is normal. No respiratory distress.     Breath sounds: Normal breath sounds. No wheezing or rales.  Abdominal:     General: Bowel sounds are normal. There is no distension or abdominal bruit.     Palpations: Abdomen is soft.  There is no mass.     Tenderness: There is no abdominal tenderness.  Musculoskeletal:     Cervical back: Normal range of motion and neck supple.     Right lower leg: No edema.     Left lower leg: No edema.  Lymphadenopathy:     Cervical: No cervical adenopathy.  Skin:    General: Skin is warm and dry.     Coloration: Skin is not pale.     Findings: No rash.  Neurological:     Mental Status: She is alert.     Coordination: Coordination normal.     Deep Tendon Reflexes: Reflexes are normal and symmetric. Reflexes normal.  Psychiatric:        Mood and Affect: Mood is depressed.        Cognition and Memory: Cognition and memory normal.     Comments: Tearful when discussing loss of her daughter          Assessment & Plan:   Problem List Items Addressed This Visit       Cardiovascular and Mediastinum   Essential hypertension    bp in fair control at this time  BP Readings from Last 1 Encounters:  05/08/21 128/84  No changes needed Most recent labs reviewed  Disc lifstyle change with low sodium diet and exercise  Plan to continue  Losartan hct 50-12.5 mg 1/2 pill daily Imdur 60 mg daily Propranolol as needed for rapid pulse      Type 2 diabetes mellitus with diabetic peripheral angiopathy without gangrene, without long-term current use of insulin (HCC) - Primary    Lab Results  Component Value Date   HGBA1C 6.2 05/01/2021  Mindful about diet/also eating less in grief Taking metformin xr 500 mg daily  Glipizide xl 10 mg daily (inst to hold if she skips meals) On arb Statin allergic  Eye exam last month Good foot care         Endocrine   Hyperlipidemia associated with type 2 diabetes mellitus ([place])    Disc goals for lipids and reasons to control them Rev last labs with pt Rev low sat fat diet in detail HDL is down with recent lack of physical activity  Enc her to work on this when able        Other   Grief reaction    Lost daughter unexpectedly (found  dead)  Pending post mort, some ? Of drug overdose  She is actually functioning well  Disc need to let grief happen Offered counseling  Has good support       Other Visit Diagnoses     Need for influenza vaccination       Relevant Orders   Flu Vaccine QUAD High Dose(Fluad) (Completed)

## 2021-05-08 NOTE — Assessment & Plan Note (Signed)
Disc goals for lipids and reasons to control them Rev last labs with pt Rev low sat fat diet in detail HDL is down with recent lack of physical activity  Enc her to work on this when able

## 2021-05-08 NOTE — Patient Instructions (Addendum)
Please make sure you don't skip meals (glucose can get low)   Physical activity and exercise are good for your cholesterol level   Take care of yourself   Let us know if you would like to see a grief counselor   Follow up in 6 months for your annual exam

## 2021-05-08 NOTE — Assessment & Plan Note (Signed)
bp in fair control at this time  BP Readings from Last 1 Encounters:  05/08/21 128/84   No changes needed Most recent labs reviewed  Disc lifstyle change with low sodium diet and exercise  Plan to continue  Losartan hct 50-12.5 mg 1/2 pill daily Imdur 60 mg daily Propranolol as needed for rapid pulse

## 2021-05-09 ENCOUNTER — Other Ambulatory Visit: Payer: Self-pay | Admitting: Family Medicine

## 2021-05-09 DIAGNOSIS — Z1231 Encounter for screening mammogram for malignant neoplasm of breast: Secondary | ICD-10-CM

## 2021-05-10 DIAGNOSIS — I10 Essential (primary) hypertension: Secondary | ICD-10-CM | POA: Diagnosis not present

## 2021-05-10 DIAGNOSIS — E1169 Type 2 diabetes mellitus with other specified complication: Secondary | ICD-10-CM | POA: Diagnosis not present

## 2021-05-10 DIAGNOSIS — E1151 Type 2 diabetes mellitus with diabetic peripheral angiopathy without gangrene: Secondary | ICD-10-CM | POA: Diagnosis not present

## 2021-05-10 DIAGNOSIS — E1142 Type 2 diabetes mellitus with diabetic polyneuropathy: Secondary | ICD-10-CM | POA: Diagnosis not present

## 2021-05-10 DIAGNOSIS — E663 Overweight: Secondary | ICD-10-CM | POA: Diagnosis not present

## 2021-05-29 ENCOUNTER — Other Ambulatory Visit: Payer: Self-pay | Admitting: *Deleted

## 2021-05-29 MED ORDER — APIXABAN 5 MG PO TABS: 5.0000 mg | ORAL_TABLET | Freq: Two times a day (BID) | ORAL | 5 refills | Status: DC

## 2021-05-29 NOTE — Telephone Encounter (Signed)
Prescription refill request for Eliquis received. Indication: afib  Last office visit: Caryl Comes, 11/29/2020 Scr: 1.10, 05/01/2021 Age: 85 yo  Weight: 85.8 kg   Refill sent.

## 2021-06-04 ENCOUNTER — Ambulatory Visit (INDEPENDENT_AMBULATORY_CARE_PROVIDER_SITE_OTHER): Payer: HMO

## 2021-06-04 DIAGNOSIS — I10 Essential (primary) hypertension: Secondary | ICD-10-CM

## 2021-06-04 DIAGNOSIS — I480 Paroxysmal atrial fibrillation: Secondary | ICD-10-CM

## 2021-06-04 DIAGNOSIS — I2510 Atherosclerotic heart disease of native coronary artery without angina pectoris: Secondary | ICD-10-CM

## 2021-06-04 NOTE — Patient Instructions (Signed)
Visit Information  Thank you for taking time to visit with me today. Please don't hesitate to contact me if I can be of assistance to you before our next scheduled telephone appointment.  Patient Goals/Self-Care Activities: - check pulse (heart) rate once a day  - Continue to monitor your blood pressure at least 1-2 times per week and record -  Continue to take your medications as prescribed and refill timely - follow up with your providers as recommended - Notify your doctor of any new or ongoing symptoms - Follow a low salt, heart healthy diet - get enough sleep -contact your RN case manager for social work follow up for grief counseling if needed.  Our next appointment is by telephone on August 19, 2020 at 1:00 pm  Please call the care guide team at [phone] if you need to cancel or reschedule your appointment.   If you are experiencing a Mental Health or [place] or need someone to talk to, please call 1-800-273-TALK (toll free, 24 hour hotline)   Patient verbalizes understanding of instructions provided today and agrees to view in [place].   Quinn Plowman RN,BSN,CCM RN Case Manager [place]  [phone]

## 2021-06-04 NOTE — Chronic Care Management (AMB) (Signed)
Chronic Care Management   CCM RN Visit Note  06/04/2021 Name: Margaret Vazquez MRN: [id number] DOB: 10-14-1935  Subjective: Margaret Vazquez is a 85 y.o. year old female who is a primary care patient of Tower, [name], MD. The care management team was consulted for assistance with disease management and care coordination needs.    Engaged with patient by telephone for follow up visit in response to provider referral for case management and/or care coordination services.   Consent to Services:  The patient was given the following information about Chronic Care Management services today, agreed to services, and gave verbal consent: 1. CCM service includes personalized support from designated clinical staff supervised by the primary care provider, including individualized plan of care and coordination with other care providers 2. 24/7 contact phone numbers for assistance for urgent and routine care needs. 3. Service will only be billed when office clinical staff spend 20 minutes or more in a month to coordinate care. 4. Only one practitioner may furnish and bill the service in a calendar month. 5.The patient may stop CCM services at any time (effective at the end of the month) by phone call to the office staff. 6. The patient will be responsible for cost sharing (co-pay) of up to 20% of the service fee (after annual deductible is met). Patient agreed to services and consent obtained.  Patient agreed to services and verbal consent obtained.   Assessment: Review of patient past medical history, allergies, medications, health status, including review of consultants reports, laboratory and other test data, was performed as part of comprehensive evaluation and provision of chronic care management services.   SDOH (Social Determinants of Health) assessments and interventions performed:    CCM Care Plan  Allergies  Allergen Reactions   Bee Venom Hives, Shortness Of Breath and Swelling   Nabumetone  Anaphylaxis   Amoxicillin-Pot Clavulanate Hives and Swelling    To lips.   Aspirin Hives   Atorvastatin Swelling     joint pain/swelling, inc liver tests   Clopidogrel Bisulfate Hives   Codeine Nausea And Vomiting   Ezetimibe Other (See Comments)     fatigue   Metformin And Related Other (See Comments)    Diarrhea    Valsartan Other (See Comments)     fatigue   Other Hives    **Red Meat**  SOB    Outpatient Encounter Medications as of 06/04/2021  Medication Sig   apixaban (ELIQUIS) 5 MG TABS tablet Take 1 tablet (5 mg total) by mouth 2 (two) times daily.   Blood Glucose Monitoring Suppl (ONE TOUCH ULTRA 2) w/Device KIT Use to check blood sugar once daily   Cyanocobalamin (VITAMIN B-12 PO) Take 1 tablet by mouth daily.   diphenhydrAMINE (BENADRYL) 25 MG tablet Take 50 mg by mouth every 8 (eight) hours as needed for allergies.    EPINEPHrine (EPI-PEN) 0.3 mg/0.3 mL DEVI Inject 0.3 mg into the muscle daily as needed (allergic reaction).    fluticasone (FLONASE) 50 MCG/ACT nasal spray Place 2 sprays into both nostrils 2 (two) times daily as needed. Reported on 09/10/2015   glipiZIDE (GLUCOTROL XL) 10 MG 24 hr tablet TAKE 1 TABLET(10 MG) BY MOUTH DAILY WITH BREAKFAST   glucose blood (ONETOUCH ULTRA) test strip USE TO CHECK BLOOD SUGAR ONCE DAILY (DX CODE: E11.51)   isosorbide mononitrate (IMDUR) 60 MG 24 hr tablet TAKE 1 TABLET(60 MG) BY MOUTH DAILY   losartan-hydrochlorothiazide (HYZAAR) 50-12.5 MG tablet Take 0.5 tablet (25-6.25 mg) by mouth once  daily   metFORMIN (GLUCOPHAGE-XR) 500 MG 24 hr tablet TAKE 1 TABLET(500 MG) BY MOUTH DAILY WITH BREAKFAST   nitroGLYCERIN (NITROSTAT) 0.4 MG SL tablet Place 1 tablet (0.4 mg total) under the tongue every 5 (five) minutes as needed for chest pain (up to 3 doses. If taking 3rd dose call 911).   potassium chloride SA (KLOR-CON) 20 MEQ tablet Take 1 tablet (20 mEq total) by mouth daily.   propranolol (INDERAL) 10 MG tablet Take 1 tablet (10 mg) by  mouth every 1 hour x 3 doses as needed for chest pain/ fast heart rates   No facility-administered encounter medications on file as of 06/04/2021.    Patient Active Problem List   Diagnosis Date Noted   Grief reaction 05/08/2021   Elevated TSH 11/05/2020   Allergy to statin medication 03/26/2020   History of loop recorder 09/06/2019   History of seizure 03/23/2019   Breast lump on left side at 10 o'clock position 11/17/2018   Left-sided epistaxis 11/17/2018   Medicare annual wellness visit, subsequent 09/13/2018   Screening mammogram, encounter for 09/13/2018   Herpes zoster 08/10/2018   Plantar wart, left foot 06/15/2018   Coronary artery disease 12/24/2017   Routine general medical examination at a health care facility 08/24/2016   PAF (paroxysmal atrial fibrillation) ([place]) 04/07/2016   Overweight (BMI 25.0-29.9) 08/23/2014   Type 2 diabetes mellitus with diabetic peripheral angiopathy without gangrene, without long-term current use of insulin ([place]) 01/12/2014   TIA (transient ischemic attack) 01/11/2014   History of CVA (cerebrovascular accident) 01/11/2014   LOOP Recorder LINQ 06/07/2013   Sinusitis, chronic 05/16/2013   H/O: CVA (cerebrovascular accident) 02/15/2013   Encounter for Medicare annual wellness exam 10/27/2012   Encopresis(307.7) 12/15/2011   Post-menopausal 10/28/2011   PERSONAL HX COLONIC POLYPS 09/04/2009   B12 deficiency 03/06/2009   ALLERGIC RHINITIS 02/14/2008   BACK PAIN, LUMBAR 11/03/2007   Hyperlipidemia associated with type 2 diabetes mellitus ([place]) 11/24/2006   Essential hypertension 11/24/2006   GERD 10/09/2006   OVERACTIVE BLADDER 10/09/2006   INCONTINENCE, URGE 10/09/2006   SKIN CANCER, HX OF 10/09/2006    Conditions to be addressed/monitored:Atrial Fibrillation, CAD, and HTN  Care Plan : [facility] plan of care  Updates made by Dannielle Karvonen, RN since 06/04/2021 12:00 AM   Problem: Chronic disease management, education, and/ or care  coordination needs.   Priority: High   Long-Range Goal: Development of plan of care to address chronic disease managment and/ or care coordination needs   Start Date: 06/04/2021  Expected End Date: 09/03/2021  Priority: High  Current Barriers:  Knowledge Deficits related to plan of care for management of Atrial Fibrillation, CAD, and HTN  Chronic Disease Management support and education needs related to Atrial Fibrillation, CAD, and HTN Patient  states last primary care provider visit was 05/08/2021. Reports no changes in treatment plan.  Patient states blood pressure 130/66 with pulse ranging from 50's to 70's.  Patient states she continues to manage the recent loss of her daughter and nephew. She states she has support from her church family and friends. Denies need for grief counseling at this time.  Patient encouraged to contact RNCM if she feels grief counseling needed in the future.  Patient verbalized understanding.   states she continues to deal with the recent death of her daughter and her sister's oldest son past away 1 week ago.  RNCM allowed patient opportunity to express her feelings.  Encouraged patient to continue to monitor pulse/ bloood pressure  and report any concerns to her provider.   RNCM Clinical Goal(s):  Patient will verbalize basic understanding of Atrial Fibrillation, CAD, and HTN disease process and self health management plan as evidenced by patient self report and/ or notation in chart take all medications exactly as prescribed and will call provider for medication related questions as evidenced by patient report and/ or notation in chart    attend all scheduled medical appointments:   as evidenced by patient report and/ or notation in chart        continue to work with RN Care Manager and/or Social Worker to address care management and care coordination needs related to Atrial Fibrillation, CAD, and HTN as evidenced by adherence to CM Team Scheduled appointments     through  collaboration with RN Care manager, provider, and care team.   Interventions: 1:1 collaboration with primary care provider regarding development and update of comprehensive plan of care as evidenced by provider attestation and co-signature Inter-disciplinary care team collaboration (see longitudinal plan of care) Evaluation of current treatment plan related to  self management and patient's adherence to plan as established by provider   AFIB Interventions: (Status:  Goal on track:  Yes.) Long Term Goal   Reviewed importance of adherence to anticoagulant exactly as prescribed Counseled on bleeding risk associated with Eliquis and importance of self-monitoring for signs/symptoms of bleeding Counseled on avoidance of NSAIDs due to increased bleeding risk with anticoagulants Counseled on importance of regular laboratory monitoring as prescribed   CAD Interventions: (Status:  Goal on track:  Yes.) Long Term Goal Assessed understanding of CAD diagnosis Medications reviewed including medications utilized in CAD treatment plan Provided education on importance of blood pressure control in management of CAD Reviewed Importance of taking all medications as prescribed Reviewed Importance of attending all scheduled provider appointments   Hypertension Interventions:  (Status:  Goal on track:  Yes.) Long Term Goal Last practice recorded BP readings:  BP Readings from Last 3 Encounters:  05/08/21 128/84  11/29/20 130/68  11/05/20 (!) 116/92  Most recent eGFR/CrCl: No results found for: EGFR  No components found for: CRCL  Evaluation of current treatment plan related to hypertension self management and patient's adherence to plan as established by provider Reviewed medications with patient and discussed importance of compliance Discussed plans with patient for ongoing care management follow up and provided patient with direct contact information for care management team Reviewed scheduled/upcoming  provider appointments including:    Patient Goals/Self-Care Activities: - check pulse (heart) rate once a day  - Continue to monitor your blood pressure at least 1-2 times per week and record -  Continue to take your medications as prescribed and refill timely - follow up with your providers as recommended - Notify your doctor of any new or ongoing symptoms - Follow a low salt, heart healthy diet - get enough sleep -contact your RN case manager for social work follow up for grief counseling if needed.     Plan:The patient has been provided with contact information for the care management team and has been advised to call with any health related questions or concerns.  The care management team will reach out to the patient again over the next 2-3 months. Quinn Plowman RN,BSN,CCM RN Case Manager [place]  [phone]

## 2021-06-05 DIAGNOSIS — I2510 Atherosclerotic heart disease of native coronary artery without angina pectoris: Secondary | ICD-10-CM

## 2021-06-05 DIAGNOSIS — I480 Paroxysmal atrial fibrillation: Secondary | ICD-10-CM

## 2021-06-05 DIAGNOSIS — Z7901 Long term (current) use of anticoagulants: Secondary | ICD-10-CM

## 2021-06-05 DIAGNOSIS — I10 Essential (primary) hypertension: Secondary | ICD-10-CM | POA: Diagnosis not present

## 2021-06-07 ENCOUNTER — Other Ambulatory Visit: Payer: Self-pay

## 2021-06-07 MED ORDER — LOSARTAN POTASSIUM-HCTZ 50-12.5 MG PO TABS
ORAL_TABLET | ORAL | 0 refills | Status: DC
Start: 2021-06-07 — End: 2021-09-10

## 2021-06-07 NOTE — Telephone Encounter (Signed)
This is a [facility] pt 

## 2021-06-10 ENCOUNTER — Telehealth: Payer: Self-pay | Admitting: Internal Medicine

## 2021-06-10 NOTE — Telephone Encounter (Signed)
-----   Message from Emily Filbert, RN sent at 06/10/2021 12:51 PM EST ----- Regarding: RE: needs appt Please just schedule for follow up next available. No rush!  Thank you!  ----- Message ----- From: Clarisse Gouge Sent: 06/10/2021  11:23 AM EST To: Emily Filbert, RN, # Subject: FW: needs appt                                 Patient  ETT in august .  Please advise as avs states pending ETT to determine fu ----- Message ----- From: Raelene Bott, Brandy L Sent: 06/07/2021  11:09 AM EST To: [facility] [place] Scheduling Subject: needs appt                                     Please schedule F/U appt-patient also did not have ETT. Thanks!

## 2021-06-10 NOTE — Telephone Encounter (Signed)
Attempted to schedule.  LMOV to call office.  ° °

## 2021-06-12 DIAGNOSIS — C44622 Squamous cell carcinoma of skin of right upper limb, including shoulder: Secondary | ICD-10-CM | POA: Diagnosis not present

## 2021-06-12 DIAGNOSIS — D0461 Carcinoma in situ of skin of right upper limb, including shoulder: Secondary | ICD-10-CM | POA: Diagnosis not present

## 2021-06-18 ENCOUNTER — Ambulatory Visit
Admission: RE | Admit: 2021-06-18 | Discharge: 2021-06-18 | Disposition: A | Discharge status: Home / Self Care | Payer: HMO | Source: Ambulatory Visit | Attending: Family Medicine | Admitting: Family Medicine

## 2021-06-18 DIAGNOSIS — Z1231 Encounter for screening mammogram for malignant neoplasm of breast: Secondary | ICD-10-CM

## 2021-06-19 ENCOUNTER — Other Ambulatory Visit: Payer: Self-pay | Admitting: Family Medicine

## 2021-07-05 DIAGNOSIS — L03115 Cellulitis of right lower limb: Secondary | ICD-10-CM | POA: Diagnosis not present

## 2021-07-19 ENCOUNTER — Other Ambulatory Visit: Payer: Self-pay | Admitting: *Deleted

## 2021-07-19 MED ORDER — ISOSORBIDE MONONITRATE ER 60 MG PO TB24: ORAL_TABLET | ORAL | 3 refills | Status: DC

## 2021-08-08 ENCOUNTER — Encounter: Payer: Self-pay | Admitting: Internal Medicine

## 2021-08-08 ENCOUNTER — Ambulatory Visit: Payer: HMO | Admitting: Internal Medicine

## 2021-08-08 ENCOUNTER — Other Ambulatory Visit: Payer: Self-pay

## 2021-08-08 VITALS — BP 130/64 | HR 57 | Ht 68.0 in | Wt 189.1 lb

## 2021-08-08 DIAGNOSIS — I4589 Other specified conduction disorders: Secondary | ICD-10-CM | POA: Diagnosis not present

## 2021-08-08 DIAGNOSIS — R001 Bradycardia, unspecified: Secondary | ICD-10-CM

## 2021-08-08 DIAGNOSIS — R072 Precordial pain: Secondary | ICD-10-CM | POA: Diagnosis not present

## 2021-08-08 DIAGNOSIS — Z79899 Other long term (current) drug therapy: Secondary | ICD-10-CM | POA: Diagnosis not present

## 2021-08-08 DIAGNOSIS — I10 Essential (primary) hypertension: Secondary | ICD-10-CM

## 2021-08-08 DIAGNOSIS — I480 Paroxysmal atrial fibrillation: Secondary | ICD-10-CM | POA: Diagnosis not present

## 2021-08-08 DIAGNOSIS — I639 Cerebral infarction, unspecified: Secondary | ICD-10-CM | POA: Diagnosis not present

## 2021-08-08 NOTE — Patient Instructions (Signed)
Medication Instructions:  - Your physician recommends that you continue on your current medications as directed. Please refer to the Current Medication list given to you today.  *If you need a refill on your cardiac medications before your next appointment, please call your pharmacy*   Lab Work: - Your physician recommends that you have lab work today: CBC   If you have labs (blood work) drawn today and your tests are completely normal, you will receive your results only by: MyChart Message (if you have MyChart) OR A paper copy in the mail If you have any lab test that is abnormal or we need to change your treatment, we will call you to review the results.   Testing/Procedures: - Your physician has requested that you have a lexiscan myoview.   [place]  Your caregiver has ordered a Stress Test with nuclear imaging. The purpose of this test is to evaluate the blood supply to your heart muscle. This procedure is referred to as a "Non-Invasive Stress Test." This is because other than having an IV started in your vein, nothing is inserted or "invades" your body. Cardiac stress tests are done to find areas of poor blood flow to the heart by determining the extent of coronary artery disease (CAD). Some patients exercise on a treadmill, which naturally increases the blood flow to your heart, while others who are  unable to walk on a treadmill due to physical limitations have a pharmacologic/chemical stress agent called Lexiscan . This medicine will mimic walking on a treadmill by temporarily increasing your coronary blood flow.   Please note: these test may take anywhere between 2-4 hours to complete  PLEASE REPORT TO [facility] AT THE FIRST DESK WILL DIRECT YOU WHERE TO GO  Date of Procedure:_____________________________________  Arrival Time for Procedure:______________________________  Instructions regarding medication:   __x__ : Hold diabetes  medication morning of procedure (GLIPIZIDE & METFORMIN)   __x__:  You may take all of your other regular medications the morning of your test with enough water to get them down safely   PLEASE NOTIFY THE OFFICE AT LEAST 24 HOURS IN ADVANCE IF YOU ARE UNABLE TO [place].  [phone] AND  PLEASE NOTIFY NUCLEAR MEDICINE AT [facility] AT LEAST 24 HOURS IN ADVANCE IF YOU ARE UNABLE TO KEEP YOUR APPOINTMENT. [phone]  How to prepare for your Myoview test:  Do not eat or drink after midnight No caffeine for 24 hours prior to test No smoking 24 hours prior to test. Your medication may be taken with water.  If your doctor stopped a medication because of this test, do not take that medication. Ladies, please do not wear dresses.  Skirts or pants are appropriate. Please wear a short sleeve shirt. No perfume, cologne or lotion. Wear comfortable walking shoes. No heels!     Follow-Up: At [facility], you and your health needs are our priority.  As part of our continuing mission to provide you with exceptional heart care, we have created designated Provider Care Teams.  These Care Teams include your primary Cardiologist (physician) and Advanced Practice Providers (APPs -  Physician Assistants and Nurse Practitioners) who all work together to provide you with the care you need, when you need it.  We recommend signing up for the patient portal called "MyChart".  Sign up information is provided on this After Visit Summary.  MyChart is used to connect with patients for Virtual Visits (Telemedicine).  Patients are able to view  lab/test results, encounter notes, upcoming appointments, etc.  Non-urgent messages can be sent to your provider as well.   To learn more about what you can do with MyChart, go to [email or web address].    Your next appointment:   6 month(s) as long as the results of your stress test are ok  The format for your next appointment:   In Person  Provider:   Virl Axe, MD    Other Instructions  Cardiac Nuclear Scan A cardiac nuclear scan is a test that is done to check the flow of blood to your heart. It is done when you are resting and when you are exercising. The test looks for problems such as: Not enough blood reaching a portion of the heart. The heart muscle not working as it should. You may need this test if: You have heart disease. You have had lab results that are not normal. You have had heart surgery or a balloon procedure to open up blocked arteries (angioplasty). You have chest pain. You have shortness of breath. In this test, a special dye (tracer) is put into your bloodstream. The tracer will travel to your heart. A camera will then take pictures of your heart to see how the tracer moves through your heart. This test is usually done at a hospital and takes 2-4 hours. Tell a doctor about: Any allergies you have. All medicines you are taking, including vitamins, herbs, eye drops, creams, and over-the-counter medicines. Any problems you or family members have had with anesthetic medicines. Any blood disorders you have. Any surgeries you have had. Any medical conditions you have. Whether you are pregnant or may be pregnant. What are the risks? Generally, this is a safe test. However, problems may occur, such as: Serious chest pain and heart attack. This is only a risk if the stress portion of the test is done. Rapid heartbeat. A feeling of warmth in your chest. This feeling usually does not last long. Allergic reaction to the tracer. What happens before the test? Ask your doctor about changing or stopping your normal medicines. This is important. Follow instructions from your doctor about what you cannot eat or drink. Remove your jewelry on the day of the test. What happens during the test? An IV tube will be inserted into one of your veins. Your doctor will give you a small amount of tracer through the IV tube. You will wait  for 20-40 minutes while the tracer moves through your bloodstream. Your heart will be monitored with an electrocardiogram (ECG). You will lie down on an exam table. Pictures of your heart will be taken for about 15-20 minutes. You may also have a stress test. For this test, one of these things may be done: You will be asked to exercise on a treadmill or a stationary bike. You will be given medicines that will make your heart work harder. This is done if you are unable to exercise. When blood flow to your heart has peaked, a tracer will again be given through the IV tube. After 20-40 minutes, you will get back on the exam table. More pictures will be taken of your heart. Depending on the tracer that is used, more pictures may need to be taken 3-4 hours later. Your IV tube will be removed when the test is over. The test may vary among doctors and hospitals. What happens after the test? Ask your doctor: Whether you can return to your normal schedule, including diet, activities, and medicines. Whether you  should drink more fluids. This will help to remove the tracer from your body. Drink enough fluid to keep your pee (urine) pale yellow. Ask your doctor, or the department that is doing the test: When will my results be ready? How will I get my results? Summary A cardiac nuclear scan is a test that is done to check the flow of blood to your heart. Tell your doctor whether you are pregnant or may be pregnant. Before the test, ask your doctor about changing or stopping your normal medicines. This is important. Ask your doctor whether you can return to your normal activities. You may be asked to drink more fluids. This information is not intended to replace advice given to you by your health care provider. Make sure you discuss any questions you have with your health care provider. Document Revised: 03/06/2021 Document Reviewed: 12/05/2020 Elsevier Patient Education  [place].

## 2021-08-08 NOTE — Progress Notes (Signed)
Patient Care Team: Tower, [name], MD as PCP - General (Family Medicine) Deboraha Sprang, MD as PCP - Cardiology (Cardiology) Deboraha Sprang, MD as PCP - Electrophysiology (Cardiology) Dasher, Rayvon Char, MD as Consulting Physician (Dermatology) Salomon Fick., MD as Referring Physician (Dentistry) Leandrew Koyanagi, MD as Referring Physician (Ophthalmology) Dannielle Karvonen, RN as Case Manager   HPI  Margaret Vazquez is a 86 y.o. female seen in follow-up for chronotropic incompetence and exercise intolerance.  Peak heart rate in the office was 73.  Has been deferring a discussion of pacing for almost a year.  Undertook modified treadmill 8/22 heart rate 64--104  Initially followed  for a loop recorder implanted for cryptogenic stroke.  Atrial fibrillation identified now on anticoagulation with apixaban.  2019 coronary artery disease-CT>> abnormal FFR; circumflex was stented       She has Carotid disease but has been intolerant of statins and has been referred for PCSK9 therapy  The patient denies shortness of breath, nocturnal dyspnea, orthopnea or peripheral edema.  There have been no palpitations, lightheadedness or syncope.  Complains of episodic chest pains 5-10 over the last day.  Fleeting lasting 1-3 seconds; unassociated with any changes with effort tolerance and not provoked by effort.  She then mentions that she has a broken heart, that her daughter [name], died in April 28, 2023 of a fentanyl overdose  DATE TEST EF   6/19 LHC 55-65% CXp/m-80>>stent RCAm 30%  10/21 Echo   65-70 %         Date Cr K TSH Hgb  9/21 0.98 3.9  15.0   3/22 0.9 4.2 5.14 14.3  10/22 1.1 4.0 4.16    Thromboembolic risk factors ( age  -2, HTN-1, TIA/CVA-2, Vasc disease -1, Gender-1) for a CHADSVASc Score of >=7      Past Medical History:  Diagnosis Date   Allergy history, drug    Aspirin   Basal cell carcinoma    "back"   CAD (coronary artery disease)    a. Previously  nonobstructive then progressive angina with abnl CT -> cardiac cath 12/24/17 showed 30% mid RCA and 80% prox-mid Cx. She received DES to mid AV groove Cx. EF 55-65%.    Cervical stenosis of spine    With neck pain   CKD (chronic kidney disease), stage II    Colon polyps 3845   Complication of anesthesia 1980s   slow to wake after anesthesia "when I had breast biopsy"   GERD (gastroesophageal reflux disease)    Hyperlipidemia    myalgias with Lipitor and Zetia   Hypertension    Migraine    "stopped at age 68" (12/24/2017)   Squamous carcinoma    "iced off and cut off; mostly arms" (12/24/2017)   Stroke [facility])    "told me I'd had 2 strokes in 02/2013"; denies residual on 12/24/2017   TIA (transient ischemic attack)    Type II diabetes mellitus ([place])     Past Surgical History:  Procedure Laterality Date   ABD [place]  07/2003   Negative   APPENDECTOMY     BASAL CELL CARCINOMA EXCISION     "back"   BREAST BIOPSY Left 1992   benign   CARDIAC CATHETERIZATION  04/07/2011   non obst CAD (Dr Burt Knack)   CATARACT EXTRACTION W/PHACO Left 2017-04-27   Procedure: CATARACT EXTRACTION PHACO AND INTRAOCULAR LENS PLACEMENT ([place]) LEFT DIABETIC;  Surgeon: Leandrew Koyanagi, MD;  Location: [facility];  Service:  Ophthalmology;  Laterality: Left;  Diabetic - oral meds   CATARACT EXTRACTION W/PHACO Right 04/29/2017   Procedure: CATARACT EXTRACTION PHACO AND INTRAOCULAR LENS PLACEMENT ([place]) RIGHT DIABETIC;  Surgeon: Leandrew Koyanagi, MD;  Location: [facility];  Service: Ophthalmology;  Laterality: Right;  Diabetic - oral meds   COLONOSCOPY  12/2007   Adenomatous colon polyps   CORONARY ANGIOPLASTY WITH STENT PLACEMENT  12/24/2017   CORONARY STENT INTERVENTION N/A 12/24/2017   Procedure: CORONARY STENT INTERVENTION;  Surgeon: Lorretta Harp, MD;  Location: [facility];  Service: Cardiovascular;  Laterality: N/A;   CYSTOSCOPY W/ DECANNULATION  03/2000   Normal   LEFT HEART CATH  AND CORONARY ANGIOGRAPHY N/A 12/24/2017   Procedure: LEFT HEART CATH AND CORONARY ANGIOGRAPHY;  Surgeon: Lorretta Harp, MD;  Location: [facility];  Service: Cardiovascular;  Laterality: N/A;   LOOP RECORDER IMPLANT N/A 02/16/2013   Procedure: LOOP RECORDER IMPLANT;  Surgeon: Deboraha Sprang, MD;  Location[facility] CATH LAB;  Service: Cardiovascular;  Laterality: N/A;   MOHS SURGERY     right hand    NASAL SINUS SURGERY  01/2005   SQUAMOUS CELL CARCINOMA EXCISION     "mostly arms;" (12/24/2017)   STRESS CARDIOLITE  11/1999   Normal/ negative   TEAR DUCT PROBING  2005   "? side"   TEE WITHOUT CARDIOVERSION N/A 02/16/2013   Procedure: TRANSESOPHAGEAL ECHOCARDIOGRAM (TEE);  Surgeon: Josue Hector, MD;  Location: [facility] ENDOSCOPY;  Service: Cardiovascular;  Laterality: N/A;   TUBAL LIGATION     BTL    Current Outpatient Medications  Medication Sig Dispense Refill   apixaban (ELIQUIS) 5 MG TABS tablet Take 1 tablet (5 mg total) by mouth 2 (two) times daily. 60 tablet 5   Blood Glucose Monitoring Suppl (ONE TOUCH ULTRA 2) w/Device KIT Use to check blood sugar once daily 1 each 0   Cyanocobalamin (VITAMIN B-12 PO) Take 1 tablet by mouth daily.     diphenhydrAMINE (BENADRYL) 25 MG tablet Take 50 mg by mouth every 8 (eight) hours as needed for allergies.      EPINEPHrine (EPI-PEN) 0.3 mg/0.3 mL DEVI Inject 0.3 mg into the muscle daily as needed (allergic reaction).      fluticasone (FLONASE) 50 MCG/ACT nasal spray Place 2 sprays into both nostrils 2 (two) times daily as needed. Reported on 09/10/2015     glipiZIDE (GLUCOTROL XL) 10 MG 24 hr tablet TAKE 1 TABLET(10 MG) BY MOUTH DAILY WITH BREAKFAST 90 tablet 1   glucose blood (ONETOUCH ULTRA) test strip USE TO CHECK BLOOD SUGAR ONCE DAILY (DX CODE: E11.51) 100 each 1   isosorbide mononitrate (IMDUR) 60 MG 24 hr tablet TAKE 1 TABLET(60 MG) BY MOUTH DAILY 90 tablet 3   losartan-hydrochlorothiazide (HYZAAR) 50-12.5 MG tablet Take 0.5 tablet (25-6.25 mg)  by mouth once daily PLEASE SCHEDULE OFFICE VISIT FOR FURTHER REFILLS. THANK YOU! 45 tablet 0   metFORMIN (GLUCOPHAGE-XR) 500 MG 24 hr tablet TAKE 1 TABLET(500 MG) BY MOUTH DAILY WITH BREAKFAST 90 tablet 3   nitroGLYCERIN (NITROSTAT) 0.4 MG SL tablet Place 1 tablet (0.4 mg total) under the tongue every 5 (five) minutes as needed for chest pain (up to 3 doses. If taking 3rd dose call 911). 25 tablet 3   potassium chloride SA (KLOR-CON) 20 MEQ tablet Take 1 tablet (20 mEq total) by mouth daily. 90 tablet 3   propranolol (INDERAL) 10 MG tablet Take 1 tablet (10 mg) by mouth every 1 hour x 3  doses as needed for chest pain/ fast heart rates 30 tablet 1   No current facility-administered medications for this visit.    Allergies  Allergen Reactions   Bee Venom Hives, Shortness Of Breath and Swelling   Nabumetone Anaphylaxis   Amoxicillin-Pot Clavulanate Hives and Swelling    To lips.   Aspirin Hives   Atorvastatin Swelling     joint pain/swelling, inc liver tests   Clopidogrel Bisulfate Hives   Codeine Nausea And Vomiting   Ezetimibe Other (See Comments)     fatigue   Metformin And Related Other (See Comments)    Diarrhea    Valsartan Other (See Comments)     fatigue   Other Hives    **Red Meat**  SOB    Review of Systems negative except from HPI and PMH  Physical Exam BP 130/64 (BP Location: Left Arm, Patient Position: Sitting, Cuff Size: Large)    Pulse (!) 57    Ht 5' 8"  (1.727 m)    Wt 189 lb 2 oz (85.8 kg)    SpO2 97%    BMI 28.76 kg/m  Well developed and nourished in no acute distress HENT normal Neck supple with JVP-  flat   Clear Chest wall tenderness but different from her complaints Regular rate and rhythm, no murmurs or gallops Abd-soft with active BS No Clubbing cyanosis edema Skin-warm and dry A & Oriented  Grossly normal sensory and motor function  ECG sinus at 57 Interval 16/08/40  Assessment and  Plan  Cryptogenic stroke  Implantable loop recorder end of  service  Elevated blood pressure  Coronary disease s/p Cx stenting  Dyspnea on exertion  Exertional chest pain consistent with angina-new  Atrial fibrillation paroxysmal   Sinus bradycardia w chronotropic incompetence    no significant atrial fibrillation.  We will continue to uses apixaban 5 mg twice daily Propranolol 10 mg for recurrences  Chest pain seems noncardiac that was tender history of coronary disease.  Angina well controlled with isosorbide 60.  We will however get a Myoview.  Blood pressure is also well controlled and will continue losartan HCT 25/6.25

## 2021-08-09 LAB — CBC
Hematocrit: 42.5 % (ref 34.0–46.6)
Hemoglobin: 14.4 g/dL (ref 11.1–15.9)
MCH: 31.5 pg (ref 26.6–33.0)
MCHC: 33.9 g/dL (ref 31.5–35.7)
MCV: 93 fL (ref 79–97)
Platelets: 238 10*3/uL (ref 150–450)
RBC: 4.57 x10E6/uL (ref 3.77–5.28)
RDW: 12.3 % (ref 11.7–15.4)
WBC: 8.4 10*3/uL (ref 3.4–10.8)

## 2021-08-14 ENCOUNTER — Encounter
Admission: RE | Admit: 2021-08-14 | Discharge: 2021-08-14 | Disposition: A | Discharge status: Home / Self Care | Payer: HMO | Source: Ambulatory Visit | Attending: Internal Medicine | Admitting: Internal Medicine

## 2021-08-14 ENCOUNTER — Other Ambulatory Visit: Payer: Self-pay | Admitting: Medical

## 2021-08-14 DIAGNOSIS — R072 Precordial pain: Secondary | ICD-10-CM

## 2021-08-14 DIAGNOSIS — I2510 Atherosclerotic heart disease of native coronary artery without angina pectoris: Secondary | ICD-10-CM | POA: Diagnosis not present

## 2021-08-14 DIAGNOSIS — E118 Type 2 diabetes mellitus with unspecified complications: Secondary | ICD-10-CM

## 2021-08-14 LAB — NM MYOCAR MULTI W/SPECT W/WALL MOTION / EF
LV dias vol: 39 mL (ref 46–106)
LV sys vol: 9 mL
MPHR: 137 {beats}/min
Nuc Stress EF: 77 %
Peak HR: 82 {beats}/min
Percent HR: 59 %
Rest HR: 56 {beats}/min
Rest Nuclear Isotope Dose: 10.3 mCi
SDS: 6
SRS: 1
SSS: 5
ST Depression (mm): 0 mm
Stress Nuclear Isotope Dose: 33 mCi
TID: 0.84

## 2021-08-14 LAB — GLUCOSE, CAPILLARY: Glucose-Capillary: 107 mg/dL — ABNORMAL HIGH (ref 70–99)

## 2021-08-14 MED ORDER — REGADENOSON 0.4 MG/5ML IV SOLN
0.4000 mg | Freq: Once | INTRAVENOUS | Status: AC
  Administered 2021-08-14: 0.4 mg via INTRAVENOUS

## 2021-08-14 MED ORDER — TECHNETIUM TC 99M TETROFOSMIN IV KIT
10.0000 | PACK | Freq: Once | INTRAVENOUS | Status: AC | PRN
  Administered 2021-08-14: 10.3 via INTRAVENOUS

## 2021-08-14 MED ORDER — TECHNETIUM TC 99M TETROFOSMIN IV KIT
30.0000 | PACK | Freq: Once | INTRAVENOUS | Status: AC
  Administered 2021-08-14: 33 via INTRAVENOUS

## 2021-08-14 NOTE — Progress Notes (Signed)
Myoview Lexiscan. Post procedure patient felt weak and shaky, suspected due to low BP. Will order BG.

## 2021-08-19 ENCOUNTER — Telehealth: Payer: Self-pay

## 2021-08-19 NOTE — Progress Notes (Signed)
This encounter was created in error - please disregard.

## 2021-08-19 NOTE — Telephone Encounter (Signed)
°  Care Management   Follow Up Note   08/19/2021 Name: Margaret Vazquez MRN: [id number] DOB: 11-Jan-1936   Referred by: Tower, [name], MD Reason for referral : Chronic Care Management   An unsuccessful telephone outreach was attempted today. The patient was referred to the case management team for assistance with care management and care coordination.   Follow Up Plan: A HIPPA compliant phone message was left for the patient providing contact information and requesting a return call.   Quinn Plowman RN,BSN,CCM RN Case Manager [place]  [phone]

## 2021-08-27 ENCOUNTER — Ambulatory Visit (INDEPENDENT_AMBULATORY_CARE_PROVIDER_SITE_OTHER): Payer: HMO

## 2021-08-27 DIAGNOSIS — I480 Paroxysmal atrial fibrillation: Secondary | ICD-10-CM

## 2021-08-27 DIAGNOSIS — I10 Essential (primary) hypertension: Secondary | ICD-10-CM

## 2021-08-27 DIAGNOSIS — I2510 Atherosclerotic heart disease of native coronary artery without angina pectoris: Secondary | ICD-10-CM

## 2021-08-27 DIAGNOSIS — F4321 Adjustment disorder with depressed mood: Secondary | ICD-10-CM

## 2021-08-27 NOTE — Chronic Care Management (AMB) (Signed)
Chronic Care Management   CCM RN Visit Note  08/27/2021 Name: Margaret Vazquez MRN: [id number] DOB: June 27, 1936  Subjective: Margaret Vazquez is a 86 y.o. year old female who is a primary care patient of Tower, [name], MD. The care management team was consulted for assistance with disease management and care coordination needs.    Engaged with patient by telephone for follow up visit in response to provider referral for case management and/or care coordination services.   Consent to Services:  The patient was given information about Chronic Care Management services, agreed to services, and gave verbal consent prior to initiation of services.  Please see initial visit note for detailed documentation.   Patient agreed to services and verbal consent obtained.   Assessment: Review of patient past medical history, allergies, medications, health status, including review of consultants reports, laboratory and other test data, was performed as part of comprehensive evaluation and provision of chronic care management services.   SDOH (Social Determinants of Health) assessments and interventions performed:    CCM Care Plan  Allergies  Allergen Reactions   Bee Venom Hives, Shortness Of Breath and Swelling   Nabumetone Anaphylaxis   Amoxicillin-Pot Clavulanate Hives and Swelling    To lips.   Aspirin Hives   Atorvastatin Swelling     joint pain/swelling, inc liver tests   Clopidogrel Bisulfate Hives   Codeine Nausea And Vomiting   Ezetimibe Other (See Comments)     fatigue   Metformin And Related Other (See Comments)    Diarrhea    Valsartan Other (See Comments)     fatigue   Other Hives    **Red Meat**  SOB    Outpatient Encounter Medications as of 08/27/2021  Medication Sig   apixaban (ELIQUIS) 5 MG TABS tablet Take 1 tablet (5 mg total) by mouth 2 (two) times daily.   Cyanocobalamin (VITAMIN B-12 PO) Take 1 tablet by mouth daily.   diphenhydrAMINE (BENADRYL) 25 MG tablet Take 50 mg  by mouth every 8 (eight) hours as needed for allergies.    fluticasone (FLONASE) 50 MCG/ACT nasal spray Place 2 sprays into both nostrils 2 (two) times daily as needed. Reported on 09/10/2015   glipiZIDE (GLUCOTROL XL) 10 MG 24 hr tablet TAKE 1 TABLET(10 MG) BY MOUTH DAILY WITH BREAKFAST   isosorbide mononitrate (IMDUR) 60 MG 24 hr tablet TAKE 1 TABLET(60 MG) BY MOUTH DAILY   losartan-hydrochlorothiazide (HYZAAR) 50-12.5 MG tablet Take 0.5 tablet (25-6.25 mg) by mouth once daily PLEASE SCHEDULE OFFICE VISIT FOR FURTHER REFILLS. THANK YOU!   metFORMIN (GLUCOPHAGE-XR) 500 MG 24 hr tablet TAKE 1 TABLET(500 MG) BY MOUTH DAILY WITH BREAKFAST   nitroGLYCERIN (NITROSTAT) 0.4 MG SL tablet Place 1 tablet (0.4 mg total) under the tongue every 5 (five) minutes as needed for chest pain (up to 3 doses. If taking 3rd dose call 911).   potassium chloride SA (KLOR-CON) 20 MEQ tablet Take 1 tablet (20 mEq total) by mouth daily.   propranolol (INDERAL) 10 MG tablet Take 1 tablet (10 mg) by mouth every 1 hour x 3 doses as needed for chest pain/ fast heart rates   Blood Glucose Monitoring Suppl (ONE TOUCH ULTRA 2) w/Device KIT Use to check blood sugar once daily   EPINEPHrine (EPI-PEN) 0.3 mg/0.3 mL DEVI Inject 0.3 mg into the muscle daily as needed (allergic reaction).  (Patient not taking: Reported on 08/27/2021)   glucose blood (ONETOUCH ULTRA) test strip USE TO CHECK BLOOD SUGAR ONCE DAILY (DX CODE: E11.51)  No facility-administered encounter medications on file as of 08/27/2021.    Patient Active Problem List   Diagnosis Date Noted   Grief reaction 05/08/2021   Elevated TSH 11/05/2020   Allergy to statin medication 03/26/2020   History of loop recorder 09/06/2019   History of seizure 03/23/2019   Breast lump on left side at 10 o'clock position 11/17/2018   Left-sided epistaxis 11/17/2018   Medicare annual wellness visit, subsequent 09/13/2018   Screening mammogram, encounter for 09/13/2018   Herpes zoster  08/10/2018   Plantar wart, left foot 06/15/2018   Coronary artery disease 12/24/2017   Routine general medical examination at a health care facility 08/24/2016   PAF (paroxysmal atrial fibrillation) ([place]) 04/07/2016   Overweight (BMI 25.0-29.9) 08/23/2014   Type 2 diabetes mellitus with diabetic peripheral angiopathy without gangrene, without long-term current use of insulin ([place]) 01/12/2014   TIA (transient ischemic attack) 01/11/2014   History of CVA (cerebrovascular accident) 01/11/2014   LOOP Recorder LINQ 06/07/2013   Sinusitis, chronic 05/16/2013   H/O: CVA (cerebrovascular accident) 02/15/2013   Encounter for Medicare annual wellness exam 10/27/2012   Encopresis(307.7) 12/15/2011   Post-menopausal 10/28/2011   PERSONAL HX COLONIC POLYPS 09/04/2009   B12 deficiency 03/06/2009   ALLERGIC RHINITIS 02/14/2008   BACK PAIN, LUMBAR 11/03/2007   Hyperlipidemia associated with type 2 diabetes mellitus ([place]) 11/24/2006   Essential hypertension 11/24/2006   GERD 10/09/2006   OVERACTIVE BLADDER 10/09/2006   INCONTINENCE, URGE 10/09/2006   SKIN CANCER, HX OF 10/09/2006    Conditions to be addressed/monitored:Atrial Fibrillation, CAD, HTN, and Grief  Care Plan : [facility] plan of care  Updates made by Dannielle Karvonen, RN since 08/27/2021 12:00 AM   Problem: Chronic disease management, education, and/ or care coordination needs.   Priority: High   Long-Range Goal: Development of plan of care to address chronic disease managment and/ or care coordination needs   Start Date: 06/04/2021  Expected End Date: 11/01/2021  Priority: High  Current Barriers:  Knowledge Deficits related to plan of care for management of Atrial Fibrillation, CAD, and HTN  Chronic Disease Management support and education needs related to Atrial Fibrillation, CAD, and HTN Patient  states she is doing ok.  Reports having visit with cardiologist on 08/08/21.  She states she had a stress test and was notified by the office  that it looked good.  Patient states she is to follow up with cardiologist in 6 months.   Patient reminded of her annual wellness visit schedule for 11/07/21.  Patient states she continues to check her blood pressure, pulse and blood sugars daily.  Patients reported results:  147/76, P 54,  130/75 P 65,  148/79, P 65,  157/83 P 61, 133/76 P 58.   Patient reports blood sugars have ranged from 95 to 113 fasting over the last 2 weeks. Patient denies any blood sugars < 70.     RNCM Clinical Goal(s):  Patient will verbalize basic understanding of Atrial Fibrillation, CAD, and HTN disease process and self health management plan as evidenced by patient self report and/ or notation in chart take all medications exactly as prescribed and will call provider for medication related questions as evidenced by patient report and/ or notation in chart    attend all scheduled medical appointments:   as evidenced by patient report and/ or notation in chart        continue to work with RN Care Manager and/or Social Worker to address care management and care coordination needs  related to Atrial Fibrillation, CAD, and HTN as evidenced by adherence to CM Team Scheduled appointments     through collaboration with RN Care manager, provider, and care team.   Interventions: 1:1 collaboration with primary care provider regarding development and update of comprehensive plan of care as evidenced by provider attestation and co-signature Inter-disciplinary care team collaboration (see longitudinal plan of care) Evaluation of current treatment plan related to  self management and patient's adherence to plan as established by provider   AFIB Interventions: (Status:  Goal on track:  Yes.) Long Term Goal   Reviewed importance of adherence to anticoagulant exactly as prescribed Counseled on bleeding risk associated with Eliquis and importance of self-monitoring for signs/symptoms of bleeding Advised to continue to monitor pulse rated and  report any concerns to provider.  Counseled on importance of regular laboratory monitoring as prescribed   CAD Interventions: (Status:  Goal on track:  Yes.) Long Term Goal Medications reviewed including medications utilized in CAD treatment plan Provided education on importance of blood pressure control in management of CAD Reviewed Importance of taking all medications as prescribed Reviewed Importance of attending all scheduled provider appointments   Hypertension Interventions:  (Status:  Goal on track:  Yes.) Long Term Goal Last practice recorded BP readings:  BP Readings from Last 3 Encounters:  05/08/21 128/84  11/29/20 130/68  11/05/20 (!) 116/92  Evaluation of current treatment plan related to hypertension self management and patient's adherence to plan as established by provider Reviewed medications with patient and discussed importance of compliance Discussed plans with patient for ongoing care management follow up and provided patient with direct contact information for care management team Reviewed scheduled/upcoming provider appointments  Advised patient to continue to monitor blood pressure daily and record.   Grieving: ( Goal on track: Yes) Long term goal  Patient offered grief counseling follow up with Licensed clinical Education officer, museum.   Patient Goals/Self-Care Activities: -  Continue to check pulse (heart) rate once a day  - Continue to monitor your blood pressure at least 1-2 times per week and record -  Continue to take your medications as prescribed and refill timely - follow up with your providers as recommended - Notify your doctor of any new or ongoing symptoms - Follow a low salt, heart healthy diet -contact your RN case manager for social work follow up for grief counseling if needed.     Plan:The patient has been provided with contact information for the care management team and has been advised to call with any health related questions or concerns.  The  care management team will reach out to the patient again over the next 2 months . Quinn Plowman RN,BSN,CCM RN Case Manager [place]  [phone]

## 2021-08-27 NOTE — Patient Instructions (Signed)
Visit Information  Thank you for taking time to visit with me today. Please don't hesitate to contact me if I can be of assistance to you before our next scheduled telephone appointment.  Following are the goals we discussed today:  -  Continue to check pulse (heart) rate once a day  - Continue to monitor your blood pressure at least 1-2 times per week and record -  Continue to take your medications as prescribed and refill timely - follow up with your providers as recommended - Notify your doctor of any new or ongoing symptoms - Follow a low salt, heart healthy diet -contact your RN case manager for social work follow up for grief counseling if needed.  Our next appointment is by telephone on 10/08/21 at 10 am  Please call the care guide team at [phone] if you need to cancel or reschedule your appointment.   If you are experiencing a Mental Health or [place] or need someone to talk to, please call the Suicide and Crisis Lifeline: 988 call 1-800-273-TALK (toll free, 24 hour hotline)   Patient verbalizes understanding of instructions and care plan provided today and agrees to view in [place]. Active MyChart status confirmed with patient.    Quinn Plowman RN,BSN,CCM RN Case Manager [place]  [phone]

## 2021-09-03 DIAGNOSIS — F4321 Adjustment disorder with depressed mood: Secondary | ICD-10-CM

## 2021-09-03 DIAGNOSIS — I10 Essential (primary) hypertension: Secondary | ICD-10-CM

## 2021-09-03 DIAGNOSIS — I480 Paroxysmal atrial fibrillation: Secondary | ICD-10-CM

## 2021-09-03 DIAGNOSIS — I2510 Atherosclerotic heart disease of native coronary artery without angina pectoris: Secondary | ICD-10-CM | POA: Diagnosis not present

## 2021-09-04 DIAGNOSIS — D692 Other nonthrombocytopenic purpura: Secondary | ICD-10-CM | POA: Diagnosis not present

## 2021-09-04 DIAGNOSIS — E1151 Type 2 diabetes mellitus with diabetic peripheral angiopathy without gangrene: Secondary | ICD-10-CM | POA: Diagnosis not present

## 2021-09-04 DIAGNOSIS — I25118 Atherosclerotic heart disease of native coronary artery with other forms of angina pectoris: Secondary | ICD-10-CM | POA: Diagnosis not present

## 2021-09-04 DIAGNOSIS — Z7984 Long term (current) use of oral hypoglycemic drugs: Secondary | ICD-10-CM | POA: Diagnosis not present

## 2021-09-04 DIAGNOSIS — D6869 Other thrombophilia: Secondary | ICD-10-CM | POA: Diagnosis not present

## 2021-09-04 DIAGNOSIS — Z7982 Long term (current) use of aspirin: Secondary | ICD-10-CM | POA: Diagnosis not present

## 2021-09-04 DIAGNOSIS — I480 Paroxysmal atrial fibrillation: Secondary | ICD-10-CM | POA: Diagnosis not present

## 2021-09-10 ENCOUNTER — Other Ambulatory Visit: Payer: Self-pay

## 2021-09-10 DIAGNOSIS — Z006 Encounter for examination for normal comparison and control in clinical research program: Secondary | ICD-10-CM

## 2021-09-10 MED ORDER — LOSARTAN POTASSIUM-HCTZ 50-12.5 MG PO TABS: ORAL_TABLET | ORAL | 2 refills | Status: DC

## 2021-09-10 NOTE — Research (Signed)
Phone called made to patient for f/u visit for ORION 4 trial. Was unable to make contact and message left for a return call. Updated the [facility] for trial by reviewing medical notes at this time. ?

## 2021-09-18 DIAGNOSIS — S81802A Unspecified open wound, left lower leg, initial encounter: Secondary | ICD-10-CM | POA: Diagnosis not present

## 2021-09-18 DIAGNOSIS — L03116 Cellulitis of left lower limb: Secondary | ICD-10-CM | POA: Diagnosis not present

## 2021-09-24 DIAGNOSIS — Z006 Encounter for examination for normal comparison and control in clinical research program: Secondary | ICD-10-CM

## 2021-09-24 DIAGNOSIS — S81802D Unspecified open wound, left lower leg, subsequent encounter: Secondary | ICD-10-CM | POA: Diagnosis not present

## 2021-09-24 MED ORDER — STUDY - ORION 4 - INCLISIRAN 300 MG/1.5 ML OR PLACEBO SQ INJECTION (PI-STUCKEY): 300.0000 mg | INJECTION | SUBCUTANEOUS | Status: AC

## 2021-10-03 DIAGNOSIS — D2272 Melanocytic nevi of left lower limb, including hip: Secondary | ICD-10-CM | POA: Diagnosis not present

## 2021-10-03 DIAGNOSIS — D2271 Melanocytic nevi of right lower limb, including hip: Secondary | ICD-10-CM | POA: Diagnosis not present

## 2021-10-03 DIAGNOSIS — D0462 Carcinoma in situ of skin of left upper limb, including shoulder: Secondary | ICD-10-CM | POA: Diagnosis not present

## 2021-10-03 DIAGNOSIS — D0439 Carcinoma in situ of skin of other parts of face: Secondary | ICD-10-CM | POA: Diagnosis not present

## 2021-10-03 DIAGNOSIS — C44729 Squamous cell carcinoma of skin of left lower limb, including hip: Secondary | ICD-10-CM | POA: Diagnosis not present

## 2021-10-03 DIAGNOSIS — X32XXXA Exposure to sunlight, initial encounter: Secondary | ICD-10-CM | POA: Diagnosis not present

## 2021-10-03 DIAGNOSIS — L570 Actinic keratosis: Secondary | ICD-10-CM | POA: Diagnosis not present

## 2021-10-03 DIAGNOSIS — D044 Carcinoma in situ of skin of scalp and neck: Secondary | ICD-10-CM | POA: Diagnosis not present

## 2021-10-03 DIAGNOSIS — D2262 Melanocytic nevi of left upper limb, including shoulder: Secondary | ICD-10-CM | POA: Diagnosis not present

## 2021-10-03 DIAGNOSIS — Z85828 Personal history of other malignant neoplasm of skin: Secondary | ICD-10-CM | POA: Diagnosis not present

## 2021-10-03 DIAGNOSIS — D485 Neoplasm of uncertain behavior of skin: Secondary | ICD-10-CM | POA: Diagnosis not present

## 2021-10-08 ENCOUNTER — Ambulatory Visit (INDEPENDENT_AMBULATORY_CARE_PROVIDER_SITE_OTHER): Payer: HMO

## 2021-10-08 DIAGNOSIS — I480 Paroxysmal atrial fibrillation: Secondary | ICD-10-CM

## 2021-10-08 DIAGNOSIS — I10 Essential (primary) hypertension: Secondary | ICD-10-CM

## 2021-10-08 DIAGNOSIS — F4321 Adjustment disorder with depressed mood: Secondary | ICD-10-CM

## 2021-10-08 DIAGNOSIS — I2510 Atherosclerotic heart disease of native coronary artery without angina pectoris: Secondary | ICD-10-CM

## 2021-10-08 NOTE — Patient Instructions (Signed)
Visit Information  Thank you for taking time to visit with me today. Please don't hesitate to contact me if I can be of assistance to you before our next scheduled telephone appointment.  Following are the goals we discussed today:  -  Continue to check pulse (heart) rate once a day  - Continue to monitor your blood pressure at least 1-2 times per week and record -  Continue to take your medications as prescribed and refill timely - follow up with your providers as recommended ( reminder your annual wellness visit is scheduled for 11/07/21) - Notify your doctor of any new or ongoing symptoms - Follow a low salt, heart healthy diet ( limit processed, high fat, high salt food items from diet) -contact your RN case manager for social work follow up for grief counseling if needed   Our next appointment is by telephone on 12/24/21 at 10:00 am  Please call the care guide team at [phone] if you need to cancel or reschedule your appointment.   If you are experiencing a Mental Health or [place] or need someone to talk to, please call the Suicide and Crisis Lifeline: 988 call 1-800-273-TALK (toll free, 24 hour hotline)   Patient verbalizes understanding of instructions and care plan provided today and agrees to view in [place]. Active MyChart status confirmed with patient.    Quinn Plowman RN,BSN,CCM RN Case Manager [place]  [phone]

## 2021-10-08 NOTE — Chronic Care Management (AMB) (Signed)
Chronic Care Management   CCM RN Visit Note  10/08/2021 Name: Margaret Vazquez MRN: [id number] DOB: [birth year]  Subjective: Margaret Vazquez is a 86 y.o. year old female who is a primary care patient of Tower, [name], MD. The care management team was consulted for assistance with disease management and care coordination needs.    Engaged with patient by telephone for follow up visit in response to provider referral for case management and/or care coordination services.   Consent to Services:  The patient was given information about Chronic Care Management services, agreed to services, and gave verbal consent prior to initiation of services.  Please see initial visit note for detailed documentation.   Patient agreed to services and verbal consent obtained.   Assessment: Review of patient past medical history, allergies, medications, health status, including review of consultants reports, laboratory and other test data, was performed as part of comprehensive evaluation and provision of chronic care management services.   SDOH (Social Determinants of Health) assessments and interventions performed:    CCM Care Plan  Allergies  Allergen Reactions   Bee Venom Hives, Shortness Of Breath and Swelling   Nabumetone Anaphylaxis   Amoxicillin-Pot Clavulanate Hives and Swelling    To lips.   Aspirin Hives   Atorvastatin Swelling     joint pain/swelling, inc liver tests   Clopidogrel Bisulfate Hives   Codeine Nausea And Vomiting   Ezetimibe Other (See Comments)     fatigue   Metformin And Related Other (See Comments)    Diarrhea    Valsartan Other (See Comments)     fatigue   Other Hives    **Red Meat**  SOB    Outpatient Encounter Medications as of 10/08/2021  Medication Sig   apixaban (ELIQUIS) 5 MG TABS tablet Take 1 tablet (5 mg total) by mouth 2 (two) times daily.   Blood Glucose Monitoring Suppl (ONE TOUCH ULTRA 2) w/Device KIT Use to check blood sugar once daily   Cyanocobalamin  (VITAMIN B-12 PO) Take 1 tablet by mouth daily.   diphenhydrAMINE (BENADRYL) 25 MG tablet Take 50 mg by mouth every 8 (eight) hours as needed for allergies.    EPINEPHrine (EPI-PEN) 0.3 mg/0.3 mL DEVI Inject 0.3 mg into the muscle daily as needed (allergic reaction).  (Patient not taking: Reported on 08/27/2021)   fluticasone (FLONASE) 50 MCG/ACT nasal spray Place 2 sprays into both nostrils 2 (two) times daily as needed. Reported on 09/10/2015   glipiZIDE (GLUCOTROL XL) 10 MG 24 hr tablet TAKE 1 TABLET(10 MG) BY MOUTH DAILY WITH BREAKFAST   glucose blood (ONETOUCH ULTRA) test strip USE TO CHECK BLOOD SUGAR ONCE DAILY (DX CODE: E11.51)   isosorbide mononitrate (IMDUR) 60 MG 24 hr tablet TAKE 1 TABLET(60 MG) BY MOUTH DAILY   losartan-hydrochlorothiazide (HYZAAR) 50-12.5 MG tablet Take 0.5 tablet (25-6.25 mg) by mouth once daily.   metFORMIN (GLUCOPHAGE-XR) 500 MG 24 hr tablet TAKE 1 TABLET(500 MG) BY MOUTH DAILY WITH BREAKFAST   nitroGLYCERIN (NITROSTAT) 0.4 MG SL tablet Place 1 tablet (0.4 mg total) under the tongue every 5 (five) minutes as needed for chest pain (up to 3 doses. If taking 3rd dose call 911).   potassium chloride SA (KLOR-CON) 20 MEQ tablet Take 1 tablet (20 mEq total) by mouth daily.   propranolol (INDERAL) 10 MG tablet Take 1 tablet (10 mg) by mouth every 1 hour x 3 doses as needed for chest pain/ fast heart rates   Facility-Administered Encounter Medications as of 10/08/2021  Medication   Study - ORION 4 - inclisiran 300 mg/1.37m or placebo SQ injection ([facility])    Patient Active Problem List   Diagnosis Date Noted   Grief reaction 05/08/2021   Elevated TSH 11/05/2020   Allergy to statin medication 03/26/2020   History of loop recorder 09/06/2019   History of seizure 03/23/2019   Breast lump on left side at 10 o'clock position 11/17/2018   Left-sided epistaxis 11/17/2018   Medicare annual wellness visit, subsequent 09/13/2018   Screening mammogram, encounter for  09/13/2018   Herpes zoster 08/10/2018   Plantar wart, left foot 06/15/2018   Coronary artery disease 12/24/2017   Routine general medical examination at a health care facility 08/24/2016   PAF ([place]ox[place]mal [place]) [place] 04/07/2016   Overweight (BMI 25.0-29.9) 08/23/2014   Type 2 diabetes mellitus with diabetic peripheral angiopathy without gangrene, without long-term current use of insulin ([facility] 01/12/2014   TIA (transient ischemic attack) 01/11/2014   History of CVA (cerebrovascular accident) 01/11/2014   LOOP Recorder LINQ 06/07/2013   Sinusitis, chronic 05/16/2013   H/O: CVA (cerebrovascular accident) 02/15/2013   Encounter for Medicare annual wellness exam 10/27/2012   Encopresis(307.7) 12/15/2011   Post-menopausal 10/28/2011   PERSONAL HX COLONIC POLYPS 09/04/2009   B12 deficiency 03/06/2009   ALLERGIC RHINITIS 02/14/2008   BACK PAIN, LUMBAR 11/03/2007   Hyperlipidemia associated with type 2 diabetes mellitus ([facility] 11/24/2006   Essential hypertension 11/24/2006   GERD 10/09/2006   OVERACTIVE BLADDER 10/09/2006   INCONTINENCE, URGE 10/09/2006   SKIN CANCER, HX OF 10/09/2006    Conditions to be addressed/monitored:Atrial Fibrillation, CAD, HTN, and Grief reaction  [facility]  Updates made by GDannielle Karvonen RN since 10/08/2021 12:00 AM     Problem: Chronic disease management, education, and/ or care coordination needs.   Priority: High     Long-Range Goal: Development of plan of care to address chronic disease managment and/ or care coordination needs   Start Date: 06/04/2021  Expected End Date: 01/03/2022  Priority: High  Note:   Current Barriers:  Knowledge Deficits related to plan of care for management of Atrial Fibrillation, CAD, and HTN  Chronic Disease Management support and education needs related to Atrial Fibrillation, CAD, and HTN Patient reports she is doing well.  She states she has not checked her blood pressure as often.   Reports blood pressures are ranging in the 120's/ 60-70's.  She states her pulse continues to range in the 50's and denies any atrial fibrillation events/ occurences.   Patient reports having a dermatology visit to have 4 skin areas evaluated/ biopsied.  She states she is waiting to hear back on the results.  Denies any further concerns at this time.  RNCM Clinical Goal(s):  Patient will verbalize basic understanding of Atrial Fibrillation, CAD, and HTN disease process and self health management plan as evidenced by patient self report and/ or notation in chart take all medications exactly as prescribed and will call provider for medication related questions as evidenced by patient report and/ or notation in chart    attend all scheduled medical appointments:  as evidenced by patient report and/ or notation in chart        continue to work with RN Care Manager and/or Social Worker to address care management and care coordination needs related to Atrial Fibrillation, CAD, and HTN as evidenced by adherence to CM Team Scheduled appointments     through collaboration with RN Care manager, provider, and care  team.   Interventions: 1:1 collaboration with primary care provider regarding development and update of comprehensive plan of care as evidenced by provider attestation and co-signature Inter-disciplinary care team collaboration (see longitudinal plan of care) Evaluation of current treatment plan related to  self management and patient's adherence to plan as established by provider   AFIB Interventions: (Status:  Goal on track:  Yes.) Long Term Goal Reviewed importance of adherence to anticoagulant exactly as prescribed Counseled on bleeding risk associated with Eliquis and importance of self-monitoring for signs/symptoms of bleeding Advised to continue to monitor pulse rated and report any concerns to provider.  Counseled on importance of regular laboratory monitoring as prescribed Advised to follow  up with provider as recommended.    CAD Interventions: (Status:  Goal on track:  Yes.) Long Term Goal Medications reviewed including medications utilized in CAD treatment plan Provided education on importance of blood pressure control in management of CAD Reviewed Importance of taking all medications as prescribed Reviewed Importance of attending all scheduled provider appointments Advised to eat a more heart healthy diet. Discussed limiting / eliminating processed foods, high fat, and high salt   Hypertension Interventions:  (Status:  Goal on track:  Yes.) Long Term Goal Last practice recorded BP readings:  BP Readings from Last 3 Encounters:  08/08/21 130/64  05/08/21 128/84  11/29/20 130/68  Evaluation of current treatment plan related to hypertension self management and patient's adherence to plan as established by provider Reviewed medications with patient and discussed importance of compliance Discussed plans with patient for ongoing care management follow up and provided patient with direct contact information for care management team Reviewed scheduled/upcoming provider appointments  Advised patient to continue to monitor blood pressure daily and record.  Continue to follow a low salt diet.   Grieving: ( Goal on track: Yes) Long term goal   Continue to allow patient to discuss  feels and needed.    Patient Goals/Self-Care Activities: -  Continue to check pulse (heart) rate once a day  - Continue to monitor your blood pressure at least 1-2 times per week and record -  Continue to take your medications as prescribed and refill timely - follow up with your providers as recommended ( reminder your annual wellness visit is scheduled for 11/07/21) - Notify your doctor of any new or ongoing symptoms - Follow a low salt, heart healthy diet ( limit processed, high fat, high salt food items from diet) -contact your RN case manager for social work follow up for grief counseling if  needed.     Plan:The patient has been provided with contact information for the care management team and has been advised to call with any health related questions or concerns.  The care management team will reach out to the patient again over the next 1-2 months. Quinn Plowman RN,BSN,CCM RN Case Manager [place]  [phone]

## 2021-10-30 ENCOUNTER — Telehealth: Payer: Self-pay | Admitting: Family Medicine

## 2021-10-30 DIAGNOSIS — E538 Deficiency of other specified B group vitamins: Secondary | ICD-10-CM

## 2021-10-30 DIAGNOSIS — I10 Essential (primary) hypertension: Secondary | ICD-10-CM

## 2021-10-30 DIAGNOSIS — R7989 Other specified abnormal findings of blood chemistry: Secondary | ICD-10-CM

## 2021-10-30 DIAGNOSIS — E1169 Type 2 diabetes mellitus with other specified complication: Secondary | ICD-10-CM

## 2021-10-30 DIAGNOSIS — E1151 Type 2 diabetes mellitus with diabetic peripheral angiopathy without gangrene: Secondary | ICD-10-CM

## 2021-10-30 NOTE — Telephone Encounter (Signed)
-----   Message from Velna Hatchet, RT sent at 10/14/2021  9:23 AM EDT ----- ?Regarding: Lab Thu 10/31/21 ?Patient is scheduled for cpx, please order future labs.  Thanks, Anda Kraft  ? ?

## 2021-10-31 ENCOUNTER — Other Ambulatory Visit (INDEPENDENT_AMBULATORY_CARE_PROVIDER_SITE_OTHER): Payer: HMO

## 2021-10-31 DIAGNOSIS — I10 Essential (primary) hypertension: Secondary | ICD-10-CM

## 2021-10-31 DIAGNOSIS — E538 Deficiency of other specified B group vitamins: Secondary | ICD-10-CM | POA: Diagnosis not present

## 2021-10-31 DIAGNOSIS — E785 Hyperlipidemia, unspecified: Secondary | ICD-10-CM | POA: Diagnosis not present

## 2021-10-31 DIAGNOSIS — E1169 Type 2 diabetes mellitus with other specified complication: Secondary | ICD-10-CM | POA: Diagnosis not present

## 2021-10-31 DIAGNOSIS — R7989 Other specified abnormal findings of blood chemistry: Secondary | ICD-10-CM | POA: Diagnosis not present

## 2021-10-31 DIAGNOSIS — E1151 Type 2 diabetes mellitus with diabetic peripheral angiopathy without gangrene: Secondary | ICD-10-CM

## 2021-10-31 LAB — CBC WITH DIFFERENTIAL/PLATELET
Basophils Absolute: 0.1 10*3/uL (ref 0.0–0.1)
Basophils Relative: 1.4 % (ref 0.0–3.0)
Eosinophils Absolute: 0.2 10*3/uL (ref 0.0–0.7)
Eosinophils Relative: 3.2 % (ref 0.0–5.0)
HCT: 42.6 % (ref 36.0–46.0)
Hemoglobin: 14.2 g/dL (ref 12.0–15.0)
Lymphocytes Relative: 34.8 % (ref 12.0–46.0)
Lymphs Abs: 1.9 10*3/uL (ref 0.7–4.0)
MCHC: 33.3 g/dL (ref 30.0–36.0)
MCV: 95.9 fl (ref 78.0–100.0)
Monocytes Absolute: 0.4 10*3/uL (ref 0.1–1.0)
Monocytes Relative: 8 % (ref 3.0–12.0)
Neutro Abs: 2.9 10*3/uL (ref 1.4–7.7)
Neutrophils Relative %: 52.6 % (ref 43.0–77.0)
Platelets: 205 10*3/uL (ref 150.0–400.0)
RBC: 4.44 Mil/uL (ref 3.87–5.11)
RDW: 13.9 % (ref 11.5–15.5)
WBC: 5.5 10*3/uL (ref 4.0–10.5)

## 2021-10-31 LAB — MICROALBUMIN / CREATININE URINE RATIO
Creatinine,U: 81.4 mg/dL
Microalb Creat Ratio: 1 mg/g (ref 0.0–30.0)
Microalb, Ur: 0.8 mg/dL (ref 0.0–1.9)

## 2021-10-31 LAB — COMPREHENSIVE METABOLIC PANEL
ALT: 10 U/L (ref 0–35)
AST: 14 U/L (ref 0–37)
Albumin: 4.1 g/dL (ref 3.5–5.2)
Alkaline Phosphatase: 67 U/L (ref 39–117)
BUN: 19 mg/dL (ref 6–23)
CO2: 31 mEq/L (ref 19–32)
Calcium: 9.2 mg/dL (ref 8.4–10.5)
Chloride: 104 mEq/L (ref 96–112)
Creatinine, Ser: 0.99 mg/dL (ref 0.40–1.20)
GFR: 52.52 mL/min — ABNORMAL LOW (ref >60.00)
Glucose, Bld: 93 mg/dL (ref 70–99)
Potassium: 4.1 mEq/L (ref 3.5–5.1)
Sodium: 142 mEq/L (ref 135–145)
Total Bilirubin: 0.7 mg/dL (ref 0.2–1.2)
Total Protein: 6.5 g/dL (ref 6.0–8.3)

## 2021-10-31 LAB — TSH: TSH: 3.02 u[IU]/mL (ref 0.35–5.50)

## 2021-10-31 LAB — LIPID PANEL
Cholesterol: 186 mg/dL (ref 0–200)
HDL: 51.2 mg/dL (ref >39.00)
LDL Cholesterol: 110 mg/dL — ABNORMAL HIGH (ref 0–99)
NonHDL: 135.09
Total CHOL/HDL Ratio: 4
Triglycerides: 124 mg/dL (ref 0.0–149.0)
VLDL: 24.8 mg/dL (ref 0.0–40.0)

## 2021-10-31 LAB — HEMOGLOBIN A1C: Hgb A1c MFr Bld: 6.6 % — ABNORMAL HIGH (ref 4.6–6.5)

## 2021-10-31 LAB — VITAMIN B12: Vitamin B-12: 877 pg/mL (ref 211–911)

## 2021-11-03 DIAGNOSIS — I10 Essential (primary) hypertension: Secondary | ICD-10-CM | POA: Diagnosis not present

## 2021-11-03 DIAGNOSIS — I2510 Atherosclerotic heart disease of native coronary artery without angina pectoris: Secondary | ICD-10-CM

## 2021-11-03 DIAGNOSIS — I480 Paroxysmal atrial fibrillation: Secondary | ICD-10-CM | POA: Diagnosis not present

## 2021-11-07 ENCOUNTER — Encounter: Payer: Self-pay | Admitting: Family Medicine

## 2021-11-07 ENCOUNTER — Ambulatory Visit (INDEPENDENT_AMBULATORY_CARE_PROVIDER_SITE_OTHER): Payer: HMO | Admitting: Family Medicine

## 2021-11-07 VITALS — BP 128/76 | HR 63 | Temp 97.9°F | Ht 67.5 in | Wt 192.1 lb

## 2021-11-07 DIAGNOSIS — Z888 Allergy status to other drugs, medicaments and biological substances status: Secondary | ICD-10-CM | POA: Diagnosis not present

## 2021-11-07 DIAGNOSIS — Z0000 Encounter for general adult medical examination without abnormal findings: Secondary | ICD-10-CM

## 2021-11-07 DIAGNOSIS — E1169 Type 2 diabetes mellitus with other specified complication: Secondary | ICD-10-CM

## 2021-11-07 DIAGNOSIS — E785 Hyperlipidemia, unspecified: Secondary | ICD-10-CM

## 2021-11-07 DIAGNOSIS — I10 Essential (primary) hypertension: Secondary | ICD-10-CM | POA: Diagnosis not present

## 2021-11-07 DIAGNOSIS — I480 Paroxysmal atrial fibrillation: Secondary | ICD-10-CM

## 2021-11-07 DIAGNOSIS — E1151 Type 2 diabetes mellitus with diabetic peripheral angiopathy without gangrene: Secondary | ICD-10-CM

## 2021-11-07 DIAGNOSIS — R7989 Other specified abnormal findings of blood chemistry: Secondary | ICD-10-CM

## 2021-11-07 DIAGNOSIS — N318 Other neuromuscular dysfunction of bladder: Secondary | ICD-10-CM | POA: Diagnosis not present

## 2021-11-07 DIAGNOSIS — E2839 Other primary ovarian failure: Secondary | ICD-10-CM | POA: Diagnosis not present

## 2021-11-07 DIAGNOSIS — E538 Deficiency of other specified B group vitamins: Secondary | ICD-10-CM | POA: Diagnosis not present

## 2021-11-07 MED ORDER — GLIPIZIDE ER 10 MG PO TB24: ORAL_TABLET | ORAL | 3 refills | Status: DC

## 2021-11-07 MED ORDER — METFORMIN HCL ER 500 MG PO TB24: ORAL_TABLET | ORAL | 3 refills | Status: DC

## 2021-11-07 MED ORDER — POTASSIUM CHLORIDE CRYS ER 20 MEQ PO TBCR: 20.0000 meq | EXTENDED_RELEASE_TABLET | Freq: Every day | ORAL | 3 refills | Status: DC

## 2021-11-07 NOTE — Assessment & Plan Note (Signed)
Reviewed health habits including diet and exercise and skin cancer prevention ?Reviewed appropriate screening tests for age  ?Also reviewed health mt list, fam hx and immunization status , as well as social and family history   ?See HPI ?Labs reviewed  ?Colon and breast cancer screen utd ?imms reviewed  ?Declines shingrix  ?dexa ordered/no falls or fx  ?Adv directive utd ?No cognitive concerns ?Failed hearing screen/ declines further eval ?Eye and vision screen utd  ?PHQ score of 0 /discussed stressors  ?No help needed with ADLs ?Good functionality  ? ?

## 2021-11-07 NOTE — Assessment & Plan Note (Signed)
bp in fair control at this time  ?BP Readings from Last 1 Encounters:  ?11/07/21 128/76  ? ?No changes needed ?Most recent labs reviewed  ?Disc lifstyle change with low sodium diet and exercise  ?Plan to continue  ?Losartan hct 50-12.5 mg half pill daily  ?Imdur 60 mg daily  ?Propranolol prn for inc HR ?

## 2021-11-07 NOTE — Assessment & Plan Note (Signed)
Under cardiology care  ?Taking eliquis  ?

## 2021-11-07 NOTE — Assessment & Plan Note (Signed)
Worse at night ?Given handout  ?Plan to f/u if this worsens  ?

## 2021-11-07 NOTE — Assessment & Plan Note (Signed)
Reviewed health habits including diet and exercise and skin cancer prevention ?Reviewed appropriate screening tests for age  ?Also reviewed health mt list, fam hx and immunization status , as well as social and family history   ?See HPI ?Labs reviewed  ?Colon and breast cancer screen utd ?imms reviewed  ?Declines shingrix  ?dexa ordered/no falls or fx  ?Adv directive utd ?No cognitive concerns ?Failed hearing screen/ declines further eval ?Eye and vision screen utd  ?PHQ score of 0 /discussed stressors  ?No help needed with ADLs ?Good functionality  ? ? ?

## 2021-11-07 NOTE — Assessment & Plan Note (Signed)
Lab Results  ?Component Value Date  ? HGBA1C 6.6 (H) 10/31/2021  ? ?Fairly stable  ?Plan to continue glipizide xl 10 mg daily and metformin xr 500 mg daily  ?Eye exam utd  ?Takes ace ?Allergic to statin  ?Good foot care ?Nl microalbumin  ?

## 2021-11-07 NOTE — Progress Notes (Signed)
Subjective:    Patient ID: Margaret Vazquez, female    DOB: 22-Sep-1935, 86 y.o.   MRN: [id number]  HPI Pt presents for amw and health mt visit    I have personally reviewed the Medicare Annual Wellness questionnaire and have noted 1. The patient's medical and social history 2. Their use of alcohol, tobacco or illicit drugs 3. Their current medications and supplements 4. The patient's functional ability including ADL's, fall risks, home safety risks and hearing or visual             impairment. 5. Diet and physical activities 6. Evidence for depression or mood disorders  The patients weight, height, BMI have been recorded in the chart and visual acuity is per eye clinic.  I have made referrals, counseling and provided education to the patient based review of the above and I have provided the pt with a written personalized care plan for preventive services. Reviewed and updated provider list, see scanned forms.  See scanned forms.  Routine anticipatory guidance given to patient.  See health maintenance. Colon cancer screening  colonoscopy 2012   cologuard neg 2019  Breast cancer screening  mammogram 06/2021 Self breast exam: no lumps  Flu vaccine 04/2021 Tetanus vaccine -postponed for insurance Pneumovax up to date  Zoster vaccine: not interested  Dexa 11/2011 normal   Falls: none Fractures: none  Supplements: vit D Exercise  not a lot   L hand goes to sleep often  Comes and goes  When she is working with it   Some pain in low back when she stands also  Walks for exercise/not daily  Advance directive is up to date, is in the chart  Cognitive function addressed- see scanned forms- and if abnormal then additional documentation follows.   Doing well  Occ loses her keys  Has to run the household  Husband it struggling    PMH and SH reviewed  Meds, vitals, and allergies reviewed.   ROS: See HPI.  Otherwise negative.    Weight : Wt Readings from Last 3 Encounters:   11/07/21 192 lb 2 oz (87.1 kg)  08/08/21 189 lb 2 oz (85.8 kg)  05/08/21 189 lb 4 oz (85.8 kg)   29.65 kg/m  Some overactive bladder -worse at night  Not leaking  Not continent of stool all the time     Hearing/vision: Hearing Screening   '500Hz'$  '1000Hz'$  '2000Hz'$  '4000Hz'$   Right ear 0 0 0 0  Left ear 0 0 0 0  Vision Screening - Comments:: Dr. Mordecai Rasmussen at [facility] pt has yearly eye exam   Failed hearing screen again  She does not notice any problems and declines an evaluation   PHQ:     11/07/2021    9:06 AM 04/01/2021    2:15 PM 11/05/2020    2:28 PM 10/20/2019   10:11 AM 09/21/2019   10:13 AM  Depression screen PHQ 2/9  Decreased Interest 0 0 0 0 0  Down, Depressed, Hopeless 0 1 0 0 0  PHQ - 2 Score 0 1 0 0 0    Nerves are shot again  Lost daughter unexpectedly in the fall  Then another child was in mva this weekend/he broke bones/ feeling better   Plans to go to the mt this summer  Wears sun block   ADLs: no help needed   Functionality: good   Care team : Vernell Barrier- cardiology  [facility]  Has had  biopsies   HTN  (in setting of a fib) on eliquis  bp is stable today  No cp or palpitations or headaches or edema  No side effects to medicines  BP Readings from Last 3 Encounters:  11/07/21 128/76  08/08/21 130/64  05/08/21 128/84     Losartan hct 50-12.5 mg half pill daily  Imdur 60 mg daily  Propranolol prn for inc HR  Pulse Readings from Last 3 Encounters:  11/07/21 63  08/08/21 (!) 57  05/08/21 (!) 57     She is on a study drug   Take K 20 meq daily  Lab Results  Component Value Date   CREATININE 0.99 10/31/2021   BUN 19 10/31/2021   NA 142 10/31/2021   K 4.1 10/31/2021   CL 104 10/31/2021   CO2 31 10/31/2021     DM2 Lab Results  Component Value Date   HGBA1C 6.6 (H) 10/31/2021   Up from 6.2 Glucose is around 100 in am   Glipizide XL 10 mg daily  Metformin xr 500 mg daily Eye  exam utd from fall  Diet is fairly good  Avoids sweets for the most part    Lab Results  Component Value Date   MICROALBUR 0.8 10/31/2021   MICROALBUR 0.5 05/27/2010     On ace Allergic to statins    Hyperlipidemia  Lab Results  Component Value Date   CHOL 186 10/31/2021   CHOL 188 05/01/2021   CHOL 187 09/18/2020   Lab Results  Component Value Date   HDL 51.20 10/31/2021   HDL 39.90 05/01/2021   HDL 51.00 09/18/2020   Lab Results  Component Value Date   LDLCALC 110 (H) 10/31/2021   LDLCALC 117 (H) 09/18/2020   LDLCALC 109 (H) 03/22/2020   Lab Results  Component Value Date   TRIG 124.0 10/31/2021   TRIG 204.0 (H) 05/01/2021   TRIG 94.0 09/18/2020   Lab Results  Component Value Date   CHOLHDL 4 10/31/2021   CHOLHDL 5 05/01/2021   CHOLHDL 4 09/18/2020   Lab Results  Component Value Date   LDLDIRECT 119.0 05/01/2021   LDLDIRECT 107.0 08/27/2016   LDLDIRECT 136.5 10/20/2011   No statin due to rxn incl inc liver tests and joint swelling   Vit B12 def  Lab Results  Component Value Date   [id number] 10/31/2021   Oral B12 supplementation   Past inc tsh Lab Results  Component Value Date   TSH 3.02 10/31/2021    Other labs Lab Results  Component Value Date   CREATININE 0.99 10/31/2021   BUN 19 10/31/2021   NA 142 10/31/2021   K 4.1 10/31/2021   CL 104 10/31/2021   CO2 31 10/31/2021   GFR of 52.5  Lab Results  Component Value Date   ALT 10 10/31/2021   AST 14 10/31/2021   ALKPHOS 67 10/31/2021   BILITOT 0.7 10/31/2021   Lab Results  Component Value Date   WBC 5.5 10/31/2021   HGB 14.2 10/31/2021   HCT 42.6 10/31/2021   MCV 95.9 10/31/2021   PLT 205.0 10/31/2021   Patient Active Problem List   Diagnosis Date Noted   Estrogen deficiency 11/07/2021   Grief reaction 05/08/2021   Elevated TSH 11/05/2020   Allergy to statin medication 03/26/2020   History of loop recorder 09/06/2019   History of seizure 03/23/2019   Left-sided  epistaxis 11/17/2018   Medicare annual wellness visit, subsequent 09/13/2018   Screening mammogram, encounter for 09/13/2018  Herpes zoster 08/10/2018   Coronary artery disease 12/24/2017   Routine general medical examination at a health care facility 08/24/2016   PAF (paroxysmal atrial fibrillation) ([place]) 04/07/2016   Overweight (BMI 25.0-29.9) 08/23/2014   Type 2 diabetes mellitus with diabetic peripheral angiopathy without gangrene, without long-term current use of insulin ([place]) 01/12/2014   TIA (transient ischemic attack) 01/11/2014   History of CVA (cerebrovascular accident) 01/11/2014   LOOP Recorder LINQ 06/07/2013   Sinusitis, chronic 05/16/2013   H/O: CVA (cerebrovascular accident) 02/15/2013   Encounter for Medicare annual wellness exam 10/27/2012   Encopresis(307.7) 12/15/2011   Post-menopausal 10/28/2011   PERSONAL HX COLONIC POLYPS 09/04/2009   B12 deficiency 03/06/2009   ALLERGIC RHINITIS 02/14/2008   BACK PAIN, LUMBAR 11/03/2007   Hyperlipidemia associated with type 2 diabetes mellitus ([place]) 11/24/2006   Essential hypertension 11/24/2006   GERD 10/09/2006   OVERACTIVE BLADDER 10/09/2006   INCONTINENCE, URGE 10/09/2006   SKIN CANCER, HX OF 10/09/2006   Past Medical History:  Diagnosis Date   Allergy history, drug    Aspirin   Basal cell carcinoma    "back"   CAD (coronary artery disease)    a. Previously nonobstructive then progressive angina with abnl CT -> cardiac cath 12/24/17 showed 30% mid RCA and 80% prox-mid Cx. She received DES to mid AV groove Cx. EF 55-65%.    Cervical stenosis of spine    With neck pain   CKD (chronic kidney disease), stage II    Colon polyps [id number]   Complication of anesthesia 1980s   slow to wake after anesthesia "when I had breast biopsy"   GERD (gastroesophageal reflux disease)    Hyperlipidemia    myalgias with Lipitor and Zetia   Hypertension    Migraine    "stopped at age 52" (12/24/2017)   Squamous carcinoma    "iced off  and cut off; mostly arms" (12/24/2017)   Stroke [facility])    "told me I'd had 2 strokes in 02/2013"; denies residual on 12/24/2017   TIA (transient ischemic attack)    Type II diabetes mellitus ([place])    Past Surgical History:  Procedure Laterality Date   ABD [place]  07/2003   Negative   APPENDECTOMY     BASAL CELL CARCINOMA EXCISION     "back"   BREAST BIOPSY Left 1992   benign   CARDIAC CATHETERIZATION  04/07/2011   non obst CAD (Dr Burt Knack)   CATARACT EXTRACTION W/PHACO Left 04/01/2017   Procedure: CATARACT EXTRACTION PHACO AND INTRAOCULAR LENS PLACEMENT ([place]) LEFT DIABETIC;  Surgeon: Leandrew Koyanagi, MD;  Location: [place];  Service: Ophthalmology;  Laterality: Left;  Diabetic - oral meds   CATARACT EXTRACTION W/PHACO Right 04/29/2017   Procedure: CATARACT EXTRACTION PHACO AND INTRAOCULAR LENS PLACEMENT ([place]) RIGHT DIABETIC;  Surgeon: Leandrew Koyanagi, MD;  Location: [place];  Service: Ophthalmology;  Laterality: Right;  Diabetic - oral meds   COLONOSCOPY  12/2007   Adenomatous colon polyps   CORONARY ANGIOPLASTY WITH STENT PLACEMENT  12/24/2017   CORONARY STENT INTERVENTION N/A 12/24/2017   Procedure: CORONARY STENT INTERVENTION;  Surgeon: Lorretta Harp, MD;  Location: [place] CV LAB;  Service: Cardiovascular;  Laterality: N/A;   CYSTOSCOPY W/ DECANNULATION  03/2000   Normal   LEFT HEART CATH AND CORONARY ANGIOGRAPHY N/A 12/24/2017   Procedure: LEFT HEART CATH AND CORONARY ANGIOGRAPHY;  Surgeon: Lorretta Harp, MD;  Location: [place] CV LAB;  Service: Cardiovascular;  Laterality: N/A;   LOOP RECORDER IMPLANT N/A  02/16/2013   Procedure: LOOP RECORDER IMPLANT;  Surgeon: Deboraha Sprang, MD;  Location[facility] CATH LAB;  Service: Cardiovascular;  Laterality: N/A;   MOHS SURGERY     right hand    NASAL SINUS SURGERY  01/2005   SQUAMOUS CELL CARCINOMA EXCISION     "mostly arms;" (12/24/2017)   STRESS CARDIOLITE  11/1999   Normal/ negative   TEAR DUCT  PROBING  2005   "? side"   TEE WITHOUT CARDIOVERSION N/A 02/16/2013   Procedure: TRANSESOPHAGEAL ECHOCARDIOGRAM (TEE);  Surgeon: Josue Hector, MD;  Location: [facility] ENDOSCOPY;  Service: Cardiovascular;  Laterality: N/A;   TUBAL LIGATION     BTL   Social History   Tobacco Use   Smoking status: Never   Smokeless tobacco: Never  Vaping Use   Vaping Use: Never used  Substance Use Topics   Alcohol use: Never    Alcohol/week: 0.0 standard drinks   Drug use: Never   Family History  Problem Relation Age of Onset   Lung cancer Brother    Diabetes Brother    Pancreatic cancer Brother    Heart disease Mother    Heart disease Father    Brain cancer Other    Skin cancer Daughter    Diabetes Sister    Breast cancer Sister    Colon cancer Neg Hx    Allergies  Allergen Reactions   Bee Venom Hives, Shortness Of Breath and Swelling   Nabumetone Anaphylaxis   Amoxicillin-Pot Clavulanate Hives and Swelling    To lips.   Aspirin Hives   Atorvastatin Swelling     joint pain/swelling, inc liver tests   Clopidogrel Bisulfate Hives   Codeine Nausea And Vomiting   Ezetimibe Other (See Comments)     fatigue   Metformin And Related Other (See Comments)    Diarrhea    Valsartan Other (See Comments)     fatigue   Other Hives    **Red Meat**  SOB   Current Outpatient Medications on File Prior to Visit  Medication Sig Dispense Refill   apixaban (ELIQUIS) 5 MG TABS tablet Take 1 tablet (5 mg total) by mouth 2 (two) times daily. 60 tablet 5   Blood Glucose Monitoring Suppl (ONE TOUCH ULTRA 2) w/Device KIT Use to check blood sugar once daily 1 each 0   Cyanocobalamin (VITAMIN B-12 PO) Take 1 tablet by mouth daily.     diphenhydrAMINE (BENADRYL) 25 MG tablet Take 50 mg by mouth every 8 (eight) hours as needed for allergies.      EPINEPHrine (EPI-PEN) 0.3 mg/0.3 mL DEVI Inject 0.3 mg into the muscle daily as needed (allergic reaction).     fluticasone (FLONASE) 50 MCG/ACT nasal spray Place 2  sprays into both nostrils 2 (two) times daily as needed. Reported on 09/10/2015     glucose blood (ONETOUCH ULTRA) test strip USE TO CHECK BLOOD SUGAR ONCE DAILY (DX CODE: E11.51) 100 each 1   isosorbide mononitrate (IMDUR) 60 MG 24 hr tablet TAKE 1 TABLET(60 MG) BY MOUTH DAILY 90 tablet 3   losartan-hydrochlorothiazide (HYZAAR) 50-12.5 MG tablet Take 0.5 tablet (25-6.25 mg) by mouth once daily. 45 tablet 2   nitroGLYCERIN (NITROSTAT) 0.4 MG SL tablet Place 1 tablet (0.4 mg total) under the tongue every 5 (five) minutes as needed for chest pain (up to 3 doses. If taking 3rd dose call 911). 25 tablet 3   propranolol (INDERAL) 10 MG tablet Take 1 tablet (10 mg) by mouth every 1 hour x 3  doses as needed for chest pain/ fast heart rates 30 tablet 1   Current Facility-Administered Medications on File Prior to Visit  Medication Dose Route Frequency Provider Last Rate Last Admin   Study - ORION 4 - inclisiran 300 mg/1.8m or placebo SQ injection ([facility])  300 mg Subcutaneous Q6 months SHillary Bow MD         Review of Systems  Constitutional:  Positive for fatigue. Negative for activity change, appetite change, fever and unexpected weight change.  HENT:  Negative for congestion, ear pain, rhinorrhea, sinus pressure and sore throat.   Eyes:  Negative for pain, redness and visual disturbance.  Respiratory:  Negative for cough, shortness of breath and wheezing.   Cardiovascular:  Negative for chest pain and palpitations.  Gastrointestinal:  Negative for abdominal pain, blood in stool, constipation and diarrhea.  Endocrine: Negative for polydipsia and polyuria.  Genitourinary:  Positive for frequency. Negative for dysuria and urgency.  Musculoskeletal:  Negative for arthralgias, back pain and myalgias.  Skin:  Negative for pallor and rash.  Allergic/Immunologic: Negative for environmental allergies.  Neurological:  Negative for dizziness, syncope and headaches.       Hand goes to sleep  occasionally  Hematological:  Negative for adenopathy. Does not bruise/bleed easily.  Psychiatric/Behavioral:  Negative for decreased concentration and dysphoric mood. The patient is not nervous/anxious.        Stressors       Objective:   Physical Exam Constitutional:      General: She is not in acute distress.    Appearance: Normal appearance. She is well-developed and normal weight. She is not ill-appearing or diaphoretic.  HENT:     Head: Normocephalic and atraumatic.     Right Ear: Tympanic membrane, ear canal and external ear normal.     Left Ear: Tympanic membrane, ear canal and external ear normal.     Nose: Nose normal. No congestion.     Mouth/Throat:     Mouth: Mucous membranes are moist.     Pharynx: Oropharynx is clear. No posterior oropharyngeal erythema.  Eyes:     General: No scleral icterus.    Extraocular Movements: Extraocular movements intact.     Conjunctiva/sclera: Conjunctivae normal.     Pupils: Pupils are equal, round, and reactive to light.  Neck:     Thyroid: No thyromegaly.     Vascular: No carotid bruit or JVD.  Cardiovascular:     Rate and Rhythm: Normal rate and regular rhythm.     Pulses: Normal pulses.     Heart sounds: Normal heart sounds.    No gallop.  Pulmonary:     Effort: Pulmonary effort is normal. No respiratory distress.     Breath sounds: Normal breath sounds. No wheezing.     Comments: Good air exch Chest:     Chest wall: No tenderness.  Abdominal:     General: Bowel sounds are normal. There is no distension or abdominal bruit.     Palpations: Abdomen is soft. There is no mass.     Tenderness: There is no abdominal tenderness.     Hernia: No hernia is present.  Genitourinary:    Comments: Breast exam: No mass, nodules, thickening, tenderness, bulging, retraction, inflamation, nipple discharge or skin changes noted.  No axillary or clavicular LA.     Musculoskeletal:        General: No tenderness. Normal range of motion.      Cervical back: Normal range of motion and neck supple. No  rigidity. No muscular tenderness.     Right lower leg: No edema.     Left lower leg: No edema.     Comments: No kyphosis   Lymphadenopathy:     Cervical: No cervical adenopathy.  Skin:    General: Skin is warm and dry.     Coloration: Skin is not pale.     Findings: No erythema or rash.     Comments: Solar lentigines diffusely Solar aging  SKs scattered   Neurological:     Mental Status: She is alert. Mental status is at baseline.     Cranial Nerves: No cranial nerve deficit.     Motor: No abnormal muscle tone.     Coordination: Coordination normal.     Gait: Gait normal.     Deep Tendon Reflexes: Reflexes are normal and symmetric. Reflexes normal.  Psychiatric:        Mood and Affect: Mood normal.        Cognition and Memory: Cognition and memory normal.     Comments: Mentally sharp           Assessment & Plan:   Problem List Items Addressed This Visit       Cardiovascular and Mediastinum   Essential hypertension    bp in fair control at this time  BP Readings from Last 1 Encounters:  11/07/21 128/76  No changes needed Most recent labs reviewed  Disc lifstyle change with low sodium diet and exercise  Plan to continue  Losartan hct 50-12.5 mg half pill daily  Imdur 60 mg daily  Propranolol prn for inc HR      PAF (paroxysmal atrial fibrillation) ([place])    Under cardiology care  Taking eliquis        Type 2 diabetes mellitus with diabetic peripheral angiopathy without gangrene, without long-term current use of insulin (H[facility])    Lab Results  Component Value Date   HGBA1C 6.6 (H) 10/31/2021  Fairly stable  Plan to continue glipizide xl 10 mg daily and metformin xr 500 mg daily  Eye exam utd  Takes ace Allergic to statin  Good foot care Nl microalbumin       Relevant Medications   metFORMIN (GLUCOPHAGE-XR) 500 MG 24 hr tablet   glipiZIDE (GLUCOTROL XL) 10 MG 24 hr tablet     Endocrine    Hyperlipidemia associated with type 2 diabetes mellitus ([place])    Disc goals for lipids and reasons to control them Rev last labs with pt Rev low sat fat diet in detail Stable Allergic to statins  Good diet  Was on a study drug in the past       Relevant Medications   metFORMIN (GLUCOPHAGE-XR) 500 MG 24 hr tablet   glipiZIDE (GLUCOTROL XL) 10 MG 24 hr tablet     Genitourinary   OVERACTIVE BLADDER    Worse at night Given handout  Plan to f/u if this worsens          Other   Allergy to statin medication   B12 deficiency    Lab Results  Component Value Date   VITAMINB12 877 10/31/2021  Plan to continue oral B12      Elevated TSH    TSH and FT4 nl this draw  No clinical changes        Estrogen deficiency   Relevant Orders   DG Bone Density   Medicare annual wellness visit, subsequent - Primary    Reviewed health habits including diet and exercise and  skin cancer prevention Reviewed appropriate screening tests for age  Also reviewed health mt list, fam hx and immunization status , as well as social and family history   See HPI Labs reviewed  Colon and breast cancer screen utd imms reviewed  Declines shingrix  dexa ordered/no falls or fx  Adv directive utd No cognitive concerns Failed hearing screen/ declines further eval Eye and vision screen utd  PHQ score of 0 /discussed stressors  No help needed with ADLs Good functionality         Routine general medical examination at a health care facility    Reviewed health habits including diet and exercise and skin cancer prevention Reviewed appropriate screening tests for age  Also reviewed health mt list, fam hx and immunization status , as well as social and family history   See HPI Labs reviewed  Colon and breast cancer screen utd imms reviewed  Declines shingrix  dexa ordered/no falls or fx  Adv directive utd No cognitive concerns Failed hearing screen/ declines further eval Eye and vision screen utd   PHQ score of 0 /discussed stressors  No help needed with ADLs Good functionality

## 2021-11-07 NOTE — Assessment & Plan Note (Signed)
TSH and FT4 nl this draw  ?No clinical changes  ?

## 2021-11-07 NOTE — Patient Instructions (Addendum)
Try a slow walk for 10-15 minutes daily to see if it helps your back  ? ?Keep taking care of yourself  ? ?Eat low sugar diet  ?Wear sun protection  ? ?Follow up in 6 months  ? ? ?Call the breast center to schedule a bone density test ? ? ?Please call the location of your choice from the menu below to schedule your Mammogram and/or Bone Density appointment.   ? ?[place]  ? ?[facility]                ?      Phone:  [phone] ?[address]                               ?[place], [place] [address]                                                             ?Services: Traditional and 3D Mammogram, Bone Density  ? ?[facility] [facility]           ?      Phone: [phone] ?[address]                                                       ?[place], [place] [address]    ?Service: Bone Density ONLY  ? *this site does NOT perform mammograms ? ?[place]                       ? Phone:  [phone] ?[address]                                  ?[place], [place] [address]                                            ?Services:  3D Mammogram and Bone Density  ? ? ?[place] ? ?[facility]   ?Phone:  [phone]   ?[place], [place] [address]                                            ?Services: 3D Mammogram and Bone Density ? ?[place] [facility])  ?Phone:  [phone]   ?[address]                        ?[place],  [address]                                              ?Services:  3D Mammogram and Bone Density ? ? ? ?

## 2021-11-07 NOTE — Assessment & Plan Note (Signed)
Lab Results  ?Component Value Date  ? [id number] 877 10/31/2021  ? ?Plan to continue oral B12 ?

## 2021-11-07 NOTE — Assessment & Plan Note (Signed)
Disc goals for lipids and reasons to control them ?Rev last labs with pt ?Rev low sat fat diet in detail ?Stable ?Allergic to statins  ?Good diet  ?Was on a study drug in the past  ?

## 2021-11-25 ENCOUNTER — Other Ambulatory Visit: Payer: Self-pay | Admitting: Pharmacist

## 2021-11-25 MED ORDER — APIXABAN 5 MG PO TABS: 5.0000 mg | ORAL_TABLET | Freq: Two times a day (BID) | ORAL | 5 refills | Status: DC

## 2021-11-25 NOTE — Progress Notes (Signed)
Age 86, weight 87kg, SCr 0.99 on 10/2021 Last OV 08/2021, afib indication

## 2021-12-05 DIAGNOSIS — D2372 Other benign neoplasm of skin of left lower limb, including hip: Secondary | ICD-10-CM | POA: Diagnosis not present

## 2021-12-05 DIAGNOSIS — C44729 Squamous cell carcinoma of skin of left lower limb, including hip: Secondary | ICD-10-CM | POA: Diagnosis not present

## 2021-12-11 ENCOUNTER — Other Ambulatory Visit: Payer: Self-pay | Admitting: Family Medicine

## 2021-12-18 DIAGNOSIS — L905 Scar conditions and fibrosis of skin: Secondary | ICD-10-CM | POA: Diagnosis not present

## 2021-12-18 DIAGNOSIS — D044 Carcinoma in situ of skin of scalp and neck: Secondary | ICD-10-CM | POA: Diagnosis not present

## 2021-12-18 DIAGNOSIS — D0462 Carcinoma in situ of skin of left upper limb, including shoulder: Secondary | ICD-10-CM | POA: Diagnosis not present

## 2021-12-24 ENCOUNTER — Ambulatory Visit (INDEPENDENT_AMBULATORY_CARE_PROVIDER_SITE_OTHER): Payer: HMO

## 2021-12-24 DIAGNOSIS — I480 Paroxysmal atrial fibrillation: Secondary | ICD-10-CM

## 2021-12-24 DIAGNOSIS — F4321 Adjustment disorder with depressed mood: Secondary | ICD-10-CM

## 2021-12-24 DIAGNOSIS — I10 Essential (primary) hypertension: Secondary | ICD-10-CM

## 2021-12-24 DIAGNOSIS — I2510 Atherosclerotic heart disease of native coronary artery without angina pectoris: Secondary | ICD-10-CM

## 2021-12-24 NOTE — Chronic Care Management (AMB) (Signed)
Chronic Care Management   CCM RN Visit Note  12/24/2021 Name: Margaret Vazquez MRN: [id number] DOB: 1936-04-24  Subjective: Margaret Vazquez is a 86 y.o. year old female who is a primary care patient of Tower, [name], MD. The care management team was consulted for assistance with disease management and care coordination needs.    Engaged with patient by telephone for follow up visit in response to provider referral for case management and/or care coordination services.   Consent to Services:  The patient was given information about Chronic Care Management services, agreed to services, and gave verbal consent prior to initiation of services.  Please see initial visit note for detailed documentation.   Patient agreed to services and verbal consent obtained.   Assessment: Review of patient past medical history, allergies, medications, health status, including review of consultants reports, laboratory and other test data, was performed as part of comprehensive evaluation and provision of chronic care management services.   SDOH (Social Determinants of Health) assessments and interventions performed:    CCM Care Plan  Allergies  Allergen Reactions   Bee Venom Hives, Shortness Of Breath and Swelling   Nabumetone Anaphylaxis   Amoxicillin-Pot Clavulanate Hives and Swelling    To lips.   Aspirin Hives   Atorvastatin Swelling     joint pain/swelling, inc liver tests   Clopidogrel Bisulfate Hives   Codeine Nausea And Vomiting   Ezetimibe Other (See Comments)     fatigue   Metformin And Related Other (See Comments)    Diarrhea    Valsartan Other (See Comments)     fatigue   Other Hives    **Red Meat**  SOB    Outpatient Encounter Medications as of 12/24/2021  Medication Sig   apixaban (ELIQUIS) 5 MG TABS tablet Take 1 tablet (5 mg total) by mouth 2 (two) times daily.   Blood Glucose Monitoring Suppl (ONE TOUCH ULTRA 2) w/Device KIT Use to check blood sugar once daily   Cyanocobalamin  (VITAMIN B-12 PO) Take 1 tablet by mouth daily.   diphenhydrAMINE (BENADRYL) 25 MG tablet Take 50 mg by mouth every 8 (eight) hours as needed for allergies.    EPINEPHrine (EPI-PEN) 0.3 mg/0.3 mL DEVI Inject 0.3 mg into the muscle daily as needed (allergic reaction).   fluticasone (FLONASE) 50 MCG/ACT nasal spray Place 2 sprays into both nostrils 2 (two) times daily as needed. Reported on 09/10/2015   glipiZIDE (GLUCOTROL XL) 10 MG 24 hr tablet TAKE 1 TABLET(10 MG) BY MOUTH DAILY WITH BREAKFAST   glucose blood (ONETOUCH ULTRA) test strip USE TO CHECK BLOOD SUGAR ONCE DAILY AS DIRECTED   isosorbide mononitrate (IMDUR) 60 MG 24 hr tablet TAKE 1 TABLET(60 MG) BY MOUTH DAILY   losartan-hydrochlorothiazide (HYZAAR) 50-12.5 MG tablet Take 0.5 tablet (25-6.25 mg) by mouth once daily.   metFORMIN (GLUCOPHAGE-XR) 500 MG 24 hr tablet TAKE 1 TABLET(500 MG) BY MOUTH DAILY WITH BREAKFAST   nitroGLYCERIN (NITROSTAT) 0.4 MG SL tablet Place 1 tablet (0.4 mg total) under the tongue every 5 (five) minutes as needed for chest pain (up to 3 doses. If taking 3rd dose call 911).   potassium chloride SA (KLOR-CON M) 20 MEQ tablet Take 1 tablet (20 mEq total) by mouth daily.   propranolol (INDERAL) 10 MG tablet Take 1 tablet (10 mg) by mouth every 1 hour x 3 doses as needed for chest pain/ fast heart rates   Facility-Administered Encounter Medications as of 12/24/2021  Medication   Study - ORION 4 -  inclisiran 300 mg/1.9m or placebo SQ injection ([facility])    Patient Active Problem List   Diagnosis Date Noted   Estrogen deficiency 11/07/2021   Grief reaction 05/08/2021   Elevated TSH 11/05/2020   Allergy to statin medication 03/26/2020   History of loop recorder 09/06/2019   History of seizure 03/23/2019   Left-sided epistaxis 11/17/2018   Medicare annual wellness visit, subsequent 09/13/2018   Screening mammogram, encounter for 09/13/2018   Herpes zoster 08/10/2018   Coronary artery disease 12/24/2017    Routine general medical examination at a health care facility 08/24/2016   PAF (paroxysmal atrial fibrillation) ([facility] 04/07/2016   Overweight (BMI 25.0-29.9) 08/23/2014   Type 2 diabetes mellitus with diabetic peripheral angiopathy without gangrene, without long-term current use of insulin ([facility] 01/12/2014   TIA (transient ischemic attack) 01/11/2014   History of CVA (cerebrovascular accident) 01/11/2014   LOOP Recorder LINQ 06/07/2013   Sinusitis, chronic 05/16/2013   H/O: CVA (cerebrovascular accident) 02/15/2013   Encounter for Medicare annual wellness exam 10/27/2012   Encopresis(307.7) 12/15/2011   Post-menopausal 10/28/2011   PERSONAL HX COLONIC POLYPS 09/04/2009   B12 deficiency 03/06/2009   ALLERGIC RHINITIS 02/14/2008   BACK PAIN, LUMBAR 11/03/2007   Hyperlipidemia associated with type 2 diabetes mellitus ([place] 11/24/2006   Essential hypertension 11/24/2006   GERD 10/09/2006   OVERACTIVE BLADDER 10/09/2006   INCONTINENCE, URGE 10/09/2006   SKIN CANCER, HX OF 10/09/2006    Conditions to be addressed/monitored:Atrial Fibrillation, CAD, HTN, and Grief  [facility] : [facility]  Updates made by GDannielle Karvonen RN since 12/24/2021 12:00 AM     Problem: Chronic disease management, education, and/ or care coordination needs.   Priority: High     Long-Range Goal: Development of plan of care to address chronic disease managment and/ or care coordination needs   Start Date: 06/04/2021  Expected End Date: 03/06/2022  Priority: High  Note:   Current Barriers:  Knowledge Deficits related to plan of care for management of Atrial Fibrillation, CAD, and HTN  Chronic Disease Management support and education needs related to Atrial Fibrillation, CAD, and HTN Patient reports having annual wellness visit with primary care provider on 11/07/21.  Patient states she is due follow up visit with cardiologist.  Reports today's blood pressure was 157/74 and pulse 58.  Patient states she has  not taken her blood pressure medication as of yet.  Patient reports overall takes medication as prescribed.  Patient states she recently had 2 skin cancer lesions removed.  Reports doing well from procedures.  Patient states her son recently had a car accident breaking neck and ribs.  She states he is recovering well.  She reports periods of grief due to lost of daughter.  Patient denies need for grief counseling.    RNCM Clinical Goal(s):  Patient will verbalize basic understanding of Atrial Fibrillation, CAD, and HTN disease process and self health management plan as evidenced by patient self report and/ or notation in chart take all medications exactly as prescribed and will call provider for medication related questions as evidenced by patient report and/ or notation in chart    attend all scheduled medical appointments:  as evidenced by patient report and/ or notation in chart        continue to work with RN Care Manager and/or Social Worker to address care management and care coordination needs related to Atrial Fibrillation, CAD, and HTN as evidenced by adherence to CM Team Scheduled appointments  through collaboration with Consulting civil engineer, provider, and care team.   Interventions: 1:1 collaboration with primary care provider regarding development and update of comprehensive plan of care as evidenced by provider attestation and co-signature Inter-disciplinary care team collaboration (see longitudinal plan of care) Evaluation of current treatment plan related to  self management and patient's adherence to plan as established by provider   AFIB Interventions: (Status:  Goal on track:  Yes.) Long Term Goal Reviewed importance of adherence to anticoagulant exactly as prescribed Counseled on bleeding risk associated with Eliquis and importance of self-monitoring for signs/symptoms of bleeding Advised to continue to monitor pulse rated and report any concerns to provider.  Counseled on  importance of regular laboratory monitoring as prescribed Advised to follow up with provider as recommended.  Reviewed patients action plan for atrial fibrillation   CAD Interventions: (Status:  Goal on track:  Yes.) Long Term Goal Medications reviewed including medications utilized in CAD treatment plan Reviewed Importance of taking all medications as prescribed Reviewed Importance of attending all scheduled provider appointments Advised to eat a more heart healthy diet. Discussed limiting / eliminating processed foods, high fat, and high salt Patient advised to scheduled follow up appointment with cardiologist.  Patient advised to continue to monitor blood pressure and record.    Hypertension Interventions:  (Status:  Goal on track:  Yes.) Long Term Goal Last practice recorded BP readings:  BP Readings from Last 3 Encounters:  11/07/21 128/76  08/08/21 130/64  05/08/21 128/84  Evaluation of current treatment plan related to hypertension self management and patient's adherence to plan as established by provider Reviewed medications with patient and discussed importance of compliance Discussed plans with patient for ongoing care management follow up and provided patient with direct contact information for care management team Reviewed scheduled/upcoming provider appointments  Advised patient to continue to monitor blood pressure weekly and record.  Continue to follow a low salt diet.   Grieving: ( Goal on track: Yes) Long term goal   Continue to allow patient to discuss  feels and needed.  Offered grief counseling to patient.    Patient Goals/Self-Care Activities: -  Continue to check pulse (heart) rate once a day  - Continue to monitor your blood pressure at least 1-2 times per week and record -  Continue to take your medications as prescribed and refill timely - follow up with your providers as recommended ( reminder your annual wellness visit is scheduled for 11/07/21) - Notify  your doctor of any new or ongoing symptoms - Follow a low salt, heart healthy diet ( limit processed, high fat, high salt food items from diet) -contact your RN case manager for social work follow up for grief counseling if needed. - call and schedule follow up visit with cardiologist.      Plan:The patient has been provided with contact information for the care management team and has been advised to call with any health related questions or concerns.  The care management team will reach out to the patient again over the next 2 months . Quinn Plowman RN,BSN,CCM RN Case Manager [place]  [phone]

## 2021-12-24 NOTE — Patient Instructions (Signed)
Visit Information  Thank you for taking time to visit with me today. Please don't hesitate to contact me if I can be of assistance to you before our next scheduled telephone appointment.  Following are the goals we discussed today:  -  Continue to check pulse (heart) rate once a day  - Continue to monitor your blood pressure at least 1-2 times per week and record -  Continue to take your medications as prescribed and refill timely - follow up with your providers as recommended ( reminder your annual wellness visit is scheduled for 11/07/21) - Notify your doctor of any new or ongoing symptoms - Follow a low salt, heart healthy diet ( limit processed, high fat, high salt food items from diet) -contact your RN case manager for social work follow up for grief counseling if needed. - call and schedule follow up visit with cardiologist.   Our next appointment is by telephone on 02/17/22 at 10:00am   Please call the care guide team at [phone] if you need to cancel or reschedule your appointment.   If you are experiencing a Mental Health or [place] or need someone to talk to, please call the Suicide and Crisis Lifeline: 988 call 1-800-273-TALK (toll free, 24 hour hotline)   Patient verbalizes understanding of instructions and care plan provided today and agrees to view in [place]. Active MyChart status and patient understanding of how to access instructions and care plan via MyChart confirmed with patient.     Quinn Plowman RN,BSN,CCM RN Case Manager [place]  [phone]

## 2022-01-03 DIAGNOSIS — Z634 Disappearance and death of family member: Secondary | ICD-10-CM | POA: Diagnosis not present

## 2022-01-03 DIAGNOSIS — I2510 Atherosclerotic heart disease of native coronary artery without angina pectoris: Secondary | ICD-10-CM | POA: Diagnosis not present

## 2022-01-03 DIAGNOSIS — I4891 Unspecified atrial fibrillation: Secondary | ICD-10-CM

## 2022-01-03 DIAGNOSIS — I10 Essential (primary) hypertension: Secondary | ICD-10-CM | POA: Diagnosis not present

## 2022-01-03 DIAGNOSIS — F4321 Adjustment disorder with depressed mood: Secondary | ICD-10-CM

## 2022-02-10 ENCOUNTER — Telehealth: Payer: Self-pay

## 2022-02-10 NOTE — Telephone Encounter (Signed)
  Care Management   Follow Up Note   02/10/2022 Name: Margaret Vazquez MRN: [id number] DOB: Mar 29, 1936   Referred by: Tower, [name], MD Reason for referral : Care Coordination   An unsuccessful telephone outreach was attempted today. The patient was referred to the case management team for assistance with care management and care coordination.   Follow Up Plan: The care management team will reach out to the patient again over the next 14 days.  Quinn Plowman RN,BSN,CCM RN Care Manager Coordinator [place] [phone]

## 2022-02-17 ENCOUNTER — Telehealth: Payer: HMO

## 2022-02-20 ENCOUNTER — Telehealth: Payer: Self-pay | Admitting: Family Medicine

## 2022-02-20 ENCOUNTER — Ambulatory Visit: Payer: HMO | Admitting: Internal Medicine

## 2022-02-20 ENCOUNTER — Encounter: Payer: Self-pay | Admitting: Internal Medicine

## 2022-02-20 VITALS — BP 120/60 | HR 66 | Ht 68.0 in | Wt 193.2 lb

## 2022-02-20 DIAGNOSIS — E1151 Type 2 diabetes mellitus with diabetic peripheral angiopathy without gangrene: Secondary | ICD-10-CM | POA: Diagnosis not present

## 2022-02-20 DIAGNOSIS — D692 Other nonthrombocytopenic purpura: Secondary | ICD-10-CM | POA: Diagnosis not present

## 2022-02-20 DIAGNOSIS — R0989 Other specified symptoms and signs involving the circulatory and respiratory systems: Secondary | ICD-10-CM | POA: Diagnosis not present

## 2022-02-20 DIAGNOSIS — R200 Anesthesia of skin: Secondary | ICD-10-CM | POA: Diagnosis not present

## 2022-02-20 DIAGNOSIS — I480 Paroxysmal atrial fibrillation: Secondary | ICD-10-CM

## 2022-02-20 DIAGNOSIS — I10 Essential (primary) hypertension: Secondary | ICD-10-CM

## 2022-02-20 DIAGNOSIS — Z7984 Long term (current) use of oral hypoglycemic drugs: Secondary | ICD-10-CM | POA: Diagnosis not present

## 2022-02-20 DIAGNOSIS — I639 Cerebral infarction, unspecified: Secondary | ICD-10-CM

## 2022-02-20 DIAGNOSIS — Z7982 Long term (current) use of aspirin: Secondary | ICD-10-CM | POA: Diagnosis not present

## 2022-02-20 DIAGNOSIS — D6869 Other thrombophilia: Secondary | ICD-10-CM | POA: Diagnosis not present

## 2022-02-20 MED ORDER — AMLODIPINE BESYLATE 2.5 MG PO TABS: ORAL_TABLET | ORAL | 3 refills | Status: DC

## 2022-02-20 MED ORDER — ROSUVASTATIN CALCIUM 5 MG PO TABS: ORAL_TABLET | ORAL | 1 refills | Status: DC

## 2022-02-20 NOTE — Progress Notes (Signed)
Patient Care Team: Tower, [name], MD as PCP - General (Family Medicine) Deboraha Sprang, MD as PCP - Cardiology (Cardiology) Deboraha Sprang, MD as PCP - Electrophysiology (Cardiology) Dasher, Rayvon Char, MD as Consulting Physician (Dermatology) Salomon Fick., MD as Referring Physician (Dentistry) Leandrew Koyanagi, MD as Referring Physician (Ophthalmology) Dannielle Karvonen, RN as Case Manager   HPI  Margaret Vazquez is a 86 y.o. female seen in follow-up for chronotropic incompetence and exercise intolerance.  Peak heart rate in the office was 73.  Has been deferring a discussion of pacing for almost a year.  Undertook modified treadmill 8/22 heart rate 64--104  8/14 loop recorder implanted for cryptogenic stroke.  Atrial fibrillation identified now on anticoagulation with apixaban.  No significant  bleeding   2019 coronary artery disease-CT>> abnormal FFR; circumflex was stented      She has Carotid disease but has been intolerant of statins and has been referred for PCSK9 therapy  The patient denies nocturnal dyspnea, orthopnea or peripheral edema.  There have been no palpitations, lightheadedness or syncope.  Complains of dyspnea on exertion accompanied by chest discomfort.  There is also back discomfort with walking.  All are relieved by rest.  Chest discomfort is new, having been relieved following cardiac PCI 6/19.  Notably interval Myoview was nondiagnostic/negative.   Describes transient numbness in her left hand.  Not necessarily nocturnal.  Saw her dentist earlier this week, panoramic x-ray demonstrated interval development of right neck calcification  DATE TEST EF   6/19 LHC 55-65% CXp/m-80>>stent RCAm 30%  10/21 Echo   65-70 %   2/23 Myoview hyperdynamic No ischemia        Date Cr K TSH Hgb LDL  9/21 0.98 3.9  15.0    3/22 0.9 4.2 5.14 14.3   10/22 1.1 4.0 2.21    4/23 0.99 4.1 3.02 14.2 170   Thromboembolic risk factors ( age  -2, HTN-1,  TIA/CVA-2, DM-1Vasc disease -1, Gender-1) for a CHADSVASc Score of >=8      Past Medical History:  Diagnosis Date   Allergy history, drug    Aspirin   Basal cell carcinoma    "back"   CAD (coronary artery disease)    a. Previously nonobstructive then progressive angina with abnl CT -> cardiac cath 12/24/17 showed 30% mid RCA and 80% prox-mid Cx. She received DES to mid AV groove Cx. EF 55-65%.    Cervical stenosis of spine    With neck pain   CKD (chronic kidney disease), stage II    Colon polyps 0174   Complication of anesthesia 1980s   slow to wake after anesthesia "when I had breast biopsy"   GERD (gastroesophageal reflux disease)    Hyperlipidemia    myalgias with Lipitor and Zetia   Hypertension    Migraine    "stopped at age 26" (12/24/2017)   Squamous carcinoma    "iced off and cut off; mostly arms" (12/24/2017)   Stroke [facility])    "told me I'd had 2 strokes in 02/2013"; denies residual on 12/24/2017   TIA (transient ischemic attack)    Type II diabetes mellitus ([place])     Past Surgical History:  Procedure Laterality Date   ABD [place]  07/2003   Negative   APPENDECTOMY     BASAL CELL CARCINOMA EXCISION     "back"   BREAST BIOPSY Left 1992   benign   CARDIAC CATHETERIZATION  04/07/2011   non  obst CAD (Dr Burt Knack)   CATARACT EXTRACTION W/PHACO Left 04/01/2017   Procedure: CATARACT EXTRACTION PHACO AND INTRAOCULAR LENS PLACEMENT ([place]) LEFT DIABETIC;  Surgeon: Leandrew Koyanagi, MD;  Location: [place];  Service: Ophthalmology;  Laterality: Left;  Diabetic - oral meds   CATARACT EXTRACTION W/PHACO Right 04/29/2017   Procedure: CATARACT EXTRACTION PHACO AND INTRAOCULAR LENS PLACEMENT ([place]) RIGHT DIABETIC;  Surgeon: Leandrew Koyanagi, MD;  Location: [place];  Service: Ophthalmology;  Laterality: Right;  Diabetic - oral meds   COLONOSCOPY  12/2007   Adenomatous colon polyps   CORONARY ANGIOPLASTY WITH STENT PLACEMENT  12/24/2017   CORONARY STENT  INTERVENTION N/A 12/24/2017   Procedure: CORONARY STENT INTERVENTION;  Surgeon: Lorretta Harp, MD;  Location: [place] CV LAB;  Service: Cardiovascular;  Laterality: N/A;   CYSTOSCOPY W/ DECANNULATION  03/2000   Normal   LEFT HEART CATH AND CORONARY ANGIOGRAPHY N/A 12/24/2017   Procedure: LEFT HEART CATH AND CORONARY ANGIOGRAPHY;  Surgeon: Lorretta Harp, MD;  Location: [place] CV LAB;  Service: Cardiovascular;  Laterality: N/A;   LOOP RECORDER IMPLANT N/A 02/16/2013   Procedure: LOOP RECORDER IMPLANT;  Surgeon: Deboraha Sprang, MD;  [facility];  Service: Cardiovascular;  Laterality: N/A;   MOHS SURGERY     right hand    NASAL SINUS SURGERY  01/2005   SQUAMOUS CELL CARCINOMA EXCISION     "mostly arms;" (12/24/2017)   STRESS CARDIOLITE  11/1999   Normal/ negative   TEAR DUCT PROBING  2005   "? side"   TEE WITHOUT CARDIOVERSION N/A 02/16/2013   Procedure: TRANSESOPHAGEAL ECHOCARDIOGRAM (TEE);  Surgeon: Josue Hector, MD;  [facility] ENDOSCOPY;  Service: Cardiovascular;  Laterality: N/A;   TUBAL LIGATION     BTL    Current Outpatient Medications  Medication Sig Dispense Refill   apixaban (ELIQUIS) 5 MG TABS tablet Take 1 tablet (5 mg total) by mouth 2 (two) times daily. 60 tablet 5   Blood Glucose Monitoring Suppl (ONE TOUCH ULTRA 2) w/Device KIT Use to check blood sugar once daily 1 each 0   Cyanocobalamin (VITAMIN B-12 PO) Take 1 tablet by mouth daily.     diphenhydrAMINE (BENADRYL) 25 MG tablet Take 50 mg by mouth every 8 (eight) hours as needed for allergies.      EPINEPHrine (EPI-PEN) 0.3 mg/0.3 mL DEVI Inject 0.3 mg into the muscle daily as needed (allergic reaction).     fluticasone (FLONASE) 50 MCG/ACT nasal spray Place 2 sprays into both nostrils 2 (two) times daily as needed. Reported on 09/10/2015     glipiZIDE (GLUCOTROL XL) 10 MG 24 hr tablet TAKE 1 TABLET(10 MG) BY MOUTH DAILY WITH BREAKFAST 90 tablet 3   glucose blood (ONETOUCH ULTRA) test strip USE TO  CHECK BLOOD SUGAR ONCE DAILY AS DIRECTED 100 strip 1   isosorbide mononitrate (IMDUR) 60 MG 24 hr tablet TAKE 1 TABLET(60 MG) BY MOUTH DAILY 90 tablet 3   losartan-hydrochlorothiazide (HYZAAR) 50-12.5 MG tablet Take 0.5 tablet (25-6.25 mg) by mouth once daily. 45 tablet 2   metFORMIN (GLUCOPHAGE-XR) 500 MG 24 hr tablet TAKE 1 TABLET(500 MG) BY MOUTH DAILY WITH BREAKFAST 90 tablet 3   nitroGLYCERIN (NITROSTAT) 0.4 MG SL tablet Place 1 tablet (0.4 mg total) under the tongue every 5 (five) minutes as needed for chest pain (up to 3 doses. If taking 3rd dose call 911). 25 tablet 3   potassium chloride SA (KLOR-CON M) 20 MEQ tablet Take 1 tablet (20 mEq  total) by mouth daily. 90 tablet 3   propranolol (INDERAL) 10 MG tablet Take 1 tablet (10 mg) by mouth every 1 hour x 3 doses as needed for chest pain/ fast heart rates 30 tablet 1   Current Facility-Administered Medications  Medication Dose Route Frequency Provider Last Rate Last Admin   Study - ORION 4 - inclisiran 300 mg/1.61m or placebo SQ injection ([facility])  300 mg Subcutaneous Q6 months SHillary Bow MD        Allergies  Allergen Reactions   Bee Venom Hives, Shortness Of Breath and Swelling   Nabumetone Anaphylaxis   Amoxicillin-Pot Clavulanate Hives and Swelling    To lips.   Aspirin Hives   Atorvastatin Swelling     joint pain/swelling, inc liver tests   Clopidogrel Bisulfate Hives   Codeine Nausea And Vomiting   Ezetimibe Other (See Comments)     fatigue   Metformin And Related Other (See Comments)    Diarrhea    Valsartan Other (See Comments)     fatigue   Other Hives    **Red Meat**  SOB    Review of Systems negative except from HPI and PMH  Physical Exam BP 120/60 (BP Location: Left Arm, Patient Position: Sitting, Cuff Size: Normal)   Pulse 66   Ht _0  (1.727 m)   Wt 193 lb 4 oz (87.7 kg)   SpO2 97%   BMI 29.38 kg/m  Well developed and nourished in no acute distress HENT normal Neck supple with JVP-  flat    Carotid bruits bilateral left greater than right Clear Regular rate and rhythm, no murmurs or gallops Abd-soft with active BS No Clubbing cyanosis edema Skin-warm and dry A & Oriented  Grossly normal sensory and motor function  ECG sinus at 66 Interval 16/07/39 Assessment and  Plan  Cryptogenic stroke  Implantable loop recorder end of service  Elevated blood pressure  Coronary disease s/p Cx stenting  Dyspnea on exertion  Exertional chest pain consistent with angina-persistent  Left hand numbness  Right neck   calcification  Atrial fibrillation paroxysmal   Cholesterol-lowering therapy intolerant (statins, ezetimibe, PCSK9)  Sinus bradycardia w chronotropic incompetence    No clinical interval atrial fibrillation.  No significant bleeding.  Continue Eliquis 5 mg twice daily.  Continues with exertional shortness of breath and chest discomfort.  CTA is appropriate given her stenting; Myoview was negative.  Think she probably needs a catheterization but as the benefits are for symptoms, we will augment medical therapy and see if this is sufficient.  She is already on isosorbide.  We will add amlodipine 2.5.  Beta-blockers would be a next option.  With her hyperlipidemia and vascular disease, we will try again with statin therapy now trying 5 mg rosuvastatin 2 times a week.  With her right left hand symptoms and right neck calcification (I called and spoke to her dentist) we will undertake carotid Dopplers  We will also reach out to her PCP to see if we can get her on SGLT2

## 2022-02-20 NOTE — Patient Instructions (Addendum)
Medication Instructions:  - Your physician has recommended you make the following change in your medication:   1) START Norvasc (Amlodipine) 2.5 mg: - take 1 tablet by mouth once daily   2) START Crestor (Rosuvastatin) 5 mg: - take 1 tablet by mouth twice a week  *If you need a refill on your cardiac medications before your next appointment, please call your pharmacy*   Lab Work: - none ordered  If you have labs (blood work) drawn today and your tests are completely normal, you will receive your results only by: [place] (if you have MyChart) OR A paper copy in the mail If you have any lab test that is abnormal or we need to change your treatment, we will call you to review the results.   Testing/Procedures: - Your physician has requested that you have a carotid duplex. This test is an ultrasound of the carotid arteries in your neck. It looks at blood flow through these arteries that supply the brain with blood. Allow one hour for this exam. There are no restrictions or special instructions.    Follow-Up: At [facility], you and your health needs are our priority.  As part of our continuing mission to provide you with exceptional heart care, we have created designated Provider Care Teams.  These Care Teams include your primary Cardiologist (physician) and Advanced Practice Providers (APPs -  Physician Assistants and Nurse Practitioners) who all work together to provide you with the care you need, when you need it.  We recommend signing up for the patient portal called "MyChart".  Sign up information is provided on this After Visit Summary.  MyChart is used to connect with patients for Virtual Visits (Telemedicine).  Patients are able to view lab/test results, encounter notes, upcoming appointments, etc.  Non-urgent messages can be sent to your provider as well.   To learn more about what you can do with MyChart, go to [email or web address].    Your next appointment:   1)  2-3  month(s) the PA/ NP  2) 1 year with Dr. Caryl Comes  The format for your next appointment:   In Person  Provider:   As above     Other Instructions Amlodipine Tablets What is this medication? AMLODIPINE (am LOE di peen) treats high blood pressure and prevents chest pain (angina). It works by relaxing the blood vessels, which helps decrease the amount of work your heart has to do. It belongs to a group of medications called calcium channel blockers. This medicine may be used for other purposes; ask your health care provider or pharmacist if you have questions. COMMON BRAND NAME(S): Norvasc What should I tell my care team before I take this medication? They need to know if you have any of these conditions: Heart disease Liver disease An unusual or allergic reaction to amlodipine, other medications, foods, dyes, or preservatives Pregnant or trying to get pregnant Breast-feeding How should I use this medication? Take this medication by mouth. Take it as directed on the prescription label at the same time every day. You can take it with or without food. If it upsets your stomach, take it with food. Keep taking it unless your care team tells you to stop. Talk to your care team about the use of this medication in children. While it may be prescribed for children as young as 6 for selected conditions, precautions do apply. Overdosage: If you think you have taken too much of this medicine contact a poison control center or emergency  room at once. NOTE: This medicine is only for you. Do not share this medicine with others. What if I miss a dose? If you miss a dose, take it as soon as you can. If it is almost time for your next dose, take only that dose. Do not take double or extra doses. What may interact with this medication? Clarithromycin Cyclosporine Diltiazem Itraconazole Simvastatin Tacrolimus This list may not describe all possible interactions. Give your health care provider a list  of all the medicines, herbs, non-prescription drugs, or dietary supplements you use. Also tell them if you smoke, drink alcohol, or use illegal drugs. Some items may interact with your medicine. What should I watch for while using this medication? Visit your care team for regular checks on your progress. Check your blood pressure as directed. Know what your blood pressure should be and when to contact your care team. Do not treat yourself for coughs, colds, or pain while you are using this medication without asking your care team for advice. Some medications may increase your blood pressure. This medication may affect your coordination, reaction time, or judgment. Do not drive or operate machinery until you know how this medication affects you. Sit up or stand slowly to reduce the risk of dizzy or fainting spells. Drinking alcohol with this medication can increase the risk of these side effects. What side effects may I notice from receiving this medication? Side effects that you should report to your care team as soon as possible: Allergic reactions--skin rash, itching, hives, swelling of the face, lips, tongue, or throat Heart attack--pain or tightness in the chest, shoulders, arms, or jaw, nausea, shortness of breath, cold or clammy skin, feeling faint or lightheaded Low blood pressure--dizziness, feeling faint or lightheaded, blurry vision Worsening chest pain (angina)--pain, pressure, or tightness in the chest, neck, back, or arms Side effects that usually do not require medical attention (report these to your care team if they continue or are bothersome): Facial flushing, redness Heart palpitations--rapid, pounding, or irregular heartbeat Nausea Stomach pain Swelling of the ankles, hands, or feet This list may not describe all possible side effects. Call your doctor for medical advice about side effects. You may report side effects to FDA at 1-800-FDA-1088. Where should I keep my  medication? Keep out of the reach of children and pets. Store at room temperature between 20 and 25 degrees C (68 and 77 degrees F). Protect from light and moisture. Keep the container tightly closed. Get rid of any unused medication after the expiration date. To get rid of medications that are no longer needed or have expired: Take the medication to a medication take-back program. Check with your pharmacy or law enforcement to find a location. If you cannot return the medication, check the label or package insert to see if the medication should be thrown out in the garbage or flushed down the toilet. If you are not sure, ask your health care provider. If it is safe to put in the trash, empty the medication out of the container. Mix the medication with cat litter, dirt, coffee grounds, or other unwanted substance. Seal the mixture in a bag or container. Put it in the trash. NOTE: This sheet is a summary. It may not cover all possible information. If you have questions about this medicine, talk to your doctor, pharmacist, or health care provider.  2023 Elsevier/Gold Standard (2020-05-03 00:00:00)    Rosuvastatin Tablets What is this medication? ROSUVASTATIN (roe SOO va sta tin) treats high cholesterol  and reduces the risk of heart attack and stroke. It works by decreasing bad cholesterol and fats (such as LDL, triglycerides) and increasing good cholesterol (HDL) in your blood. It belongs to a group of medications called statins. Changes to diet and exercise are often combined with this medication. This medicine may be used for other purposes; ask your health care provider or pharmacist if you have questions. COMMON BRAND NAME(S): Crestor What should I tell my care team before I take this medication? They need to know if you have any of these conditions: Diabetes Frequently drink alcohol Kidney disease Liver disease Muscle cramps, pain Stroke Thyroid disease An unusual or allergic reaction to  rosuvastatin, other medications, foods, dyes, or preservatives Pregnant or trying to get pregnant Breastfeeding How should I use this medication? Take this medication by mouth with a glass of water. Follow the directions on the prescription label. You can take it with or without food. If it upsets your stomach, take it with food. Do not cut, crush or chew this medication. Swallow the tablets whole. Take your medication at regular intervals. Do not take it more often than directed. Take antacids that have a combination of aluminum and magnesium hydroxide in them at a different time of day than this medication. Take these products 2 hours AFTER this medication. Talk to your care team about the use of this medication in children. While this medication may be prescribed for children as young as 7 for selected conditions, precautions do apply. Overdosage: If you think you have taken too much of this medicine contact a poison control center or emergency room at once. NOTE: This medicine is only for you. Do not share this medicine with others. What if I miss a dose? If you miss a dose, take it as soon as you can. If your next dose is to be taken in less than 12 hours, then do not take the missed dose. Take the next dose at your regular time. Do not take double or extra doses. What may interact with this medication? Do not take this medication with any of the following: Supplements like red yeast rice This medication may also interact with the following: Alcohol Antacids containing aluminum hydroxide and magnesium hydroxide Cyclosporine Other medications for high cholesterol Some medications for HIV infection Warfarin This list may not describe all possible interactions. Give your health care provider a list of all the medicines, herbs, non-prescription drugs, or dietary supplements you use. Also tell them if you smoke, drink alcohol, or use illegal drugs. Some items may interact with your  medicine. What should I watch for while using this medication? Visit your care team for regular checks on your progress. Tell your care team if your symptoms do not start to get better or if they get worse. Your care team may tell you to stop taking this medication if you develop muscle problems. If your muscle problems do not go away after stopping this medication, contact your care team. Talk to your care team if you may be pregnant. Serious birth defects can occur if you take this medication during pregnancy. Talk to your care team before breastfeeding. Changes to your treatment plan may be needed. This medication may increase blood sugar. Ask your care team if changes in diet or medications are needed if you have diabetes. If you are going to need surgery or other procedure, tell your care team that you are using this medication. Taking this medication is only part of a total heart  healthy program. Ask your care team if there are other changes you can make to improve your overall health. What side effects may I notice from receiving this medication? Side effects that you should report to your care team as soon as possible: Allergic reactions--skin rash, itching, hives, swelling of the face, lips, tongue, or throat High blood sugar (hyperglycemia)--increased thirst or amount of urine, unusual weakness, fatigue, blurry vision Liver injury--right upper belly pain, loss of appetite, nausea, light-colored stool, dark yellow or brown urine, yellowing skin or eyes, unusual weakness, fatigue Muscle injury--unusual weakness, fatigue, muscle pain, dark yellow or brown urine, decrease in amount of urine Redness, blistering, peeling or loosening of the skin, including inside the mouth Side effects that usually do not require medical attention (report to your care team if they continue or are bothersome): Fatigue Headache Nausea Stomach pain This list may not describe all possible side effects. Call your  doctor for medical advice about side effects. You may report side effects to FDA at 1-800-FDA-1088. Where should I keep my medication? Keep out of the reach of children and pets. Store between 20 and 25 degrees C (68 and 77 degrees F). Get rid of any unused medication after the expiration date. To get rid of medications that are no longer needed or have expired: Take the medication to a medication take-back program. Check with your pharmacy or law enforcement to find a location. If you cannot return the medication, check the label or package insert to see if the medication should be thrown out in the garbage or flushed down the toilet. If you are not sure, ask your care team. If it is safe to put it in the trash, take the medication out of the container. Mix the medication with cat litter, dirt, coffee grounds, or other unwanted substance. Seal the mixture in a bag or container. Put it in the trash. NOTE: This sheet is a summary. It may not cover all possible information. If you have questions about this medicine, talk to your doctor, pharmacist, or health care provider.  2023 Elsevier/Gold Standard (2020-05-05 00:00:00)    Important Information About Sugar

## 2022-02-20 NOTE — Telephone Encounter (Signed)
Cardiology reached out to me to consider starting pt on a new diabetes medicine that is good for the heart  Would you please call her to set up an appt to discuss it?   Thanks

## 2022-02-20 NOTE — Telephone Encounter (Signed)
Called and lvm for patient to call us back to make an appt with Dr Glori Bickers to discuss medication.

## 2022-02-21 ENCOUNTER — Ambulatory Visit: Payer: HMO

## 2022-02-21 ENCOUNTER — Encounter: Payer: Self-pay | Admitting: Family Medicine

## 2022-02-21 ENCOUNTER — Ambulatory Visit (INDEPENDENT_AMBULATORY_CARE_PROVIDER_SITE_OTHER): Payer: HMO | Admitting: Family Medicine

## 2022-02-21 VITALS — BP 136/70 | HR 76 | Temp 98.6°F | Resp 16 | Ht 68.0 in | Wt 193.6 lb

## 2022-02-21 DIAGNOSIS — I2510 Atherosclerotic heart disease of native coronary artery without angina pectoris: Secondary | ICD-10-CM | POA: Diagnosis not present

## 2022-02-21 DIAGNOSIS — E1151 Type 2 diabetes mellitus with diabetic peripheral angiopathy without gangrene: Secondary | ICD-10-CM

## 2022-02-21 DIAGNOSIS — E1169 Type 2 diabetes mellitus with other specified complication: Secondary | ICD-10-CM | POA: Diagnosis not present

## 2022-02-21 DIAGNOSIS — I10 Essential (primary) hypertension: Secondary | ICD-10-CM

## 2022-02-21 DIAGNOSIS — E785 Hyperlipidemia, unspecified: Secondary | ICD-10-CM

## 2022-02-21 DIAGNOSIS — E2839 Other primary ovarian failure: Secondary | ICD-10-CM

## 2022-02-21 MED ORDER — EMPAGLIFLOZIN 10 MG PO TABS: 10.0000 mg | ORAL_TABLET | Freq: Every day | ORAL | 3 refills | Status: DC

## 2022-02-21 NOTE — Patient Instructions (Signed)
Visit Information  Thank you for taking time to visit with me today. Please don't hesitate to contact me if I can be of assistance to you before our next scheduled telephone appointment.  Following are the goals we discussed today:  -  Continue to check pulse (heart) rate once a day  - Continue to monitor your blood pressure at least 1-2 times per week and record -  Continue to take your medications as prescribed and refill timely - follow up with your providers as recommended  - Notify your doctor of any new or ongoing symptoms - Follow a low salt, heart healthy diet ( limit processed, high fat, high salt food items from diet) Monitor blood sugars daily. Report blood sugars<70 or greater than 180-200 to provider.   Our next appointment is by telephone on 03/27/22 at 11:00 am  Please call the care guide team at [phone] if you need to cancel or reschedule your appointment.   If you are experiencing a Mental Health or [place] or need someone to talk to, please call the Suicide and Crisis Lifeline: 988 call 1-800-273-TALK (toll free, 24 hour hotline)   Patient verbalizes understanding of instructions and care plan provided today and agrees to view in [facility]. Active MyChart status and patient understanding of how to access instructions and care plan via MyChart confirmed with patient.     Quinn Plowman RN,BSN,CCM RN Care Manager Coordinator [phone]

## 2022-02-21 NOTE — Chronic Care Management (AMB) (Signed)
Care Management    RN Visit Note  02/21/2022 Name: Margaret Vazquez MRN: [id number] DOB: 1936/05/23  Subjective: Margaret Vazquez is a 86 y.o. year old female who is a primary care patient of Tower, Margaret Fanny, MD. The care management team was consulted for assistance with disease management and care coordination needs.    Engaged with patient by telephone for follow up visit in response to provider referral for case management and/or care coordination services.   Consent to Services:   Ms. Kuznicki was given information about Care Management services today including:  Care Management services includes personalized support from designated clinical staff supervised by her physician, including individualized plan of care and coordination with other care providers 24/7 contact phone numbers for assistance for urgent and routine care needs. The patient may stop case management services at any time by phone call to the office staff.  Patient agreed to services and consent obtained.   Assessment: Review of patient past medical history, allergies, medications, health status, including review of consultants reports, laboratory and other test data, was performed as part of comprehensive evaluation and provision of chronic care management services.   SDOH (Social Determinants of Health) assessments and interventions performed:    Care Plan  Allergies  Allergen Reactions   Bee Venom Hives, Shortness Of Breath and Swelling   Nabumetone Anaphylaxis   Amoxicillin-Pot Clavulanate Hives and Swelling    To lips.   Aspirin Hives   Atorvastatin Swelling     joint pain/swelling, inc liver tests   Clopidogrel Bisulfate Hives   Codeine Nausea And Vomiting   Ezetimibe Other (See Comments)     fatigue   Metformin And Related Other (See Comments)    Diarrhea    Valsartan Other (See Comments)     fatigue   Other Hives    **Red Meat**  SOB    Outpatient Encounter Medications as of 02/21/2022  Medication  Sig   amLODipine (NORVASC) 2.5 MG tablet Take 1 tablet (2.5 mg) by mouth once daily   apixaban (ELIQUIS) 5 MG TABS tablet Take 1 tablet (5 mg total) by mouth 2 (two) times daily.   Blood Glucose Monitoring Suppl (ONE TOUCH ULTRA 2) w/Device KIT Use to check blood sugar once daily   Cyanocobalamin (VITAMIN B-12 PO) Take 1 tablet by mouth daily.   diphenhydrAMINE (BENADRYL) 25 MG tablet Take 50 mg by mouth every 8 (eight) hours as needed for allergies.    empagliflozin (JARDIANCE) 10 MG TABS tablet Take 1 tablet (10 mg total) by mouth daily.   EPINEPHrine (EPI-PEN) 0.3 mg/0.3 mL DEVI Inject 0.3 mg into the muscle daily as needed (allergic reaction).   fluticasone (FLONASE) 50 MCG/ACT nasal spray Place 2 sprays into both nostrils 2 (two) times daily as needed. Reported on 09/10/2015   glipiZIDE (GLUCOTROL XL) 10 MG 24 hr tablet TAKE 1 TABLET(10 MG) BY MOUTH DAILY WITH BREAKFAST (Patient not taking: Reported on 02/21/2022)   glucose blood (ONETOUCH ULTRA) test strip USE TO CHECK BLOOD SUGAR ONCE DAILY AS DIRECTED   isosorbide mononitrate (IMDUR) 60 MG 24 hr tablet TAKE 1 TABLET(60 MG) BY MOUTH DAILY   losartan-hydrochlorothiazide (HYZAAR) 50-12.5 MG tablet Take 0.5 tablet (25-6.25 mg) by mouth once daily.   metFORMIN (GLUCOPHAGE-XR) 500 MG 24 hr tablet TAKE 1 TABLET(500 MG) BY MOUTH DAILY WITH BREAKFAST   nitroGLYCERIN (NITROSTAT) 0.4 MG SL tablet Place 1 tablet (0.4 mg total) under the tongue every 5 (five) minutes as needed for chest pain (up  to 3 doses. If taking 3rd dose call 911).   potassium chloride SA (KLOR-CON M) 20 MEQ tablet Take 1 tablet (20 mEq total) by mouth daily.   propranolol (INDERAL) 10 MG tablet Take 1 tablet (10 mg) by mouth every 1 hour x 3 doses as needed for chest pain/ fast heart rates   rosuvastatin (CRESTOR) 5 MG tablet Take 1 tablet (5 mg) by mouth twice a week   Facility-Administered Encounter Medications as of 02/21/2022  Medication   Study - ORION 4 - inclisiran 300  mg/1.41m or placebo SQ injection (PI-[facility])    Patient Active Problem List   Diagnosis Date Noted   Estrogen deficiency 11/07/2021   Grief reaction 05/08/2021   Elevated TSH 11/05/2020   Allergy to statin medication 03/26/2020   History of loop recorder 09/06/2019   History of seizure 03/23/2019   Left-sided epistaxis 11/17/2018   Medicare annual wellness visit, subsequent 09/13/2018   Screening mammogram, encounter for 09/13/2018   Herpes zoster 08/10/2018   Coronary artery disease 12/24/2017   Routine general medical examination at a health care facility 08/24/2016   PAF (paroxysmal atrial fibrillation) ([facility] 04/07/2016   Overweight (BMI 25.0-29.9) 08/23/2014   Type 2 diabetes mellitus with diabetic peripheral angiopathy without gangrene, without long-term current use of insulin ([facility] 01/12/2014   TIA (transient ischemic attack) 01/11/2014   History of CVA (cerebrovascular accident) 01/11/2014   LOOP Recorder LINQ 06/07/2013   Sinusitis, chronic 05/16/2013   H/O: CVA (cerebrovascular accident) 02/15/2013   Encounter for Medicare annual wellness exam 10/27/2012   Encopresis(307.7) 12/15/2011   Post-menopausal 10/28/2011   PERSONAL HX COLONIC POLYPS 09/04/2009   B12 deficiency 03/06/2009   ALLERGIC RHINITIS 02/14/2008   BACK PAIN, LUMBAR 11/03/2007   Hyperlipidemia associated with type 2 diabetes mellitus ([facility] 11/24/2006   Essential hypertension 11/24/2006   GERD 10/09/2006   OVERACTIVE BLADDER 10/09/2006   INCONTINENCE, URGE 10/09/2006   SKIN CANCER, HX OF 10/09/2006    Conditions to be addressed/monitored: Atrial Fibrillation, CAD, and HTN  [facility]  Updates made by GDannielle Karvonen RN since 02/21/2022 12:00 AM     Problem: Chronic disease management, education, and/ or care coordination needs.   Priority: High     Long-Range Goal: Development of plan of care to address chronic disease managment and/ or care coordination needs   Start Date:  06/04/2021  Expected End Date: 03/06/2022  Priority: High  Note:   Resolving due to duplicate goal  Current Barriers:  Knowledge Deficits related to plan of care for management of Atrial Fibrillation, CAD, and HTN  Chronic Disease Management support and education needs related to Atrial Fibrillation, CAD, and HTN Patient reports having follow up with primary care provider today. She states medication was changed from glipizide to jardiance. Patient reports having follow up with cardiologist on 02/20/22. Added rosuvastatin and amlodipine to treatment plan and carotid study ordered for 03/25/22.  Patient denies shortness of breath or increase heart rate.  She reports her blood pressures have ranged from 130-140's/ 50-60's.     RNCM Clinical Goal(s):  Patient will verbalize basic understanding of Atrial Fibrillation, CAD, and HTN disease process and self health management plan as evidenced by patient self report and/ or notation in chart take all medications exactly as prescribed and will call provider for medication related questions as evidenced by patient report and/ or notation in chart    attend all scheduled medical appointments:  as evidenced by patient report and/ or  notation in chart        continue to work with Consulting civil engineer and/or Social Worker to address care management and care coordination needs related to Atrial Fibrillation, CAD, and HTN as evidenced by adherence to CM Team Scheduled appointments     through collaboration with RN Care manager, provider, and care team.   Interventions: 1:1 collaboration with primary care provider regarding development and update of comprehensive plan of care as evidenced by provider attestation and co-signature Inter-disciplinary care team collaboration (see longitudinal plan of care) Evaluation of current treatment plan related to  self management and patient's adherence to plan as established by provider   AFIB Interventions: (Status:  Resolving due  to duplicate goal Reviewed importance of adherence to anticoagulant exactly as prescribed Counseled on bleeding risk associated with Eliquis and importance of self-monitoring for signs/symptoms of bleeding Advised to continue to monitor pulse rated and report any concerns to provider.  Counseled on importance of regular laboratory monitoring as prescribed Advised to follow up with provider as recommended.  Reviewed patients action plan for atrial fibrillation   CAD Interventions: (Status:  Resolving due to duplicate goal Medications reviewed including medications utilized in CAD treatment plan Reviewed Importance of taking all medications as prescribed Reviewed Importance of attending all scheduled provider appointments Advised to eat a more heart healthy diet. Discussed limiting / eliminating processed foods, high fat, and high salt Patient advised to scheduled follow up appointment with cardiologist.  Patient advised to continue to monitor blood pressure and record.    Hypertension Interventions:  (Status:  Resolving due to duplicate goal Last practice recorded BP readings:  BP Readings from Last 3 Encounters:  02/21/22 136/70  02/20/22 120/60  11/07/21 128/76  Evaluation of current treatment plan related to hypertension self management and patient's adherence to plan as established by provider Reviewed medications with patient and discussed importance of compliance Discussed plans with patient for ongoing care management follow up and provided patient with direct contact information for care management team Reviewed scheduled/upcoming provider appointments  Advised patient to continue to monitor blood pressure weekly and record.  Continue to follow a low salt diet.   Grieving: ( Goal met)   Continue to allow patient to discuss  feels and needed.  Offered grief counseling to patient.    Patient Goals/Self-Care Activities: -  Continue to check pulse (heart) rate once a day  -  Continue to monitor your blood pressure at least 1-2 times per week and record -  Continue to take your medications as prescribed and refill timely - follow up with your providers as recommended  - Notify your doctor of any new or ongoing symptoms - Follow a low salt, heart healthy diet ( limit processed, high fat, high salt food items from diet) Monitor blood sugars daily. Report blood sugars<70 or greater than 180-200 to provider.       Plan: The care management team will reach out to the patient again over the next 45 days.  Quinn Plowman RN,BSN,CCM RN Care Manager Coordinator [phone]

## 2022-02-21 NOTE — Patient Instructions (Addendum)
Start jardiance 10 mg daily in the am   Hold your glipizide for now   You may notice increased urine volume  We need to watch for symptoms of uti and yeast infection  Let me know if you get vaginal itch, discharge or burning to urinate  Let me know if you feel dizzy or dehydrated   Follow up in 3 months (you already have an appt in November)  I ordered your bone density test   Call the [place] office to schedule when it is convenient   Please call the location of your choice from the menu below to schedule your Mammogram and/or Bone Density appointment.    [facility]                            Phone: [phone] [address], [place] [address]    Service: Bone Density ONLY   *this site does NOT perform mammograms

## 2022-02-21 NOTE — Progress Notes (Unsigned)
   Subjective:    Patient ID: Margaret Vazquez, female    DOB: 1936-03-19, 86 y.o.   MRN: [id number]  HPI Pt presents to discuss her medications   Wt Readings from Last 3 Encounters:  02/21/22 193 lb 9.6 oz (87.8 kg)  02/20/22 193 lb 4 oz (87.7 kg)  11/07/21 192 lb 2 oz (87.1 kg)   29.44 kg/m   She sees cardiology Dr Caryl Comes for h/o cryptogenic stroke and a fib and CAD  Takes eliquis bid and plans to try rosuvastatin 2  He wishes to augment medical therapy and would like to consider SGLT2 medication   BP Readings from Last 3 Encounters:  02/21/22 136/70  02/20/22 120/60  11/07/21 128/76   Losartan hct 50-12.5 mg daily  Imdur 60 mg daily  Propranolol prn inc HR   Lab Results  Component Value Date   CREATININE 0.99 10/31/2021   BUN 19 10/31/2021   NA 142 10/31/2021   K 4.1 10/31/2021   CL 104 10/31/2021   CO2 31 10/31/2021  GFR 52.5   Eating well  Tries to drink water (no sugar drinks)   DM2 Lab Results  Component Value Date   HGBA1C 6.6 (H) 10/31/2021   Had 6.5 yesterday at home (from nurse)  Eye exam 04/2021  Takes glipizide xl 10 mg daily  Metformin xr 500 mg daily    Takes ace Is trying a low dose intermittent statin now  Lab Results  Component Value Date   MICROALBUR 0.8 10/31/2021   MICROALBUR 0.5 05/27/2010    Lab Results  Component Value Date   CHOL 186 10/31/2021   HDL 51.20 10/31/2021   LDLCALC 110 (H) 10/31/2021   LDLDIRECT 119.0 05/01/2021   TRIG 124.0 10/31/2021   CHOLHDL 4 10/31/2021     Also found some carotid ca on xray- at dentist  Is planning carotid US  Has not had one in a long time      Review of Systems     Objective:   Physical Exam        Assessment & Plan:

## 2022-02-23 NOTE — Assessment & Plan Note (Signed)
Referral done for dexa   Disc imp of supplements and exercise for bone health

## 2022-02-23 NOTE — Assessment & Plan Note (Signed)
Lab Results  Component Value Date   HGBA1C 6.6 (H) 10/31/2021   Per pt 6.5 yesterday with home nurse Interested in SGLT2 for heart  Will hold glipizide and take jardiance 10 mg daily  Rev poss side eff in detail  Continue metformin xr 500 mg daily  Follow up planned for 3 months

## 2022-02-23 NOTE — Assessment & Plan Note (Signed)
Sees cardiology for this, a fib with CVA in past  On eliquis  Dr Caryl Comes wants her to start SGLT2 for cardiac fxn  Disc med in detail and reviewed poss side eff incl uti and vaginitis and dehydration  Will follow closely

## 2022-02-23 NOTE — Assessment & Plan Note (Addendum)
bp in fair control at this time  BP Readings from Last 1 Encounters:  02/21/22 136/70   No changes needed Most recent labs reviewed  Disc lifstyle change with low sodium diet and exercise  Plan to consider losartan hct 50-12.5 mg daiily imgur 60 mg daily  Amlodipine 2.5 mg dailyPt will start jardiance and if bp goes down due to inc urine prod we may be able to back off the hctz

## 2022-02-23 NOTE — Assessment & Plan Note (Signed)
Disc goals for lipids and reasons to control them Rev last labs with pt Rev low sat fat diet in detail Planning to start crestor 5 mg twice weekly from cardiology to see if she tolerates it

## 2022-02-24 NOTE — Telephone Encounter (Signed)
Called and spoke with patient she has come in for this appt on 02/21/22, pt has started need medication this morning

## 2022-02-27 IMAGING — MG DIGITAL SCREENING BILAT W/ TOMO W/ CAD
8 series · 8 of 24 positions shown · non-contrast
Comparison: Previous exam(s).

CLINICAL DATA: Screening.

EXAM:
DIGITAL SCREENING BILATERAL MAMMOGRAM WITH TOMO AND CAD

[R CC synth-2D]
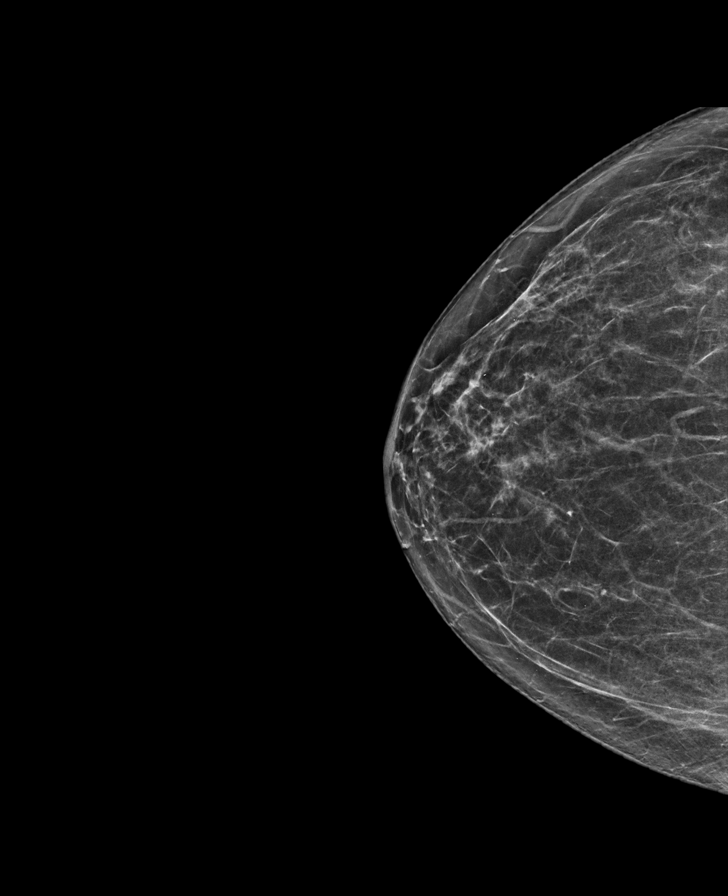

[R MLO synth-2D]
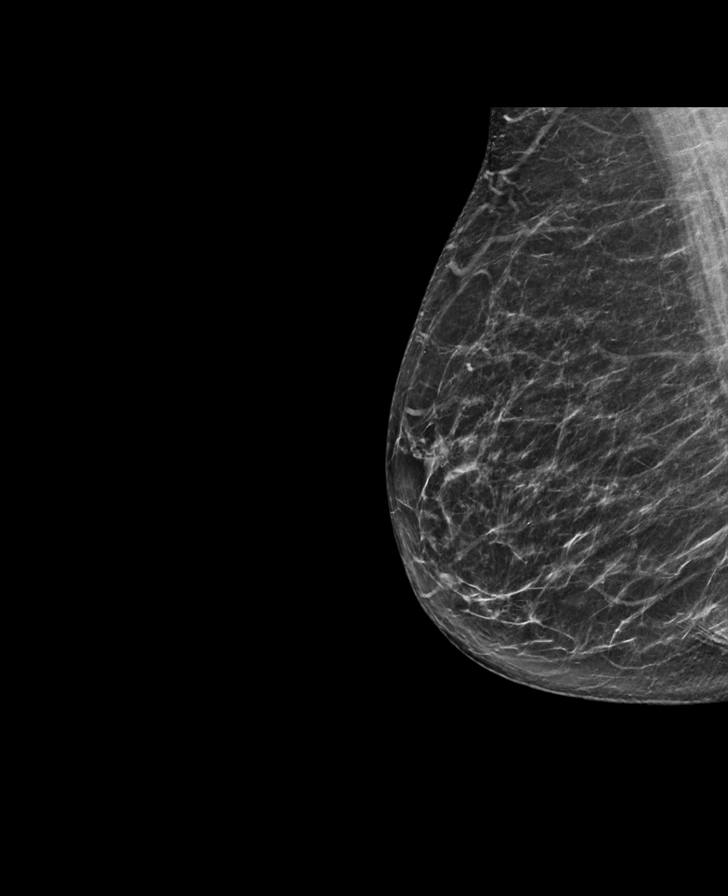

[L MLO synth-2D]
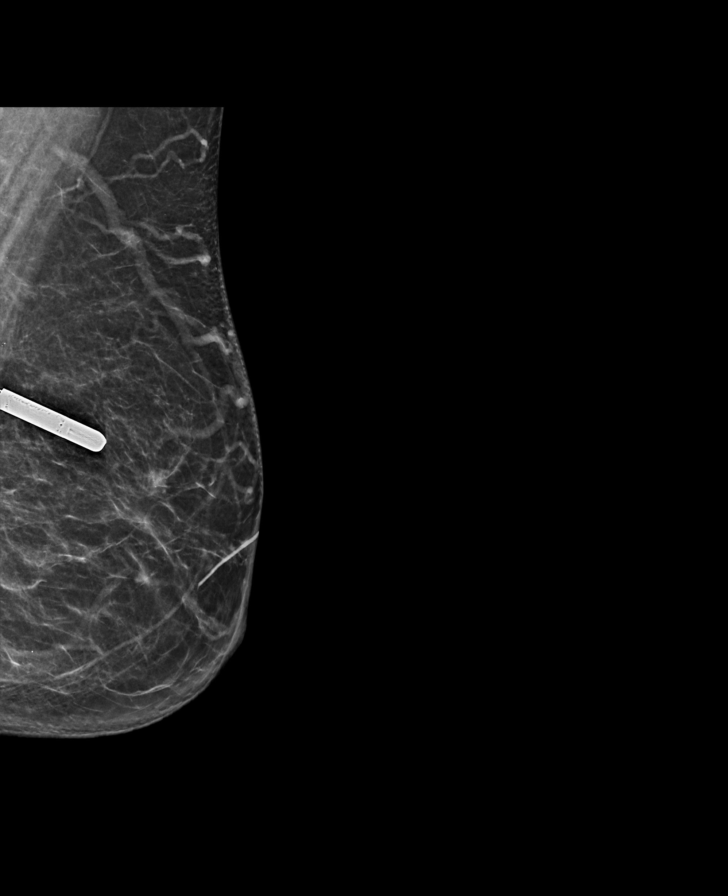

[L CC synth-2D]
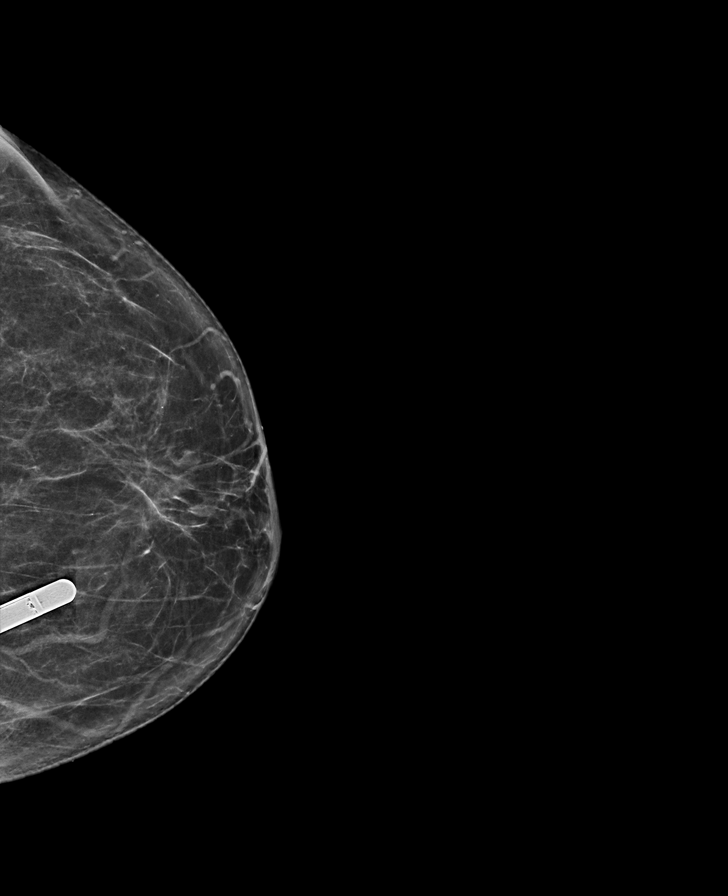

[L CC tomo · tomo slice 32/63.0]
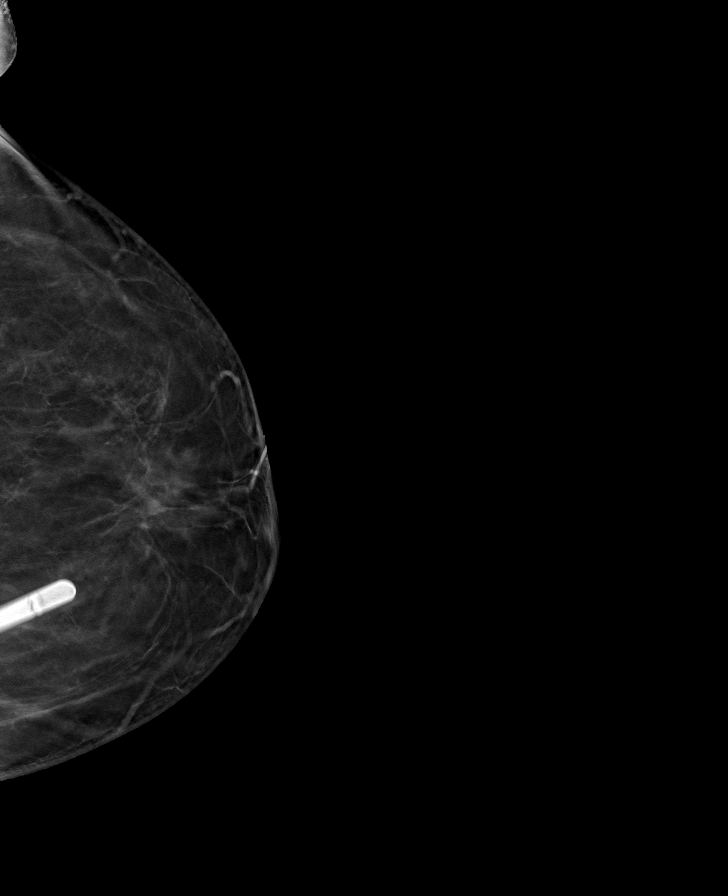

[R MLO tomo · tomo slice 35/69.0]
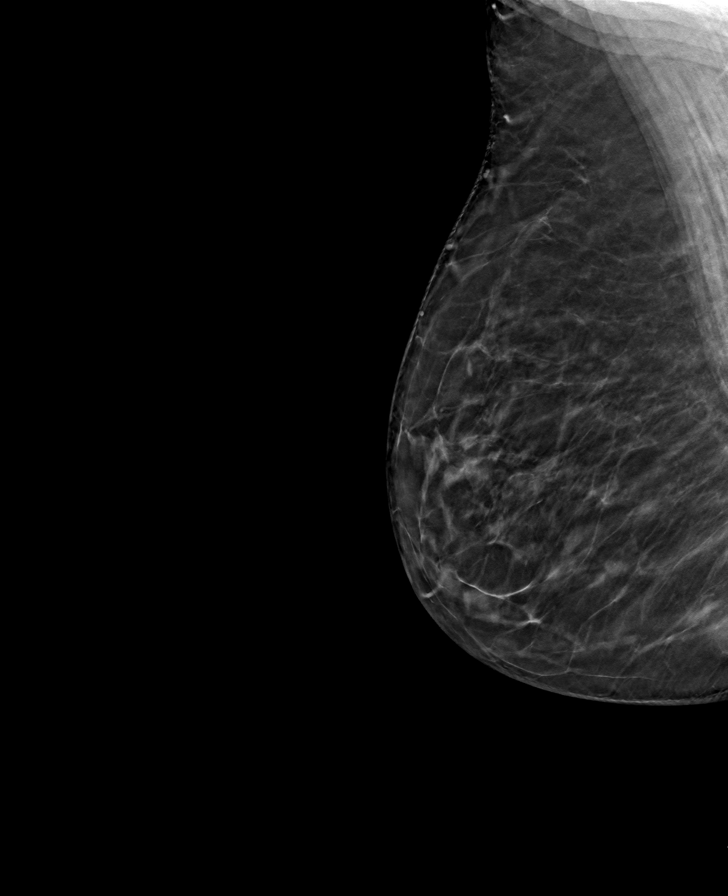

[R CC tomo · tomo slice 32/63.0]
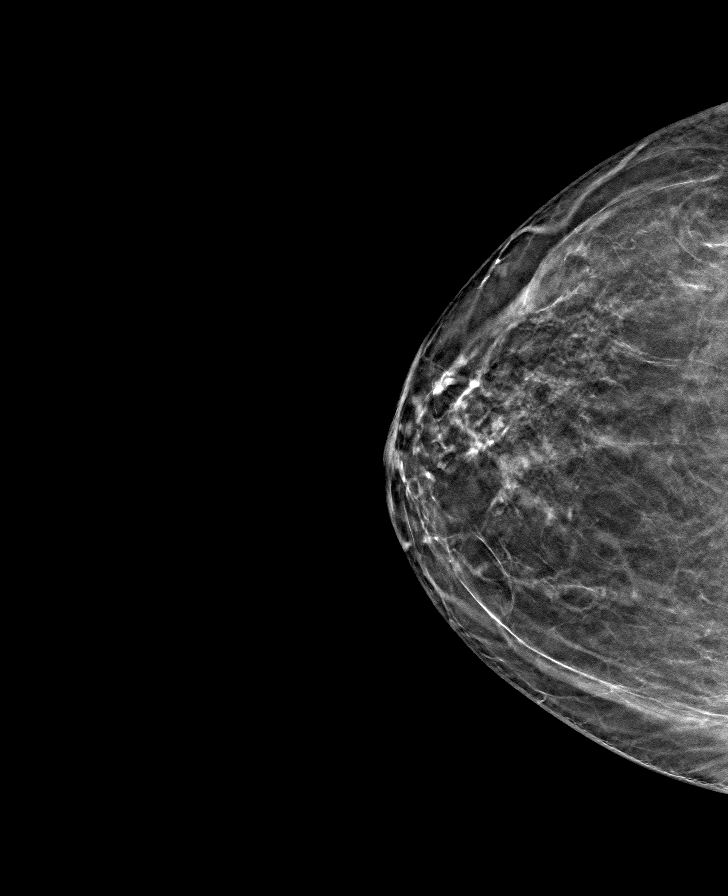

[L MLO tomo · tomo slice 33/66.0]
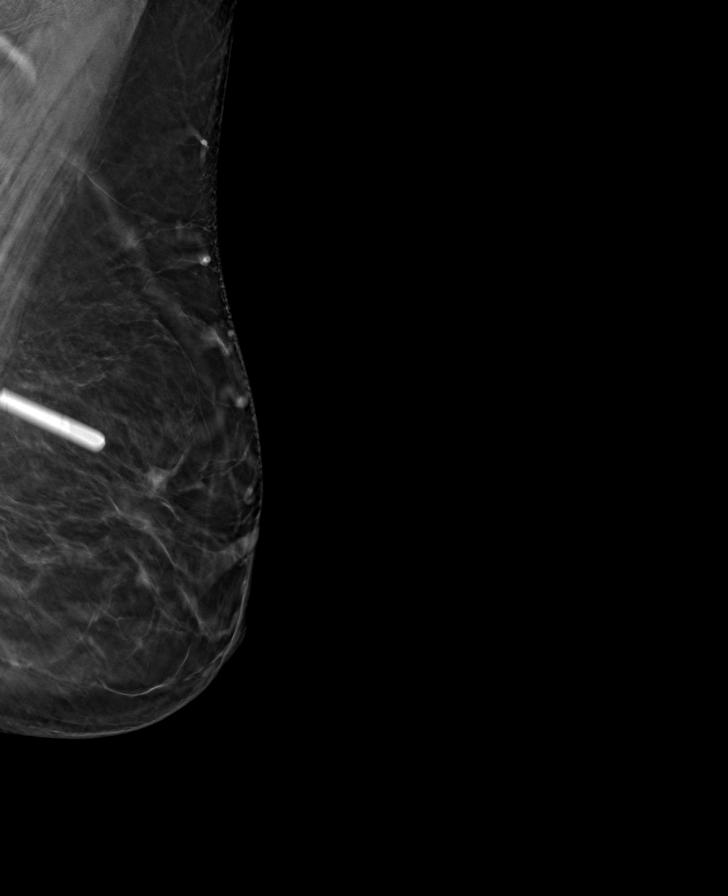

[8 of 24 positions shown; findings below may reference images not displayed]

ACR Breast Density Category b: There are scattered areas of
fibroglandular density.
FINDINGS: There are no findings suspicious for malignancy. Images were
processed with CAD.
IMPRESSION: No mammographic evidence of malignancy. A result letter of this
screening mammogram will be mailed directly to the patient.

RECOMMENDATION:
Screening mammogram in one year. (Code:[id number])

BI-RADS CATEGORY  1: Negative.

## 2022-03-12 ENCOUNTER — Other Ambulatory Visit: Payer: Self-pay | Admitting: Family Medicine

## 2022-03-13 ENCOUNTER — Telehealth: Payer: Self-pay

## 2022-03-13 NOTE — Telephone Encounter (Signed)
Pt called all day pt has had low BP earlier BP 117/53 P 60; most recent BP 103/50 and 111/50 P 60. Pt said has felt "draggy" since starting new pill; pt is not sure if it is the Jardiance or amlodipine. Pt said she was waiting until 4:45 to call because she was hoping the BP would go up but it has not. Pt does not have CP or SOB but pt said "she feels weird in her chest". Pt said since stopping glipizide as instructed pt said FBS has been running high; lately around FBS 182 ; pt said starting on 03/12/22 and 03/13/22 pt restarted glipizide 10 mg on her own. For last 2 days pt taking glipizide 10 mg and Jardiance 10 mg in the AM. 03/13/22 FBS 142. Pt aid DR Caryl Comes started amlodipine 2.5 mg one daily and pt still taking hyzaar 50-12.5 mg taking 1/2 tab daily. Pt said right now pt can "hardly go". Pt has not called DR Caryl Comes; pt said Dr Caryl Comes told pt she might need a pacemaker. Advised pt no available appts at [facility] today and pt said she will try calling Dr Caryl Comes and if cannot see him today will go to [facility] ED. Sending note to Dr Glori Bickers who is out of office and Dr Damita Dunnings who is in office and [place] CMA.

## 2022-03-14 NOTE — Telephone Encounter (Signed)
It sounds like both the jardiance and the amlodipine are new - so either one could cause side effects.  If she wants to hold jardiance to see if she feels better please go ahead and let me know how it goes. Please check in with her and see if she had any advice from cardiology.   Thanks

## 2022-03-14 NOTE — Telephone Encounter (Signed)
Left VM requesting pt to call the office back 

## 2022-03-14 NOTE — Telephone Encounter (Signed)
Pt notified of Dr. Marliss Coots comments. She will hold Jardiance and keep [place] posted. Pt said she started feeling better today so she didn't go to ER or UC. ER precautions given and she will update [place] next week

## 2022-03-14 NOTE — Telephone Encounter (Signed)
Noted, will await input from cardiology.  Routed to PCP as FYI.  Thanks.

## 2022-03-18 ENCOUNTER — Telehealth: Payer: Self-pay | Admitting: Internal Medicine

## 2022-03-18 NOTE — Telephone Encounter (Signed)
Pt c/o BP issue: STAT if pt c/o blurred vision, one-sided weakness or slurred speech  1. What are your last 5 BP readings?  100/51 100/52 9/50 95/49 90/51  Today  113/51 HR 62 106/53   2. Are you having any other symptoms (ex. Dizziness, headache, blurred vision, passed out)? No energy   3. What is your BP issue?  Pt said, her BP is still running low, she feels no  energy sometimes. She is a little concern about her BP

## 2022-03-18 NOTE — Telephone Encounter (Signed)
Per OV note on 8/17:  Continues with exertional shortness of breath and chest discomfort.  CTA is appropriate given her stenting; Myoview was negative.  Think she probably needs a catheterization but as the benefits are for symptoms, we will augment medical therapy and see if this is sufficient.  She is already on isosorbide.  We will add amlodipine 2.5.  Beta-blockers would be a next option.  Attempted to return call to patient to see what time of day these reading are taken, and if it has been low since starting the Amlodipine. No answer. Left detailed message and asked her to return call to office. Will still forward to MD at this time as the Amlodipine which was started for symptoms, will likely need to be adjusted or stopped entirely.  BP log provided to operator: 100/51 100/52 9/50 95/49 90/51   Today  113/51 HR 62 106/53

## 2022-03-19 NOTE — Telephone Encounter (Signed)
As amlodipine was added for antianginal tx and not BP control, I would recommend continuing amlodipine 2.5 mg daily and stopping losartan/HCTZ if her angina has improved since amlodipine was added.  If Margaret Vazquez has not experienced any symptomatic improvement with amlodipine, I would favor stopping amlodipine and continuing the remainder of her medications.  Nelva Bush, MD [facility]

## 2022-03-19 NOTE — Telephone Encounter (Signed)
I spoke with the patient. She was seen by Dr. Caryl Comes on 02/20/22 and advised to add amlodipine 2.5 mg once daily for symptoms of exertional SOB/ chest discomfort. The patient advised that ~ 1 week ago she just didn't feel well--extreme fatigue and started to check her BP.   She was having SBP readings in the 90-low 100's.  She called Dr. Marliss Coots office and was advised to hold Jardiance (she was also having vaginal itching at the time).   She held her amlodipine this morning and BP readings were: 141/53 (66) upon waking 113/51 (66) while on the phone with me.   The patient denies SOB/ chest discomfort at this time.   I have confirmed with her that she is also taking: - Imdur 60 mg once daily in the AM - Losartan-HCTZ 50-12.5: 0.5 tablet (25-6.25 mg) once daily in the AM  The patient is aware that I will forward this message to Dr. Saunders Revel (DOD) as Dr. Caryl Comes is out of the office for the next 2 weeks. She is aware I will call back with further MD recommendations one available. I have advised her to continue to hold her amlodipine & Jardiance until she hears back from [place].   The patient voices understanding and is agreeable.

## 2022-03-19 NOTE — Telephone Encounter (Signed)
I spoke with the patient regarding Dr. Darnelle Bos response/ recommendations as stated below.  Per the patient, she does feel like her anginal symptoms did improve on the amlodipine, but today she was SOB walking around.  I have advised her, starting tomorrow, to: - stop losartan-hctz - restart amlodipine 2.5 mg once daily - stay off jardiance for now as per Dr. Glori Bickers - monitor her BP/ symptoms and call back if no improvement is noted.  The patient voices understanding and is agreeable.  She was appreciative of the call back.

## 2022-03-25 ENCOUNTER — Ambulatory Visit: Payer: HMO | Attending: Internal Medicine

## 2022-03-25 DIAGNOSIS — R0989 Other specified symptoms and signs involving the circulatory and respiratory systems: Secondary | ICD-10-CM | POA: Diagnosis not present

## 2022-03-25 DIAGNOSIS — R200 Anesthesia of skin: Secondary | ICD-10-CM | POA: Diagnosis not present

## 2022-03-26 NOTE — Telephone Encounter (Signed)
C and H Thanks SK

## 2022-03-27 ENCOUNTER — Ambulatory Visit: Payer: Self-pay

## 2022-03-27 NOTE — Patient Outreach (Signed)
  Care Coordination   03/27/2022 Name: Margaret Vazquez MRN: [id number] DOB: 1936/06/07   Care Coordination Outreach Attempts:  An unsuccessful telephone outreach was attempted for a scheduled appointment today. Telephone call to patients home and mobile number.  Unable to reach. HIPAA compliant messages left at both numbers.  Follow Up Plan:  Additional outreach attempts will be made to offer the patient care coordination information and services.   Encounter Outcome:  No Answer  Care Coordination Interventions Activated:  No   Care Coordination Interventions:  No, not indicated    Quinn Plowman RN,BSN,CCM [place] [phone] direct line

## 2022-03-28 ENCOUNTER — Telehealth: Payer: Self-pay

## 2022-03-28 NOTE — Patient Outreach (Signed)
  Care Coordination   Follow Up Visit Note   03/28/2022 Name: NEVAYAH FAUST MRN: [id number] DOB: May 20, 1936  CEASIA ELWELL is a 86 y.o. year old female who sees Tower, [name], MD for primary care. I spoke with  Zandra Abts by phone today.  What matters to the patients health and wellness today?  Patient will be closed to care coordination services due to having [facility] CSNP program and [facility] services. Patient notified.    SDOH assessments and interventions completed:  No     Care Coordination Interventions Activated:  No  Care Coordination Interventions:  No, not indicated   Follow up plan: No further intervention required.   Encounter Outcome:  Pt. Visit Completed   Quinn Plowman RN,BSN,CCM [place] [phone] direct line

## 2022-04-18 ENCOUNTER — Telehealth: Payer: Self-pay | Admitting: Family Medicine

## 2022-04-18 NOTE — Telephone Encounter (Signed)
Patient called in stating that her glucose monitor has died. She stated she changed the batteries in it but it still isn't working. She would like for a new one to be ordered. Thank you!

## 2022-04-19 MED ORDER — BLOOD GLUCOSE METER KIT: PACK | 0 refills | Status: AC

## 2022-04-19 NOTE — Telephone Encounter (Signed)
I sent it to her pharmacy  She may need to let them know what brand depending on what they cover

## 2022-04-19 NOTE — Addendum Note (Signed)
Addended by: Loura Pardon A on: 04/19/2022 11:37 AM   Modules accepted: Orders

## 2022-04-20 ENCOUNTER — Observation Stay
Admission: EM | Admit: 2022-04-20 | Discharge: 2022-04-20 | Disposition: A | Discharge status: Home / Self Care | Payer: HMO | Attending: Hospitalist | Admitting: Hospitalist

## 2022-04-20 ENCOUNTER — Encounter: Payer: Self-pay | Admitting: Emergency Medicine

## 2022-04-20 ENCOUNTER — Other Ambulatory Visit: Payer: Self-pay

## 2022-04-20 ENCOUNTER — Emergency Department: Payer: HMO

## 2022-04-20 DIAGNOSIS — Z85828 Personal history of other malignant neoplasm of skin: Secondary | ICD-10-CM | POA: Diagnosis not present

## 2022-04-20 DIAGNOSIS — E1151 Type 2 diabetes mellitus with diabetic peripheral angiopathy without gangrene: Secondary | ICD-10-CM | POA: Diagnosis not present

## 2022-04-20 DIAGNOSIS — I2510 Atherosclerotic heart disease of native coronary artery without angina pectoris: Secondary | ICD-10-CM | POA: Diagnosis present

## 2022-04-20 DIAGNOSIS — I200 Unstable angina: Secondary | ICD-10-CM

## 2022-04-20 DIAGNOSIS — Z7984 Long term (current) use of oral hypoglycemic drugs: Secondary | ICD-10-CM | POA: Insufficient documentation

## 2022-04-20 DIAGNOSIS — R0789 Other chest pain: Secondary | ICD-10-CM

## 2022-04-20 DIAGNOSIS — I10 Essential (primary) hypertension: Secondary | ICD-10-CM | POA: Diagnosis present

## 2022-04-20 DIAGNOSIS — I25118 Atherosclerotic heart disease of native coronary artery with other forms of angina pectoris: Secondary | ICD-10-CM | POA: Insufficient documentation

## 2022-04-20 DIAGNOSIS — Z79899 Other long term (current) drug therapy: Secondary | ICD-10-CM | POA: Insufficient documentation

## 2022-04-20 DIAGNOSIS — N182 Chronic kidney disease, stage 2 (mild): Secondary | ICD-10-CM | POA: Insufficient documentation

## 2022-04-20 DIAGNOSIS — Z7901 Long term (current) use of anticoagulants: Secondary | ICD-10-CM | POA: Diagnosis not present

## 2022-04-20 DIAGNOSIS — R079 Chest pain, unspecified: Principal | ICD-10-CM | POA: Diagnosis present

## 2022-04-20 DIAGNOSIS — I480 Paroxysmal atrial fibrillation: Secondary | ICD-10-CM | POA: Diagnosis not present

## 2022-04-20 DIAGNOSIS — Z955 Presence of coronary angioplasty implant and graft: Secondary | ICD-10-CM | POA: Diagnosis not present

## 2022-04-20 DIAGNOSIS — E119 Type 2 diabetes mellitus without complications: Secondary | ICD-10-CM

## 2022-04-20 DIAGNOSIS — I129 Hypertensive chronic kidney disease with stage 1 through stage 4 chronic kidney disease, or unspecified chronic kidney disease: Secondary | ICD-10-CM | POA: Diagnosis not present

## 2022-04-20 DIAGNOSIS — Z8673 Personal history of transient ischemic attack (TIA), and cerebral infarction without residual deficits: Secondary | ICD-10-CM | POA: Diagnosis not present

## 2022-04-20 LAB — BASIC METABOLIC PANEL
Anion gap: 6 (ref 5–15)
BUN: 22 mg/dL (ref 8–23)
CO2: 24 mmol/L (ref 22–32)
Calcium: 9 mg/dL (ref 8.9–10.3)
Chloride: 110 mmol/L (ref 98–111)
Creatinine, Ser: 0.98 mg/dL (ref 0.44–1.00)
GFR, Estimated: 57 mL/min — ABNORMAL LOW (ref >60)
Glucose, Bld: 130 mg/dL — ABNORMAL HIGH (ref 70–99)
Potassium: 4.1 mmol/L (ref 3.5–5.1)
Sodium: 140 mmol/L (ref 135–145)

## 2022-04-20 LAB — CBC
HCT: 41.8 % (ref 36.0–46.0)
Hemoglobin: 14 g/dL (ref 12.0–15.0)
MCH: 31.7 pg (ref 26.0–34.0)
MCHC: 33.5 g/dL (ref 30.0–36.0)
MCV: 94.8 fL (ref 80.0–100.0)
Platelets: 179 10*3/uL (ref 150–400)
RBC: 4.41 MIL/uL (ref 3.87–5.11)
RDW: 12.5 % (ref 11.5–15.5)
WBC: 5.3 10*3/uL (ref 4.0–10.5)
nRBC: 0 % (ref 0.0–0.2)

## 2022-04-20 LAB — TROPONIN I (HIGH SENSITIVITY)
Troponin I (High Sensitivity): 5 ng/L (ref <18)
Troponin I (High Sensitivity): 6 ng/L (ref <18)

## 2022-04-20 LAB — CBG MONITORING, ED
Glucose-Capillary: 60 mg/dL — ABNORMAL LOW (ref 70–99)
Glucose-Capillary: 81 mg/dL (ref 70–99)
Glucose-Capillary: 77 mg/dL (ref 70–99)

## 2022-04-20 MED ORDER — NITROGLYCERIN 0.4 MG SL SUBL
0.4000 mg | SUBLINGUAL_TABLET | SUBLINGUAL | Status: DC | PRN
  Administered 2022-04-20 (×2): 0.4 mg via SUBLINGUAL
  Filled 2022-04-20: qty 1

## 2022-04-20 MED ORDER — INSULIN ASPART 100 UNIT/ML IJ SOLN: 0.0000 [IU] | INTRAMUSCULAR | Status: DC

## 2022-04-20 MED ORDER — DEXTROSE 50 % IV SOLN: 1.0000 | INTRAVENOUS | Status: DC | PRN

## 2022-04-20 MED ORDER — ACETAMINOPHEN 325 MG PO TABS: 650.0000 mg | ORAL_TABLET | ORAL | Status: DC | PRN

## 2022-04-20 MED ORDER — AMLODIPINE BESYLATE 5 MG PO TABS: 2.5000 mg | ORAL_TABLET | Freq: Every day | ORAL | Status: DC

## 2022-04-20 MED ORDER — ROSUVASTATIN CALCIUM 5 MG PO TABS
5.0000 mg | ORAL_TABLET | Freq: Every day | ORAL | Status: DC
  Filled 2022-04-20: qty 1

## 2022-04-20 MED ORDER — ONDANSETRON HCL 4 MG/2ML IJ SOLN: 4.0000 mg | Freq: Four times a day (QID) | INTRAMUSCULAR | Status: DC | PRN

## 2022-04-20 MED ORDER — MORPHINE SULFATE (PF) 2 MG/ML IV SOLN
2.0000 mg | Freq: Once | INTRAVENOUS | Status: AC
  Administered 2022-04-20: 2 mg via INTRAVENOUS
  Filled 2022-04-20: qty 1

## 2022-04-20 MED ORDER — ISOSORBIDE MONONITRATE ER 60 MG PO TB24: 60.0000 mg | ORAL_TABLET | Freq: Every day | ORAL | Status: DC

## 2022-04-20 MED ORDER — ONDANSETRON HCL 4 MG/2ML IJ SOLN
4.0000 mg | Freq: Once | INTRAMUSCULAR | Status: AC
  Administered 2022-04-20: 4 mg via INTRAVENOUS
  Filled 2022-04-20: qty 2

## 2022-04-20 MED ORDER — POTASSIUM CHLORIDE CRYS ER 20 MEQ PO TBCR: 20.0000 meq | EXTENDED_RELEASE_TABLET | Freq: Every day | ORAL | Status: DC

## 2022-04-20 MED ORDER — APIXABAN 5 MG PO TABS: 5.0000 mg | ORAL_TABLET | Freq: Two times a day (BID) | ORAL | Status: DC

## 2022-04-20 NOTE — ED Provider Notes (Signed)
[facility] Provider Note    Event Date/Time   First MD Initiated Contact with Patient 04/20/22 0240     (approximate)   History   Chest Pain   HPI  Margaret Vazquez is a 86 y.o. female who presents to the ED from home with a chief complaint of chest pain.  Patient has an extensive CAD history status post stent, CKD, diabetes, TIA, on Eliquis for PAF.  Reports left-sided chest pain radiating to her neck beginning around 9:30 PM.  Took 1 Tylenol and 3 nitroglycerin tablets without relief of symptoms.  Denies associated diaphoresis, shortness of breath, nausea/vomiting or dizziness.  Denies recent fever, cough, abdominal pain or diarrhea.     Past Medical History   Past Medical History:  Diagnosis Date   Allergy history, drug    Aspirin   Basal cell carcinoma    "back"   CAD (coronary artery disease)    a. Previously nonobstructive then progressive angina with abnl CT -> cardiac cath 12/24/17 showed 30% mid RCA and 80% prox-mid Cx. She received DES to mid AV groove Cx. EF 55-65%.    Cervical stenosis of spine    With neck pain   CKD (chronic kidney disease), stage II    Colon polyps 9432   Complication of anesthesia 1980s   slow to wake after anesthesia "when I had breast biopsy"   GERD (gastroesophageal reflux disease)    Hyperlipidemia    myalgias with Lipitor and Zetia   Hypertension    Migraine    "stopped at age 62" (12/24/2017)   Squamous carcinoma    "iced off and cut off; mostly arms" (12/24/2017)   Stroke [facility])    "told me I'd had 2 strokes in 02/2013"; denies residual on 12/24/2017   TIA (transient ischemic attack)    Type II diabetes mellitus [facility])      Active Problem List   Patient Active Problem List   Diagnosis Date Noted   Chest pain 04/20/2022   Type II diabetes mellitus ([place])    Estrogen deficiency 11/07/2021   Grief reaction 05/08/2021   Elevated TSH 11/05/2020   Allergy to statin medication 03/26/2020   History of loop  recorder 09/06/2019   History of seizure 03/23/2019   Left-sided epistaxis 11/17/2018   Medicare annual wellness visit, subsequent 09/13/2018   Screening mammogram, encounter for 09/13/2018   Herpes zoster 08/10/2018   Coronary artery disease 12/24/2017   Routine general medical examination at a health care facility 08/24/2016   PAF (paroxysmal atrial fibrillation) ([place]) 04/07/2016   Overweight (BMI 25.0-29.9) 08/23/2014   Type 2 diabetes mellitus with diabetic peripheral angiopathy without gangrene, without long-term current use of insulin ([place]) 01/12/2014   TIA (transient ischemic attack) 01/11/2014   History of CVA (cerebrovascular accident) 01/11/2014   LOOP Recorder LINQ 06/07/2013   Sinusitis, chronic 05/16/2013   H/O: CVA (cerebrovascular accident) 02/15/2013   Encounter for Medicare annual wellness exam 10/27/2012   Encopresis(307.7) 12/15/2011   Post-menopausal 10/28/2011   PERSONAL HX COLONIC POLYPS 09/04/2009   B12 deficiency 03/06/2009   ALLERGIC RHINITIS 02/14/2008   BACK PAIN, LUMBAR 11/03/2007   Hyperlipidemia associated with type 2 diabetes mellitus ([place]) 11/24/2006   Essential hypertension 11/24/2006   GERD 10/09/2006   OVERACTIVE BLADDER 10/09/2006   INCONTINENCE, URGE 10/09/2006   SKIN CANCER, HX OF 10/09/2006     Past Surgical History   Past Surgical History:  Procedure Laterality Date   ABD [place]  07/2003   Negative  APPENDECTOMY     BASAL CELL CARCINOMA EXCISION     "back"   BREAST BIOPSY Left 1992   benign   CARDIAC CATHETERIZATION  04/07/2011   non obst CAD (Dr Burt Knack)   CATARACT EXTRACTION W/PHACO Left 04/01/2017   Procedure: CATARACT EXTRACTION PHACO AND INTRAOCULAR LENS PLACEMENT ([place]) LEFT DIABETIC;  Surgeon: Leandrew Koyanagi, MD;  Location: [place];  Service: Ophthalmology;  Laterality: Left;  Diabetic - oral meds   CATARACT EXTRACTION W/PHACO Right 04/29/2017   Procedure: CATARACT EXTRACTION PHACO AND INTRAOCULAR LENS PLACEMENT  ([place]) RIGHT DIABETIC;  Surgeon: Leandrew Koyanagi, MD;  Location: [place];  Service: Ophthalmology;  Laterality: Right;  Diabetic - oral meds   COLONOSCOPY  12/2007   Adenomatous colon polyps   CORONARY ANGIOPLASTY WITH STENT PLACEMENT  12/24/2017   CORONARY STENT INTERVENTION N/A 12/24/2017   Procedure: CORONARY STENT INTERVENTION;  Surgeon: Lorretta Harp, MD;  Location: [place] CV LAB;  Service: Cardiovascular;  Laterality: N/A;   CYSTOSCOPY W/ DECANNULATION  03/2000   Normal   LEFT HEART CATH AND CORONARY ANGIOGRAPHY N/A 12/24/2017   Procedure: LEFT HEART CATH AND CORONARY ANGIOGRAPHY;  Surgeon: Lorretta Harp, MD;  Location: [place] CV LAB;  Service: Cardiovascular;  Laterality: N/A;   LOOP RECORDER IMPLANT N/A 02/16/2013   Procedure: LOOP RECORDER IMPLANT;  Surgeon: Deboraha Sprang, MD;  Location: [facility] CATH LAB;  Service: Cardiovascular;  Laterality: N/A;   MOHS SURGERY     right hand    NASAL SINUS SURGERY  01/2005   SQUAMOUS CELL CARCINOMA EXCISION     "mostly arms;" (12/24/2017)   STRESS CARDIOLITE  11/1999   Normal/ negative   TEAR DUCT PROBING  2005   "? side"   TEE WITHOUT CARDIOVERSION N/A 02/16/2013   Procedure: TRANSESOPHAGEAL ECHOCARDIOGRAM (TEE);  Surgeon: Josue Hector, MD;  Location: [facility] ENDOSCOPY;  Service: Cardiovascular;  Laterality: N/A;   TUBAL LIGATION     BTL     Home Medications   Prior to Admission medications   Medication Sig Start Date End Date Taking? Authorizing Provider  amLODipine (NORVASC) 2.5 MG tablet Take 1 tablet (2.5 mg) by mouth once daily 02/20/22   Deboraha Sprang, MD  apixaban (ELIQUIS) 5 MG TABS tablet Take 1 tablet (5 mg total) by mouth 2 (two) times daily. 11/25/21   Deboraha Sprang, MD  blood glucose meter kit and supplies Dispense based on patient and insurance preference. Use up to four times daily as directed. (FOR ICD-10 E10.9, E11.9). 04/19/22   Tower, [name], MD  Blood Glucose Monitoring Suppl (ONE TOUCH  ULTRA 2) w/Device KIT Use to check blood sugar once daily 10/25/18   Tower, [place] A, MD  Cyanocobalamin (VITAMIN B-12 PO) Take 1 tablet by mouth daily.    [provider]  diphenhydrAMINE (BENADRYL) 25 MG tablet Take 50 mg by mouth every 8 (eight) hours as needed for allergies.     [provider]  EPINEPHrine (EPI-PEN) 0.3 mg/0.3 mL DEVI Inject 0.3 mg into the muscle daily as needed (allergic reaction). 12/20/10   Tower, [name], MD  fluticasone (FLONASE) 50 MCG/ACT nasal spray Place 2 sprays into both nostrils 2 (two) times daily as needed. Reported on 09/10/2015 01/20/14   [provider]  glipiZIDE (GLUCOTROL XL) 10 MG 24 hr tablet TAKE 1 TABLET(10 MG) BY MOUTH DAILY WITH BREAKFAST Patient not taking: Reported on 02/21/2022 11/07/21   Tower, [name], MD  glucose blood (ONETOUCH ULTRA) test strip  USE TO CHECK BLOOD SUGAR ONCE DAILY AS DIRECTED 12/12/21   Tower, [name], MD  isosorbide mononitrate (IMDUR) 60 MG 24 hr tablet TAKE 1 TABLET(60 MG) BY MOUTH DAILY 07/19/21   Deboraha Sprang, MD  metFORMIN (GLUCOPHAGE-XR) 500 MG 24 hr tablet TAKE 1 TABLET(500 MG) BY MOUTH DAILY WITH BREAKFAST 11/07/21   Tower, [name], MD  nitroGLYCERIN (NITROSTAT) 0.4 MG SL tablet Place 1 tablet (0.4 mg total) under the tongue every 5 (five) minutes as needed for chest pain (up to 3 doses. If taking 3rd dose call 911). 12/25/17   Dunn, Nedra Hai, PA-C  potassium chloride SA (KLOR-CON M) 20 MEQ tablet Take 1 tablet (20 mEq total) by mouth daily. 11/07/21   Tower, [name], MD  propranolol (INDERAL) 10 MG tablet Take 1 tablet (10 mg) by mouth every 1 hour x 3 doses as needed for chest pain/ fast heart rates 05/03/20   Deboraha Sprang, MD  rosuvastatin (CRESTOR) 5 MG tablet Take 1 tablet (5 mg) by mouth twice a week 02/20/22   Deboraha Sprang, MD     Allergies  Bee venom, Nabumetone, Amoxicillin-pot clavulanate, Aspirin, Atorvastatin, Clopidogrel bisulfate, Codeine, Ezetimibe, Metformin and related, Valsartan, and  Other   Family History   Family History  Problem Relation Age of Onset   Lung cancer Brother    Diabetes Brother    Pancreatic cancer Brother    Heart disease Mother    Heart disease Father    Brain cancer Other    Skin cancer Daughter    Diabetes Sister    Breast cancer Sister    Colon cancer Neg Hx      Physical Exam  Triage Vital Signs: ED Triage Vitals  Enc Vitals Group     BP 04/20/22 0140 (!) 162/74     Pulse Rate 04/20/22 0140 (!) 53     Resp 04/20/22 0140 16     Temp 04/20/22 0140 98.1 F (36.7 C)     Temp Source 04/20/22 0140 Oral     SpO2 04/20/22 0140 95 %     Weight 04/20/22 0141 198 lb (89.8 kg)     Height 04/20/22 0141 5' 9"  (1.753 m)     Head Circumference --      Peak Flow --      Pain Score 04/20/22 0141 6     Pain Loc --      Pain Edu? --      Excl. in [place]? --     Updated Vital Signs: BP (!) 151/55   Pulse (!) 50   Temp 98.1 F (36.7 C) (Oral)   Resp 16   Ht 5' 9"  (1.753 m)   Wt 89.8 kg   SpO2 94%   BMI 29.24 kg/m    General: Awake, mild distress.  CV:  Mildly bradycardic.  Good peripheral perfusion.  Resp:  Normal effort.  Mildly diminished, otherwise CTA B. Abd:  Nontender.  Vazquez distention.  Other:  Vazquez truncal vesicles.   ED Results / Procedures / Treatments  Labs (all labs ordered are listed, but only abnormal results are displayed) Labs Reviewed  BASIC METABOLIC PANEL - Abnormal; Notable for the following components:      Result Value   Glucose, Bld 130 (*)    GFR, Estimated 57 (*)    All other components within normal limits  CBC  LIPOPROTEIN A (LPA)  TROPONIN I (HIGH SENSITIVITY)  TROPONIN I (HIGH SENSITIVITY)     EKG  ED ECG REPORT I, Armone[place] Bojanowski J, the attending physician, personally viewed and interpreted this ECG.   Date: 04/20/2022  EKG Time: 0133  Rate: 59  Rhythm: sinus bradycardia  Axis: Normal  Intervals:none  ST&T Change: Nonspecific    RADIOLOGY I have independently visualized and interpreted  patient's chest x-ray as well as noted the radiology interpretation:  Chest x-ray: Vazquez acute cardiopulmonary process  Official radiology report(s): DG Chest 2 View  Result Date: 04/20/2022 CLINICAL DATA:  Chest pain EXAM: CHEST - 2 VIEW COMPARISON:  03/21/2020 FINDINGS: Heart and mediastinal contours are within normal limits. Vazquez focal opacities or effusions. Vazquez acute bony abnormality. IMPRESSION: Vazquez active cardiopulmonary disease. Electronically Signed   By: Rolm Baptise M.D.   On: 04/20/2022 02:10     PROCEDURES:  Critical Care performed: Yes, see critical care procedure note(s)  CRITICAL CARE Performed by: Paulette Blanch   Total critical care time: 30 minutes  Critical care time was exclusive of separately billable procedures and treating other patients.  Critical care was necessary to treat or prevent imminent or life-threatening deterioration.  Critical care was time spent personally by me on the following activities: development of treatment plan with patient and/or surrogate as well as nursing, discussions with consultants, evaluation of patient's response to treatment, examination of patient, obtaining history from patient or surrogate, ordering and performing treatments and interventions, ordering and review of laboratory studies, ordering and review of radiographic studies, pulse oximetry and re-evaluation of patient's condition.   Marland Kitchen1-3 Lead EKG Interpretation  Performed by: Paulette Blanch, MD Authorized by: Paulette Blanch, MD     Interpretation: abnormal     ECG rate:  55   ECG rate assessment: bradycardic     Rhythm: sinus bradycardia     Ectopy: none     Conduction: normal   Comments:     Patient placed on cardiac monitor to evaluate for arrhythmias    MEDICATIONS ORDERED IN ED: Medications  amLODipine (NORVASC) tablet 2.5 mg (has Vazquez administration in time range)  isosorbide mononitrate (IMDUR) 24 hr tablet 60 mg (has Vazquez administration in time range)  rosuvastatin  (CRESTOR) tablet 5 mg (has Vazquez administration in time range)  apixaban (ELIQUIS) tablet 5 mg (has Vazquez administration in time range)  potassium chloride SA (KLOR-CON M) CR tablet 20 mEq (has Vazquez administration in time range)  nitroGLYCERIN (NITROSTAT) SL tablet 0.4 mg (has Vazquez administration in time range)  acetaminophen (TYLENOL) tablet 650 mg (has Vazquez administration in time range)  ondansetron (ZOFRAN) injection 4 mg (has Vazquez administration in time range)  insulin aspart (novoLOG) injection 0-15 Units (has Vazquez administration in time range)  morphine (PF) 2 MG/ML injection 2 mg (2 mg Intravenous Given 04/20/22 0319)  ondansetron (ZOFRAN) injection 4 mg (4 mg Intravenous Given 04/20/22 0319)     IMPRESSION / MDM / ASSESSMENT AND PLAN / ED COURSE  I reviewed the triage vital signs and the nursing notes.                             86 year old female with extensive cardiac history presenting with chest pain. Differential diagnosis includes, but is not limited to, ACS, aortic dissection, pulmonary embolism, cardiac tamponade, pneumothorax, pneumonia, pericarditis, myocarditis, GI-related causes including esophagitis/gastritis, and musculoskeletal chest wall pain.   I have personally reviewed patient's records and note she had a PCP office visit on 02/21/2022.  Patient's presentation is most consistent with acute presentation with potential  threat to life or bodily function.  The patient is on the cardiac monitor to evaluate for evidence of arrhythmia and/or significant heart rate changes.  Initial laboratory results demonstrate normal WBC 5.3, normal electrolytes, initial troponin and chest x-ray unremarkable.  Room air saturation 92%; placed on 2 L nasal cannula oxygen.  Given that patient had Vazquez relief of symptoms with nitroglycerin taken at home, will administer low-dose Morphine paired with Zofran.  Will consult hospital services for evaluation and admission.      FINAL CLINICAL IMPRESSION(S) / ED  DIAGNOSES   Final diagnoses:  Unstable angina ([place])  Other chest pain     Rx / DC Orders   ED Discharge Orders     None        Note:  This document was prepared using Dragon voice recognition software and may include unintentional dictation errors.   Paulette Blanch, MD 04/20/22 [phone]

## 2022-04-20 NOTE — ED Notes (Signed)
BGL 60, given 2 cups of orange juice. Continuing to monitor

## 2022-04-20 NOTE — Assessment & Plan Note (Addendum)
CAD s/p stent 2019 cardiac cath 12/24/17 showed 30% mid RCA and 80% prox-mid Cx. She received DES to mid AV groove Cx. EF 55-65% First troponin 5, EKG nonacute We will continue to trend troponins Nitroglycerin sublingual as needed chest pain with morphine for breakthrough Continue propranolol, rosuvastatin and Imdur Patient is allergic to aspirin and Plavix.  Was placed on Brilinta monotherapy after her cath for a year Cardiology consult

## 2022-04-20 NOTE — Assessment & Plan Note (Signed)
Sliding scale insulin coverage Hold metformin 

## 2022-04-20 NOTE — H&P (Signed)
History and Physical    Patient: Margaret Vazquez NWG:[id number] DOB: [birth year] DOA: 04/20/2022 DOS: the patient was seen and examined on 04/20/2022 PCP: Abner Greenspan, MD  Patient coming from: Home  Chief Complaint:  Chief Complaint  Patient presents with   Chest Pain    HPI: Margaret Vazquez is a 86 y.o. female with medical history significant for DM 2, HTN, prior CVA, paroxysmal A-fib on apixaban, CAD s/p PCI 2019, allergic to aspirin and Plavix, who presents to the ED with chest pain radiating to the left neck, unrelieved after 3 sublingual nitroglycerin tablets.  She denied nausea, vomiting, diaphoresis, palpitations or lightheadedness.  She has no cough, fever or chills. ED course and data review: BP 162/74 with pulse 53 and otherwise normal vitals.  First troponin 5.  Other labs unremarkable.   EKG, personally viewed and interpreted showing sinus bradycardia at 59 with PVCs chest x-ray nonacute Patient given a dose of morphine.  Hospitalist consulted for admission.   Review of Systems: As mentioned in the history of present illness. All other systems reviewed and are negative.  Past Medical History:  Diagnosis Date   Allergy history, drug    Aspirin   Basal cell carcinoma    "back"   CAD (coronary artery disease)    a. Previously nonobstructive then progressive angina with abnl CT -> cardiac cath 12/24/17 showed 30% mid RCA and 80% prox-mid Cx. She received DES to mid AV groove Cx. EF 55-65%.    Cervical stenosis of spine    With neck pain   CKD (chronic kidney disease), stage II    Colon polyps 5784   Complication of anesthesia 1980s   slow to wake after anesthesia "when I had breast biopsy"   GERD (gastroesophageal reflux disease)    Hyperlipidemia    myalgias with Lipitor and Zetia   Hypertension    Migraine    "stopped at age 69" (12/24/2017)   Squamous carcinoma    "iced off and cut off; mostly arms" (12/24/2017)   Stroke [facility])    "told me I'd had 2 strokes in  02/2013"; denies residual on 12/24/2017   TIA (transient ischemic attack)    Type II diabetes mellitus ([place])    Past Surgical History:  Procedure Laterality Date   ABD [place]  07/2003   Negative   APPENDECTOMY     BASAL CELL CARCINOMA EXCISION     "back"   BREAST BIOPSY Left 1992   benign   CARDIAC CATHETERIZATION  04/07/2011   non obst CAD (Dr Burt Knack)   CATARACT EXTRACTION W/PHACO Left 04/01/2017   Procedure: CATARACT EXTRACTION PHACO AND INTRAOCULAR LENS PLACEMENT ([place]) LEFT DIABETIC;  Surgeon: Leandrew Koyanagi, MD;  Location: [place];  Service: Ophthalmology;  Laterality: Left;  Diabetic - oral meds   CATARACT EXTRACTION W/PHACO Right 04/29/2017   Procedure: CATARACT EXTRACTION PHACO AND INTRAOCULAR LENS PLACEMENT ([place]) RIGHT DIABETIC;  Surgeon: Leandrew Koyanagi, MD;  Location: [place];  Service: Ophthalmology;  Laterality: Right;  Diabetic - oral meds   COLONOSCOPY  12/2007   Adenomatous colon polyps   CORONARY ANGIOPLASTY WITH STENT PLACEMENT  12/24/2017   CORONARY STENT INTERVENTION N/A 12/24/2017   Procedure: CORONARY STENT INTERVENTION;  Surgeon: Lorretta Harp, MD;  Location: [place] CV LAB;  Service: Cardiovascular;  Laterality: N/A;   CYSTOSCOPY W/ DECANNULATION  03/2000   Normal   LEFT HEART CATH AND CORONARY ANGIOGRAPHY N/A 12/24/2017   Procedure: LEFT HEART CATH AND CORONARY ANGIOGRAPHY;  Surgeon: Lorretta Harp, MD;  Location: [place] CV LAB;  Service: Cardiovascular;  Laterality: N/A;   LOOP RECORDER IMPLANT N/A 02/16/2013   Procedure: LOOP RECORDER IMPLANT;  Surgeon: Deboraha Sprang, MD;  Location: [facility] CATH LAB;  Service: Cardiovascular;  Laterality: N/A;   MOHS SURGERY     right hand    NASAL SINUS SURGERY  01/2005   SQUAMOUS CELL CARCINOMA EXCISION     "mostly arms;" (12/24/2017)   STRESS CARDIOLITE  11/1999   Normal/ negative   TEAR DUCT PROBING  2005   "? side"   TEE WITHOUT CARDIOVERSION N/A 02/16/2013   Procedure:  TRANSESOPHAGEAL ECHOCARDIOGRAM (TEE);  Surgeon: Josue Hector, MD;  Location: [facility] ENDOSCOPY;  Service: Cardiovascular;  Laterality: N/A;   TUBAL LIGATION     BTL   Social History:  reports that she has never smoked. She has never used smokeless tobacco. She reports that she does not drink alcohol and does not use drugs.  Allergies  Allergen Reactions   Bee Venom Hives, Shortness Of Breath and Swelling   Nabumetone Anaphylaxis   Amoxicillin-Pot Clavulanate Hives and Swelling    To lips.   Aspirin Hives   Atorvastatin Swelling     joint pain/swelling, inc liver tests   Clopidogrel Bisulfate Hives   Codeine Nausea And Vomiting   Ezetimibe Other (See Comments)     fatigue   Metformin And Related Other (See Comments)    Diarrhea    Valsartan Other (See Comments)     fatigue   Other Hives    **Red Meat**  SOB    Family History  Problem Relation Age of Onset   Lung cancer Brother    Diabetes Brother    Pancreatic cancer Brother    Heart disease Mother    Heart disease Father    Brain cancer Other    Skin cancer Daughter    Diabetes Sister    Breast cancer Sister    Colon cancer Neg Hx     Prior to Admission medications   Medication Sig Start Date End Date Taking? Authorizing Provider  amLODipine (NORVASC) 2.5 MG tablet Take 1 tablet (2.5 mg) by mouth once daily 02/20/22   Deboraha Sprang, MD  apixaban (ELIQUIS) 5 MG TABS tablet Take 1 tablet (5 mg total) by mouth 2 (two) times daily. 11/25/21   Deboraha Sprang, MD  blood glucose meter kit and supplies Dispense based on patient and insurance preference. Use up to four times daily as directed. (FOR ICD-10 E10.9, E11.9). 04/19/22   Tower, [name], MD  Blood Glucose Monitoring Suppl (ONE TOUCH ULTRA 2) w/Device KIT Use to check blood sugar once daily 10/25/18   Tower, [place] A, MD  Cyanocobalamin (VITAMIN B-12 PO) Take 1 tablet by mouth daily.    [provider]  diphenhydrAMINE (BENADRYL) 25 MG tablet Take 50 mg by mouth  every 8 (eight) hours as needed for allergies.     [provider]  EPINEPHrine (EPI-PEN) 0.3 mg/0.3 mL DEVI Inject 0.3 mg into the muscle daily as needed (allergic reaction). 12/20/10   Tower, [name], MD  fluticasone (FLONASE) 50 MCG/ACT nasal spray Place 2 sprays into both nostrils 2 (two) times daily as needed. Reported on 09/10/2015 01/20/14   [provider]  glipiZIDE (GLUCOTROL XL) 10 MG 24 hr tablet TAKE 1 TABLET(10 MG) BY MOUTH DAILY WITH BREAKFAST Patient not taking: Reported on 02/21/2022 11/07/21   Tower, [name], MD  glucose blood (ONETOUCH ULTRA)  test strip USE TO CHECK BLOOD SUGAR ONCE DAILY AS DIRECTED 12/12/21   Tower, [name], MD  isosorbide mononitrate (IMDUR) 60 MG 24 hr tablet TAKE 1 TABLET(60 MG) BY MOUTH DAILY 07/19/21   Deboraha Sprang, MD  metFORMIN (GLUCOPHAGE-XR) 500 MG 24 hr tablet TAKE 1 TABLET(500 MG) BY MOUTH DAILY WITH BREAKFAST 11/07/21   Tower, [name], MD  nitroGLYCERIN (NITROSTAT) 0.4 MG SL tablet Place 1 tablet (0.4 mg total) under the tongue every 5 (five) minutes as needed for chest pain (up to 3 doses. If taking 3rd dose call 911). 12/25/17   Dunn, Nedra Hai, PA-C  potassium chloride SA (KLOR-CON M) 20 MEQ tablet Take 1 tablet (20 mEq total) by mouth daily. 11/07/21   Tower, [name], MD  propranolol (INDERAL) 10 MG tablet Take 1 tablet (10 mg) by mouth every 1 hour x 3 doses as needed for chest pain/ fast heart rates 05/03/20   Deboraha Sprang, MD  rosuvastatin (CRESTOR) 5 MG tablet Take 1 tablet (5 mg) by mouth twice a week 02/20/22   Deboraha Sprang, MD    Physical Exam: Vitals:   04/20/22 0140 04/20/22 0141 04/20/22 0230  BP: (!) 162/74  (!) 151/55  Pulse: (!) 53  (!) 50  Resp: 16  16  Temp: 98.1 F (36.7 C)    TempSrc: Oral    SpO2: 95%  94%  Weight:  89.8 kg   Height:  5' 9"  (1.753 m)    Physical Exam Vitals and nursing note reviewed.  Constitutional:      General: She is not in acute distress. HENT:     Head: Normocephalic and atraumatic.   Cardiovascular:     Rate and Rhythm: Normal rate and regular rhythm.     Heart sounds: Normal heart sounds.  Pulmonary:     Effort: Pulmonary effort is normal.     Breath sounds: Normal breath sounds.  Abdominal:     Palpations: Abdomen is soft.     Tenderness: There is no abdominal tenderness.  Neurological:     Mental Status: Mental status is at baseline.     Labs on Admission: I have personally reviewed following labs and imaging studies  CBC: Recent Labs  Lab 04/20/22 0154  WBC 5.3  HGB 14.0  HCT 41.8  MCV 94.8  PLT 403   Basic Metabolic Panel: Recent Labs  Lab 04/20/22 0154  NA 140  K 4.1  CL 110  CO2 24  GLUCOSE 130*  BUN 22  CREATININE 0.98  CALCIUM 9.0   GFR: Estimated Creatinine Clearance: 51 mL/min (by C-G formula based on SCr of 0.98 mg/dL). Liver Function Tests: No results for input(s): "AST", "ALT", "ALKPHOS", "BILITOT", "PROT", "ALBUMIN" in the last 168 hours. No results for input(s): "LIPASE", "AMYLASE" in the last 168 hours. No results for input(s): "AMMONIA" in the last 168 hours. Coagulation Profile: No results for input(s): "INR", "PROTIME" in the last 168 hours. Cardiac Enzymes: No results for input(s): "CKTOTAL", "CKMB", "CKMBINDEX", "TROPONINI" in the last 168 hours. BNP (last 3 results) No results for input(s): "PROBNP" in the last 8760 hours. HbA1C: No results for input(s): "HGBA1C" in the last 72 hours. CBG: No results for input(s): "GLUCAP" in the last 168 hours. Lipid Profile: No results for input(s): "CHOL", "HDL", "LDLCALC", "TRIG", "CHOLHDL", "LDLDIRECT" in the last 72 hours. Thyroid Function Tests: No results for input(s): "TSH", "T4TOTAL", "FREET4", "T3FREE", "THYROIDAB" in the last 72 hours. Anemia Panel: No results for input(s): "VITAMINB12", "FOLATE", "FERRITIN", "  TIBC", "IRON", "RETICCTPCT" in the last 72 hours. Urine analysis:    Component Value Date/Time   COLORURINE YELLOW (A) 07/05/2016 1429   APPEARANCEUR  CLEAR (A) 07/05/2016 1429   APPEARANCEUR Clear 08/29/2011 2349   LABSPEC 1.025 07/05/2016 1429   LABSPEC 1.010 08/29/2011 2349   PHURINE 5.0 07/05/2016 1429   GLUCOSEU NEGATIVE 07/05/2016 1429   GLUCOSEU Negative 08/29/2011 2349   HGBUR SMALL (A) 07/05/2016 1429   HGBUR moderate 04/02/2010 1208   BILIRUBINUR neg 11/23/2019 1032   BILIRUBINUR Negative 08/29/2011 2349   KETONESUR NEGATIVE 07/05/2016 1429   PROTEINUR Negative 11/23/2019 1032   PROTEINUR 30 (A) 07/05/2016 1429   UROBILINOGEN 0.2 11/23/2019 1032   UROBILINOGEN 1.0 06/23/2014 1906   NITRITE neg 11/23/2019 1032   NITRITE NEGATIVE 07/05/2016 1429   LEUKOCYTESUR Small (1+) (A) 11/23/2019 1032   LEUKOCYTESUR Trace 08/29/2011 2349    Radiological Exams on Admission: DG Chest 2 View  Result Date: 04/20/2022 CLINICAL DATA:  Chest pain EXAM: CHEST - 2 VIEW COMPARISON:  03/21/2020 FINDINGS: Heart and mediastinal contours are within normal limits. No focal opacities or effusions. No acute bony abnormality. IMPRESSION: No active cardiopulmonary disease. Electronically Signed   By: Rolm Baptise M.D.   On: 04/20/2022 02:10     Data Reviewed: Relevant notes from primary care and specialist visits, past discharge summaries as available in EHR, including Care Everywhere. Prior diagnostic testing as pertinent to current admission diagnoses Updated medications and problem lists for reconciliation ED course, including vitals, labs, imaging, treatment and response to treatment Triage notes, nursing and pharmacy notes and ED provider's notes Notable results as noted in HPI   Assessment and Plan: * Chest pain CAD s/p stent 2019 cardiac cath 12/24/17 showed 30% mid RCA and 80% prox-mid Cx. She received DES to mid AV groove Cx. EF 55-65% First troponin 5, EKG nonacute We will continue to trend troponins Nitroglycerin sublingual as needed chest pain with morphine for breakthrough Continue propranolol, rosuvastatin and Imdur Patient is  allergic to aspirin and Plavix.  Was placed on Brilinta monotherapy after her cath for a year Cardiology consult   PAF (paroxysmal atrial fibrillation) (HCC) Continue apixaban and propranolol  Type 2 diabetes mellitus with diabetic peripheral angiopathy without gangrene, without long-term current use of insulin (HCC) Sliding scale insulin coverage.  Hold metformin  History of CVA (cerebrovascular accident) Continue rosuvastatin.  Intolerant of aspirin and Plavix  Essential hypertension Continue propranolol and amlodipine        DVT prophylaxis: Apixaban  Consults: Cardiology, Dr. Ivin Poot  Advance Care Planning:   Code Status: Prior   Family Communication: none  Disposition Plan: Back to previous home environment  Severity of Illness: The appropriate patient status for this patient is OBSERVATION. Observation status is judged to be reasonable and necessary in order to provide the required intensity of service to ensure the patient's safety. The patient's presenting symptoms, physical exam findings, and initial radiographic and laboratory data in the context of their medical condition is felt to place them at decreased risk for further clinical deterioration. Furthermore, it is anticipated that the patient will be medically stable for discharge from the hospital within 2 midnights of admission.   Author: Athena Masse, MD 04/20/2022 3:46 AM  For on call review [email or web address].

## 2022-04-20 NOTE — Assessment & Plan Note (Signed)
Continue propranolol and amlodipine

## 2022-04-20 NOTE — Assessment & Plan Note (Signed)
Continue rosuvastatin.  Intolerant of aspirin and Plavix

## 2022-04-20 NOTE — ED Notes (Signed)
Pt reports 8/10 sharp L sided cp. 2 doses of nitro given 5 min apart. BP 97/54 after second dosing, holding 3rd dose. Pt reports cp completely resolved.   Pt BGL 60, given 1 cup of Orange Juice. BGL recheck 81. Pt slowly sipping on second cup of juice.

## 2022-04-20 NOTE — Discharge Summary (Signed)
Physician Discharge Summary   Margaret Vazquez  female DOB: 01/28/1936  YQM:[id number]  PCP: Margaret Greenspan, MD  Admit date: 04/20/2022 Discharge date: 04/20/2022  Admitted From: home Disposition:  home CODE STATUS: Full code  Discharge Instructions     Discharge instructions   Complete by: As directed    There is no signs of cardiac injury.  Since you had a normal stress test this year, our cardiologist recommends following up with your outpatient cardiologist in a week.   Dr. Enzo Vazquez [facility] Course:  For full details, please see H&P, progress notes, consult notes and ancillary notes.  Briefly,  Margaret Vazquez is a 86 y.o. female with medical history significant for DM 2, HTN, prior CVA, paroxysmal A-fib on apixaban, CAD s/p PCI 2019, allergic to aspirin and Plavix, who presented to the ED with chest pain radiating to the left neck, unrelieved after 3 sublingual nitroglycerin tablets.  * Chest pain, ACS ruled out CAD s/p stent 2019 cardiac cath 12/24/17 showed 30% mid RCA and 80% prox-mid Cx. She received DES to mid AV groove Cx. EF 55-65% --Trop neg x2, EKG nonacute --discussed with oncall cardio, and since pt had a normal stress test this year, recommended following up with pt's outpatient cardiologist Margaret Vazquez in a week.  PAF (paroxysmal atrial fibrillation) (HCC) Continue apixaban and propranolol   Type 2 diabetes mellitus with diabetic peripheral angiopathy without gangrene, without long-term current use of insulin (Margaret Vazquez) --resume metformin after discharge. --pt was not taking glipizide PTA.   History of CVA (cerebrovascular accident) Continue rosuvastatin.  Intolerant of aspirin and Plavix   Essential hypertension Continue propranolol and amlodipine and Imdur   Discharge Diagnoses:  Principal Problem:   Chest pain Active Problems:   Essential hypertension   History of CVA (cerebrovascular accident)   Type 2 diabetes mellitus with diabetic  peripheral angiopathy without gangrene, without long-term current use of insulin (HCC)   PAF (paroxysmal atrial fibrillation) ([place])   Coronary artery disease   Type II diabetes mellitus ([place])     Discharge Instructions:  Allergies as of 04/20/2022       Reactions   Bee Venom Hives, Shortness Of Breath, Swelling   Nabumetone Anaphylaxis   Amoxicillin-pot Clavulanate Hives, Swelling   To lips.   Aspirin Hives   Atorvastatin Swelling    joint pain/swelling, inc liver tests   Clopidogrel Bisulfate Hives   Codeine Nausea And Vomiting   Ezetimibe Other (See Comments)    fatigue   Metformin And Related Other (See Comments)   Diarrhea    Valsartan Other (See Comments)    fatigue   Other Hives   **Red Meat**  SOB        Medication List     STOP taking these medications    glipiZIDE 10 MG 24 hr tablet Commonly known as: GLUCOTROL XL       TAKE these medications    amLODipine 2.5 MG tablet Commonly known as: NORVASC Take 1 tablet (2.5 mg) by mouth once daily   apixaban 5 MG Tabs tablet Commonly known as: Eliquis Take 1 tablet (5 mg total) by mouth 2 (two) times daily.   blood glucose meter kit and supplies Dispense based on patient and insurance preference. Use up to four times daily as directed. (FOR ICD-10 E10.9, E11.9).   diphenhydrAMINE 25 MG tablet Commonly known as: BENADRYL Take 50 mg by mouth every 8 (eight) hours as needed for allergies.  EPINEPHrine 0.3 mg/0.3 mL Devi Commonly known as: EPI-PEN Inject 0.3 mg into the muscle daily as needed (allergic reaction).   fluticasone 50 MCG/ACT nasal spray Commonly known as: FLONASE Place 2 sprays into both nostrils 2 (two) times daily as needed. Reported on 09/10/2015   isosorbide mononitrate 60 MG 24 hr tablet Commonly known as: IMDUR TAKE 1 TABLET(60 MG) BY MOUTH DAILY   metFORMIN 500 MG 24 hr tablet Commonly known as: GLUCOPHAGE-XR TAKE 1 TABLET(500 MG) BY MOUTH DAILY WITH BREAKFAST    nitroGLYCERIN 0.4 MG SL tablet Commonly known as: Nitrostat Place 1 tablet (0.4 mg total) under the tongue every 5 (five) minutes as needed for chest pain (up to 3 doses. If taking 3rd dose call 911).   ONE TOUCH ULTRA 2 w/Device Kit Use to check blood sugar once daily   OneTouch Ultra test strip Generic drug: glucose blood USE TO CHECK BLOOD SUGAR ONCE DAILY AS DIRECTED   potassium chloride SA 20 MEQ tablet Commonly known as: KLOR-CON M Take 1 tablet (20 mEq total) by mouth daily.   propranolol 10 MG tablet Commonly known as: INDERAL Take 1 tablet (10 mg) by mouth every 1 hour x 3 doses as needed for chest pain/ fast heart rates   rosuvastatin 5 MG tablet Commonly known as: CRESTOR Take 1 tablet (5 mg) by mouth twice a week   VITAMIN B-12 PO Take 1 tablet by mouth daily.         Follow-up Information     Margaret David, MD Follow up in 1 week(s).   Specialty: Cardiology Contact information: [place] [address] [phone]                 Allergies  Allergen Reactions   Bee Venom Hives, Shortness Of Breath and Swelling   Nabumetone Anaphylaxis   Amoxicillin-Pot Clavulanate Hives and Swelling    To lips.   Aspirin Hives   Atorvastatin Swelling     joint pain/swelling, inc liver tests   Clopidogrel Bisulfate Hives   Codeine Nausea And Vomiting   Ezetimibe Other (See Comments)     fatigue   Metformin And Related Other (See Comments)    Diarrhea    Valsartan Other (See Comments)     fatigue   Other Hives    **Red Meat**  SOB     The results of significant diagnostics from this hospitalization (including imaging, microbiology, ancillary and laboratory) are listed below for reference.   Consultations:   Procedures/Studies: DG Chest 2 View  Result Date: 04/20/2022 CLINICAL DATA:  Chest pain EXAM: CHEST - 2 VIEW COMPARISON:  03/21/2020 FINDINGS: Heart and mediastinal contours are within normal limits. No focal opacities or  effusions. No acute bony abnormality. IMPRESSION: No active cardiopulmonary disease. Electronically Signed   By: Rolm Baptise M.D.   On: 04/20/2022 02:10   VAS US CAROTID  Result Date: 03/25/2022 Carotid Arterial Duplex Study Patient Name:  Margaret Vazquez  Date of Exam:   03/25/2022 Medical Rec #: [id number]       Accession #:    [id number] Date of Birth: [birth year]       Patient Gender: F Patient Age:   77 years Exam Location:  [place] Procedure:      VAS US CAROTID Referring Phys: Virl Axe --------------------------------------------------------------------------------  Indications:       Carotid artery disease. Risk Factors:      Hypertension, hyperlipidemia, Diabetes, no history of  smoking, coronary artery disease. Other Factors:     Patient denies any cerebrovascular symptoms. Comparison Study:  08/1306-MVHQ 469/62 cm/s: LICA 952/84 cm/s Performing Technologist: Wilkie Aye RVT  Examination Guidelines: A complete evaluation includes B-mode imaging, spectral Doppler, color Doppler, and power Doppler as needed of all accessible portions of each vessel. Bilateral testing is considered an integral part of a complete examination. Limited examinations for reoccurring indications may be performed as noted.  Right Carotid Findings: +----------+--------+--------+--------+------------------+--------+           PSV cm/sEDV cm/sStenosisPlaque DescriptionComments +----------+--------+--------+--------+------------------+--------+ CCA Prox  107     17                                         +----------+--------+--------+--------+------------------+--------+ CCA Distal53      14                                         +----------+--------+--------+--------+------------------+--------+ ICA Prox  135     34      1-39%   heterogenous               +----------+--------+--------+--------+------------------+--------+ ICA Mid   64      15                                          +----------+--------+--------+--------+------------------+--------+ ICA Distal66      21                                         +----------+--------+--------+--------+------------------+--------+ ECA       67                                                 +----------+--------+--------+--------+------------------+--------+ +----------+--------+-------+---------+-------------------+           PSV cm/sEDV cmsDescribe Arm Pressure (mmHG) +----------+--------+-------+---------+-------------------+ Subclavian190            Turbulent144                 +----------+--------+-------+---------+-------------------+ +---------+--------+--+--------+--------------+ VertebralPSV cm/s35EDV cm/sHigh resistant +---------+--------+--+--------+--------------+  Left Carotid Findings: +----------+--------+--------+--------+------------------+--------+           PSV cm/sEDV cm/sStenosisPlaque DescriptionComments +----------+--------+--------+--------+------------------+--------+ CCA Prox  102     17                                         +----------+--------+--------+--------+------------------+--------+ CCA Distal49      10                                         +----------+--------+--------+--------+------------------+--------+ ICA Prox  63      21      1-39%   heterogenous               +----------+--------+--------+--------+------------------+--------+ ICA Mid   86  26                                         +----------+--------+--------+--------+------------------+--------+ ICA Distal78      21                                         +----------+--------+--------+--------+------------------+--------+ ECA       86      76                                         +----------+--------+--------+--------+------------------+--------+ +----------+--------+--------+----------------+-------------------+           PSV cm/sEDV cm/sDescribe         Arm Pressure (mmHG) +----------+--------+--------+----------------+-------------------+ [id number]             Multiphasic, [id number]                 +----------+--------+--------+----------------+-------------------+ +---------+--------+--+--------+-+---------+ VertebralPSV cm/s36EDV cm/s9Antegrade +---------+--------+--+--------+-+---------+   Summary: Right Carotid: Velocities in the right ICA are consistent with a 1-39% stenosis. Left Carotid: Velocities in the left ICA are consistent with a 1-39% stenosis. Vertebrals:  Left vertebral artery demonstrates antegrade flow. Right vertebral              artery demonstrates high resistant flow. Subclavians: Right subclavian artery flow was disturbed. Normal flow              hemodynamics were seen in the left subclavian artery. *See table(s) above for measurements and observations.  Electronically signed by Quay Burow MD on 03/25/2022 at 3:55:29 PM.    Final       Labs: BNP (last 3 results) No results for input(s): "BNP" in the last 8760 hours. Basic Metabolic Panel: Recent Labs  Lab 04/20/22 0154  NA 140  K 4.1  CL 110  CO2 24  GLUCOSE 130*  BUN 22  CREATININE 0.98  CALCIUM 9.0   Liver Function Tests: No results for input(s): "AST", "ALT", "ALKPHOS", "BILITOT", "PROT", "ALBUMIN" in the last 168 hours. No results for input(s): "LIPASE", "AMYLASE" in the last 168 hours. No results for input(s): "AMMONIA" in the last 168 hours. CBC: Recent Labs  Lab 04/20/22 0154  WBC 5.3  HGB 14.0  HCT 41.8  MCV 94.8  PLT 179   Cardiac Enzymes: No results for input(s): "CKTOTAL", "CKMB", "CKMBINDEX", "TROPONINI" in the last 168 hours. BNP: Invalid input(s): "POCBNP" CBG: Recent Labs  Lab 04/20/22 0546 04/20/22 0608 04/20/22 0742  GLUCAP 60* 81 77   D-Dimer No results for input(s): "DDIMER" in the last 72 hours. Hgb A1c No results for input(s): "HGBA1C" in the last 72 hours. Lipid Profile No results for input(s):  "CHOL", "HDL", "LDLCALC", "TRIG", "CHOLHDL", "LDLDIRECT" in the last 72 hours. Thyroid function studies No results for input(s): "TSH", "T4TOTAL", "T3FREE", "THYROIDAB" in the last 72 hours.  Invalid input(s): "FREET3" Anemia work up No results for input(s): "VITAMINB12", "FOLATE", "FERRITIN", "TIBC", "IRON", "RETICCTPCT" in the last 72 hours. Urinalysis    Component Value Date/Time   COLORURINE YELLOW (A) 07/05/2016 1429   APPEARANCEUR CLEAR (A) 07/05/2016 1429   APPEARANCEUR Clear 08/29/2011 2349   LABSPEC 1.025 07/05/2016 1429   LABSPEC 1.010 08/29/2011 2349   PHURINE 5.0 07/05/2016 1429  GLUCOSEU NEGATIVE 07/05/2016 1429   GLUCOSEU Negative 08/29/2011 2349   HGBUR SMALL (A) 07/05/2016 1429   HGBUR moderate 04/02/2010 1208   BILIRUBINUR neg 11/23/2019 1032   BILIRUBINUR Negative 08/29/2011 2349   KETONESUR NEGATIVE 07/05/2016 1429   PROTEINUR Negative 11/23/2019 1032   PROTEINUR 30 (A) 07/05/2016 1429   UROBILINOGEN 0.2 11/23/2019 1032   UROBILINOGEN 1.0 06/23/2014 1906   NITRITE neg 11/23/2019 1032   NITRITE NEGATIVE 07/05/2016 1429   LEUKOCYTESUR Small (1+) (A) 11/23/2019 1032   LEUKOCYTESUR Trace 08/29/2011 2349   Sepsis Labs Recent Labs  Lab 04/20/22 0154  WBC 5.3   Microbiology No results found for this or any previous visit (from the past 240 hour(s)).   Total time spend on discharging this patient, including the last patient exam, discussing the hospital stay, instructions for ongoing care as it relates to all pertinent caregivers, as well as preparing the medical discharge records, prescriptions, and/or referrals as applicable, is 45 minutes.    Margaret Bi, MD  [facility] 04/20/2022, 9:09 AM

## 2022-04-20 NOTE — Assessment & Plan Note (Signed)
Continue apixaban and propranolol

## 2022-04-20 NOTE — ED Triage Notes (Signed)
Pt presents to ER with complaints of chest pain since 09:30pm last night. Pt reports she took one tylenol and 3 NITRO with no relief. Pt reports extensive cardiac history. Pt talks in complete sentences no respiratory distress noted at present

## 2022-04-22 LAB — LIPOPROTEIN A (LPA): Lipoprotein (a): 28.5 nmol/L (ref <75.0)

## 2022-04-24 ENCOUNTER — Ambulatory Visit: Payer: HMO | Admitting: Medical

## 2022-04-29 ENCOUNTER — Encounter: Payer: Self-pay | Admitting: Family Medicine

## 2022-04-29 ENCOUNTER — Ambulatory Visit: Payer: HMO | Admitting: Family Medicine

## 2022-04-29 ENCOUNTER — Ambulatory Visit (INDEPENDENT_AMBULATORY_CARE_PROVIDER_SITE_OTHER): Payer: HMO | Admitting: Family Medicine

## 2022-04-29 VITALS — BP 106/50 | HR 91 | Temp 97.8°F | Ht 67.5 in | Wt 188.0 lb

## 2022-04-29 DIAGNOSIS — E1151 Type 2 diabetes mellitus with diabetic peripheral angiopathy without gangrene: Secondary | ICD-10-CM | POA: Diagnosis not present

## 2022-04-29 DIAGNOSIS — E785 Hyperlipidemia, unspecified: Secondary | ICD-10-CM

## 2022-04-29 DIAGNOSIS — I480 Paroxysmal atrial fibrillation: Secondary | ICD-10-CM

## 2022-04-29 DIAGNOSIS — R079 Chest pain, unspecified: Secondary | ICD-10-CM

## 2022-04-29 DIAGNOSIS — I10 Essential (primary) hypertension: Secondary | ICD-10-CM

## 2022-04-29 DIAGNOSIS — E1169 Type 2 diabetes mellitus with other specified complication: Secondary | ICD-10-CM

## 2022-04-29 LAB — POCT GLYCOSYLATED HEMOGLOBIN (HGB A1C): Hemoglobin A1C: 6.2 % — AB (ref 4.0–5.6)

## 2022-04-29 MED ORDER — GLIPIZIDE ER 10 MG PO TB24: 10.0000 mg | ORAL_TABLET | Freq: Every day | ORAL | 0 refills | Status: DC

## 2022-04-29 NOTE — Progress Notes (Signed)
Subjective:    Patient ID: Margaret Vazquez, female    DOB: 03/24/1936, 86 y.o.   MRN: [id number]  HPI Pt presents for f/u of ER visit on 10/15 for chest pain   Wt Readings from Last 3 Encounters:  04/29/22 188 lb (85.3 kg)  04/20/22 198 lb (89.8 kg)  02/21/22 193 lb 9.6 oz (87.8 kg)   29.01 kg/m   Pt presented from home after cp not relieved with 3 nitro tabs and tylenol  Bp was 162/74 No acute changes in EKG  rate 59 then cardiac monitor   Nl cxr   Lab Results  Component Value Date   CKTOTAL 103 08/05/2012   CKMB 3.0 08/05/2012   TROPONINI <0.03 07/05/2016   Lab Results  Component Value Date   WBC 5.3 04/20/2022   HGB 14.0 04/20/2022   HCT 41.8 04/20/2022   MCV 94.8 04/20/2022   PLT 179 04/20/2022   Lab Results  Component Value Date   CREATININE 0.98 04/20/2022   BUN 22 04/20/2022   NA 140 04/20/2022   K 4.1 04/20/2022   CL 110 04/20/2022   CO2 24 04/20/2022   Tx with MS and zofran and 02 Was observed and released the same day  Noted nl stress test within the year  Recommended cardiology f/u in a week   Hosp course Hospital Course:  For full details, please see H&P, progress notes, consult notes and ancillary notes.  Briefly,  Margaret Vazquez is a 86 y.o. female with medical history significant for DM 2, HTN, prior CVA, paroxysmal A-fib on apixaban, CAD s/p PCI 2019, allergic to aspirin and Plavix, who presented to the ED with chest pain radiating to the left neck, unrelieved after 3 sublingual nitroglycerin tablets.   * Chest pain, ACS ruled out CAD s/p stent 2019 cardiac cath 12/24/17 showed 30% mid RCA and 80% prox-mid Cx. She received DES to mid AV groove Cx. EF 55-65% --Trop neg x2, EKG nonacute --discussed with oncall cardio, and since pt had a normal stress test this year, recommended following up with pt's outpatient cardiologist Dr. Humphrey Rolls in a week.   PAF (paroxysmal atrial fibrillation) (HCC) Continue apixaban and propranolol   Type 2 diabetes  mellitus with diabetic peripheral angiopathy without gangrene, without long-term current use of insulin ([place]) --resume metformin after discharge. --pt was not taking glipizide PTA.   History of CVA (cerebrovascular accident) Continue rosuvastatin.  Intolerant of aspirin and Plavix   Essential hypertension Continue propranolol and amlodipine and Imdur   Has appt with Barrett Henle PA at cardiology on 11/13    BP Readings from Last 3 Encounters:  04/29/22 (!) 106/50  04/20/22 119/61  02/21/22 136/70   Pulse Readings from Last 3 Encounters:  04/29/22 91  04/20/22 (!) 52  02/21/22 76   Bp at home is usually 100s/50s  Amlodipine 2.5 mg daily  Imdur 60 mg daily  Propranolol 10 mg as needed/does not take   Stress test was nl in February   DM2 Lab Results  Component Value Date   HGBA1C 6.6 (H) 10/31/2021   Metformin xr 500 daily  SGLT - did not tolerate  Back on glipizide   Had low sugar in the ER  Now back to normal at home on just metformin and glipizide    Hyperlipidemia Lab Results  Component Value Date   CHOL 186 10/31/2021   HDL 51.20 10/31/2021   LDLCALC 110 (H) 10/31/2021   LDLDIRECT 119.0 05/01/2021   TRIG 124.0  10/31/2021   CHOLHDL 4 10/31/2021   Crestor 5 mg twice weekly   Had several bx from dermatology today  Pending reports   Patient Active Problem List   Diagnosis Date Noted   Chest pain 04/20/2022   Type II diabetes mellitus ([place])    Estrogen deficiency 11/07/2021   Grief reaction 05/08/2021   Elevated TSH 11/05/2020   Allergy to statin medication 03/26/2020   History of loop recorder 09/06/2019   History of seizure 03/23/2019   Left-sided epistaxis 11/17/2018   Medicare annual wellness visit, subsequent 09/13/2018   Screening mammogram, encounter for 09/13/2018   Herpes zoster 08/10/2018   Coronary artery disease 12/24/2017   Routine general medical examination at a health care facility 08/24/2016   PAF (paroxysmal atrial  fibrillation) ([place]) 04/07/2016   Overweight (BMI 25.0-29.9) 08/23/2014   Type 2 diabetes mellitus with diabetic peripheral angiopathy without gangrene, without long-term current use of insulin ([place]) 01/12/2014   TIA (transient ischemic attack) 01/11/2014   History of CVA (cerebrovascular accident) 01/11/2014   LOOP Recorder LINQ 06/07/2013   Sinusitis, chronic 05/16/2013   H/O: CVA (cerebrovascular accident) 02/15/2013   Encounter for Medicare annual wellness exam 10/27/2012   Encopresis(307.7) 12/15/2011   Post-menopausal 10/28/2011   PERSONAL HX COLONIC POLYPS 09/04/2009   B12 deficiency 03/06/2009   ALLERGIC RHINITIS 02/14/2008   BACK PAIN, LUMBAR 11/03/2007   Hyperlipidemia associated with type 2 diabetes mellitus ([place]) 11/24/2006   Essential hypertension 11/24/2006   GERD 10/09/2006   OVERACTIVE BLADDER 10/09/2006   INCONTINENCE, URGE 10/09/2006   SKIN CANCER, HX OF 10/09/2006   Past Medical History:  Diagnosis Date   Allergy history, drug    Aspirin   Basal cell carcinoma    "back"   CAD (coronary artery disease)    a. Previously nonobstructive then progressive angina with abnl CT -> cardiac cath 12/24/17 showed 30% mid RCA and 80% prox-mid Cx. She received DES to mid AV groove Cx. EF 55-65%.    Cervical stenosis of spine    With neck pain   CKD (chronic kidney disease), stage II    Colon polyps 3734   Complication of anesthesia 1980s   slow to wake after anesthesia "when I had breast biopsy"   GERD (gastroesophageal reflux disease)    Hyperlipidemia    myalgias with Lipitor and Zetia   Hypertension    Migraine    "stopped at age 39" (12/24/2017)   Squamous carcinoma    "iced off and cut off; mostly arms" (12/24/2017)   Stroke [facility])    "told me I'd had 2 strokes in 02/2013"; denies residual on 12/24/2017   TIA (transient ischemic attack)    Type II diabetes mellitus ([place])    Past Surgical History:  Procedure Laterality Date   ABD [place]  07/2003   Negative    APPENDECTOMY     BASAL CELL CARCINOMA EXCISION     "back"   BREAST BIOPSY Left 1992   benign   CARDIAC CATHETERIZATION  04/07/2011   non obst CAD (Dr Burt Knack)   CATARACT EXTRACTION W/PHACO Left 04/01/2017   Procedure: CATARACT EXTRACTION PHACO AND INTRAOCULAR LENS PLACEMENT ([place]) LEFT DIABETIC;  Surgeon: Leandrew Koyanagi, MD;  Location: [place];  Service: Ophthalmology;  Laterality: Left;  Diabetic - oral meds   CATARACT EXTRACTION W/PHACO Right 04/29/2017   Procedure: CATARACT EXTRACTION PHACO AND INTRAOCULAR LENS PLACEMENT ([place]) RIGHT DIABETIC;  Surgeon: Leandrew Koyanagi, MD;  Location: [place];  Service: Ophthalmology;  Laterality: Right;  Diabetic -  oral meds   COLONOSCOPY  12/2007   Adenomatous colon polyps   CORONARY ANGIOPLASTY WITH STENT PLACEMENT  12/24/2017   CORONARY STENT INTERVENTION N/A 12/24/2017   Procedure: CORONARY STENT INTERVENTION;  Surgeon: Lorretta Harp, MD;  Location: [place] CV LAB;  Service: Cardiovascular;  Laterality: N/A;   CYSTOSCOPY W/ DECANNULATION  03/2000   Normal   LEFT HEART CATH AND CORONARY ANGIOGRAPHY N/A 12/24/2017   Procedure: LEFT HEART CATH AND CORONARY ANGIOGRAPHY;  Surgeon: Lorretta Harp, MD;  Location: [place] CV LAB;  Service: Cardiovascular;  Laterality: N/A;   LOOP RECORDER IMPLANT N/A 02/16/2013   Procedure: LOOP RECORDER IMPLANT;  Surgeon: Deboraha Sprang, MD;  Location[facility] CATH LAB;  Service: Cardiovascular;  Laterality: N/A;   MOHS SURGERY     right hand    NASAL SINUS SURGERY  01/2005   SQUAMOUS CELL CARCINOMA EXCISION     "mostly arms;" (12/24/2017)   STRESS CARDIOLITE  11/1999   Normal/ negative   TEAR DUCT PROBING  2005   "? side"   TEE WITHOUT CARDIOVERSION N/A 02/16/2013   Procedure: TRANSESOPHAGEAL ECHOCARDIOGRAM (TEE);  Surgeon: Josue Hector, MD;  Location[facility] ENDOSCOPY;  Service: Cardiovascular;  Laterality: N/A;   TUBAL LIGATION     BTL   Social History   Tobacco Use    Smoking status: Never   Smokeless tobacco: Never  Vaping Use   Vaping Use: Never used  Substance Use Topics   Alcohol use: Never    Alcohol/week: 0.0 standard drinks of alcohol   Drug use: Never   Family History  Problem Relation Age of Onset   Lung cancer Brother    Diabetes Brother    Pancreatic cancer Brother    Heart disease Mother    Heart disease Father    Brain cancer Other    Skin cancer Daughter    Diabetes Sister    Breast cancer Sister    Colon cancer Neg Hx    Allergies  Allergen Reactions   Bee Venom Hives, Shortness Of Breath and Swelling   Nabumetone Anaphylaxis   Amoxicillin-Pot Clavulanate Hives and Swelling    To lips.   Aspirin Hives   Atorvastatin Swelling     joint pain/swelling, inc liver tests   Clopidogrel Bisulfate Hives   Codeine Nausea And Vomiting   Ezetimibe Other (See Comments)     fatigue   Metformin And Related Other (See Comments)    Diarrhea    Valsartan Other (See Comments)     fatigue   Other Hives    **Red Meat**  SOB   Current Outpatient Medications on File Prior to Visit  Medication Sig Dispense Refill   amLODipine (NORVASC) 2.5 MG tablet Take 1 tablet (2.5 mg) by mouth once daily 90 tablet 3   apixaban (ELIQUIS) 5 MG TABS tablet Take 1 tablet (5 mg total) by mouth 2 (two) times daily. 60 tablet 5   blood glucose meter kit and supplies Dispense based on patient and insurance preference. Use up to four times daily as directed. (FOR ICD-10 E10.9, E11.9). 1 each 0   Blood Glucose Monitoring Suppl (ONE TOUCH ULTRA 2) w/Device KIT Use to check blood sugar once daily 1 each 0   Cyanocobalamin (VITAMIN B-12 PO) Take 1 tablet by mouth daily.     diphenhydrAMINE (BENADRYL) 25 MG tablet Take 50 mg by mouth every 8 (eight) hours as needed for allergies.      EPINEPHrine (EPI-PEN) 0.3 mg/0.3 mL DEVI Inject  0.3 mg into the muscle daily as needed (allergic reaction).     fluticasone (FLONASE) 50 MCG/ACT nasal spray Place 2 sprays into both  nostrils 2 (two) times daily as needed. Reported on 09/10/2015     glucose blood (ONETOUCH ULTRA) test strip USE TO CHECK BLOOD SUGAR ONCE DAILY AS DIRECTED 100 strip 1   isosorbide mononitrate (IMDUR) 60 MG 24 hr tablet TAKE 1 TABLET(60 MG) BY MOUTH DAILY 90 tablet 3   metFORMIN (GLUCOPHAGE-XR) 500 MG 24 hr tablet TAKE 1 TABLET(500 MG) BY MOUTH DAILY WITH BREAKFAST 90 tablet 3   nitroGLYCERIN (NITROSTAT) 0.4 MG SL tablet Place 1 tablet (0.4 mg total) under the tongue every 5 (five) minutes as needed for chest pain (up to 3 doses. If taking 3rd dose call 911). 25 tablet 3   potassium chloride SA (KLOR-CON M) 20 MEQ tablet Take 1 tablet (20 mEq total) by mouth daily. 90 tablet 3   propranolol (INDERAL) 10 MG tablet Take 1 tablet (10 mg) by mouth every 1 hour x 3 doses as needed for chest pain/ fast heart rates 30 tablet 1   rosuvastatin (CRESTOR) 5 MG tablet Take 1 tablet (5 mg) by mouth twice a week 30 tablet 1   Current Facility-Administered Medications on File Prior to Visit  Medication Dose Route Frequency Provider Last Rate Last Admin   Study - ORION 4 - inclisiran 300 mg/1.57m or placebo SQ injection ([facility])  300 mg Subcutaneous Q6 months SHillary Bow MD         Review of Systems  Constitutional:  Positive for fatigue. Negative for activity change, appetite change, fever and unexpected weight change.  HENT:  Negative for congestion, ear pain, rhinorrhea, sinus pressure and sore throat.   Eyes:  Negative for pain, redness and visual disturbance.  Respiratory:  Negative for cough, shortness of breath and wheezing.   Cardiovascular:  Negative for chest pain and palpitations.  Gastrointestinal:  Negative for abdominal pain, blood in stool, constipation and diarrhea.  Endocrine: Negative for polydipsia and polyuria.  Genitourinary:  Negative for dysuria, frequency and urgency.  Musculoskeletal:  Negative for arthralgias, back pain and myalgias.  Skin:  Negative for pallor and rash.        Recent derm bx- on arms/hands   Allergic/Immunologic: Negative for environmental allergies.  Neurological:  Negative for dizziness, syncope and headaches.  Hematological:  Negative for adenopathy. Does not bruise/bleed easily.  Psychiatric/Behavioral:  Negative for decreased concentration and dysphoric mood. The patient is nervous/anxious.        Objective:   Physical Exam Constitutional:      General: She is not in acute distress.    Appearance: Normal appearance. She is well-developed.     Comments: overwt  HENT:     Head: Normocephalic and atraumatic.     Mouth/Throat:     Mouth: Mucous membranes are moist.  Eyes:     General: No scleral icterus.    Conjunctiva/sclera: Conjunctivae normal.     Pupils: Pupils are equal, round, and reactive to light.  Neck:     Thyroid: No thyromegaly.     Vascular: No carotid bruit or JVD.  Cardiovascular:     Rate and Rhythm: Regular rhythm. Tachycardia present.     Heart sounds: Normal heart sounds. No murmur heard.    No gallop.  Pulmonary:     Effort: Pulmonary effort is normal. No respiratory distress.     Breath sounds: Normal breath sounds. No stridor. No wheezing, rhonchi or  rales.     Comments: No crackles  Chest:     Chest wall: No tenderness.  Abdominal:     General: There is no distension or abdominal bruit.     Palpations: Abdomen is soft. There is no mass.     Tenderness: There is no abdominal tenderness. There is no guarding or rebound.     Hernia: No hernia is present.  Musculoskeletal:     Cervical back: Normal range of motion and neck supple. No tenderness.     Right lower leg: No edema.     Left lower leg: No edema.  Lymphadenopathy:     Cervical: No cervical adenopathy.  Skin:    General: Skin is warm and dry.     Coloration: Skin is not jaundiced or pale.     Findings: No bruising, erythema or rash.  Neurological:     Mental Status: She is alert.     Coordination: Coordination normal.     Deep Tendon  Reflexes: Reflexes are normal and symmetric. Reflexes normal.  Psychiatric:        Mood and Affect: Mood normal.     Comments: Pleasant Mildly anxious            Assessment & Plan:   Problem List Items Addressed This Visit       Cardiovascular and Mediastinum   Essential hypertension    bp in fair control at this time  BP Readings from Last 1 Encounters:  04/29/22 (!) 106/50  per pt -this is often what it runs at home and she feels fine/not dizzy No changes needed Most recent labs reviewed  Disc lifstyle change with low sodium diet and exercise  Plans to continue Amlodipine 2.5 mg daily  imdur 60 mg daily  Has not needed the prn propranolol       PAF (paroxysmal atrial fibrillation) (HCC)    No changes Has not needed the propranolol       Type 2 diabetes mellitus with diabetic peripheral angiopathy without gangrene, without long-term current use of insulin ([place])    Lab Results  Component Value Date   HGBA1C 6.2 (A) 04/29/2022  Well controlled  Off jardiance /did not tolerate  Eating well  Continues metformin xr 500 mg daily and glipizide xl 28m daily  Watches for low glucose readings       Relevant Medications   glipiZIDE (GLUCOTROL XL) 10 MG 24 hr tablet   Other Relevant Orders   POCT glycosylated hemoglobin (Hb A1C) (Completed)     Endocrine   Hyperlipidemia [place] with [place] 2 diabetes mellitus [place]    Disc goals for lipids and reasons to control them Rev last labs with pt Rev low sat fat diet in detail LDL of 110 last  Unable to get it to goal with statin intol Can tolerate at most crestor 5 mg twice weekly         Relevant Medications   glipiZIDE (GLUCOTROL XL) 10 MG 24 hr tablet     Other   Chest pain - Primary    Seen in ER and obs on 10/15 and ruled out for ACS Reassuring visit  Reviewed hospital records, lab results and studies in detail  It was decided to have her f/u with cardiology outpt since recent stress test was normal   She wanted to see her own doctor so declined f/u with someone new Has appt with PA on 11/14  If cp returns-agrees to call 911 or go to ER  Doing well today / stable vitals and no cp at all

## 2022-04-29 NOTE — Assessment & Plan Note (Signed)
Seen in ER and obs on 10/15 and ruled out for ACS Reassuring visit  Reviewed hospital records, lab results and studies in detail  It was decided to have her f/u with cardiology outpt since recent stress test was normal  She wanted to see her own doctor so declined f/u with someone new Has appt with PA on 11/14  If cp returns-agrees to call 911 or go to ER  Doing well today / stable vitals and no cp at all

## 2022-04-29 NOTE — Assessment & Plan Note (Signed)
No changes Has not needed the propranolol

## 2022-04-29 NOTE — Assessment & Plan Note (Signed)
Lab Results  Component Value Date   HGBA1C 6.2 (A) 04/29/2022   Well controlled  Off jardiance /did not tolerate  Eating well  Continues metformin xr 500 mg daily and glipizide xl '10mg'$  daily  Watches for low glucose readings

## 2022-04-29 NOTE — Patient Instructions (Signed)
Let's check your a1c today   So glad you are feeling better  Eat regularly  If glucose starts getting low let us know   If your BP gets so low that you feel dizzy please let us know  Keep your appt with the cardiology PA in November and Dr. Caryl Comes in December  If you need to be seen in cardiology earlier then call for first available provider   If the chest pain returns or worsens - call 911 if you need to

## 2022-04-29 NOTE — Assessment & Plan Note (Signed)
Disc goals for lipids and reasons to control them Rev last labs with pt Rev low sat fat diet in detail LDL of 110 last  Unable to get it to goal with statin intol Can tolerate at most crestor 5 mg twice weekly

## 2022-04-29 NOTE — Assessment & Plan Note (Signed)
bp in fair control at this time  BP Readings from Last 1 Encounters:  04/29/22 (!) 106/50  per pt -this is often what it runs at home and she feels fine/not dizzy No changes needed Most recent labs reviewed  Disc lifstyle change with low sodium diet and exercise  Plans to continue Amlodipine 2.5 mg daily  imdur 60 mg daily  Has not needed the prn propranolol

## 2022-05-05 ENCOUNTER — Telehealth: Payer: Self-pay | Admitting: Family Medicine

## 2022-05-05 NOTE — Telephone Encounter (Signed)
Patient called and stated that she don't know where she suppose to get her bone density test at tomorrow. Call back number [phone].

## 2022-05-05 NOTE — Telephone Encounter (Signed)
Info given to pt

## 2022-05-06 ENCOUNTER — Ambulatory Visit
Admission: RE | Admit: 2022-05-06 | Discharge: 2022-05-06 | Disposition: A | Discharge status: Home / Self Care | Payer: HMO | Source: Ambulatory Visit | Attending: Family Medicine | Admitting: Family Medicine

## 2022-05-06 DIAGNOSIS — E2839 Other primary ovarian failure: Secondary | ICD-10-CM

## 2022-05-08 ENCOUNTER — Encounter: Payer: Self-pay | Admitting: *Deleted

## 2022-05-12 ENCOUNTER — Ambulatory Visit: Payer: HMO | Admitting: Family Medicine

## 2022-05-12 NOTE — Progress Notes (Deleted)
   Subjective:    Patient ID: Margaret Vazquez, female    DOB: [birth year], 86 y.o.   MRN: [id number]  HPI Pt presents for f/u DM2 and HTN and chronic health problems    HTN   Amlodipine 2.5 mg daily  Imdur 60 mg daily  Propranolol prn for rapid hr   Sees cardiology for a fib and CAD   In a study for [facility] 4 / inclisiran  Lab Results  Component Value Date   CREATININE 0.98 04/20/2022   BUN 22 04/20/2022   NA 140 04/20/2022   K 4.1 04/20/2022   CL 110 04/20/2022   CO2 24 04/20/2022   GFR 52.5   DM2 Lab Results  Component Value Date   HGBA1C 6.2 (A) 04/29/2022    Metformin xr 500 mg daily  Glipizide xl 10 mg daily   Hyperlipidemia Lab Results  Component Value Date   CHOL 186 10/31/2021   HDL 51.20 10/31/2021   LDLCALC 110 (H) 10/31/2021   LDLDIRECT 119.0 05/01/2021   TRIG 124.0 10/31/2021   CHOLHDL 4 10/31/2021  Crestor 5    Review of Systems     Objective:   Physical Exam        Assessment & Plan:

## 2022-05-19 ENCOUNTER — Ambulatory Visit: Payer: HMO | Attending: Medical | Admitting: Medical

## 2022-05-19 ENCOUNTER — Encounter: Payer: Self-pay | Admitting: Medical

## 2022-05-19 VITALS — BP 104/58 | HR 66 | Ht 69.0 in | Wt 189.0 lb

## 2022-05-19 DIAGNOSIS — I480 Paroxysmal atrial fibrillation: Secondary | ICD-10-CM

## 2022-05-19 DIAGNOSIS — E782 Mixed hyperlipidemia: Secondary | ICD-10-CM

## 2022-05-19 DIAGNOSIS — I2510 Atherosclerotic heart disease of native coronary artery without angina pectoris: Secondary | ICD-10-CM | POA: Diagnosis not present

## 2022-05-19 DIAGNOSIS — I10 Essential (primary) hypertension: Secondary | ICD-10-CM | POA: Diagnosis not present

## 2022-05-19 NOTE — Progress Notes (Signed)
Cardiology Office Note:    Date:  05/19/2022   ID:  NYDIA YTUARTE, DOB 04/28/1936, MRN [id number]  PCP:  Abner Greenspan, MD  [facility] Cardiologist:  Virl Axe, MD  [facility] Electrophysiologist:  Virl Axe, MD   Referring MD: Abner Greenspan, MD   Chief Complaint: 3 month follow-up  History of Present Illness:    Margaret Vazquez is a 86 y.o. female with a hx of chronotropic incompetence, exercise intolerance, s/p ILR placement for cryptogenic stroke, Afib on Eliquis, carotid artery disease, CAD, intolerant to zetia and Lipitor, HTN who presents for follow-up.   She underwent coronary CT which showed potential obstructive disease in the p-mLAD and diagonal area with abnormal FFR. On the initial coronary CT, there was mention of severe left circumflex disease, however on FFR of this area it was normal. She underwent cardiac cath 12/24/17 which showed 30% mLAD lesion, 80% p-m left circumflex lesion treated with DES, EF 55-60%.   She is allergic to ASA and Plavix. She is also allergic to statins and Zetia.   Myoview Lexsican 08/2021 showed no ischemia, normal LV perfusion and hyperdynamic LVEF 82%, low risk study.   Last seen 02/2022 and reported persistent SOB and chest discomfort. Amlodipine was added. Crestor 21m was also added. Carotid dopplers were ordered.   Carotid studies showed 1-39% bilateral stenosis.   ER visit 04/20/22 for chest pain. Reported left sided chest pain radiating to her neck. She took Tylenol and 3 NTG without relief. She denied diaphoresis, SOB, nausea/vomiting or dizziness. BP 162/75, pulse 53bpm. HS troponin negative x 2. EKG showed SB with no ischemic changes. She was kept for observation and sent home.  Today, the ER visit was reviewed. She says amlodipine is helping the pain. She has not had further chest pain. No shortness of breath. No lower leg edema, orthopea or pnd.   Past Medical History:  Diagnosis Date   Allergy history, drug     Aspirin   Basal cell carcinoma    "back"   CAD (coronary artery disease)    a. Previously nonobstructive then progressive angina with abnl CT -> cardiac cath 12/24/17 showed 30% mid RCA and 80% prox-mid Cx. She received DES to mid AV groove Cx. EF 55-65%.    Cervical stenosis of spine    With neck pain   CKD (chronic kidney disease), stage II    Colon polyps 27989  Complication of anesthesia 1980s   slow to wake after anesthesia "when I had breast biopsy"   GERD (gastroesophageal reflux disease)    Hyperlipidemia    myalgias with Lipitor and Zetia   Hypertension    Migraine    "stopped at age [age] (12/24/2017)   Squamous carcinoma    "iced off and cut off; mostly arms" (12/24/2017)   Stroke ([facility]    "told me I'd had 2 strokes in 02/2013"; denies residual on 12/24/2017   TIA (transient ischemic attack)    Type II diabetes mellitus ([place]     Past Surgical History:  Procedure Laterality Date   ABD [place] 07/2003   Negative   APPENDECTOMY     BASAL CELL CARCINOMA EXCISION     "back"   BREAST BIOPSY Left 1992   benign   CARDIAC CATHETERIZATION  04/07/2011   non obst CAD (Dr CBurt Knack   CATARACT EXTRACTION W/PHACO Left 04/01/2017   Procedure: CATARACT EXTRACTION PHACO AND INTRAOCULAR LENS PLACEMENT ([place] LEFT DIABETIC;  Surgeon: BLeandrew Koyanagi MD;  Location:  [place];  Service: Ophthalmology;  Laterality: Left;  Diabetic - oral meds   CATARACT EXTRACTION W/PHACO Right 04/29/2017   Procedure: CATARACT EXTRACTION PHACO AND INTRAOCULAR LENS PLACEMENT ([place]) RIGHT DIABETIC;  Surgeon: Leandrew Koyanagi, MD;  Location: [place];  Service: Ophthalmology;  Laterality: Right;  Diabetic - oral meds   COLONOSCOPY  12/2007   Adenomatous colon polyps   CORONARY ANGIOPLASTY WITH STENT PLACEMENT  12/24/2017   CORONARY STENT INTERVENTION N/A 12/24/2017   Procedure: CORONARY STENT INTERVENTION;  Surgeon: Lorretta Harp, MD;  Location: [place] CV LAB;  Service:  Cardiovascular;  Laterality: N/A;   CYSTOSCOPY W/ DECANNULATION  03/2000   Normal   LEFT HEART CATH AND CORONARY ANGIOGRAPHY N/A 12/24/2017   Procedure: LEFT HEART CATH AND CORONARY ANGIOGRAPHY;  Surgeon: Lorretta Harp, MD;  Location: [place] CV LAB;  Service: Cardiovascular;  Laterality: N/A;   LOOP RECORDER IMPLANT N/A 02/16/2013   Procedure: LOOP RECORDER IMPLANT;  Surgeon: Deboraha Sprang, MD;  Location: [facility] C[facility] LAB;  Service: Cardiovascular;  Laterality: N/A;   MOHS SURGERY     right hand    NASAL SINUS SURGERY  01/2005   SQUAMOUS CELL CARCINOMA EXCISION     "mostly arms;" (12/24/2017)   STRESS CARDIOLITE  11/1999   Normal/ negative   TEAR DUCT PROBING  2005   "? side"   TEE WITHOUT CARDIOVERSION N/A 02/16/2013   Procedure: TRANSESOPHAGEAL ECHOCARDIOGRAM (TEE);  Surgeon: Josue Hector, MD;  Location: [facility] ENDOSCOPY;  Service: Cardiovascular;  Laterality: N/A;   TUBAL LIGATION     BTL    Current Medications: Current Meds  Medication Sig   amLODipine (NORVASC) 2.5 MG tablet Take 1 tablet (2.5 mg) by mouth once daily   apixaban (ELIQUIS) 5 MG TABS tablet Take 1 tablet (5 mg total) by mouth 2 (two) times daily.   blood glucose meter kit and supplies Dispense based on patient and insurance preference. Use up to four times daily as directed. (FOR ICD-10 E10.9, E11.9).   Blood Glucose Monitoring Suppl (ONE TOUCH ULTRA 2) w/Device KIT Use to check blood sugar once daily   Cyanocobalamin (VITAMIN B-12 PO) Take 1 tablet by mouth daily.   diphenhydrAMINE (BENADRYL) 25 MG tablet Take 50 mg by mouth every 8 (eight) hours as needed for allergies.    EPINEPHrine (EPI-PEN) 0.3 mg/0.3 mL DEVI Inject 0.3 mg into the muscle daily as needed (allergic reaction).   fluticasone (FLONASE) 50 MCG/ACT nasal spray Place 2 sprays into both nostrils 2 (two) times daily as needed. Reported on 09/10/2015   glipiZIDE (GLUCOTROL XL) 10 MG 24 hr tablet Take 1 tablet (10 mg total) by mouth daily with breakfast.    glucose blood (ONETOUCH ULTRA) test strip USE TO CHECK BLOOD SUGAR ONCE DAILY AS DIRECTED   isosorbide mononitrate (IMDUR) 60 MG 24 hr tablet TAKE 1 TABLET(60 MG) BY MOUTH DAILY   metFORMIN (GLUCOPHAGE-XR) 500 MG 24 hr tablet TAKE 1 TABLET(500 MG) BY MOUTH DAILY WITH BREAKFAST   nitroGLYCERIN (NITROSTAT) 0.4 MG SL tablet Place 1 tablet (0.4 mg total) under the tongue every 5 (five) minutes as needed for chest pain (up to 3 doses. If taking 3rd dose call 911).   potassium chloride SA (KLOR-CON M) 20 MEQ tablet Take 1 tablet (20 mEq total) by mouth daily.   propranolol (INDERAL) 10 MG tablet Take 1 tablet (10 mg) by mouth every 1 hour x 3 doses as needed for chest pain/ fast heart rates   rosuvastatin (  CRESTOR) 5 MG tablet Take 1 tablet (5 mg) by mouth twice a week   Current Facility-Administered Medications for the 05/19/22 encounter (Office Visit) with Kathlen Mody, Nico Syme H, PA-C  Medication   Study - ORION 4 - inclisiran 300 mg/1.67m or placebo SQ injection (PI-[place])     Allergies:   Bee venom, Nabumetone, Amoxicillin-pot clavulanate, Aspirin, Atorvastatin, Clopidogrel bisulfate, Codeine, Ezetimibe, Metformin and related, Valsartan, and Other   Social History   Socioeconomic History   Marital status: Married    Spouse name: PEddie Dibbles  Number of children: 3   Years of education: 12   Highest education level: Not on file  Occupational History   Occupation: Retired    EF[employer] farm manager RETIRED  Tobacco Use   Smoking status: Never   Smokeless tobacco: Never  Vaping Use   Vaping Use: Never used  Substance and Sexual Activity   Alcohol use: Never    Alcohol/week: 0.0 standard drinks of alcohol   Drug use: Never   Sexual activity: Not Currently  Other Topics Concern   Not on file  Social History Narrative   3 children   Does not drink caffeinated beverages   Cares for SIL with dementia   Social Determinants of Health   Financial Resource Strain: Not on file  Food Insecurity: No Food  Insecurity (04/01/2021)   Hunger Vital Sign    Worried About Running Out of Food in the Last Year: Never true    Ran Out of Food in the Last Year: Never true  Transportation Needs: No Transportation Needs (04/01/2021)   PRAPARE - THydrologist(Medical): No    Lack of Transportation (Non-Medical): No  Physical Activity: Not on file  Stress: Not on file  Social Connections: Not on file     Family History: The patient's family history includes Brain cancer in an other family member; Breast cancer in her sister; Diabetes in her brother and sister; Heart disease in her father and mother; Lung cancer in her brother; Pancreatic cancer in her brother; Skin cancer in her daughter. There is no history of Colon cancer.  ROS:   Please see the history of present illness.     All other systems reviewed and are negative.  EKGs/Labs/Other Studies Reviewed:    The following studies were reviewed today:  Myoview lexiscan 08/2021 Narrative & Impression      No ST deviation was noted.   LV perfusion is normal.   There is no evidence for ischemia   LV function is hyperdynamic with calculated LVEF 82%   Study is low risk.   Coronary artery calcifications noted in the LAD and LCx    ETT 02/2021  Stress Findings The patient exercised following a manual protocol.   Stress Measurements  Baseline Vitals  Rest HR 64 bpm      Rest BP 133/60 mmHg      Peak Stress Vitals  Peak HR 104 bpm       EKG:  EKG is ordered today.  The ekg ordered today demonstrates NSR 66bpm, no ST/T wave changes  Recent Labs: 10/31/2021: ALT 10; TSH 3.02 04/20/2022: BUN 22; Creatinine, Ser 0.98; Hemoglobin 14.0; Platelets 179; Potassium 4.1; Sodium 140  Recent Lipid Panel    Component Value Date/Time   CHOL 186 10/31/2021 0902   TRIG 124.0 10/31/2021 0902   HDL 51.20 10/31/2021 0902   CHOLHDL 4 10/31/2021 0902   VLDL 24.8 10/31/2021 0902   LDLCALC 110 (H) 10/31/2021 0902  LDLDIRECT  119.0 05/01/2021 1402    Physical Exam:    VS:  BP (!) 104/58 (BP Location: Left Arm, Patient Position: Sitting, Cuff Size: Normal)   Pulse 66   Ht _0  (1.753 m)   Wt 189 lb (85.7 kg)   SpO2 94%   BMI 27.91 kg/m     Wt Readings from Last 3 Encounters:  05/19/22 189 lb (85.7 kg)  04/29/22 188 lb (85.3 kg)  04/20/22 198 lb (89.8 kg)     GEN:  Well nourished, well developed in no acute distress HEENT: Normal NECK: No JVD; No carotid bruits LYMPHATICS: No lymphadenopathy CARDIAC: RRR, no murmurs, rubs, gallops RESPIRATORY:  Clear to auscultation without rales, wheezing or rhonchi  ABDOMEN: Soft, non-tender, non-distended MUSCULOSKELETAL:  No edema; No deformity  SKIN: Warm and dry NEUROLOGIC:  Alert and oriented x 3 PSYCHIATRIC:  Normal affect   ASSESSMENT:    1. Coronary artery disease involving native coronary artery of native heart without angina pectoris   2. PAF (paroxysmal atrial fibrillation) ([place])   3. Essential hypertension   4. Hyperlipidemia, mixed    PLAN:    In order of problems listed above:  CAD with stable angina Recent ER visit for chest pain with negative work-up. She started taking amlodipine again and reported no further chest pain. She is also taking Imdur 42m daily. Continue Amlodipine 2.587mdaily, Imdur 6059maily, SL NTG and Crestor. She is not on a BB. EKG today with no ischemic changes. Myoview Lexiscan 08/2021 showed normal LV perfusion, no ischemia, overall low risk. Given she has not had recurrent chest pain with resumption of amlodipine, we will hold on further ischemic work-up at this time.   Paroxysmal Afib Patient is in NSR today. Continue Eliquis 5mg27mD for CHADSVASC of 6. She is not on rate lowering medication.  HTN BP is soft. Continue current medications.   HLD LDL 110. Continue Crestor 5mg 30mly.   Disposition: Follow up in 3 month(s) with Dr. KleinCaryl Comesigned, CadenBertholdC  05/19/2022 4:09 PM    [facility]

## 2022-05-19 NOTE — Patient Instructions (Signed)
Medication Instructions:  Your physician recommends that you continue on your current medications as directed. Please refer to the Current Medication list given to you today.  *If you need a refill on your cardiac medications before your next appointment, please call your pharmacy*   Lab Work: None needed  If you have labs (blood work) drawn today and your tests are completely normal, you will receive your results only by: [place] (if you have MyChart) OR A paper copy in the mail If you have any lab test that is abnormal or we need to change your treatment, we will call you to review the results.   Testing/Procedures: None ordered  Follow-Up: At [facility], you and your health needs are our priority.  As part of our continuing mission to provide you with exceptional heart care, we have created designated Provider Care Teams.  These Care Teams include your primary Cardiologist (physician) and Advanced Practice Providers (APPs -  Physician Assistants and Nurse Practitioners) who all work together to provide you with the care you need, when you need it.  We recommend signing up for the patient portal called "MyChart".  Sign up information is provided on this After Visit Summary.  MyChart is used to connect with patients for Virtual Visits (Telemedicine).  Patients are able to view lab/test results, encounter notes, upcoming appointments, etc.  Non-urgent messages can be sent to your provider as well.   To learn more about what you can do with MyChart, go to [email or web address].    Your next appointment:   3 month(s)  The format for your next appointment:   In Person  Provider:   You may see Virl Axe, MD or one of the following Advanced Practice Providers on your designated Care Team:   Murray Hodgkins, NP Christell Faith, PA-C Cadence Kathlen Mody, PA-C Gerrie Nordmann, NP   Important Information About Sugar

## 2022-06-10 ENCOUNTER — Telehealth: Payer: Self-pay | Admitting: Internal Medicine

## 2022-06-10 MED ORDER — APIXABAN 5 MG PO TABS: 5.0000 mg | ORAL_TABLET | Freq: Two times a day (BID) | ORAL | 2 refills | Status: DC

## 2022-06-10 NOTE — Telephone Encounter (Signed)
*  STAT* If patient is at the pharmacy, call can be transferred to refill team.   1. Which medications need to be refilled? (please list name of each medication and dose if known) apixaban (ELIQUIS) 5 MG TABS tablet   2. Which pharmacy/location (including street and city if local pharmacy) is medication to be sent to? [id number] - [place], [place] - [address] OF [address]   3. Do they need a 30 day or 90 day supply? 90  

## 2022-06-16 LAB — HM DIABETES EYE EXAM

## 2022-06-26 ENCOUNTER — Encounter: Payer: Self-pay | Admitting: Family Medicine

## 2022-07-03 ENCOUNTER — Telehealth: Payer: Self-pay | Admitting: Family Medicine

## 2022-07-03 NOTE — Telephone Encounter (Signed)
Patient called in stating that her blood pressure is 100/48 and her heart rate is 68. She stated she experiencing some shortness of breath. Sent over to access nurse.

## 2022-07-03 NOTE — Telephone Encounter (Signed)
access nurse calling office that pt refusing disposition to go to ED. Pt said today upon exertion pt has been SOB. Pt sitting now but no SOB unless up moving around.  Pt has bilateral lower leg swelling in calves that goes to knees.and this is a new symptom for pt. Pt said SOB started this afternoon around 12 noon and has not gotten better when up moving around; pt said pt feels really tired today. Pt denies CP, H/A or dizziness. Pt said BP this afternoon 100/48 P 68 and later recked BP 101/48 P in 60s. Pt said card has been monitoring pt to see if needs pacemaker. Pt has been taking amlodipine 2.5 mg daily and has not missed med. Pt said she does not want to go to ED or UC so will not catch germs.no available appts at any [facility] office this afternoon and [facility] has no available appts on 07/04/22. I asked pt if she has spoken with cardiology and pt said she tried to call but called wrong #. I did warm transfer for pt to [facility] cardiology in [place]; I transferred pt to [place]. Pt had been given UC & ED precautions.  Sending note to Dr Glori Bickers who is out of office this afternoon;tower pool and will team [place] CMA.   [place] [facility] - Client TELEPHONE ADVICE RECORD AccessNurse Patient Name: Margaret Vazquez AS Gender: Female DOB: [birth year] Age: 86 Y 50 M Return Phone Number: [phone] (Primary) Address: City/ State/ Zip: [place]  [address] Client [facility] [place] [facility] - Client Client Site [place] Provider Tower, Roque Lias - MD Contact Type Call Who Is Calling Patient / Member / Family / Caregiver Call Type Triage / Clinical Relationship To Patient Self Return Phone Number Please choose phone number Chief Complaint BREATHING - shortness of breath or sounds breathless Reason for Call Symptomatic / Request for Health Information Initial Comment Answering service. Caller states 100/48 BP, 68 HR and SOB. Translation No Nurse  Assessment Nurse: Lenon Curt, RN, Melanie Date/Time (Eastern Time): 07/03/2022 3:32:57 PM Confirm and document reason for call. If symptomatic, describe symptoms. ---Caller states 100/48 BP, 68 HR and SOB with activity. Already to BP meds this am. Does the patient have any new or worsening symptoms? ---Yes Will a triage be completed? ---Yes Related visit to physician within the last 2 weeks? ---No Does the PT have any chronic conditions? (i.e. diabetes, asthma, this includes High risk factors for pregnancy, etc.) ---Yes List chronic conditions. ---HTN, DM, afib and heart blockage. Sees Dr. Cleda Mccreedy - Cardiology Is this a behavioral health or substance abuse call? ---No Guidelines Guideline Title Affirmed Question Affirmed Notes Nurse Date/Time Eilene Ghazi Time) Leg Swelling and Edema [1] Difficulty breathing with exertion (e.g., walking) AND [2] new-onset or worsening [place] Found 07/03/2022 3:37:03 PM Disp. Time Eilene Ghazi Time) Disposition Final User 07/03/2022 3:29:59 PM Send to Urgent Queue Sinclair-Janisch, Renee PLEASE NOTE: All timestamps contained within this report are represented as [place] Standard Time. CONFIDENTIALTY NOTICE: This fax transmission is intended only for the addressee. It contains information that is legally privileged, confidential or otherwise protected from use or disclosure. If you are not the intended recipient, you are strictly prohibited from reviewing, disclosing, copying using or disseminating any of this information or taking any action in reliance on or regarding this information. If you have received this fax in error, please notify us immediately by telephone so that we can arrange for its return to [place]. Phone:  [phone], Toll-Free: [phone], Fax: [phone] Page: 2 of 2 Call Id: [id number] [place]. Time Eilene Ghazi Time) Disposition Final User 07/03/2022 3:38:25 PM Go to ED Now (or PCP triage) Yes Lenon Curt, RN, Melanie Final  Disposition 07/03/2022 3:38:25 PM Go to ED Now (or PCP triage) Yes Lenon Curt, RN, Donnajean Lopes Disagree/Comply Disagree Caller Understands Yes PreDisposition Call Doctor Care Advice Given Per Guideline GO TO ED NOW (OR PCP TRIAGE): * IF NO PCP (PRIMARY CARE PROVIDER) SECOND-LEVEL TRIAGE: You need to be seen within the next hour. Go to the [facility] at _____________ [place] as soon as you can. ANOTHER ADULT SHOULD DRIVE: * It is better and safer if another adult drives instead of you. CARE ADVICE given per Leg Swelling and Edema (Adult) guideline. Comments User: Erik Obey, RN Date/Time Eilene Ghazi Time): 07/03/2022 3:36:47 PM 120-140s/50-60s Changed BP med from Losartan to Amlodipine 2-3 months ago. New leg swelling a week or so ago. NP granddaughter said call MD. User: Erik Obey, RN Date/Time Eilene Ghazi Time): 07/03/2022 3:43:25 PM Called backline spoke to nurse [place], no appts, requested to speak to patient to discuss. Referrals Warm transfer to [facility]

## 2022-07-03 NOTE — Telephone Encounter (Signed)
Thanks for the heads up and transferring her  I will watch for correspondence

## 2022-07-07 HISTORY — PX: OTHER SURGICAL HISTORY: SHX169

## 2022-07-08 ENCOUNTER — Ambulatory Visit: Payer: HMO | Attending: Cardiology | Admitting: Cardiology

## 2022-07-08 ENCOUNTER — Other Ambulatory Visit
Admission: RE | Admit: 2022-07-08 | Discharge: 2022-07-08 | Disposition: A | Discharge status: Home / Self Care | Payer: HMO | Attending: Cardiology | Admitting: Cardiology

## 2022-07-08 ENCOUNTER — Encounter: Payer: Self-pay | Admitting: Cardiology

## 2022-07-08 VITALS — BP 150/72 | HR 60 | Ht 68.0 in | Wt 191.4 lb

## 2022-07-08 DIAGNOSIS — I10 Essential (primary) hypertension: Secondary | ICD-10-CM

## 2022-07-08 DIAGNOSIS — R0602 Shortness of breath: Secondary | ICD-10-CM

## 2022-07-08 DIAGNOSIS — I2510 Atherosclerotic heart disease of native coronary artery without angina pectoris: Secondary | ICD-10-CM | POA: Diagnosis not present

## 2022-07-08 DIAGNOSIS — I480 Paroxysmal atrial fibrillation: Secondary | ICD-10-CM

## 2022-07-08 DIAGNOSIS — E782 Mixed hyperlipidemia: Secondary | ICD-10-CM

## 2022-07-08 DIAGNOSIS — R609 Edema, unspecified: Secondary | ICD-10-CM | POA: Diagnosis not present

## 2022-07-08 LAB — CBC
HCT: 42.3 % (ref 36.0–46.0)
Hemoglobin: 13.8 g/dL (ref 12.0–15.0)
MCH: 31.2 pg (ref 26.0–34.0)
MCHC: 32.6 g/dL (ref 30.0–36.0)
MCV: 95.7 fL (ref 80.0–100.0)
Platelets: 209 10*3/uL (ref 150–400)
RBC: 4.42 MIL/uL (ref 3.87–5.11)
RDW: 12.4 % (ref 11.5–15.5)
WBC: 6.3 10*3/uL (ref 4.0–10.5)
nRBC: 0 % (ref 0.0–0.2)

## 2022-07-08 LAB — BASIC METABOLIC PANEL
Anion gap: 9 (ref 5–15)
BUN: 16 mg/dL (ref 8–23)
CO2: 27 mmol/L (ref 22–32)
Calcium: 9.2 mg/dL (ref 8.9–10.3)
Chloride: 106 mmol/L (ref 98–111)
Creatinine, Ser: 0.94 mg/dL (ref 0.44–1.00)
GFR, Estimated: 60 mL/min — ABNORMAL LOW (ref >60)
Glucose, Bld: 119 mg/dL — ABNORMAL HIGH (ref 70–99)
Potassium: 4.2 mmol/L (ref 3.5–5.1)
Sodium: 142 mmol/L (ref 135–145)

## 2022-07-08 LAB — BRAIN NATRIURETIC PEPTIDE: B Natriuretic Peptide: 276.9 pg/mL — ABNORMAL HIGH (ref 0.0–100.0)

## 2022-07-08 NOTE — Progress Notes (Signed)
No anemia noted, kidney function remains normal and electrolytes are normal, blood glucose was slightly elevated. BNP which is the marker for extra fluid is mildly elevated. Without symptoms currently will re-evaluate these results after echocardiogram is completed.

## 2022-07-08 NOTE — Progress Notes (Signed)
Cardiology Clinic Note   Patient Name: Margaret Vazquez Date of Encounter: 07/08/2022  Primary Care Provider:  Abner Greenspan, MD Primary Cardiologist:  Virl Axe, MD  Patient Profile    86 year old female with a history of chronotropic incompetence, exercise intolerance, status post ILR placement for cryptogenic stroke, atrial fibrillation on apixaban, carotid artery disease, coronary artery disease, intolerant to statins and Zetia, and hypertension, who presents today for follow-up.  Past Medical History    Past Medical History:  Diagnosis Date   Allergy history, drug    Aspirin   Basal cell carcinoma    "back"   CAD (coronary artery disease)    a. Previously nonobstructive then progressive angina with abnl CT -> cardiac cath 12/24/17 showed 30% mid RCA and 80% prox-mid Cx. She received DES to mid AV groove Cx. EF 55-65%.    Cervical stenosis of spine    With neck pain   CKD (chronic kidney disease), stage II    Colon polyps 5997   Complication of anesthesia 1980s   slow to wake after anesthesia "when I had breast biopsy"   GERD (gastroesophageal reflux disease)    Hyperlipidemia    myalgias with Lipitor and Zetia   Hypertension    Migraine    "stopped at age 39" (12/24/2017)   Squamous carcinoma    "iced off and cut off; mostly arms" (12/24/2017)   Stroke [facility])    "told me I'd had 2 strokes in 02/2013"; denies residual on 12/24/2017   TIA (transient ischemic attack)    Type II diabetes mellitus ([place])    Past Surgical History:  Procedure Laterality Date   ABD [place]  07/2003   Negative   APPENDECTOMY     BASAL CELL CARCINOMA EXCISION     "back"   BREAST BIOPSY Left 1992   benign   CARDIAC CATHETERIZATION  04/07/2011   non obst CAD (Dr Burt Knack)   CATARACT EXTRACTION W/PHACO Left 04/01/2017   Procedure: CATARACT EXTRACTION PHACO AND INTRAOCULAR LENS PLACEMENT ([place]) LEFT DIABETIC;  Surgeon: Leandrew Koyanagi, MD;  Location: [place];  Service:  Ophthalmology;  Laterality: Left;  Diabetic - oral meds   CATARACT EXTRACTION W/PHACO Right 04/29/2017   Procedure: CATARACT EXTRACTION PHACO AND INTRAOCULAR LENS PLACEMENT ([place]) RIGHT DIABETIC;  Surgeon: Leandrew Koyanagi, MD;  Location: [place];  Service: Ophthalmology;  Laterality: Right;  Diabetic - oral meds   COLONOSCOPY  12/2007   Adenomatous colon polyps   CORONARY ANGIOPLASTY WITH STENT PLACEMENT  12/24/2017   CORONARY STENT INTERVENTION N/A 12/24/2017   Procedure: CORONARY STENT INTERVENTION;  Surgeon: Lorretta Harp, MD;  Location: [facility] CV LAB;  Service: Cardiovascular;  Laterality: N/A;   CYSTOSCOPY W/ DECANNULATION  03/2000   Normal   LEFT HEART CATH AND CORONARY ANGIOGRAPHY N/A 12/24/2017   Procedure: LEFT HEART CATH AND CORONARY ANGIOGRAPHY;  Surgeon: Lorretta Harp, MD;  Location: [place] CV LAB;  Service: Cardiovascular;  Laterality: N/A;   LOOP RECORDER IMPLANT N/A 02/16/2013   Procedure: LOOP RECORDER IMPLANT;  Surgeon: Deboraha Sprang, MD;  Location: [facility] CATH LAB;  Service: Cardiovascular;  Laterality: N/A;   MOHS SURGERY     right hand    NASAL SINUS SURGERY  01/2005   SQUAMOUS CELL CARCINOMA EXCISION     "mostly arms;" (12/24/2017)   STRESS CARDIOLITE  11/1999   Normal/ negative   TEAR DUCT PROBING  2005   "? side"   TEE WITHOUT CARDIOVERSION N/A 02/16/2013  Procedure: TRANSESOPHAGEAL ECHOCARDIOGRAM (TEE);  Surgeon: Josue Hector, MD;  Location: [facility] ENDOSCOPY;  Service: Cardiovascular;  Laterality: N/A;   TUBAL LIGATION     BTL    Allergies  Allergies  Allergen Reactions   Bee Venom Hives, Shortness Of Breath and Swelling   Nabumetone Anaphylaxis   Amoxicillin-Pot Clavulanate Hives and Swelling    To lips.   Aspirin Hives   Atorvastatin Swelling     joint pain/swelling, inc liver tests   Clopidogrel Bisulfate Hives   Codeine Nausea And Vomiting   Ezetimibe Other (See Comments)     fatigue   Metformin And Related Other (See  Comments)    Diarrhea    Valsartan Other (See Comments)     fatigue   Other Hives    **Red Meat**  SOB    History of Present Illness    Margaret Vazquez is an 87 year old female with a history of chronic neutropenia, exercise intolerance, status post ILR placement for cryptogenic stroke, atrial fibrillation on apixaban, carotid artery disease, coronary artery disease, statin intolerance anxiety, and essential hypertension.  Coronary CTA showed potential obstructive disease.-LAD and diagonal area with abnormal FFR.  Coronary CTA there was mention of severe left circumflex disease, however FFR of the circumflex was normal.  She underwent left heart catheterization 12/24/2017 which showed 30% LAD lesion, 80%.  Left circumflex lesion treated with DES, EF 55 to 60%.  Myoview Lexiscan 08/2021 showed no ischemia, normal LV perfusion hyperdynamic LVEF 70%, which was a limited study.  Of note she is allergic to aspirin and clopidogrel and is also allergic to statins and Zetia.  Was evaluated in the emergency department 04/20/2022 for chest pain.  Blood pressure was 162/75, pulse 73 bpm, high-sensitivity troponin negative x 2, EKG shows sinus rhythm with no ischemic changes, she was kept for observation of the discharged home.  She was last seen in clinic 05/19/2022 for hospital follow-up and had not had any other complaints of chest pain or shortness of breath.  No other medication changes were made.  The further testing was ordered.  She returns to clinic today stating that she is feeling well today. Since her initial calls about swelling to the bilateral lower extremities last week has now resolved.  During that time she did some shortness of breath that is also resolved.  She denied any chest pain, palpitations, or lightheadedness/dizziness.  She did state that she had noticed that her blood pressure was lower and was advised to stop taking her amlodipine.  Several days after that her blood pressure was  extremely high so she had restarted taking her amlodipine 2.5 mg daily approximately 3 days ago.  Since that time she has not had recurrent symptoms.  Today on exam she denies chest pain, shortness of breath, dyspnea on exertion, peripheral edema, palpitations, lightheaded or dizziness.  She also denies any recent hospitalizations or visits to the emergency department.  Home Medications    Current Outpatient Medications  Medication Sig Dispense Refill   amLODipine (NORVASC) 2.5 MG tablet Take 1 tablet (2.5 mg) by mouth once daily 90 tablet 3   apixaban (ELIQUIS) 5 MG TABS tablet Take 1 tablet (5 mg total) by mouth 2 (two) times daily. 60 tablet 2   blood glucose meter kit and supplies Dispense based on patient and insurance preference. Use up to four times daily as directed. (FOR ICD-10 E10.9, E11.9). 1 each 0   Blood Glucose Monitoring Suppl (ONE TOUCH ULTRA 2) w/Device KIT Use to  check blood sugar once daily 1 each 0   Cyanocobalamin (VITAMIN B-12 PO) Take 1 tablet by mouth daily.     diphenhydrAMINE (BENADRYL) 25 MG tablet Take 50 mg by mouth every 8 (eight) hours as needed for allergies.      EPINEPHrine (EPI-PEN) 0.3 mg/0.3 mL DEVI Inject 0.3 mg into the muscle daily as needed (allergic reaction).     fluticasone (FLONASE) 50 MCG/ACT nasal spray Place 2 sprays into both nostrils 2 (two) times daily as needed. Reported on 09/10/2015     glipiZIDE (GLUCOTROL XL) 10 MG 24 hr tablet Take 1 tablet (10 mg total) by mouth daily with breakfast. 1 tablet 0   glucose blood (ONETOUCH ULTRA) test strip USE TO CHECK BLOOD SUGAR ONCE DAILY AS DIRECTED 100 strip 1   isosorbide mononitrate (IMDUR) 60 MG 24 hr tablet TAKE 1 TABLET(60 MG) BY MOUTH DAILY 90 tablet 3   metFORMIN (GLUCOPHAGE-XR) 500 MG 24 hr tablet TAKE 1 TABLET(500 MG) BY MOUTH DAILY WITH BREAKFAST 90 tablet 3   nitroGLYCERIN (NITROSTAT) 0.4 MG SL tablet Place 1 tablet (0.4 mg total) under the tongue every 5 (five) minutes as needed for chest pain  (up to 3 doses. If taking 3rd dose call 911). 25 tablet 3   potassium chloride SA (KLOR-CON M) 20 MEQ tablet Take 1 tablet (20 mEq total) by mouth daily. 90 tablet 3   propranolol (INDERAL) 10 MG tablet Take 1 tablet (10 mg) by mouth every 1 hour x 3 doses as needed for chest pain/ fast heart rates 30 tablet 1   rosuvastatin (CRESTOR) 5 MG tablet Take 1 tablet (5 mg) by mouth twice a week 30 tablet 1   Current Facility-Administered Medications  Medication Dose Route Frequency Provider Last Rate Last Admin   Study - ORION 4 - inclisiran 300 mg/1.25m or placebo SQ injection ([facility])  300 mg Subcutaneous Q6 months SHillary Bow MD         Family History    Family History  Problem Relation Age of Onset   Lung cancer Brother    Diabetes Brother    Pancreatic cancer Brother    Heart disease Mother    Heart disease Father    Brain cancer Other    Skin cancer Daughter    Diabetes Sister    Breast cancer Sister    Colon cancer Neg Hx    She indicated that her mother is deceased. She indicated that her father is deceased. She reported the following about one of her sisters: 5 back surgeries. She indicated that both of her brothers are deceased. She indicated that her maternal grandmother is deceased. She indicated that her maternal grandfather is deceased. She indicated that her paternal grandmother is deceased. She indicated that her paternal grandfather is deceased. She indicated that the status of her daughter is unknown. She indicated that the status of her neg hx is unknown. She indicated that her other is alive.  Social History    Social History   Socioeconomic History   Marital status: Married    Spouse name: PEddie Dibbles  Number of children: 3   Years of education: 12   Highest education level: Not on file  Occupational History   Occupation: Retired    [employer] farm manager RETIRED  Tobacco Use   Smoking status: Never   Smokeless tobacco: Never  Vaping Use   Vaping Use: Never used   Substance and Sexual Activity   Alcohol use: Never    Alcohol/week: 0.0 standard  drinks of alcohol   Drug use: Never   Sexual activity: Not Currently  Other Topics Concern   Not on file  Social History Narrative   3 children   Does not drink caffeinated beverages   Cares for SIL with dementia   Social Determinants of Health   Financial Resource Strain: Not on file  Food Insecurity: No Food Insecurity (04/01/2021)   Hunger Vital Sign    Worried About Running Out of Food in the Last Year: Never true    Ran Out of Food in the Last Year: Never true  Transportation Needs: No Transportation Needs (04/01/2021)   PRAPARE - Hydrologist (Medical): No    Lack of Transportation (Non-Medical): No  Physical Activity: Not on file  Stress: Not on file  Social Connections: Not on file  Intimate Partner Violence: Not on file     Review of Systems    General:  No chills, fever, night sweats or weight changes. Endorses fatigue Cardiovascular:  No chest pain, endorses occasional dyspnea on exertion, endorses one episode of peripheral edema, orthopnea, palpitations, paroxysmal nocturnal dyspnea. Dermatological: No rash, lesions/masses Respiratory: No cough, endorses occasional dyspnea Urologic: No hematuria, dysuria Abdominal:   No nausea, vomiting, diarrhea, bright red blood per rectum, melena, or hematemesis Neurologic:  No visual changes, wkns, changes in mental status. All other systems reviewed and are otherwise negative except as noted above.   Physical Exam    VS:  BP (!) 146/62 (BP Location: Left Arm, Patient Position: Sitting, Cuff Size: Normal)   Pulse 60   Ht _0  (1.727 m)   Wt 191 lb 6.4 oz (86.8 kg)   SpO2 96%   BMI 29.10 kg/m  , BMI Body mass index is 29.1 kg/m.     Vitals:   07/08/22 1059 07/08/22 1105  BP: (!) 146/62 (!) 150/72   GEN: Well nourished, well developed, in no acute distress. HEENT: normal. Neck: Supple, no JVD, bilateral  carotid bruits L>R, without masses. Cardiac: RRR, no murmurs, rubs, or gallops. No clubbing, cyanosis, edema.  Radials 2+/PT 2+ and equal bilaterally.  Respiratory:  Respirations regular and unlabored, clear to auscultation bilaterally. GI: Soft, nontender, nondistended, BS + x 4. MS: no deformity or atrophy. Skin: warm and dry, no rash. Neuro:  Strength and sensation are intact. Psych: Normal affect.  Accessory Clinical Findings    ECG personally reviewed by me today-sinus rhythm with sinus arrhythmia with a rate of 60- No acute changes  Lab Results  Component Value Date   WBC 6.3 07/08/2022   HGB 13.8 07/08/2022   HCT 42.3 07/08/2022   MCV 95.7 07/08/2022   PLT 209 07/08/2022   Lab Results  Component Value Date   CREATININE 0.94 07/08/2022   BUN 16 07/08/2022   NA 142 07/08/2022   K 4.2 07/08/2022   CL 106 07/08/2022   CO2 27 07/08/2022   Lab Results  Component Value Date   ALT 10 10/31/2021   AST 14 10/31/2021   ALKPHOS 67 10/31/2021   BILITOT 0.7 10/31/2021   Lab Results  Component Value Date   CHOL 186 10/31/2021   HDL 51.20 10/31/2021   LDLCALC 110 (H) 10/31/2021   LDLDIRECT 119.0 05/01/2021   TRIG 124.0 10/31/2021   CHOLHDL 4 10/31/2021    Lab Results  Component Value Date   HGBA1C 6.2 (A) 04/29/2022    Assessment & Plan   1.Shortness of breath and peripheral edema last week, she has been  scheduled for an echocardiogram.  Previous study was done 04/2020 which revealed an LVEF of 65 to 70%, no regional wall motion abnormalities, mild left ventricular hypertrophy, G1 DD, trivial mitral valve regurgitation and mild aortic valve sclerosis without stenosis.  She is also being sent for blood work of CBC, bmp, bnp.  2.  Coronary artery disease with stable angina.  She continues to deny chest pain today.  No ischemic changes noted on EKG.  Myoview Lexiscan in 08/2021 showed normal LV perfusion, no ischemia, and overall low risk.  She is continued on Norvasc 2.5 mg  daily for antianginal regimen, Imdur 60 mg daily, apixaban and lieu of aspirin, and rosuvastatin 5 mg twice weekly.  She is not currently on any beta-blocker therapy history of sinus bradycardia with chronotropic incompetence.  3.  Paroxysmal atrial fibrillation but is currently in sinus rhythm today.  She is continued on apixaban 5 mg twice daily for CHA2DS2-VASc of at least 6.  She is not on any rate lowering medications due to previous history of sinus bradycardia and is previously followed by a EP for potential pacemaker insertion.  4.  Hypertension with blood pressure today 146/62.  She is continued on Norvasc 2.5 mg daily 60 mg daily and as needed propranolol.  Her blood pressure is slightly elevated today.  She has been off of her amlodipine which has been impacting for 3 doses.  Previously she does have resolved blood pressure is tolerating.  She has been encouraged to continue to monitor her pressures at home.  5.  Mixed hyperlipidemia with an LDL of 10/31/21.  She is continued on rosuvastatin 5 mg twice weekly as she previously was intolerant to statins and a documented  allergy to ezetimibe and atorvastatin.  Repeat lipid panel has been ordered since she states there has been no labs checked since she has been taking her statin twice weekly.  6.  Disposition patient return to clinic to see MD/APP once echocardiogram is completed or sooner if needed.  Tacora Athanas, NP 07/08/2022, 3:22 PM

## 2022-07-08 NOTE — Patient Instructions (Signed)
Medication Instructions:   Your physician recommends that you continue on your current medications as directed. Please refer to the Current Medication list given to you today.  *If you need a refill on your cardiac medications before your next appointment, please call your pharmacy*   Lab Work:  Your physician recommends that you have lab work today at [facility]. Please go to registration desk and check in. They will direct you to the lab.  CBC, BNP, BMP  If you have labs (blood work) drawn today and your tests are completely normal, you will receive your results only by: MyChart Message (if you have MyChart) OR A paper copy in the mail If you have any lab test that is abnormal or we need to change your treatment, we will call you to review the results.   Testing/Procedures:  Your physician has requested that you have an echocardiogram. Echocardiography is a painless test that uses sound waves to create images of your heart. It provides your doctor with information about the size and shape of your heart and how well your heart's chambers and valves are working. This procedure takes approximately one hour. There are no restrictions for this procedure. Please do NOT wear cologne, perfume, aftershave, or lotions (deodorant is allowed). Please arrive 15 minutes prior to your appointment time.    Follow-Up: At [facility], you and your health needs are our priority.  As part of our continuing mission to provide you with exceptional heart care, we have created designated Provider Care Teams.  These Care Teams include your primary Cardiologist (physician) and Advanced Practice Providers (APPs -  Physician Assistants and Nurse Practitioners) who all work together to provide you with the care you need, when you need it.  We recommend signing up for the patient portal called "MyChart".  Sign up information is provided on this After Visit Summary.  MyChart is used to connect with  patients for Virtual Visits (Telemedicine).  Patients are able to view lab/test results, encounter notes, upcoming appointments, etc.  Non-urgent messages can be sent to your provider as well.   To learn more about what you can do with MyChart, go to [email or web address].    Your next appointment:    Follow up after your echo.  The format for your next appointment:   In Person  Provider:   You may see Virl Axe, MD or one of the following Advanced Practice Providers on your designated Care Team:   Murray Hodgkins, NP Christell Faith, PA-C Cadence Kathlen Mody, PA-C Gerrie Nordmann, NP   Important Information About Sugar

## 2022-07-14 ENCOUNTER — Ambulatory Visit: Payer: HMO | Attending: Cardiology

## 2022-07-14 DIAGNOSIS — R0602 Shortness of breath: Secondary | ICD-10-CM

## 2022-07-14 LAB — ECHOCARDIOGRAM COMPLETE
AR max vel: 1.86 cm2
AV Area VTI: 2 cm2
AV Area mean vel: 1.94 cm2
AV Mean grad: 5 mmHg
AV Peak grad: 8.4 mmHg
Ao pk vel: 1.45 m/s
Area-P 1/2: 1.82 cm2
S' Lateral: 2.6 cm

## 2022-07-22 DIAGNOSIS — H26491 Other secondary cataract, right eye: Secondary | ICD-10-CM | POA: Diagnosis not present

## 2022-07-23 DIAGNOSIS — L905 Scar conditions and fibrosis of skin: Secondary | ICD-10-CM | POA: Diagnosis not present

## 2022-07-23 DIAGNOSIS — L578 Other skin changes due to chronic exposure to nonionizing radiation: Secondary | ICD-10-CM | POA: Diagnosis not present

## 2022-07-23 DIAGNOSIS — L570 Actinic keratosis: Secondary | ICD-10-CM | POA: Diagnosis not present

## 2022-07-24 ENCOUNTER — Other Ambulatory Visit: Payer: Self-pay | Admitting: Internal Medicine

## 2022-07-24 ENCOUNTER — Encounter: Payer: Self-pay | Admitting: *Deleted

## 2022-07-24 DIAGNOSIS — Z006 Encounter for examination for normal comparison and control in clinical research program: Secondary | ICD-10-CM

## 2022-07-25 MED ORDER — ROSUVASTATIN CALCIUM 5 MG PO TABS: ORAL_TABLET | ORAL | 1 refills | Status: DC

## 2022-07-25 MED ORDER — ISOSORBIDE MONONITRATE ER 60 MG PO TB24: ORAL_TABLET | ORAL | 3 refills | Status: DC

## 2022-07-25 NOTE — Addendum Note (Signed)
Addended by: Nestor Ramp on: 07/25/2022 10:49 AM   Modules accepted: Orders

## 2022-07-27 NOTE — Progress Notes (Signed)
Cardiology Clinic Note   Patient Name: Margaret Vazquez Date of Encounter: 07/28/2022  Primary Care Provider:  Abner Greenspan, MD Primary Cardiologist:  Margaret Axe, MD  Patient Profile    87 year old female with a history of chronotropic competence, exercise, status post ILR placement for cryptogenic stroke, atrial fibrillation on chronic anticoagulation with apixaban, carotid artery disease, coronary artery disease, intolerant to statins and Zetia, and hypertension, who presents today for follow-up.  Past Medical History    Past Medical History:  Diagnosis Date   Allergy history, drug    Aspirin   Basal cell carcinoma    "back"   CAD (coronary artery disease)    a. Previously nonobstructive then progressive angina with abnl CT -> cardiac cath 12/24/17 showed 30% mid RCA and 80% prox-mid Cx. She received DES to mid AV groove Cx. EF 55-65%.    Cervical stenosis of spine    With neck pain   CKD (chronic kidney disease), stage II    Colon polyps 6294   Complication of anesthesia 1980s   slow to wake after anesthesia "when I had breast biopsy"   GERD (gastroesophageal reflux disease)    Hyperlipidemia    myalgias with Lipitor and Zetia   Hypertension    Migraine    "stopped at age 20" (12/24/2017)   Squamous carcinoma    "iced off and cut off; mostly arms" (12/24/2017)   Stroke [facility])    "told me I'd had 2 strokes in 02/2013"; denies residual on 12/24/2017   TIA (transient ischemic attack)    Type II diabetes mellitus ([place])    Past Surgical History:  Procedure Laterality Date   ABD [place]  07/2003   Negative   APPENDECTOMY     BASAL CELL CARCINOMA EXCISION     "back"   BREAST BIOPSY Left 1992   benign   CARDIAC CATHETERIZATION  04/07/2011   non obst CAD (Dr Margaret Vazquez)   CATARACT EXTRACTION W/PHACO Left 04/01/2017   Procedure: CATARACT EXTRACTION PHACO AND INTRAOCULAR LENS PLACEMENT ([place]) LEFT DIABETIC;  Surgeon: Margaret Koyanagi, MD;  Location: [place];   Service: Ophthalmology;  Laterality: Left;  Diabetic - oral meds   CATARACT EXTRACTION W/PHACO Right 04/29/2017   Procedure: CATARACT EXTRACTION PHACO AND INTRAOCULAR LENS PLACEMENT ([place]) RIGHT DIABETIC;  Surgeon: Margaret Koyanagi, MD;  Location: [place];  Service: Ophthalmology;  Laterality: Right;  Diabetic - oral meds   COLONOSCOPY  12/2007   Adenomatous colon polyps   CORONARY ANGIOPLASTY WITH STENT PLACEMENT  12/24/2017   CORONARY STENT INTERVENTION N/A 12/24/2017   Procedure: CORONARY STENT INTERVENTION;  Surgeon: Margaret Harp, MD;  Location: [place] CV LAB;  Service: Cardiovascular;  Laterality: N/A;   CYSTOSCOPY W/ DECANNULATION  03/2000   Normal   LEFT HEART CATH AND CORONARY ANGIOGRAPHY N/A 12/24/2017   Procedure: LEFT HEART CATH AND CORONARY ANGIOGRAPHY;  Surgeon: Margaret Harp, MD;  Location: [place] CV LAB;  Service: Cardiovascular;  Laterality: N/A;   LOOP RECORDER IMPLANT N/A 02/16/2013   Procedure: LOOP RECORDER IMPLANT;  Surgeon: Margaret Sprang, MD;  Location: [facility] CATH LAB;  Service: Cardiovascular;  Laterality: N/A;   MOHS SURGERY     right hand    NASAL SINUS SURGERY  01/2005   SQUAMOUS CELL CARCINOMA EXCISION     "mostly arms;" (12/24/2017)   STRESS CARDIOLITE  11/1999   Normal/ negative   TEAR DUCT PROBING  2005   "? side"   TEE WITHOUT CARDIOVERSION  N/A 02/16/2013   Procedure: TRANSESOPHAGEAL ECHOCARDIOGRAM (TEE);  Surgeon: Margaret Hector, MD;  Location: [facility] ENDOSCOPY;  Service: Cardiovascular;  Laterality: N/A;   TUBAL LIGATION     BTL    Allergies  Allergies  Allergen Reactions   Bee Venom Hives, Shortness Of Breath and Swelling   Nabumetone Anaphylaxis   Amoxicillin-Pot Clavulanate Hives and Swelling    To lips.   Aspirin Hives   Atorvastatin Swelling     joint pain/swelling, inc liver tests   Clopidogrel Bisulfate Hives   Codeine Nausea And Vomiting   Ezetimibe Other (See Comments)     fatigue   Metformin And Related  Other (See Comments)    Diarrhea    Valsartan Other (See Comments)     fatigue   Other Hives    **Red Meat**  SOB    History of Present Illness    Margaret Vazquez is a 65-year-old female with previously mentioned past medical history.  She underwent coronary CTA that showed potential obstructive disease in the LAD and diagonal area with an abnormal FFR.  Coronary CTA there was mention of severe left circumflex disease, however FFR of the circumflex was normal.  She underwent left heart catheterization on 12/24/2017 which showed 30% LAD lesion 80% left circumflex and was treated with DES, EF 55 to 60%.  Myoview Lexiscan in 08/2021 showed no ischemia, normal LV perfusion, hyperdynamic LVEF 70%.  She was last seen in clinic 07/08/2022 where she had some complaints of swelling to her bilateral lower extremities that had resolved prior to her appointment and some worsening shortness of breath is also resolved.  She had previously had complaints of lightheadedness and dizziness.  Her amlodipine 2.5 mg daily but noticed elevated blood pressures and had restarted taking her amlodipine without any symptoms.  At that time she was scheduled for an echocardiogram and sent for blood work of CBC/BMP/BMP.  Echocardiogram completed 07/14/2022 revealed LVEF of 60 to 65%, no regional wall motion abnormalities, G1 DD, without valvular abnormalities. CBC and BMP unremarkable and mildly increased BNP.   She returns to clinic today stating that she has been doing well.  She denies any shortness of breath or worsening peripheral edema.  She states that she did for the insurance nurses call her daughter to order medication to be causing the peripheral edema that she had which was likely her amlodipine.  She denies any chest pain, palpitations, lightheadedness/dizziness.  She is continue to take her amlodipine and her blood pressure has been much better controlled.  She also denies any hospitalizations or visits to the emergency  department.  She states that she does get a telephone call recently from the research department and needing an updated lipid panel.  Home Medications    Current Outpatient Medications  Medication Sig Dispense Refill   amLODipine (NORVASC) 2.5 MG tablet Take 1 tablet (2.5 mg) by mouth once daily 90 tablet 3   apixaban (ELIQUIS) 5 MG TABS tablet Take 1 tablet (5 mg total) by mouth 2 (two) times daily. 60 tablet 2   blood glucose meter kit and supplies Dispense based on patient and insurance preference. Use up to four times daily as directed. (FOR ICD-10 E10.9, E11.9). 1 each 0   Blood Glucose Monitoring Suppl (ONE TOUCH ULTRA 2) w/Device KIT Use to check blood sugar once daily 1 each 0   Cyanocobalamin (VITAMIN B-12 PO) Take 1 tablet by mouth daily.     diphenhydrAMINE (BENADRYL) 25 MG tablet Take 50 mg  by mouth every 8 (eight) hours as needed for allergies.      EPINEPHrine (EPI-PEN) 0.3 mg/0.3 mL DEVI Inject 0.3 mg into the muscle daily as needed (allergic reaction).     fluticasone (FLONASE) 50 MCG/ACT nasal spray Place 2 sprays into both nostrils 2 (two) times daily as needed. Reported on 09/10/2015     glipiZIDE (GLUCOTROL XL) 10 MG 24 hr tablet Take 1 tablet (10 mg total) by mouth daily with breakfast. 1 tablet 0   glucose blood (ONETOUCH ULTRA) test strip USE TO CHECK BLOOD SUGAR ONCE DAILY AS DIRECTED 100 strip 1   isosorbide mononitrate (IMDUR) 60 MG 24 hr tablet TAKE 1 TABLET(60 MG) BY MOUTH DAILY 90 tablet 3   metFORMIN (GLUCOPHAGE-XR) 500 MG 24 hr tablet TAKE 1 TABLET(500 MG) BY MOUTH DAILY WITH BREAKFAST 90 tablet 3   nitroGLYCERIN (NITROSTAT) 0.4 MG SL tablet Place 1 tablet (0.4 mg total) under the tongue every 5 (five) minutes as needed for chest pain (up to 3 doses. If taking 3rd dose call 911). 25 tablet 3   potassium chloride SA (KLOR-CON M) 20 MEQ tablet Take 1 tablet (20 mEq total) by mouth daily. 90 tablet 3   propranolol (INDERAL) 10 MG tablet Take 1 tablet (10 mg) by mouth  every 1 hour x 3 doses as needed for chest pain/ fast heart rates 30 tablet 1   rosuvastatin (CRESTOR) 5 MG tablet TAKE 1 TABLET BY MOUTH TWICE A WEEK 30 tablet 1   Current Facility-Administered Medications  Medication Dose Route Frequency Provider Last Rate Last Admin   Study - ORION 4 - inclisiran 300 mg/1.58m or placebo SQ injection ([facility])  300 mg Subcutaneous Q6 months SHillary Bow MD         Family History    Family History  Problem Relation Age of Onset   Lung cancer Brother    Diabetes Brother    Pancreatic cancer Brother    Heart disease Mother    Heart disease Father    Brain cancer Other    Skin cancer Daughter    Diabetes Sister    Breast cancer Sister    Colon cancer Neg Hx    She indicated that her mother is deceased. She indicated that her father is deceased. She reported the following about one of her sisters: 5 back surgeries. She indicated that both of her brothers are deceased. She indicated that her maternal grandmother is deceased. She indicated that her maternal grandfather is deceased. She indicated that her paternal grandmother is deceased. She indicated that her paternal grandfather is deceased. She indicated that the status of her daughter is unknown. She indicated that the status of her neg hx is unknown. She indicated that her other is alive.  Social History    Social History   Socioeconomic History   Marital status: Married    Spouse name: PEddie Dibbles  Number of children: 3   Years of education: 12   Highest education level: Not on file  Occupational History   Occupation: Retired    [employer] farm manager RETIRED  Tobacco Use   Smoking status: Never   Smokeless tobacco: Never  Vaping Use   Vaping Use: Never used  Substance and Sexual Activity   Alcohol use: Never    Alcohol/week: 0.0 standard drinks of alcohol   Drug use: Never   Sexual activity: Not Currently  Other Topics Concern   Not on file  Social History Narrative   3 children   Does not  drink caffeinated beverages   Cares for SIL with dementia   Social Determinants of Health   Financial Resource Strain: Not on file  Food Insecurity: No Food Insecurity (04/01/2021)   Hunger Vital Sign    Worried About Running Out of Food in the Last Year: Never true    Ran Out of Food in the Last Year: Never true  Transportation Needs: No Transportation Needs (04/01/2021)   PRAPARE - Hydrologist (Medical): No    Lack of Transportation (Non-Medical): No  Physical Activity: Not on file  Stress: Not on file  Social Connections: Not on file  Intimate Partner Violence: Not on file     Review of Systems    General:  No chills, fever, night sweats or weight changes.  Endorses fatigue Cardiovascular:  No chest pain, endorses occasional exertional dyspnea on exertion, endorses occasional peripheral edema, but denies orthopnea, palpitations, paroxysmal nocturnal dyspnea. Dermatological: No rash, lesions/masses Respiratory: No cough, endorses occasional exertional dyspnea Urologic: No hematuria, dysuria Abdominal:   No nausea, vomiting, diarrhea, bright red blood per rectum, melena, or hematemesis Neurologic:  No visual changes, wkns, changes in mental status. All other systems reviewed and are otherwise negative except as noted above.   Physical Exam    VS:  BP (!) 118/54 (BP Location: Left Arm, Patient Position: Sitting, Cuff Size: Normal)   Pulse (!) 58   Ht '5\' 8"'$  (1.727 m)   Wt 192 lb 12.8 oz (87.5 kg)   SpO2 96%   BMI 29.32 kg/m  , BMI Body mass index is 29.32 kg/m.     GEN: Well nourished, well developed, in no acute distress. HEENT: normal. Neck: Supple, no JVD, carotid bruits, or masses. Cardiac: RRR, no murmurs, rubs, or gallops. No clubbing, cyanosis, edema.  Radials 2+/PT 2+ and equal bilaterally.  Respiratory:  Respirations regular and unlabored, clear to auscultation bilaterally. GI: Soft, nontender, nondistended, BS + x 4. MS: no deformity  or atrophy. Skin: warm and dry, no rash. Neuro:  Strength and sensation are intact. Psych: Normal affect.  Accessory Clinical Findings    ECG personally reviewed by me today-sinus bradycardia with a rate of 58- No acute changes  Lab Results  Component Value Date   WBC 6.3 07/08/2022   HGB 13.8 07/08/2022   HCT 42.3 07/08/2022   MCV 95.7 07/08/2022   PLT 209 07/08/2022   Lab Results  Component Value Date   CREATININE 0.94 07/08/2022   BUN 16 07/08/2022   NA 142 07/08/2022   K 4.2 07/08/2022   CL 106 07/08/2022   CO2 27 07/08/2022   Lab Results  Component Value Date   ALT 10 10/31/2021   AST 14 10/31/2021   ALKPHOS 67 10/31/2021   BILITOT 0.7 10/31/2021   Lab Results  Component Value Date   CHOL 215 (H) 07/28/2022   HDL 53 07/28/2022   LDLCALC 127 (H) 07/28/2022   LDLDIRECT 119.0 05/01/2021   TRIG 173 (H) 07/28/2022   CHOLHDL 4.1 07/28/2022    Lab Results  Component Value Date   HGBA1C 6.2 (A) 04/29/2022    Assessment & Plan   1.  Coronary artery disease with stable angina.  She continues to deny chest pain or anginal equivalents today.  No ischemic changes noted on EKG.  Myoview Lexiscan in 08/2021 showed normal LV perfusion, no ischemia, low risk.  She is continued on amlodipine 2.5 mg daily for antianginal regimen, Imdur 60 mg daily, apixaban 5 mg twice daily  and lieu of aspirin.  She is currently not on any beta-blocker therapy due to history of sinus bradycardia with chronotropic incompetence.  2.  Paroxysmal atrial fibrillation but currently sinus bradycardia.  She is continued on apixaban 5 mg twice daily for CHA2DS2-VASc of at least 6.  She is not on any rate lowering medications due to previous history of sinus bradycardia and is followed by EP.  3.  Hypertension: Blood pressure today 119/54.  She is continued on amlodipine 2.5 mg daily and Imdur 60 mg daily as well as and as needed propranolol.  Blood pressure has improved since restarting her amlodipine.   She is encouraged to continue to monitor her pressures at home.  4.  Mixed hyperlipidemia with last LDL 10/31/2021.  She is continued on rosuvastatin 5 mg twice weekly as she has been intolerant to statin and a documented allergy to ezetimibe and atorvastatin.  Lipid panel ordered today.  5.  Resolved shortness of breath with an echocardiogram completed with LVEF of 60 to 65%, no regional wall motion abnormalities, G1 DD, no valvular abnormalities noted.  Blood work was also unremarkable.  6.  Disposition patient return to clinic to see MD/APP in 3 months or sooner if needed  Tomie Spizzirri, NP 07/28/2022, 1:07 PM

## 2022-07-28 ENCOUNTER — Encounter: Payer: Self-pay | Admitting: Cardiology

## 2022-07-28 ENCOUNTER — Other Ambulatory Visit
Admission: RE | Admit: 2022-07-28 | Discharge: 2022-07-28 | Disposition: A | Discharge status: Home / Self Care | Payer: PPO | Attending: Cardiology | Admitting: Cardiology

## 2022-07-28 ENCOUNTER — Ambulatory Visit: Payer: PPO | Attending: Cardiology | Admitting: Cardiology

## 2022-07-28 VITALS — BP 118/54 | HR 58 | Ht 68.0 in | Wt 192.8 lb

## 2022-07-28 DIAGNOSIS — I2510 Atherosclerotic heart disease of native coronary artery without angina pectoris: Secondary | ICD-10-CM | POA: Diagnosis not present

## 2022-07-28 DIAGNOSIS — Z79899 Other long term (current) drug therapy: Secondary | ICD-10-CM | POA: Insufficient documentation

## 2022-07-28 DIAGNOSIS — E782 Mixed hyperlipidemia: Secondary | ICD-10-CM | POA: Diagnosis not present

## 2022-07-28 DIAGNOSIS — E785 Hyperlipidemia, unspecified: Secondary | ICD-10-CM | POA: Insufficient documentation

## 2022-07-28 DIAGNOSIS — I480 Paroxysmal atrial fibrillation: Secondary | ICD-10-CM | POA: Diagnosis not present

## 2022-07-28 LAB — LIPID PANEL
Cholesterol: 215 mg/dL — ABNORMAL HIGH (ref 0–200)
HDL: 53 mg/dL (ref >40)
LDL Cholesterol: 127 mg/dL — ABNORMAL HIGH (ref 0–99)
Total CHOL/HDL Ratio: 4.1 RATIO
Triglycerides: 173 mg/dL — ABNORMAL HIGH (ref <150)
VLDL: 35 mg/dL (ref 0–40)

## 2022-07-28 NOTE — Patient Instructions (Signed)
Medication Instructions:  No changes at this time.   *If you need a refill on your cardiac medications before your next appointment, please call your pharmacy*   Lab Work: Lipid panel over at the [facility]. Just stop at registration desk to get checked in.   If you have labs (blood work) drawn today and your tests are completely normal, you will receive your results only by: [place] (if you have MyChart) OR A paper copy in the mail If you have any lab test that is abnormal or we need to change your treatment, we will call you to review the results.   Testing/Procedures: None   Follow-Up: At [facility], you and your health needs are our priority.  As part of our continuing mission to provide you with exceptional heart care, we have created designated Provider Care Teams.  These Care Teams include your primary Cardiologist (physician) and Advanced Practice Providers (APPs -  Physician Assistants and Nurse Practitioners) who all work together to provide you with the care you need, when you need it.   Your next appointment:   3 month(s)  Provider:   Virl Axe, MD or Gerrie Nordmann, NP

## 2022-07-29 NOTE — Progress Notes (Signed)
LDL has increased over the last 9 months. Recommend increasing rosuvastatin to 3 days weekly, if it can be tolerated, and repeat lipid and hepatic panel in 3 months

## 2022-08-12 ENCOUNTER — Telehealth: Payer: Self-pay | Admitting: *Deleted

## 2022-08-12 NOTE — Telephone Encounter (Signed)
Left voicemail message to call back for review of results and recommendations.     Janan Ridge, [place] 07/31/2022  2:47 PM EST Back to Top    Called patient to review lab results. Husband answered the phone and said she was available and to call back later.

## 2022-08-12 NOTE — Telephone Encounter (Signed)
-----   Message from Gerrie Nordmann, NP sent at 07/29/2022  5:25 PM EST ----- LDL has increased over the last 9 months. Recommend increasing rosuvastatin to 3 days weekly, if it can be tolerated, and repeat lipid and hepatic panel in 3 months

## 2022-08-15 ENCOUNTER — Other Ambulatory Visit: Payer: Self-pay | Admitting: *Deleted

## 2022-08-15 ENCOUNTER — Telehealth: Payer: Self-pay | Admitting: Internal Medicine

## 2022-08-15 DIAGNOSIS — E782 Mixed hyperlipidemia: Secondary | ICD-10-CM

## 2022-08-15 DIAGNOSIS — I2510 Atherosclerotic heart disease of native coronary artery without angina pectoris: Secondary | ICD-10-CM

## 2022-08-15 DIAGNOSIS — E785 Hyperlipidemia, unspecified: Secondary | ICD-10-CM

## 2022-08-15 DIAGNOSIS — Z79899 Other long term (current) drug therapy: Secondary | ICD-10-CM

## 2022-08-15 MED ORDER — ROSUVASTATIN CALCIUM 5 MG PO TABS: 5.0000 mg | ORAL_TABLET | ORAL | 3 refills | Status: DC

## 2022-08-15 NOTE — Telephone Encounter (Signed)
Error with prescription. Updated prescription and sent it electronically to the pharmacy. Advised pharmacist that it had been sent and there was no further needs.

## 2022-08-15 NOTE — Telephone Encounter (Signed)
Pharmacy is calling to receive a verbal for medication

## 2022-08-19 ENCOUNTER — Ambulatory Visit: Payer: HMO | Admitting: Medical

## 2022-08-19 DIAGNOSIS — H26492 Other secondary cataract, left eye: Secondary | ICD-10-CM | POA: Diagnosis not present

## 2022-09-02 NOTE — Telephone Encounter (Signed)
Left voicemail message to call back  

## 2022-09-03 NOTE — Telephone Encounter (Signed)
Left voicemail message to call back  

## 2022-09-08 DIAGNOSIS — I10 Essential (primary) hypertension: Secondary | ICD-10-CM | POA: Diagnosis not present

## 2022-09-08 DIAGNOSIS — Z7984 Long term (current) use of oral hypoglycemic drugs: Secondary | ICD-10-CM | POA: Diagnosis not present

## 2022-09-08 DIAGNOSIS — E785 Hyperlipidemia, unspecified: Secondary | ICD-10-CM | POA: Diagnosis not present

## 2022-09-08 DIAGNOSIS — I480 Paroxysmal atrial fibrillation: Secondary | ICD-10-CM | POA: Diagnosis not present

## 2022-09-08 DIAGNOSIS — E1169 Type 2 diabetes mellitus with other specified complication: Secondary | ICD-10-CM | POA: Diagnosis not present

## 2022-09-08 DIAGNOSIS — E1159 Type 2 diabetes mellitus with other circulatory complications: Secondary | ICD-10-CM | POA: Diagnosis not present

## 2022-09-08 DIAGNOSIS — Z7901 Long term (current) use of anticoagulants: Secondary | ICD-10-CM | POA: Diagnosis not present

## 2022-09-09 DIAGNOSIS — Z961 Presence of intraocular lens: Secondary | ICD-10-CM | POA: Diagnosis not present

## 2022-09-09 NOTE — Telephone Encounter (Signed)
No answer/Voicemail box is full.  

## 2022-09-09 NOTE — Telephone Encounter (Signed)
Results have been viewed with patient. See result notes.

## 2022-09-15 NOTE — Research (Signed)
Spoke with patient about study She is ok with being contacted by phone for her follow up visits  At this time she dont want to start on drug will let us know if she changes her mind.

## 2022-09-18 ENCOUNTER — Other Ambulatory Visit: Payer: Self-pay | Admitting: Internal Medicine

## 2022-09-18 DIAGNOSIS — I480 Paroxysmal atrial fibrillation: Secondary | ICD-10-CM

## 2022-09-18 NOTE — Telephone Encounter (Signed)
Prescription refill request for Eliquis received. Indication: Afib  Last office visit: 07/28/22 ([place])  Scr: 0.94 (04/20/22)  Age: 87 Weight: 87.5kg  Appropriate dose. Refill sent.

## 2022-10-13 ENCOUNTER — Ambulatory Visit (INDEPENDENT_AMBULATORY_CARE_PROVIDER_SITE_OTHER): Payer: PPO | Admitting: Family Medicine

## 2022-10-13 ENCOUNTER — Encounter: Payer: Self-pay | Admitting: Family Medicine

## 2022-10-13 VITALS — BP 136/74 | HR 64 | Temp 97.6°F | Ht 68.0 in | Wt 191.5 lb

## 2022-10-13 DIAGNOSIS — N3000 Acute cystitis without hematuria: Secondary | ICD-10-CM | POA: Insufficient documentation

## 2022-10-13 DIAGNOSIS — E1151 Type 2 diabetes mellitus with diabetic peripheral angiopathy without gangrene: Secondary | ICD-10-CM

## 2022-10-13 DIAGNOSIS — E785 Hyperlipidemia, unspecified: Secondary | ICD-10-CM | POA: Diagnosis not present

## 2022-10-13 DIAGNOSIS — R350 Frequency of micturition: Secondary | ICD-10-CM | POA: Diagnosis not present

## 2022-10-13 DIAGNOSIS — E1169 Type 2 diabetes mellitus with other specified complication: Secondary | ICD-10-CM

## 2022-10-13 DIAGNOSIS — I10 Essential (primary) hypertension: Secondary | ICD-10-CM | POA: Diagnosis not present

## 2022-10-13 LAB — POC URINALSYSI DIPSTICK (AUTOMATED)
Bilirubin, UA: NEGATIVE
Blood, UA: 50 — AB
Glucose, UA: NEGATIVE
Ketones, UA: NEGATIVE
Nitrite, UA: NEGATIVE
Protein, UA: NEGATIVE
Spec Grav, UA: 1.025 (ref 1.010–1.025)
Urobilinogen, UA: 0.2 E.U./dL
pH, UA: 5.5 (ref 5.0–8.0)

## 2022-10-13 LAB — POCT GLYCOSYLATED HEMOGLOBIN (HGB A1C): Hemoglobin A1C: 6.1 % — AB (ref 4.0–5.6)

## 2022-10-13 MED ORDER — NITROFURANTOIN MONOHYD MACRO 100 MG PO CAPS: 100.0000 mg | ORAL_CAPSULE | Freq: Two times a day (BID) | ORAL | 0 refills | Status: DC

## 2022-10-13 NOTE — Patient Instructions (Addendum)
I think you have a uti  Drink water  Take the generic macrobid as directed We will wait on the urine culture and then update you   Let us know in the meantime if symptoms worsen    Diabetes is stable  Try and stay active  Check out a chair yoga program (on line)   Take care of yourself    Think about wearing support socks to the knee during the day  To help swelling and veins

## 2022-10-13 NOTE — Assessment & Plan Note (Addendum)
bp in fair control at this time  BP Readings from Last 1 Encounters:  10/13/22 136/74  This is usually lower at home and also in cardiology office  Most recent labs reviewed as well as cardiology note Disc lifstyle change with low sodium diet and exercise  Plans to continue Amlodipine 2.5 mg daily  imdur 60 mg daily  Prn propranolol

## 2022-10-13 NOTE — Progress Notes (Signed)
Subjective:    Patient ID: Margaret Vazquez, female    DOB: Sep 12, 1935, 87 y.o.   MRN: [id number]  HPI Pt presents for f/u of DM 2 and HTN and chronic medical problems  Also more severe urinary frequency   Wt Readings from Last 3 Encounters:  10/13/22 191 lb 8 oz (86.9 kg)  07/28/22 192 lb 12.8 oz (87.5 kg)  07/08/22 191 lb 6.4 oz (86.8 kg)   29.12 kg/m  Vitals:   10/13/22 1030  BP: 136/74  Pulse: 64  Temp: 97.6 F (36.4 C)  SpO2: 94%   Doing ok overall  She tries to drink water all day   Had burning to urinate last week  No fever or nausea  No blood in urine  Some bladder pressure   Results for orders placed or performed in visit on 10/13/22  POCT glycosylated hemoglobin (Hb A1C)  Result Value Ref Range   Hemoglobin A1C 6.1 (A) 4.0 - 5.6 %   HbA1c POC (<> result, manual entry)     HbA1c, POC (prediabetic range)     HbA1c, POC (controlled diabetic range)    POCT Urinalysis Dipstick (Automated)  Result Value Ref Range   Color, UA Yellow    Clarity, UA Cloudy    Glucose, UA Negative Negative   Bilirubin, UA Negative    Ketones, UA Negative    Spec Grav, UA 1.025 1.010 - 1.025   Blood, UA 50 Ery/uL (A)    pH, UA 5.5 5.0 - 8.0   Protein, UA Negative Negative   Urobilinogen, UA 0.2 0.2 or 1.0 E.U./dL   Nitrite, UA Negative    Leukocytes, UA Large (3+) (A) Negative      HTN  (in setting of CAD and a fib) Under care of cardiology   bp is stable today  No cp or palpitations or headaches or edema  No side effects to medicines  BP Readings from Last 3 Encounters:  10/13/22 136/74  07/28/22 (!) 118/54  07/08/22 (!) 150/72    Pulse Readings from Last 3 Encounters:  10/13/22 64  07/28/22 (!) 58  07/08/22 60     Amlodipine 2.5 mg daily  Imdur 60 mg daily  Propranolol prn   Lab Results  Component Value Date   CREATININE 0.94 07/08/2022   BUN 16 07/08/2022   NA 142 07/08/2022   K 4.2 07/08/2022   CL 106 07/08/2022   CO2 27 07/08/2022   GFR of  52.52     DM2 Lab Results  Component Value Date   HGBA1C 6.2 (A) 04/29/2022   Today 6.1   Metformin xr 500 mg daily  Glipizide xl 10 mg daily     Hyperlipidemia Lab Results  Component Value Date   CHOL 215 (H) 07/28/2022   HDL 53 07/28/2022   LDLCALC 127 (H) 07/28/2022   LDLDIRECT 119.0 05/01/2021   TRIG 173 (H) 07/28/2022   CHOLHDL 4.1 07/28/2022   Is statin intolerant- able to tolerate only 5 mg of crestor twice weekly = now tolerating it three days weekly   Is in study drug inclisiran  Thinks it causes her legs to swell   Patient Active Problem List   Diagnosis Date Noted   Acute cystitis 10/13/2022   Chest pain 04/20/2022   Type II diabetes mellitus    Estrogen deficiency 11/07/2021   Grief reaction 05/08/2021   Elevated TSH 11/05/2020   Allergy to statin medication 03/26/2020   History of loop recorder 09/06/2019   History  of seizure 03/23/2019   Left-sided epistaxis 11/17/2018   Medicare annual wellness visit, subsequent 09/13/2018   Screening mammogram, encounter for 09/13/2018   Herpes zoster 08/10/2018   Coronary artery disease 12/24/2017   Routine general medical examination at a health care facility 08/24/2016   PAF (paroxysmal atrial fibrillation) 04/07/2016   Overweight (BMI 25.0-29.9) 08/23/2014   Type 2 diabetes mellitus with diabetic peripheral angiopathy without gangrene, without long-term current use of insulin 01/12/2014   History of CVA (cerebrovascular accident) 01/11/2014   LOOP Recorder LINQ 06/07/2013   Sinusitis, chronic 05/16/2013   H/O: CVA (cerebrovascular accident) 02/15/2013   Encounter for Medicare annual wellness exam 10/27/2012   Encopresis(307.7) 12/15/2011   Post-menopausal 10/28/2011   PERSONAL HX COLONIC POLYPS 09/04/2009   B12 deficiency 03/06/2009   ALLERGIC RHINITIS 02/14/2008   BACK PAIN, LUMBAR 11/03/2007   Hyperlipidemia associated with type 2 diabetes mellitus 11/24/2006   Essential hypertension 11/24/2006    GERD 10/09/2006   OVERACTIVE BLADDER 10/09/2006   INCONTINENCE, URGE 10/09/2006   SKIN CANCER, HX OF 10/09/2006   Past Medical History:  Diagnosis Date   Allergy history, drug    Aspirin   Basal cell carcinoma    "back"   CAD (coronary artery disease)    a. Previously nonobstructive then progressive angina with abnl CT -> cardiac cath 12/24/17 showed 30% mid RCA and 80% prox-mid Cx. She received DES to mid AV groove Cx. EF 55-65%.    Cervical stenosis of spine    With neck pain   CKD (chronic kidney disease), stage II    Colon polyps 2009   Complication of anesthesia 1980s   slow to wake after anesthesia "when I had breast biopsy"   GERD (gastroesophageal reflux disease)    Hyperlipidemia    myalgias with Lipitor and Zetia   Hypertension    Migraine    "stopped at age 11" (12/24/2017)   Squamous carcinoma    "iced off and cut off; mostly arms" (12/24/2017)   Stroke    "told me I'd had 2 strokes in 02/2013"; denies residual on 12/24/2017   TIA (transient ischemic attack)    Type II diabetes mellitus    Past Surgical History:  Procedure Laterality Date   ABD [place]  07/2003   Negative   APPENDECTOMY     BASAL CELL CARCINOMA EXCISION     "back"   BREAST BIOPSY Left 1992   benign   CARDIAC CATHETERIZATION  04/07/2011   non obst CAD (Dr Excell Seltzer)   CATARACT EXTRACTION W/PHACO Left 04/01/2017   Procedure: CATARACT EXTRACTION PHACO AND INTRAOCULAR LENS PLACEMENT (IOC) LEFT DIABETIC;  Surgeon: Lockie Mola, MD;  Location: [facility];  Service: Ophthalmology;  Laterality: Left;  Diabetic - oral meds   CATARACT EXTRACTION W/PHACO Right 04/29/2017   Procedure: CATARACT EXTRACTION PHACO AND INTRAOCULAR LENS PLACEMENT (IOC) RIGHT DIABETIC;  Surgeon: Lockie Mola, MD;  Location: [facility];  Service: Ophthalmology;  Laterality: Right;  Diabetic - oral meds   COLONOSCOPY  12/2007   Adenomatous colon polyps   CORONARY ANGIOPLASTY WITH STENT PLACEMENT  12/24/2017    CORONARY STENT INTERVENTION N/A 12/24/2017   Procedure: CORONARY STENT INTERVENTION;  Surgeon: Runell Gess, MD;  Location: [facility] INVASIVE CV LAB;  Service: Cardiovascular;  Laterality: N/A;   CYSTOSCOPY W/ DECANNULATION  03/2000   Normal   LEFT HEART CATH AND CORONARY ANGIOGRAPHY N/A 12/24/2017   Procedure: LEFT HEART CATH AND CORONARY ANGIOGRAPHY;  Surgeon: Runell Gess, MD;  Location: [facility] INVASIVE  CV LAB;  Service: Cardiovascular;  Laterality: N/A;   LOOP RECORDER IMPLANT N/A 02/16/2013   Procedure: LOOP RECORDER IMPLANT;  Surgeon: Duke Salvia, MD;  Location: [facility] CATH LAB;  Service: Cardiovascular;  Laterality: N/A;   MOHS SURGERY     right hand    NASAL SINUS SURGERY  01/2005   SQUAMOUS CELL CARCINOMA EXCISION     "mostly arms;" (12/24/2017)   STRESS CARDIOLITE  11/1999   Normal/ negative   TEAR DUCT PROBING  2005   "? side"   TEE WITHOUT CARDIOVERSION N/A 02/16/2013   Procedure: TRANSESOPHAGEAL ECHOCARDIOGRAM (TEE);  Surgeon: Wendall Stade, MD;  Location: [facility] ENDOSCOPY;  Service: Cardiovascular;  Laterality: N/A;   TUBAL LIGATION     BTL   Social History   Tobacco Use   Smoking status: Never   Smokeless tobacco: Never  Vaping Use   Vaping Use: Never used  Substance Use Topics   Alcohol use: Never    Alcohol/week: 0.0 standard drinks of alcohol   Drug use: Never   Family History  Problem Relation Age of Onset   Lung cancer Brother    Diabetes Brother    Pancreatic cancer Brother    Heart disease Mother    Heart disease Father    Brain cancer Other    Skin cancer Daughter    Diabetes Sister    Breast cancer Sister    Colon cancer Neg Hx    Allergies  Allergen Reactions   Bee Venom Hives, Shortness Of Breath and Swelling   Nabumetone Anaphylaxis   Amoxicillin-Pot Clavulanate Hives and Swelling    To lips.   Aspirin Hives   Atorvastatin Swelling     joint pain/swelling, inc liver tests   Clopidogrel Bisulfate Hives   Codeine Nausea And Vomiting    Ezetimibe Other (See Comments)     fatigue   Metformin And Related Other (See Comments)    Diarrhea    Valsartan Other (See Comments)     fatigue   Jardiance [Empagliflozin]     Did not tolerate   Other Hives    **Red Meat**  SOB   Current Outpatient Medications on File Prior to Visit  Medication Sig Dispense Refill   amLODipine (NORVASC) 2.5 MG tablet Take 1 tablet (2.5 mg) by mouth once daily 90 tablet 3   apixaban (ELIQUIS) 5 MG TABS tablet TAKE 1 TABLET(5 MG) BY MOUTH TWICE DAILY 60 tablet 5   blood glucose meter kit and supplies Dispense based on patient and insurance preference. Use up to four times daily as directed. (FOR ICD-10 E10.9, E11.9). 1 each 0   Blood Glucose Monitoring Suppl (ONE TOUCH ULTRA 2) w/Device KIT Use to check blood sugar once daily 1 each 0   Cyanocobalamin (VITAMIN B-12 PO) Take 1 tablet by mouth daily.     diphenhydrAMINE (BENADRYL) 25 MG tablet Take 50 mg by mouth every 8 (eight) hours as needed for allergies.      EPINEPHrine (EPI-PEN) 0.3 mg/0.3 mL DEVI Inject 0.3 mg into the muscle daily as needed (allergic reaction).     fluticasone (FLONASE) 50 MCG/ACT nasal spray Place 2 sprays into both nostrils 2 (two) times daily as needed. Reported on 09/10/2015     glipiZIDE (GLUCOTROL XL) 10 MG 24 hr tablet Take 1 tablet (10 mg total) by mouth daily with breakfast. 1 tablet 0   glucose blood (ONETOUCH ULTRA) test strip USE TO CHECK BLOOD SUGAR ONCE DAILY AS DIRECTED 100 strip 1   isosorbide  mononitrate (IMDUR) 60 MG 24 hr tablet TAKE 1 TABLET(60 MG) BY MOUTH DAILY 90 tablet 3   metFORMIN (GLUCOPHAGE-XR) 500 MG 24 hr tablet TAKE 1 TABLET(500 MG) BY MOUTH DAILY WITH BREAKFAST 90 tablet 3   nitroGLYCERIN (NITROSTAT) 0.4 MG SL tablet Place 1 tablet (0.4 mg total) under the tongue every 5 (five) minutes as needed for chest pain (up to 3 doses. If taking 3rd dose call 911). 25 tablet 3   potassium chloride SA (KLOR-CON M) 20 MEQ tablet Take 1 tablet (20 mEq total) by mouth  daily. 90 tablet 3   propranolol (INDERAL) 10 MG tablet Take 1 tablet (10 mg) by mouth every 1 hour x 3 doses as needed for chest pain/ fast heart rates 30 tablet 1   rosuvastatin (CRESTOR) 5 MG tablet Take 1 tablet (5 mg total) by mouth every Monday, Wednesday, and Friday. 36 tablet 3   Current Facility-Administered Medications on File Prior to Visit  Medication Dose Route Frequency Provider Last Rate Last Admin   Study - ORION 4 - inclisiran 300 mg/1.46mL or placebo SQ injection (PI-Stuckey)  300 mg Subcutaneous Q6 months Herby Abraham, MD         Review of Systems  Constitutional:  Positive for fatigue. Negative for activity change, appetite change and fever.  HENT:  Negative for congestion and sore throat.   Eyes:  Negative for itching and visual disturbance.  Respiratory:  Negative for cough and shortness of breath.   Cardiovascular:  Negative for leg swelling.  Gastrointestinal:  Negative for abdominal distention, abdominal pain, constipation, diarrhea and nausea.  Endocrine: Negative for cold intolerance and polydipsia.  Genitourinary:  Positive for dysuria, frequency and urgency. Negative for difficulty urinating, flank pain and hematuria.  Musculoskeletal:  Positive for back pain. Negative for myalgias.       Low back pain with standing   Skin:  Negative for rash.  Allergic/Immunologic: Negative for immunocompromised state.  Neurological:  Negative for dizziness and weakness.  Hematological:  Negative for adenopathy.  Psychiatric/Behavioral:         Stressors due to husband's health       Objective:   Physical Exam Constitutional:      General: She is not in acute distress.    Appearance: Normal appearance. She is well-developed. She is not ill-appearing or diaphoretic.     Comments: Overweight   HENT:     Head: Normocephalic and atraumatic.     Mouth/Throat:     Mouth: Mucous membranes are moist.  Eyes:     General: No scleral icterus.    Conjunctiva/sclera:  Conjunctivae normal.     Pupils: Pupils are equal, round, and reactive to light.  Cardiovascular:     Rate and Rhythm: Normal rate and regular rhythm.     Heart sounds: Normal heart sounds.  Pulmonary:     Effort: Pulmonary effort is normal.     Breath sounds: Normal breath sounds.  Abdominal:     General: Bowel sounds are normal. There is no distension.     Palpations: Abdomen is soft.     Tenderness: There is abdominal tenderness. There is no rebound.     Comments: No cva tenderness  Mild suprapubic tenderness (bladder does not feel distended)    Musculoskeletal:     Cervical back: Normal range of motion and neck supple.  Lymphadenopathy:     Cervical: No cervical adenopathy.  Skin:    Findings: No rash.  Neurological:     Mental  Status: She is alert.     Cranial Nerves: No cranial nerve deficit.     Motor: No weakness.  Psychiatric:        Mood and Affect: Mood normal.           Assessment & Plan:   Problem List Items Addressed This Visit       Cardiovascular and Mediastinum   Essential hypertension    bp in fair control at this time  BP Readings from Last 1 Encounters:  10/13/22 136/74  This is usually lower at home and also in cardiology office  Most recent labs reviewed as well as cardiology note Disc lifstyle change with low sodium diet and exercise  Plans to continue Amlodipine 2.5 mg daily  imdur 60 mg daily  Prn propranolol       Type 2 diabetes mellitus with diabetic peripheral angiopathy without gangrene, without long-term current use of insulin    Lab Results  Component Value Date   HGBA1C 6.1 (A) 10/13/2022  Well controlled - down from 6.2  Off jardiance /did not tolerate  Eating well  Continues metformin xr 500 mg daily and glipizide xl 10mg  daily  Watches for low glucose readings       Relevant Orders   POCT glycosylated hemoglobin (Hb A1C) (Completed)     Endocrine   Hyperlipidemia associated with type 2 diabetes mellitus    Disc  goals for lipids and reasons to control them Rev last labs with pt Rev low sat fat diet in detail LDL of 127 in January  Unable to get it to goal with statin intol Cardiology recently inc her crestor 5 mg from 2 to 3 times weekly  Tolerating this so far          Genitourinary   Acute cystitis - Primary    More frequency and urgency lately  Some dysuria last wk  Some rbc and wbc in ua   Px macrobid  Enc fluids Handout given  Cx pending  Inst to call if symptoms worsen before the cx result returns        Relevant Orders   Urine Culture

## 2022-10-13 NOTE — Assessment & Plan Note (Addendum)
Disc goals for lipids and reasons to control them Rev last labs with pt Rev low sat fat diet in detail LDL of 127 in January  Unable to get it to goal with statin intol Cardiology recently inc her crestor 5 mg from 2 to 3 times weekly  Tolerating this so far

## 2022-10-13 NOTE — Assessment & Plan Note (Signed)
More frequency and urgency lately  Some dysuria last wk  Some rbc and wbc in ua   Px macrobid  Enc fluids Handout given  Cx pending  Inst to call if symptoms worsen before the cx result returns

## 2022-10-13 NOTE — Assessment & Plan Note (Signed)
Lab Results  Component Value Date   HGBA1C 6.1 (A) 10/13/2022   Well controlled - down from 6.2  Off jardiance /did not tolerate  Eating well  Continues metformin xr 500 mg daily and glipizide xl 10mg  daily  Watches for low glucose readings

## 2022-10-14 LAB — URINE CULTURE
MICRO NUMBER:: 14794663
SPECIMEN QUALITY:: ADEQUATE

## 2022-10-22 DIAGNOSIS — D225 Melanocytic nevi of trunk: Secondary | ICD-10-CM | POA: Diagnosis not present

## 2022-10-22 DIAGNOSIS — D0461 Carcinoma in situ of skin of right upper limb, including shoulder: Secondary | ICD-10-CM | POA: Diagnosis not present

## 2022-10-22 DIAGNOSIS — C44719 Basal cell carcinoma of skin of left lower limb, including hip: Secondary | ICD-10-CM | POA: Diagnosis not present

## 2022-10-22 DIAGNOSIS — D2261 Melanocytic nevi of right upper limb, including shoulder: Secondary | ICD-10-CM | POA: Diagnosis not present

## 2022-10-22 DIAGNOSIS — Z85828 Personal history of other malignant neoplasm of skin: Secondary | ICD-10-CM | POA: Diagnosis not present

## 2022-10-22 DIAGNOSIS — D485 Neoplasm of uncertain behavior of skin: Secondary | ICD-10-CM | POA: Diagnosis not present

## 2022-10-22 DIAGNOSIS — D0472 Carcinoma in situ of skin of left lower limb, including hip: Secondary | ICD-10-CM | POA: Diagnosis not present

## 2022-10-22 DIAGNOSIS — D0439 Carcinoma in situ of skin of other parts of face: Secondary | ICD-10-CM | POA: Diagnosis not present

## 2022-10-22 DIAGNOSIS — L570 Actinic keratosis: Secondary | ICD-10-CM | POA: Diagnosis not present

## 2022-10-22 DIAGNOSIS — D2272 Melanocytic nevi of left lower limb, including hip: Secondary | ICD-10-CM | POA: Diagnosis not present

## 2022-10-23 ENCOUNTER — Ambulatory Visit: Payer: Self-pay | Admitting: Internal Medicine

## 2022-11-23 ENCOUNTER — Other Ambulatory Visit: Payer: Self-pay | Admitting: Family Medicine

## 2022-12-05 ENCOUNTER — Encounter: Payer: Self-pay | Admitting: Emergency Medicine

## 2022-12-05 ENCOUNTER — Telehealth: Payer: Self-pay | Admitting: Family Medicine

## 2022-12-05 ENCOUNTER — Ambulatory Visit
Admission: EM | Admit: 2022-12-05 | Discharge: 2022-12-05 | Disposition: A | Discharge status: Home / Self Care | Payer: PPO | Attending: Emergency Medicine | Admitting: Emergency Medicine

## 2022-12-05 DIAGNOSIS — I10 Essential (primary) hypertension: Secondary | ICD-10-CM | POA: Diagnosis not present

## 2022-12-05 DIAGNOSIS — R109 Unspecified abdominal pain: Secondary | ICD-10-CM | POA: Diagnosis not present

## 2022-12-05 DIAGNOSIS — M549 Dorsalgia, unspecified: Secondary | ICD-10-CM | POA: Diagnosis not present

## 2022-12-05 LAB — POCT URINALYSIS DIP (MANUAL ENTRY)
Bilirubin, UA: NEGATIVE
Blood, UA: NEGATIVE
Glucose, UA: NEGATIVE mg/dL
Ketones, POC UA: NEGATIVE mg/dL
Leukocytes, UA: NEGATIVE
Nitrite, UA: NEGATIVE
Protein Ur, POC: NEGATIVE mg/dL
Spec Grav, UA: >=1.03 — AB (ref 1.010–1.025)
Urobilinogen, UA: 0.2 E.U./dL
pH, UA: 5.5 (ref 5.0–8.0)

## 2022-12-05 NOTE — Discharge Instructions (Addendum)
Your urine does not show signs of infection.  Increase your water intake.    Go to the emergency department if you have persistent or worsening symptoms.    Follow-up with your primary care provider.    Your blood pressure is elevated today at 180/72; repeat 145/72.  Please have this rechecked by your primary care provider in next week.

## 2022-12-05 NOTE — Telephone Encounter (Signed)
Noted and agree. 

## 2022-12-05 NOTE — ED Provider Notes (Signed)
[facility]    CSN: [id number] Arrival date & time: 12/05/22  1345      History   Chief Complaint No chief complaint on file.   HPI Margaret Vazquez is a 87 y.o. female.  Patient presents with right mid back and flank pain since early morning.  No falls or injury.  The pain is non-radiating; worse with position changes and movement; improves with resting.  She denies fever, chills, abdominal pain, dysuria, hematuria, vomiting, diarrhea, constipation, numbness, weakness, paresthesias, or other symptoms.  No treatment at home.  Her medical history includes hypertension and diabetes.    The history is provided by the patient and medical records.    Past Medical History:  Diagnosis Date   Allergy history, drug    Aspirin   Basal cell carcinoma    "back"   CAD (coronary artery disease)    a. Previously nonobstructive then progressive angina with abnl CT -> cardiac cath 12/24/17 showed 30% mid RCA and 80% prox-mid Cx. She received DES to mid AV groove Cx. EF 55-65%.    Cervical stenosis of spine    With neck pain   CKD (chronic kidney disease), stage II    Colon polyps 2009   Complication of anesthesia 1980s   slow to wake after anesthesia "when I had breast biopsy"   GERD (gastroesophageal reflux disease)    Hyperlipidemia    myalgias with Lipitor and Zetia   Hypertension    Migraine    "stopped at age 84" (12/24/2017)   Squamous carcinoma    "iced off and cut off; mostly arms" (12/24/2017)   Stroke [facility])    "told me I'd had 2 strokes in 02/2013"; denies residual on 12/24/2017   TIA (transient ischemic attack)    Type II diabetes mellitus (HCC)     Patient Active Problem List   Diagnosis Date Noted   Acute cystitis 10/13/2022   Chest pain 04/20/2022   Type II diabetes mellitus (HCC)    Estrogen deficiency 11/07/2021   Grief reaction 05/08/2021   Elevated TSH 11/05/2020   Allergy to statin medication 03/26/2020   History of loop recorder 09/06/2019   History of  seizure 03/23/2019   Left-sided epistaxis 11/17/2018   Medicare annual wellness visit, subsequent 09/13/2018   Screening mammogram, encounter for 09/13/2018   Herpes zoster 08/10/2018   Coronary artery disease 12/24/2017   Routine general medical examination at a health care facility 08/24/2016   PAF (paroxysmal atrial fibrillation) (HCC) 04/07/2016   Overweight (BMI 25.0-29.9) 08/23/2014   Type 2 diabetes mellitus with diabetic peripheral angiopathy without gangrene, without long-term current use of insulin (HCC) 01/12/2014   History of CVA (cerebrovascular accident) 01/11/2014   LOOP Recorder LINQ 06/07/2013   Sinusitis, chronic 05/16/2013   H/O: CVA (cerebrovascular accident) 02/15/2013   Encounter for Medicare annual wellness exam 10/27/2012   Encopresis(307.7) 12/15/2011   Post-menopausal 10/28/2011   PERSONAL HX COLONIC POLYPS 09/04/2009   B12 deficiency 03/06/2009   ALLERGIC RHINITIS 02/14/2008   BACK PAIN, LUMBAR 11/03/2007   Hyperlipidemia associated with type 2 diabetes mellitus (HCC) 11/24/2006   Essential hypertension 11/24/2006   GERD 10/09/2006   OVERACTIVE BLADDER 10/09/2006   INCONTINENCE, URGE 10/09/2006   SKIN CANCER, HX OF 10/09/2006    Past Surgical History:  Procedure Laterality Date   ABD [place]  07/2003   Negative   APPENDECTOMY     BASAL CELL CARCINOMA EXCISION     "back"   BREAST BIOPSY Left 1992  benign   CARDIAC CATHETERIZATION  04/07/2011   non obst CAD (Dr Excell Seltzer)   CATARACT EXTRACTION W/PHACO Left 04/01/2017   Procedure: CATARACT EXTRACTION PHACO AND INTRAOCULAR LENS PLACEMENT (IOC) LEFT DIABETIC;  Surgeon: Lockie Mola, MD;  Location: [facility];  Service: Ophthalmology;  Laterality: Left;  Diabetic - oral meds   CATARACT EXTRACTION W/PHACO Right 04/29/2017   Procedure: CATARACT EXTRACTION PHACO AND INTRAOCULAR LENS PLACEMENT (IOC) RIGHT DIABETIC;  Surgeon: Lockie Mola, MD;  Location: [facility];  Service:  Ophthalmology;  Laterality: Right;  Diabetic - oral meds   COLONOSCOPY  12/2007   Adenomatous colon polyps   CORONARY ANGIOPLASTY WITH STENT PLACEMENT  12/24/2017   CORONARY STENT INTERVENTION N/A 12/24/2017   Procedure: CORONARY STENT INTERVENTION;  Surgeon: Runell Gess, MD;  Location: [facility];  Service: Cardiovascular;  Laterality: N/A;   CYSTOSCOPY W/ DECANNULATION  03/2000   Normal   LEFT HEART CATH AND CORONARY ANGIOGRAPHY N/A 12/24/2017   Procedure: LEFT HEART CATH AND CORONARY ANGIOGRAPHY;  Surgeon: Runell Gess, MD;  Location: [facility];  Service: Cardiovascular;  Laterality: N/A;   LOOP RECORDER IMPLANT N/A 02/16/2013   Procedure: LOOP RECORDER IMPLANT;  Surgeon: Duke Salvia, MD;  Location: [facility] C[facility];  Service: Cardiovascular;  Laterality: N/A;   MOHS SURGERY     right hand    NASAL SINUS SURGERY  01/2005   SQUAMOUS CELL CARCINOMA EXCISION     "mostly arms;" (12/24/2017)   STRESS CARDIOLITE  11/1999   Normal/ negative   TEAR DUCT PROBING  2005   "? side"   TEE WITHOUT CARDIOVERSION N/A 02/16/2013   Procedure: TRANSESOPHAGEAL ECHOCARDIOGRAM (TEE);  Surgeon: Wendall Stade, MD;  Location: [facility] ENDOSCOPY;  Service: Cardiovascular;  Laterality: N/A;   TUBAL LIGATION     BTL    OB History   No obstetric history on file.      Home Medications    Prior to Admission medications   Medication Sig Start Date End Date Taking? Authorizing Provider  amLODipine (NORVASC) 2.5 MG tablet Take 1 tablet (2.5 mg) by mouth once daily 02/20/22   Duke Salvia, MD  apixaban (ELIQUIS) 5 MG TABS tablet TAKE 1 TABLET(5 MG) BY MOUTH TWICE DAILY 09/18/22   Duke Salvia, MD  blood glucose meter kit and supplies Dispense based on patient and insurance preference. Use up to four times daily as directed. (FOR ICD-10 E10.9, E11.9). 04/19/22   Tower, Audrie Gallus, MD  Blood Glucose Monitoring Suppl (ONE TOUCH ULTRA 2) w/Device KIT Use to check blood sugar once daily 10/25/18    Tower, [place] A, MD  Cyanocobalamin (VITAMIN B-12 PO) Take 1 tablet by mouth daily.    [provider]  diphenhydrAMINE (BENADRYL) 25 MG tablet Take 50 mg by mouth every 8 (eight) hours as needed for allergies.     [provider]  EPINEPHrine (EPI-PEN) 0.3 mg/0.3 mL DEVI Inject 0.3 mg into the muscle daily as needed (allergic reaction). 12/20/10   Tower, Audrie Gallus, MD  fluticasone (FLONASE) 50 MCG/ACT nasal spray Place 2 sprays into both nostrils 2 (two) times daily as needed. Reported on 09/10/2015 01/20/14   [provider]  glipiZIDE (GLUCOTROL XL) 10 MG 24 hr tablet Take 1 tablet (10 mg total) by mouth daily with breakfast. 04/29/22   Tower, Audrie Gallus, MD  glucose blood (ONETOUCH ULTRA) test strip USE TO CHECK BLOOD SUGAR ONCE DAILY AS DIRECTED 12/12/21   Tower, Audrie Gallus, MD  isosorbide mononitrate (IMDUR) 60 MG 24 hr tablet TAKE 1 TABLET(60 MG) BY MOUTH DAILY 07/25/22   Duke Salvia, MD  metFORMIN (GLUCOPHAGE-XR) 500 MG 24 hr tablet TAKE 1 TABLET(500 MG) BY MOUTH DAILY WITH BREAKFAST 11/24/22   Tower, Audrie Gallus, MD  nitrofurantoin, macrocrystal-monohydrate, (MACROBID) 100 MG capsule Take 1 capsule (100 mg total) by mouth 2 (two) times daily. 10/13/22   Tower, Audrie Gallus, MD  nitroGLYCERIN (NITROSTAT) 0.4 MG SL tablet Place 1 tablet (0.4 mg total) under the tongue every 5 (five) minutes as needed for chest pain (up to 3 doses. If taking 3rd dose call 911). 12/25/17   Dunn, Tacey Ruiz, PA-C  potassium chloride SA (KLOR-CON M) 20 MEQ tablet Take 1 tablet (20 mEq total) by mouth daily. 11/07/21   Tower, Audrie Gallus, MD  propranolol (INDERAL) 10 MG tablet Take 1 tablet (10 mg) by mouth every 1 hour x 3 doses as needed for chest pain/ fast heart rates 05/03/20   Duke Salvia, MD  rosuvastatin (CRESTOR) 5 MG tablet Take 1 tablet (5 mg total) by mouth every Monday, Wednesday, and Friday. 08/15/22 11/13/22  Charlsie Quest, NP    Family History Family History  Problem Relation Age of Onset   Lung cancer  Brother    Diabetes Brother    Pancreatic cancer Brother    Heart disease Mother    Heart disease Father    Brain cancer Other    Skin cancer Daughter    Diabetes Sister    Breast cancer Sister    Colon cancer Neg Hx     Social History Social History   Tobacco Use   Smoking status: Never   Smokeless tobacco: Never  Vaping Use   Vaping Use: Never used  Substance Use Topics   Alcohol use: Never    Alcohol/week: 0.0 standard drinks of alcohol   Drug use: Never     Allergies   Bee venom, Nabumetone, Amoxicillin-pot clavulanate, Aspirin, Atorvastatin, Clopidogrel bisulfate, Codeine, Ezetimibe, Metformin and related, Valsartan, Jardiance [empagliflozin], and Other   Review of Systems Review of Systems  Constitutional:  Negative for chills and fever.  Respiratory:  Negative for cough and shortness of breath.   Cardiovascular:  Negative for chest pain and palpitations.  Gastrointestinal:  Negative for abdominal pain, diarrhea, nausea and vomiting.  Genitourinary:  Negative for dysuria and hematuria.  Musculoskeletal:  Positive for back pain. Negative for arthralgias, gait problem and joint swelling.  Skin:  Negative for color change, rash and wound.  Neurological:  Negative for weakness and numbness.  All other systems reviewed and are negative.    Physical Exam Triage Vital Signs ED Triage Vitals [12/05/22 1442]  Enc Vitals Group     BP (!) 180/72     Pulse Rate 61     Resp 18     Temp 98 F (36.7 C)     Temp src      SpO2 96 %     Weight      Height      Head Circumference      Peak Flow      Pain Score      Pain Loc      Pain Edu?      Excl. in GC?    No data found.  Updated Vital Signs BP (!) 145/72   Pulse 61   Temp 98 F (36.7 C)   Resp 18   SpO2 96%   Visual Acuity Right Eye Distance:  Left Eye Distance:   Bilateral Distance:    Right Eye Near:   Left Eye Near:    Bilateral Near:     Physical Exam Vitals and nursing note reviewed.   Constitutional:      General: She is not in acute distress.    Appearance: She is well-developed. She is not ill-appearing.  HENT:     Head: Normocephalic and atraumatic.     Mouth/Throat:     Mouth: Mucous membranes are moist.  Cardiovascular:     Rate and Rhythm: Normal rate and regular rhythm.     Heart sounds: Normal heart sounds.  Pulmonary:     Effort: Pulmonary effort is normal. No respiratory distress.     Breath sounds: Normal breath sounds.  Abdominal:     General: Bowel sounds are normal.     Palpations: Abdomen is soft.     Tenderness: There is no abdominal tenderness. There is no right CVA tenderness, left CVA tenderness, guarding or rebound.  Musculoskeletal:        General: No swelling, tenderness, deformity or signs of injury. Normal range of motion.     Cervical back: Neck supple.  Skin:    General: Skin is warm and dry.     Capillary Refill: Capillary refill takes less than 2 seconds.     Findings: No bruising, erythema, lesion or rash.  Neurological:     General: No focal deficit present.     Mental Status: She is alert and oriented to person, place, and time.     Sensory: No sensory deficit.     Motor: No weakness.     Gait: Gait normal.  Psychiatric:        Mood and Affect: Mood normal.        Behavior: Behavior normal.      UC Treatments / Results  Labs (all labs ordered are listed, but only abnormal results are displayed) Labs Reviewed  POCT URINALYSIS DIP (MANUAL ENTRY) - Abnormal; Notable for the following components:      Result Value   Spec Grav, UA >=1.030 (*)    All other components within normal limits    EKG   Radiology No results found.  Procedures Procedures (including critical care time)  Medications Ordered in UC Medications - No data to display  Initial Impression / Assessment and Plan / UC Course  I have reviewed the triage vital signs and the nursing notes.  Pertinent labs & imaging results that were available  during my care of the patient were reviewed by me and considered in my medical decision making (see chart for details).    Mid back pain, Right flank pain, Elevated blood pressure with HTN.  Urine does not have blood or signs of infection.  Instructed patient to increase her water intake.  Discussed Tylenol as needed for discomfort.  ED precautions discussed.  Instructed patient to follow-up with her PCP.  Education provided on back pain and flank pain.  Also discussed with patient that her blood pressure is elevated today and needs to be rechecked by PCP in next week.  Education provided on managing hypertension.  She agrees to plan of care.   Final Clinical Impressions(s) / UC Diagnoses   Final diagnoses:  Mid back pain  Right flank pain  Elevated blood pressure reading in office with diagnosis of hypertension     Discharge Instructions      Your urine does not show signs of infection.  Increase your water intake.  Go to the emergency department if you have persistent or worsening symptoms.    Follow-up with your primary care provider.    Your blood pressure is elevated today at 180/72; repeat 145/72.  Please have this rechecked by your primary care provider in next week.            ED Prescriptions   None    PDMP not reviewed this encounter.   Mickie Bail, NP 12/05/22 [phone]

## 2022-12-05 NOTE — Telephone Encounter (Signed)
Patient contacted the office wanting an appointment for today, I have searched across all [place] offices and have not found any with an open slot. Patient is having urinary symptoms of back pain and not urinating as much. Told patient I would send a message to see if there was anything I could do to find her  a slot to be seen. Please advise

## 2022-12-05 NOTE — Telephone Encounter (Signed)
Called and spoke with patient, advised we have no openings at any [employer] and our office is closed to in person visits. Advised patient she will need to be seen at [facility] for evaluation as they will need to run her urine. Patient verbalized understanding and stated she will go to the [facility] in [place].

## 2022-12-07 ENCOUNTER — Encounter: Payer: Self-pay | Admitting: Emergency Medicine

## 2022-12-07 ENCOUNTER — Emergency Department
Admission: EM | Admit: 2022-12-07 | Discharge: 2022-12-07 | Disposition: A | Discharge status: Home / Self Care | Payer: PPO | Attending: Emergency Medicine | Admitting: Emergency Medicine

## 2022-12-07 ENCOUNTER — Other Ambulatory Visit: Payer: Self-pay

## 2022-12-07 ENCOUNTER — Emergency Department: Payer: PPO

## 2022-12-07 DIAGNOSIS — Z8673 Personal history of transient ischemic attack (TIA), and cerebral infarction without residual deficits: Secondary | ICD-10-CM | POA: Insufficient documentation

## 2022-12-07 DIAGNOSIS — I700 Atherosclerosis of aorta: Secondary | ICD-10-CM | POA: Diagnosis not present

## 2022-12-07 DIAGNOSIS — X58XXXA Exposure to other specified factors, initial encounter: Secondary | ICD-10-CM | POA: Diagnosis not present

## 2022-12-07 DIAGNOSIS — R109 Unspecified abdominal pain: Secondary | ICD-10-CM | POA: Diagnosis not present

## 2022-12-07 DIAGNOSIS — S39012A Strain of muscle, fascia and tendon of lower back, initial encounter: Secondary | ICD-10-CM | POA: Insufficient documentation

## 2022-12-07 DIAGNOSIS — M549 Dorsalgia, unspecified: Secondary | ICD-10-CM | POA: Diagnosis present

## 2022-12-07 DIAGNOSIS — I4891 Unspecified atrial fibrillation: Secondary | ICD-10-CM | POA: Diagnosis not present

## 2022-12-07 DIAGNOSIS — Z7901 Long term (current) use of anticoagulants: Secondary | ICD-10-CM | POA: Insufficient documentation

## 2022-12-07 LAB — URINALYSIS, ROUTINE W REFLEX MICROSCOPIC
Bacteria, UA: NONE SEEN
Bilirubin Urine: NEGATIVE
Glucose, UA: NEGATIVE mg/dL
Ketones, ur: NEGATIVE mg/dL
Leukocytes,Ua: NEGATIVE
Nitrite: NEGATIVE
Protein, ur: NEGATIVE mg/dL
Specific Gravity, Urine: 1.021 (ref 1.005–1.030)
pH: 5 (ref 5.0–8.0)

## 2022-12-07 LAB — COMPREHENSIVE METABOLIC PANEL
ALT: 13 U/L (ref 0–44)
AST: 17 U/L (ref 15–41)
Albumin: 4 g/dL (ref 3.5–5.0)
Alkaline Phosphatase: 71 U/L (ref 38–126)
Anion gap: 8 (ref 5–15)
BUN: 17 mg/dL (ref 8–23)
CO2: 26 mmol/L (ref 22–32)
Calcium: 8.6 mg/dL — ABNORMAL LOW (ref 8.9–10.3)
Chloride: 105 mmol/L (ref 98–111)
Creatinine, Ser: 0.89 mg/dL (ref 0.44–1.00)
GFR, Estimated: >60 mL/min (ref >60)
Glucose, Bld: 128 mg/dL — ABNORMAL HIGH (ref 70–99)
Potassium: 4.1 mmol/L (ref 3.5–5.1)
Sodium: 139 mmol/L (ref 135–145)
Total Bilirubin: 0.7 mg/dL (ref 0.3–1.2)
Total Protein: 7 g/dL (ref 6.5–8.1)

## 2022-12-07 LAB — CBC
HCT: 44 % (ref 36.0–46.0)
Hemoglobin: 14.1 g/dL (ref 12.0–15.0)
MCH: 30.9 pg (ref 26.0–34.0)
MCHC: 32 g/dL (ref 30.0–36.0)
MCV: 96.3 fL (ref 80.0–100.0)
Platelets: 197 10*3/uL (ref 150–400)
RBC: 4.57 MIL/uL (ref 3.87–5.11)
RDW: 12.6 % (ref 11.5–15.5)
WBC: 6.5 10*3/uL (ref 4.0–10.5)
nRBC: 0 % (ref 0.0–0.2)

## 2022-12-07 LAB — LIPASE, BLOOD: Lipase: 27 U/L (ref 11–51)

## 2022-12-07 LAB — CBG MONITORING, ED
Glucose-Capillary: 48 mg/dL — ABNORMAL LOW (ref 70–99)
Glucose-Capillary: 105 mg/dL — ABNORMAL HIGH (ref 70–99)

## 2022-12-07 MED ORDER — OXYCODONE HCL 5 MG PO TABS
2.5000 mg | ORAL_TABLET | Freq: Once | ORAL | Status: AC
  Administered 2022-12-07: 2.5 mg via ORAL
  Filled 2022-12-07: qty 1

## 2022-12-07 MED ORDER — LIDOCAINE 5 % EX PTCH
1.0000 | MEDICATED_PATCH | CUTANEOUS | Status: DC
  Administered 2022-12-07: 1 via TRANSDERMAL
  Filled 2022-12-07: qty 1

## 2022-12-07 MED ORDER — IOHEXOL 300 MG/ML  SOLN
100.0000 mL | Freq: Once | INTRAMUSCULAR | Status: AC | PRN
  Administered 2022-12-07: 100 mL via INTRAVENOUS

## 2022-12-07 MED ORDER — LIDOCAINE 5 % EX PTCH: 1.0000 | MEDICATED_PATCH | Freq: Two times a day (BID) | CUTANEOUS | 0 refills | Status: AC

## 2022-12-07 NOTE — Discharge Instructions (Addendum)
CT scan is as noted below.  I suspect this is most likely a muscle strain.  You can take Tylenol 1 g every 8 hours to help with pain with the lidocaine patches and follow-up with your primary care doctor return to the ER if you develop worsening symptoms or any other concerns.  You can try to use heat packs or massage as well.  Your glucose was negative but I suspect that this is related to you missing lunch but make sure you are eating plenty of food today and checking your sugar before bedtime.  MPRESSION: No acute findings within the abdomen or pelvis.   Tiny hiatal hernia.   Aortic Atherosclerosis (ICD10-I70.0).

## 2022-12-07 NOTE — ED Provider Notes (Signed)
[facility] Provider Note    Event Date/Time   First MD Initiated Contact with Patient 12/07/22 1252     (approximate)   History   Back Pain   HPI  Margaret Vazquez is a 87 y.o. female who comes in for back pain.  Patient reports some mid flank back pain.  She was seen in urgent care on 5/31.  Patient taken Tylenol and discharged after reassuring urine but came in due to increasing pain.  Patient does report history of stroke, A-fib which she is on Eliquis for she denies any falls or hitting her head.  She reports right low to mid back pain.  She denies any chest pain, shortness of breath.  She denies having this previously.  She reports that she just woke up a few days ago with a it is worse with movement denies any obvious urinary symptoms.   Physical Exam   Triage Vital Signs: ED Triage Vitals  Enc Vitals Group     BP 12/07/22 1113 (!) 166/73     Pulse Rate 12/07/22 1113 64     Resp 12/07/22 1113 18     Temp 12/07/22 1113 98.8 F (37.1 C)     Temp Source 12/07/22 1113 Oral     SpO2 12/07/22 1113 96 %     Weight 12/07/22 1257 191 lb 9.3 oz (86.9 kg)     Height 12/07/22 1257 5\' 8"  (1.727 m)     Head Circumference --      Peak Flow --      Pain Score 12/07/22 1113 6     Pain Loc --      Pain Edu? --      Excl. in GC? --     Most recent vital signs: Vitals:   12/07/22 1113  BP: (!) 166/73  Pulse: 64  Resp: 18  Temp: 98.8 F (37.1 C)  SpO2: 96%     General: Awake, no distress.  CV:  Good peripheral perfusion.  Resp:  Normal effort.  Abd:  No distention.  Other:  No CTL spine tenderness but she is got same right flank tenderness without any rash noted.  Able to lift both legs up off the bed distal pulses intact in the feet.  No chest pain.  Sensation intact bilaterally.  Denies any rectal numbness.  ED Results / Procedures / Treatments   Labs (all labs ordered are listed, but only abnormal results are displayed) Labs Reviewed   COMPREHENSIVE METABOLIC PANEL - Abnormal; Notable for the following components:      Result Value   Glucose, Bld 128 (*)    Calcium 8.6 (*)    All other components within normal limits  URINALYSIS, ROUTINE W REFLEX MICROSCOPIC - Abnormal; Notable for the following components:   Color, Urine YELLOW (*)    APPearance HAZY (*)    Hgb urine dipstick SMALL (*)    All other components within normal limits  CBC       RADIOLOGY I have reviewed the CT personally interpreted and no kidney stones  PROCEDURES:  Critical Care performed: No  Procedures   MEDICATIONS ORDERED IN ED: Medications - No data to display   IMPRESSION / MDM / ASSESSMENT AND PLAN / ED COURSE  I reviewed the triage vital signs and the nursing notes.   Patient's presentation is most consistent with acute presentation with potential threat to life or bodily function.   Patient comes in with right back pain.  No evidence of any rash but given the continued pain not better with Tylenol will get CT imaging to ensure there is nothing else is going on such as kidney stone, obstruction, perforation etc.  No chest pain or shortness of breath she is already on Eliquis so doubt PE- no symptoms of cord compression- good strength in legs   Urine without any evidence of UTI.  CMP is reassuring.  CBC reassuring  MPRESSION: No acute findings within the abdomen or pelvis.   Tiny hiatal hernia.   Aortic Atherosclerosis (ICD10-I70.0).  CT imaging was reassuring.  Discussed with patient she feels much better after medications.  She continues to deny any chest pain or shortness of breath.  She states the pain is worse with movement and with trying to sit up so seems most likely musculoskeletal she wants to try the lidocaine patches at home with the Tylenol.  Would like to try to hold off on the oxycodone due to the risk for oxycodone will follow-up with her primary care doctor return to the ER if develops worsening symptoms.  To  note patient does have diabetes and it states that she ate some breakfast but has skipped the lunch and that she felt like her sugar was low.  She was given a meal tray after her sugar was noted to be in the 50s and reports feeling much better afterwards and her repeat sugar was reassuring.  I suspect that the low sugar was secondary to being n.p.o. while being here for over 6 hours and now that she is tolerating p.o. she is feeling much better and feels comfortable with discharge home  FINAL CLINICAL IMPRESSION(S) / ED DIAGNOSES   Final diagnoses:  Back strain, initial encounter     Rx / DC Orders   ED Discharge Orders          Ordered    lidocaine (LIDODERM) 5 %  Every 12 hours        12/07/22 1657             Note:  This document was prepared using Dragon voice recognition software and may include unintentional dictation errors.   Concha Se, MD 12/07/22 [phone]

## 2022-12-07 NOTE — ED Notes (Signed)
Pt given sandwich tray and ginger ale. 

## 2022-12-07 NOTE — ED Triage Notes (Signed)
Pt reports woke up with back pain on Friday. Pt states called her MD but they could not see her so they told her to go to urgent care. Pt reports was seen at [facility] and discharged home. Pt reports pain has continued and she is not sure what is going on. Pt reports they checked her urine but she does not know what the result was. Pt reports pain is mid right side of back and does not radiate anywhere else

## 2022-12-09 ENCOUNTER — Telehealth: Payer: Self-pay | Admitting: *Deleted

## 2022-12-09 DIAGNOSIS — E1169 Type 2 diabetes mellitus with other specified complication: Secondary | ICD-10-CM

## 2022-12-09 DIAGNOSIS — E785 Hyperlipidemia, unspecified: Secondary | ICD-10-CM

## 2022-12-09 DIAGNOSIS — E782 Mixed hyperlipidemia: Secondary | ICD-10-CM

## 2022-12-09 DIAGNOSIS — Z79899 Other long term (current) drug therapy: Secondary | ICD-10-CM

## 2022-12-09 DIAGNOSIS — I2510 Atherosclerotic heart disease of native coronary artery without angina pectoris: Secondary | ICD-10-CM

## 2022-12-09 NOTE — Telephone Encounter (Signed)
-----   Message from Bryna Colander, RN sent at 08/15/2022  3:45 PM EST ----- Regarding: Lipid Liver panel in 3 months (May 2024) Lipid Liver panel in 3 months (May 2024)

## 2022-12-09 NOTE — Telephone Encounter (Signed)
Spoke with patient and reviewed we would like for her to have repeat labs. Instructed her to go over to the [facility] and check in at registration desk. She verbalized understanding and states she will get that done. No further needs at this time.

## 2022-12-10 ENCOUNTER — Other Ambulatory Visit
Admission: RE | Admit: 2022-12-10 | Discharge: 2022-12-10 | Disposition: A | Discharge status: Home / Self Care | Payer: PPO | Attending: Cardiology | Admitting: Cardiology

## 2022-12-10 DIAGNOSIS — I2510 Atherosclerotic heart disease of native coronary artery without angina pectoris: Secondary | ICD-10-CM | POA: Insufficient documentation

## 2022-12-10 DIAGNOSIS — E1169 Type 2 diabetes mellitus with other specified complication: Secondary | ICD-10-CM

## 2022-12-10 DIAGNOSIS — E782 Mixed hyperlipidemia: Secondary | ICD-10-CM | POA: Diagnosis not present

## 2022-12-10 DIAGNOSIS — Z79899 Other long term (current) drug therapy: Secondary | ICD-10-CM | POA: Diagnosis not present

## 2022-12-10 DIAGNOSIS — E785 Hyperlipidemia, unspecified: Secondary | ICD-10-CM | POA: Insufficient documentation

## 2022-12-10 LAB — LIPID PANEL
Cholesterol: 138 mg/dL (ref 0–200)
HDL: 47 mg/dL (ref >40)
LDL Cholesterol: 62 mg/dL (ref 0–99)
Total CHOL/HDL Ratio: 2.9 RATIO
Triglycerides: 144 mg/dL (ref <150)
VLDL: 29 mg/dL (ref 0–40)

## 2022-12-10 LAB — HEPATIC FUNCTION PANEL
ALT: 13 U/L (ref 0–44)
AST: 18 U/L (ref 15–41)
Albumin: 3.9 g/dL (ref 3.5–5.0)
Alkaline Phosphatase: 70 U/L (ref 38–126)
Bilirubin, Direct: <0.1 mg/dL (ref 0.0–0.2)
Total Bilirubin: 0.6 mg/dL (ref 0.3–1.2)
Total Protein: 7.1 g/dL (ref 6.5–8.1)

## 2022-12-11 ENCOUNTER — Telehealth: Payer: Self-pay | Admitting: *Deleted

## 2022-12-11 NOTE — Progress Notes (Signed)
Great improvement in total and bad cholesterol. Keep up the good work. Continue current medication regimen without changes at this time. Keep follow up appointment with Dr Graciela Husbands.

## 2022-12-11 NOTE — Telephone Encounter (Signed)
Transition Care Management Unsuccessful Follow-up Telephone Call  Date of discharge and from where:  [facility] 12/07/2022  Attempts:  1st Attempt  Reason for unsuccessful TCM follow-up call:  Left voice message

## 2022-12-17 ENCOUNTER — Ambulatory Visit (INDEPENDENT_AMBULATORY_CARE_PROVIDER_SITE_OTHER): Payer: PPO | Admitting: Family Medicine

## 2022-12-17 ENCOUNTER — Encounter: Payer: Self-pay | Admitting: Family Medicine

## 2022-12-17 VITALS — BP 122/60 | HR 66 | Temp 97.6°F | Ht 68.0 in | Wt 190.0 lb

## 2022-12-17 DIAGNOSIS — E1169 Type 2 diabetes mellitus with other specified complication: Secondary | ICD-10-CM

## 2022-12-17 DIAGNOSIS — E1151 Type 2 diabetes mellitus with diabetic peripheral angiopathy without gangrene: Secondary | ICD-10-CM

## 2022-12-17 DIAGNOSIS — E785 Hyperlipidemia, unspecified: Secondary | ICD-10-CM

## 2022-12-17 DIAGNOSIS — I10 Essential (primary) hypertension: Secondary | ICD-10-CM | POA: Diagnosis not present

## 2022-12-17 DIAGNOSIS — G8929 Other chronic pain: Secondary | ICD-10-CM

## 2022-12-17 DIAGNOSIS — M545 Low back pain, unspecified: Secondary | ICD-10-CM | POA: Diagnosis not present

## 2022-12-17 DIAGNOSIS — Z7984 Long term (current) use of oral hypoglycemic drugs: Secondary | ICD-10-CM

## 2022-12-17 NOTE — Assessment & Plan Note (Signed)
Disc goals for lipids and reasons to control them Rev last labs with pt Rev low sat fat diet in detail Improved with LDL of 62 -improved Tolerating crestor 5 mg three days weekly now

## 2022-12-17 NOTE — Assessment & Plan Note (Signed)
Mid back pain/worse on R side (mid to low) Past h/o deg LS change on xray-reviewed film from 2021  Seen in ER on 6/2 after UC visit on 5/31 Reviewed hospital records, lab results and studies in detail   Clear ua and reassuring w/u incl CT abd/pelvis (no renal stones or msk changes) Was therapeutic with once dose of oxycodone then lidocaine patches  Symptoms entirely resolved within 3 days Now rom is back  Discussed possible of severe muscle spasm that is now better  Encouraged walking and stretching as tolerated  Encouraged to f/u if symptoms return

## 2022-12-17 NOTE — Progress Notes (Signed)
Subjective:    Patient ID: Margaret Vazquez, female    DOB: 09-03-1935, 87 y.o.   MRN: [id number]  HPI Pt presents for f/u of ER visit on 12/07/22 for back strain  Much better now  Also HTN Also ? About potassium need / no longer taking  Also hyperlipidemia    Wt Readings from Last 3 Encounters:  12/17/22 190 lb (86.2 kg)  12/07/22 191 lb 9.3 oz (86.9 kg)  10/13/22 191 lb 8 oz (86.9 kg)   28.89 kg/m  Vitals:   12/17/22 1031 12/17/22 1101  BP: (!) 142/68 122/60  Pulse: 66   Temp: 97.6 F (36.4 C)   SpO2: 95%     Pt stopped taking her K  Unsure if she needs it  Lab Results  Component Value Date   K 4.1 12/07/2022   Was on hct in past and it was stopped   Pt presented to ER on 6/2 with mid flank back pain  (right) Had been on UC on 5/31- clear ua  Pain moved to R low to mid back -worse with movement  Bp was elevated at 166.73   Lab Results  Component Value Date   COLORU yellow 12/05/2022   CLARITYU clear 12/05/2022   GLUCOSEUR negative 12/05/2022   BILIRUBINUR NEGATIVE 12/07/2022   KETONESU Negative 10/13/2022   SPECGRAV >=1.030 (A) 12/05/2022   RBCUR negative 12/05/2022   PHUR 5.5 12/05/2022   PROTEINUR NEGATIVE 12/07/2022   UROBILINOGEN 0.2 12/05/2022   LEUKOCYTESUR NEGATIVE 12/07/2022   Lab Results  Component Value Date   WBC 6.5 12/07/2022   HGB 14.1 12/07/2022   HCT 44.0 12/07/2022   MCV 96.3 12/07/2022   PLT 197 12/07/2022   Lab Results  Component Value Date   LIPASE 27 12/07/2022    CMP     Component Value Date/Time   NA 139 12/07/2022 1123   NA 141 12/22/2017 1611   NA 144 08/29/2011 2307   K 4.1 12/07/2022 1123   K 3.6 08/29/2011 2307   CL 105 12/07/2022 1123   CL 103 08/29/2011 2307   CO2 26 12/07/2022 1123   CO2 26 08/29/2011 2307   GLUCOSE 128 (H) 12/07/2022 1123   GLUCOSE 162 (H) 08/29/2011 2307   BUN 17 12/07/2022 1123   BUN 21 12/22/2017 1611   BUN 14 08/29/2011 2307   CREATININE 0.89 12/07/2022 1123   CREATININE 0.98  01/27/2014 1448   CALCIUM 8.6 (L) 12/07/2022 1123   CALCIUM 8.9 08/29/2011 2307   PROT 7.1 12/10/2022 1355   ALBUMIN 3.9 12/10/2022 1355   AST 18 12/10/2022 1355   ALT 13 12/10/2022 1355   ALKPHOS 70 12/10/2022 1355   BILITOT 0.6 12/10/2022 1355   GFR 52.52 (L) 10/31/2021 0902   GFRNONAA >60 12/07/2022 1123   GFRNONAA 56 (L) 08/29/2011 2307   CT scan was reassuring / abd and pelvis Had tiny HH and aortic athero  Glucose was in 50s at end of visit- came up after eating  Improved in ER Given a dose or oxycodone  Px lidocaine patches  Encouraged tylenol prn   Of note lumbar film 2021 did show some degenerative changes    3 days after the Er visit back pain went away entirely  Lidocaine patches really helped  In retrospect very bad back spasm  Moving around fine     HTN in setting of PAF  bp is stable today  No cp or palpitations or headaches or edema  No side effects  to medicines  BP Readings from Last 3 Encounters:  12/17/22 122/60  12/07/22 (!) 160/110  12/05/22 (!) 145/72    Pulse Readings from Last 3 Encounters:  12/17/22 66  12/07/22 (!) 58  12/05/22 61    Amlodipine 2.5 mg daily  Imdur 60 mg daily  Prn propranolol    Lab Results  Component Value Date   HGBA1C 6.1 (A) 10/13/2022   Dm in good control   Hyperlipidemia Lab Results  Component Value Date   CHOL 138 12/10/2022   HDL 47 12/10/2022   LDLCALC 62 12/10/2022   LDLDIRECT 119.0 05/01/2021   TRIG 144 12/10/2022   CHOLHDL 2.9 12/10/2022   Crestor 5 mg three days per week Tolerating that now    Patient Active Problem List   Diagnosis Date Noted   Acute cystitis 10/13/2022   Chest pain 04/20/2022   Type II diabetes mellitus (HCC)    Estrogen deficiency 11/07/2021   Grief reaction 05/08/2021   Elevated TSH 11/05/2020   Allergy to statin medication 03/26/2020   History of loop recorder 09/06/2019   History of seizure 03/23/2019   Left-sided epistaxis 11/17/2018   Medicare annual  wellness visit, subsequent 09/13/2018   Screening mammogram, encounter for 09/13/2018   Herpes zoster 08/10/2018   Coronary artery disease 12/24/2017   Routine general medical examination at a health care facility 08/24/2016   PAF (paroxysmal atrial fibrillation) (HCC) 04/07/2016   Overweight (BMI 25.0-29.9) 08/23/2014   Type 2 diabetes mellitus with diabetic peripheral angiopathy without gangrene, without long-term current use of insulin (HCC) 01/12/2014   History of CVA (cerebrovascular accident) 01/11/2014   LOOP Recorder LINQ 06/07/2013   Sinusitis, chronic 05/16/2013   H/O: CVA (cerebrovascular accident) 02/15/2013   Encounter for Medicare annual wellness exam 10/27/2012   Encopresis(307.7) 12/15/2011   Post-menopausal 10/28/2011   PERSONAL HX COLONIC POLYPS 09/04/2009   B12 deficiency 03/06/2009   ALLERGIC RHINITIS 02/14/2008   BACK PAIN, LUMBAR 11/03/2007   Hyperlipidemia associated with type 2 diabetes mellitus (HCC) 11/24/2006   Essential hypertension 11/24/2006   GERD 10/09/2006   OVERACTIVE BLADDER 10/09/2006   INCONTINENCE, URGE 10/09/2006   SKIN CANCER, HX OF 10/09/2006   Past Medical History:  Diagnosis Date   Allergy history, drug    Aspirin   Basal cell carcinoma    "back"   CAD (coronary artery disease)    a. Previously nonobstructive then progressive angina with abnl CT -> cardiac cath 12/24/17 showed 30% mid RCA and 80% prox-mid Cx. She received DES to mid AV groove Cx. EF 55-65%.    Cervical stenosis of spine    With neck pain   CKD (chronic kidney disease), stage II    Colon polyps 2009   Complication of anesthesia 1980s   slow to wake after anesthesia "when I had breast biopsy"   GERD (gastroesophageal reflux disease)    Hyperlipidemia    myalgias with Lipitor and Zetia   Hypertension    Migraine    "stopped at age 23" (12/24/2017)   Squamous carcinoma    "iced off and cut off; mostly arms" (12/24/2017)   Stroke [facility])    "told me I'd had 2 strokes  in 02/2013"; denies residual on 12/24/2017   TIA (transient ischemic attack)    Type II diabetes mellitus (HCC)    Past Surgical History:  Procedure Laterality Date   ABD [place]  07/2003   Negative   APPENDECTOMY     BASAL CELL CARCINOMA EXCISION     "back"  BREAST BIOPSY Left 1992   benign   CARDIAC CATHETERIZATION  04/07/2011   non obst CAD (Dr Excell Seltzer)   CATARACT EXTRACTION W/PHACO Left 04/01/2017   Procedure: CATARACT EXTRACTION PHACO AND INTRAOCULAR LENS PLACEMENT (IOC) LEFT DIABETIC;  Surgeon: Lockie Mola, MD;  Location: [facility];  Service: Ophthalmology;  Laterality: Left;  Diabetic - oral meds   CATARACT EXTRACTION W/PHACO Right 04/29/2017   Procedure: CATARACT EXTRACTION PHACO AND INTRAOCULAR LENS PLACEMENT (IOC) RIGHT DIABETIC;  Surgeon: Lockie Mola, MD;  Location: [facility];  Service: Ophthalmology;  Laterality: Right;  Diabetic - oral meds   COLONOSCOPY  12/2007   Adenomatous colon polyps   CORONARY ANGIOPLASTY WITH STENT PLACEMENT  12/24/2017   CORONARY STENT INTERVENTION N/A 12/24/2017   Procedure: CORONARY STENT INTERVENTION;  Surgeon: Runell Gess, MD;  Location: [facility] INVASIVE CV LAB;  Service: Cardiovascular;  Laterality: N/A;   CYSTOSCOPY W/ DECANNULATION  03/2000   Normal   LEFT HEART CATH AND CORONARY ANGIOGRAPHY N/A 12/24/2017   Procedure: LEFT HEART CATH AND CORONARY ANGIOGRAPHY;  Surgeon: Runell Gess, MD;  Location: [facility] INVASIVE CV LAB;  Service: Cardiovascular;  Laterality: N/A;   LOOP RECORDER IMPLANT N/A 02/16/2013   Procedure: LOOP RECORDER IMPLANT;  Surgeon: Duke Salvia, MD;  Location: [facility] CATH LAB;  Service: Cardiovascular;  Laterality: N/A;   MOHS SURGERY     right hand    NASAL SINUS SURGERY  01/2005   SQUAMOUS CELL CARCINOMA EXCISION     "mostly arms;" (12/24/2017)   STRESS CARDIOLITE  11/1999   Normal/ negative   TEAR DUCT PROBING  2005   "? side"   TEE WITHOUT CARDIOVERSION N/A 02/16/2013   Procedure:  TRANSESOPHAGEAL ECHOCARDIOGRAM (TEE);  Surgeon: Wendall Stade, MD;  Location: [facility] ENDOSCOPY;  Service: Cardiovascular;  Laterality: N/A;   TUBAL LIGATION     BTL   Social History   Tobacco Use   Smoking status: Never   Smokeless tobacco: Never  Vaping Use   Vaping Use: Never used  Substance Use Topics   Alcohol use: Never    Alcohol/week: 0.0 standard drinks of alcohol   Drug use: Never   Family History  Problem Relation Age of Onset   Lung cancer Brother    Diabetes Brother    Pancreatic cancer Brother    Heart disease Mother    Heart disease Father    Brain cancer Other    Skin cancer Daughter    Diabetes Sister    Breast cancer Sister    Colon cancer Neg Hx    Allergies  Allergen Reactions   Bee Venom Hives, Shortness Of Breath and Swelling   Nabumetone Anaphylaxis   Amoxicillin-Pot Clavulanate Hives and Swelling    To lips.   Aspirin Hives   Atorvastatin Swelling     joint pain/swelling, inc liver tests   Clopidogrel Bisulfate Hives   Codeine Nausea And Vomiting   Ezetimibe Other (See Comments)     fatigue   Metformin And Related Other (See Comments)    Diarrhea    Valsartan Other (See Comments)     fatigue   Jardiance [Empagliflozin]     Did not tolerate   Other Hives    **Red Meat**  SOB   Current Outpatient Medications on File Prior to Visit  Medication Sig Dispense Refill   amLODipine (NORVASC) 2.5 MG tablet Take 1 tablet (2.5 mg) by mouth once daily 90 tablet 3   apixaban (ELIQUIS) 5 MG TABS tablet TAKE 1  TABLET(5 MG) BY MOUTH TWICE DAILY 60 tablet 5   blood glucose meter kit and supplies Dispense based on patient and insurance preference. Use up to four times daily as directed. (FOR ICD-10 E10.9, E11.9). 1 each 0   Blood Glucose Monitoring Suppl (ONE TOUCH ULTRA 2) w/Device KIT Use to check blood sugar once daily 1 each 0   Cyanocobalamin (VITAMIN B-12 PO) Take 1 tablet by mouth daily.     diphenhydrAMINE (BENADRYL) 25 MG tablet Take 50 mg by mouth  every 8 (eight) hours as needed for allergies.      EPINEPHrine (EPI-PEN) 0.3 mg/0.3 mL DEVI Inject 0.3 mg into the muscle daily as needed (allergic reaction).     fluticasone (FLONASE) 50 MCG/ACT nasal spray Place 2 sprays into both nostrils 2 (two) times daily as needed. Reported on 09/10/2015     glipiZIDE (GLUCOTROL XL) 10 MG 24 hr tablet Take 1 tablet (10 mg total) by mouth daily with breakfast. 1 tablet 0   glucose blood (ONETOUCH ULTRA) test strip USE TO CHECK BLOOD SUGAR ONCE DAILY AS DIRECTED 100 strip 1   isosorbide mononitrate (IMDUR) 60 MG 24 hr tablet TAKE 1 TABLET(60 MG) BY MOUTH DAILY 90 tablet 3   metFORMIN (GLUCOPHAGE-XR) 500 MG 24 hr tablet TAKE 1 TABLET(500 MG) BY MOUTH DAILY WITH BREAKFAST 90 tablet 0   nitroGLYCERIN (NITROSTAT) 0.4 MG SL tablet Place 1 tablet (0.4 mg total) under the tongue every 5 (five) minutes as needed for chest pain (up to 3 doses. If taking 3rd dose call 911). 25 tablet 3   propranolol (INDERAL) 10 MG tablet Take 1 tablet (10 mg) by mouth every 1 hour x 3 doses as needed for chest pain/ fast heart rates 30 tablet 1   rosuvastatin (CRESTOR) 5 MG tablet Take 1 tablet (5 mg total) by mouth every Monday, Wednesday, and Friday. 36 tablet 3   Current Facility-Administered Medications on File Prior to Visit  Medication Dose Route Frequency Provider Last Rate Last Admin   Study - ORION 4 - inclisiran 300 mg/1.60mL or placebo SQ injection (PI-Stuckey)  300 mg Subcutaneous Q6 months Herby Abraham, MD        Review of Systems  Constitutional:  Positive for fatigue. Negative for activity change, appetite change, fever and unexpected weight change.  HENT:  Negative for congestion, ear pain, rhinorrhea, sinus pressure and sore throat.   Eyes:  Negative for pain, redness and visual disturbance.  Respiratory:  Negative for cough, shortness of breath and wheezing.   Cardiovascular:  Negative for chest pain and palpitations.  Gastrointestinal:  Negative for abdominal  pain, blood in stool, constipation and diarrhea.  Endocrine: Negative for polydipsia and polyuria.  Genitourinary:  Negative for dysuria, frequency and urgency.  Musculoskeletal:  Positive for arthralgias. Negative for back pain and myalgias.       No longer has back pain  Skin:  Negative for pallor and rash.  Allergic/Immunologic: Negative for environmental allergies.  Neurological:  Negative for dizziness, syncope and headaches.       Occ tingling in L hand when in certain positions   Hematological:  Negative for adenopathy. Does not bruise/bleed easily.  Psychiatric/Behavioral:  Negative for decreased concentration and dysphoric mood. The patient is not nervous/anxious.        Objective:   Physical Exam Constitutional:      General: She is not in acute distress.    Appearance: Normal appearance. She is well-developed and normal weight. She is not ill-appearing or  diaphoretic.  HENT:     Head: Normocephalic and atraumatic.  Eyes:     General: No scleral icterus.    Conjunctiva/sclera: Conjunctivae normal.     Pupils: Pupils are equal, round, and reactive to light.  Neck:     Thyroid: No thyromegaly.     Vascular: No carotid bruit or JVD.  Cardiovascular:     Rate and Rhythm: Normal rate and regular rhythm.     Heart sounds: Normal heart sounds.     No gallop.  Pulmonary:     Effort: Pulmonary effort is normal. No respiratory distress.     Breath sounds: Normal breath sounds. No wheezing or rales.  Abdominal:     General: There is no distension or abdominal bruit.     Palpations: Abdomen is soft.  Musculoskeletal:     Cervical back: Normal range of motion and neck supple.     Right lower leg: No edema.     Left lower leg: No edema.     Comments: No spinous proces tendenress Normal rom Normal gait  No acute neuro changes   Lymphadenopathy:     Cervical: No cervical adenopathy.  Skin:    General: Skin is warm and dry.     Coloration: Skin is not pale.     Findings:  No rash.  Neurological:     Mental Status: She is alert.     Cranial Nerves: No cranial nerve deficit.     Motor: No weakness.     Coordination: Coordination normal.     Gait: Gait normal.     Deep Tendon Reflexes: Reflexes are normal and symmetric. Reflexes normal.  Psychiatric:        Mood and Affect: Mood normal.           Assessment & Plan:   Problem List Items Addressed This Visit       Cardiovascular and Mediastinum   Type 2 diabetes mellitus with diabetic peripheral angiopathy without gangrene, without long-term current use of insulin (HCC)    Lab Results  Component Value Date   HGBA1C 6.1 (A) 10/13/2022  Eating well  Continues metformin xr 500 mg daily and glipizide xl 10mg  daily  Watches for low glucose readings       Essential hypertension    bp in fair control at this time  BP Readings from Last 1 Encounters:  12/17/22 122/60  This is usually lower at home and also in cardiology office  Most recent labs reviewed as well as cardiology note Disc lifstyle change with low sodium diet and exercise  Plans to continue Amlodipine 2.5 mg daily  imdur 60 mg daily  Prn propranolol No longer on diuretic so potasslim is stopped          Endocrine   Hyperlipidemia associated with type 2 diabetes mellitus (HCC)    Disc goals for lipids and reasons to control them Rev last labs with pt Rev low sat fat diet in detail Improved with LDL of 62 -improved Tolerating crestor 5 mg three days weekly now          Other   BACK PAIN, LUMBAR - Primary    Mid back pain/worse on R side (mid to low) Past h/o deg LS change on xray-reviewed film from 2021  Seen in ER on 6/2 after UC visit on 5/31 Reviewed hospital records, lab results and studies in detail   Clear ua and reassuring w/u incl CT abd/pelvis (no renal stones or msk changes) Was therapeutic  with once dose of oxycodone then lidocaine patches  Symptoms entirely resolved within 3 days Now rom is back  Discussed  possible of severe muscle spasm that is now better  Encouraged walking and stretching as tolerated  Encouraged to f/u if symptoms return

## 2022-12-17 NOTE — Patient Instructions (Addendum)
Stay off the potassium   Glad your back pain is improved Let us know if it gets worse!     Blood pressure is better today Glad the cholesterol is better now also   Take care of yourself

## 2022-12-17 NOTE — Assessment & Plan Note (Signed)
Lab Results  Component Value Date   HGBA1C 6.1 (A) 10/13/2022   Eating well  Continues metformin xr 500 mg daily and glipizide xl 10mg  daily  Watches for low glucose readings

## 2022-12-17 NOTE — Assessment & Plan Note (Signed)
bp in fair control at this time  BP Readings from Last 1 Encounters:  12/17/22 122/60  This is usually lower at home and also in cardiology office  Most recent labs reviewed as well as cardiology note Disc lifstyle change with low sodium diet and exercise  Plans to continue Amlodipine 2.5 mg daily  imdur 60 mg daily  Prn propranolol No longer on diuretic so potasslim is stopped

## 2022-12-25 ENCOUNTER — Ambulatory Visit: Payer: PPO | Admitting: Family

## 2022-12-26 ENCOUNTER — Encounter: Payer: Self-pay | Admitting: Family Medicine

## 2022-12-30 ENCOUNTER — Encounter: Payer: Self-pay | Admitting: *Deleted

## 2022-12-30 DIAGNOSIS — Z006 Encounter for examination for normal comparison and control in clinical research program: Secondary | ICD-10-CM

## 2022-12-30 NOTE — Research (Signed)
Spoke with patient for follow up  Patient doing well.  No changes in her meds.  No sae to report  Patient is no longer on drug she is being followed by phone and med record only.   Mercer Pod :) RN BSN  Clinical Research Nurse  Be strong and take heart, all you who hope in the [place]. ~ Psalm 31:24

## 2023-01-12 ENCOUNTER — Other Ambulatory Visit (INDEPENDENT_AMBULATORY_CARE_PROVIDER_SITE_OTHER): Payer: PPO

## 2023-01-12 ENCOUNTER — Telehealth: Payer: Self-pay | Admitting: Family Medicine

## 2023-01-12 DIAGNOSIS — E1151 Type 2 diabetes mellitus with diabetic peripheral angiopathy without gangrene: Secondary | ICD-10-CM

## 2023-01-12 DIAGNOSIS — I10 Essential (primary) hypertension: Secondary | ICD-10-CM

## 2023-01-12 DIAGNOSIS — R7989 Other specified abnormal findings of blood chemistry: Secondary | ICD-10-CM

## 2023-01-12 DIAGNOSIS — E538 Deficiency of other specified B group vitamins: Secondary | ICD-10-CM

## 2023-01-12 LAB — CBC WITH DIFFERENTIAL/PLATELET
Basophils Absolute: 0.1 10*3/uL (ref 0.0–0.1)
Basophils Relative: 1.2 % (ref 0.0–3.0)
Eosinophils Absolute: 0.3 10*3/uL (ref 0.0–0.7)
Eosinophils Relative: 4.4 % (ref 0.0–5.0)
HCT: 43.6 % (ref 36.0–46.0)
Hemoglobin: 14 g/dL (ref 12.0–15.0)
Lymphocytes Relative: 37.1 % (ref 12.0–46.0)
Lymphs Abs: 2.2 10*3/uL (ref 0.7–4.0)
MCHC: 32.1 g/dL (ref 30.0–36.0)
MCV: 95.8 fl (ref 78.0–100.0)
Monocytes Absolute: 0.4 10*3/uL (ref 0.1–1.0)
Monocytes Relative: 7.2 % (ref 3.0–12.0)
Neutro Abs: 2.9 10*3/uL (ref 1.4–7.7)
Neutrophils Relative %: 50.1 % (ref 43.0–77.0)
Platelets: 211 10*3/uL (ref 150.0–400.0)
RBC: 4.55 Mil/uL (ref 3.87–5.11)
RDW: 13.5 % (ref 11.5–15.5)
WBC: 5.8 10*3/uL (ref 4.0–10.5)

## 2023-01-12 LAB — LIPID PANEL
Cholesterol: 155 mg/dL (ref 0–200)
HDL: 44.8 mg/dL (ref >39.00)
LDL Cholesterol: 87 mg/dL (ref 0–99)
NonHDL: 110.69
Total CHOL/HDL Ratio: 3
Triglycerides: 117 mg/dL (ref 0.0–149.0)
VLDL: 23.4 mg/dL (ref 0.0–40.0)

## 2023-01-12 LAB — COMPREHENSIVE METABOLIC PANEL
ALT: 9 U/L (ref 0–35)
AST: 13 U/L (ref 0–37)
Albumin: 3.9 g/dL (ref 3.5–5.2)
Alkaline Phosphatase: 66 U/L (ref 39–117)
BUN: 13 mg/dL (ref 6–23)
CO2: 30 mEq/L (ref 19–32)
Calcium: 9.3 mg/dL (ref 8.4–10.5)
Chloride: 105 mEq/L (ref 96–112)
Creatinine, Ser: 0.92 mg/dL (ref 0.40–1.20)
GFR: 56.87 mL/min — ABNORMAL LOW (ref >60.00)
Glucose, Bld: 78 mg/dL (ref 70–99)
Potassium: 4.4 mEq/L (ref 3.5–5.1)
Sodium: 142 mEq/L (ref 135–145)
Total Bilirubin: 0.5 mg/dL (ref 0.2–1.2)
Total Protein: 6.4 g/dL (ref 6.0–8.3)

## 2023-01-12 LAB — T4, FREE: Free T4: 0.65 ng/dL (ref 0.60–1.60)

## 2023-01-12 LAB — MICROALBUMIN / CREATININE URINE RATIO
Creatinine,U: 79.3 mg/dL
Microalb Creat Ratio: 0.9 mg/g (ref 0.0–30.0)
Microalb, Ur: <0.7 mg/dL (ref 0.0–1.9)

## 2023-01-12 LAB — HEMOGLOBIN A1C: Hgb A1c MFr Bld: 6.6 % — ABNORMAL HIGH (ref 4.6–6.5)

## 2023-01-12 LAB — VITAMIN B12: Vitamin B-12: 732 pg/mL (ref 211–911)

## 2023-01-12 LAB — TSH: TSH: 5.02 u[IU]/mL (ref 0.35–5.50)

## 2023-01-12 NOTE — Telephone Encounter (Signed)
-----   Message from Lovena Neighbours, RT sent at 01/01/2023 10:49 AM EDT ----- Regarding: Labs for 7.8.24 Patient is scheduled for CPX labs, please order future labs, Thanks , Camelia Eng

## 2023-01-13 ENCOUNTER — Other Ambulatory Visit: Payer: Self-pay | Admitting: Family Medicine

## 2023-01-15 ENCOUNTER — Ambulatory Visit: Payer: PPO | Attending: Medical | Admitting: Internal Medicine

## 2023-01-15 ENCOUNTER — Encounter: Payer: Self-pay | Admitting: Internal Medicine

## 2023-01-15 VITALS — BP 120/60 | HR 58 | Ht 68.0 in | Wt 191.0 lb

## 2023-01-15 DIAGNOSIS — I480 Paroxysmal atrial fibrillation: Secondary | ICD-10-CM | POA: Diagnosis not present

## 2023-01-15 MED ORDER — FUROSEMIDE 20 MG PO TABS: 20.0000 mg | ORAL_TABLET | ORAL | 3 refills | Status: AC

## 2023-01-15 NOTE — Patient Instructions (Signed)
Medication Instructions:  Take Lasix 20 mg every other day   *If you need a refill on your cardiac medications before your next appointment, please call your pharmacy*  Follow-Up: At [facility], you and your health needs are our priority.  As part of our continuing mission to provide you with exceptional heart care, we have created designated Provider Care Teams.  These Care Teams include your primary Cardiologist (physician) and Advanced Practice Providers (APPs -  Physician Assistants and Nurse Practitioners) who all work together to provide you with the care you need, when you need it.  We recommend signing up for the patient portal called "MyChart".  Sign up information is provided on this After Visit Summary.  MyChart is used to connect with patients for Virtual Visits (Telemedicine).  Patients are able to view lab/test results, encounter notes, upcoming appointments, etc.  Non-urgent messages can be sent to your provider as well.   To learn more about what you can do with MyChart, go to [email or web address].    Your next appointment:   6 month(s)  Provider:   Sherryl Manges, MD

## 2023-01-15 NOTE — Progress Notes (Signed)
Patient Care Team: Tower, Audrie Gallus, MD as PCP - General (Family Medicine) Duke Salvia, MD as PCP - Cardiology (Cardiology) Duke Salvia, MD as PCP - Electrophysiology (Cardiology) Dasher, Cliffton Asters, MD as Consulting Physician (Dermatology) Myrene Galas., MD as Referring Physician (Dentistry) Lockie Mola, MD as Referring Physician (Ophthalmology)   HPI  Margaret Vazquez is a 87 y.o. female seen in follow-up for chronotropic incompetence and exercise intolerance.  Peak heart rate in the office was 73.  Has been deferring a discussion of pacing for almost a year.  Undertook modified treadmill 8/22 heart rate 64--104  8/14 loop recorder implanted for cryptogenic stroke.  Atrial fibrillation identified now on anticoagulation with apixaban.  No significant  bleeding   2019 coronary artery disease-CT>> abnormal FFR; circumflex was stented     The patient denies chest pain, nocturnal dyspnea, orthopnea or peripheral edema.  There have been no palpitation, lightheadedness or syncope.  Complains of dyspnea on exertion but much improved.  Chest pain largely resolved with introduction of amlodipine.  She has tolerated the low-dose Crestor that we started last time.Marland Kitchen         DATE TEST EF   6/19 LHC 55-65% CXp/m-80>>stent RCAm 30%  10/21 Echo   65-70 %   2/23 Myoview hyperdynamic No ischemia  1/24 Echo  60-65%    Date Cr K TSH Hgb LDL  9/21 0.98 3.9  15.0    3/22 0.9 4.2 5.14 14.3   10/22 1.1 4.0 2.21    4/23 0.99 4.1 3.02 14.2 110  7/24 0.92 4.4 5.02 14.0    Thromboembolic risk factors ( age  -2, HTN-1, TIA/CVA-2, DM-1Vasc disease -1, Gender-1) for a CHADSVASc Score of >=8      Past Medical History:  Diagnosis Date   Allergy history, drug    Aspirin   Basal cell carcinoma    "back"   CAD (coronary artery disease)    a. Previously nonobstructive then progressive angina with abnl CT -> cardiac cath 12/24/17 showed 30% mid RCA and 80% prox-mid Cx. She  received DES to mid AV groove Cx. EF 55-65%.    Cervical stenosis of spine    With neck pain   CKD (chronic kidney disease), stage II    Colon polyps 2009   Complication of anesthesia 1980s   slow to wake after anesthesia "when I had breast biopsy"   GERD (gastroesophageal reflux disease)    Hyperlipidemia    myalgias with Lipitor and Zetia   Hypertension    Migraine    "stopped at age 67" (12/24/2017)   Squamous carcinoma    "iced off and cut off; mostly arms" (12/24/2017)   Stroke [facility])    "told me I'd had 2 strokes in 02/2013"; denies residual on 12/24/2017   TIA (transient ischemic attack)    Type II diabetes mellitus (HCC)     Past Surgical History:  Procedure Laterality Date   ABD [place]  07/2003   Negative   APPENDECTOMY     BASAL CELL CARCINOMA EXCISION     "back"   BREAST BIOPSY Left 1992   benign   CARDIAC CATHETERIZATION  04/07/2011   non obst CAD (Dr Excell Seltzer)   CATARACT EXTRACTION W/PHACO Left 04/01/2017   Procedure: CATARACT EXTRACTION PHACO AND INTRAOCULAR LENS PLACEMENT (IOC) LEFT DIABETIC;  Surgeon: Lockie Mola, MD;  Location: [facility];  Service: Ophthalmology;  Laterality: Left;  Diabetic - oral meds   CATARACT EXTRACTION W/PHACO  Right 04/29/2017   Procedure: CATARACT EXTRACTION PHACO AND INTRAOCULAR LENS PLACEMENT (IOC) RIGHT DIABETIC;  Surgeon: Lockie Mola, MD;  Location: [facility];  Service: Ophthalmology;  Laterality: Right;  Diabetic - oral meds   COLONOSCOPY  12/2007   Adenomatous colon polyps   CORONARY ANGIOPLASTY WITH STENT PLACEMENT  12/24/2017   CORONARY STENT INTERVENTION N/A 12/24/2017   Procedure: CORONARY STENT INTERVENTION;  Surgeon: Runell Gess, MD;  Location: [facility];  Service: Cardiovascular;  Laterality: N/A;   CYSTOSCOPY W/ DECANNULATION  03/2000   Normal   LEFT HEART CATH AND CORONARY ANGIOGRAPHY N/A 12/24/2017   Procedure: LEFT HEART CATH AND CORONARY ANGIOGRAPHY;  Surgeon: Runell Gess, MD;  Location: [facility] CV [facility];  Service: Cardiovascular;  Laterality: N/A;   LOOP RECORDER IMPLANT N/A 02/16/2013   Procedure: LOOP RECORDER IMPLANT;  Surgeon: Duke Salvia, MD;  Location: [facility];  Service: Cardiovascular;  Laterality: N/A;   MOHS SURGERY     right hand    NASAL SINUS SURGERY  01/2005   SQUAMOUS CELL CARCINOMA EXCISION     "mostly arms;" (12/24/2017)   STRESS CARDIOLITE  11/1999   Normal/ negative   TEAR DUCT PROBING  2005   "? side"   TEE WITHOUT CARDIOVERSION N/A 02/16/2013   Procedure: TRANSESOPHAGEAL ECHOCARDIOGRAM (TEE);  Surgeon: Wendall Stade, MD;  Location: [facility] ENDOSCOPY;  Service: Cardiovascular;  Laterality: N/A;   TUBAL LIGATION     BTL    Current Outpatient Medications  Medication Sig Dispense Refill   amLODipine (NORVASC) 2.5 MG tablet Take 1 tablet (2.5 mg) by mouth once daily 90 tablet 3   apixaban (ELIQUIS) 5 MG TABS tablet TAKE 1 TABLET(5 MG) BY MOUTH TWICE DAILY 60 tablet 5   blood glucose meter kit and supplies Dispense based on patient and insurance preference. Use up to four times daily as directed. (FOR ICD-10 E10.9, E11.9). 1 each 0   Blood Glucose Monitoring Suppl (ONE TOUCH ULTRA 2) w/Device KIT Use to check blood sugar once daily 1 each 0   Cyanocobalamin (VITAMIN B-12 PO) Take 1 tablet by mouth daily.     diphenhydrAMINE (BENADRYL) 25 MG tablet Take 50 mg by mouth every 8 (eight) hours as needed for allergies.      EPINEPHrine (EPI-PEN) 0.3 mg/0.3 mL DEVI Inject 0.3 mg into the muscle daily as needed (allergic reaction).     fluticasone (FLONASE) 50 MCG/ACT nasal spray Place 2 sprays into both nostrils 2 (two) times daily as needed. Reported on 09/10/2015     glipiZIDE (GLUCOTROL XL) 10 MG 24 hr tablet TAKE 1 TABLET(10 MG) BY MOUTH DAILY WITH BREAKFAST 90 tablet 1   glucose blood (ONETOUCH ULTRA) test strip USE TO CHECK BLOOD SUGAR ONCE DAILY AS DIRECTED 100 strip 1   isosorbide mononitrate (IMDUR) 60 MG 24 hr tablet TAKE 1 TABLET(60  MG) BY MOUTH DAILY 90 tablet 3   metFORMIN (GLUCOPHAGE-XR) 500 MG 24 hr tablet TAKE 1 TABLET(500 MG) BY MOUTH DAILY WITH BREAKFAST 90 tablet 0   nitroGLYCERIN (NITROSTAT) 0.4 MG SL tablet Place 1 tablet (0.4 mg total) under the tongue every 5 (five) minutes as needed for chest pain (up to 3 doses. If taking 3rd dose call 911). 25 tablet 3   propranolol (INDERAL) 10 MG tablet Take 1 tablet (10 mg) by mouth every 1 hour x 3 doses as needed for chest pain/ fast heart rates 30 tablet 1   rosuvastatin (CRESTOR) 5 MG tablet Take  1 tablet (5 mg total) by mouth every Monday, Wednesday, and Friday. 36 tablet 3   Current Facility-Administered Medications  Medication Dose Route Frequency Provider Last Rate Last Admin   Study - ORION 4 - inclisiran 300 mg/1.45mL or placebo SQ injection (PI-Stuckey)  300 mg Subcutaneous Q6 months Herby Abraham, MD        Allergies  Allergen Reactions   Bee Venom Hives, Shortness Of Breath and Swelling   Nabumetone Anaphylaxis   Amoxicillin-Pot Clavulanate Hives and Swelling    To lips.   Aspirin Hives   Atorvastatin Swelling     joint pain/swelling, inc liver tests   Clopidogrel Bisulfate Hives   Codeine Nausea And Vomiting   Ezetimibe Other (See Comments)     fatigue   Metformin And Related Other (See Comments)    Diarrhea    Valsartan Other (See Comments)     fatigue   Jardiance [Empagliflozin]     Did not tolerate   Other Hives    **Red Meat**  SOB    Review of Systems negative except from HPI and PMH  Physical Exam BP 120/60   Pulse (!) 58   Ht 5\' 8"  (1.727 m)   Wt 191 lb (86.6 kg)   SpO2 94%   BMI 29.04 kg/m  Well developed and nourished in no acute distress HENT normal Neck supple with JVP-  flat 6-7 Clear Regular rate and rhythm, no murmurs or gallops Abd-soft with active BS No Clubbing cyanosis  tredema Skin-warm and dry A & Oriented  Grossly normal sensory and motor function  ECG sinus at 58 Interval 18/08/40  Assessment and   Plan  Cryptogenic stroke  Implantable loop recorder end of service  Elevated blood pressure  Coronary disease s/p Cx stenting  Dyspnea on exertion  Exertional chest pain consistent with angina-persistent  Atrial fibrillation paroxysmal   Cholesterol-lowering therapy intolerant (statins, ezetimibe, PCSK9)  Sinus bradycardia w chronotropic incompetence  Dyspnea on exertion persists.  Multifactorial.  Chronotropic incompetence, HFpEF are the likely dominant contributors.  She has declined pacing in the past, we will have her discuss with her primary care doctor whether she be a candidate for [place], having not tolerated Jardiance.  Will put her on a low-dose diuretic furosemide 20 mg q. OD.  Blood pressure is well-controlled as his ischemia on the combination of nitrates amlodipine as needed beta-blockers  No atrial fibrillation of which she is aware.  No bleeding.  Continue Eliquis 5 twice daily dosed for weight and renal function

## 2023-01-19 ENCOUNTER — Encounter: Payer: Self-pay | Admitting: Family Medicine

## 2023-01-19 ENCOUNTER — Ambulatory Visit (INDEPENDENT_AMBULATORY_CARE_PROVIDER_SITE_OTHER): Payer: PPO | Admitting: Family Medicine

## 2023-01-19 VITALS — BP 128/60 | HR 60 | Temp 97.5°F | Ht 67.25 in | Wt 189.1 lb

## 2023-01-19 DIAGNOSIS — E1151 Type 2 diabetes mellitus with diabetic peripheral angiopathy without gangrene: Secondary | ICD-10-CM | POA: Diagnosis not present

## 2023-01-19 DIAGNOSIS — E785 Hyperlipidemia, unspecified: Secondary | ICD-10-CM

## 2023-01-19 DIAGNOSIS — R7989 Other specified abnormal findings of blood chemistry: Secondary | ICD-10-CM | POA: Diagnosis not present

## 2023-01-19 DIAGNOSIS — I10 Essential (primary) hypertension: Secondary | ICD-10-CM

## 2023-01-19 DIAGNOSIS — E1169 Type 2 diabetes mellitus with other specified complication: Secondary | ICD-10-CM

## 2023-01-19 DIAGNOSIS — I480 Paroxysmal atrial fibrillation: Secondary | ICD-10-CM | POA: Diagnosis not present

## 2023-01-19 DIAGNOSIS — E538 Deficiency of other specified B group vitamins: Secondary | ICD-10-CM

## 2023-01-19 DIAGNOSIS — Z0000 Encounter for general adult medical examination without abnormal findings: Secondary | ICD-10-CM

## 2023-01-19 DIAGNOSIS — I2510 Atherosclerotic heart disease of native coronary artery without angina pectoris: Secondary | ICD-10-CM

## 2023-01-19 MED ORDER — DAPAGLIFLOZIN PROPANEDIOL 5 MG PO TABS
5.0000 mg | ORAL_TABLET | Freq: Every day | ORAL | 1 refills | Status: DC
Start: 2023-01-19 — End: 2023-02-13

## 2023-01-19 NOTE — Progress Notes (Signed)
Subjective:    Patient ID: Margaret Vazquez, female    DOB: 1935/11/26, 87 y.o.   MRN: [id number]  HPI  Here for health maintenance exam and to review chronic medical problems   Wt Readings from Last 3 Encounters:  01/19/23 189 lb 2 oz (85.8 kg)  01/15/23 191 lb (86.6 kg)  12/17/22 190 lb (86.2 kg)   29.40 kg/m  Vitals:   01/19/23 1434 01/19/23 1511  BP: (!) 144/78 128/60  Pulse: 60   Temp: (!) 97.5 F (36.4 C)   SpO2: 96%     Immunization History  Administered Date(s) Administered   Fluad Quad(high Dose 65+) 03/23/2019, 03/26/2020, 05/08/2021   Influenza Split 05/05/2011, 04/28/2012   Influenza Whole 04/24/2005, 06/03/2010   Influenza,inj,Quad PF,6+ Mos 05/16/2013, 09/13/2018   Influenza-Unspecified 04/01/2014   PFIZER(Purple Top)SARS-COV-2 Vaccination 09/19/2019, 10/10/2019, 06/08/2020   Pneumococcal Conjugate-13 08/22/2015   Pneumococcal Polysaccharide-23 12/03/2010   Td 11/27/1997, 10/28/2011    Health Maintenance Due  Topic Date Due   DTaP/Tdap/Td (3 - Tdap) 10/27/2021   Medicare Annual Wellness (AWV)  11/08/2022   Shingrix- declines/ scared of it   Tetanus shot - may get in pharmacy   Mammogram 06/2021 Is interested in screening  Self breast exam- no lumps    Colon cancer screening -out aged     Dexa  04/2022  Bmd in normal range  Falls- none Fractures-none  Supplements -none  Exercise - none / no time and knees bother her   Left foot and leg burn at times   Has dermatology appt for skin cancer treatment next week   Mood    11/07/2021    9:06 AM 04/01/2021    2:15 PM 11/05/2020    2:28 PM 10/20/2019   10:11 AM 09/21/2019   10:13 AM  Depression screen PHQ 2/9  Decreased Interest 0 0 0 0 0  Down, Depressed, Hopeless 0 1 0 0 0  PHQ - 2 Score 0 1 0 0 0    HTN bp is stable today  No cp or palpitations or headaches or edema  No side effects to medicines  BP Readings from Last 3 Encounters:  01/19/23 128/60  01/15/23 120/60  12/17/22  122/60   Amlodipine 2.5 mg daily  imdur 60 mg daily  Prn propranolol  Pulse Readings from Last 3 Encounters:  01/19/23 60  01/15/23 (!) 58  12/17/22 66     Sees cardiology for CAD and a fib  SGLT2 was recommended for cardiac benefit  In past pt declined due to fear of urinary/vaginal side effects   Lab Results  Component Value Date   TSH 5.02 01/12/2023   FT4 normal at 0.65    DM2 Lab Results  Component Value Date   HGBA1C 6.6 (H) 01/12/2023   Metformin xr 500 mg daily  Glipizide xl 10 mg daily   Eye exam utd Lab Results  Component Value Date   MICROALBUR <0.7 01/12/2023   MICROALBUR 0.8 10/31/2021     Hyperlipidemia Lab Results  Component Value Date   CHOL 155 01/12/2023   CHOL 138 12/10/2022   CHOL 215 (H) 07/28/2022   Lab Results  Component Value Date   HDL 44.80 01/12/2023   HDL 47 12/10/2022   HDL 53 07/28/2022   Lab Results  Component Value Date   LDLCALC 87 01/12/2023   LDLCALC 62 12/10/2022   LDLCALC 127 (H) 07/28/2022   Lab Results  Component Value Date   TRIG 117.0 01/12/2023   TRIG  144 12/10/2022   TRIG 173 (H) 07/28/2022   Lab Results  Component Value Date   CHOLHDL 3 01/12/2023   CHOLHDL 2.9 12/10/2022   CHOLHDL 4.1 07/28/2022   Lab Results  Component Value Date   LDLDIRECT 119.0 05/01/2021   LDLDIRECT 107.0 08/27/2016   LDLDIRECT 136.5 10/20/2011   Crestor 5 mg every other day (3 days per week)  LDL is up a bit    Vit B12 def Lab Results  Component Value Date   VITAMINB12 732 01/12/2023   Oral supplementation   Lab Results  Component Value Date   WBC 5.8 01/12/2023   HGB 14.0 01/12/2023   HCT 43.6 01/12/2023   MCV 95.8 01/12/2023   PLT 211.0 01/12/2023   Lab Results  Component Value Date   NA 142 01/12/2023   K 4.4 01/12/2023   CO2 30 01/12/2023   GLUCOSE 78 01/12/2023   BUN 13 01/12/2023   CREATININE 0.92 01/12/2023   CALCIUM 9.3 01/12/2023   GFR 56.87 (L) 01/12/2023   GFRNONAA >60 12/07/2022    Lab Results  Component Value Date   ALT 9 01/12/2023   AST 13 01/12/2023   ALKPHOS 66 01/12/2023   BILITOT 0.5 01/12/2023     Patient Active Problem List   Diagnosis Date Noted   Type II diabetes mellitus (HCC)    Estrogen deficiency 11/07/2021   Grief reaction 05/08/2021   Elevated TSH 11/05/2020   History of loop recorder 09/06/2019   History of seizure 03/23/2019   Left-sided epistaxis 11/17/2018   Medicare annual wellness visit, subsequent 09/13/2018   Screening mammogram, encounter for 09/13/2018   Herpes zoster 08/10/2018   Coronary artery disease 12/24/2017   Routine general medical examination at a health care facility 08/24/2016   PAF (paroxysmal atrial fibrillation) (HCC) 04/07/2016   Overweight (BMI 25.0-29.9) 08/23/2014   Type 2 diabetes mellitus with diabetic peripheral angiopathy without gangrene, without long-term current use of insulin (HCC) 01/12/2014   History of CVA (cerebrovascular accident) 01/11/2014   LOOP Recorder LINQ 06/07/2013   Sinusitis, chronic 05/16/2013   H/O: CVA (cerebrovascular accident) 02/15/2013   Encounter for Medicare annual wellness exam 10/27/2012   Encopresis(307.7) 12/15/2011   Post-menopausal 10/28/2011   PERSONAL HX COLONIC POLYPS 09/04/2009   B12 deficiency 03/06/2009   ALLERGIC RHINITIS 02/14/2008   BACK PAIN, LUMBAR 11/03/2007   Hyperlipidemia associated with type 2 diabetes mellitus (HCC) 11/24/2006   Essential hypertension 11/24/2006   GERD 10/09/2006   OVERACTIVE BLADDER 10/09/2006   INCONTINENCE, URGE 10/09/2006   SKIN CANCER, HX OF 10/09/2006   Past Medical History:  Diagnosis Date   Allergy history, drug    Aspirin   Basal cell carcinoma    "back"   CAD (coronary artery disease)    a. Previously nonobstructive then progressive angina with abnl CT -> cardiac cath 12/24/17 showed 30% mid RCA and 80% prox-mid Cx. She received DES to mid AV groove Cx. EF 55-65%.    Cervical stenosis of spine    With neck pain    CKD (chronic kidney disease), stage II    Colon polyps 2009   Complication of anesthesia 1980s   slow to wake after anesthesia "when I had breast biopsy"   GERD (gastroesophageal reflux disease)    Hyperlipidemia    myalgias with Lipitor and Zetia   Hypertension    Migraine    "stopped at age 26" (12/24/2017)   Squamous carcinoma    "iced off and cut off; mostly arms" (12/24/2017)  Stroke [facility])    "told me I'd had 2 strokes in 02/2013"; denies residual on 12/24/2017   TIA (transient ischemic attack)    Type II diabetes mellitus [facility])    Past Surgical History:  Procedure Laterality Date   ABD [place]  07/2003   Negative   APPENDECTOMY     BASAL CELL CARCINOMA EXCISION     "back"   BREAST BIOPSY Left 1992   benign   CARDIAC CATHETERIZATION  04/07/2011   non obst CAD (Dr Excell Seltzer)   CATARACT EXTRACTION W/PHACO Left 04/01/2017   Procedure: CATARACT EXTRACTION PHACO AND INTRAOCULAR LENS PLACEMENT (IOC) LEFT DIABETIC;  Surgeon: Lockie Mola, MD;  Location: [facility];  Service: Ophthalmology;  Laterality: Left;  Diabetic - oral meds   CATARACT EXTRACTION W/PHACO Right 04/29/2017   Procedure: CATARACT EXTRACTION PHACO AND INTRAOCULAR LENS PLACEMENT (IOC) RIGHT DIABETIC;  Surgeon: Lockie Mola, MD;  Location: [facility];  Service: Ophthalmology;  Laterality: Right;  Diabetic - oral meds   COLONOSCOPY  12/2007   Adenomatous colon polyps   CORONARY ANGIOPLASTY WITH STENT PLACEMENT  12/24/2017   CORONARY STENT INTERVENTION N/A 12/24/2017   Procedure: CORONARY STENT INTERVENTION;  Surgeon: Runell Gess, MD;  Location: [facility] INVASIVE CV LAB;  Service: Cardiovascular;  Laterality: N/A;   CYSTOSCOPY W/ DECANNULATION  03/2000   Normal   LEFT HEART CATH AND CORONARY ANGIOGRAPHY N/A 12/24/2017   Procedure: LEFT HEART CATH AND CORONARY ANGIOGRAPHY;  Surgeon: Runell Gess, MD;  Location: [facility] INVASIVE CV LAB;  Service: Cardiovascular;  Laterality: N/A;   LOOP RECORDER  IMPLANT N/A 02/16/2013   Procedure: LOOP RECORDER IMPLANT;  Surgeon: Duke Salvia, MD;  Location: [facility] CATH LAB;  Service: Cardiovascular;  Laterality: N/A;   MOHS SURGERY     right hand    NASAL SINUS SURGERY  01/2005   SQUAMOUS CELL CARCINOMA EXCISION     "mostly arms;" (12/24/2017)   STRESS CARDIOLITE  11/1999   Normal/ negative   TEAR DUCT PROBING  2005   "? side"   TEE WITHOUT CARDIOVERSION N/A 02/16/2013   Procedure: TRANSESOPHAGEAL ECHOCARDIOGRAM (TEE);  Surgeon: Wendall Stade, MD;  Location: [facility] ENDOSCOPY;  Service: Cardiovascular;  Laterality: N/A;   TUBAL LIGATION     BTL   Social History   Tobacco Use   Smoking status: Never   Smokeless tobacco: Never  Vaping Use   Vaping status: Never Used  Substance Use Topics   Alcohol use: Never    Alcohol/week: 0.0 standard drinks of alcohol   Drug use: Never   Family History  Problem Relation Age of Onset   Lung cancer Brother    Diabetes Brother    Pancreatic cancer Brother    Heart disease Mother    Heart disease Father    Brain cancer Other    Skin cancer Daughter    Diabetes Sister    Breast cancer Sister    Colon cancer Neg Hx    Allergies  Allergen Reactions   Bee Venom Hives, Shortness Of Breath and Swelling   Nabumetone Anaphylaxis   Amoxicillin-Pot Clavulanate Hives and Swelling    To lips.   Aspirin Hives   Atorvastatin Swelling     joint pain/swelling, inc liver tests   Clopidogrel Bisulfate Hives   Codeine Nausea And Vomiting   Ezetimibe Other (See Comments)     fatigue   Metformin And Related Other (See Comments)    Diarrhea    Valsartan Other (See Comments)  fatigue   Jardiance [Empagliflozin]     Did not tolerate   Other Hives    **Red Meat**  SOB   Current Outpatient Medications on File Prior to Visit  Medication Sig Dispense Refill   amLODipine (NORVASC) 2.5 MG tablet Take 1 tablet (2.5 mg) by mouth once daily 90 tablet 3   apixaban (ELIQUIS) 5 MG TABS tablet TAKE 1 TABLET(5 MG) BY  MOUTH TWICE DAILY 60 tablet 5   blood glucose meter kit and supplies Dispense based on patient and insurance preference. Use up to four times daily as directed. (FOR ICD-10 E10.9, E11.9). 1 each 0   Blood Glucose Monitoring Suppl (ONE TOUCH ULTRA 2) w/Device KIT Use to check blood sugar once daily 1 each 0   Cyanocobalamin (VITAMIN B-12 PO) Take 1 tablet by mouth daily.     diphenhydrAMINE (BENADRYL) 25 MG tablet Take 50 mg by mouth every 8 (eight) hours as needed for allergies.      EPINEPHrine (EPI-PEN) 0.3 mg/0.3 mL DEVI Inject 0.3 mg into the muscle daily as needed (allergic reaction).     fluticasone (FLONASE) 50 MCG/ACT nasal spray Place 2 sprays into both nostrils 2 (two) times daily as needed. Reported on 09/10/2015     furosemide (LASIX) 20 MG tablet Take 1 tablet (20 mg total) by mouth every other day. 90 tablet 3   glucose blood (ONETOUCH ULTRA) test strip USE TO CHECK BLOOD SUGAR ONCE DAILY AS DIRECTED 100 strip 1   isosorbide mononitrate (IMDUR) 60 MG 24 hr tablet TAKE 1 TABLET(60 MG) BY MOUTH DAILY 90 tablet 3   metFORMIN (GLUCOPHAGE-XR) 500 MG 24 hr tablet TAKE 1 TABLET(500 MG) BY MOUTH DAILY WITH BREAKFAST 90 tablet 0   nitroGLYCERIN (NITROSTAT) 0.4 MG SL tablet Place 1 tablet (0.4 mg total) under the tongue every 5 (five) minutes as needed for chest pain (up to 3 doses. If taking 3rd dose call 911). 25 tablet 3   propranolol (INDERAL) 10 MG tablet Take 1 tablet (10 mg) by mouth every 1 hour x 3 doses as needed for chest pain/ fast heart rates 30 tablet 1   rosuvastatin (CRESTOR) 5 MG tablet Take 1 tablet (5 mg total) by mouth every Monday, Wednesday, and Friday. 36 tablet 3   Current Facility-Administered Medications on File Prior to Visit  Medication Dose Route Frequency Provider Last Rate Last Admin   Study - ORION 4 - inclisiran 300 mg/1.50mL or placebo SQ injection (PI-Stuckey)  300 mg Subcutaneous Q6 months Herby Abraham, MD        Review of Systems  Constitutional:   Negative for activity change, appetite change, fatigue, fever and unexpected weight change.  HENT:  Negative for congestion, ear pain, rhinorrhea, sinus pressure and sore throat.   Eyes:  Negative for pain, redness and visual disturbance.  Respiratory:  Negative for cough, shortness of breath and wheezing.   Cardiovascular:  Negative for chest pain and palpitations.  Gastrointestinal:  Negative for abdominal pain, blood in stool, constipation and diarrhea.  Endocrine: Negative for polydipsia and polyuria.  Genitourinary:  Positive for frequency. Negative for dysuria and urgency.  Musculoskeletal:  Positive for arthralgias and back pain. Negative for myalgias.  Skin:  Negative for pallor and rash.  Allergic/Immunologic: Negative for environmental allergies.  Neurological:  Negative for dizziness, syncope and headaches.  Hematological:  Negative for adenopathy. Does not bruise/bleed easily.  Psychiatric/Behavioral:  Negative for decreased concentration and dysphoric mood. The patient is not nervous/anxious.  Objective:   Physical Exam Constitutional:      General: She is not in acute distress.    Appearance: Normal appearance. She is well-developed. She is obese. She is not ill-appearing or diaphoretic.  HENT:     Head: Normocephalic and atraumatic.     Right Ear: Tympanic membrane, ear canal and external ear normal.     Left Ear: Tympanic membrane, ear canal and external ear normal.     Nose: Nose normal. No congestion.     Mouth/Throat:     Mouth: Mucous membranes are moist.     Pharynx: Oropharynx is clear. No posterior oropharyngeal erythema.  Eyes:     General: No scleral icterus.    Extraocular Movements: Extraocular movements intact.     Conjunctiva/sclera: Conjunctivae normal.     Pupils: Pupils are equal, round, and reactive to light.  Neck:     Thyroid: No thyromegaly.     Vascular: No carotid bruit or JVD.  Cardiovascular:     Rate and Rhythm: Normal rate and  regular rhythm.     Pulses: Normal pulses.     Heart sounds: Normal heart sounds.     No gallop.  Pulmonary:     Effort: Pulmonary effort is normal. No respiratory distress.     Breath sounds: Normal breath sounds. No wheezing.     Comments: Good air exch Chest:     Chest wall: No tenderness.  Abdominal:     General: Bowel sounds are normal. There is no distension or abdominal bruit.     Palpations: Abdomen is soft. There is no mass.     Tenderness: There is no abdominal tenderness.     Hernia: No hernia is present.  Genitourinary:    Comments: Breast exam: No mass, nodules, thickening, tenderness, bulging, retraction, inflamation, nipple discharge or skin changes noted.  No axillary or clavicular LA.     Musculoskeletal:        General: No tenderness. Normal range of motion.     Cervical back: Normal range of motion and neck supple. No rigidity. No muscular tenderness.     Right lower leg: No edema.     Left lower leg: No edema.     Comments: No kyphosis   Lymphadenopathy:     Cervical: No cervical adenopathy.  Skin:    General: Skin is warm and dry.     Coloration: Skin is not pale.     Findings: No erythema or rash.     Comments: Solar lentigines diffusely  Scattered sks   Neurological:     Mental Status: She is alert. Mental status is at baseline.     Cranial Nerves: No cranial nerve deficit.     Motor: No abnormal muscle tone.     Coordination: Coordination normal.     Gait: Gait normal.     Deep Tendon Reflexes: Reflexes are normal and symmetric.  Psychiatric:        Mood and Affect: Mood normal.        Cognition and Memory: Cognition and memory normal.           Assessment & Plan:   Problem List Items Addressed This Visit       Cardiovascular and Mediastinum   Type 2 diabetes mellitus with diabetic peripheral angiopathy without gangrene, without long-term current use of insulin (HCC)    Lab Results  Component Value Date   HGBA1C 6.6 (H) 01/12/2023    Continues metformin xr 500 mg daily  Open  to trying farxiga 5 mg daily (hold glipizide) for cardiac health  Given handout  Will call if side effects like uti or vaginal symptoms  Follow up 3 mo  Microalb utd On a statin  Eye exam utd      Relevant Medications   dapagliflozin propanediol (FARXIGA) 5 MG TABS tablet   PAF (paroxysmal atrial fibrillation) (HCC)    Continues cardiology care with eliquis and propranolol      Essential hypertension    bp in fair control at this time  BP Readings from Last 1 Encounters:  01/19/23 128/60  This is usually lower at home and also in cardiology office  Most recent labs reviewed as well as cardiology note Disc lifstyle change with low sodium diet and exercise  Plans to continue Amlodipine 2.5 mg daily  imdur 60 mg daily  Prn propranolol        Coronary artery disease     Endocrine   Hyperlipidemia associated with type 2 diabetes mellitus (HCC)    Disc goals for lipids and reasons to control them Rev last labs with pt Rev low sat fat diet in detail Improved with LDL of 87- increased  Tolerating crestor 5 mg three days weekly now        Relevant Medications   dapagliflozin propanediol (FARXIGA) 5 MG TABS tablet     Other   Routine general medical examination at a health care facility - Primary    Reviewed health habits including diet and exercise and skin cancer prevention Reviewed appropriate screening tests for age  Also reviewed health mt list, fam hx and immunization status , as well as social and family history   See HPI Labs reviewed and ordered Declines shingrix Considering tetanus shot in pharmacy  Mammogram ordered  Dexa utd  Out aged colon cancer screening  Has routine dermatology appt next week       Elevated TSH   B12 deficiency    Lab Results  Component Value Date   VITAMINB12 732 01/12/2023   Continues oral supplementation

## 2023-01-19 NOTE — Assessment & Plan Note (Signed)
bp in fair control at this time  BP Readings from Last 1 Encounters:  01/19/23 128/60  This is usually lower at home and also in cardiology office  Most recent labs reviewed as well as cardiology note Disc lifstyle change with low sodium diet and exercise  Plans to continue Amlodipine 2.5 mg daily  imdur 60 mg daily  Prn propranolol

## 2023-01-19 NOTE — Assessment & Plan Note (Addendum)
Reviewed health habits including diet and exercise and skin cancer prevention Reviewed appropriate screening tests for age  Also reviewed health mt list, fam hx and immunization status , as well as social and family history   See HPI Labs reviewed and ordered Declines shingrix Considering tetanus shot in pharmacy  Mammogram ordered  Dexa utd  Out aged colon cancer screening  Has routine dermatology appt next week  PHQ of 0

## 2023-01-19 NOTE — Assessment & Plan Note (Signed)
Lab Results  Component Value Date   TSH 5.02 01/12/2023   Normal  Also normal FT4

## 2023-01-19 NOTE — Assessment & Plan Note (Signed)
Lab Results  Component Value Date   HGBA1C 6.6 (H) 01/12/2023   Continues metformin xr 500 mg daily  Open to trying farxiga 5 mg daily (hold glipizide) for cardiac health  Given handout  Will call if side effects like uti or vaginal symptoms  Follow up 3 mo  Microalb utd On a statin  Eye exam utd

## 2023-01-19 NOTE — Assessment & Plan Note (Signed)
Disc goals for lipids and reasons to control them Rev last labs with pt Rev low sat fat diet in detail Improved with LDL of 87- increased  Tolerating crestor 5 mg three days weekly now

## 2023-01-19 NOTE — Patient Instructions (Addendum)
You are due for a tetanus shot - this is cheaper in the pharmacy   Take vitamin D for bone health  2000 international units of vitamin D3 daily   Take care of yourself the best you can    For diabetes Stop glipizide/ hold it  Try the farxiga 5 mg daily (if any side effects including uti/ vaginal symptoms or if you feel dehydrated let us know)  This medicine helps diabetes and heart function    You can call to schedule your mammogram   []   2D Mammogram  [x]   3D Mammogram  []   Bone Density     Please call for appointment:   []   [facility]  [address] [place] [address]  [phone]  []   [facility])   [address]  [place], [place] [address]  [phone]  [x]   [facility]      [address] [place], [place]        [phone]         []   [facility]  [address] [place], [place]  [phone]  []  [facility]   [address]   [place], [place] [address]  [phone]  []  [facility]  [address] [place], [place] [address] [phone]    Make sure to wear two piece clothing  No lotions powders or deodorants the day of the appointment Make sure to bring picture ID and insurance card.  Bring list of medications you are currently taking including any supplements.   Schedule your screening mammogram through MyChart!   Select [facility] imaging sites can now be scheduled through MyChart.  Log into your MyChart account.  Go to 'Visit' (or 'Appointments' if  on mobile App) --> Schedule an  Appointment  Under 'Select a Reason for Visit' choose the Mammogram  Screening option.  Complete the pre-visit questions  and select the time and place that  best fits your schedule

## 2023-01-19 NOTE — Assessment & Plan Note (Signed)
Continues cardiology care with eliquis and propranolol

## 2023-01-19 NOTE — Assessment & Plan Note (Signed)
Lab Results  Component Value Date   VITAMINB12 732 01/12/2023   Continues oral supplementation

## 2023-01-19 NOTE — Assessment & Plan Note (Signed)
Ongoing cardiology care  Open to trying farxiga for DM and cardiac health

## 2023-02-04 DIAGNOSIS — D0461 Carcinoma in situ of skin of right upper limb, including shoulder: Secondary | ICD-10-CM | POA: Diagnosis not present

## 2023-02-04 DIAGNOSIS — D0472 Carcinoma in situ of skin of left lower limb, including hip: Secondary | ICD-10-CM | POA: Diagnosis not present

## 2023-02-04 DIAGNOSIS — D485 Neoplasm of uncertain behavior of skin: Secondary | ICD-10-CM | POA: Diagnosis not present

## 2023-02-06 ENCOUNTER — Other Ambulatory Visit: Payer: Self-pay | Admitting: Family Medicine

## 2023-02-13 ENCOUNTER — Encounter: Payer: Self-pay | Admitting: Family Medicine

## 2023-02-13 ENCOUNTER — Ambulatory Visit: Payer: PPO | Admitting: Family Medicine

## 2023-02-13 VITALS — BP 126/64 | HR 58 | Temp 97.9°F | Ht 67.25 in | Wt 186.4 lb

## 2023-02-13 DIAGNOSIS — I480 Paroxysmal atrial fibrillation: Secondary | ICD-10-CM | POA: Diagnosis not present

## 2023-02-13 DIAGNOSIS — T887XXA Unspecified adverse effect of drug or medicament, initial encounter: Secondary | ICD-10-CM | POA: Diagnosis not present

## 2023-02-13 DIAGNOSIS — I10 Essential (primary) hypertension: Secondary | ICD-10-CM | POA: Diagnosis not present

## 2023-02-13 DIAGNOSIS — Z7984 Long term (current) use of oral hypoglycemic drugs: Secondary | ICD-10-CM

## 2023-02-13 DIAGNOSIS — E1169 Type 2 diabetes mellitus with other specified complication: Secondary | ICD-10-CM

## 2023-02-13 DIAGNOSIS — E785 Hyperlipidemia, unspecified: Secondary | ICD-10-CM

## 2023-02-13 DIAGNOSIS — I2510 Atherosclerotic heart disease of native coronary artery without angina pectoris: Secondary | ICD-10-CM | POA: Diagnosis not present

## 2023-02-13 DIAGNOSIS — E1151 Type 2 diabetes mellitus with diabetic peripheral angiopathy without gangrene: Secondary | ICD-10-CM | POA: Diagnosis not present

## 2023-02-13 MED ORDER — GLIPIZIDE ER 10 MG PO TB24: 10.0000 mg | ORAL_TABLET | Freq: Every day | ORAL | 1 refills | Status: DC

## 2023-02-13 MED ORDER — NITROGLYCERIN 0.4 MG SL SUBL: 0.4000 mg | SUBLINGUAL_TABLET | SUBLINGUAL | 1 refills | Status: DC | PRN

## 2023-02-13 MED ORDER — TERCONAZOLE 0.8 % VA CREA: TOPICAL_CREAM | VAGINAL | 0 refills | Status: AC

## 2023-02-13 NOTE — Assessment & Plan Note (Signed)
bp in fair control at this time  BP Readings from Last 1 Encounters:  02/13/23 126/64  This is usually lower at home and also in cardiology office  Most recent labs reviewed as well as cardiology note Disc lifstyle change with low sodium diet and exercise  Plans to continue Amlodipine 2.5 mg daily  imdur 60 mg daily  Prn propranolol

## 2023-02-13 NOTE — Progress Notes (Signed)
Subjective:    Patient ID: Margaret Vazquez, female    DOB: 10-15-1935, 87 y.o.   MRN: [id number]  HPI  Wt Readings from Last 3 Encounters:  02/13/23 186 lb 6 oz (84.5 kg)  01/19/23 189 lb 2 oz (85.8 kg)  01/15/23 191 lb (86.6 kg)   28.97 kg/m  Vitals:   02/13/23 1003  BP: 126/64  Pulse: (!) 58  Temp: 97.9 F (36.6 C)  SpO2: 96%    Pt presents to discuss medicine side effects -farxiga    Needs refill of nitroglycerin  Last visit -pt was open to trying farxiga 5 mg (instead of glipizide) for cardiac health  We discussed possible side effects  Of note did not tolerate jardiance  (also SGLT2)  Previously took glipizide xl 10 mg daily  Still takes metformin xr 500 mg daily    Now feels terrible with farxiga  Her blood sugar is much higher now  200s (268) in am fasting  Used to be in 100s   Is eating right also   Then felt pains in her big toes/ fleeting /sharp Also some increased pain in fingers as well (left 4th finger- joint got swollen   Also exhausted  More shortness of breath   Some itching of vulva - itchy and irritated  No discharge - just irritated / occational burning feeling   More urinary incontinence   Lab Results  Component Value Date   HGBA1C 6.6 (H) 01/12/2023   Sees cardiology for CAD and PAF (eliquis and propranolol)   HTN bp is stable today  No cp or palpitations or headaches or edema  No side effects to medicines  BP Readings from Last 3 Encounters:  02/13/23 126/64  01/19/23 128/60  01/15/23 120/60    Amlodipine 2.5 mg daily  imdur 60 mg daily  Prn propranolol  Pulse Readings from Last 3 Encounters:  02/13/23 (!) 58  01/19/23 60  01/15/23 (!) 58   Cardiology went up on crestor to 5 mg three times per week More muscle pain now Needs to go back to twice weekly     Lab Results  Component Value Date   NA 142 01/12/2023   K 4.4 01/12/2023   CO2 30 01/12/2023   GLUCOSE 78 01/12/2023   BUN 13 01/12/2023   CREATININE  0.92 01/12/2023   CALCIUM 9.3 01/12/2023   GFR 56.87 (L) 01/12/2023   GFRNONAA >60 12/07/2022     Patient Active Problem List   Diagnosis Date Noted   Side effect of medication 02/13/2023   Estrogen deficiency 11/07/2021   Grief reaction 05/08/2021   Elevated TSH 11/05/2020   History of loop recorder 09/06/2019   History of seizure 03/23/2019   Left-sided epistaxis 11/17/2018   Medicare annual wellness visit, subsequent 09/13/2018   Screening mammogram, encounter for 09/13/2018   Coronary artery disease 12/24/2017   Routine general medical examination at a health care facility 08/24/2016   PAF (paroxysmal atrial fibrillation) (HCC) 04/07/2016   Overweight (BMI 25.0-29.9) 08/23/2014   Type 2 diabetes mellitus with diabetic peripheral angiopathy without gangrene, without long-term current use of insulin (HCC) 01/12/2014   History of CVA (cerebrovascular accident) 01/11/2014   LOOP Recorder LINQ 06/07/2013   Sinusitis, chronic 05/16/2013   H/O: CVA (cerebrovascular accident) 02/15/2013   Encounter for Medicare annual wellness exam 10/27/2012   Encopresis(307.7) 12/15/2011   Post-menopausal 10/28/2011   PERSONAL HX COLONIC POLYPS 09/04/2009   B12 deficiency 03/06/2009   ALLERGIC RHINITIS 02/14/2008   BACK  PAIN, LUMBAR 11/03/2007   Hyperlipidemia associated with type 2 diabetes mellitus (HCC) 11/24/2006   Essential hypertension 11/24/2006   GERD 10/09/2006   OVERACTIVE BLADDER 10/09/2006   INCONTINENCE, URGE 10/09/2006   SKIN CANCER, HX OF 10/09/2006   Past Medical History:  Diagnosis Date   Allergy history, drug    Aspirin   Basal cell carcinoma    "back"   CAD (coronary artery disease)    a. Previously nonobstructive then progressive angina with abnl CT -> cardiac cath 12/24/17 showed 30% mid RCA and 80% prox-mid Cx. She received DES to mid AV groove Cx. EF 55-65%.    Cervical stenosis of spine    With neck pain   CKD (chronic kidney disease), stage II    Colon polyps  2009   Complication of anesthesia 1980s   slow to wake after anesthesia "when I had breast biopsy"   GERD (gastroesophageal reflux disease)    Hyperlipidemia    myalgias with Lipitor and Zetia   Hypertension    Migraine    "stopped at age 87" (12/24/2017)   Squamous carcinoma    "iced off and cut off; mostly arms" (12/24/2017)   Stroke [facility])    "told me I'd had 2 strokes in 02/2013"; denies residual on 12/24/2017   TIA (transient ischemic attack)    Type II diabetes mellitus (HCC)    Past Surgical History:  Procedure Laterality Date   ABD [place]  07/2003   Negative   APPENDECTOMY     BASAL CELL CARCINOMA EXCISION     "back"   BREAST BIOPSY Left 1992   benign   CARDIAC CATHETERIZATION  04/07/2011   non obst CAD (Dr Excell Seltzer)   CATARACT EXTRACTION W/PHACO Left 04/01/2017   Procedure: CATARACT EXTRACTION PHACO AND INTRAOCULAR LENS PLACEMENT (IOC) LEFT DIABETIC;  Surgeon: Lockie Mola, MD;  Location: [facility];  Service: Ophthalmology;  Laterality: Left;  Diabetic - oral meds   CATARACT EXTRACTION W/PHACO Right 04/29/2017   Procedure: CATARACT EXTRACTION PHACO AND INTRAOCULAR LENS PLACEMENT (IOC) RIGHT DIABETIC;  Surgeon: Lockie Mola, MD;  Location: [facility];  Service: Ophthalmology;  Laterality: Right;  Diabetic - oral meds   COLONOSCOPY  12/2007   Adenomatous colon polyps   CORONARY ANGIOPLASTY WITH STENT PLACEMENT  12/24/2017   CORONARY STENT INTERVENTION N/A 12/24/2017   Procedure: CORONARY STENT INTERVENTION;  Surgeon: Runell Gess, MD;  Location: [facility];  Service: Cardiovascular;  Laterality: N/A;   CYSTOSCOPY W/ DECANNULATION  03/2000   Normal   LEFT HEART CATH AND CORONARY ANGIOGRAPHY N/A 12/24/2017   Procedure: LEFT HEART CATH AND CORONARY ANGIOGRAPHY;  Surgeon: Runell Gess, MD;  Location: [facility];  Service: Cardiovascular;  Laterality: N/A;   LOOP RECORDER IMPLANT N/A 02/16/2013   Procedure: LOOP RECORDER IMPLANT;   Surgeon: Duke Salvia, MD;  Location: [facility];  Service: Cardiovascular;  Laterality: N/A;   MOHS SURGERY     right hand    NASAL SINUS SURGERY  01/2005   SQUAMOUS CELL CARCINOMA EXCISION     "mostly arms;" (12/24/2017)   STRESS CARDIOLITE  11/1999   Normal/ negative   TEAR DUCT PROBING  2005   "? side"   TEE WITHOUT CARDIOVERSION N/A 02/16/2013   Procedure: TRANSESOPHAGEAL ECHOCARDIOGRAM (TEE);  Surgeon: Wendall Stade, MD;  Location: [facility];  Service: Cardiovascular;  Laterality: N/A;   TUBAL LIGATION     BTL   Social History   Tobacco Use  Smoking status: Never   Smokeless tobacco: Never  Vaping Use   Vaping status: Never Used  Substance Use Topics   Alcohol use: Never    Alcohol/week: 0.0 standard drinks of alcohol   Drug use: Never   Family History  Problem Relation Age of Onset   Lung cancer Brother    Diabetes Brother    Pancreatic cancer Brother    Heart disease Mother    Heart disease Father    Brain cancer Other    Skin cancer Daughter    Diabetes Sister    Breast cancer Sister    Colon cancer Neg Hx    Allergies  Allergen Reactions   Bee Venom Hives, Shortness Of Breath and Swelling   Nabumetone Anaphylaxis   Amoxicillin-Pot Clavulanate Hives and Swelling    To lips.   Aspirin Hives   Atorvastatin Swelling     joint pain/swelling, inc liver tests   Clopidogrel Bisulfate Hives   Codeine Nausea And Vomiting   Ezetimibe Other (See Comments)     fatigue   Metformin And Related Other (See Comments)    Diarrhea    Valsartan Other (See Comments)     fatigue   Farxiga [Dapagliflozin]     Perineal discomfort  Increased urinary incontinence Fatigue  Joint pain    Jardiance [Empagliflozin]     Did not tolerate   Other Hives    **Red Meat**  SOB   Current Outpatient Medications on File Prior to Visit  Medication Sig Dispense Refill   amLODipine (NORVASC) 2.5 MG tablet Take 1 tablet (2.5 mg) by mouth once daily 90 tablet 3   apixaban  (ELIQUIS) 5 MG TABS tablet TAKE 1 TABLET(5 MG) BY MOUTH TWICE DAILY 60 tablet 5   blood glucose meter kit and supplies Dispense based on patient and insurance preference. Use up to four times daily as directed. (FOR ICD-10 E10.9, E11.9). 1 each 0   Blood Glucose Monitoring Suppl (ONE TOUCH ULTRA 2) w/Device KIT Use to check blood sugar once daily 1 each 0   Cyanocobalamin (VITAMIN B-12 PO) Take 1 tablet by mouth daily.     diphenhydrAMINE (BENADRYL) 25 MG tablet Take 50 mg by mouth every 8 (eight) hours as needed for allergies.      EPINEPHrine (EPI-PEN) 0.3 mg/0.3 mL DEVI Inject 0.3 mg into the muscle daily as needed (allergic reaction).     fluticasone (FLONASE) 50 MCG/ACT nasal spray Place 2 sprays into both nostrils 2 (two) times daily as needed. Reported on 09/10/2015     furosemide (LASIX) 20 MG tablet Take 1 tablet (20 mg total) by mouth every other day. 90 tablet 3   glucose blood (ONETOUCH ULTRA) test strip USE TO CHECK BLOOD SUGAR ONCE DAILY AS DIRECTED 100 strip 1   isosorbide mononitrate (IMDUR) 60 MG 24 hr tablet TAKE 1 TABLET(60 MG) BY MOUTH DAILY 90 tablet 3   metFORMIN (GLUCOPHAGE-XR) 500 MG 24 hr tablet TAKE 1 TABLET(500 MG) BY MOUTH DAILY WITH BREAKFAST 90 tablet 0   propranolol (INDERAL) 10 MG tablet Take 1 tablet (10 mg) by mouth every 1 hour x 3 doses as needed for chest pain/ fast heart rates 30 tablet 1   rosuvastatin (CRESTOR) 5 MG tablet Take 1 tablet (5 mg total) by mouth every Monday, Wednesday, and Friday. (Patient taking differently: Take 5 mg by mouth 2 (two) times a week.) 36 tablet 3   Current Facility-Administered Medications on File Prior to Visit  Medication Dose Route Frequency Provider Last  Rate Last Admin   Study - ORION 4 - inclisiran 300 mg/1.65mL or placebo SQ injection (PI-Stuckey)  300 mg Subcutaneous Q6 months Herby Abraham, MD        Review of Systems  Constitutional:  Positive for fatigue. Negative for activity change, appetite change, fever and  unexpected weight change.  HENT:  Negative for congestion, ear pain, rhinorrhea, sinus pressure and sore throat.   Eyes:  Negative for pain, redness and visual disturbance.  Respiratory:  Positive for shortness of breath. Negative for cough, wheezing and stridor.   Cardiovascular:  Negative for chest pain and palpitations.  Gastrointestinal:  Negative for abdominal pain, blood in stool, constipation and diarrhea.  Endocrine: Negative for polydipsia and polyuria.  Genitourinary:  Negative for dysuria, frequency, pelvic pain and urgency.       Vulvar itch and burn - no discharge  Only external   Urinary incontinence  Musculoskeletal:  Positive for arthralgias and myalgias. Negative for back pain.  Skin:  Negative for pallor and rash.  Allergic/Immunologic: Negative for environmental allergies.  Neurological:  Negative for dizziness, syncope and headaches.  Hematological:  Negative for adenopathy. Bruises/bleeds easily.  Psychiatric/Behavioral:  Negative for decreased concentration and dysphoric mood. The patient is not nervous/anxious.        Objective:   Physical Exam Constitutional:      General: She is not in acute distress.    Appearance: Normal appearance. She is well-developed. She is not ill-appearing or diaphoretic.     Comments: Overweight   HENT:     Head: Normocephalic and atraumatic.  Eyes:     General: No scleral icterus.       Right eye: No discharge.        Left eye: No discharge.     Conjunctiva/sclera: Conjunctivae normal.     Pupils: Pupils are equal, round, and reactive to light.  Neck:     Thyroid: No thyromegaly.     Vascular: No carotid bruit or JVD.  Cardiovascular:     Rate and Rhythm: Normal rate and regular rhythm.     Heart sounds: Normal heart sounds.     No gallop.  Pulmonary:     Effort: Pulmonary effort is normal. No respiratory distress.     Breath sounds: Normal breath sounds. No stridor. No wheezing, rhonchi or rales.  Abdominal:      General: There is no distension or abdominal bruit.     Palpations: Abdomen is soft.  Musculoskeletal:     Cervical back: Normal range of motion and neck supple.     Right lower leg: No edema.     Left lower leg: No edema.  Lymphadenopathy:     Cervical: No cervical adenopathy.  Skin:    General: Skin is warm and dry.     Coloration: Skin is not pale.     Findings: Bruising present. No rash.     Comments: Bruising/ senile purpura on arms   No skin tears   Recently treated skin cancer on left lower leg-oval and healing  Neurological:     Mental Status: She is alert.     Cranial Nerves: No cranial nerve deficit.     Coordination: Coordination normal.     Deep Tendon Reflexes: Reflexes are normal and symmetric. Reflexes normal.  Psychiatric:        Mood and Affect: Mood normal.           Assessment & Plan:   Problem List Items Addressed This Visit  Cardiovascular and Mediastinum   Type 2 diabetes mellitus with diabetic peripheral angiopathy without gangrene, without long-term current use of insulin (HCC)    We tried farxiga for DM and heart health  Intolerant of it - aches/pains/ vulvar itching and burning/ worse urinary incontinence and exercise intolerance/ fatigue In addition to that-much higher glucose levels Of note also did not tolerate jardiance  She will let cardiology know   Lab Results  Component Value Date   HGBA1C 6.6 (H) 01/12/2023   Wll continue metfomrin xr 500 mg daily and return to glipizide xl 10mg  daily watching closely for hypoglycemia  Continues low glycemic diet  Instructed to update if further problems       Relevant Medications   glipiZIDE (GLUCOTROL XL) 10 MG 24 hr tablet   nitroGLYCERIN (NITROSTAT) 0.4 MG SL tablet   PAF (paroxysmal atrial fibrillation) (HCC)    Continues eliquis  Notes more bruising but tolerable  Prn propranolol  Pulse is 58 today      Relevant Medications   nitroGLYCERIN (NITROSTAT) 0.4 MG SL tablet    Essential hypertension    bp in fair control at this time  BP Readings from Last 1 Encounters:  02/13/23 126/64  This is usually lower at home and also in cardiology office  Most recent labs reviewed as well as cardiology note Disc lifstyle change with low sodium diet and exercise  Plans to continue Amlodipine 2.5 mg daily  imdur 60 mg daily  Prn propranolol        Relevant Medications   nitroGLYCERIN (NITROSTAT) 0.4 MG SL tablet   Coronary artery disease    Did not tolerate jardiance or farxiga unfortunately   Refilled nitroglycerin today  Will follow up with cardiology as planned       Relevant Medications   nitroGLYCERIN (NITROSTAT) 0.4 MG SL tablet     Endocrine   Hyperlipidemia associated with type 2 diabetes mellitus (HCC)    Pt did not tolerate crestor 3 d per week  Plans to go back to 2 days   Causing muscle and joint pain       Relevant Medications   glipiZIDE (GLUCOTROL XL) 10 MG 24 hr tablet   nitroGLYCERIN (NITROSTAT) 0.4 MG SL tablet     Other   Side effect of medication - Primary    From farxiga - urinary and vulvar symptoms (will treatment with terazol cream for yeast), fatigue and exercise intol (also does not work well for blood sugar)  From crestor-muscle and joint pain- does not tolerate 3 d per week so will go back to 2)

## 2023-02-13 NOTE — Assessment & Plan Note (Signed)
Continues eliquis  Notes more bruising but tolerable  Prn propranolol  Pulse is 58 today

## 2023-02-13 NOTE — Assessment & Plan Note (Signed)
From farxiga - urinary and vulvar symptoms (will treatment with terazol cream for yeast), fatigue and exercise intol (also does not work well for blood sugar)  From crestor-muscle and joint pain- does not tolerate 3 d per week so will go back to 2)

## 2023-02-13 NOTE — Assessment & Plan Note (Signed)
Did not tolerate jardiance or farxiga unfortunately   Refilled nitroglycerin today  Will follow up with cardiology as planned

## 2023-02-13 NOTE — Assessment & Plan Note (Signed)
Pt did not tolerate crestor 3 d per week  Plans to go back to 2 days   Causing muscle and joint pain

## 2023-02-13 NOTE — Patient Instructions (Addendum)
Stop the generic farxiga   Start back on glipizide xl 10 mg daily  If blood sugars do not improve in the next 2 weeks let us know  Keep watching your diet   If low readings - 70s or below we will need to cut glipizide dose so let us know   Take care of yourself   Use the terazole

## 2023-02-13 NOTE — Assessment & Plan Note (Signed)
We tried farxiga for DM and heart health  Intolerant of it - aches/pains/ vulvar itching and burning/ worse urinary incontinence and exercise intolerance/ fatigue In addition to that-much higher glucose levels Of note also did not tolerate jardiance  She will let cardiology know   Lab Results  Component Value Date   HGBA1C 6.6 (H) 01/12/2023   Wll continue metfomrin xr 500 mg daily and return to glipizide xl 10mg  daily watching closely for hypoglycemia  Continues low glycemic diet  Instructed to update if further problems

## 2023-03-11 ENCOUNTER — Other Ambulatory Visit: Payer: Self-pay | Admitting: Internal Medicine

## 2023-03-11 DIAGNOSIS — I480 Paroxysmal atrial fibrillation: Secondary | ICD-10-CM

## 2023-03-11 NOTE — Telephone Encounter (Signed)
Prescription refill request for Eliquis received. Indication: PAF Last office visit: 01/15/23  Odessa Fleming MD Scr: 0.92 on 01/12/23  Epic Age: 87 Weight: 86.6kg  Based on above findings Eliquis 5mg  twice daily is the appropriate dose.  Refill approved.

## 2023-03-13 ENCOUNTER — Telehealth: Payer: Self-pay | Admitting: Family Medicine

## 2023-03-13 ENCOUNTER — Other Ambulatory Visit: Payer: Self-pay | Admitting: Internal Medicine

## 2023-03-13 NOTE — Telephone Encounter (Signed)
Patient called in to get her awv phone call scheduled. She already had her awv for this year with pcp,so I wasn't sure if she still needed to be scheduled for this year or next year.She would like a return call.

## 2023-03-30 ENCOUNTER — Ambulatory Visit (INDEPENDENT_AMBULATORY_CARE_PROVIDER_SITE_OTHER): Payer: PPO | Admitting: *Deleted

## 2023-03-30 VITALS — Ht 67.25 in | Wt 189.0 lb

## 2023-03-30 DIAGNOSIS — Z0000 Encounter for general adult medical examination without abnormal findings: Secondary | ICD-10-CM | POA: Diagnosis not present

## 2023-03-30 NOTE — Patient Instructions (Signed)
Margaret Vazquez , Thank you for taking time to come for your Medicare Wellness Visit. I appreciate your ongoing commitment to your health goals. Please review the following plan we discussed and let me know if I can assist you in the future.   Referrals/Orders/Follow-Ups/Clinician Recommendations: None  This is a list of the screening recommended for you and due dates:  Health Maintenance  Topic Date Due   DTaP/Tdap/Td vaccine (3 - Tdap) 10/27/2021   COVID-19 Vaccine (4 - 2023-24 season) 03/08/2023   Flu Shot  10/05/2023*   Mammogram  01/19/2024*   Zoster (Shingles) Vaccine (1 of 2) 04/20/2028*   Eye exam for diabetics  06/17/2023   Hemoglobin A1C  07/15/2023   Complete foot exam   10/13/2023   Yearly kidney function blood test for diabetes  01/12/2024   Yearly kidney health urinalysis for diabetes  01/12/2024   Medicare Annual Wellness Visit  03/29/2024   Pneumonia Vaccine  Completed   DEXA scan (bone density measurement)  Completed   HPV Vaccine  Aged Out  *Topic was postponed. The date shown is not the original due date.    Advanced directives: (In Chart) A copy of your advanced directives are scanned into your chart should your provider ever need it.  Next Medicare Annual Wellness Visit scheduled for next year: Yes 04/04/24 @ 1:00

## 2023-03-30 NOTE — Progress Notes (Signed)
Subjective:   Margaret Vazquez is a 87 y.o. female who presents for Medicare Annual (Subsequent) preventive examination.  Visit Complete: Virtual  I connected with  Margaret Vazquez on 03/30/23 by a audio enabled telemedicine application and verified that I am speaking with the correct person using two identifiers.  Patient Location: Home  Provider Location: Office/Clinic  I discussed the limitations of evaluation and management by telemedicine. The patient expressed understanding and agreed to proceed.  Vital Signs: Unable to obtain new vitals due to this being a telehealth visit.  Cardiac Risk Factors include: advanced age (>88men, >78 women);sedentary lifestyle;dyslipidemia;hypertension;diabetes mellitus;Other (see comment), Risk factor comments: CAD     Objective:    Today's Vitals   03/30/23 1039  Weight: 189 lb (85.7 kg)  Height: 5' 7.25" (1.708 m)   Body mass index is 29.38 kg/m.     03/30/2023   10:57 AM 12/07/2022   11:31 AM 04/20/2022    1:42 AM 04/01/2021    2:07 PM 09/27/2019    4:53 PM 01/18/2019   10:38 AM 09/17/2018    9:37 AM  Advanced Directives  Does Patient Have a Medical Advance Directive? Yes No No No No No No  Type of Estate agent of [place];Living will        Does patient want to make changes to medical advance directive? No - Patient declined        Copy of Healthcare Power of Attorney in Chart? Yes - validated most recent copy scanned in chart (See row information)        Would patient like information on creating a medical advance directive?    No - Patient declined No - Patient declined Yes (MAU/Ambulatory/Procedural Areas - Information given) No - Patient declined    Current Medications (verified) Outpatient Encounter Medications as of 03/30/2023  Medication Sig   amLODipine (NORVASC) 2.5 MG tablet TAKE 1 TABLET(2.5 MG) BY MOUTH EVERY DAY   blood glucose meter kit and supplies Dispense based on patient and insurance preference.  Use up to four times daily as directed. (FOR ICD-10 E10.9, E11.9).   Blood Glucose Monitoring Suppl (ONE TOUCH ULTRA 2) w/Device KIT Use to check blood sugar once daily   Cyanocobalamin (VITAMIN B-12 PO) Take 1 tablet by mouth daily.   diphenhydrAMINE (BENADRYL) 25 MG tablet Take 50 mg by mouth every 8 (eight) hours as needed for allergies.    ELIQUIS 5 MG TABS tablet TAKE 1 TABLET(5 MG) BY MOUTH TWICE DAILY   EPINEPHrine (EPI-PEN) 0.3 mg/0.3 mL DEVI Inject 0.3 mg into the muscle daily as needed (allergic reaction).   fluticasone (FLONASE) 50 MCG/ACT nasal spray Place 2 sprays into both nostrils 2 (two) times daily as needed. Reported on 09/10/2015   furosemide (LASIX) 20 MG tablet Take 1 tablet (20 mg total) by mouth every other day.   glipiZIDE (GLUCOTROL XL) 10 MG 24 hr tablet Take 1 tablet (10 mg total) by mouth daily with breakfast.   glucose blood (ONETOUCH ULTRA) test strip USE TO CHECK BLOOD SUGAR ONCE DAILY AS DIRECTED   isosorbide mononitrate (IMDUR) 60 MG 24 hr tablet TAKE 1 TABLET(60 MG) BY MOUTH DAILY   metFORMIN (GLUCOPHAGE-XR) 500 MG 24 hr tablet TAKE 1 TABLET(500 MG) BY MOUTH DAILY WITH BREAKFAST   nitroGLYCERIN (NITROSTAT) 0.4 MG SL tablet Place 1 tablet (0.4 mg total) under the tongue every 5 (five) minutes as needed for chest pain (up to 3 doses. If taking 3rd dose call 911).  propranolol (INDERAL) 10 MG tablet Take 1 tablet (10 mg) by mouth every 1 hour x 3 doses as needed for chest pain/ fast heart rates   rosuvastatin (CRESTOR) 5 MG tablet Take 1 tablet (5 mg total) by mouth every Monday, Wednesday, and Friday. (Patient taking differently: Take 5 mg by mouth. 3 times weekly)   terconazole (TERAZOL 3) 0.8 % vaginal cream Use externally daily as needed for yeast infection symptoms   Facility-Administered Encounter Medications as of 03/30/2023  Medication   Study - ORION 4 - inclisiran 300 mg/1.22mL or placebo SQ injection (PI-Stuckey)    Allergies (verified) Bee venom,  Nabumetone, Amoxicillin-pot clavulanate, Aspirin, Atorvastatin, Clopidogrel bisulfate, Codeine, Ezetimibe, Metformin and related, Valsartan, Farxiga [dapagliflozin], Jardiance [empagliflozin], and Other   History: Past Medical History:  Diagnosis Date   Allergy history, drug    Aspirin   Basal cell carcinoma    "back"   CAD (coronary artery disease)    a. Previously nonobstructive then progressive angina with abnl CT -> cardiac cath 12/24/17 showed 30% mid RCA and 80% prox-mid Cx. She received DES to mid AV groove Cx. EF 55-65%.    Cervical stenosis of spine    With neck pain   CKD (chronic kidney disease), stage II    Colon polyps 2009   Complication of anesthesia 1980s   slow to wake after anesthesia "when I had breast biopsy"   GERD (gastroesophageal reflux disease)    Hyperlipidemia    myalgias with Lipitor and Zetia   Hypertension    Migraine    "stopped at age 87" (12/24/2017)   Squamous carcinoma    "iced off and cut off; mostly arms" (12/24/2017)   Stroke [facility])    "told me I'd had 2 strokes in 02/2013"; denies residual on 12/24/2017   TIA (transient ischemic attack)    Type II diabetes mellitus (HCC)    Past Surgical History:  Procedure Laterality Date   ABD [place]  07/2003   Negative   APPENDECTOMY     BASAL CELL CARCINOMA EXCISION     "back"   BREAST BIOPSY Left 1992   benign   CARDIAC CATHETERIZATION  04/07/2011   non obst CAD (Dr Excell Seltzer)   CATARACT EXTRACTION W/PHACO Left 04/01/2017   Procedure: CATARACT EXTRACTION PHACO AND INTRAOCULAR LENS PLACEMENT (IOC) LEFT DIABETIC;  Surgeon: Lockie Mola, MD;  Location: [facility];  Service: Ophthalmology;  Laterality: Left;  Diabetic - oral meds   CATARACT EXTRACTION W/PHACO Right 04/29/2017   Procedure: CATARACT EXTRACTION PHACO AND INTRAOCULAR LENS PLACEMENT (IOC) RIGHT DIABETIC;  Surgeon: Lockie Mola, MD;  Location: [facility];  Service: Ophthalmology;  Laterality: Right;  Diabetic - oral  meds   COLONOSCOPY  12/2007   Adenomatous colon polyps   CORONARY ANGIOPLASTY WITH STENT PLACEMENT  12/24/2017   CORONARY STENT INTERVENTION N/A 12/24/2017   Procedure: CORONARY STENT INTERVENTION;  Surgeon: Runell Gess, MD;  Location: [facility] INVAS[facility] CV [facility];  Service: Cardiovascular;  Laterality: N/A;   CYSTOSCOPY W/ DECANNULATION  03/2000   Normal   LEFT HEART CATH AND CORONARY ANGIOGRAPHY N/A 12/24/2017   Procedure: LEFT HEART CATH AND CORONARY ANGIOGRAPHY;  Surgeon: Runell Gess, MD;  Location: [facility]VAS[facility] CV [facility];  Service: Cardiovascular;  Laterality: N/A;   LOOP RECORDER IMPLANT N/A 02/16/2013   Procedure: LOOP RECORDER IMPLANT;  Surgeon: Duke Salvia, MD;  Location: [facility] CATH [facility];  Service: Cardiovascular;  Laterality: N/A;   MOHS SURGERY     right hand  NASAL SINUS SURGERY  01/2005   skin cancer removed  2024   arm and leg   SQUAMOUS CELL CARCINOMA EXCISION     "mostly arms;" (12/24/2017)   STRESS CARDIOLITE  11/1999   Normal/ negative   TEAR DUCT PROBING  2005   "? side"   TEE WITHOUT CARDIOVERSION N/A 02/16/2013   Procedure: TRANSESOPHAGEAL ECHOCARDIOGRAM (TEE);  Surgeon: Wendall Stade, MD;  Location: [facility] ENDOSCOPY;  Service: Cardiovascular;  Laterality: N/A;   TUBAL LIGATION     BTL   Family History  Problem Relation Age of Onset   Lung cancer Brother    Diabetes Brother    Pancreatic cancer Brother    Heart disease Mother    Heart disease Father    Brain cancer Other    Skin cancer Daughter    Diabetes Sister    Breast cancer Sister    Colon cancer Neg Hx    Social History   Socioeconomic History   Marital status: Married    Spouse name: Renae Fickle   Number of children: 3   Years of education: 12   Highest education level: Not on file  Occupational History   Occupation: Retired    Associate Professor: RETIRED  Tobacco Use   Smoking status: Never   Smokeless tobacco: Never  Vaping Use   Vaping status: Never Used  Substance and Sexual Activity   Alcohol use:  Never    Alcohol/week: 0.0 standard drinks of alcohol   Drug use: Never   Sexual activity: Not Currently  Other Topics Concern   Not on file  Social History Narrative   3 children   Does not drink caffeinated beverages   Cares for SIL with dementia   Social Determinants of Health   Financial Resource Strain: Low Risk  (03/30/2023)   Overall Financial Resource Strain (CARDIA)    Difficulty of Paying Living Expenses: Not hard at all  Food Insecurity: No Food Insecurity (03/30/2023)   Hunger Vital Sign    Worried About Running Out of Food in the Last Year: Never true    Ran Out of Food in the Last Year: Never true  Transportation Needs: No Transportation Needs (03/30/2023)   PRAPARE - Administrator, Civil Service (Medical): No    Lack of Transportation (Non-Medical): No  Physical Activity: Inactive (03/30/2023)   Exercise Vital Sign    Days of Exercise per Week: 0 days    Minutes of Exercise per Session: 0 min  Stress: No Stress Concern Present (03/30/2023)   [employer] of Occupational Health - Occupational Stress Questionnaire    Feeling of Stress : Not at all  Social Connections: Moderately Integrated (03/30/2023)   Social Connection and Isolation Panel [NHANES]    Frequency of Communication with Friends and Family: More than three times a week    Frequency of Social Gatherings with Friends and Family: Twice a week    Attends Religious Services: More than 4 times per year    Active Member of [employer] or Organizations: No    Attends Engineer, structural: Never    Marital Status: Married    Tobacco Counseling Counseling given: Not Answered   Clinical Intake:  Pre-visit preparation completed: Yes  Pain : No/denies pain     BMI - recorded: 29.38 Nutritional Status: BMI 25 -29 Overweight Nutritional Risks: None Diabetes: Yes CBG done?: No Did pt. bring in CBG monitor from home?: No  How often do you need to have someone help you when  you read  instructions, pamphlets, or other written materials from your doctor or pharmacy?: 1 - Never  Interpreter Needed?: No  Information entered by :: R. Koni Kannan LPN   Activities of Daily Living    03/30/2023   10:41 AM  In your present state of health, do you have any difficulty performing the following activities:  Hearing? 0  Comment a little difficulty  Vision? 0  Comment readers  Difficulty concentrating or making decisions? 0  Walking or climbing stairs? 0  Dressing or bathing? 0  Doing errands, shopping? 0  Preparing Food and eating ? N  Using the Toilet? N  In the past six months, have you accidently leaked urine? Y  Comment pads occassionally  Do you have problems with loss of bowel control? Y  Comment occassionally  Managing your Medications? N  Managing your Finances? N  Housekeeping or managing your Housekeeping? N    Patient Care Team: Tower, Audrie Gallus, MD as PCP - General (Family Medicine) Duke Salvia, MD as PCP - Cardiology (Cardiology) Duke Salvia, MD as PCP - Electrophysiology (Cardiology) Dasher, Cliffton Asters, MD as Consulting Physician (Dermatology) Myrene Galas., MD as Referring Physician (Dentistry) Lockie Mola, MD as Referring Physician (Ophthalmology)  Indicate any recent Medical Services you may have received from other than [facility] providers in the past year (date may be approximate).     Assessment:   This is a routine wellness examination for Margaret Vazquez.  Hearing/Vision screen Hearing Screening - Comments:: Little difficulty Vision Screening - Comments:: readers   Goals Addressed             This Visit's Progress    Patient Stated       Wants to  lose some weight       Depression Screen    03/30/2023   10:51 AM 11/07/2021    9:06 AM 04/01/2021    2:15 PM 11/05/2020    2:28 PM 10/20/2019   10:11 AM 09/21/2019   10:13 AM 05/18/2019   11:42 AM  PHQ 2/9 Scores  PHQ - 2 Score 0 0 1 0 0 0 0  PHQ- 9 Score 3          Fall Risk     03/30/2023   10:44 AM 11/07/2021    9:06 AM 08/27/2021   10:50 AM 04/01/2021    2:14 PM 11/05/2020    2:28 PM  Fall Risk   Falls in the past year? 0 0 0 1 0  Number falls in past yr: 1  0 1   Injury with Fall? 0  0 0   Risk for fall due to : No Fall Risks   History of fall(s)   Follow up Falls prevention discussed;Falls evaluation completed Falls evaluation completed  Falls prevention discussed;Education provided Falls evaluation completed    MEDICARE RISK AT HOME: Medicare Risk at Home Any stairs in or around the home?: Yes If so, are there any without handrails?: No Home free of loose throw rugs in walkways, pet beds, electrical cords, etc?: Yes Adequate lighting in your home to reduce risk of falls?: Yes Life alert?: Yes Use of a cane, walker or w/c?: No Grab bars in the bathroom?: Yes Shower chair or bench in shower?: No Elevated toilet seat or a handicapped toilet?: No   Cognitive Function:    09/01/2017    9:20 AM 08/27/2016    9:23 AM  MMSE - Mini Mental State Exam  Orientation to time 5 5  Orientation to Place 5 5  Registration 3 3  Attention/ Calculation 0 0  Recall 3 3  Language- name 2 objects 0 0  Language- repeat 1 1  Language- follow 3 step command 3 3  Language- read & follow direction 0 0  Write a sentence 0 0  Copy design 0 0  Total score 20 20        03/30/2023   10:57 AM  6CIT Screen  What Year? 0 points  What month? 0 points  What time? 0 points  Count back from 20 2 points  Months in reverse 2 points  Repeat phrase 2 points  Total Score 6 points    Immunizations Immunization History  Administered Date(s) Administered   Fluad Quad(high Dose 65+) 03/23/2019, 03/26/2020, 05/08/2021   Influenza Split 05/05/2011, 04/28/2012   Influenza Whole 04/24/2005, 06/03/2010   Influenza,inj,Quad PF,6+ Mos 05/16/2013, 09/13/2018   Influenza-Unspecified 04/01/2014   PFIZER(Purple Top)SARS-COV-2 Vaccination 09/19/2019, 10/10/2019, 06/08/2020   Pneumococcal  Conjugate-13 08/22/2015   Pneumococcal Polysaccharide-23 12/03/2010   Td 11/27/1997, 10/28/2011    TDAP status: Due, Education has been provided regarding the importance of this vaccine. Advised may receive this vaccine at local pharmacy or Health Dept. Aware to provide a copy of the vaccination record if obtained from local pharmacy or Health Dept. Verbalized acceptance and understanding.  Flu Vaccine status: Due, Education has been provided regarding the importance of this vaccine. Advised may receive this vaccine at local pharmacy or Health Dept. Aware to provide a copy of the vaccination record if obtained from local pharmacy or Health Dept. Verbalized acceptance and understanding.  Pneumococcal vaccine status: Up to date  Covid-19 vaccine status: Information provided on how to obtain vaccines.   Qualifies for Shingles Vaccine? Yes   Zostavax completed No   Shingrix Completed?: No.    Education has been provided regarding the importance of this vaccine. Patient has been advised to call insurance company to determine out of pocket expense if they have not yet received this vaccine. Advised may also receive vaccine at local pharmacy or Health Dept. Verbalized acceptance and understanding.  Screening Tests Health Maintenance  Topic Date Due   DTaP/Tdap/Td (3 - Tdap) 10/27/2021   Medicare Annual Wellness (AWV)  11/08/2022   COVID-19 Vaccine (4 - 2023-24 season) 03/08/2023   INFLUENZA VACCINE  10/05/2023 (Originally 02/05/2023)   MAMMOGRAM  01/19/2024 (Originally 06/18/2022)   Zoster Vaccines- Shingrix (1 of 2) 04/20/2028 (Originally 10/01/1954)   OPHTHALMOLOGY EXAM  06/17/2023   HEMOGLOBIN A1C  07/15/2023   FOOT EXAM  10/13/2023   Diabetic kidney evaluation - eGFR measurement  01/12/2024   Diabetic kidney evaluation - Urine ACR  01/12/2024   Pneumonia Vaccine 65+ Years old  Completed   DEXA SCAN  Completed   HPV VACCINES  Aged Out    Health Maintenance  Health Maintenance Due   Topic Date Due   DTaP/Tdap/Td (3 - Tdap) 10/27/2021   Medicare Annual Wellness (AWV)  11/08/2022   COVID-19 Vaccine (4 - 2023-24 season) 03/08/2023    Colorectal cancer screening: No longer required.   Mammogram status: No longer required due to age Want to discuss with PCP at next visit.  Bone Density status: Completed 04/2022. Results reflect: Bone density results: NORMAL. Repeat every 2 years.  Lung Cancer Screening: (Low Dose CT Chest recommended if Age 44-80 years, 20 pack-year currently smoking OR have quit w/in 15years.) does not qualify.     Additional Screening:  Hepatitis C Screening: does not qualify; Completed  NA age  Vision Screening: Recommended annual ophthalmology exams for early detection of glaucoma and other disorders of the eye. Is the patient up to date with their annual eye exam?  Yes  Who is the provider or what is the name of the office in which the patient attends annual eye exams? [facility] If pt is not established with a provider, would they like to be referred to a provider to establish care? No .   Dental Screening: Recommended annual dental exams for proper oral hygiene  Diabetic Foot Exam:  10/2022  Community Resource Referral / Chronic Care Management: CRR required this visit?  No   CCM required this visit?  No     Plan:     I have personally reviewed and noted the following in the patient's chart:   Medical and social history Use of alcohol, tobacco or illicit drugs  Current medications and supplements including opioid prescriptions. Patient is not currently taking opioid prescriptions. Functional ability and status Nutritional status Physical activity Advanced directives List of other physicians Hospitalizations, surgeries, and ER visits in previous 12 months Vitals Screenings to include cognitive, depression, and falls Referrals and appointments  In addition, I have reviewed and discussed with patient certain preventive  protocols, quality metrics, and best practice recommendations. A written personalized care plan for preventive services as well as general preventive health recommendations were provided to patient.     Sydell Axon, LPN   10/13/8117   After Visit Summary: (MyChart) Due to this being a telephonic visit, the after visit summary with patients personalized plan was offered to patient via MyChart   Nurse Notes: None

## 2023-04-01 DIAGNOSIS — D0461 Carcinoma in situ of skin of right upper limb, including shoulder: Secondary | ICD-10-CM | POA: Diagnosis not present

## 2023-04-21 ENCOUNTER — Ambulatory Visit: Payer: PPO | Admitting: Family Medicine

## 2023-04-27 ENCOUNTER — Encounter: Payer: Self-pay | Admitting: Family Medicine

## 2023-04-27 ENCOUNTER — Ambulatory Visit (INDEPENDENT_AMBULATORY_CARE_PROVIDER_SITE_OTHER): Payer: PPO | Admitting: Family Medicine

## 2023-04-27 VITALS — BP 110/62 | HR 63 | Temp 97.9°F | Ht 67.25 in | Wt 188.1 lb

## 2023-04-27 DIAGNOSIS — E1169 Type 2 diabetes mellitus with other specified complication: Secondary | ICD-10-CM

## 2023-04-27 DIAGNOSIS — M25562 Pain in left knee: Secondary | ICD-10-CM

## 2023-04-27 DIAGNOSIS — H538 Other visual disturbances: Secondary | ICD-10-CM | POA: Diagnosis not present

## 2023-04-27 DIAGNOSIS — I2510 Atherosclerotic heart disease of native coronary artery without angina pectoris: Secondary | ICD-10-CM | POA: Diagnosis not present

## 2023-04-27 DIAGNOSIS — I10 Essential (primary) hypertension: Secondary | ICD-10-CM | POA: Diagnosis not present

## 2023-04-27 DIAGNOSIS — Z23 Encounter for immunization: Secondary | ICD-10-CM

## 2023-04-27 DIAGNOSIS — E1151 Type 2 diabetes mellitus with diabetic peripheral angiopathy without gangrene: Secondary | ICD-10-CM

## 2023-04-27 DIAGNOSIS — I480 Paroxysmal atrial fibrillation: Secondary | ICD-10-CM | POA: Diagnosis not present

## 2023-04-27 DIAGNOSIS — G8929 Other chronic pain: Secondary | ICD-10-CM

## 2023-04-27 DIAGNOSIS — E785 Hyperlipidemia, unspecified: Secondary | ICD-10-CM

## 2023-04-27 DIAGNOSIS — Z7984 Long term (current) use of oral hypoglycemic drugs: Secondary | ICD-10-CM

## 2023-04-27 DIAGNOSIS — E119 Type 2 diabetes mellitus without complications: Secondary | ICD-10-CM | POA: Insufficient documentation

## 2023-04-27 LAB — POCT GLYCOSYLATED HEMOGLOBIN (HGB A1C): Hemoglobin A1C: 5.9 % — AB (ref 4.0–5.6)

## 2023-04-27 MED ORDER — ROSUVASTATIN CALCIUM 5 MG PO TABS: ORAL_TABLET | ORAL | 3 refills | Status: DC

## 2023-04-27 NOTE — Assessment & Plan Note (Addendum)
Osteoarthritis and meniscal tear Sees Dr Yisroel Ramming in past  Has had inj and PT   More painful now Declines cane Wants to avoid surgery   She plans to follow up with ortho Reviewed fall prevention

## 2023-04-27 NOTE — Assessment & Plan Note (Addendum)
Lab Results  Component Value Date   HGBA1C 5.9 (A) 04/27/2023   See a/p for DM with angiopathy  On glipizide and metformin  No hypoglycemia On statin/can only tolerate 2 d per week

## 2023-04-27 NOTE — Assessment & Plan Note (Signed)
Continues eliquis Prn propranolol

## 2023-04-27 NOTE — Assessment & Plan Note (Signed)
Episodic Pt thinks this is migraine aura w/o headache Pupils are too constricted for good funduscopic exam today  Ref to oph No neuro def on exam

## 2023-04-27 NOTE — Assessment & Plan Note (Signed)
bp in fair control at this time  BP Readings from Last 1 Encounters:  04/27/23 110/62  This is usually lower at home and also in cardiology office  Most recent labs reviewed as well as cardiology note Disc lifstyle change with low sodium diet and exercise  Plans to continue Amlodipine 2.5 mg daily  imdur 60 mg daily  Prn propranolol

## 2023-04-27 NOTE — Progress Notes (Signed)
Subjective:    Patient ID: Margaret Vazquez, female    DOB: 01-11-1936, 87 y.o.   MRN: [id number]  HPI  Wt Readings from Last 3 Encounters:  04/27/23 188 lb 2 oz (85.3 kg)  03/30/23 189 lb (85.7 kg)  02/13/23 186 lb 6 oz (84.5 kg)   29.25 kg/m  Vitals:   04/27/23 1022  BP: 110/62  Pulse: 63  Temp: 97.9 F (36.6 C)  SpO2: 95%    Pt presents for follow up of DM2 and chronic medical problems Blurred vision  Left knee pain   Had several episode of vision change  Like aura for migraine - feels like she is going to get a headache  Not flashing light - just blurred vision  Lasts few hours - has to sit down and close eyes  Takes a tylenol - then does not get the headache   No stroke symptoms at all   HTN bp is stable today  No cp or palpitations or headaches or edema  No side effects to medicines  BP Readings from Last 3 Encounters:  04/27/23 110/62  02/13/23 126/64  01/19/23 128/60    Amlodipine 2.5 mg daily  Imdur 60 mg daily  Propranolol prn   DM2 Lab Results  Component Value Date   HGBA1C 5.9 (A) 04/27/2023   Lab Results  Component Value Date   HGBA1C 5.9 (A) 04/27/2023    Metformin xr 500 mg daily  Glipizide xl 10 mg daily   Did not tolerate farxiga   Eye exam 06/2022  Eating healthy  Not eating as much   Glucose is 80-100 in am  No lows   No exercise due to knee pain  Torn meniscus  Also arthritis MRI 2021 -reviewed today     Hyperlipidemia Lab Results  Component Value Date   CHOL 155 01/12/2023   HDL 44.80 01/12/2023   LDLCALC 87 01/12/2023   LDLDIRECT 119.0 05/01/2021   TRIG 117.0 01/12/2023   CHOLHDL 3 01/12/2023   Crestor 5 mg three days weekly - quit taking her crestor  Off of it for a month  Tolerates 2 a week   Did not tolerate zetia   Was in study for inclisiran - no longer on this but still gets checked Thinks she had side effects      Patient Active Problem List   Diagnosis Date Noted   Blurred vision  04/27/2023   Diabetes mellitus treated with oral medication (HCC) 04/27/2023   Side effect of medication 02/13/2023   Estrogen deficiency 11/07/2021   Grief reaction 05/08/2021   Elevated TSH 11/05/2020   History of loop recorder 09/06/2019   History of seizure 03/23/2019   Left-sided epistaxis 11/17/2018   Medicare annual wellness visit, subsequent 09/13/2018   Screening mammogram, encounter for 09/13/2018   Coronary artery disease 12/24/2017   Routine general medical examination at a health care facility 08/24/2016   PAF (paroxysmal atrial fibrillation) (HCC) 04/07/2016   Left knee pain 06/14/2015   Overweight (BMI 25.0-29.9) 08/23/2014   Type 2 diabetes mellitus with diabetic peripheral angiopathy without gangrene, without long-term current use of insulin (HCC) 01/12/2014   History of CVA (cerebrovascular accident) 01/11/2014   Cardiac device in situ 06/07/2013   Sinusitis, chronic 05/16/2013   H/O: CVA (cerebrovascular accident) 02/15/2013   Encounter for Medicare annual wellness exam 10/27/2012   Encopresis 12/15/2011   Post-menopausal 10/28/2011   History of colonic polyps 09/04/2009   B12 deficiency 03/06/2009   ALLERGIC RHINITIS 02/14/2008  BACK PAIN, LUMBAR 11/03/2007   Hyperlipidemia associated with type 2 diabetes mellitus (HCC) 11/24/2006   Essential hypertension 11/24/2006   GERD 10/09/2006   OVERACTIVE BLADDER 10/09/2006   INCONTINENCE, URGE 10/09/2006   SKIN CANCER, HX OF 10/09/2006   Past Medical History:  Diagnosis Date   Allergy history, drug    Aspirin   Basal cell carcinoma    "back"   CAD (coronary artery disease)    a. Previously nonobstructive then progressive angina with abnl CT -> cardiac cath 12/24/17 showed 30% mid RCA and 80% prox-mid Cx. She received DES to mid AV groove Cx. EF 55-65%.    Cervical stenosis of spine    With neck pain   CKD (chronic kidney disease), stage II    Colon polyps 2009   Complication of anesthesia 1980s   slow to  wake after anesthesia "when I had breast biopsy"   GERD (gastroesophageal reflux disease)    Hyperlipidemia    myalgias with Lipitor and Zetia   Hypertension    Migraine    "stopped at age 68" (12/24/2017)   Squamous carcinoma    "iced off and cut off; mostly arms" (12/24/2017)   Stroke [facility])    "told me I'd had 2 strokes in 02/2013"; denies residual on 12/24/2017   TIA (transient ischemic attack)    Type II diabetes mellitus (HCC)    Past Surgical History:  Procedure Laterality Date   ABD [place]  07/2003   Negative   APPENDECTOMY     BASAL CELL CARCINOMA EXCISION     "back"   BREAST BIOPSY Left 1992   benign   CARDIAC CATHETERIZATION  04/07/2011   non obst CAD (Dr Excell Seltzer)   CATARACT EXTRACTION W/PHACO Left 04/01/2017   Procedure: CATARACT EXTRACTION PHACO AND INTRAOCULAR LENS PLACEMENT (IOC) LEFT DIABETIC;  Surgeon: Lockie Mola, MD;  Location: [facility];  Service: Ophthalmology;  Laterality: Left;  Diabetic - oral meds   CATARACT EXTRACTION W/PHACO Right 04/29/2017   Procedure: CATARACT EXTRACTION PHACO AND INTRAOCULAR LENS PLACEMENT (IOC) RIGHT DIABETIC;  Surgeon: Lockie Mola, MD;  Location: [facility];  Service: Ophthalmology;  Laterality: Right;  Diabetic - oral meds   COLONOSCOPY  12/2007   Adenomatous colon polyps   CORONARY ANGIOPLASTY WITH STENT PLACEMENT  12/24/2017   CORONARY STENT INTERVENTION N/A 12/24/2017   Procedure: CORONARY STENT INTERVENTION;  Surgeon: Runell Gess, MD;  Location: [facility];  Service: Cardiovascular;  Laterality: N/A;   CYSTOSCOPY W/ DECANNULATION  03/2000   Normal   LEFT HEART CATH AND CORONARY ANGIOGRAPHY N/A 12/24/2017   Procedure: LEFT HEART CATH AND CORONARY ANGIOGRAPHY;  Surgeon: Runell Gess, MD;  Location: [facility];  Service: Cardiovascular;  Laterality: N/A;   LOOP RECORDER IMPLANT N/A 02/16/2013   Procedure: LOOP RECORDER IMPLANT;  Surgeon: Duke Salvia, MD;  Location: [facility]  CATH LAB;  Service: Cardiovascular;  Laterality: N/A;   MOHS SURGERY     right hand    NASAL SINUS SURGERY  01/2005   skin cancer removed  2024   arm and leg   SQUAMOUS CELL CARCINOMA EXCISION     "mostly arms;" (12/24/2017)   STRESS CARDIOLITE  11/1999   Normal/ negative   TEAR DUCT PROBING  2005   "? side"   TEE WITHOUT CARDIOVERSION N/A 02/16/2013   Procedure: TRANSESOPHAGEAL ECHOCARDIOGRAM (TEE);  Surgeon: Wendall Stade, MD;  Location: [facility] ENDOSCOPY;  Service: Cardiovascular;  Laterality: N/A;   TUBAL LIGATION  BTL   Social History   Tobacco Use   Smoking status: Never   Smokeless tobacco: Never  Vaping Use   Vaping status: Never Used  Substance Use Topics   Alcohol use: Never    Alcohol/week: 0.0 standard drinks of alcohol   Drug use: Never   Family History  Problem Relation Age of Onset   Lung cancer Brother    Diabetes Brother    Pancreatic cancer Brother    Heart disease Mother    Heart disease Father    Brain cancer Other    Skin cancer Daughter    Diabetes Sister    Breast cancer Sister    Colon cancer Neg Hx    Allergies  Allergen Reactions   Bee Venom Hives, Shortness Of Breath and Swelling   Nabumetone Anaphylaxis   Amoxicillin-Pot Clavulanate Hives and Swelling    To lips.   Aspirin Hives   Atorvastatin Swelling     joint pain/swelling, inc liver tests   Clopidogrel Bisulfate Hives   Codeine Nausea And Vomiting   Ezetimibe Other (See Comments)     fatigue   Metformin And Related Other (See Comments)    Diarrhea    Valsartan Other (See Comments)     fatigue   Farxiga [Dapagliflozin]     Perineal discomfort  Increased urinary incontinence Fatigue  Joint pain    Jardiance [Empagliflozin]     Did not tolerate   Other Hives    **Red Meat**  SOB   Current Outpatient Medications on File Prior to Visit  Medication Sig Dispense Refill   amLODipine (NORVASC) 2.5 MG tablet TAKE 1 TABLET(2.5 MG) BY MOUTH EVERY DAY 90 tablet 3   blood glucose  meter kit and supplies Dispense based on patient and insurance preference. Use up to four times daily as directed. (FOR ICD-10 E10.9, E11.9). 1 each 0   Blood Glucose Monitoring Suppl (ONE TOUCH ULTRA 2) w/Device KIT Use to check blood sugar once daily 1 each 0   Cyanocobalamin (VITAMIN B-12 PO) Take 1 tablet by mouth daily.     diphenhydrAMINE (BENADRYL) 25 MG tablet Take 50 mg by mouth every 8 (eight) hours as needed for allergies.      ELIQUIS 5 MG TABS tablet TAKE 1 TABLET(5 MG) BY MOUTH TWICE DAILY 60 tablet 5   EPINEPHrine (EPI-PEN) 0.3 mg/0.3 mL DEVI Inject 0.3 mg into the muscle daily as needed (allergic reaction).     fluticasone (FLONASE) 50 MCG/ACT nasal spray Place 2 sprays into both nostrils 2 (two) times daily as needed. Reported on 09/10/2015     glipiZIDE (GLUCOTROL XL) 10 MG 24 hr tablet Take 1 tablet (10 mg total) by mouth daily with breakfast. 90 tablet 1   glucose blood (ONETOUCH ULTRA) test strip USE TO CHECK BLOOD SUGAR ONCE DAILY AS DIRECTED 100 strip 1   isosorbide mononitrate (IMDUR) 60 MG 24 hr tablet TAKE 1 TABLET(60 MG) BY MOUTH DAILY 90 tablet 3   metFORMIN (GLUCOPHAGE-XR) 500 MG 24 hr tablet TAKE 1 TABLET(500 MG) BY MOUTH DAILY WITH BREAKFAST 90 tablet 0   nitroGLYCERIN (NITROSTAT) 0.4 MG SL tablet Place 1 tablet (0.4 mg total) under the tongue every 5 (five) minutes as needed for chest pain (up to 3 doses. If taking 3rd dose call 911). 15 tablet 1   propranolol (INDERAL) 10 MG tablet Take 1 tablet (10 mg) by mouth every 1 hour x 3 doses as needed for chest pain/ fast heart rates 30 tablet 1  terconazole (TERAZOL 3) 0.8 % vaginal cream Use externally daily as needed for yeast infection symptoms 20 g 0   furosemide (LASIX) 20 MG tablet Take 1 tablet (20 mg total) by mouth every other day. 90 tablet 3   Current Facility-Administered Medications on File Prior to Visit  Medication Dose Route Frequency Provider Last Rate Last Admin   Study - ORION 4 - inclisiran 300 mg/1.18mL  or placebo SQ injection (PI-Stuckey)  300 mg Subcutaneous Q6 months Herby Abraham, MD        Review of Systems  Constitutional:  Negative for activity change, appetite change, fatigue, fever and unexpected weight change.  HENT:  Negative for congestion, ear pain, rhinorrhea, sinus pressure and sore throat.   Eyes:  Positive for visual disturbance. Negative for photophobia, pain, discharge, redness and itching.  Respiratory:  Negative for cough, shortness of breath and wheezing.   Cardiovascular:  Negative for chest pain and palpitations.  Gastrointestinal:  Negative for abdominal pain, blood in stool, constipation and diarrhea.  Endocrine: Negative for polydipsia and polyuria.  Genitourinary:  Negative for dysuria, frequency and urgency.  Musculoskeletal:  Positive for arthralgias. Negative for back pain and myalgias.  Skin:  Negative for pallor and rash.  Allergic/Immunologic: Negative for environmental allergies.  Neurological:  Negative for dizziness, syncope and headaches.  Hematological:  Negative for adenopathy. Does not bruise/bleed easily.  Psychiatric/Behavioral:  Negative for decreased concentration and dysphoric mood. The patient is not nervous/anxious.        Objective:   Physical Exam Constitutional:      General: She is not in acute distress.    Appearance: Normal appearance. She is well-developed. She is not ill-appearing or diaphoretic.     Comments: Overweight   HENT:     Head: Normocephalic and atraumatic.     Mouth/Throat:     Mouth: Mucous membranes are moist.  Eyes:     Conjunctiva/sclera: Conjunctivae normal.     Pupils: Pupils are equal, round, and reactive to light.  Neck:     Thyroid: No thyromegaly.     Vascular: No carotid bruit or JVD.  Cardiovascular:     Rate and Rhythm: Normal rate and regular rhythm.     Heart sounds: Normal heart sounds.     No gallop.  Pulmonary:     Effort: Pulmonary effort is normal. No respiratory distress.      Breath sounds: Normal breath sounds. No wheezing or rales.  Abdominal:     General: There is no distension or abdominal bruit.     Palpations: Abdomen is soft.  Musculoskeletal:     Cervical back: Normal range of motion and neck supple.     Right lower leg: No edema.     Left lower leg: No edema.     Comments: Limited rom left knee This affects gait   Lymphadenopathy:     Cervical: No cervical adenopathy.  Skin:    General: Skin is warm and dry.     Coloration: Skin is not pale.     Findings: No rash.  Neurological:     Mental Status: She is alert.     Coordination: Coordination normal.     Deep Tendon Reflexes: Reflexes are normal and symmetric. Reflexes normal.  Psychiatric:        Mood and Affect: Mood normal.           Assessment & Plan:   Problem List Items Addressed This Visit       Cardiovascular and  Mediastinum   Coronary artery disease    Encouraged pt to take as much statin as she can tolerate  Reviewed last cardiology note       Relevant Medications   rosuvastatin (CRESTOR) 5 MG tablet   Essential hypertension    bp in fair control at this time  BP Readings from Last 1 Encounters:  04/27/23 110/62  This is usually lower at home and also in cardiology office  Most recent labs reviewed as well as cardiology note Disc lifstyle change with low sodium diet and exercise  Plans to continue Amlodipine 2.5 mg daily  imdur 60 mg daily  Prn propranolol        Relevant Medications   rosuvastatin (CRESTOR) 5 MG tablet   PAF (paroxysmal atrial fibrillation) (HCC)    Continues eliquis Prn propranolol       Relevant Medications   rosuvastatin (CRESTOR) 5 MG tablet   Type 2 diabetes mellitus with diabetic peripheral angiopathy without gangrene, without long-term current use of insulin (HCC) - Primary    Lab Results  Component Value Date   HGBA1C 5.9 (A) 04/27/2023   Continues metformin xr 500 mg daily  Glipizide xl 10 mg daily  Did not tolerate  farxiga  Can only tolerate statin twice weekly   Good control  Reviewed diet  No hypoglycemia  Limited exercise due to knee pain Flu shot today  Good foot care        Relevant Medications   rosuvastatin (CRESTOR) 5 MG tablet   Other Relevant Orders   POCT HgB A1C (Completed)     Endocrine   Diabetes mellitus treated with oral medication (HCC)    Lab Results  Component Value Date   HGBA1C 5.9 (A) 04/27/2023   See a/p for DM with angiopathy  On glipizide and metformin  No hypoglycemia On statin/can only tolerate 2 d per week      Relevant Medications   rosuvastatin (CRESTOR) 5 MG tablet   Hyperlipidemia associated with type 2 diabetes mellitus (HCC)    Disc goals for lipids and reasons to control them Rev last labs with pt Rev low sat fat diet in detail Last LDL 87 Can only tolerate crestor twice weekly- sent that in  Intol to zetia Was in study for inj drug-did not tolerate  Encouraged fu with cardiology      Relevant Medications   rosuvastatin (CRESTOR) 5 MG tablet     Other   Blurred vision    Episodic Pt thinks this is migraine aura w/o headache Pupils are too constricted for good funduscopic exam today  Ref to oph No neuro def on exam      Relevant Orders   Ambulatory referral to Ophthalmology   Left knee pain    Osteoarthritis and meniscal tear Sees Dr Yisroel Ramming in past  Has had inj and PT   More painful now Declines cane Wants to avoid surgery   She plans to follow up with ortho Reviewed fall prevention

## 2023-04-27 NOTE — Assessment & Plan Note (Signed)
Disc goals for lipids and reasons to control them Rev last labs with pt Rev low sat fat diet in detail Last LDL 87 Can only tolerate crestor twice weekly- sent that in  Intol to zetia Was in study for inj drug-did not tolerate  Encouraged fu with cardiology

## 2023-04-27 NOTE — Assessment & Plan Note (Signed)
Encouraged pt to take as much statin as she can tolerate  Reviewed last cardiology note

## 2023-04-27 NOTE — Patient Instructions (Addendum)
Call your orthopedic to get an appointment for knee -Dr Yisroel Ramming Use a cane if you needed    Call Dr Skip Estimable office to schedule a visit for blurred vision  Your diabetic eye exam is due in December  Take care of yourself   Try to get most of your carbohydrates from produce (with the exception of white potatoes) and whole grains Eat less bread/pasta/rice/snack foods/cereals/sweets and other items from the middle of the grocery store (processed carbs)  Follow up in 6 months with labs prior    Let us know if you get low blood sugars

## 2023-04-27 NOTE — Assessment & Plan Note (Signed)
Lab Results  Component Value Date   HGBA1C 5.9 (A) 04/27/2023   Continues metformin xr 500 mg daily  Glipizide xl 10 mg daily  Did not tolerate farxiga  Can only tolerate statin twice weekly   Good control  Reviewed diet  No hypoglycemia  Limited exercise due to knee pain Flu shot today  Good foot care

## 2023-04-29 ENCOUNTER — Encounter: Payer: Self-pay | Admitting: *Deleted

## 2023-04-29 DIAGNOSIS — D485 Neoplasm of uncertain behavior of skin: Secondary | ICD-10-CM | POA: Diagnosis not present

## 2023-04-29 DIAGNOSIS — L281 Prurigo nodularis: Secondary | ICD-10-CM | POA: Diagnosis not present

## 2023-04-29 DIAGNOSIS — C44622 Squamous cell carcinoma of skin of right upper limb, including shoulder: Secondary | ICD-10-CM | POA: Diagnosis not present

## 2023-04-29 DIAGNOSIS — D2272 Melanocytic nevi of left lower limb, including hip: Secondary | ICD-10-CM | POA: Diagnosis not present

## 2023-04-29 DIAGNOSIS — L821 Other seborrheic keratosis: Secondary | ICD-10-CM | POA: Diagnosis not present

## 2023-04-29 DIAGNOSIS — D2271 Melanocytic nevi of right lower limb, including hip: Secondary | ICD-10-CM | POA: Diagnosis not present

## 2023-04-29 DIAGNOSIS — Z85828 Personal history of other malignant neoplasm of skin: Secondary | ICD-10-CM | POA: Diagnosis not present

## 2023-04-29 DIAGNOSIS — D2261 Melanocytic nevi of right upper limb, including shoulder: Secondary | ICD-10-CM | POA: Diagnosis not present

## 2023-04-29 DIAGNOSIS — D225 Melanocytic nevi of trunk: Secondary | ICD-10-CM | POA: Diagnosis not present

## 2023-04-29 DIAGNOSIS — D2262 Melanocytic nevi of left upper limb, including shoulder: Secondary | ICD-10-CM | POA: Diagnosis not present

## 2023-04-29 DIAGNOSIS — L570 Actinic keratosis: Secondary | ICD-10-CM | POA: Diagnosis not present

## 2023-05-05 ENCOUNTER — Other Ambulatory Visit: Payer: Self-pay | Admitting: Family Medicine

## 2023-06-11 DIAGNOSIS — C44622 Squamous cell carcinoma of skin of right upper limb, including shoulder: Secondary | ICD-10-CM | POA: Diagnosis not present

## 2023-06-11 DIAGNOSIS — L905 Scar conditions and fibrosis of skin: Secondary | ICD-10-CM | POA: Diagnosis not present

## 2023-07-11 ENCOUNTER — Other Ambulatory Visit: Payer: Self-pay | Admitting: Internal Medicine

## 2023-07-14 ENCOUNTER — Encounter: Payer: Self-pay | Admitting: *Deleted

## 2023-07-14 DIAGNOSIS — Z006 Encounter for examination for normal comparison and control in clinical research program: Secondary | ICD-10-CM

## 2023-07-14 NOTE — Research (Signed)
 Spoke with patient  She lost her husband back on Jun 23 2023 No med changes   Will follow up in July for her next appt

## 2023-08-05 NOTE — Progress Notes (Unsigned)
Patient Care Team: Tower, Audrie Gallus, MD as PCP - General (Family Medicine) Duke Salvia, MD as PCP - Cardiology (Cardiology) Duke Salvia, MD as PCP - Electrophysiology (Cardiology) Dasher, Cliffton Asters, MD as Consulting Physician (Dermatology) Myrene Galas., MD as Referring Physician (Dentistry) Lockie Mola, MD as Referring Physician (Ophthalmology)   HPI  Margaret Vazquez is a 88 y.o. female seen in follow-up for chronotropic incompetence and exercise intolerance.  Peak heart rate in the office was 73.  Undertook modified treadmill 8/22 heart rate 64--104  8/14 loop recorder implanted for cryptogenic stroke.  Atrial fibrillation identified now on anticoagulation with apixaban.  No significant  bleeding device abandoned   2019 coronary artery disease-CT>> abnormal FFR; circumflex was stented     The patient denies chest pain, shortness of breath, nocturnal dyspnea, orthopnea or peripheral edema.  There have been no palpitations, lightheadedness or syncope.   Intolerant of statins taking it just once a week  Husband died just a few weeks ago, rather unexpectedly -- struggling  90 yrs     DATE TEST EF   6/19 LHC 55-65% CXp/m-80>>stent RCAm 30%  10/21 Echo   65-70 %   2/23 Myoview hyperdynamic No ischemia  1/24 Echo  60-65%    Date Cr K TSH Hgb LDL  9/21 0.98 3.9  15.0    3/22 0.9 4.2 5.14 14.3   10/22 1.1 4.0 2.21    4/23 0.99 4.1 3.02 14.2 110  7/24 0.92 4.4 5.02 14.0    Thromboembolic risk factors ( age  -2, HTN-1, TIA/CVA-2, DM-1Vasc disease -1, Gender-1) for a CHADSVASc Score of >=8      Past Medical History:  Diagnosis Date   Allergy history, drug    Aspirin   Basal cell carcinoma    "back"   CAD (coronary artery disease)    a. Previously nonobstructive then progressive angina with abnl CT -> cardiac cath 12/24/17 showed 30% mid RCA and 80% prox-mid Cx. She received DES to mid AV groove Cx. EF 55-65%.    Cervical stenosis of spine     With neck pain   CKD (chronic kidney disease), stage II    Colon polyps 2009   Complication of anesthesia 1980s   slow to wake after anesthesia "when I had breast biopsy"   GERD (gastroesophageal reflux disease)    Hyperlipidemia    myalgias with Lipitor and Zetia   Hypertension    Migraine    "stopped at age 44" (12/24/2017)   Squamous carcinoma    "iced off and cut off; mostly arms" (12/24/2017)   Stroke [facility])    "told me I'd had 2 strokes in 02/2013"; denies residual on 12/24/2017   TIA (transient ischemic attack)    Type II diabetes mellitus (HCC)     Past Surgical History:  Procedure Laterality Date   ABD [place]  07/2003   Negative   APPENDECTOMY     BASAL CELL CARCINOMA EXCISION     "back"   BREAST BIOPSY Left 1992   benign   CARDIAC CATHETERIZATION  04/07/2011   non obst CAD (Dr Excell Seltzer)   CATARACT EXTRACTION W/PHACO Left 04/01/2017   Procedure: CATARACT EXTRACTION PHACO AND INTRAOCULAR LENS PLACEMENT (IOC) LEFT DIABETIC;  Surgeon: Lockie Mola, MD;  Location: [facility];  Service: Ophthalmology;  Laterality: Left;  Diabetic - oral meds   CATARACT EXTRACTION W/PHACO Right 04/29/2017   Procedure: CATARACT EXTRACTION PHACO AND INTRAOCULAR LENS PLACEMENT (IOC) RIGHT DIABETIC;  Surgeon: Lockie Mola, MD;  Location: [facility];  Service: Ophthalmology;  Laterality: Right;  Diabetic - oral meds   COLONOSCOPY  12/2007   Adenomatous colon polyps   CORONARY ANGIOPLASTY WITH STENT PLACEMENT  12/24/2017   CORONARY STENT INTERVENTION N/A 12/24/2017   Procedure: CORONARY STENT INTERVENTION;  Surgeon: Runell Gess, MD;  Location: [facility] INVASIVE CV LAB;  Service: Cardiovascular;  Laterality: N/A;   CYSTOSCOPY W/ DECANNULATION  03/2000   Normal   LEFT HEART CATH AND CORONARY ANGIOGRAPHY N/A 12/24/2017   Procedure: LEFT HEART CATH AND CORONARY ANGIOGRAPHY;  Surgeon: Runell Gess, MD;  Location: [facility] INVASIVE CV LAB;  Service: Cardiovascular;  Laterality:  N/A;   LOOP RECORDER IMPLANT N/A 02/16/2013   Procedure: LOOP RECORDER IMPLANT;  Surgeon: Duke Salvia, MD;  Location: [facility] CATH LAB;  Service: Cardiovascular;  Laterality: N/A;   MOHS SURGERY     right hand    NASAL SINUS SURGERY  01/2005   skin cancer removed  2024   arm and leg   SQUAMOUS CELL CARCINOMA EXCISION     "mostly arms;" (12/24/2017)   STRESS CARDIOLITE  11/1999   Normal/ negative   TEAR DUCT PROBING  2005   "? side"   TEE WITHOUT CARDIOVERSION N/A 02/16/2013   Procedure: TRANSESOPHAGEAL ECHOCARDIOGRAM (TEE);  Surgeon: Wendall Stade, MD;  Location: [facility] ENDOSCOPY;  Service: Cardiovascular;  Laterality: N/A;   TUBAL LIGATION     BTL    Current Outpatient Medications  Medication Sig Dispense Refill   amLODipine (NORVASC) 2.5 MG tablet TAKE 1 TABLET(2.5 MG) BY MOUTH EVERY DAY 90 tablet 3   blood glucose meter kit and supplies Dispense based on patient and insurance preference. Use up to four times daily as directed. (FOR ICD-10 E10.9, E11.9). 1 each 0   Blood Glucose Monitoring Suppl (ONE TOUCH ULTRA 2) w/Device KIT Use to check blood sugar once daily 1 each 0   Cyanocobalamin (VITAMIN B-12 PO) Take 1 tablet by mouth daily.     diphenhydrAMINE (BENADRYL) 25 MG tablet Take 50 mg by mouth every 8 (eight) hours as needed for allergies.      ELIQUIS 5 MG TABS tablet TAKE 1 TABLET(5 MG) BY MOUTH TWICE DAILY 60 tablet 5   EPINEPHrine (EPI-PEN) 0.3 mg/0.3 mL DEVI Inject 0.3 mg into the muscle daily as needed (allergic reaction).     fluticasone (FLONASE) 50 MCG/ACT nasal spray Place 2 sprays into both nostrils 2 (two) times daily as needed. Reported on 09/10/2015     glipiZIDE (GLUCOTROL XL) 10 MG 24 hr tablet Take 1 tablet (10 mg total) by mouth daily with breakfast. 90 tablet 1   glucose blood (ONETOUCH ULTRA) test strip USE TO CHECK BLOOD SUGAR ONCE DAILY AS DIRECTED 100 strip 1   isosorbide mononitrate (IMDUR) 60 MG 24 hr tablet TAKE 1 TABLET(60 MG) BY MOUTH DAILY 90 tablet 1    metFORMIN (GLUCOPHAGE-XR) 500 MG 24 hr tablet TAKE 1 TABLET(500 MG) BY MOUTH DAILY WITH BREAKFAST 90 tablet 1   nitroGLYCERIN (NITROSTAT) 0.4 MG SL tablet Place 1 tablet (0.4 mg total) under the tongue every 5 (five) minutes as needed for chest pain (up to 3 doses. If taking 3rd dose call 911). 15 tablet 1   propranolol (INDERAL) 10 MG tablet Take 1 tablet (10 mg) by mouth every 1 hour x 3 doses as needed for chest pain/ fast heart rates 30 tablet 1   rosuvastatin (CRESTOR) 5 MG tablet Take one pill twice weekly  24 tablet 3   terconazole (TERAZOL 3) 0.8 % vaginal cream Use externally daily as needed for yeast infection symptoms 20 g 0   furosemide (LASIX) 20 MG tablet Take 1 tablet (20 mg total) by mouth every other day. 90 tablet 3   Current Facility-Administered Medications  Medication Dose Route Frequency Provider Last Rate Last Admin   Study - ORION 4 - inclisiran 300 mg/1.52mL or placebo SQ injection (PI-Stuckey)  300 mg Subcutaneous Q6 months Herby Abraham, MD        Allergies  Allergen Reactions   Bee Venom Hives, Shortness Of Breath and Swelling   Nabumetone Anaphylaxis   Amoxicillin-Pot Clavulanate Hives and Swelling    To lips.   Aspirin Hives   Atorvastatin Swelling     joint pain/swelling, inc liver tests   Clopidogrel Bisulfate Hives   Codeine Nausea And Vomiting   Ezetimibe Other (See Comments)     fatigue   Metformin And Related Other (See Comments)    Diarrhea    Valsartan Other (See Comments)     fatigue   Farxiga [Dapagliflozin]     Perineal discomfort  Increased urinary incontinence Fatigue  Joint pain    Jardiance [Empagliflozin]     Did not tolerate   Other Hives    **Red Meat**  SOB    Review of Systems negative except from HPI and PMH  Physical Exam BP 118/78 (BP Location: Left Arm, Patient Position: Sitting, Cuff Size: Normal)   Pulse 64   Ht 5\' 8"  (1.727 m)   Wt 184 lb 6.4 oz (83.6 kg)   SpO2 96%   BMI 28.04 kg/m  Well developed and  nourished in no acute distress HENT normal Neck supple with JVP-  flat  Clear Regular rate and rhythm, no murmurs or gallops Abd-soft with active BS No Clubbing cyanosis edema Skin-warm and dry A & Oriented  Grossly normal sensory and motor function  ECG sinus @ 64 19/09/39   Assessment and  Plan  Cryptogenic stroke  Implantable loop recorder -abandoned   Elevated blood pressure  Coronary disease s/p Cx stenting  Dyspnea on exertion  Exertional chest pain consistent with angina-persistent  Atrial fibrillation paroxysmal   Cholesterol-lowering therapy intolerant (statins, ezetimibe, PCSK9)  Sinus bradycardia w chronotropic incompetence   Relatively stable--despite the loss   No bleeding.  Continue Eliquis.  Intolerance of the statins.  Taking it once a week. Will need to consider alternative therapies.  Blood pressure is well-controlled and chest pain is now eliminated since introduction of amlodipine.  A little bit of edema but not too bad.  Continue on a low-dose as needed diuretic

## 2023-08-06 ENCOUNTER — Ambulatory Visit: Payer: PPO | Admitting: Internal Medicine

## 2023-08-06 ENCOUNTER — Encounter: Payer: Self-pay | Admitting: Internal Medicine

## 2023-08-06 ENCOUNTER — Ambulatory Visit: Payer: PPO | Attending: Internal Medicine | Admitting: Internal Medicine

## 2023-08-06 VITALS — BP 118/78 | HR 64 | Ht 68.0 in | Wt 184.4 lb

## 2023-08-06 DIAGNOSIS — I480 Paroxysmal atrial fibrillation: Secondary | ICD-10-CM | POA: Diagnosis not present

## 2023-08-06 DIAGNOSIS — I639 Cerebral infarction, unspecified: Secondary | ICD-10-CM

## 2023-08-06 DIAGNOSIS — R001 Bradycardia, unspecified: Secondary | ICD-10-CM | POA: Diagnosis not present

## 2023-08-06 NOTE — Patient Instructions (Signed)

## 2023-09-15 ENCOUNTER — Telehealth: Payer: Self-pay

## 2023-09-15 NOTE — Telephone Encounter (Signed)
 Copied from CRM [id number]. Topic: Clinical - Lab/Test Results >> Sep 15, 2023  4:26 PM [name] wrote: Reason for CRM: Patient request call back regarding letter on MyChart about incorrect kidney lab function test

## 2023-09-16 NOTE — Telephone Encounter (Signed)
 Pt called back & asked if Shapale could call her back at [phone] [facility] Phone) again. She want's speak directly with a nurse. Please call & advise.

## 2023-09-16 NOTE — Telephone Encounter (Signed)
 Her microalbumin after calculation correction is still in the normal range  No action needed

## 2023-09-16 NOTE — Telephone Encounter (Signed)
 Left VM letting pt know Dr. Royden Purl comments

## 2023-09-16 NOTE — Telephone Encounter (Signed)
 Pt called back to follow-up. Please call and advise.

## 2023-09-17 NOTE — Telephone Encounter (Signed)
 Called patient reviewed in formation will call back if any questions.

## 2023-10-03 ENCOUNTER — Other Ambulatory Visit: Payer: Self-pay | Admitting: Internal Medicine

## 2023-10-03 DIAGNOSIS — I480 Paroxysmal atrial fibrillation: Secondary | ICD-10-CM

## 2023-10-05 NOTE — Telephone Encounter (Signed)
 Prescription refill request for Eliquis received. Indication:afib Last office visit:1/25 Scr:0.92  7/24 Age: 88 Weight:83.6  kg  Prescription refilled

## 2023-10-08 ENCOUNTER — Other Ambulatory Visit: Payer: Self-pay | Admitting: Internal Medicine

## 2023-10-12 DIAGNOSIS — H43813 Vitreous degeneration, bilateral: Secondary | ICD-10-CM | POA: Diagnosis not present

## 2023-10-12 DIAGNOSIS — H18513 Endothelial corneal dystrophy, bilateral: Secondary | ICD-10-CM | POA: Diagnosis not present

## 2023-10-12 DIAGNOSIS — E119 Type 2 diabetes mellitus without complications: Secondary | ICD-10-CM | POA: Diagnosis not present

## 2023-10-12 DIAGNOSIS — H353132 Nonexudative age-related macular degeneration, bilateral, intermediate dry stage: Secondary | ICD-10-CM | POA: Diagnosis not present

## 2023-10-12 LAB — HM DIABETES EYE EXAM

## 2023-10-28 DIAGNOSIS — D2262 Melanocytic nevi of left upper limb, including shoulder: Secondary | ICD-10-CM | POA: Diagnosis not present

## 2023-10-28 DIAGNOSIS — D2272 Melanocytic nevi of left lower limb, including hip: Secondary | ICD-10-CM | POA: Diagnosis not present

## 2023-10-28 DIAGNOSIS — D0461 Carcinoma in situ of skin of right upper limb, including shoulder: Secondary | ICD-10-CM | POA: Diagnosis not present

## 2023-10-28 DIAGNOSIS — L570 Actinic keratosis: Secondary | ICD-10-CM | POA: Diagnosis not present

## 2023-10-28 DIAGNOSIS — D485 Neoplasm of uncertain behavior of skin: Secondary | ICD-10-CM | POA: Diagnosis not present

## 2023-10-28 DIAGNOSIS — L820 Inflamed seborrheic keratosis: Secondary | ICD-10-CM | POA: Diagnosis not present

## 2023-10-28 DIAGNOSIS — D0462 Carcinoma in situ of skin of left upper limb, including shoulder: Secondary | ICD-10-CM | POA: Diagnosis not present

## 2023-10-28 DIAGNOSIS — Z85828 Personal history of other malignant neoplasm of skin: Secondary | ICD-10-CM | POA: Diagnosis not present

## 2023-10-28 DIAGNOSIS — D225 Melanocytic nevi of trunk: Secondary | ICD-10-CM | POA: Diagnosis not present

## 2023-10-28 DIAGNOSIS — D0439 Carcinoma in situ of skin of other parts of face: Secondary | ICD-10-CM | POA: Diagnosis not present

## 2023-10-28 DIAGNOSIS — D2261 Melanocytic nevi of right upper limb, including shoulder: Secondary | ICD-10-CM | POA: Diagnosis not present

## 2023-10-31 ENCOUNTER — Other Ambulatory Visit: Payer: Self-pay | Admitting: Family Medicine

## 2023-11-02 NOTE — Telephone Encounter (Signed)
 Refilled times one Please schedule follow up

## 2023-11-02 NOTE — Telephone Encounter (Signed)
 Last DM f/u was on 04/27/23 and no future appts., please advise

## 2023-11-03 NOTE — Telephone Encounter (Signed)
 Lvm and sent mychart to schedule follow up with Dr Malissa Se

## 2023-11-04 ENCOUNTER — Encounter: Payer: Self-pay | Admitting: Family Medicine

## 2023-11-04 ENCOUNTER — Ambulatory Visit (INDEPENDENT_AMBULATORY_CARE_PROVIDER_SITE_OTHER): Admitting: Family Medicine

## 2023-11-04 VITALS — BP 122/74 | HR 67 | Temp 97.9°F | Ht 68.0 in | Wt 184.4 lb

## 2023-11-04 DIAGNOSIS — E1151 Type 2 diabetes mellitus with diabetic peripheral angiopathy without gangrene: Secondary | ICD-10-CM

## 2023-11-04 DIAGNOSIS — Z7984 Long term (current) use of oral hypoglycemic drugs: Secondary | ICD-10-CM | POA: Diagnosis not present

## 2023-11-04 DIAGNOSIS — E119 Type 2 diabetes mellitus without complications: Secondary | ICD-10-CM

## 2023-11-04 DIAGNOSIS — E1169 Type 2 diabetes mellitus with other specified complication: Secondary | ICD-10-CM | POA: Diagnosis not present

## 2023-11-04 DIAGNOSIS — F4320 Adjustment disorder, unspecified: Secondary | ICD-10-CM | POA: Diagnosis not present

## 2023-11-04 DIAGNOSIS — E785 Hyperlipidemia, unspecified: Secondary | ICD-10-CM | POA: Diagnosis not present

## 2023-11-04 DIAGNOSIS — I10 Essential (primary) hypertension: Secondary | ICD-10-CM

## 2023-11-04 DIAGNOSIS — R42 Dizziness and giddiness: Secondary | ICD-10-CM | POA: Diagnosis not present

## 2023-11-04 NOTE — Assessment & Plan Note (Signed)
 A1c pending.

## 2023-11-04 NOTE — Assessment & Plan Note (Signed)
 bp in fair control at this time  BP Readings from Last 1 Encounters:  11/04/23 122/74  This is usually lower at home and also in cardiology office  Most recent labs reviewed as well as cardiology note Disc lifstyle change with low sodium diet and exercise  Plans to continue Amlodipine  2.5 mg daily  imdur  60 mg daily  Lasix  20 mg every other day Prn propranolol 

## 2023-11-04 NOTE — Assessment & Plan Note (Signed)
 A1c pending  Metformin  xr 500 mg daily  Glipizide  xl 10 mg daily  No hypoglycemia  Did not tolerate farxiga  from cardiology  Takes statin twice per week  Microalb utd  Good foot care

## 2023-11-04 NOTE — Assessment & Plan Note (Signed)
 Disc goals for lipids and reasons to control them Rev last labs with pt Rev low sat fat diet in detail Crestor  5 mg twice weekly (most tolerated)  In setting of cardiac dz   Lab today

## 2023-11-04 NOTE — Patient Instructions (Addendum)
 Unless you are on a fluid restriction  Try and get at least 40 oz of fluid daily -especially in warm weather   Try not to stand up quickly  Take your time      Make you are eating regularly  Make sure every meal and snack has protein   The following are examples of protein in diet  Meat  Fish  Eggs  Dairy products  Soy products  Oat milk  Almond milk Nuts and nut butters  Legumes  Dried beans      Labs today for chem labs/ glucose /thyroid  and cbc

## 2023-11-04 NOTE — Assessment & Plan Note (Signed)
 Candidly discusses symptoms and stressors   Lost husband Good support  Declines grief counseling

## 2023-11-04 NOTE — Progress Notes (Signed)
 Subjective:    Patient ID: Margaret Vazquez, female    DOB: [birth year], 88 y.o.   MRN: [id number]  HPI  Wt Readings from Last 3 Encounters:  11/04/23 184 lb 6.4 oz (83.6 kg)  08/06/23 184 lb 6.4 oz (83.6 kg)  04/27/23 188 lb 2 oz (85.3 kg)   28.04 kg/m  Vitals:   11/04/23 1425  BP: 122/74  Pulse: 67  Temp: 97.9 F (36.6 C)  SpO2: 94%    Pt presents for follow up of DM2, HTN  and chronic health problems  Also grief reaction  More tired today /off balance when getting up (not dizzy)    HTN bp is stable today but she did get dizzy when standing up quickly in waiting room  No cp or palpitations or headaches or edema  No side effects to medicines  BP Readings from Last 3 Encounters:  11/04/23 122/74  08/06/23 118/78  04/27/23 110/62    Amlodipine  2.5 mg daily  imdur  60 mg daily  Prn propranolol  Lasix  20 mg every other day    (urinated less than usual)   Pulse Readings from Last 3 Encounters:  11/04/23 67  08/06/23 64  04/27/23 63   Took medicines today   Takes eliquis  for a fib  Also CAD   Fluid intake Cup of coffee this am  Few sips of water  Did plant some zenia seeds   Lost husband this year Has been difficult      Lab Results  Component Value Date   NA 142 01/12/2023   K 4.4 01/12/2023   CO2 30 01/12/2023   GLUCOSE 78 01/12/2023   BUN 13 01/12/2023   CREATININE 0.92 01/12/2023   CALCIUM  9.3 01/12/2023   GFR 56.87 (L) 01/12/2023   GFRNONAA >60 12/07/2022   Lab Results  Component Value Date   ALT 9 01/12/2023   AST 13 01/12/2023   ALKPHOS 66 01/12/2023   BILITOT 0.5 01/12/2023     DM2 Lab Results  Component Value Date   HGBA1C 5.9 (A) 04/27/2023   HGBA1C 6.6 (H) 01/12/2023   HGBA1C 6.1 (A) 10/13/2022   Metformin  xr 500 mg daily  Glipizide  xl 10 mg daily  Did not tolerate farxiga  from cardiology   Tolerates statin twice weekly at most   Lab Results  Component Value Date   MICROALBUR <0.7 01/12/2023   MICROALBUR 0.8  10/31/2021   normal   Lab Results  Component Value Date   CHOL 155 01/12/2023   HDL 44.80 01/12/2023   LDLCALC 87 01/12/2023   LDLDIRECT 119.0 05/01/2021   TRIG 117.0 01/12/2023   CHOLHDL 3 01/12/2023   Crestor  5 mg twice weekly   Patient Active Problem List   Diagnosis Date Noted   Light headed 11/04/2023   Blurred vision 04/27/2023   Diabetes mellitus treated with oral medication (HCC) 04/27/2023   Side effect of medication 02/13/2023   Estrogen deficiency 11/07/2021   Grief reaction 05/08/2021   Elevated TSH 11/05/2020   Epiphora due to insufficient drainage of both sides 09/10/2020   Punctal ectropions of both eyes 09/10/2020   Punctal stenosis, acquired, bilateral 09/10/2020   Senile ectropion of both lower eyelids 09/10/2020   History of loop recorder 09/06/2019   History of seizure 03/23/2019   Left-sided epistaxis 11/17/2018   Medicare annual wellness visit, subsequent 09/13/2018   Screening mammogram, encounter for 09/13/2018   Coronary artery disease 12/24/2017   Routine general medical examination at a health care facility 08/24/2016  PAF (paroxysmal atrial fibrillation) (HCC) 04/07/2016   Left knee pain 06/14/2015   Overweight (BMI 25.0-29.9) 08/23/2014   Type 2 diabetes mellitus with diabetic peripheral angiopathy without gangrene, without long-term current use of insulin  (HCC) 01/12/2014   History of CVA (cerebrovascular accident) 01/11/2014   Cardiac device in situ 06/07/2013   Fatigue 06/07/2013   Sinusitis, chronic 05/16/2013   H/O: CVA (cerebrovascular accident) 02/15/2013   Encounter for Medicare annual wellness exam 10/27/2012   Encopresis 12/15/2011   Post-menopausal 10/28/2011   History of colonic polyps 09/04/2009   B12 deficiency 03/06/2009   ALLERGIC RHINITIS 02/14/2008   BACK PAIN, LUMBAR 11/03/2007   Hyperlipidemia associated with type 2 diabetes mellitus (HCC) 11/24/2006   Essential hypertension 11/24/2006   GERD 10/09/2006    OVERACTIVE BLADDER 10/09/2006   INCONTINENCE, URGE 10/09/2006   SKIN CANCER, HX OF 10/09/2006   Past Medical History:  Diagnosis Date   Allergy history, drug    Aspirin   Basal cell carcinoma    "back"   CAD (coronary artery disease)    a. Previously nonobstructive then progressive angina with abnl CT -> cardiac cath 12/24/17 showed 30% mid RCA and 80% prox-mid Cx. She received DES to mid AV groove Cx. EF 55-65%.    Cervical stenosis of spine    With neck pain   CKD (chronic kidney disease), stage II    Colon polyps 2009   Complication of anesthesia 1980s   slow to wake after anesthesia "when I had breast biopsy"   GERD (gastroesophageal reflux disease)    Hyperlipidemia    myalgias with Lipitor and Zetia   Hypertension    Migraine    "stopped at age 89" (12/24/2017)   Squamous carcinoma    "iced off and cut off; mostly arms" (12/24/2017)   Stroke [facility])    "told me I'd had 2 strokes in 02/2013"; denies residual on 12/24/2017   TIA (transient ischemic attack)    Type II diabetes mellitus (HCC)    Past Surgical History:  Procedure Laterality Date   ABD US   07/2003   Negative   APPENDECTOMY     BASAL CELL CARCINOMA EXCISION     "back"   BREAST BIOPSY Left 1992   benign   CARDIAC CATHETERIZATION  04/07/2011   non obst CAD (Dr Arlester Ladd)   CATARACT EXTRACTION W/PHACO Left 04/01/2017   Procedure: CATARACT EXTRACTION PHACO AND INTRAOCULAR LENS PLACEMENT (IOC) LEFT DIABETIC;  Surgeon: Annell Kidney, MD;  Location: [facility];  Service: Ophthalmology;  Laterality: Left;  Diabetic - oral meds   CATARACT EXTRACTION W/PHACO Right 04/29/2017   Procedure: CATARACT EXTRACTION PHACO AND INTRAOCULAR LENS PLACEMENT (IOC) RIGHT DIABETIC;  Surgeon: Annell Kidney, MD;  Location: [facility];  Service: Ophthalmology;  Laterality: Right;  Diabetic - oral meds   COLONOSCOPY  12/2007   Adenomatous colon polyps   CORONARY ANGIOPLASTY WITH STENT PLACEMENT  12/24/2017    CORONARY STENT INTERVENTION N/A 12/24/2017   Procedure: CORONARY STENT INTERVENTION;  Surgeon: Avanell Leigh, MD;  Location: [facility];  Service: Cardiovascular;  Laterality: N/A;   CYSTOSCOPY W/ DECANNULATION  03/2000   Normal   LEFT HEART CATH AND CORONARY ANGIOGRAPHY N/A 12/24/2017   Procedure: LEFT HEART CATH AND CORONARY ANGIOGRAPHY;  Surgeon: Avanell Leigh, MD;  Location: [facility] CV [facility];  Service: Cardiovascular;  Laterality: N/A;   LOOP RECORDER IMPLANT N/A 02/16/2013   Procedure: LOOP RECORDER IMPLANT;  Surgeon: Verona Goodwill, MD;  Location: [facility];  Service: Cardiovascular;  Laterality: N/A;   MOHS SURGERY     right hand    NASAL SINUS SURGERY  01/2005   skin cancer removed  2024   arm and leg   SQUAMOUS CELL CARCINOMA EXCISION     "mostly arms;" (12/24/2017)   STRESS CARDIOLITE   11/1999   Normal/ negative   TEAR DUCT PROBING  2005   "? side"   TEE WITHOUT CARDIOVERSION N/A 02/16/2013   Procedure: TRANSESOPHAGEAL ECHOCARDIOGRAM (TEE);  Surgeon: Loyde Rule, MD;  Location: [facility];  Service: Cardiovascular;  Laterality: N/A;   TUBAL LIGATION     BTL   Social History   Tobacco Use   Smoking status: Never   Smokeless tobacco: Never  Vaping Use   Vaping status: Never Used  Substance Use Topics   Alcohol use: Never    Alcohol/week: 0.0 standard drinks of alcohol   Drug use: Never   Family History  Problem Relation Age of Onset   Lung cancer Brother    Diabetes Brother    Pancreatic cancer Brother    Heart disease Mother    Heart disease Father    Brain cancer Other    Skin cancer Daughter    Diabetes Sister    Breast cancer Sister    Colon cancer Neg Hx    Allergies  Allergen Reactions   Bee Venom Hives, Shortness Of Breath and Swelling   Nabumetone Anaphylaxis   Amoxicillin-Pot Clavulanate Hives and Swelling    To lips.   Aspirin Hives   Atorvastatin Swelling     joint pain/swelling, inc liver tests   Clopidogrel  Bisulfate Hives   Codeine Nausea And Vomiting   Ezetimibe Other (See Comments)     fatigue   Metformin  And Related Other (See Comments)    Diarrhea    Valsartan Other (See Comments)     fatigue   Farxiga  [Dapagliflozin ]     Perineal discomfort  Increased urinary incontinence Fatigue  Joint pain    Jardiance  [Empagliflozin ]     Did not tolerate   Other Hives    **Red Meat**  SOB   Current Outpatient Medications on File Prior to Visit  Medication Sig Dispense Refill   amLODipine  (NORVASC ) 2.5 MG tablet TAKE 1 TABLET(2.5 MG) BY MOUTH EVERY DAY 90 tablet 3   blood glucose meter kit and supplies Dispense based on patient and insurance preference. Use up to four times daily as directed. (FOR ICD-10 E10.9, E11.9). 1 each 0   Blood Glucose Monitoring Suppl (ONE TOUCH ULTRA 2) w/Device KIT Use to check blood sugar once daily 1 each 0   Cyanocobalamin  (VITAMIN B-12 PO) Take 1 tablet by mouth daily.     diphenhydrAMINE  (BENADRYL ) 25 MG tablet Take 50 mg by mouth every 8 (eight) hours as needed for allergies.      ELIQUIS  5 MG TABS tablet TAKE 1 TABLET(5 MG) BY MOUTH TWICE DAILY 60 tablet 5   EPINEPHrine  (EPI-PEN) 0.3 mg/0.3 mL DEVI Inject 0.3 mg into the muscle daily as needed (allergic reaction).     fluticasone (FLONASE) 50 MCG/ACT nasal spray Place 2 sprays into both nostrils 2 (two) times daily as needed. Reported on 09/10/2015     glipiZIDE  (GLUCOTROL  XL) 10 MG 24 hr tablet TAKE 1 TABLET(10 MG) BY MOUTH DAILY WITH BREAKFAST 90 tablet 0   glucose blood (ONETOUCH ULTRA) test strip USE TO CHECK BLOOD SUGAR ONCE DAILY AS DIRECTED 100 strip 1   isosorbide  mononitrate (IMDUR ) 60 MG 24 hr  tablet TAKE 1 TABLET(60 MG) BY MOUTH DAILY 90 tablet 1   metFORMIN  (GLUCOPHAGE -XR) 500 MG 24 hr tablet TAKE 1 TABLET(500 MG) BY MOUTH DAILY WITH BREAKFAST 90 tablet 0   nitroGLYCERIN  (NITROSTAT ) 0.4 MG SL tablet Place 1 tablet (0.4 mg total) under the tongue every 5 (five) minutes as needed for chest pain (up to 3  doses. If taking 3rd dose call 911). 15 tablet 1   propranolol  (INDERAL ) 10 MG tablet Take 1 tablet (10 mg) by mouth every 1 hour x 3 doses as needed for chest pain/ fast heart rates 30 tablet 1   rosuvastatin  (CRESTOR ) 5 MG tablet Take one pill twice weekly 24 tablet 3   terconazole  (TERAZOL 3 ) 0.8 % vaginal cream Use externally daily as needed for yeast infection symptoms 20 g 0   furosemide  (LASIX ) 20 MG tablet Take 1 tablet (20 mg total) by mouth every other day. 90 tablet 3   Current Facility-Administered Medications on File Prior to Visit  Medication Dose Route Frequency Provider Last Rate Last Admin   Study - ORION 4 - inclisiran 300 mg/1.43mL or placebo SQ injection (PI-Stuckey)  300 mg Subcutaneous Q6 months Kristopher Pheasant, MD        Review of Systems  Constitutional:  Negative for activity change, appetite change, fatigue, fever and unexpected weight change.  HENT:  Negative for congestion, ear pain, rhinorrhea, sinus pressure and sore throat.   Eyes:  Negative for pain, redness and visual disturbance.  Respiratory:  Negative for cough, shortness of breath and wheezing.   Cardiovascular:  Negative for chest pain and palpitations.  Gastrointestinal:  Negative for abdominal pain, blood in stool, constipation and diarrhea.  Endocrine: Negative for polydipsia and polyuria.  Genitourinary:  Negative for dysuria, frequency and urgency.  Musculoskeletal:  Negative for arthralgias, back pain and myalgias.  Skin:  Negative for pallor and rash.  Allergic/Immunologic: Negative for environmental allergies.  Neurological:  Positive for light-headedness. Negative for dizziness, tremors, seizures, syncope, facial asymmetry, speech difficulty, weakness, numbness and headaches.       Poor balance briefly upon standing   Hematological:  Negative for adenopathy. Does not bruise/bleed easily.  Psychiatric/Behavioral:  Positive for dysphoric mood. Negative for decreased concentration. The patient is  not nervous/anxious.        Objective:   Physical Exam Constitutional:      General: She is not in acute distress.    Appearance: Normal appearance. She is well-developed and normal weight. She is not ill-appearing or diaphoretic.  HENT:     Head: Normocephalic and atraumatic.  Eyes:     Conjunctiva/sclera: Conjunctivae normal.     Pupils: Pupils are equal, round, and reactive to light.  Neck:     Thyroid : No thyromegaly.     Vascular: No carotid bruit or JVD.  Cardiovascular:     Rate and Rhythm: Normal rate and regular rhythm.     Heart sounds: Normal heart sounds.     No gallop.  Pulmonary:     Effort: Pulmonary effort is normal. No respiratory distress.     Breath sounds: Normal breath sounds. No wheezing or rales.  Abdominal:     General: There is no distension or abdominal bruit.     Palpations: Abdomen is soft.  Musculoskeletal:     Cervical back: Normal range of motion and neck supple.     Right lower leg: No edema.     Left lower leg: No edema.  Lymphadenopathy:     Cervical:  No cervical adenopathy.  Skin:    General: Skin is warm and dry.     Coloration: Skin is not pale.     Findings: No rash.  Neurological:     Mental Status: She is alert.     Cranial Nerves: No cranial nerve deficit.     Motor: No weakness.     Coordination: Coordination normal.     Gait: Gait normal.     Deep Tendon Reflexes: Reflexes are normal and symmetric. Reflexes normal.  Psychiatric:        Mood and Affect: Mood normal.           Assessment & Plan:   Problem List Items Addressed This Visit       Cardiovascular and Mediastinum   Type 2 diabetes mellitus with diabetic peripheral angiopathy without gangrene, without long-term current use of insulin  (HCC) - Primary   A1c pending       Relevant Orders   Hemoglobin A1c   Essential hypertension   bp in fair control at this time  BP Readings from Last 1 Encounters:  11/04/23 122/74  This is usually lower at home and  also in cardiology office  Most recent labs reviewed as well as cardiology note Disc lifstyle change with low sodium diet and exercise  Plans to continue Amlodipine  2.5 mg daily  imdur  60 mg daily  Lasix  20 mg every other day Prn propranolol         Relevant Orders   Lipid panel   Comprehensive metabolic panel with GFR   CBC with Differential/Platelet   TSH     Endocrine   Hyperlipidemia associated with type 2 diabetes mellitus (HCC)   Disc goals for lipids and reasons to control them Rev last labs with pt Rev low sat fat diet in detail Crestor  5 mg twice weekly (most tolerated)  In setting of cardiac dz   Lab today      Relevant Orders   Lipid panel   Comprehensive metabolic panel with GFR   Diabetes mellitus treated with oral medication (HCC)   A1c pending  Metformin  xr 500 mg daily  Glipizide  xl 10 mg daily  No hypoglycemia  Did not tolerate farxiga  from cardiology  Takes statin twice per week  Microalb utd  Good foot care       Relevant Orders   Hemoglobin A1c     Other   Light headed   Today pt c/o light headed feeling and poor balance for several seconds after standing Blood pressure and pulse are baseline/stable   Has had little to no fluid today and suspect mildly dehydrated   Encouraged increase in fluids Report back later this week  Labs pending  Call back and Er precautions noted in detail today        Grief reaction   Candidly discusses symptoms and stressors   Lost husband Good support  Declines grief counseling

## 2023-11-04 NOTE — Assessment & Plan Note (Signed)
 Today pt c/o light headed feeling and poor balance for several seconds after standing Blood pressure and pulse are baseline/stable   Has had little to no fluid today and suspect mildly dehydrated   Encouraged increase in fluids Report back later this week  Labs pending  Call back and Er precautions noted in detail today

## 2023-11-05 ENCOUNTER — Encounter: Payer: Self-pay | Admitting: Family Medicine

## 2023-11-05 LAB — COMPREHENSIVE METABOLIC PANEL WITH GFR
ALT: 12 U/L (ref 0–35)
AST: 16 U/L (ref 0–37)
Albumin: 3.9 g/dL (ref 3.5–5.2)
Alkaline Phosphatase: 71 U/L (ref 39–117)
BUN: 16 mg/dL (ref 6–23)
CO2: 28 meq/L (ref 19–32)
Calcium: 9.3 mg/dL (ref 8.4–10.5)
Chloride: 104 meq/L (ref 96–112)
Creatinine, Ser: 1.03 mg/dL (ref 0.40–1.20)
GFR: 49.38 mL/min — ABNORMAL LOW (ref >60.00)
Glucose, Bld: 75 mg/dL (ref 70–99)
Potassium: 4.4 meq/L (ref 3.5–5.1)
Sodium: 139 meq/L (ref 135–145)
Total Bilirubin: 0.5 mg/dL (ref 0.2–1.2)
Total Protein: 6.8 g/dL (ref 6.0–8.3)

## 2023-11-05 LAB — CBC WITH DIFFERENTIAL/PLATELET
Basophils Absolute: 0 10*3/uL (ref 0.0–0.1)
Basophils Relative: 0.7 % (ref 0.0–3.0)
Eosinophils Absolute: 0.2 10*3/uL (ref 0.0–0.7)
Eosinophils Relative: 2.4 % (ref 0.0–5.0)
HCT: 42.1 % (ref 36.0–46.0)
Hemoglobin: 13.9 g/dL (ref 12.0–15.0)
Lymphocytes Relative: 31.2 % (ref 12.0–46.0)
Lymphs Abs: 2.1 10*3/uL (ref 0.7–4.0)
MCHC: 33.1 g/dL (ref 30.0–36.0)
MCV: 96.7 fl (ref 78.0–100.0)
Monocytes Absolute: 0.5 10*3/uL (ref 0.1–1.0)
Monocytes Relative: 8.2 % (ref 3.0–12.0)
Neutro Abs: 3.8 10*3/uL (ref 1.4–7.7)
Neutrophils Relative %: 57.5 % (ref 43.0–77.0)
Platelets: 217 10*3/uL (ref 150.0–400.0)
RBC: 4.35 Mil/uL (ref 3.87–5.11)
RDW: 13.7 % (ref 11.5–15.5)
WBC: 6.6 10*3/uL (ref 4.0–10.5)

## 2023-11-05 LAB — LIPID PANEL
Cholesterol: 168 mg/dL (ref 0–200)
HDL: 42 mg/dL (ref >39.00)
LDL Cholesterol: 92 mg/dL (ref 0–99)
NonHDL: 126.08
Total CHOL/HDL Ratio: 4
Triglycerides: 172 mg/dL — ABNORMAL HIGH (ref 0.0–149.0)
VLDL: 34.4 mg/dL (ref 0.0–40.0)

## 2023-11-05 LAB — TSH: TSH: 2.75 u[IU]/mL (ref 0.35–5.50)

## 2023-11-05 LAB — HEMOGLOBIN A1C: Hgb A1c MFr Bld: 6.4 % (ref 4.6–6.5)

## 2023-11-10 ENCOUNTER — Telehealth: Payer: Self-pay | Admitting: Internal Medicine

## 2023-11-10 NOTE — Telephone Encounter (Signed)
 Pt having left shoulder pain and would like a c/b before going to the hospital. Please advise

## 2023-11-10 NOTE — Telephone Encounter (Signed)
 Pt returning call

## 2023-11-10 NOTE — Telephone Encounter (Signed)
 Left voicemail message to call back

## 2023-11-11 NOTE — Telephone Encounter (Signed)
LVM to call back to discuss.   Left call back number.   

## 2023-11-13 NOTE — Telephone Encounter (Signed)
Attempted to contact patient, LVM to call back.  Left call back number.   

## 2024-01-11 DIAGNOSIS — C44319 Basal cell carcinoma of skin of other parts of face: Secondary | ICD-10-CM | POA: Diagnosis not present

## 2024-01-11 DIAGNOSIS — D0439 Carcinoma in situ of skin of other parts of face: Secondary | ICD-10-CM | POA: Diagnosis not present

## 2024-01-11 DIAGNOSIS — D049 Carcinoma in situ of skin, unspecified: Secondary | ICD-10-CM | POA: Diagnosis not present

## 2024-01-11 DIAGNOSIS — C44329 Squamous cell carcinoma of skin of other parts of face: Secondary | ICD-10-CM | POA: Diagnosis not present

## 2024-01-18 DIAGNOSIS — D0462 Carcinoma in situ of skin of left upper limb, including shoulder: Secondary | ICD-10-CM | POA: Diagnosis not present

## 2024-01-18 DIAGNOSIS — D0461 Carcinoma in situ of skin of right upper limb, including shoulder: Secondary | ICD-10-CM | POA: Diagnosis not present

## 2024-01-25 ENCOUNTER — Emergency Department: Admission: EM | Admit: 2024-01-25 | Discharge: 2024-01-25 | Disposition: A | Discharge status: Home / Self Care

## 2024-01-25 ENCOUNTER — Other Ambulatory Visit: Payer: Self-pay

## 2024-01-25 ENCOUNTER — Emergency Department

## 2024-01-25 DIAGNOSIS — R110 Nausea: Secondary | ICD-10-CM | POA: Diagnosis not present

## 2024-01-25 DIAGNOSIS — R0789 Other chest pain: Secondary | ICD-10-CM | POA: Diagnosis not present

## 2024-01-25 DIAGNOSIS — I2510 Atherosclerotic heart disease of native coronary artery without angina pectoris: Secondary | ICD-10-CM | POA: Diagnosis not present

## 2024-01-25 DIAGNOSIS — R079 Chest pain, unspecified: Secondary | ICD-10-CM

## 2024-01-25 DIAGNOSIS — R072 Precordial pain: Secondary | ICD-10-CM | POA: Diagnosis not present

## 2024-01-25 DIAGNOSIS — I4891 Unspecified atrial fibrillation: Secondary | ICD-10-CM | POA: Diagnosis not present

## 2024-01-25 DIAGNOSIS — I10 Essential (primary) hypertension: Secondary | ICD-10-CM | POA: Insufficient documentation

## 2024-01-25 LAB — HEPATIC FUNCTION PANEL
ALT: 12 U/L (ref 0–44)
AST: 18 U/L (ref 15–41)
Albumin: 3.6 g/dL (ref 3.5–5.0)
Alkaline Phosphatase: 65 U/L (ref 38–126)
Bilirubin, Direct: <0.1 mg/dL (ref 0.0–0.2)
Total Bilirubin: 0.6 mg/dL (ref 0.0–1.2)
Total Protein: 6.9 g/dL (ref 6.5–8.1)

## 2024-01-25 LAB — CBC
HCT: 40.7 % (ref 36.0–46.0)
Hemoglobin: 13.9 g/dL (ref 12.0–15.0)
MCH: 32.1 pg (ref 26.0–34.0)
MCHC: 34.2 g/dL (ref 30.0–36.0)
MCV: 94 fL (ref 80.0–100.0)
Platelets: 215 K/uL (ref 150–400)
RBC: 4.33 MIL/uL (ref 3.87–5.11)
RDW: 12.6 % (ref 11.5–15.5)
WBC: 7.8 K/uL (ref 4.0–10.5)
nRBC: 0 % (ref 0.0–0.2)

## 2024-01-25 LAB — BASIC METABOLIC PANEL WITH GFR
Anion gap: 8 (ref 5–15)
BUN: 18 mg/dL (ref 8–23)
CO2: 26 mmol/L (ref 22–32)
Calcium: 8.9 mg/dL (ref 8.9–10.3)
Chloride: 105 mmol/L (ref 98–111)
Creatinine, Ser: 1.02 mg/dL — ABNORMAL HIGH (ref 0.44–1.00)
GFR, Estimated: 54 mL/min — ABNORMAL LOW (ref >60)
Glucose, Bld: 85 mg/dL (ref 70–99)
Potassium: 4.2 mmol/L (ref 3.5–5.1)
Sodium: 139 mmol/L (ref 135–145)

## 2024-01-25 LAB — TROPONIN I (HIGH SENSITIVITY)
Troponin I (High Sensitivity): 5 ng/L (ref <18)
Troponin I (High Sensitivity): 5 ng/L (ref <18)

## 2024-01-25 LAB — LIPASE, BLOOD: Lipase: 28 U/L (ref 11–51)

## 2024-01-25 LAB — BRAIN NATRIURETIC PEPTIDE: B Natriuretic Peptide: 248.6 pg/mL — ABNORMAL HIGH (ref 0.0–100.0)

## 2024-01-25 NOTE — ED Notes (Signed)
 Pt assisted to the bathroom with standby assist. Pt with steady gait.

## 2024-01-25 NOTE — ED Provider Notes (Signed)
 [facility] Provider Note    None    (approximate)   History   Chest Pain   HPI  Margaret Vazquez is a 88 y.o. female with a past medical history of cryptogenic stroke, hypertension, coronary artery disease with stents, atrial fibrillation, implantable loop recorder, followed by [facility] health cardiology presenting to the emergency department with an episode of chest pain.  Patient reports compliance with all of her medications including her blood thinner and was active today running errands.  Pain initiated while waiting in line for 15 minutes at a bank (after she felt frustrated with the wait).  There was no associated shortness of breath but there was associated nausea.  Pain was midsternal and radiated down her arm.  She describes the pain is very different from her MI pain.  When the pain initiated it was an 8 currently she is symptom-free.  She denies any recent illness cough congestion, abdominal pain currently changes in urinary or bowel habits.  Patient's daughter presents with her and contributes to the history   Patient is allergic to aspirin and prefers not to take this     Physical Exam   Triage Vital Signs: ED Triage Vitals  Encounter Vitals Group     BP 01/25/24 1615 (!) 155/76     Girls Systolic BP Percentile --      Girls Diastolic BP Percentile --      Boys Systolic BP Percentile --      Boys Diastolic BP Percentile --      Pulse Rate 01/25/24 1615 61     Resp 01/25/24 1615 18     Temp 01/25/24 1615 97.6 F (36.4 C)     Temp Source 01/25/24 1615 Oral     SpO2 01/25/24 1615 96 %     Weight 01/25/24 1617 185 lb (83.9 kg)     Height 01/25/24 1617 5' 9 (1.753 m)     Head Circumference --      Peak Flow --      Pain Score 01/25/24 1617 6     Pain Loc --      Pain Education --      Exclude from Growth Chart --     Most recent vital signs: Vitals:   01/25/24 1830 01/25/24 1900  BP: (!) 160/78 (!) 177/71  Pulse: (!) 59 (!) 59  Resp:  18 18  Temp:    SpO2: 100% 100%    Nursing Triage Note reviewed. Vital signs reviewed and patients oxygen saturation is normoxic  General: Patient is well nourished, well developed, awake and alert, resting comfortably in no acute distress Head: Normocephalic and atraumatic Eyes: Normal inspection, extraocular muscles intact, no conjunctival pallor Ear, nose, throat: Normal external exam Neck: Normal range of motion Respiratory: Patient is in no respiratory distress, lungs CTAB Cardiovascular: Patient is not tachycardic, RRR without murmur appreciated GI: Abd SNT with no guarding or rebound  Back: Normal inspection of the back with good strength and range of motion throughout all ext Extremities: pulses intact with good cap refills, no LE pitting edema or calf tenderness Neuro: The patient is alert and oriented to person, place, and time, appropriately conversive, with 5/5 bilat UE/LE strength, no gross motor or sensory defects noted. Coordination appears to be adequate. Skin: Warm, dry, and intact Psych: normal mood and affect, no SI or HI  ED Results / Procedures / Treatments   Labs (all labs ordered are listed, but only abnormal results are  displayed) Labs Reviewed  BASIC METABOLIC PANEL WITH GFR - Abnormal; Notable for the following components:      Result Value   Creatinine, Ser 1.02 (*)    GFR, Estimated 54 (*)    All other components within normal limits  BRAIN NATRIURETIC PEPTIDE - Abnormal; Notable for the following components:   B Natriuretic Peptide 248.6 (*)    All other components within normal limits  CBC  HEPATIC FUNCTION PANEL  LIPASE, BLOOD  TROPONIN I (HIGH SENSITIVITY)  TROPONIN I (HIGH SENSITIVITY)     EKG EKG and rhythm strip are interpreted by myself:   EKG: [Normal sinus rhythm] at heart rate of 66, normal QRS duration, QTc 408, normal  ST segments and T waves no ectopy EKG not consistent with Acute STEMI Rhythm strip: NSR in lead  II   RADIOLOGY XR chest: No acute abnormality on my independent review and interpretation and radiologist agrees    PROCEDURES:  Critical Care performed: No  Procedures   MEDICATIONS ORDERED IN ED: Medications - No data to display   IMPRESSION / MDM / ASSESSMENT AND PLAN / ED COURSE           HEART Score: 5                     Differential diagnosis includes, but is not limited to, ACS, pneumonia, GERD, arrhythmia,  ED course: Patient arrives and EKG demonstrates no evidence of acute ischemia.  Chest x-ray demonstrated no pneumothorax or pneumonia.  Initial troponin is not elevated and patient is not anemic and without any profound electrolyte derangements.  Case briefly discussed with cardiology consultant given patient's indeterminate heart score and patient was consequently offered admission.  She would prefer to return home today if her repeat troponin is not elevated.  She feels comfortable following up with her outpatient team.  I will place a urgent referral to outpatient cardiology.  At time of discharge there is no evidence of acute life, limb, vision, or fertility threat. Patient has stable vital signs, pain is well controlled, patient is ambulatory and p.o. tolerant.  Discharge instructions were completed using the [employer] system. I would refer you to those at this time. All warnings prescriptions follow-up etc. were discussed in detail with the patient. Patient indicates understanding and is agreeable with this plan. All questions answered.  Patient is made aware that they may return to the emergency department for any worsening or new condition or for any other emergency.   Clinical Course as of 01/25/24 2356  Mon Jan 25, 2024  1654 EKG 12-Lead [HD]  1728 Troponin I (High Sensitivity): 5 Not elevated [HD]  1728 WBC: 7.8 No leukocytosis [HD]  1728 Patient without any chest pain [HD]  1728 Will plan on touching base with patient's cardiology team [HD]  1826  Patient continues to be chest pain-free.  I updated the patient regarding her results thus far including her troponin not elevated and she and her daughter voiced understanding.  I briefly discussed the patient via epic chat with on-call cardiologist Dr. Pietro and he stated that if patient is concerned she would be okay to admit to the hospitalist.  I counseled the patient regarding this and patient states adamantly that she would want to return home today and that she lives on a 3 miles from the ED and will return with any acutely worsening symptoms.  Given this if repeat troponin is not elevated we will put in an urgent outpatient referral  to cardiology and plan for discharge as I certainly want to hold the patient here against her will.  Patient's daughter in agreement with the plan [HD]  1902 Troponin I (High Sensitivity): 5 [HD]    Clinical Course User Index [HD] Nicholaus Rolland BRAVO, MD   Risk: 5 This patient has a high risk of morbidity due to further diagnostic testing or treatment. Rationale: This patient's evaluation and management involve a high risk of morbidity due to the potential severity of presenting symptoms, need for diagnostic testing, and/or initiation of treatment that may require close monitoring. The differential includes conditions with potential for significant deterioration or requiring escalation of care. Treatment decisions in the ED, including medication administration, procedural interventions, or disposition planning, reflect this level of risk. Additional Support: -- Drug therapy requiring intensive monitoring for toxicity [ ]  -- Decision regarding elective major surgery with idenitified patient or procedure risk factors [ ]  -- Decision regarding hospitalization or escalation of hospital-level care [ ]  -- Decision not to resuscitate or to de-escalate care because of poor prognosis [ ]  -- Parental controlled substances [ ]   COPA: 5 The patient has a severe exacerbation,  progression, or side effect of treatment of the following illness/illnesses: []  OR  The patient has the following acute or chronic illness/injury that poses a possible threat to life or bodily function: [X] : The patient has a potentially serious acute condition or an acute exacerbation of a chronic illness requiring urgent evaluation and management in the Emergency Department. The clinical presentation necessitates immediate consideration of life-threatening or function-threatening diagnoses, even if they are ultimately ruled out.  Data(2/3 categories following were performed): 5 I reviewed or ordered at least three unique tests, external notes, and/or the history required an independent historian as one of the three requirements as following: CBC, CMP, troponin x 2, patient's daughter at bedside AND  I independently interpreted the following test: X-ray chest OR  I discussed the management of the patient with the following external physician or qualified healthcare provider: Cardiology consult    Suggested E/M Coding Level: 5, 99285, This has been selected based on the 20-Feb-2022 CPT guidelines for E/M codes in the Emergency Department based on 2/3 of the CoPA, Data, and Risk.    FINAL CLINICAL IMPRESSION(S) / ED DIAGNOSES   Final diagnoses:  Chest pain, unspecified type     Rx / DC Orders   ED Discharge Orders          Ordered    Ambulatory referral to Cardiology       Comments: If you have not heard from the Cardiology office within the next 72 hours please call [phone].   01/25/24 1831             Note:  This document was prepared using Dragon voice recognition software and may include unintentional dictation errors.   Nicholaus Rolland BRAVO, MD 01/25/24 [id number]

## 2024-01-25 NOTE — Discharge Instructions (Addendum)
 You were seen in the emergency department after an episode of chest pain.  Workup today was reassuring.  You were offered admission but preferred to return home.  You should receive a call to schedule an urgent cardiology follow-up.  Please also follow-up with your primary care physician.  Continue to take your regular medications.  Please return if any acutely worsening symptoms or any other emergency.  It was very nice meeting you and I wish you the best of luck! Rolland Moats, MD, PhD -- RETURN PRECAUTIONS & AFTERCARE: (ENGLISH) RETURN PRECAUTIONS: Return immediately to the emergency department or see/call your doctor if you feel worse, weak or have changes in speech or vision, are short of breath, have fever, vomiting, pain, bleeding or dark stool, trouble urinating or any new issues. Return here or see/call your doctor if not improving as expected for your suspected condition. FOLLOW-UP CARE: Call your doctor and/or any doctors we referred you to for more advice and to make an appointment. Do this today, tomorrow or after the weekend. Some doctors only take PPO insurance so if you have HMO insurance you may want to contact your HMO or your regular doctor for referral to a specialist within your plan. Either way tell the doctor's office that it was a referral from the emergency department so you get the soonest possible appointment.  YOUR TEST RESULTS: Take result reports of any blood or urine tests, imaging tests and EKG's to your doctor and any referral doctor. Have any abnormal tests repeated. Your doctor or a referral doctor can let you know when this should be done. Also make sure your doctor contacts this hospital to get any test results that are not currently available such as cultures or special tests for infection and final imaging reports, which are often not available at the time you leave the ER but which may list additional important findings that are not documented on the preliminary  report. BLOOD PRESSURE: If your blood pressure was greater than 120/80 have your blood pressure rechecked within 1 to 2 weeks. MEDICATION SIDE EFFECTS: Do not drive, walk, bike, take the bus, etc. if you have received or are being prescribed any sedating medications such as those for pain or anxiety or certain antihistamines like Benadryl . If you have been give one of these here get a taxi home or have a friend drive you home. Ask your pharmacist to counsel you on potential side effects of any new medication

## 2024-01-25 NOTE — ED Triage Notes (Signed)
 Pt with a hx of CAD presents with sudden onset of a heavy L sided CP that radiated to her back while she was standing in the bank. Pain has been constant and unchanged. Pt initially became clammy and had nausea. She has had frequent eructations. Denies ShOB. Pt has tried taking Pepcid  without relief.

## 2024-01-27 ENCOUNTER — Encounter: Payer: Self-pay | Admitting: Family Medicine

## 2024-01-27 ENCOUNTER — Ambulatory Visit (INDEPENDENT_AMBULATORY_CARE_PROVIDER_SITE_OTHER): Admitting: Family Medicine

## 2024-01-27 VITALS — BP 116/65 | HR 76 | Temp 98.2°F | Ht 69.0 in | Wt 186.1 lb

## 2024-01-27 DIAGNOSIS — I10 Essential (primary) hypertension: Secondary | ICD-10-CM | POA: Diagnosis not present

## 2024-01-27 DIAGNOSIS — E1169 Type 2 diabetes mellitus with other specified complication: Secondary | ICD-10-CM

## 2024-01-27 DIAGNOSIS — R079 Chest pain, unspecified: Secondary | ICD-10-CM | POA: Insufficient documentation

## 2024-01-27 DIAGNOSIS — R0789 Other chest pain: Secondary | ICD-10-CM

## 2024-01-27 DIAGNOSIS — E1151 Type 2 diabetes mellitus with diabetic peripheral angiopathy without gangrene: Secondary | ICD-10-CM

## 2024-01-27 DIAGNOSIS — E785 Hyperlipidemia, unspecified: Secondary | ICD-10-CM | POA: Diagnosis not present

## 2024-01-27 DIAGNOSIS — Z7984 Long term (current) use of oral hypoglycemic drugs: Secondary | ICD-10-CM | POA: Diagnosis not present

## 2024-01-27 NOTE — Patient Instructions (Addendum)
 You can take isosorbide  in the am and amlodipine  in the pm or vice versa    Follow up with cardiology as planned  If chest pain returns - let everyone know  Go to ER if persistent or severe   Take care of yourself  Eat healthy -keep working on that   Follow up here for annual exam in sept or oct

## 2024-01-27 NOTE — Assessment & Plan Note (Signed)
 Disc goals for lipids and reasons to control them Rev last labs with pt Rev low sat fat diet in detail Crestor  5 mg twice weekly (most tolerated)  In setting of cardiac dz   Last LDL 92 Watched by cardiology

## 2024-01-27 NOTE — Assessment & Plan Note (Signed)
 Lab Results  Component Value Date   HGBA1C 6.4 11/04/2023   HGBA1C 5.9 (A) 04/27/2023   HGBA1C 6.6 (H) 01/12/2023   Metformin  xr 500 mg daily Glipizide  xl 10 mg - no low glucose issues

## 2024-01-27 NOTE — Assessment & Plan Note (Signed)
 Seen in ER for left chest pain rad to arm and back on 7/21 Reviewed hospital records, lab results and studies in detail   Reassuring work up with normal troponin and keg and cxr  Labs unremarkable This resolved after 4 h ? Of cause No re occurrence  Normal exam today   Planning follow up with cardiology / has history of CAD  In meantime instructed to call/go to ER if needed if this re occurs  Encouraged healthy habits

## 2024-01-27 NOTE — Assessment & Plan Note (Signed)
 bp in fair control at this time  BP Readings from Last 1 Encounters:  01/27/24 116/65  This is usually lower at home and also in cardiology office  Most recent labs reviewed as well as cardiology note Disc lifstyle change with low sodium diet and exercise  Plans to continue Amlodipine  2.5 mg daily  imdur  60 mg daily  Informed pt she can take amlodipine  in am and imdur  in pm or vice versa if preferred  Lasix  20 mg every other day Prn propranolol 

## 2024-01-27 NOTE — Progress Notes (Signed)
 Subjective:    Patient ID: Margaret Vazquez, female    DOB: [birth year], 88 y.o.   MRN: [id number]  HPI  Wt Readings from Last 3 Encounters:  01/27/24 186 lb 2 oz (84.4 kg)  01/25/24 185 lb (83.9 kg)  11/04/23 184 lb 6.4 oz (83.6 kg)   27.49 kg/m  Vitals:   01/27/24 1036 01/27/24 1058  BP: (!) 126/58 116/65  Pulse: 76   Temp: 98.2 F (36.8 C)   SpO2: 95%      Pt presents for follow up of ER visit on 01/25/24 for chest pain   Pmx sig for CAD, HTN, PAF and DM2  Mid sternal pain rad down arm and left side of back  -occurred waiting in line at bank  No shortness of breath/nausea but felt clammy   Lasted about 4 hours  No heartburn  Burped a few times    Reassuring EKG and cxr   EKG: [Normal sinus rhythm] at heart rate of 66, normal QRS duration, QTc 408, normal  ST segments and T waves no ectopy EKG not consistent with Acute STEMI Rhythm strip: NSR in lead II  DG Chest 2 View Result Date: 01/25/2024 CLINICAL DATA:  Left-sided chest pain. Nausea. Coronary artery disease. EXAM: CHEST - 2 VIEW COMPARISON:  04/20/2022 FINDINGS: The heart size and mediastinal contours are within normal limits. Cardiac loop recorder again seen. Mild left basilar scarring is unchanged. The lungs are otherwise clear. The visualized skeletal structures are unremarkable. IMPRESSION: No active cardiopulmonary disease. Electronically Signed   By: Norleen DELENA Kil M.D.   On: 01/25/2024 16:51    Normal troponin   Pt declined admission and was urgently ref to outpt cardiology   Lab Results  Component Value Date   NA 139 01/25/2024   K 4.2 01/25/2024   CO2 26 01/25/2024   GLUCOSE 85 01/25/2024   BUN 18 01/25/2024   CREATININE 1.02 (H) 01/25/2024   CALCIUM  8.9 01/25/2024   GFR 49.38 (L) 11/04/2023   GFRNONAA 54 (L) 01/25/2024   Lab Results  Component Value Date   ALT 12 01/25/2024   AST 18 01/25/2024   ALKPHOS 65 01/25/2024   BILITOT 0.6 01/25/2024   BNP 248.6 Lab Results  Component Value  Date   LIPASE 28 01/25/2024   Lab Results  Component Value Date   WBC 7.8 01/25/2024   HGB 13.9 01/25/2024   HCT 40.7 01/25/2024   MCV 94.0 01/25/2024   PLT 215 01/25/2024     HTN bp is stable today  No cp or palpitations or headaches or edema  No side effects to medicines  BP Readings from Last 3 Encounters:  01/27/24 116/65  01/25/24 (!) 177/71  11/04/23 122/74   Amlodipine  2.5 mg daily  imdur  60 mg daily  Lasix  20 mg every other day Prn propranolol   Pulse Readings from Last 3 Encounters:  01/27/24 76  01/25/24 (!) 59  11/04/23 67   Taking eliquis  for a fib    Lab Results  Component Value Date   HGBA1C 6.4 11/04/2023   HGBA1C 5.9 (A) 04/27/2023   HGBA1C 6.6 (H) 01/12/2023      Lab Results  Component Value Date   NA 139 01/25/2024   K 4.2 01/25/2024   CO2 26 01/25/2024   GLUCOSE 85 01/25/2024   BUN 18 01/25/2024   CREATININE 1.02 (H) 01/25/2024   CALCIUM  8.9 01/25/2024   GFR 49.38 (L) 11/04/2023   GFRNONAA 54 (L) 01/25/2024    Lipids  Lab Results  Component Value Date   CHOL 168 11/04/2023   HDL 42.00 11/04/2023   LDLCALC 92 11/04/2023   LDLDIRECT 119.0 05/01/2021   TRIG 172.0 (H) 11/04/2023   CHOLHDL 4 11/04/2023   Crestor  5 mg twice weekly  Patient Active Problem List   Diagnosis Date Noted   Chest pain 01/27/2024   Light headed 11/04/2023   Blurred vision 04/27/2023   Diabetes mellitus treated with oral medication (HCC) 04/27/2023   Side effect of medication 02/13/2023   Estrogen deficiency 11/07/2021   Grief reaction 05/08/2021   Elevated TSH 11/05/2020   Epiphora due to insufficient drainage of both sides 09/10/2020   Punctal ectropions of both eyes 09/10/2020   Punctal stenosis, acquired, bilateral 09/10/2020   Senile ectropion of both lower eyelids 09/10/2020   History of loop recorder 09/06/2019   History of seizure 03/23/2019   Left-sided epistaxis 11/17/2018   Medicare annual wellness visit, subsequent 09/13/2018    Screening mammogram, encounter for 09/13/2018   Coronary artery disease 12/24/2017   Routine general medical examination at a health care facility 08/24/2016   PAF (paroxysmal atrial fibrillation) (HCC) 04/07/2016   Left knee pain 06/14/2015   Overweight (BMI 25.0-29.9) 08/23/2014   Type 2 diabetes mellitus with diabetic peripheral angiopathy without gangrene, without long-term current use of insulin  (HCC) 01/12/2014   History of CVA (cerebrovascular accident) 01/11/2014   Cardiac device in situ 06/07/2013   Fatigue 06/07/2013   Sinusitis, chronic 05/16/2013   H/O: CVA (cerebrovascular accident) 02/15/2013   Encounter for Medicare annual wellness exam 10/27/2012   Encopresis 12/15/2011   Post-menopausal 10/28/2011   History of colonic polyps 09/04/2009   B12 deficiency 03/06/2009   ALLERGIC RHINITIS 02/14/2008   BACK PAIN, LUMBAR 11/03/2007   Hyperlipidemia associated with type 2 diabetes mellitus (HCC) 11/24/2006   Essential hypertension 11/24/2006   GERD 10/09/2006   OVERACTIVE BLADDER 10/09/2006   INCONTINENCE, URGE 10/09/2006   SKIN CANCER, HX OF 10/09/2006   Past Medical History:  Diagnosis Date   Allergy history, drug    Aspirin   Basal cell carcinoma    back   CAD (coronary artery disease)    a. Previously nonobstructive then progressive angina with abnl CT -> cardiac cath 12/24/17 showed 30% mid RCA and 80% prox-mid Cx. She received DES to mid AV groove Cx. EF 55-65%.    Cervical stenosis of spine    With neck pain   CKD (chronic kidney disease), stage II    Colon polyps 2009   Complication of anesthesia 1980s   slow to wake after anesthesia when I had breast biopsy   GERD (gastroesophageal reflux disease)    Hyperlipidemia    myalgias with Lipitor and Zetia   Hypertension    Migraine    stopped at age 30 (12/24/2017)   Squamous carcinoma    iced off and cut off; mostly arms (12/24/2017)   Stroke [facility])    told me I'd had 2 strokes in 02/2013; denies  residual on 12/24/2017   TIA (transient ischemic attack)    Type II diabetes mellitus (HCC)    Past Surgical History:  Procedure Laterality Date   ABD US   07/2003   Negative   APPENDECTOMY     BASAL CELL CARCINOMA EXCISION     back   BREAST BIOPSY Left 1992   benign   CARDIAC CATHETERIZATION  04/07/2011   non obst CAD (Dr Wonda)   CATARACT EXTRACTION W/PHACO Left 04/01/2017   Procedure: CATARACT EXTRACTION PHACO AND  INTRAOCULAR LENS PLACEMENT (IOC) LEFT DIABETIC;  Surgeon: Mittie Gaskin, MD;  Location: [facility];  Service: Ophthalmology;  Laterality: Left;  Diabetic - oral meds   CATARACT EXTRACTION W/PHACO Right 04/29/2017   Procedure: CATARACT EXTRACTION PHACO AND INTRAOCULAR LENS PLACEMENT (IOC) RIGHT DIABETIC;  Surgeon: Mittie Gaskin, MD;  Location: [facility];  Service: Ophthalmology;  Laterality: Right;  Diabetic - oral meds   COLONOSCOPY  12/2007   Adenomatous colon polyps   CORONARY ANGIOPLASTY WITH STENT PLACEMENT  12/24/2017   CORONARY STENT INTERVENTION N/A 12/24/2017   Procedure: CORONARY STENT INTERVENTION;  Surgeon: Court Dorn PARAS, MD;  Location: [facility] INVASIVE CV LAB;  Service: Cardiovascular;  Laterality: N/A;   CYSTOSCOPY W/ DECANNULATION  03/2000   Normal   LEFT HEART CATH AND CORONARY ANGIOGRAPHY N/A 12/24/2017   Procedure: LEFT HEART CATH AND CORONARY ANGIOGRAPHY;  Surgeon: Court Dorn PARAS, MD;  Location: [facility]VAS[facility] CV [facility];  Service: Cardiovascular;  Laterality: N/A;   LOOP RECORDER IMPLANT N/A 02/16/2013   Procedure: LOOP RECORDER IMPLANT;  Surgeon: Elspeth JAYSON Sage, MD;  Location: [facility] C[facility];  Service: Cardiovascular;  Laterality: N/A;   MOHS SURGERY     right hand    NASAL SINUS SURGERY  01/2005   skin cancer removed  2024   arm and leg   SQUAMOUS CELL CARCINOMA EXCISION     mostly arms; (12/24/2017)   STRESS CARDIOLITE   11/1999   Normal/ negative   TEAR DUCT PROBING  2005   ? side   TEE WITHOUT CARDIOVERSION N/A  02/16/2013   Procedure: TRANSESOPHAGEAL ECHOCARDIOGRAM (TEE);  Surgeon: Maude JAYSON Emmer, MD;  Location: [facility] ENDOSCOPY;  Service: Cardiovascular;  Laterality: N/A;   TUBAL LIGATION     BTL   Social History   Tobacco Use   Smoking status: Never   Smokeless tobacco: Never  Vaping Use   Vaping status: Never Used  Substance Use Topics   Alcohol use: Never    Alcohol/week: 0.0 standard drinks of alcohol   Drug use: Never   Family History  Problem Relation Age of Onset   Lung cancer Brother    Diabetes Brother    Pancreatic cancer Brother    Heart disease Mother    Heart disease Father    Brain cancer Other    Skin cancer Daughter    Diabetes Sister    Breast cancer Sister    Colon cancer Neg Hx    Allergies  Allergen Reactions   Bee Venom Hives, Shortness Of Breath and Swelling   Nabumetone Anaphylaxis   Amoxicillin-Pot Clavulanate Hives and Swelling    To lips.   Aspirin Hives   Atorvastatin Swelling     joint pain/swelling, inc liver tests   Clopidogrel Bisulfate Hives   Codeine Nausea And Vomiting   Ezetimibe Other (See Comments)     fatigue   Metformin  And Related Other (See Comments)    Diarrhea    Valsartan Other (See Comments)     fatigue   Farxiga  [Dapagliflozin ]     Perineal discomfort  Increased urinary incontinence Fatigue  Joint pain    Jardiance  [Empagliflozin ]     Did not tolerate   Other Hives    **Red Meat**  SOB   Current Outpatient Medications on File Prior to Visit  Medication Sig Dispense Refill   amLODipine  (NORVASC ) 2.5 MG tablet TAKE 1 TABLET(2.5 MG) BY MOUTH EVERY DAY 90 tablet 3   blood glucose meter kit and supplies Dispense based on patient and insurance  preference. Use up to four times daily as directed. (FOR ICD-10 E10.9, E11.9). 1 each 0   Blood Glucose Monitoring Suppl (ONE TOUCH ULTRA 2) w/Device KIT Use to check blood sugar once daily 1 each 0   Cyanocobalamin  (VITAMIN B-12 PO) Take 1 tablet by mouth daily.     diphenhydrAMINE   (BENADRYL ) 25 MG tablet Take 50 mg by mouth every 8 (eight) hours as needed for allergies.      ELIQUIS  5 MG TABS tablet TAKE 1 TABLET(5 MG) BY MOUTH TWICE DAILY 60 tablet 5   EPINEPHrine  (EPI-PEN) 0.3 mg/0.3 mL DEVI Inject 0.3 mg into the muscle daily as needed (allergic reaction).     fluticasone (FLONASE) 50 MCG/ACT nasal spray Place 2 sprays into both nostrils 2 (two) times daily as needed. Reported on 09/10/2015     furosemide  (LASIX ) 20 MG tablet Take 1 tablet (20 mg total) by mouth every other day. 90 tablet 3   glipiZIDE  (GLUCOTROL  XL) 10 MG 24 hr tablet TAKE 1 TABLET(10 MG) BY MOUTH DAILY WITH BREAKFAST 90 tablet 0   glucose blood (ONETOUCH ULTRA) test strip USE TO CHECK BLOOD SUGAR ONCE DAILY AS DIRECTED 100 strip 1   isosorbide  mononitrate (IMDUR ) 60 MG 24 hr tablet TAKE 1 TABLET(60 MG) BY MOUTH DAILY 90 tablet 1   metFORMIN  (GLUCOPHAGE -XR) 500 MG 24 hr tablet TAKE 1 TABLET(500 MG) BY MOUTH DAILY WITH BREAKFAST 90 tablet 0   nitroGLYCERIN  (NITROSTAT ) 0.4 MG SL tablet Place 1 tablet (0.4 mg total) under the tongue every 5 (five) minutes as needed for chest pain (up to 3 doses. If taking 3rd dose call 911). 15 tablet 1   propranolol  (INDERAL ) 10 MG tablet Take 1 tablet (10 mg) by mouth every 1 hour x 3 doses as needed for chest pain/ fast heart rates 30 tablet 1   rosuvastatin  (CRESTOR ) 5 MG tablet Take one pill twice weekly 24 tablet 3   terconazole  (TERAZOL 3 ) 0.8 % vaginal cream Use externally daily as needed for yeast infection symptoms 20 g 0   Current Facility-Administered Medications on File Prior to Visit  Medication Dose Route Frequency Provider Last Rate Last Admin   Study - ORION 4 - inclisiran 300 mg/1.42mL or placebo SQ injection (PI-Stuckey)  300 mg Subcutaneous Q6 months Morris Debby BIRCH, MD        Review of Systems  Constitutional:  Negative for activity change, appetite change, fatigue, fever and unexpected weight change.  HENT:  Negative for congestion, ear pain,  rhinorrhea, sinus pressure and sore throat.   Eyes:  Negative for pain, redness and visual disturbance.  Respiratory:  Negative for cough, shortness of breath and wheezing.   Cardiovascular:  Negative for chest pain and palpitations.  Gastrointestinal:  Negative for abdominal pain, blood in stool, constipation and diarrhea.  Endocrine: Negative for polydipsia and polyuria.  Genitourinary:  Negative for dysuria, frequency and urgency.  Musculoskeletal:  Negative for arthralgias, back pain and myalgias.  Skin:  Negative for pallor and rash.  Allergic/Immunologic: Negative for environmental allergies.  Neurological:  Negative for dizziness, syncope and headaches.  Hematological:  Negative for adenopathy. Does not bruise/bleed easily.       Still in grief from loss of husband 7 mo ago  Psychiatric/Behavioral:  Negative for decreased concentration and dysphoric mood. The patient is not nervous/anxious.        Objective:   Physical Exam Constitutional:      General: She is not in acute distress.    Appearance: Normal  appearance. She is well-developed and normal weight. She is not ill-appearing or diaphoretic.  HENT:     Head: Normocephalic and atraumatic.  Eyes:     Conjunctiva/sclera: Conjunctivae normal.     Pupils: Pupils are equal, round, and reactive to light.  Neck:     Thyroid : No thyromegaly.     Vascular: No carotid bruit or JVD.  Cardiovascular:     Rate and Rhythm: Normal rate and regular rhythm.     Heart sounds: Normal heart sounds.     No gallop.  Pulmonary:     Effort: Pulmonary effort is normal. No respiratory distress.     Breath sounds: Normal breath sounds. No stridor. No wheezing, rhonchi or rales.  Abdominal:     General: There is no distension or abdominal bruit.     Palpations: Abdomen is soft. There is no mass.     Tenderness: There is no abdominal tenderness. There is no guarding or rebound.  Musculoskeletal:     Cervical back: Normal range of motion and  neck supple.     Right lower leg: No edema.     Left lower leg: No edema.  Lymphadenopathy:     Cervical: No cervical adenopathy.  Skin:    General: Skin is warm and dry.     Coloration: Skin is not pale.     Findings: No rash.  Neurological:     Mental Status: She is alert.     Coordination: Coordination normal.     Deep Tendon Reflexes: Reflexes are normal and symmetric. Reflexes normal.  Psychiatric:        Mood and Affect: Mood normal.           Assessment & Plan:   Problem List Items Addressed This Visit       Cardiovascular and Mediastinum   Type 2 diabetes mellitus with diabetic peripheral angiopathy without gangrene, without long-term current use of insulin  (HCC)   Lab Results  Component Value Date   HGBA1C 6.4 11/04/2023   HGBA1C 5.9 (A) 04/27/2023   HGBA1C 6.6 (H) 01/12/2023   Metformin  xr 500 mg daily Glipizide  xl 10 mg - no low glucose issues       Essential hypertension   bp in fair control at this time  BP Readings from Last 1 Encounters:  01/27/24 116/65  This is usually lower at home and also in cardiology office  Most recent labs reviewed as well as cardiology note Disc lifstyle change with low sodium diet and exercise  Plans to continue Amlodipine  2.5 mg daily  imdur  60 mg daily  Informed pt she can take amlodipine  in am and imdur  in pm or vice versa if preferred  Lasix  20 mg every other day Prn propranolol           Endocrine   Hyperlipidemia associated with type 2 diabetes mellitus (HCC)   Disc goals for lipids and reasons to control them Rev last labs with pt Rev low sat fat diet in detail Crestor  5 mg twice weekly (most tolerated)  In setting of cardiac dz   Last LDL 92 Watched by cardiology        Other   Chest pain - Primary   Seen in ER for left chest pain rad to arm and back on 7/21 Reviewed hospital records, lab results and studies in detail   Reassuring work up with normal troponin and keg and cxr  Labs  unremarkable This resolved after 4 h ? Of cause No re occurrence  Normal exam today   Planning follow up with cardiology / has history of CAD  In meantime instructed to call/go to ER if needed if this re occurs  Encouraged healthy habits

## 2024-02-01 ENCOUNTER — Other Ambulatory Visit: Payer: Self-pay | Admitting: Family Medicine

## 2024-02-05 ENCOUNTER — Telehealth: Payer: Self-pay | Admitting: *Deleted

## 2024-02-05 ENCOUNTER — Encounter: Payer: Self-pay | Admitting: *Deleted

## 2024-02-05 DIAGNOSIS — Z006 Encounter for examination for normal comparison and control in clinical research program: Secondary | ICD-10-CM

## 2024-02-05 NOTE — Telephone Encounter (Signed)
 Left message for patient to call back for Orion4 study check in  Suzen Hardy :) RN BSN  Clinical Research Nurse  Be strong and take heart, all you who hope in the [place]. ~ Psalm 31:24

## 2024-02-05 NOTE — Research (Signed)
 [employer] with patient  She is doing fairly well.  Had a bought of chest pains a while back went to ER and patient declined admission and decided to follow up OP with cardiology.  She has an appt in Sept with Dr Gollan.  No change in her meds.  Will call patient back in January 28, for next visit.  It was a pleasure to speak with her over the phone.   Suzen Hardy :) RN BSN  Clinical Research Nurse  Be strong and take heart, all you who hope in the [place]. ~ Psalm 31:24

## 2024-02-10 ENCOUNTER — Other Ambulatory Visit: Payer: Self-pay | Admitting: Family Medicine

## 2024-02-10 MED ORDER — ONETOUCH ULTRA VI STRP: ORAL_STRIP | 1 refills | Status: AC

## 2024-02-10 NOTE — Telephone Encounter (Signed)
 Copied from CRM #[id number]. Topic: Clinical - Medication Refill >> Feb 10, 2024 12:21 PM [place] M wrote: Medication: glucose blood (ONETOUCH ULTRA) test str  Has the patient contacted their pharmacy? Yes (Agent: If no, request that the patient contact the pharmacy for the refill. If patient does not wish to contact the pharmacy document the reason why and proceed with request.) (Agent: If yes, when and what did the pharmacy advise?)  This is the patient's preferred pharmacy:  [id number] [place], [place] - [address] AT [facility] OF [address] [place] [address] Phone: [phone] Fax: [phone]   Is this the correct pharmacy for this prescription? Yes If no, delete pharmacy and type the correct one.   Has the prescription been filled recently? Yes  Is the patient out of the medication? Yes  Has the patient been seen for an appointment in the last year OR does the patient have an upcoming appointment? Yes  Can we respond through MyChart? Yes  Agent: Please be advised that Rx refills may take up to 3 business days. We ask that you follow-up with your pharmacy.

## 2024-02-25 ENCOUNTER — Ambulatory Visit: Admitting: Cardiology

## 2024-03-02 ENCOUNTER — Other Ambulatory Visit: Payer: Self-pay | Admitting: Internal Medicine

## 2024-03-14 NOTE — Progress Notes (Unsigned)
 Cardiology Office Note  Date:  03/15/2024   ID:  Margaret Vazquez, DOB 05-15-1936, MRN [id number]  PCP:  Margaret Laine LABOR, MD   Chief Complaint  Patient presents with   New Patient (Initial Visit)    Patient was at [facility] on 01/25/2024 for chest pain. Patient c/o shortness of breath, tiredness, chest pain and patient stated her apple watch showed A-Fib 20 % of the time.     HPI:  Margaret Vazquez a 88 y.o. femalewith past medical history of: Cryptogenic stroke Hypertension 2019 coronary artery disease-CT>> circumflex was stented   Atrial fibrillation, on Eliquis   chronotropic incompetence and exercise intolerance.  Implantable loop monitor Who presents by referral from Dr. Kreg Moats for consultation of her chest pain  Prior cardiac imaging reviewed Echo January 2024 Ejection fraction 60%  Myoview  February 2023 Low risk study, normal EF, no ischemia  Cardiac catheterization June 2019 80% circumflex disease, stent placed  Seen in the ER July 2025 chest pain Pain started while waiting in line at the bank midsternal radiating down her arm Pain different from prior anginal pain Workup negative  On further discussion, she reports no regular exercise program Feels tired, SOB on exertion, rare chest tightness Can't walk much, knee pain Seen by PT, injured her leg  EKG personally reviewed by myself on todays visit EKG Interpretation Date/Time:  Tuesday March 15 2024 11:07:54 EDT Ventricular Rate:  73 PR Interval:  166 QRS Duration:  76 QT Interval:  378 QTC Calculation: 416 R Axis:   28  Text Interpretation: Sinus rhythm with Premature atrial complexes When compared with ECG of 15-Mar-2024 11:07, Premature atrial complexes are now Present Confirmed by Perla Lye [phone]) on 03/15/2024 11:24:30 AM      PMH:   has a past medical history of Allergy history, drug, Basal cell carcinoma, CAD (coronary artery disease), Cervical stenosis of spine, CKD (chronic kidney disease),  stage II, Colon polyps (2009), Complication of anesthesia (1980s), GERD (gastroesophageal reflux disease), Hyperlipidemia, Hypertension, Migraine, Squamous carcinoma, Stroke (HCC), TIA (transient ischemic attack), and Type II diabetes mellitus (HCC).  PSH:    Past Surgical History:  Procedure Laterality Date   ABD US   07/2003   Negative   APPENDECTOMY     BASAL CELL CARCINOMA EXCISION     back   BREAST BIOPSY Left 1992   benign   CARDIAC CATHETERIZATION  04/07/2011   non obst CAD (Dr Wonda)   CATARACT EXTRACTION W/PHACO Left 04/01/2017   Procedure: CATARACT EXTRACTION PHACO AND INTRAOCULAR LENS PLACEMENT (IOC) LEFT DIABETIC;  Surgeon: Mittie Gaskin, MD;  Location: [facility];  Service: Ophthalmology;  Laterality: Left;  Diabetic - oral meds   CATARACT EXTRACTION W/PHACO Right 04/29/2017   Procedure: CATARACT EXTRACTION PHACO AND INTRAOCULAR LENS PLACEMENT (IOC) RIGHT DIABETIC;  Surgeon: Mittie Gaskin, MD;  Location: [facility];  Service: Ophthalmology;  Laterality: Right;  Diabetic - oral meds   COLONOSCOPY  12/2007   Adenomatous colon polyps   CORONARY ANGIOPLASTY WITH STENT PLACEMENT  12/24/2017   CORONARY STENT INTERVENTION N/A 12/24/2017   Procedure: CORONARY STENT INTERVENTION;  Surgeon: Court Dorn PARAS, MD;  Location: [facility];  Service: Cardiovascular;  Laterality: N/A;   CYSTOSCOPY W/ DECANNULATION  03/2000   Normal   LEFT HEART CATH AND CORONARY ANGIOGRAPHY N/A 12/24/2017   Procedure: LEFT HEART CATH AND CORONARY ANGIOGRAPHY;  Surgeon: Court Dorn PARAS, MD;  Location: [facility];  Service: Cardiovascular;  Laterality: N/A;   LOOP RECORDER  IMPLANT N/A 02/16/2013   Procedure: LOOP RECORDER IMPLANT;  Surgeon: Elspeth JAYSON Sage, MD;  Location: [facility];  Service: Cardiovascular;  Laterality: N/A;   MOHS SURGERY     right hand    NASAL SINUS SURGERY  01/2005   skin cancer removed  2024   arm and leg   SQUAMOUS CELL  CARCINOMA EXCISION     mostly arms; (12/24/2017)   STRESS CARDIOLITE   11/1999   Normal/ negative   TEAR DUCT PROBING  2005   ? side   TEE WITHOUT CARDIOVERSION N/A 02/16/2013   Procedure: TRANSESOPHAGEAL ECHOCARDIOGRAM (TEE);  Surgeon: Maude JAYSON Emmer, MD;  Location: [facility];  Service: Cardiovascular;  Laterality: N/A;   TUBAL LIGATION     BTL    Current Outpatient Medications  Medication Sig Dispense Refill   amLODipine  (NORVASC ) 2.5 MG tablet TAKE 1 TABLET(2.5 MG) BY MOUTH EVERY DAY 90 tablet 1   blood glucose meter kit and supplies Dispense based on patient and insurance preference. Use up to four times daily as directed. (FOR ICD-10 E10.9, E11.9). 1 each 0   Blood Glucose Monitoring Suppl (ONE TOUCH ULTRA 2) w/Device KIT Use to check blood sugar once daily 1 each 0   Cyanocobalamin  (VITAMIN B-12 PO) Take 1 tablet by mouth daily.     diphenhydrAMINE  (BENADRYL ) 25 MG tablet Take 50 mg by mouth every 8 (eight) hours as needed for allergies.      ELIQUIS  5 MG TABS tablet TAKE 1 TABLET(5 MG) BY MOUTH TWICE DAILY 60 tablet 5   EPINEPHrine  (EPI-PEN) 0.3 mg/0.3 mL DEVI Inject 0.3 mg into the muscle daily as needed (allergic reaction).     fluticasone (FLONASE) 50 MCG/ACT nasal spray Place 2 sprays into both nostrils 2 (two) times daily as needed. Reported on 09/10/2015     furosemide  (LASIX ) 20 MG tablet Take 1 tablet (20 mg total) by mouth every other day. 90 tablet 3   glipiZIDE  (GLUCOTROL  XL) 10 MG 24 hr tablet TAKE 1 TABLET(10 MG) BY MOUTH DAILY WITH BREAKFAST 90 tablet 0   glucose blood (ONETOUCH ULTRA) test strip USE TO CHECK BLOOD SUGAR ONCE DAILY AS DIRECTED 100 strip 1   isosorbide  mononitrate (IMDUR ) 60 MG 24 hr tablet TAKE 1 TABLET(60 MG) BY MOUTH DAILY 90 tablet 1   metFORMIN  (GLUCOPHAGE -XR) 500 MG 24 hr tablet TAKE 1 TABLET(500 MG) BY MOUTH DAILY WITH BREAKFAST 90 tablet 0   nitroGLYCERIN  (NITROSTAT ) 0.4 MG SL tablet Place 1 tablet (0.4 mg total) under the tongue every 5  (five) minutes as needed for chest pain (up to 3 doses. If taking 3rd dose call 911). 15 tablet 1   propranolol  (INDERAL ) 10 MG tablet Take 1 tablet (10 mg) by mouth every 1 hour x 3 doses as needed for chest pain/ fast heart rates 30 tablet 1   terconazole  (TERAZOL 3 ) 0.8 % vaginal cream Use externally daily as needed for yeast infection symptoms 20 g 0   Current Facility-Administered Medications  Medication Dose Route Frequency Provider Last Rate Last Admin   Study - ORION 4 - inclisiran 300 mg/1.72mL or placebo SQ injection (PI-Stuckey)  300 mg Subcutaneous Q6 months Morris Debby BIRCH, MD         Allergies:   Bee venom, Nabumetone, Amoxicillin-pot clavulanate, Aspirin, Atorvastatin, Clopidogrel bisulfate, Codeine, Ezetimibe, Metformin  and related, Valsartan, Crestor  [rosuvastatin ], Farxiga  [dapagliflozin ], Jardiance  [empagliflozin ], and Other   Social History:  The patient  reports that she has never smoked. She has never used smokeless  tobacco. She reports that she does not drink alcohol and does not use drugs.   Family History:   family history includes Brain cancer in an other family member; Breast cancer in her sister; Diabetes in her brother and sister; Heart disease in her father and mother; Lung cancer in her brother; Pancreatic cancer in her brother; Skin cancer in her daughter.    Review of Systems: Review of Systems  Constitutional: Negative.   HENT: Negative.    Respiratory: Negative.    Cardiovascular: Negative.   Gastrointestinal: Negative.   Musculoskeletal: Negative.   Neurological: Negative.   Psychiatric/Behavioral: Negative.    All other systems reviewed and are negative.   PHYSICAL EXAM: VS:  BP 130/70 (BP Location: Right Arm, Patient Position: Sitting, Cuff Size: Normal)   Pulse 68   Ht 5' 8 (1.727 m)   Wt 187 lb 2 oz (84.9 kg)   SpO2 95%   BMI 28.45 kg/m  , BMI Body mass index is 28.45 kg/m. GEN: Well nourished, well developed, in no acute  distress HEENT: normal Neck: no JVD, carotid bruits, or masses Cardiac: RRR; no murmurs, rubs, or gallops,no edema  Respiratory:  clear to auscultation bilaterally, normal work of breathing GI: soft, nontender, nondistended, + BS MS: no deformity or atrophy Skin: warm and dry, no rash Neuro:  Strength and sensation are intact Psych: euthymic mood, full affect  Recent Labs: 11/04/2023: TSH 2.75 01/25/2024: ALT 12; B Natriuretic Peptide 248.6; BUN 18; Creatinine, Ser 1.02; Hemoglobin 13.9; Platelets 215; Potassium 4.2; Sodium 139    Lipid Panel Lab Results  Component Value Date   CHOL 168 11/04/2023   HDL 42.00 11/04/2023   LDLCALC 92 11/04/2023   TRIG 172.0 (H) 11/04/2023    Wt Readings from Last 3 Encounters:  03/15/24 187 lb 2 oz (84.9 kg)  01/27/24 186 lb 2 oz (84.4 kg)  01/25/24 185 lb (83.9 kg)       ASSESSMENT AND PLAN:  Problem List Items Addressed This Visit       Cardiology Problems   Essential hypertension   Relevant Orders   EKG 12-Lead (Completed)   Type 2 diabetes mellitus with diabetic peripheral angiopathy without gangrene, without long-term current use of insulin  (HCC)   PAF (paroxysmal atrial fibrillation) (HCC) - Primary   Relevant Orders   EKG 12-Lead (Completed)   Coronary artery disease   Relevant Orders   EKG 12-Lead (Completed)   Other Visit Diagnoses       Hyperlipidemia LDL goal <70         Cryptogenic stroke (HCC)       Relevant Orders   EKG 12-Lead (Completed)     SOB (shortness of breath)          CAD, chest pain Episode while at the bank July 2025, no further episodes since then Recommend she take nitro for pain, new prescription provided - She does have prior history of stable angina Recommend if symptoms get worse and she is taking sublingual nitro that she call our office, may need to repeat Myoview  - Continue isosorbide  and low-dose amlodipine   Paroxysmal atrial fibrillation She reports that her watch indicates 20%  A-fib burden Relatively asymptomatic, on Eliquis  Zio monitor ordered to determine A-fib burden Watch may be picking up PACs noted on EKG today  Essential hypertension Blood pressure is well controlled on today's visit. No changes made to the medications.  Hyperlipidemia Intolerant of numerous medications, previously seen by lipid clinic, did not tolerate  shots  Signed, Velinda Lunger, M.D., Ph.D. [facility] [place], [place] [phone]

## 2024-03-15 ENCOUNTER — Ambulatory Visit (INDEPENDENT_AMBULATORY_CARE_PROVIDER_SITE_OTHER)

## 2024-03-15 ENCOUNTER — Ambulatory Visit: Attending: Cardiovascular Disease | Admitting: Cardiovascular Disease

## 2024-03-15 ENCOUNTER — Encounter: Payer: Self-pay | Admitting: Cardiovascular Disease

## 2024-03-15 VITALS — BP 130/70 | HR 68 | Ht 68.0 in | Wt 187.1 lb

## 2024-03-15 DIAGNOSIS — R0602 Shortness of breath: Secondary | ICD-10-CM

## 2024-03-15 DIAGNOSIS — I639 Cerebral infarction, unspecified: Secondary | ICD-10-CM

## 2024-03-15 DIAGNOSIS — I480 Paroxysmal atrial fibrillation: Secondary | ICD-10-CM

## 2024-03-15 DIAGNOSIS — E1151 Type 2 diabetes mellitus with diabetic peripheral angiopathy without gangrene: Secondary | ICD-10-CM | POA: Diagnosis not present

## 2024-03-15 DIAGNOSIS — I10 Essential (primary) hypertension: Secondary | ICD-10-CM

## 2024-03-15 DIAGNOSIS — I25118 Atherosclerotic heart disease of native coronary artery with other forms of angina pectoris: Secondary | ICD-10-CM

## 2024-03-15 DIAGNOSIS — E785 Hyperlipidemia, unspecified: Secondary | ICD-10-CM

## 2024-03-15 DIAGNOSIS — I491 Atrial premature depolarization: Secondary | ICD-10-CM | POA: Diagnosis not present

## 2024-03-15 MED ORDER — NITROGLYCERIN 0.4 MG SL SUBL: 0.4000 mg | SUBLINGUAL_TABLET | SUBLINGUAL | 1 refills | Status: AC | PRN

## 2024-03-15 NOTE — Patient Instructions (Addendum)
 Medication Instructions:  No changes  If you need a refill on your cardiac medications before your next appointment, please call your pharmacy.   Lab work: No new labs needed  Testing/Procedures:  Your physician has recommended that you wear a Zio monitor.   This monitor is a medical device that records the heart's electrical activity. Doctors most often use these monitors to diagnose arrhythmias. Arrhythmias are problems with the speed or rhythm of the heartbeat. The monitor is a small device applied to your chest. You can wear one while you do your normal daily activities. While wearing this monitor if you have any symptoms to push the button and record what you felt. Once you have worn this monitor for the period of time provider prescribed (Usually 14 days), you will return the monitor device in the postage paid box. Once it is returned they will download the data collected and provide us  with a report which the provider will then review and we will call you with those results. Important tips:  Avoid showering during the first 24 hours of wearing the monitor. Avoid excessive sweating to help maximize wear time. Do not submerge the device, no hot tubs, and no swimming pools. Keep any lotions or oils away from the patch. After 24 hours you may shower with the patch on. Take brief showers with your back facing the shower head.  Do not remove patch once it has been placed because that will interrupt data and decrease adhesive wear time. Push the button when you have any symptoms and write down what you were feeling. Once you have completed wearing your monitor, remove and place into box which has postage paid and place in your outgoing mailbox.  If for some reason you have misplaced your box then call our office and we can provide another box and/or mail it off for you.   Follow-Up: At [facility], you and your health needs are our priority.  As part of our continuing mission to provide  you with exceptional heart care, we have created designated Provider Care Teams.  These Care Teams include your primary Cardiologist (physician) and Advanced Practice Providers (APPs -  Physician Assistants and Nurse Practitioners) who all work together to provide you with the care you need, when you need it.  You will need a follow up appointment in 6 months, APP ok  Providers on your designated Care Team:   Margaret Meager, NP Margaret Bring, PA-C Margaret Vazquez, [place]  COVID-19 Vaccine Information can be found at: [email or web address] For questions related to vaccine distribution or appointments, please email [email or web address] or call [phone].

## 2024-03-29 ENCOUNTER — Other Ambulatory Visit: Payer: Self-pay | Admitting: Internal Medicine

## 2024-03-29 DIAGNOSIS — I480 Paroxysmal atrial fibrillation: Secondary | ICD-10-CM

## 2024-03-29 NOTE — Telephone Encounter (Signed)
 Prescription refill request for Eliquis  received. Indication:afib Last office visit:9/25 Scr:1.02  7/25 Age: 88 Weight:84.9  kg  Prescription refilled

## 2024-04-04 ENCOUNTER — Ambulatory Visit: Payer: PPO

## 2024-04-04 VITALS — BP 130/70 | Ht 68.0 in | Wt 180.0 lb

## 2024-04-04 DIAGNOSIS — Z0000 Encounter for general adult medical examination without abnormal findings: Secondary | ICD-10-CM

## 2024-04-04 NOTE — Progress Notes (Signed)
 Because this visit was a virtual/telehealth visit,  certain criteria was not obtained, such a blood pressure, CBG if applicable, and timed get up and go. Any medications not marked as taking were not mentioned during the medication reconciliation part of the visit. Any vitals not documented were not able to be obtained due to this being a telehealth visit or patient was unable to self-report a recent blood pressure reading due to a lack of equipment at home via telehealth. Vitals that have been documented are verbally provided by the patient.  This visit was performed by a medical professional under my direct supervision. I was immediately available for consultation/collaboration. I have reviewed and agree with the Annual Wellness Visit documentation.  Subjective:   Margaret Vazquez is a 88 y.o. who presents for a Medicare Wellness preventive visit.  As a reminder, Annual Wellness Visits don't include a physical exam, and some assessments may be limited, especially if this visit is performed virtually. We may recommend an in-person follow-up visit with your provider if needed.  Visit Complete: Virtual I connected with  Margaret Vazquez on 04/04/24 by a audio enabled telemedicine application and verified that I am speaking with the correct person using two identifiers.  Patient Location: Home  Provider Location: Home Office  I discussed the limitations of evaluation and management by telemedicine. The patient expressed understanding and agreed to proceed.  Vital Signs: Because this visit was a virtual/telehealth visit, some criteria may be missing or patient reported. Any vitals not documented were not able to be obtained and vitals that have been documented are patient reported.  VideoDeclined- This patient declined Librarian, academic. Therefore the visit was completed with audio only.  Persons Participating in Visit: Patient.  AWV Questionnaire: No: Patient Medicare  AWV questionnaire was not completed prior to this visit.  Cardiac Risk Factors include: advanced age (>9men, >17 women);diabetes mellitus;hypertension;dyslipidemia     Objective:    Today's Vitals   04/04/24 1442  BP: 130/70  Weight: 180 lb (81.6 kg)  Height: 5' 8 (1.727 m)   Body mass index is 27.37 kg/m.     04/04/2024    2:42 PM 01/25/2024    4:19 PM 03/30/2023   10:57 AM 12/07/2022   11:31 AM 04/20/2022    1:42 AM 04/01/2021    2:07 PM 09/27/2019    4:53 PM  Advanced Directives  Does Patient Have a Medical Advance Directive? Yes Yes Yes No No No No  Type of Estate agent of [place];Living will Healthcare Power of [place];Living will Healthcare Power of [place];Living will      Does patient want to make changes to medical advance directive? No - Patient declined  No - Patient declined      Copy of Healthcare Power of Attorney in Chart? Yes - validated most recent copy scanned in chart (See row information)  Yes - validated most recent copy scanned in chart (See row information)      Would patient like information on creating a medical advance directive?      No - Patient declined No - Patient declined    Current Medications (verified) Outpatient Encounter Medications as of 04/04/2024  Medication Sig   amLODipine  (NORVASC ) 2.5 MG tablet TAKE 1 TABLET(2.5 MG) BY MOUTH EVERY DAY   blood glucose meter kit and supplies Dispense based on patient and insurance preference. Use up to four times daily as directed. (FOR ICD-10 E10.9, E11.9).   Blood Glucose Monitoring Suppl (ONE TOUCH  ULTRA 2) w/Device KIT Use to check blood sugar once daily   Cyanocobalamin  (VITAMIN B-12 PO) Take 1 tablet by mouth daily.   diphenhydrAMINE  (BENADRYL ) 25 MG tablet Take 50 mg by mouth every 8 (eight) hours as needed for allergies.    ELIQUIS  5 MG TABS tablet TAKE 1 TABLET(5 MG) BY MOUTH TWICE DAILY   EPINEPHrine  (EPI-PEN) 0.3 mg/0.3 mL DEVI Inject 0.3 mg into the muscle daily as  needed (allergic reaction).   fluticasone (FLONASE) 50 MCG/ACT nasal spray Place 2 sprays into both nostrils 2 (two) times daily as needed. Reported on 09/10/2015   furosemide  (LASIX ) 20 MG tablet Take 1 tablet (20 mg total) by mouth every other day.   glipiZIDE  (GLUCOTROL  XL) 10 MG 24 hr tablet TAKE 1 TABLET(10 MG) BY MOUTH DAILY WITH BREAKFAST   glucose blood (ONETOUCH ULTRA) test strip USE TO CHECK BLOOD SUGAR ONCE DAILY AS DIRECTED   isosorbide  mononitrate (IMDUR ) 60 MG 24 hr tablet TAKE 1 TABLET(60 MG) BY MOUTH DAILY   metFORMIN  (GLUCOPHAGE -XR) 500 MG 24 hr tablet TAKE 1 TABLET(500 MG) BY MOUTH DAILY WITH BREAKFAST   nitroGLYCERIN  (NITROSTAT ) 0.4 MG SL tablet Place 1 tablet (0.4 mg total) under the tongue every 5 (five) minutes as needed for chest pain (up to 3 doses. If taking 3rd dose call 911).   propranolol  (INDERAL ) 10 MG tablet Take 1 tablet (10 mg) by mouth every 1 hour x 3 doses as needed for chest pain/ fast heart rates   terconazole  (TERAZOL 3 ) 0.8 % vaginal cream Use externally daily as needed for yeast infection symptoms   Facility-Administered Encounter Medications as of 04/04/2024  Medication   Study - ORION 4 - inclisiran 300 mg/1.11mL or placebo SQ injection (PI-Stuckey)    Allergies (verified) Bee venom, Nabumetone, Amoxicillin-pot clavulanate, Aspirin, Atorvastatin, Clopidogrel bisulfate, Codeine, Ezetimibe, Metformin  and related, Valsartan, Crestor  [rosuvastatin ], Farxiga  [dapagliflozin ], Jardiance  [empagliflozin ], and Other   History: Past Medical History:  Diagnosis Date   Allergy history, drug    Aspirin   Basal cell carcinoma    back   CAD (coronary artery disease)    a. Previously nonobstructive then progressive angina with abnl CT -> cardiac cath 12/24/17 showed 30% mid RCA and 80% prox-mid Cx. She received DES to mid AV groove Cx. EF 55-65%.    Cervical stenosis of spine    With neck pain   CKD (chronic kidney disease), stage II    Colon polyps 2009    Complication of anesthesia 1980s   slow to wake after anesthesia when I had breast biopsy   GERD (gastroesophageal reflux disease)    Hyperlipidemia    myalgias with Lipitor and Zetia   Hypertension    Migraine    stopped at age 37 (12/24/2017)   Squamous carcinoma    iced off and cut off; mostly arms (12/24/2017)   Stroke [facility])    told me I'd had 2 strokes in 02/2013; denies residual on 12/24/2017   TIA (transient ischemic attack)    Type II diabetes mellitus (HCC)    Past Surgical History:  Procedure Laterality Date   ABD US   07/2003   Negative   APPENDECTOMY     BASAL CELL CARCINOMA EXCISION     back   BREAST BIOPSY Left 1992   benign   CARDIAC CATHETERIZATION  04/07/2011   non obst CAD (Dr Wonda)   CATARACT EXTRACTION W/PHACO Left 04/01/2017   Procedure: CATARACT EXTRACTION PHACO AND INTRAOCULAR LENS PLACEMENT (IOC) LEFT DIABETIC;  Surgeon: Mittie,  Dene, MD;  Location: [facility];  Service: Ophthalmology;  Laterality: Left;  Diabetic - oral meds   CATARACT EXTRACTION W/PHACO Right 04/29/2017   Procedure: CATARACT EXTRACTION PHACO AND INTRAOCULAR LENS PLACEMENT (IOC) RIGHT DIABETIC;  Surgeon: Mittie Dene, MD;  Location: [facility];  Service: Ophthalmology;  Laterality: Right;  Diabetic - oral meds   COLONOSCOPY  12/2007   Adenomatous colon polyps   CORONARY ANGIOPLASTY WITH STENT PLACEMENT  12/24/2017   CORONARY STENT INTERVENTION N/A 12/24/2017   Procedure: CORONARY STENT INTERVENTION;  Surgeon: Court Dorn PARAS, MD;  Location: [facility] INVASIVE CV LAB;  Service: Cardiovascular;  Laterality: N/A;   CYSTOSCOPY W/ DECANNULATION  03/2000   Normal   LEFT HEART CATH AND CORONARY ANGIOGRAPHY N/A 12/24/2017   Procedure: LEFT HEART CATH AND CORONARY ANGIOGRAPHY;  Surgeon: Court Dorn PARAS, MD;  Location: [facility] INVASIVE CV LAB;  Service: Cardiovascular;  Laterality: N/A;   LOOP RECORDER IMPLANT N/A 02/16/2013   Procedure: LOOP RECORDER IMPLANT;   Surgeon: Elspeth JAYSON Sage, MD;  Location: [facility] CATH LAB;  Service: Cardiovascular;  Laterality: N/A;   MOHS SURGERY     right hand    NASAL SINUS SURGERY  01/2005   skin cancer removed  2024   arm and leg   SQUAMOUS CELL CARCINOMA EXCISION     mostly arms; (12/24/2017)   STRESS CARDIOLITE   11/1999   Normal/ negative   TEAR DUCT PROBING  2005   ? side   TEE WITHOUT CARDIOVERSION N/A 02/16/2013   Procedure: TRANSESOPHAGEAL ECHOCARDIOGRAM (TEE);  Surgeon: Maude JAYSON Emmer, MD;  Location: [facility];  Service: Cardiovascular;  Laterality: N/A;   TUBAL LIGATION     BTL   Family History  Problem Relation Age of Onset   Lung cancer Brother    Diabetes Brother    Pancreatic cancer Brother    Heart disease Mother    Heart disease Father    Brain cancer Other    Skin cancer Daughter    Diabetes Sister    Breast cancer Sister    Colon cancer Neg Hx    Social History   Socioeconomic History   Marital status: Married    Spouse name: Deward   Number of children: 3   Years of education: 12   Highest education level: Not on file  Occupational History   Occupation: Retired    Associate Professor: RETIRED  Tobacco Use   Smoking status: Never   Smokeless tobacco: Never  Vaping Use   Vaping status: Never Used  Substance and Sexual Activity   Alcohol use: Never    Alcohol/week: 0.0 standard drinks of alcohol   Drug use: Never   Sexual activity: Not Currently  Other Topics Concern   Not on file  Social History Narrative   3 children   Does not drink caffeinated beverages   Cares for SIL with dementia   Social Drivers of Health   Financial Resource Strain: Low Risk  (04/04/2024)   Overall Financial Resource Strain (CARDIA)    Difficulty of Paying Living Expenses: Not hard at all  Food Insecurity: No Food Insecurity (04/04/2024)   Hunger Vital Sign    Worried About Running Out of Food in the Last Year: Never true    Ran Out of Food in the Last Year: Never true  Transportation Needs: No  Transportation Needs (04/04/2024)   PRAPARE - Administrator, Civil Service (Medical): No    Lack of Transportation (Non-Medical): No  Physical Activity: Patient Declined (  04/04/2024)   Exercise Vital Sign    Days of Exercise per Week: Patient declined    Minutes of Exercise per Session: Patient declined  Stress: No Stress Concern Present (04/04/2024)   [employer] of Occupational Health - Occupational Stress Questionnaire    Feeling of Stress: Not at all  Social Connections: Moderately Isolated (04/04/2024)   Social Connection and Isolation Panel    Frequency of Communication with Friends and Family: More than three times a week    Frequency of Social Gatherings with Friends and Family: Twice a week    Attends Religious Services: More than 4 times per year    Active Member of [employer] or Organizations: No    Attends Banker Meetings: Never    Marital Status: Widowed    Tobacco Counseling Counseling given: Not Answered    Clinical Intake:  Pre-visit preparation completed: Yes  Pain : No/denies pain     BMI - recorded: 27.37 Nutritional Status: BMI 25 -29 Overweight Nutritional Risks: None Diabetes: Yes CBG done?: No Did pt. bring in CBG monitor from home?: No  Lab Results  Component Value Date   HGBA1C 6.4 11/04/2023   HGBA1C 5.9 (A) 04/27/2023   HGBA1C 6.6 (H) 01/12/2023     How often do you need to have someone help you when you read instructions, pamphlets, or other written materials from your doctor or pharmacy?: 1 - Never  Interpreter Needed?: No  Information entered by :: Briggitte Boline,CMA   Activities of Daily Living     04/04/2024    2:44 PM  In your present state of health, do you have any difficulty performing the following activities:  Hearing? 0  Vision? 0  Difficulty concentrating or making decisions? 0  Walking or climbing stairs? 0  Dressing or bathing? 0  Doing errands, shopping? 0  Preparing Food and eating ?  N  Using the Toilet? N  In the past six months, have you accidently leaked urine? Y  Do you have problems with loss of bowel control? Y  Managing your Medications? N  Managing your Finances? N  Housekeeping or managing your Housekeeping? N    Patient Care Team: Tower, Laine LABOR, MD as PCP - General (Family Medicine) Fernande Elspeth BROCKS, MD as PCP - Cardiology (Cardiology) Fernande Elspeth BROCKS, MD as PCP - Electrophysiology (Cardiology) Dasher, Alm LABOR, MD as Consulting Physician (Dermatology) Lynnea Garrel BROCKS., MD as Referring Physician (Dentistry) Mittie Gaskin, MD as Referring Physician (Ophthalmology)  I have updated your Care Teams any recent Medical Services you may have received from other providers in the past year.     Assessment:   This is a routine wellness examination for Mckenlee.  Hearing/Vision screen Hearing Screening - Comments:: Patient has hearing aids  Vision Screening - Comments:: Patient wears glasses    Goals Addressed             This Visit's Progress    Patient Stated       To be healthy       Depression Screen     04/04/2024    2:46 PM 03/30/2023   10:51 AM 11/07/2021    9:06 AM 04/01/2021    2:15 PM 11/05/2020    2:28 PM 10/20/2019   10:11 AM 09/21/2019   10:13 AM  PHQ 2/9 Scores  PHQ - 2 Score 2 0 0 1 0 0 0  PHQ- 9 Score 2 3         Fall Risk  04/04/2024    2:44 PM 03/30/2023   10:44 AM 11/07/2021    9:06 AM 08/27/2021   10:50 AM 04/01/2021    2:14 PM  Fall Risk   Falls in the past year? 1 0 0 0 1  Number falls in past yr: 0 1  0 1  Injury with Fall? 0 0  0 0  Risk for fall due to : History of fall(s) No Fall Risks   History of fall(s)  Follow up Falls evaluation completed;Education provided;Falls prevention discussed Falls prevention discussed;Falls evaluation completed Falls evaluation completed   Falls prevention discussed;Education provided      Data saved with a previous flowsheet row definition    MEDICARE RISK AT HOME:   Medicare Risk at Home Any stairs in or around the home?: Yes If so, are there any without handrails?: No Home free of loose throw rugs in walkways, pet beds, electrical cords, etc?: Yes Adequate lighting in your home to reduce risk of falls?: Yes Life alert?: Yes Use of a cane, walker or w/c?: No Grab bars in the bathroom?: Yes Shower chair or bench in shower?: No Elevated toilet seat or a handicapped toilet?: No  TIMED UP AND GO:  Was the test performed?  No  Cognitive Function: 6CIT completed    09/01/2017    9:20 AM 08/27/2016    9:23 AM  MMSE - Mini Mental State Exam  Orientation to time 5 5   Orientation to Place 5 5   Registration 3 3   Attention/ Calculation 0 0   Recall 3 3   Language- name 2 objects 0 0   Language- repeat 1 1  Language- follow 3 step command 3 3   Language- read & follow direction 0 0   Write a sentence 0 0   Copy design 0 0   Total score 20 20      Data saved with a previous flowsheet row definition        04/04/2024    2:47 PM 03/30/2023   10:57 AM  6CIT Screen  What Year? 0 points 0 points  What month? 0 points 0 points  What time? 0 points 0 points  Count back from 20 0 points 2 points  Months in reverse 0 points 2 points  Repeat phrase 0 points 2 points  Total Score 0 points 6 points    Immunizations Immunization History  Administered Date(s) Administered   Fluad Quad(high Dose 65+) 03/23/2019, 03/26/2020, 05/08/2021, 07/01/2022   Fluad Trivalent(High Dose 65+) 04/27/2023   Influenza Split 05/05/2011, 04/28/2012   Influenza Whole 04/24/2005, 06/03/2010   Influenza,inj,Quad PF,6+ Mos 05/16/2013, 09/13/2018   Influenza-Unspecified 04/01/2014   PFIZER(Purple Top)SARS-COV-2 Vaccination 09/07/2019, 09/19/2019, 09/28/2019, 10/10/2019, 06/08/2020   Pneumococcal Conjugate-13 08/22/2015   Pneumococcal Polysaccharide-23 12/03/2010   Td 11/27/1997, 10/28/2011   Tdap 05/08/2023    Screening Tests Health Maintenance  Topic Date Due    Mammogram  06/18/2022   FOOT EXAM  10/13/2023   Influenza Vaccine  02/05/2024   COVID-19 Vaccine (6 - 2025-26 season) 03/07/2024   Zoster Vaccines- Shingrix (1 of 2) 04/20/2028 (Originally 10/01/1954)   HEMOGLOBIN A1C  05/05/2024   OPHTHALMOLOGY EXAM  10/11/2024   Medicare Annual Wellness (AWV)  04/04/2025   DTaP/Tdap/Td (4 - Td or Tdap) 05/07/2033   Pneumococcal Vaccine: 50+ Years  Completed   DEXA SCAN  Completed   HPV VACCINES  Aged Out   Meningococcal B Vaccine  Aged Out    Health Maintenance Items Addressed:patient  declined  Additional Screening:  Vision Screening: Recommended annual ophthalmology exams for early detection of glaucoma and other disorders of the eye. Is the patient up to date with their annual eye exam?  No  Who is the provider or what is the name of the office in which the patient attends annual eye exams?   Dental Screening: Recommended annual dental exams for proper oral hygiene  Community Resource Referral / Chronic Care Management: CRR required this visit?  No   CCM required this visit?  No   Plan:    I have personally reviewed and noted the following in the patient's chart:   Medical and social history Use of alcohol, tobacco or illicit drugs  Current medications and supplements including opioid prescriptions. Patient is not currently taking opioid prescriptions. Functional ability and status Nutritional status Physical activity Advanced directives List of other physicians Hospitalizations, surgeries, and ER visits in previous 12 months Vitals Screenings to include cognitive, depression, and falls Referrals and appointments  In addition, I have reviewed and discussed with patient certain preventive protocols, quality metrics, and best practice recommendations. A written personalized care plan for preventive services as well as general preventive health recommendations were provided to patient.   Lyle MARLA Right, [place]   04/04/2024    After Visit Summary: (MyChart) Due to this being a telephonic visit, the after visit summary with patients personalized plan was offered to patient via MyChart   Notes: Nothing significant to report at this time.

## 2024-04-04 NOTE — Patient Instructions (Signed)
 Margaret Vazquez,  Thank you for taking the time for your Medicare Wellness Visit. I appreciate your continued commitment to your health goals. Please review the care plan we discussed, and feel free to reach out if I can assist you further.  Medicare recommends these wellness visits once per year to help you and your care team stay ahead of potential health issues. These visits are designed to focus on prevention, allowing your provider to concentrate on managing your acute and chronic conditions during your regular appointments.  Please note that Annual Wellness Visits do not include a physical exam. Some assessments may be limited, especially if the visit was conducted virtually. If needed, we may recommend a separate in-person follow-up with your provider.  Ongoing Care Seeing your primary care provider every 3 to 6 months helps us  monitor your health and provide consistent, personalized care.   Referrals If a referral was made during today's visit and you haven't received any updates within two weeks, please contact the referred provider directly to check on the status.  Recommended Screenings:  Health Maintenance  Topic Date Due   Breast Cancer Screening  06/18/2022   Complete foot exam   10/13/2023   Flu Shot  02/05/2024   COVID-19 Vaccine (6 - 2025-26 season) 03/07/2024   Zoster (Shingles) Vaccine (1 of 2) 04/20/2028*   Hemoglobin A1C  05/05/2024   Eye exam for diabetics  10/11/2024   Medicare Annual Wellness Visit  04/04/2025   DTaP/Tdap/Td vaccine (4 - Td or Tdap) 05/07/2033   Pneumococcal Vaccine for age over 64  Completed   DEXA scan (bone density measurement)  Completed   HPV Vaccine  Aged Out   Meningitis B Vaccine  Aged Out  *Topic was postponed. The date shown is not the original due date.       04/04/2024    2:42 PM  Advanced Directives  Does Patient Have a Medical Advance Directive? Yes  Type of Estate agent of [place];Living will  Does  patient want to make changes to medical advance directive? No - Patient declined  Copy of Healthcare Power of Attorney in Chart? Yes - validated most recent copy scanned in chart (See row information)   Advance Care Planning is important because it: Ensures you receive medical care that aligns with your values, goals, and preferences. Provides guidance to your family and loved ones, reducing the emotional burden of decision-making during critical moments.  Vision: Annual vision screenings are recommended for early detection of glaucoma, cataracts, and diabetic retinopathy. These exams can also reveal signs of chronic conditions such as diabetes and high blood pressure.  Dental: Annual dental screenings help detect early signs of oral cancer, gum disease, and other conditions linked to overall health, including heart disease and diabetes.  Please see the attached documents for additional preventive care recommendations.

## 2024-04-08 DIAGNOSIS — I480 Paroxysmal atrial fibrillation: Secondary | ICD-10-CM | POA: Diagnosis not present

## 2024-04-11 DIAGNOSIS — H353132 Nonexudative age-related macular degeneration, bilateral, intermediate dry stage: Secondary | ICD-10-CM | POA: Diagnosis not present

## 2024-04-11 DIAGNOSIS — Z961 Presence of intraocular lens: Secondary | ICD-10-CM | POA: Diagnosis not present

## 2024-04-11 DIAGNOSIS — H43813 Vitreous degeneration, bilateral: Secondary | ICD-10-CM | POA: Diagnosis not present

## 2024-04-11 DIAGNOSIS — E119 Type 2 diabetes mellitus without complications: Secondary | ICD-10-CM | POA: Diagnosis not present

## 2024-04-12 ENCOUNTER — Telehealth: Payer: Self-pay | Admitting: Family Medicine

## 2024-04-12 DIAGNOSIS — E538 Deficiency of other specified B group vitamins: Secondary | ICD-10-CM

## 2024-04-12 DIAGNOSIS — I10 Essential (primary) hypertension: Secondary | ICD-10-CM

## 2024-04-12 DIAGNOSIS — E119 Type 2 diabetes mellitus without complications: Secondary | ICD-10-CM

## 2024-04-12 DIAGNOSIS — E1169 Type 2 diabetes mellitus with other specified complication: Secondary | ICD-10-CM

## 2024-04-12 NOTE — Telephone Encounter (Signed)
-----   Message from Harlene Du sent at 04/01/2024  4:27 PM EDT ----- Regarding: Lab Wed 04/13/24 Hello,  Patient is coming in for CPE labs on Wednesday 04/13/24. Can we get orders please.   Thanks

## 2024-04-13 ENCOUNTER — Other Ambulatory Visit (INDEPENDENT_AMBULATORY_CARE_PROVIDER_SITE_OTHER)

## 2024-04-13 ENCOUNTER — Ambulatory Visit: Payer: Self-pay | Admitting: Family Medicine

## 2024-04-13 DIAGNOSIS — Z7984 Long term (current) use of oral hypoglycemic drugs: Secondary | ICD-10-CM

## 2024-04-13 DIAGNOSIS — E119 Type 2 diabetes mellitus without complications: Secondary | ICD-10-CM | POA: Diagnosis not present

## 2024-04-13 DIAGNOSIS — I10 Essential (primary) hypertension: Secondary | ICD-10-CM | POA: Diagnosis not present

## 2024-04-13 DIAGNOSIS — E785 Hyperlipidemia, unspecified: Secondary | ICD-10-CM | POA: Diagnosis not present

## 2024-04-13 DIAGNOSIS — E538 Deficiency of other specified B group vitamins: Secondary | ICD-10-CM

## 2024-04-13 DIAGNOSIS — E1169 Type 2 diabetes mellitus with other specified complication: Secondary | ICD-10-CM | POA: Diagnosis not present

## 2024-04-13 LAB — COMPREHENSIVE METABOLIC PANEL WITH GFR
ALT: 10 U/L (ref 0–35)
AST: 14 U/L (ref 0–37)
Albumin: 4.1 g/dL (ref 3.5–5.2)
Alkaline Phosphatase: 75 U/L (ref 39–117)
BUN: 15 mg/dL (ref 6–23)
CO2: 31 meq/L (ref 19–32)
Calcium: 9 mg/dL (ref 8.4–10.5)
Chloride: 103 meq/L (ref 96–112)
Creatinine, Ser: 1.03 mg/dL (ref 0.40–1.20)
GFR: 49.23 mL/min — ABNORMAL LOW (ref >60.00)
Glucose, Bld: 91 mg/dL (ref 70–99)
Potassium: 4.2 meq/L (ref 3.5–5.1)
Sodium: 142 meq/L (ref 135–145)
Total Bilirubin: 0.6 mg/dL (ref 0.2–1.2)
Total Protein: 6.5 g/dL (ref 6.0–8.3)

## 2024-04-13 LAB — CBC WITH DIFFERENTIAL/PLATELET
Basophils Absolute: 0.1 K/uL (ref 0.0–0.1)
Basophils Relative: 1.2 % (ref 0.0–3.0)
Eosinophils Absolute: 0.3 K/uL (ref 0.0–0.7)
Eosinophils Relative: 5.5 % — ABNORMAL HIGH (ref 0.0–5.0)
HCT: 43.5 % (ref 36.0–46.0)
Hemoglobin: 14.4 g/dL (ref 12.0–15.0)
Lymphocytes Relative: 37.1 % (ref 12.0–46.0)
Lymphs Abs: 2.3 K/uL (ref 0.7–4.0)
MCHC: 33.1 g/dL (ref 30.0–36.0)
MCV: 95.3 fl (ref 78.0–100.0)
Monocytes Absolute: 0.5 K/uL (ref 0.1–1.0)
Monocytes Relative: 8.7 % (ref 3.0–12.0)
Neutro Abs: 2.9 K/uL (ref 1.4–7.7)
Neutrophils Relative %: 47.5 % (ref 43.0–77.0)
Platelets: 225 K/uL (ref 150.0–400.0)
RBC: 4.56 Mil/uL (ref 3.87–5.11)
RDW: 13.4 % (ref 11.5–15.5)
WBC: 6.2 K/uL (ref 4.0–10.5)

## 2024-04-13 LAB — VITAMIN B12: Vitamin B-12: 833 pg/mL (ref 211–911)

## 2024-04-13 LAB — LIPID PANEL
Cholesterol: 198 mg/dL (ref 0–200)
HDL: 47.6 mg/dL (ref >39.00)
LDL Cholesterol: 125 mg/dL — ABNORMAL HIGH (ref 0–99)
NonHDL: 150.44
Total CHOL/HDL Ratio: 4
Triglycerides: 126 mg/dL (ref 0.0–149.0)
VLDL: 25.2 mg/dL (ref 0.0–40.0)

## 2024-04-13 LAB — MICROALBUMIN / CREATININE URINE RATIO
Creatinine,U: 85.3 mg/dL
Microalb Creat Ratio: 11.8 mg/g (ref 0.0–30.0)
Microalb, Ur: 1 mg/dL (ref 0.0–1.9)

## 2024-04-13 LAB — TSH: TSH: 4.97 u[IU]/mL (ref 0.35–5.50)

## 2024-04-13 LAB — HEMOGLOBIN A1C: Hgb A1c MFr Bld: 6.8 % — ABNORMAL HIGH (ref 4.6–6.5)

## 2024-04-20 ENCOUNTER — Encounter: Payer: Self-pay | Admitting: Family Medicine

## 2024-04-20 ENCOUNTER — Ambulatory Visit: Admitting: Family Medicine

## 2024-04-20 VITALS — BP 122/78 | HR 63 | Temp 98.1°F | Ht 67.0 in | Wt 191.4 lb

## 2024-04-20 DIAGNOSIS — I2510 Atherosclerotic heart disease of native coronary artery without angina pectoris: Secondary | ICD-10-CM

## 2024-04-20 DIAGNOSIS — E1169 Type 2 diabetes mellitus with other specified complication: Secondary | ICD-10-CM | POA: Diagnosis not present

## 2024-04-20 DIAGNOSIS — T466X5A Adverse effect of antihyperlipidemic and antiarteriosclerotic drugs, initial encounter: Secondary | ICD-10-CM | POA: Insufficient documentation

## 2024-04-20 DIAGNOSIS — E785 Hyperlipidemia, unspecified: Secondary | ICD-10-CM

## 2024-04-20 DIAGNOSIS — E538 Deficiency of other specified B group vitamins: Secondary | ICD-10-CM | POA: Diagnosis not present

## 2024-04-20 DIAGNOSIS — Z7984 Long term (current) use of oral hypoglycemic drugs: Secondary | ICD-10-CM

## 2024-04-20 DIAGNOSIS — E119 Type 2 diabetes mellitus without complications: Secondary | ICD-10-CM

## 2024-04-20 DIAGNOSIS — Z0000 Encounter for general adult medical examination without abnormal findings: Secondary | ICD-10-CM

## 2024-04-20 DIAGNOSIS — I480 Paroxysmal atrial fibrillation: Secondary | ICD-10-CM | POA: Diagnosis not present

## 2024-04-20 DIAGNOSIS — E1151 Type 2 diabetes mellitus with diabetic peripheral angiopathy without gangrene: Secondary | ICD-10-CM

## 2024-04-20 DIAGNOSIS — Z23 Encounter for immunization: Secondary | ICD-10-CM

## 2024-04-20 DIAGNOSIS — I10 Essential (primary) hypertension: Secondary | ICD-10-CM

## 2024-04-20 DIAGNOSIS — R7989 Other specified abnormal findings of blood chemistry: Secondary | ICD-10-CM

## 2024-04-20 DIAGNOSIS — F4320 Adjustment disorder, unspecified: Secondary | ICD-10-CM

## 2024-04-20 MED ORDER — METFORMIN HCL ER 500 MG PO TB24: ORAL_TABLET | ORAL | 3 refills | Status: AC

## 2024-04-20 MED ORDER — GLIPIZIDE ER 10 MG PO TB24: 10.0000 mg | ORAL_TABLET | Freq: Every day | ORAL | 3 refills | Status: AC

## 2024-04-20 NOTE — Assessment & Plan Note (Signed)
 Lab Results  Component Value Date   VITAMINB12 833 04/13/2024   Continues oral supplementation

## 2024-04-20 NOTE — Assessment & Plan Note (Signed)
 Gradually improving .

## 2024-04-20 NOTE — Progress Notes (Signed)
 Subjective:    Patient ID: Margaret Vazquez, female    DOB: 20-Jan-1936, 88 y.o.   MRN: [id number]  HPI  Here for health maintenance exam and to review chronic medical problems   Wt Readings from Last 3 Encounters:  04/20/24 191 lb 6 oz (86.8 kg)  04/04/24 180 lb (81.6 kg)  03/15/24 187 lb 2 oz (84.9 kg)   29.97 kg/m  Vitals:   04/20/24 0956  BP: 122/78  Pulse: 63  Temp: 98.1 F (36.7 C)  SpO2: 94%    Immunization History  Administered Date(s) Administered   Fluad Quad(high Dose 65+) 03/23/2019, 03/26/2020, 05/08/2021, 07/01/2022   Fluad Trivalent(High Dose 65+) 04/27/2023   INFLUENZA, HIGH DOSE SEASONAL PF 04/20/2024   Influenza Split 05/05/2011, 04/28/2012   Influenza Whole 04/24/2005, 06/03/2010   Influenza,inj,Quad PF,6+ Mos 05/16/2013, 09/13/2018   Influenza-Unspecified 04/01/2014   PFIZER(Purple Top)SARS-COV-2 Vaccination 09/07/2019, 09/19/2019, 09/28/2019, 10/10/2019, 06/08/2020   Pneumococcal Conjugate-13 08/22/2015   Pneumococcal Polysaccharide-23 12/03/2010   Td 11/27/1997, 10/28/2011   Tdap 05/08/2023    Health Maintenance Due  Topic Date Due   FOOT EXAM  10/13/2023   Back pain  Some changes in feeling of left foot   Flu shot - will do today    Declines shingrix vaccine   Mammogram 2022 , declines due to age  Self breast exam-no lumps   Gyn health No problems   Colon cancer screening -out aged /history of polyps in past   Bone health  Dexa 04/2022 -normal BMD Falls-none  Fractures-none  Supplements -none  Exercise  Cannot be on her feet due to back pain   Dermatology follow up in july Has had skin cancer   Has hearing aides  Not as helpful as she wanted - follow up soon/ may turn up the volume   Mood    04/20/2024   10:00 AM 04/04/2024    2:46 PM 03/30/2023   10:51 AM 11/07/2021    9:06 AM 04/01/2021    2:15 PM  Depression screen PHQ 2/9  Decreased Interest 0 1 0 0 0  Down, Depressed, Hopeless 0 1 0 0 1  PHQ - 2 Score 0 2 0 0  1  Altered sleeping 0 0 0    Tired, decreased energy 0 0 3    Change in appetite 0 0 0    Feeling bad or failure about yourself  0 0 0    Trouble concentrating 0 0 0    Moving slowly or fidgety/restless 0 0 0    Suicidal thoughts 0 0 0    PHQ-9 Score 0 2 3    Difficult doing work/chores Not difficult at all Not difficult at all Somewhat difficult     No energy  Cannot do much due to back pain -long time  Worse    HTN bp is stable today  No cp or palpitations or headaches or edema  No side effects to medicines  BP Readings from Last 3 Encounters:  04/20/24 122/78  04/04/24 130/70  03/15/24 130/70     Lab Results  Component Value Date   NA 142 04/13/2024   K 4.2 04/13/2024   CO2 31 04/13/2024   GLUCOSE 91 04/13/2024   BUN 15 04/13/2024   CREATININE 1.03 04/13/2024   CALCIUM  9.0 04/13/2024   GFR 49.23 (L) 04/13/2024   GFRNONAA 54 (L) 01/25/2024   Amlodipine  2.5 mg daily  Imdur  60 mg daily  Lasix  20 mg every other day  Prn propranolol   (  a fib)   Eliquis  for a fib  Just had a monitor -waiting to hear At times her watch tells her she has a fib    DM2 Lab Results  Component Value Date   HGBA1C 6.8 (H) 04/13/2024   HGBA1C 6.4 11/04/2023   HGBA1C 5.9 (A) 04/27/2023   Lab Results  Component Value Date   MICROALBUR 1.0 04/13/2024   MICROALBUR 0.5 05/27/2010   Glipizide  xl 10 mg daily  Metformin  xr 500 mg daily   Intol of farxiga  from cardiology in past   No low glucose  Usually low 100s in am   Eating is not as good Ice cream  Eating vegetables  ? Enough protein   Hyperlipidemia Lab Results  Component Value Date   CHOL 198 04/13/2024   CHOL 168 11/04/2023   CHOL 155 01/12/2023   Lab Results  Component Value Date   HDL 47.60 04/13/2024   HDL 42.00 11/04/2023   HDL 44.80 01/12/2023   Lab Results  Component Value Date   LDLCALC 125 (H) 04/13/2024   LDLCALC 92 11/04/2023   LDLCALC 87 01/12/2023   Lab Results  Component Value Date   TRIG  126.0 04/13/2024   TRIG 172.0 (H) 11/04/2023   TRIG 117.0 01/12/2023   Lab Results  Component Value Date   CHOLHDL 4 04/13/2024   CHOLHDL 4 11/04/2023   CHOLHDL 3 01/12/2023   Lab Results  Component Value Date   LDLDIRECT 119.0 05/01/2021   LDLDIRECT 107.0 08/27/2016   LDLDIRECT 136.5 10/20/2011   Crestor  5 mg twice daily- had to stop due to muscle pain    B12 def Lab Results  Component Value Date   VITAMINB12 833 04/13/2024   Oral supplementation    Lab Results  Component Value Date   TSH 4.97 04/13/2024   In normal range   Lab Results  Component Value Date   ALT 10 04/13/2024   AST 14 04/13/2024   ALKPHOS 75 04/13/2024   BILITOT 0.6 04/13/2024    Lab Results  Component Value Date   WBC 6.2 04/13/2024   HGB 14.4 04/13/2024   HCT 43.5 04/13/2024   MCV 95.3 04/13/2024   PLT 225.0 04/13/2024     Patient Active Problem List   Diagnosis Date Noted   Statin myopathy 04/20/2024   Chest pain 01/27/2024   Light headed 11/04/2023   Blurred vision 04/27/2023   Diabetes mellitus treated with oral medication (HCC) 04/27/2023   Side effect of medication 02/13/2023   Estrogen deficiency 11/07/2021   Grief reaction 05/08/2021   Epiphora due to insufficient drainage of both sides 09/10/2020   Punctal ectropions of both eyes 09/10/2020   Punctal stenosis, acquired, bilateral 09/10/2020   Senile ectropion of both lower eyelids 09/10/2020   History of loop recorder 09/06/2019   History of seizure 03/23/2019   Left-sided epistaxis 11/17/2018   Medicare annual wellness visit, subsequent 09/13/2018   Coronary artery disease 12/24/2017   Routine general medical examination at a health care facility 08/24/2016   PAF (paroxysmal atrial fibrillation) (HCC) 04/07/2016   Left knee pain 06/14/2015   Overweight (BMI 25.0-29.9) 08/23/2014   Type 2 diabetes mellitus with diabetic peripheral angiopathy without gangrene, without long-term current use of insulin  (HCC) 01/12/2014    History of CVA (cerebrovascular accident) 01/11/2014   Cardiac device in situ 06/07/2013   Fatigue 06/07/2013   Sinusitis, chronic 05/16/2013   H/O: CVA (cerebrovascular accident) 02/15/2013   Encounter for Medicare annual wellness exam 10/27/2012   Encopresis  12/15/2011   Post-menopausal 10/28/2011   History of colonic polyps 09/04/2009   B12 deficiency 03/06/2009   ALLERGIC RHINITIS 02/14/2008   BACK PAIN, LUMBAR 11/03/2007   Hyperlipidemia associated with type 2 diabetes mellitus (HCC) 11/24/2006   Essential hypertension 11/24/2006   GERD 10/09/2006   OVERACTIVE BLADDER 10/09/2006   INCONTINENCE, URGE 10/09/2006   SKIN CANCER, HX OF 10/09/2006   Past Medical History:  Diagnosis Date   Allergy history, drug    Aspirin   Basal cell carcinoma    back   CAD (coronary artery disease)    a. Previously nonobstructive then progressive angina with abnl CT -> cardiac cath 12/24/17 showed 30% mid RCA and 80% prox-mid Cx. She received DES to mid AV groove Cx. EF 55-65%.    Cervical stenosis of spine    With neck pain   CKD (chronic kidney disease), stage II    Colon polyps 2009   Complication of anesthesia 1980s   slow to wake after anesthesia when I had breast biopsy   GERD (gastroesophageal reflux disease)    Hyperlipidemia    myalgias with Lipitor and Zetia   Hypertension    Migraine    stopped at age 83 (12/24/2017)   Squamous carcinoma    iced off and cut off; mostly arms (12/24/2017)   Stroke [facility]    told me I'd had 2 strokes in 02/2013; denies residual on 12/24/2017   TIA (transient ischemic attack)    Type II diabetes mellitus (HCC)    Past Surgical History:  Procedure Laterality Date   ABD US   07/2003   Negative   APPENDECTOMY     BASAL CELL CARCINOMA EXCISION     back   BREAST BIOPSY Left 1992   benign   CARDIAC CATHETERIZATION  04/07/2011   non obst CAD (Dr Wonda)   CATARACT EXTRACTION W/PHACO Left 04/01/2017   Procedure: CATARACT EXTRACTION  PHACO AND INTRAOCULAR LENS PLACEMENT (IOC) LEFT DIABETIC;  Surgeon: Mittie Gaskin, MD;  Location: [facility];  Service: Ophthalmology;  Laterality: Left;  Diabetic - oral meds   CATARACT EXTRACTION W/PHACO Right 04/29/2017   Procedure: CATARACT EXTRACTION PHACO AND INTRAOCULAR LENS PLACEMENT (IOC) RIGHT DIABETIC;  Surgeon: Mittie Gaskin, MD;  Location: [facility];  Service: Ophthalmology;  Laterality: Right;  Diabetic - oral meds   COLONOSCOPY  12/2007   Adenomatous colon polyps   CORONARY ANGIOPLASTY WITH STENT PLACEMENT  12/24/2017   CORONARY STENT INTERVENTION N/A 12/24/2017   Procedure: CORONARY STENT INTERVENTION;  Surgeon: Court Dorn PARAS, MD;  Location: [facility];  Service: Cardiovascular;  Laterality: N/A;   CYSTOSCOPY W/ DECANNULATION  03/2000   Normal   LEFT HEART CATH AND CORONARY ANGIOGRAPHY N/A 12/24/2017   Procedure: LEFT HEART CATH AND CORONARY ANGIOGRAPHY;  Surgeon: Court Dorn PARAS, MD;  Location: [facility];  Service: Cardiovascular;  Laterality: N/A;   LOOP RECORDER IMPLANT N/A 02/16/2013   Procedure: LOOP RECORDER IMPLANT;  Surgeon: Elspeth JAYSON Sage, MD;  Location: [facility] C[facility];  Service: Cardiovascular;  Laterality: N/A;   MOHS SURGERY     right hand    NASAL SINUS SURGERY  01/2005   skin cancer removed  2024   arm and leg   SQUAMOUS CELL CARCINOMA EXCISION     mostly arms; (12/24/2017)   STRESS CARDIOLITE   11/1999   Normal/ negative   TEAR DUCT PROBING  2005   ? side   TEE WITHOUT CARDIOVERSION N/A 02/16/2013   Procedure: TRANSESOPHAGEAL ECHOCARDIOGRAM (  TEE);  Surgeon: Maude JAYSON Emmer, MD;  Location: [facility] END[facility];  Service: Cardiovascular;  Laterality: N/A;   TUBAL LIGATION     BTL   Social History   Tobacco Use   Smoking status: Never   Smokeless tobacco: Never  Vaping Use   Vaping status: Never Used  Substance Use Topics   Alcohol use: Never    Alcohol/week: 0.0 standard drinks of alcohol   Drug use:  Never   Family History  Problem Relation Age of Onset   Lung cancer Brother    Diabetes Brother    Pancreatic cancer Brother    Heart disease Mother    Heart disease Father    Brain cancer Other    Skin cancer Daughter    Diabetes Sister    Breast cancer Sister    Colon cancer Neg Hx    Allergies  Allergen Reactions   Bee Venom Hives, Shortness Of Breath and Swelling   Nabumetone Anaphylaxis   Amoxicillin-Pot Clavulanate Hives and Swelling    To lips.   Aspirin Hives   Atorvastatin Swelling     joint pain/swelling, inc liver tests   Clopidogrel Bisulfate Hives   Codeine Nausea And Vomiting   Ezetimibe Other (See Comments)     fatigue   Metformin  And Related Other (See Comments)    Diarrhea    Valsartan Other (See Comments)     fatigue   Crestor  [Rosuvastatin ]     Joint and muscle pain    Farxiga  [Dapagliflozin ]     Perineal discomfort  Increased urinary incontinence Fatigue  Joint pain    Jardiance  [Empagliflozin ]     Did not tolerate   Other Hives    **Red Meat**  SOB   Current Outpatient Medications on File Prior to Visit  Medication Sig Dispense Refill   amLODipine  (NORVASC ) 2.5 MG tablet TAKE 1 TABLET(2.5 MG) BY MOUTH EVERY DAY 90 tablet 1   blood glucose meter kit and supplies Dispense based on patient and insurance preference. Use up to four times daily as directed. (FOR ICD-10 E10.9, E11.9). 1 each 0   Blood Glucose Monitoring Suppl (ONE TOUCH ULTRA 2) w/Device KIT Use to check blood sugar once daily 1 each 0   Cyanocobalamin  (VITAMIN B-12 PO) Take 1 tablet by mouth daily.     diphenhydrAMINE  (BENADRYL ) 25 MG tablet Take 50 mg by mouth every 8 (eight) hours as needed for allergies.      ELIQUIS  5 MG TABS tablet TAKE 1 TABLET(5 MG) BY MOUTH TWICE DAILY 60 tablet 5   EPINEPHrine  (EPI-PEN) 0.3 mg/0.3 mL DEVI Inject 0.3 mg into the muscle daily as needed (allergic reaction).     fluticasone (FLONASE) 50 MCG/ACT nasal spray Place 2 sprays into both nostrils 2  (two) times daily as needed. Reported on 09/10/2015     furosemide  (LASIX ) 20 MG tablet Take 1 tablet (20 mg total) by mouth every other day. 90 tablet 3   glucose blood (ONETOUCH ULTRA) test strip USE TO CHECK BLOOD SUGAR ONCE DAILY AS DIRECTED 100 strip 1   isosorbide  mononitrate (IMDUR ) 60 MG 24 hr tablet TAKE 1 TABLET(60 MG) BY MOUTH DAILY 90 tablet 1   nitroGLYCERIN  (NITROSTAT ) 0.4 MG SL tablet Place 1 tablet (0.4 mg total) under the tongue every 5 (five) minutes as needed for chest pain (up to 3 doses. If taking 3rd dose call 911). 15 tablet 1   propranolol  (INDERAL ) 10 MG tablet Take 1 tablet (10 mg) by mouth every 1 hour x  3 doses as needed for chest pain/ fast heart rates 30 tablet 1   terconazole  (TERAZOL 3 ) 0.8 % vaginal cream Use externally daily as needed for yeast infection symptoms 20 g 0   Current Facility-Administered Medications on File Prior to Visit  Medication Dose Route Frequency Provider Last Rate Last Admin   Study - ORION 4 - inclisiran 300 mg/1.63mL or placebo SQ injection (PI-Stuckey)  300 mg Subcutaneous Q6 months Morris Debby BIRCH, MD        Review of Systems  Constitutional:  Positive for fatigue. Negative for activity change, appetite change, fever and unexpected weight change.  HENT:  Negative for congestion, rhinorrhea, sore throat and trouble swallowing.   Eyes:  Negative for pain, redness, itching and visual disturbance.  Respiratory:  Negative for cough, chest tightness, shortness of breath and wheezing.   Cardiovascular:  Negative for chest pain and palpitations.  Gastrointestinal:  Negative for abdominal pain, blood in stool, constipation, diarrhea and nausea.  Endocrine: Negative for cold intolerance, heat intolerance, polydipsia and polyuria.  Genitourinary:  Negative for difficulty urinating, dysuria, frequency and urgency.  Musculoskeletal:  Positive for back pain. Negative for arthralgias, joint swelling and myalgias.       Back pain (low) limits her  activity  Can travel into her right leg    Skin:  Negative for pallor and rash.       Has trouble cutting toe nails   Neurological:  Negative for dizziness, tremors, weakness, numbness and headaches.       Some tingling in feet at times   Hematological:  Negative for adenopathy. Does not bruise/bleed easily.  Psychiatric/Behavioral:  Negative for decreased concentration and dysphoric mood. The patient is not nervous/anxious.        Objective:   Physical Exam Constitutional:      General: She is not in acute distress.    Appearance: Normal appearance. She is well-developed. She is obese. She is not ill-appearing or diaphoretic.  HENT:     Head: Normocephalic and atraumatic.     Right Ear: Tympanic membrane, ear canal and external ear normal.     Left Ear: Tympanic membrane, ear canal and external ear normal.     Nose: Nose normal. No congestion.     Mouth/Throat:     Mouth: Mucous membranes are moist.     Pharynx: Oropharynx is clear. No posterior oropharyngeal erythema.  Eyes:     General: No scleral icterus.    Extraocular Movements: Extraocular movements intact.     Conjunctiva/sclera: Conjunctivae normal.     Pupils: Pupils are equal, round, and reactive to light.  Neck:     Thyroid : No thyromegaly.     Vascular: No carotid bruit or JVD.  Cardiovascular:     Rate and Rhythm: Normal rate and regular rhythm.     Pulses: Normal pulses.     Heart sounds: Normal heart sounds.     No gallop.  Pulmonary:     Effort: Pulmonary effort is normal. No respiratory distress.     Breath sounds: Normal breath sounds. No wheezing.     Comments: Good air exch Chest:     Chest wall: No tenderness.  Abdominal:     General: Bowel sounds are normal. There is no distension or abdominal bruit.     Palpations: Abdomen is soft. There is no mass.     Tenderness: There is no abdominal tenderness.     Hernia: No hernia is present.  Genitourinary:    Comments:  Breast exam: No mass, nodules,  thickening, tenderness, bulging, retraction, inflamation, nipple discharge or skin changes noted.  No axillary or clavicular LA.     Musculoskeletal:        General: No tenderness. Normal range of motion.     Cervical back: Normal range of motion and neck supple. No rigidity. No muscular tenderness.     Right lower leg: No edema.     Left lower leg: No edema.     Comments: No kyphosis   Limited rom of LS  Lymphadenopathy:     Cervical: No cervical adenopathy.  Skin:    General: Skin is warm and dry.     Coloration: Skin is not pale.     Findings: No erythema or rash.     Comments: Solar lentigines diffusely/ solar aging  Some sks    Neurological:     Mental Status: She is alert. Mental status is at baseline.     Cranial Nerves: No cranial nerve deficit.     Motor: No abnormal muscle tone.     Coordination: Coordination normal.     Gait: Gait normal.     Deep Tendon Reflexes: Reflexes are normal and symmetric. Reflexes normal.  Psychiatric:        Mood and Affect: Mood normal.        Cognition and Memory: Cognition and memory normal.           Assessment & Plan:   Problem List Items Addressed This Visit       Cardiovascular and Mediastinum   Type 2 diabetes mellitus with diabetic peripheral angiopathy without gangrene, without long-term current use of insulin  (HCC)   Lab Results  Component Value Date   HGBA1C 6.8 (H) 04/13/2024   HGBA1C 6.4 11/04/2023   HGBA1C 5.9 (A) 04/27/2023   Encouraged low glycemic diet  Normal foot exam but needs help with nails- referral made to podiatry   Continues Glipizide  xl 10 mg daily  Metformin  xr 500 mg daily  No low readings         Relevant Medications   glipiZIDE  (GLUCOTROL  XL) 10 MG 24 hr tablet   metFORMIN  (GLUCOPHAGE -XR) 500 MG 24 hr tablet   PAF (paroxysmal atrial fibrillation) (HCC)   Per pt had monitor lately  Pending cardiology follow up   Not symptomatic today  Has propranolol  to take prn  Continues  eliquis       Essential hypertension   bp in fair control at this time  BP Readings from Last 1 Encounters:  04/20/24 122/78  This is usually lower at home and also in cardiology office  Most recent labs reviewed as well as cardiology note Disc lifstyle change with low sodium diet and exercise  Plans to continue Amlodipine  2.5 mg daily  imdur  60 mg daily   Lasix  20 mg every other day Prn propranolol         Coronary artery disease   Continues cardiology care Taking isosorbide   No cp         Endocrine   Hyperlipidemia associated with type 2 diabetes mellitus (HCC)   Intolerant of statin medicines Disc goals for lipids and reasons to control them Rev last labs with pt Rev low sat fat diet in detail Under cardiology care LDL 125       Relevant Medications   glipiZIDE  (GLUCOTROL  XL) 10 MG 24 hr tablet   metFORMIN  (GLUCOPHAGE -XR) 500 MG 24 hr tablet   Diabetes mellitus treated with oral medication (HCC)   Relevant  Medications   glipiZIDE  (GLUCOTROL  XL) 10 MG 24 hr tablet   metFORMIN  (GLUCOPHAGE -XR) 500 MG 24 hr tablet     Musculoskeletal and Integument   Statin myopathy   Intolerant of statins         Other   Routine general medical examination at a health care facility - Primary   Reviewed health habits including diet and exercise and skin cancer prevention Reviewed appropriate screening tests for age  Also reviewed health mt list, fam hx and immunization status , as well as social and family history   See HPI Labs reviewed and ordered Health Maintenance  Topic Date Due   Complete foot exam   10/13/2023   Breast Cancer Screening  04/20/2025*   COVID-19 Vaccine (6 - 2025-26 season) 05/06/2026*   Zoster (Shingles) Vaccine (1 of 2) 04/20/2028*   Eye exam for diabetics  10/11/2024   Hemoglobin A1C  10/12/2024   Medicare Annual Wellness Visit  04/04/2025   DTaP/Tdap/Td vaccine (4 - Td or Tdap) 05/07/2033   Pneumococcal Vaccine for age over 13  Completed    Flu Shot  Completed   DEXA scan (bone density measurement)  Completed   Meningitis B Vaccine  Aged Out  *Topic was postponed. The date shown is not the original due date.    Flu shot given  Declines shingrix  Declines mammogram  No falls or fractures  Discussed fall prevention, supplements and exercise for bone density  Utd dexa  Discussed options for strength training exercise  PHQ 0 Wil follow up for visit for back pain       Grief reaction   Gradually improving       RESOLVED: Elevated TSH   TSH in normal range       B12 deficiency   Lab Results  Component Value Date   VITAMINB12 833 04/13/2024   Continues oral supplementation       Other Visit Diagnoses       Need for influenza vaccination       Relevant Orders   Flu vaccine HIGH DOSE PF(Fluzone Trivalent) (Completed)

## 2024-04-20 NOTE — Assessment & Plan Note (Signed)
 Intolerant of statin medicines Disc goals for lipids and reasons to control them Rev last labs with pt Rev low sat fat diet in detail Under cardiology care LDL 125

## 2024-04-20 NOTE — Assessment & Plan Note (Signed)
 Per pt had monitor lately  Pending cardiology follow up   Not symptomatic today  Has propranolol  to take prn  Continues eliquis 

## 2024-04-20 NOTE — Assessment & Plan Note (Signed)
 bp in fair control at this time  BP Readings from Last 1 Encounters:  04/20/24 122/78  This is usually lower at home and also in cardiology office  Most recent labs reviewed as well as cardiology note Disc lifstyle change with low sodium diet and exercise  Plans to continue Amlodipine  2.5 mg daily  imdur  60 mg daily   Lasix  20 mg every other day Prn propranolol 

## 2024-04-20 NOTE — Assessment & Plan Note (Signed)
TSH in normal range.

## 2024-04-20 NOTE — Patient Instructions (Addendum)
 For exercise  Add some strength training to your routine, this is important for bone and brain health and can reduce your risk of falls and help your body use insulin  properly and regulate weight  Light weights, exercise bands , and internet videos are a good way to start  Yoga (chair or regular), machines , floor exercises or a gym with machines are also good options    Start vitamin D3  2000 international units daily over the counter  This is for bone health   Work on fluid intake for kidney health    For diabetes and cholesterol Avoid added sugars in your diet when you can  Try to get most of your carbohydrates from produce (with the exception of white potatoes) and whole grains Eat less bread/pasta/rice/snack foods/cereals/sweets and other items from the middle of the grocery store (processed carbs)  Avoid red meat/ fried foods/ egg yolks/ fatty breakfast meats/ butter, cheese and high fat dairy/ and shellfish     The following are examples of protein in diet  Meat (lean)  Fish  Eggs  Dairy products  Soy products  Oat milk  Almond milk Nuts and nut butters  Dried beans Legumes    I want to set you up with a podiatrist for nail care  I put the referral in for podiatry  Please let us  know if you don't hear in 1-2 weeks to set that up (mychart message or call or letter)

## 2024-04-20 NOTE — Assessment & Plan Note (Signed)
 Continues cardiology care Taking isosorbide   No cp

## 2024-04-20 NOTE — Assessment & Plan Note (Signed)
Intolerant of statins

## 2024-04-20 NOTE — Assessment & Plan Note (Signed)
 Lab Results  Component Value Date   HGBA1C 6.8 (H) 04/13/2024   HGBA1C 6.4 11/04/2023   HGBA1C 5.9 (A) 04/27/2023   Encouraged low glycemic diet  Normal foot exam but needs help with nails- referral made to podiatry   Continues Glipizide  xl 10 mg daily  Metformin  xr 500 mg daily  No low readings

## 2024-04-20 NOTE — Assessment & Plan Note (Signed)
 Reviewed health habits including diet and exercise and skin cancer prevention Reviewed appropriate screening tests for age  Also reviewed health mt list, fam hx and immunization status , as well as social and family history   See HPI Labs reviewed and ordered Health Maintenance  Topic Date Due   Complete foot exam   10/13/2023   Breast Cancer Screening  04/20/2025*   COVID-19 Vaccine (6 - 2025-26 season) 05/06/2026*   Zoster (Shingles) Vaccine (1 of 2) 04/20/2028*   Eye exam for diabetics  10/11/2024   Hemoglobin A1C  10/12/2024   Medicare Annual Wellness Visit  04/04/2025   DTaP/Tdap/Td vaccine (4 - Td or Tdap) 05/07/2033   Pneumococcal Vaccine for age over 51  Completed   Flu Shot  Completed   DEXA scan (bone density measurement)  Completed   Meningitis B Vaccine  Aged Out  *Topic was postponed. The date shown is not the original due date.    Flu shot given  Declines shingrix  Declines mammogram  No falls or fractures  Discussed fall prevention, supplements and exercise for bone density  Utd dexa  Discussed options for strength training exercise  PHQ 0 Wil follow up for visit for back pain

## 2024-04-24 DIAGNOSIS — I480 Paroxysmal atrial fibrillation: Secondary | ICD-10-CM | POA: Diagnosis not present

## 2024-04-26 ENCOUNTER — Encounter: Payer: Self-pay | Admitting: Podiatry

## 2024-04-26 ENCOUNTER — Ambulatory Visit (INDEPENDENT_AMBULATORY_CARE_PROVIDER_SITE_OTHER): Payer: Self-pay | Admitting: Podiatry

## 2024-04-26 VITALS — Ht 67.0 in | Wt 191.4 lb

## 2024-04-26 DIAGNOSIS — M79675 Pain in left toe(s): Secondary | ICD-10-CM | POA: Diagnosis not present

## 2024-04-26 DIAGNOSIS — M79674 Pain in right toe(s): Secondary | ICD-10-CM

## 2024-04-26 DIAGNOSIS — B351 Tinea unguium: Secondary | ICD-10-CM

## 2024-04-26 NOTE — Progress Notes (Signed)
 Chief Complaint  Patient presents with   Nail Problem    Pt is here for [facility]. Concern about left great toenail.    SUBJECTIVE Patient with a history of diabetes mellitus presents to office today complaining of elongated, thickened nails that cause pain while ambulating in shoes.  Patient is unable to trim their own nails. Patient is here for further evaluation and treatment.  Past Medical History:  Diagnosis Date   Allergy history, drug    Aspirin   Basal cell carcinoma    back   CAD (coronary artery disease)    a. Previously nonobstructive then progressive angina with abnl CT -> cardiac cath 12/24/17 showed 30% mid RCA and 80% prox-mid Cx. She received DES to mid AV groove Cx. EF 55-65%.    Cervical stenosis of spine    With neck pain   CKD (chronic kidney disease), stage II    Colon polyps 2009   Complication of anesthesia 1980s   slow to wake after anesthesia when I had breast biopsy   GERD (gastroesophageal reflux disease)    Hyperlipidemia    myalgias with Lipitor and Zetia   Hypertension    Migraine    stopped at age 88 (12/24/2017)   Squamous carcinoma    iced off and cut off; mostly arms (12/24/2017)   Stroke [facility])    told me I'd had 2 strokes in 02/2013; denies residual on 12/24/2017   TIA (transient ischemic attack)    Type II diabetes mellitus (HCC)     Allergies  Allergen Reactions   Bee Venom Hives, Shortness Of Breath and Swelling   Nabumetone Anaphylaxis   Amoxicillin-Pot Clavulanate Hives and Swelling    To lips.   Aspirin Hives   Atorvastatin Swelling     joint pain/swelling, inc liver tests   Clopidogrel Bisulfate Hives   Codeine Nausea And Vomiting   Ezetimibe Other (See Comments)     fatigue   Metformin  And Related Other (See Comments)    Diarrhea    Valsartan Other (See Comments)     fatigue   Crestor  [Rosuvastatin ]     Joint and muscle pain    Farxiga  [Dapagliflozin ]     Perineal discomfort  Increased urinary  incontinence Fatigue  Joint pain    Jardiance  [Empagliflozin ]     Did not tolerate   Other Hives    **Red Meat**  SOB     OBJECTIVE General Patient is awake, alert, and oriented x 3 and in no acute distress. Derm Skin is dry and supple bilateral. Negative open lesions or macerations. Remaining integument unremarkable. Nails are tender, long, thickened and dystrophic with subungual debris, consistent with onychomycosis, 1-5 bilateral. No signs of infection noted. Vasc  DP and PT pedal pulses palpable bilaterally. Temperature gradient within normal limits.  Neuro Epicritic and protective threshold sensation diminished bilaterally.  Musculoskeletal Exam No symptomatic pedal deformities noted bilateral. Muscular strength within normal limits.  ASSESSMENT 1. Diabetes Mellitus w/ peripheral neuropathy 2.  Pain due to onychomycosis of toenails bilateral  PLAN OF CARE 1. Patient evaluated today. 2. Instructed to maintain good pedal hygiene and foot care. Stressed importance of controlling blood sugar.  3. Mechanical debridement of nails 1-5 bilaterally performed using a nail nipper. Filed with dremel without incident.  4. Return to clinic in 3 mos.     Thresa EMERSON Sar, DPM [facility]  Dr. Thresa EMERSON Sar, DPM    [address]  [place], [place] [address]                Office [phone]  Fax [phone]

## 2024-04-27 ENCOUNTER — Ambulatory Visit: Payer: Self-pay | Admitting: Cardiovascular Disease

## 2024-04-28 ENCOUNTER — Encounter: Payer: Self-pay | Admitting: Emergency Medicine

## 2024-04-29 ENCOUNTER — Emergency Department

## 2024-04-29 ENCOUNTER — Emergency Department
Admission: EM | Admit: 2024-04-29 | Discharge: 2024-04-29 | Disposition: A | Discharge status: Home / Self Care | Attending: Emergency Medicine | Admitting: Emergency Medicine

## 2024-04-29 ENCOUNTER — Other Ambulatory Visit: Payer: Self-pay

## 2024-04-29 ENCOUNTER — Ambulatory Visit: Payer: Self-pay

## 2024-04-29 DIAGNOSIS — I2510 Atherosclerotic heart disease of native coronary artery without angina pectoris: Secondary | ICD-10-CM | POA: Diagnosis not present

## 2024-04-29 DIAGNOSIS — R42 Dizziness and giddiness: Secondary | ICD-10-CM | POA: Insufficient documentation

## 2024-04-29 DIAGNOSIS — R0989 Other specified symptoms and signs involving the circulatory and respiratory systems: Secondary | ICD-10-CM | POA: Diagnosis not present

## 2024-04-29 DIAGNOSIS — M79662 Pain in left lower leg: Secondary | ICD-10-CM | POA: Diagnosis not present

## 2024-04-29 DIAGNOSIS — I129 Hypertensive chronic kidney disease with stage 1 through stage 4 chronic kidney disease, or unspecified chronic kidney disease: Secondary | ICD-10-CM | POA: Insufficient documentation

## 2024-04-29 DIAGNOSIS — N189 Chronic kidney disease, unspecified: Secondary | ICD-10-CM | POA: Insufficient documentation

## 2024-04-29 DIAGNOSIS — R0602 Shortness of breath: Secondary | ICD-10-CM | POA: Diagnosis not present

## 2024-04-29 DIAGNOSIS — E1122 Type 2 diabetes mellitus with diabetic chronic kidney disease: Secondary | ICD-10-CM | POA: Insufficient documentation

## 2024-04-29 DIAGNOSIS — I4891 Unspecified atrial fibrillation: Secondary | ICD-10-CM | POA: Diagnosis not present

## 2024-04-29 DIAGNOSIS — I700 Atherosclerosis of aorta: Secondary | ICD-10-CM | POA: Diagnosis not present

## 2024-04-29 LAB — CK: Total CK: 37 U/L — ABNORMAL LOW (ref 38–234)

## 2024-04-29 LAB — CBC
HCT: 43.3 % (ref 36.0–46.0)
Hemoglobin: 14.5 g/dL (ref 12.0–15.0)
MCH: 31.9 pg (ref 26.0–34.0)
MCHC: 33.5 g/dL (ref 30.0–36.0)
MCV: 95.2 fL (ref 80.0–100.0)
Platelets: 232 K/uL (ref 150–400)
RBC: 4.55 MIL/uL (ref 3.87–5.11)
RDW: 12.5 % (ref 11.5–15.5)
WBC: 6.7 K/uL (ref 4.0–10.5)
nRBC: 0 % (ref 0.0–0.2)

## 2024-04-29 LAB — URINALYSIS, ROUTINE W REFLEX MICROSCOPIC
Bilirubin Urine: NEGATIVE
Glucose, UA: NEGATIVE mg/dL
Hgb urine dipstick: NEGATIVE
Ketones, ur: NEGATIVE mg/dL
Leukocytes,Ua: NEGATIVE
Nitrite: NEGATIVE
Protein, ur: NEGATIVE mg/dL
Specific Gravity, Urine: 1.006 (ref 1.005–1.030)
pH: 5 (ref 5.0–8.0)

## 2024-04-29 LAB — COMPREHENSIVE METABOLIC PANEL WITH GFR
ALT: 13 U/L (ref 0–44)
AST: 17 U/L (ref 15–41)
Albumin: 3.9 g/dL (ref 3.5–5.0)
Alkaline Phosphatase: 83 U/L (ref 38–126)
Anion gap: 10 (ref 5–15)
BUN: 17 mg/dL (ref 8–23)
CO2: 27 mmol/L (ref 22–32)
Calcium: 9.1 mg/dL (ref 8.9–10.3)
Chloride: 102 mmol/L (ref 98–111)
Creatinine, Ser: 0.93 mg/dL (ref 0.44–1.00)
GFR, Estimated: 60 mL/min — ABNORMAL LOW (ref >60)
Glucose, Bld: 95 mg/dL (ref 70–99)
Potassium: 4.2 mmol/L (ref 3.5–5.1)
Sodium: 139 mmol/L (ref 135–145)
Total Bilirubin: 0.8 mg/dL (ref 0.0–1.2)
Total Protein: 7.2 g/dL (ref 6.5–8.1)

## 2024-04-29 LAB — TROPONIN I (HIGH SENSITIVITY)
Troponin I (High Sensitivity): 5 ng/L (ref <18)
Troponin I (High Sensitivity): 5 ng/L (ref <18)

## 2024-04-29 LAB — D-DIMER, QUANTITATIVE: D-Dimer, Quant: 0.45 ug{FEU}/mL (ref 0.00–0.50)

## 2024-04-29 MED ORDER — SODIUM CHLORIDE 0.9 % IV BOLUS
1000.0000 mL | Freq: Once | INTRAVENOUS | Status: AC
  Administered 2024-04-29: 1000 mL via INTRAVENOUS

## 2024-04-29 NOTE — ED Notes (Signed)
 Pt up to toilet

## 2024-04-29 NOTE — Telephone Encounter (Signed)
 Noted.  Currently being seen at ER.

## 2024-04-29 NOTE — ED Provider Notes (Signed)
 Margaret Vazquez Provider Note    Event Date/Time   First MD Initiated Contact with Patient 04/29/24 1522     (approximate)   History   Dizziness   HPI  Margaret Vazquez is a 88 y.o. female with history of CAD, CKD, GERD, hyperlipidemia, hypertension, diabetes, history of TIA, presenting with lightheadedness.  States that she when she was walking around today, felt quite fatigued and weak, so she took her blood pressures and was found to be low.  Called her granddaughter who is an NP who told her to go to the hospital since she was concerned that her grandmother will pass out.  Patient denies any urinary symptoms, no nausea vomiting or diarrhea, states that she has some shortness of breath but that has been ongoing for very long time, no new shortness of breath or chest pain, no fevers at home.  States that she has been taking her medications.  Denies history of blood clots or cancer.  No recent travel or surgeries.  States that while she was in the emergency department she noticed that her left calf was aching.  No trauma or falls.  Dependent history from EMS, blood glucose was 145, she had denied headache or blurry vision for them, no focal weakness or numbness.  Was on Holter monitor last week for history of atrial fibrillation.  No focal deficits rhythm.  GCS 15.   On independent review, Holter monitor placed in October, showed sinus rhythm, no atrial fibrillation, did see rare PACs.   Physical Exam   Triage Vital Signs: ED Triage Vitals  Encounter Vitals Group     BP 04/29/24 1408 127/76     Girls Systolic BP Percentile --      Girls Diastolic BP Percentile --      Boys Systolic BP Percentile --      Boys Diastolic BP Percentile --      Pulse Rate 04/29/24 1408 66     Resp 04/29/24 1408 16     Temp 04/29/24 1409 98.4 F (36.9 C)     Temp Source 04/29/24 1409 Oral     SpO2 04/29/24 1408 97 %     Weight 04/29/24 1409 180 lb (81.6 kg)     Height 04/29/24  1409 5' 7 (1.702 m)     Head Circumference --      Peak Flow --      Pain Score 04/29/24 1408 0     Pain Loc --      Pain Education --      Exclude from Growth Chart --     Most recent vital signs: Vitals:   04/29/24 1409 04/29/24 1800  BP:  (!) 162/61  Pulse:  60  Resp:  17  Temp: 98.4 F (36.9 C) 98 F (36.7 C)  SpO2:  100%     General: Awake, no distress.  CV:  Good peripheral perfusion.  Resp:  Normal effort.  No tachypnea or respiratory distress Abd:  No distention.  Soft nontender Other:  Mild left calf tenderness without swelling, edema, no erythema or wounds.  No lower extremity edema, no facial droop, no focal weakness or numbness.  Equal DP pulses bilaterally.   ED Results / Procedures / Treatments   Labs (all labs ordered are listed, but only abnormal results are displayed) Labs Reviewed  COMPREHENSIVE METABOLIC PANEL WITH GFR - Abnormal; Notable for the following components:      Result Value   GFR, Estimated 60 (*)  All other components within normal limits  URINALYSIS, ROUTINE W REFLEX MICROSCOPIC - Abnormal; Notable for the following components:   Color, Urine STRAW (*)    APPearance CLEAR (*)    All other components within normal limits  CK - Abnormal; Notable for the following components:   Total CK 37 (*)    All other components within normal limits  CBC  D-DIMER, QUANTITATIVE  CBG MONITORING, ED  TROPONIN I (HIGH SENSITIVITY)  TROPONIN I (HIGH SENSITIVITY)     EKG  EKG shows, sinus rhythm, rate 64, normal QRS, normal QTc, baseline is wandering but no obvious ischemic ST elevation, T wave flattening in aVL, not significant change compared to prior   RADIOLOGY On my independent interpretation, chest x-ray without obvious consolidation   PROCEDURES:  Critical Care performed: No  Procedures   MEDICATIONS ORDERED IN ED: Medications  sodium chloride  0.9 % bolus 1,000 mL (1,000 mLs Intravenous New Bag/Given 04/29/24 1610)      IMPRESSION / MDM / ASSESSMENT AND PLAN / ED COURSE  I reviewed the triage vital signs and the nursing notes.                              Differential diagnosis includes, but is not limited to, dehydration, electrolyte derangements, UTI, arrhythmia, atypical ACS, musculoskeletal pain, strain, rhabdomyolysis, also considered DVT.  Consider PE but patient states that she has no new shortness of breath, is not hypoxic or tachycardic.  Will get labs, D-dimer, EKG, troponin, chest x-ray, DVT ultrasound.  IV fluids.  Patient's presentation is most consistent with acute presentation with potential threat to life or bodily function.  Independent interpretation of labs and imaging below.  Labs are reassuring, ultrasound without DVT, chest x-ray without obvious consolidation.  On reassessment patient is feeling a lot better, she was monitored in the emergency department without any drop in blood pressure.  She was able to ambulate without feeling symptomatic.  Considered but no indication for inpatient admission at this time, she safe for outpatient management.  Shared decision making done with patient and she is agreeable with this plan.  Encouraged hydration, discussed with her about following up with her primary care doctor next week to get reassessed.  Strict precautions given.  Discharge.  The patient is on the cardiac monitor to evaluate for evidence of arrhythmia and/or significant heart rate changes.   Clinical Course as of 04/29/24 2000  Fri Apr 29, 2024  1636 Independent review of labs, electrolytes really deranged, LFTs are normal, no leukocytosis, CK is not elevated, troponin is not elevated. [TT]  1644 D-Dimer, Quant: 0.45 Not elevated [TT]  1644 US  Venous Img Lower  Left (DVT Study) No DVT of the left lower extremity.  [TT]  1644 DG Chest 1 View IMPRESSION: 1. No acute cardiopulmonary process. 2. Low lung volumes   [TT]  1829 Urinalysis, Routine w reflex microscopic -Urine,  Clean Catch(!) UA not consistent with UTI, troponin is not elevated. [TT]    Clinical Course User Index [TT] [place], Lorelle Cummins, MD     FINAL CLINICAL IMPRESSION(S) / ED DIAGNOSES   Final diagnoses:  Lightheadedness     Rx / DC Orders   ED Discharge Orders     None        Note:  This document was prepared using Dragon voice recognition software and may include unintentional dictation errors.    Waymond Lorelle Cummins, MD 04/29/24 [place]

## 2024-04-29 NOTE — ED Notes (Signed)
 Pt up to toilet. Pt unable to provide sample at this time. Pt assisted back into bed and placed back on the monitor.

## 2024-04-29 NOTE — ED Triage Notes (Addendum)
 BIBEMS, coming from home. Sudden onset dizziness x1hour ago and SOB with exertion. Denies HA, blurry vision, CP. 12 lead NSR. VSS. BGL: 145. PMH: CVA 2021, a-fib. Pt was on halter monitor last week for a-fib. NIHSS 0. GCS 15

## 2024-04-29 NOTE — ED Notes (Signed)
 US  at bedside.

## 2024-04-29 NOTE — Telephone Encounter (Signed)
 Aware, will watch for correspondence

## 2024-04-29 NOTE — Telephone Encounter (Signed)
 FYI Only or Action Required?: FYI only for provider.  Patient was last seen in primary care on 04/20/2024 by Randeen Laine LABOR, MD.  Called Nurse Triage reporting Hypertension.  Symptoms began today.  Interventions attempted: Nothing.  Symptoms are: unchanged.  Triage Disposition: Call EMS 911 Now  Patient/caregiver understands and will follow disposition?: Unsure  Rn advised pt to go to ER. She attempted to talk RN out of it. Call got disconnected. RN called 911, pt called back wasn't happy but RN advised to let them assess her and go from there. She agreed.    Copied from CRM [id number]. Topic: Clinical - Red Word Triage >> Apr 29, 2024 12:32 PM [place] A wrote: [facility] that prompted transfer to Nurse Triage: Patient called wants to speak with nurse 84/55 blood pressure, patient is feeling dizzy. Reason for Disposition  [1] Systolic BP < 90 AND [2] feeling weak or lightheaded (e.g., woozy, feeling like they might faint)  Answer Assessment - Initial Assessment Questions Pt was trying to take BP multiple times to talk RN out of ER recommendation. She had one that 130 but RN wasn't sure if it was accurate because pt said the 4 before she called were all low 80s. States she gets woozy feeling while standing. Then stood up and said she didn't feel woozy. Rn asked her to check her BP then. Call got disconnected. RN attempted to call home phone number 3 times and cell phone 2x with no answer. RN called 911 for check. They were sending paramedics to her location. PT finally called back in and stated that her pressure was hight when she stood up. RN advised to let paramedics check her out and if her BP was still low to let them take her to the ER to be evaluated. Pt was not happy with this but stated all right then   1. BLOOD PRESSURE: What is your blood pressure? Did you take at least two measurements 5 minutes apart?     84/55- hr 61 2. ONSET: When did you take your blood pressure?      This morning 3. HOW: How did you take your blood pressure? (e.g., visiting nurse, automatic home BP monitor)      4. HISTORY: Do you have a history of low blood pressure? What is your blood pressure normally?     No typically 110's systolic 5. MEDICINES: Are you taking any medicines for blood pressure? If Yes, ask: Have they been changed recently?     Isosorbide , lasix  6. PULSE RATE: Do you know what your pulse rate is?      61 7. OTHER SYMPTOMS: Have you been sick recently? Have you had a recent injury?     Feeling dizzy  Protocols used: Blood Pressure - Low-A-AH

## 2024-04-29 NOTE — Discharge Instructions (Signed)
 Please make sure to keep yourself hydrated, please make sure to follow-up with primary care doctor next week to get reassessed.

## 2024-05-04 DIAGNOSIS — D225 Melanocytic nevi of trunk: Secondary | ICD-10-CM | POA: Diagnosis not present

## 2024-05-04 DIAGNOSIS — D2261 Melanocytic nevi of right upper limb, including shoulder: Secondary | ICD-10-CM | POA: Diagnosis not present

## 2024-05-04 DIAGNOSIS — D485 Neoplasm of uncertain behavior of skin: Secondary | ICD-10-CM | POA: Diagnosis not present

## 2024-05-04 DIAGNOSIS — L570 Actinic keratosis: Secondary | ICD-10-CM | POA: Diagnosis not present

## 2024-05-04 DIAGNOSIS — D2262 Melanocytic nevi of left upper limb, including shoulder: Secondary | ICD-10-CM | POA: Diagnosis not present

## 2024-05-04 DIAGNOSIS — Z85828 Personal history of other malignant neoplasm of skin: Secondary | ICD-10-CM | POA: Diagnosis not present

## 2024-05-04 DIAGNOSIS — D2272 Melanocytic nevi of left lower limb, including hip: Secondary | ICD-10-CM | POA: Diagnosis not present

## 2024-05-11 ENCOUNTER — Ambulatory Visit (INDEPENDENT_AMBULATORY_CARE_PROVIDER_SITE_OTHER)
Admission: RE | Admit: 2024-05-11 | Discharge: 2024-05-11 | Disposition: A | Discharge status: Home / Self Care | Source: Ambulatory Visit | Attending: Family Medicine | Admitting: Family Medicine

## 2024-05-11 ENCOUNTER — Ambulatory Visit: Payer: Self-pay | Admitting: Family Medicine

## 2024-05-11 ENCOUNTER — Other Ambulatory Visit: Payer: Self-pay | Admitting: Family Medicine

## 2024-05-11 ENCOUNTER — Ambulatory Visit: Admitting: Family Medicine

## 2024-05-11 ENCOUNTER — Encounter: Payer: Self-pay | Admitting: Family Medicine

## 2024-05-11 VITALS — BP 106/64 | HR 66 | Temp 97.6°F | Ht 67.0 in | Wt 188.1 lb

## 2024-05-11 DIAGNOSIS — M545 Low back pain, unspecified: Secondary | ICD-10-CM

## 2024-05-11 DIAGNOSIS — M438X6 Other specified deforming dorsopathies, lumbar region: Secondary | ICD-10-CM | POA: Diagnosis not present

## 2024-05-11 DIAGNOSIS — M47816 Spondylosis without myelopathy or radiculopathy, lumbar region: Secondary | ICD-10-CM | POA: Diagnosis not present

## 2024-05-11 DIAGNOSIS — G8929 Other chronic pain: Secondary | ICD-10-CM

## 2024-05-11 DIAGNOSIS — M5186 Other intervertebral disc disorders, lumbar region: Secondary | ICD-10-CM | POA: Diagnosis not present

## 2024-05-11 DIAGNOSIS — M4807 Spinal stenosis, lumbosacral region: Secondary | ICD-10-CM | POA: Diagnosis not present

## 2024-05-11 MED ORDER — PREDNISONE 10 MG PO TABS: ORAL_TABLET | ORAL | 0 refills | Status: DC

## 2024-05-11 NOTE — Progress Notes (Signed)
 Subjective:    Patient ID: Margaret Vazquez, female    DOB: 11-29-1935, 88 y.o.   MRN: [id number]  HPI  Wt Readings from Last 3 Encounters:  05/11/24 188 lb 2 oz (85.3 kg)  04/29/24 180 lb (81.6 kg)  04/26/24 191 lb 6.1 oz (86.8 kg)   29.46 kg/m  Vitals:   05/11/24 0953  BP: 106/64  Pulse: 66  Temp: 97.6 F (36.4 C)  SpO2: 96%   Pt presents for c/o Back pain   When she stands or walks gets pain across low back Left side Does not go down her leg  Sometimes wonders if left leg is not as sensitive as the right   Feels crampy  Sharp-to move Dull in between   Standing is worst  Not improved with walking - worse if walking more than 15 minutes  Best to sit in recliner   No recent falls or injuries   No over the counter med  No heat/ice No stretches  CLINICAL DATA:  Left lower leg pain. Decreased lower extremity strength.   EXAM: LUMBAR SPINE - 2-3 VIEW   COMPARISON:  08/16/2019   FINDINGS: Mild curvature of the lumbar spine convex left without significant change. Vertebral body heights are maintained. There is mild to moderate spondylosis throughout the lumbar spine to include facet arthropathy. Possible subtle grade 1 anterolisthesis of L4 on L5 due to the facet arthropathy. Mild disc space narrowing at the L5-S1 level. Remainder of the exam is unchanged.   IMPRESSION: 1. No acute findings. 2. Mild to moderate spondylosis of the lumbar spine with mild disc disease at the L5-S1 level.     Electronically Signed   By: Toribio Agreste M.D.   On: 05/11/2024 11:09   Patient Active Problem List   Diagnosis Date Noted   Statin myopathy 04/20/2024   Chest pain 01/27/2024   Light headed 11/04/2023   Blurred vision 04/27/2023   Diabetes mellitus treated with oral medication (HCC) 04/27/2023   Side effect of medication 02/13/2023   Estrogen deficiency 11/07/2021   Grief reaction 05/08/2021   Epiphora due to insufficient drainage of both sides 09/10/2020    Punctal ectropions of both eyes 09/10/2020   Punctal stenosis, acquired, bilateral 09/10/2020   Senile ectropion of both lower eyelids 09/10/2020   History of loop recorder 09/06/2019   History of seizure 03/23/2019   Left-sided epistaxis 11/17/2018   Medicare annual wellness visit, subsequent 09/13/2018   Coronary artery disease 12/24/2017   Routine general medical examination at a health care facility 08/24/2016   PAF (paroxysmal atrial fibrillation) (HCC) 04/07/2016   Left knee pain 06/14/2015   Overweight (BMI 25.0-29.9) 08/23/2014   Type 2 diabetes mellitus with diabetic peripheral angiopathy without gangrene, without long-term current use of insulin  (HCC) 01/12/2014   History of CVA (cerebrovascular accident) 01/11/2014   Cardiac device in situ 06/07/2013   Fatigue 06/07/2013   Sinusitis, chronic 05/16/2013   H/O: CVA (cerebrovascular accident) 02/15/2013   Encounter for Medicare annual wellness exam 10/27/2012   Encopresis 12/15/2011   Post-menopausal 10/28/2011   History of colonic polyps 09/04/2009   B12 deficiency 03/06/2009   ALLERGIC RHINITIS 02/14/2008   BACK PAIN, LUMBAR 11/03/2007   Hyperlipidemia associated with type 2 diabetes mellitus (HCC) 11/24/2006   Essential hypertension 11/24/2006   GERD 10/09/2006   OVERACTIVE BLADDER 10/09/2006   INCONTINENCE, URGE 10/09/2006   SKIN CANCER, HX OF 10/09/2006   Past Medical History:  Diagnosis Date   Allergy history, drug  Aspirin   Basal cell carcinoma    back   CAD (coronary artery disease)    a. Previously nonobstructive then progressive angina with abnl CT -> cardiac cath 12/24/17 showed 30% mid RCA and 80% prox-mid Cx. She received DES to mid AV groove Cx. EF 55-65%.    Cervical stenosis of spine    With neck pain   CKD (chronic kidney disease), stage II    Colon polyps 2009   Complication of anesthesia 1980s   slow to wake after anesthesia when I had breast biopsy   GERD (gastroesophageal reflux  disease)    Hyperlipidemia    myalgias with Lipitor and Zetia   Hypertension    Migraine    stopped at age 13 (12/24/2017)   Squamous carcinoma    iced off and cut off; mostly arms (12/24/2017)   Stroke [facility])    told me I'd had 2 strokes in 02/2013; denies residual on 12/24/2017   TIA (transient ischemic attack)    Type II diabetes mellitus (HCC)    Past Surgical History:  Procedure Laterality Date   ABD US   07/2003   Negative   APPENDECTOMY     BASAL CELL CARCINOMA EXCISION     back   BREAST BIOPSY Left 1992   benign   CARDIAC CATHETERIZATION  04/07/2011   non obst CAD (Dr Wonda)   CATARACT EXTRACTION W/PHACO Left 04/01/2017   Procedure: CATARACT EXTRACTION PHACO AND INTRAOCULAR LENS PLACEMENT (IOC) LEFT DIABETIC;  Surgeon: Mittie Gaskin, MD;  Location: [facility];  Service: Ophthalmology;  Laterality: Left;  Diabetic - oral meds   CATARACT EXTRACTION W/PHACO Right 04/29/2017   Procedure: CATARACT EXTRACTION PHACO AND INTRAOCULAR LENS PLACEMENT (IOC) RIGHT DIABETIC;  Surgeon: Mittie Gaskin, MD;  Location: [facility];  Service: Ophthalmology;  Laterality: Right;  Diabetic - oral meds   COLONOSCOPY  12/2007   Adenomatous colon polyps   CORONARY ANGIOPLASTY WITH STENT PLACEMENT  12/24/2017   CORONARY STENT INTERVENTION N/A 12/24/2017   Procedure: CORONARY STENT INTERVENTION;  Surgeon: Court Dorn PARAS, MD;  Location: [facility] IN[facility]AS[facility] CV [facility];  Service: Cardiovascular;  Laterality: N/A;   CYSTOSCOPY W/ DECANNULATION  03/2000   Normal   LEFT HEART CATH AND CORONARY ANGIOGRAPHY N/A 12/24/2017   Procedure: LEFT HEART CATH AND CORONARY ANGIOGRAPHY;  Surgeon: Court Dorn PARAS, MD;  Location: [facility] CV [facility];  Service: Cardiovascular;  Laterality: N/A;   LOOP RECORDER IMPLANT N/A 02/16/2013   Procedure: LOOP RECORDER IMPLANT;  Surgeon: Elspeth JAYSON Sage, MD;  Location: [facility] C[facility];  Service: Cardiovascular;  Laterality: N/A;   MOHS SURGERY     right  hand    NASAL SINUS SURGERY  01/2005   skin cancer removed  2024   arm and leg   SQUAMOUS CELL CARCINOMA EXCISION     mostly arms; (12/24/2017)   STRESS CARDIOLITE   11/1999   Normal/ negative   TEAR DUCT PROBING  2005   ? side   TEE WITHOUT CARDIOVERSION N/A 02/16/2013   Procedure: TRANSESOPHAGEAL ECHOCARDIOGRAM (TEE);  Surgeon: Maude JAYSON Emmer, MD;  Location: [facility] ENDOSCOPY;  Service: Cardiovascular;  Laterality: N/A;   TUBAL LIGATION     BTL   Social History   Tobacco Use   Smoking status: Never   Smokeless tobacco: Never  Vaping Use   Vaping status: Never Used  Substance Use Topics   Alcohol use: Never    Alcohol/week: 0.0 standard drinks of alcohol   Drug use: Never   Family History  Problem Relation Age of Onset   Lung cancer Brother    Diabetes Brother    Pancreatic cancer Brother    Heart disease Mother    Heart disease Father    Brain cancer Other    Skin cancer Daughter    Diabetes Sister    Breast cancer Sister    Colon cancer Neg Hx    Allergies  Allergen Reactions   Bee Venom Hives, Shortness Of Breath and Swelling   Nabumetone Anaphylaxis   Amoxicillin-Pot Clavulanate Hives and Swelling    To lips.   Aspirin Hives   Atorvastatin Swelling     joint pain/swelling, inc liver tests   Clopidogrel Bisulfate Hives   Codeine Nausea And Vomiting   Ezetimibe Other (See Comments)     fatigue   Metformin  And Related Other (See Comments)    Diarrhea    Valsartan Other (See Comments)     fatigue   Crestor  [Rosuvastatin ]     Joint and muscle pain    Farxiga  [Dapagliflozin ]     Perineal discomfort  Increased urinary incontinence Fatigue  Joint pain    Jardiance  [Empagliflozin ]     Did not tolerate   Other Hives    **Red Meat**  SOB   Current Outpatient Medications on File Prior to Visit  Medication Sig Dispense Refill   amLODipine  (NORVASC ) 2.5 MG tablet TAKE 1 TABLET(2.5 MG) BY MOUTH EVERY DAY 90 tablet 1   blood glucose meter kit and supplies  Dispense based on patient and insurance preference. Use up to four times daily as directed. (FOR ICD-10 E10.9, E11.9). 1 each 0   Blood Glucose Monitoring Suppl (ONE TOUCH ULTRA 2) w/Device KIT Use to check blood sugar once daily 1 each 0   Cyanocobalamin  (VITAMIN B-12 PO) Take 1 tablet by mouth daily.     diphenhydrAMINE  (BENADRYL ) 25 MG tablet Take 50 mg by mouth every 8 (eight) hours as needed for allergies.      ELIQUIS  5 MG TABS tablet TAKE 1 TABLET(5 MG) BY MOUTH TWICE DAILY 60 tablet 5   EPINEPHrine  (EPI-PEN) 0.3 mg/0.3 mL DEVI Inject 0.3 mg into the muscle daily as needed (allergic reaction).     fluticasone (FLONASE) 50 MCG/ACT nasal spray Place 2 sprays into both nostrils 2 (two) times daily as needed. Reported on 09/10/2015     furosemide  (LASIX ) 20 MG tablet Take 1 tablet (20 mg total) by mouth every other day. 90 tablet 3   glipiZIDE  (GLUCOTROL  XL) 10 MG 24 hr tablet Take 1 tablet (10 mg total) by mouth daily with breakfast. 90 tablet 3   glucose blood (ONETOUCH ULTRA) test strip USE TO CHECK BLOOD SUGAR ONCE DAILY AS DIRECTED 100 strip 1   isosorbide  mononitrate (IMDUR ) 60 MG 24 hr tablet TAKE 1 TABLET(60 MG) BY MOUTH DAILY 90 tablet 1   metFORMIN  (GLUCOPHAGE -XR) 500 MG 24 hr tablet TAKE 1 TABLET(500 MG) BY MOUTH DAILY WITH BREAKFAST 90 tablet 3   nitroGLYCERIN  (NITROSTAT ) 0.4 MG SL tablet Place 1 tablet (0.4 mg total) under the tongue every 5 (five) minutes as needed for chest pain (up to 3 doses. If taking 3rd dose call 911). 15 tablet 1   propranolol  (INDERAL ) 10 MG tablet Take 1 tablet (10 mg) by mouth every 1 hour x 3 doses as needed for chest pain/ fast heart rates 30 tablet 1   terconazole  (TERAZOL 3 ) 0.8 % vaginal cream Use externally daily as needed for yeast infection symptoms 20 g 0   Current  Facility-Administered Medications on File Prior to Visit  Medication Dose Route Frequency Provider Last Rate Last Admin   Study - ORION 4 - inclisiran 300 mg/1.16mL or placebo SQ  injection (PI-[facility])  300 mg Subcutaneous Q6 months Morris Debby BIRCH, MD        Review of Systems  Constitutional:  Positive for fatigue. Negative for activity change, appetite change, fever and unexpected weight change.  HENT:  Negative for congestion, ear pain, rhinorrhea, sinus pressure and sore throat.   Eyes:  Negative for pain, redness and visual disturbance.  Respiratory:  Negative for cough, shortness of breath and wheezing.   Cardiovascular:  Negative for chest pain and palpitations.  Gastrointestinal:  Negative for abdominal pain, blood in stool, constipation and diarrhea.  Endocrine: Negative for polydipsia and polyuria.  Genitourinary:  Negative for dysuria, frequency and urgency.  Musculoskeletal:  Positive for back pain. Negative for arthralgias and myalgias.  Skin:  Negative for pallor and rash.  Allergic/Immunologic: Negative for environmental allergies.  Neurological:  Positive for weakness. Negative for dizziness, syncope and headaches.  Hematological:  Negative for adenopathy. Does not bruise/bleed easily.  Psychiatric/Behavioral:  Negative for decreased concentration and dysphoric mood. The patient is not nervous/anxious.        Objective:   Physical Exam Constitutional:      General: She is not in acute distress.    Appearance: Normal appearance. She is obese. She is not ill-appearing or diaphoretic.  Eyes:     Conjunctiva/sclera: Conjunctivae normal.     Pupils: Pupils are equal, round, and reactive to light.  Cardiovascular:     Rate and Rhythm: Normal rate.  Pulmonary:     Effort: Pulmonary effort is normal. No respiratory distress.     Breath sounds: No rales.  Musculoskeletal:     Lumbar back: Spasms, tenderness and bony tenderness present. No swelling, edema or signs of trauma. Decreased range of motion. Negative right straight leg raise test and negative left straight leg raise test.     Right lower leg: No edema.     Left lower leg: No edema.      Comments: Mild mid LS tenderness  Loss of lordosis  Left lumbar muscular tenderness with mild spasm  Worse pain with flexion and left lateral bend   Skin:    Coloration: Skin is not pale.     Findings: No erythema or rash.  Neurological:     Mental Status: She is alert.     Sensory: No sensory deficit.     Motor: Weakness present.     Deep Tendon Reflexes: Reflexes normal.     Comments: Left toe raise 4/5   Patient has difficulty raising either leg for slr due to generalized weakness  Psychiatric:        Mood and Affect: Mood normal.           Assessment & Plan:   Problem List Items Addressed This Visit       Other   BACK PAIN, LUMBAR - Primary   This time worse on left , worse with standing and prolonged walking  For 1-2 months  Some LLE (toe ext) weakness , but also generalized weakness  Suspect core weakness   Xray notes mild/mod spondylosis of lumbar spine with some disk changes at L5-S1   Suspect PT would be helpful  Low threshold for MRI if not improved   Call back and Er precautions noted in detail today  Relevant Orders   DG Lumbar Spine 2-3 Views (Completed)

## 2024-05-11 NOTE — Patient Instructions (Addendum)
 If you back aches you can try some heat for 10 minutes at a time   Stretching may help  Tylenol  would be ok also   Xray now  We will reach out with results and make a plan   If symptoms suddenly worsen in the meantime let us  know

## 2024-05-11 NOTE — Assessment & Plan Note (Signed)
 This time worse on left , worse with standing and prolonged walking  For 1-2 months  Some LLE (toe ext) weakness , but also generalized weakness  Suspect core weakness   Xray notes mild/mod spondylosis of lumbar spine with some disk changes at L5-S1   Suspect PT would be helpful  Low threshold for MRI if not improved   Call back and Er precautions noted in detail today

## 2024-05-12 NOTE — Telephone Encounter (Signed)
 Copied from CRM [id number]. Topic: Clinical - Lab/Test Results >> May 12, 2024 10:43 AM Margaret Vazquez wrote: Reason for CRM: Pt is calling back in regards to missed call and VM from [place], Called [facility] was busy with a pt. Please call pt when available.

## 2024-06-29 ENCOUNTER — Other Ambulatory Visit: Payer: Self-pay | Admitting: Student

## 2024-06-29 MED ORDER — ISOSORBIDE MONONITRATE ER 60 MG PO TB24: ORAL_TABLET | ORAL | 2 refills | Status: AC

## 2024-08-03 ENCOUNTER — Telehealth: Payer: Self-pay | Admitting: *Deleted

## 2024-08-03 NOTE — Telephone Encounter (Signed)
 Lmtcb FOR oRION 4 TRIAL

## 2024-08-04 ENCOUNTER — Encounter: Payer: Self-pay | Admitting: *Deleted

## 2024-08-04 DIAGNOSIS — Z006 Encounter for examination for normal comparison and control in clinical research program: Secondary | ICD-10-CM

## 2024-08-04 NOTE — Research (Signed)
 Orion 4   Phone call only and chart review  Patient doing well.  Staying active as she can since her husband has passed away.  Not on any statins as she just couldn't handle them.  No IP  Will call back July 28th for her last visit.   Suzen Hardy :) RN BSN  Clinical Research Nurse  Be strong and take heart, all you who hope in the [place]. ~ Psalm 31:24

## 2024-08-11 ENCOUNTER — Ambulatory Visit: Admitting: Podiatry

## 2024-08-11 ENCOUNTER — Encounter: Payer: Self-pay | Admitting: Podiatry

## 2024-08-11 NOTE — Progress Notes (Addendum)
 1. Failure to attend appointment with reason given    Patient rescheduled for later time slot today. No charge.

## 2024-10-19 ENCOUNTER — Ambulatory Visit: Admitting: Family Medicine

## 2024-11-07 ENCOUNTER — Ambulatory Visit: Admitting: Podiatry

## 2025-04-05 ENCOUNTER — Ambulatory Visit
